# Patient Record
Sex: Female | Born: 1950 | Race: White | Hispanic: No | Smoking: Never smoker
Health system: Southern US, Community
[De-identification: ages and names within clinical notes are randomized; demographics above are authoritative.]

## PROBLEM LIST (undated history)

## (undated) DIAGNOSIS — J3089 Other allergic rhinitis: Secondary | ICD-10-CM

## (undated) DIAGNOSIS — G5600 Carpal tunnel syndrome, unspecified upper limb: Secondary | ICD-10-CM

## (undated) DIAGNOSIS — Z87442 Personal history of urinary calculi: Secondary | ICD-10-CM

## (undated) DIAGNOSIS — E0590 Thyrotoxicosis, unspecified without thyrotoxic crisis or storm: Secondary | ICD-10-CM

## (undated) DIAGNOSIS — R0609 Other forms of dyspnea: Secondary | ICD-10-CM

## (undated) DIAGNOSIS — N3281 Overactive bladder: Secondary | ICD-10-CM

## (undated) DIAGNOSIS — N393 Stress incontinence (female) (male): Secondary | ICD-10-CM

## (undated) DIAGNOSIS — N8110 Cystocele, unspecified: Secondary | ICD-10-CM

## (undated) DIAGNOSIS — Z9189 Other specified personal risk factors, not elsewhere classified: Secondary | ICD-10-CM

## (undated) DIAGNOSIS — M75122 Complete rotator cuff tear or rupture of left shoulder, not specified as traumatic: Secondary | ICD-10-CM

## (undated) DIAGNOSIS — E785 Hyperlipidemia, unspecified: Secondary | ICD-10-CM

## (undated) DIAGNOSIS — C4491 Basal cell carcinoma of skin, unspecified: Secondary | ICD-10-CM

## (undated) DIAGNOSIS — G8929 Other chronic pain: Secondary | ICD-10-CM

## (undated) DIAGNOSIS — Z9889 Other specified postprocedural states: Secondary | ICD-10-CM

## (undated) DIAGNOSIS — Z860100 Personal history of colon polyps, unspecified: Secondary | ICD-10-CM

## (undated) DIAGNOSIS — I10 Essential (primary) hypertension: Secondary | ICD-10-CM

## (undated) DIAGNOSIS — C801 Malignant (primary) neoplasm, unspecified: Secondary | ICD-10-CM

## (undated) DIAGNOSIS — I509 Heart failure, unspecified: Secondary | ICD-10-CM

## (undated) DIAGNOSIS — G5603 Carpal tunnel syndrome, bilateral upper limbs: Secondary | ICD-10-CM

## (undated) DIAGNOSIS — R112 Nausea with vomiting, unspecified: Secondary | ICD-10-CM

## (undated) DIAGNOSIS — G43909 Migraine, unspecified, not intractable, without status migrainosus: Secondary | ICD-10-CM

## (undated) DIAGNOSIS — I499 Cardiac arrhythmia, unspecified: Secondary | ICD-10-CM

## (undated) DIAGNOSIS — I428 Other cardiomyopathies: Secondary | ICD-10-CM

## (undated) DIAGNOSIS — K219 Gastro-esophageal reflux disease without esophagitis: Secondary | ICD-10-CM

## (undated) DIAGNOSIS — N390 Urinary tract infection, site not specified: Secondary | ICD-10-CM

## (undated) DIAGNOSIS — J189 Pneumonia, unspecified organism: Secondary | ICD-10-CM

## (undated) DIAGNOSIS — D649 Anemia, unspecified: Secondary | ICD-10-CM

## (undated) DIAGNOSIS — T7840XA Allergy, unspecified, initial encounter: Secondary | ICD-10-CM

## (undated) DIAGNOSIS — I5020 Unspecified systolic (congestive) heart failure: Secondary | ICD-10-CM

## (undated) DIAGNOSIS — N952 Postmenopausal atrophic vaginitis: Secondary | ICD-10-CM

## (undated) DIAGNOSIS — K5790 Diverticulosis of intestine, part unspecified, without perforation or abscess without bleeding: Secondary | ICD-10-CM

## (undated) DIAGNOSIS — I2510 Atherosclerotic heart disease of native coronary artery without angina pectoris: Secondary | ICD-10-CM

## (undated) DIAGNOSIS — M1990 Unspecified osteoarthritis, unspecified site: Secondary | ICD-10-CM

## (undated) DIAGNOSIS — I4891 Unspecified atrial fibrillation: Secondary | ICD-10-CM

## (undated) DIAGNOSIS — M7582 Other shoulder lesions, left shoulder: Secondary | ICD-10-CM

## (undated) DIAGNOSIS — K449 Diaphragmatic hernia without obstruction or gangrene: Secondary | ICD-10-CM

## (undated) DIAGNOSIS — C4492 Squamous cell carcinoma of skin, unspecified: Secondary | ICD-10-CM

## (undated) DIAGNOSIS — R0600 Dyspnea, unspecified: Secondary | ICD-10-CM

## (undated) DIAGNOSIS — N814 Uterovaginal prolapse, unspecified: Secondary | ICD-10-CM

## (undated) HISTORY — PX: HYSTEROSCOPY: SHX211

## (undated) HISTORY — PX: OOPHORECTOMY: SHX86

## (undated) HISTORY — DX: Personal history of colon polyps, unspecified: Z86.0100

## (undated) HISTORY — PX: EYE SURGERY: SHX253

## (undated) HISTORY — DX: Essential (primary) hypertension: I10

## (undated) HISTORY — PX: CATARACT EXTRACTION: SUR2

## (undated) HISTORY — DX: Hyperlipidemia, unspecified: E78.5

## (undated) HISTORY — PX: CARPAL TUNNEL RELEASE: SHX101

## (undated) HISTORY — DX: Personal history of colonic polyps: Z86.010

## (undated) HISTORY — DX: Migraine, unspecified, not intractable, without status migrainosus: G43.909

## (undated) HISTORY — PX: OTHER SURGICAL HISTORY: SHX169

## (undated) HISTORY — PX: TRIGGER FINGER RELEASE: SHX641

## (undated) HISTORY — PX: TOTAL SHOULDER REPLACEMENT: SUR1217

## (undated) HISTORY — PX: TONSILLECTOMY: SUR1361

## (undated) HISTORY — DX: Allergy, unspecified, initial encounter: T78.40XA

## (undated) HISTORY — PX: FINGER SURGERY: SHX640

## (undated) HISTORY — PX: TUBAL LIGATION: SHX77

## (undated) HISTORY — PX: VAGINAL HYSTERECTOMY: SUR661

---

## 2004-08-28 HISTORY — PX: CHOLECYSTECTOMY: SHX55

## 2004-09-29 ENCOUNTER — Ambulatory Visit: Payer: Self-pay | Admitting: Unknown Physician Specialty

## 2004-11-15 ENCOUNTER — Ambulatory Visit: Payer: Self-pay | Admitting: Unknown Physician Specialty

## 2005-05-23 ENCOUNTER — Ambulatory Visit: Payer: Self-pay | Admitting: Unknown Physician Specialty

## 2005-06-14 ENCOUNTER — Ambulatory Visit: Payer: Self-pay | Admitting: General Surgery

## 2005-06-28 ENCOUNTER — Ambulatory Visit: Payer: Self-pay | Admitting: General Surgery

## 2005-10-24 ENCOUNTER — Ambulatory Visit: Payer: Self-pay | Admitting: Gastroenterology

## 2005-12-05 ENCOUNTER — Ambulatory Visit: Payer: Self-pay | Admitting: Unknown Physician Specialty

## 2005-12-14 ENCOUNTER — Ambulatory Visit: Payer: Self-pay | Admitting: Unknown Physician Specialty

## 2005-12-21 ENCOUNTER — Ambulatory Visit: Payer: Self-pay | Admitting: Unknown Physician Specialty

## 2006-12-12 ENCOUNTER — Ambulatory Visit: Payer: Self-pay | Admitting: Unknown Physician Specialty

## 2007-12-19 ENCOUNTER — Ambulatory Visit: Payer: Self-pay | Admitting: Unknown Physician Specialty

## 2008-12-29 ENCOUNTER — Ambulatory Visit: Payer: Self-pay | Admitting: Unknown Physician Specialty

## 2009-07-17 ENCOUNTER — Emergency Department: Payer: Self-pay | Admitting: Emergency Medicine

## 2010-01-11 ENCOUNTER — Ambulatory Visit: Payer: Self-pay | Admitting: Unknown Physician Specialty

## 2010-01-18 LAB — HM MAMMOGRAPHY

## 2010-10-31 ENCOUNTER — Ambulatory Visit: Payer: Self-pay | Admitting: Gastroenterology

## 2010-11-02 LAB — PATHOLOGY REPORT

## 2010-11-07 ENCOUNTER — Ambulatory Visit: Payer: Self-pay | Admitting: Unknown Physician Specialty

## 2010-11-18 ENCOUNTER — Ambulatory Visit: Payer: Self-pay | Admitting: Unknown Physician Specialty

## 2010-11-22 LAB — PATHOLOGY REPORT

## 2010-11-26 ENCOUNTER — Emergency Department: Payer: Self-pay | Admitting: Emergency Medicine

## 2011-01-19 ENCOUNTER — Ambulatory Visit: Payer: Self-pay | Admitting: Unknown Physician Specialty

## 2011-05-11 ENCOUNTER — Ambulatory Visit: Payer: Self-pay | Admitting: Unknown Physician Specialty

## 2011-07-11 LAB — LIPID PANEL
HDL: 58 mg/dL (ref 35–70)
LDL Cholesterol: 103 mg/dL
LDl/HDL Ratio: 3.1
Triglycerides: 98 mg/dL (ref 40–160)

## 2011-07-11 LAB — HEPATIC FUNCTION PANEL
Alkaline Phosphatase: 98 U/L (ref 25–125)
Bilirubin, Total: 0.4 mg/dL

## 2012-05-09 ENCOUNTER — Ambulatory Visit: Payer: Self-pay | Admitting: Family Medicine

## 2012-09-23 DIAGNOSIS — R339 Retention of urine, unspecified: Secondary | ICD-10-CM | POA: Insufficient documentation

## 2012-09-23 DIAGNOSIS — IMO0002 Reserved for concepts with insufficient information to code with codable children: Secondary | ICD-10-CM | POA: Insufficient documentation

## 2012-10-02 DIAGNOSIS — N3281 Overactive bladder: Secondary | ICD-10-CM | POA: Insufficient documentation

## 2012-10-02 DIAGNOSIS — N393 Stress incontinence (female) (male): Secondary | ICD-10-CM | POA: Insufficient documentation

## 2012-10-02 DIAGNOSIS — Z09 Encounter for follow-up examination after completed treatment for conditions other than malignant neoplasm: Secondary | ICD-10-CM | POA: Insufficient documentation

## 2012-10-02 DIAGNOSIS — G43909 Migraine, unspecified, not intractable, without status migrainosus: Secondary | ICD-10-CM | POA: Insufficient documentation

## 2012-10-18 HISTORY — PX: COLPOPEXY: SHX920

## 2012-10-18 HISTORY — PX: CYSTOURETHROSCOPY: SHX476

## 2012-10-18 HISTORY — PX: ANTERIOR AND POSTERIOR VAGINAL REPAIR W/ SACROSPINOUS LIGAMENT SUSPENSION: SUR6

## 2012-11-05 ENCOUNTER — Encounter: Payer: Self-pay | Admitting: Internal Medicine

## 2012-11-08 ENCOUNTER — Telehealth: Payer: Self-pay | Admitting: Internal Medicine

## 2012-11-08 ENCOUNTER — Encounter: Payer: Self-pay | Admitting: Internal Medicine

## 2012-11-08 ENCOUNTER — Ambulatory Visit (INDEPENDENT_AMBULATORY_CARE_PROVIDER_SITE_OTHER): Payer: BC Managed Care – PPO | Admitting: Internal Medicine

## 2012-11-08 ENCOUNTER — Encounter: Payer: Self-pay | Admitting: *Deleted

## 2012-11-08 VITALS — BP 102/70 | HR 84 | Temp 97.8°F | Ht 61.5 in | Wt 152.0 lb

## 2012-11-08 DIAGNOSIS — Z86010 Personal history of colonic polyps: Secondary | ICD-10-CM

## 2012-11-08 DIAGNOSIS — E7800 Pure hypercholesterolemia, unspecified: Secondary | ICD-10-CM

## 2012-11-08 LAB — COMPREHENSIVE METABOLIC PANEL
AST: 20 U/L (ref 0–37)
Albumin: 3.7 g/dL (ref 3.5–5.2)
Alkaline Phosphatase: 100 U/L (ref 39–117)
Chloride: 107 mEq/L (ref 96–112)
Potassium: 4.3 mEq/L (ref 3.5–5.1)
Sodium: 140 mEq/L (ref 135–145)
Total Protein: 7 g/dL (ref 6.0–8.3)

## 2012-11-08 LAB — CBC WITH DIFFERENTIAL/PLATELET
Eosinophils Absolute: 0.3 10*3/uL (ref 0.0–0.7)
Eosinophils Relative: 3.5 % (ref 0.0–5.0)
Lymphocytes Relative: 24.1 % (ref 12.0–46.0)
MCHC: 33.3 g/dL (ref 30.0–36.0)
MCV: 83.3 fl (ref 78.0–100.0)
Monocytes Absolute: 0.6 10*3/uL (ref 0.1–1.0)
Neutrophils Relative %: 65.8 % (ref 43.0–77.0)
Platelets: 306 10*3/uL (ref 150.0–400.0)
WBC: 9.9 10*3/uL (ref 4.5–10.5)

## 2012-11-08 LAB — LIPID PANEL
HDL: 43.4 mg/dL (ref >39.00)
LDL Cholesterol: 101 mg/dL — ABNORMAL HIGH (ref 0–99)
VLDL: 29 mg/dL (ref 0.0–40.0)

## 2012-11-08 NOTE — Telephone Encounter (Signed)
Noted  

## 2012-11-08 NOTE — Telephone Encounter (Signed)
At check-out pt wanted to make sure that on her medical release form Dr. Lorin Picket added [facility].  Pt states only Dr. Quillian Quince was listed on it and she needs records from [facility] as well.

## 2012-11-09 ENCOUNTER — Encounter: Payer: Self-pay | Admitting: Internal Medicine

## 2012-11-11 ENCOUNTER — Encounter: Payer: Self-pay | Admitting: Internal Medicine

## 2012-11-11 DIAGNOSIS — J309 Allergic rhinitis, unspecified: Secondary | ICD-10-CM | POA: Insufficient documentation

## 2012-11-11 NOTE — Progress Notes (Signed)
  Subjective:    [name], female    DOB: 02/13/1951, 62 y.o.   MRN: [id number]  HPI 62 year old female with past history of documented hypertension, hypercholesterolemia, allergic rhinitis and migraine headaches.  She comes in today to follow up on these issues as well as for a complete physical exam.  She was a former pt of Dr Lin Givens.  Has been seeing Dr Quillian Quince recently.  Had her physical 03/19/12.  Is seeing Dr Oliva Bustard.  Had a recent vaginal hysterectomy and bladder tack.  Ovaries left in place.  Using estrace cream.  Has follow up planned 11/25/12.  Takes a stool softener daily.  Also takes metamucil prn.  Bowels do relatively well on this regimen.  She does have a history of migraine headaches.  Has been to the headache clinic.  Stable.  Sees Dr Bluford Kaufmann.  Had a colonoscopy 01/30/12.  Has a history of colon polyps.  Had a stress echo recently.  States everything checked out fine.  Had her mammogram 05/09/12.     Past Medical History  Diagnosis Date  . Hyperlipidemia   . Migraines   . Hypertension   . Allergy   . Personal history of colonic polyps     Review of Systems Patient denies any lightheadedness or dizziness.  Has a history of migraine headaches as outlined.  Feels these are stable.   No chest pain, tightness or palpitations.  No increased shortness of breath, cough or congestion.  No nausea or vomiting.  No acid reflux.  No abdominal pain or cramping.  No bowel change, such as diarrhea,  BRBPR or melana.  Bowels stable on current regimen.  No urine change.  No vaginal issues.  Did well with her surgery.  Using estrace cream.      Objective:   Physical Exam 62 year old female in no acute distress.   HEENT:  Nares- clear.  Oropharynx - without lesions. NECK:  Supple.  Nontender.  No audible bruit.  HEART:  Appears to be regular. LUNGS:  No crackles or wheezing audible.  Respirations even and unlabored.  RADIAL PULSE:  Equal bilaterally.   ABDOMEN:  Soft,  nontender.  Bowel sounds present and normal.  No audible abdominal bruit.   EXTREMITIES:  No increased edema present.  DP pulses palpable and equal bilaterally.      NEURO:  No focal neurological deficits noted.  SKIN:  No rash.       Assessment & Plan:  DOCUMENTED HISTORY OF HYPERTENSION.  Pt denies.  Follow.  Check metabolic panel.   GYN.  Is s/p vaginal hysterectomy and bladder sling.  Doing well.  Continue to follow up with Dr Varney Baas.   CARDIOVASCULAR.  Asymptomatic.    HEALTH MAINTENANCE.  Had her physical in 7/13 (03/19/12).  Mammogram 05/09/12.  Colonoscopy 01/30/12.  Shingles vaccine 4/13.

## 2012-11-11 NOTE — Assessment & Plan Note (Signed)
On simvastatin.  Low cholesterol diet and exercise.  Check lipid panel and liver function.  

## 2012-11-11 NOTE — Assessment & Plan Note (Signed)
[facility]ed up at [facility].  Doing well on her current regimen.  Follow.   

## 2012-11-11 NOTE — Assessment & Plan Note (Signed)
Stable on current regimen.  Follow.   

## 2012-11-11 NOTE — Assessment & Plan Note (Signed)
Had a colonoscopy 01/30/12.  Per Dr Oh - due follow up 01/29/17.    

## 2012-11-12 ENCOUNTER — Telehealth: Payer: Self-pay | Admitting: *Deleted

## 2012-11-12 NOTE — Telephone Encounter (Signed)
Updated patients record

## 2012-11-12 NOTE — Telephone Encounter (Signed)
Patient info enter into record.

## 2012-12-02 ENCOUNTER — Encounter: Payer: Self-pay | Admitting: Internal Medicine

## 2012-12-03 ENCOUNTER — Telehealth: Payer: Self-pay | Admitting: Internal Medicine

## 2012-12-03 NOTE — Telephone Encounter (Signed)
Pt sent me a my  Chart message regarding getting a tetanus at her 03/13/13 appt.  Do we always have tetanus here.  If so, let her know - she should be able to get at her appt.  Thanks.

## 2012-12-03 NOTE — Telephone Encounter (Signed)
Patient notified

## 2012-12-21 ENCOUNTER — Encounter: Payer: Self-pay | Admitting: Internal Medicine

## 2012-12-23 ENCOUNTER — Telehealth: Payer: Self-pay | Admitting: Internal Medicine

## 2012-12-23 NOTE — Telephone Encounter (Signed)
I am ok to give her scopolamine patch (change q 72 hours) One box with no refills - if she desires.  Regarding her arm, I do not give injections in small joints.  If having pain and possibly needing injection - can set her up with ortho.  Thanks.

## 2012-12-24 MED ORDER — SCOPOLAMINE 1 MG/3DAYS TD PT72: 1.0000 | MEDICATED_PATCH | TRANSDERMAL | Status: DC

## 2012-12-24 NOTE — Telephone Encounter (Signed)
Rx sent in electronically, & pt would like to hold off on a Ortho referral. Pt also instructed to wash hands after each change.

## 2013-01-02 ENCOUNTER — Encounter: Payer: Self-pay | Admitting: Internal Medicine

## 2013-01-03 ENCOUNTER — Ambulatory Visit (INDEPENDENT_AMBULATORY_CARE_PROVIDER_SITE_OTHER): Payer: BC Managed Care – PPO | Admitting: Internal Medicine

## 2013-01-03 ENCOUNTER — Encounter: Payer: Self-pay | Admitting: Internal Medicine

## 2013-01-03 ENCOUNTER — Telehealth: Payer: Self-pay | Admitting: Internal Medicine

## 2013-01-03 VITALS — BP 120/80 | HR 74 | Temp 97.6°F | Ht 61.5 in | Wt 156.8 lb

## 2013-01-03 DIAGNOSIS — N390 Urinary tract infection, site not specified: Secondary | ICD-10-CM

## 2013-01-03 LAB — POCT URINALYSIS DIPSTICK
Bilirubin, UA: NEGATIVE
Ketones, UA: NEGATIVE

## 2013-01-03 MED ORDER — NITROFURANTOIN MONOHYD MACRO 100 MG PO CAPS: 100.0000 mg | ORAL_CAPSULE | Freq: Two times a day (BID) | ORAL | Status: DC

## 2013-01-03 NOTE — Telephone Encounter (Signed)
She can come in at 12:15 - give urine sample first and then will need to be seen.  Working in for this problem.

## 2013-01-03 NOTE — Telephone Encounter (Signed)
Patient is coming in at 12:15 and she knows she has to be seen by the provider as well.

## 2013-01-03 NOTE — Progress Notes (Signed)
  Subjective:    [name], female    DOB: 04/05/1951, 62 y.o.   MRN: [id number]  Urinary Frequency  Associated symptoms include frequency.   61 year old female with past history of documented hypertension, hypercholesterolemia, allergic rhinitis and migraine headaches.  She comes in today for an acute visit with concerns regarding a possible urinary tract infection.  Has seen Dr Oliva Bustard.  Had a recent vaginal hysterectomy and bladder tack.  Ovaries left in place.  She reports that two days ago, she noticed a little pink with wiping - after urination.  She also reports increased urinary frequency and increased pressure.  No back pain and no abdominal pain.  No nausea or vomiting.  No fever.  No vaginal discharge or itching.  Getting ready to fly.  Went on a trip last week as well.  Noticed that she felt a little more pressure in her left ear - with the flight.  No ear ache or increased ear pain.  No increased sinus congestion or drainage.      Past Medical History  Diagnosis Date  . Hyperlipidemia   . Migraines   . Hypertension   . Allergy   . Personal history of colonic polyps     Review of Systems  Genitourinary: Positive for frequency.  no sinus pressure or ear ache or fullness now.  No acute hearing change.  Does report the urinary symptoms as outlined.  No vaginal discharge.  No further hematuria.  No abdominal pain or cramping.  Does report some pressure.  No bowel change, such as diarrhea.      Objective:   Physical Exam  62 year old female in no acute distress.   HEENT:  Nares- clear.  Oropharynx - without lesions.  Right TM clear.  Left TM - no erythema.  Question of scarring.  No change with hearing on gross exam.   NECK:  Supple.  Nontender.    HEART:  Appears to be regular. LUNGS:  No crackles or wheezing audible.  Respirations even and unlabored.  ABDOMEN:  Soft, nontender.  Bowel sounds present and normal.  No audible abdominal bruit.   BACK:   Non tender.  No CVA tenderness.       Assessment & Plan:  UTI.  Urine dip has findings c/w a possible uti.  Will treat with macrobid 100mg  bid x 5 days.  Stay hydrated.  Follow.   GYN.  Is s/p vaginal hysterectomy and bladder sling.  Doing well.  Continue to follow up with Dr Varney Baas.   UPCOMING FLIGHT.  No symptoms now.  Ear changes as outlined.  Will have her use Flonase and afrin nasal spray as directed.  Follow.   HEALTH MAINTENANCE.  Had her physical in 7/13 (03/19/12).  Mammogram 05/09/12.  Colonoscopy 01/30/12.  Shingles vaccine 4/13.

## 2013-01-05 LAB — URINE CULTURE: Colony Count: >=100000

## 2013-01-06 ENCOUNTER — Encounter: Payer: Self-pay | Admitting: *Deleted

## 2013-01-06 ENCOUNTER — Other Ambulatory Visit: Payer: Self-pay | Admitting: Internal Medicine

## 2013-01-06 DIAGNOSIS — R319 Hematuria, unspecified: Secondary | ICD-10-CM

## 2013-01-06 NOTE — Progress Notes (Signed)
Order placed for f/u urinalysis 

## 2013-01-17 ENCOUNTER — Other Ambulatory Visit (INDEPENDENT_AMBULATORY_CARE_PROVIDER_SITE_OTHER): Payer: BC Managed Care – PPO

## 2013-01-17 DIAGNOSIS — R319 Hematuria, unspecified: Secondary | ICD-10-CM

## 2013-01-17 LAB — URINALYSIS, ROUTINE W REFLEX MICROSCOPIC
Nitrite: NEGATIVE
Urobilinogen, UA: 0.2 (ref 0.0–1.0)

## 2013-01-20 ENCOUNTER — Encounter: Payer: Self-pay | Admitting: Internal Medicine

## 2013-02-03 ENCOUNTER — Encounter: Payer: Self-pay | Admitting: Internal Medicine

## 2013-02-06 ENCOUNTER — Encounter: Payer: Self-pay | Admitting: Internal Medicine

## 2013-02-06 ENCOUNTER — Ambulatory Visit (INDEPENDENT_AMBULATORY_CARE_PROVIDER_SITE_OTHER): Payer: BC Managed Care – PPO | Admitting: Internal Medicine

## 2013-02-06 VITALS — BP 110/80 | HR 79 | Temp 97.7°F | Ht 61.5 in | Wt 154.5 lb

## 2013-02-06 DIAGNOSIS — R350 Frequency of micturition: Secondary | ICD-10-CM

## 2013-02-06 MED ORDER — NITROFURANTOIN MONOHYD MACRO 100 MG PO CAPS: 100.0000 mg | ORAL_CAPSULE | Freq: Two times a day (BID) | ORAL | Status: DC

## 2013-02-10 LAB — WET PREP BY MOLECULAR PROBE
Candida species: NEGATIVE
Gardnerella vaginalis: NEGATIVE
Trichomonas vaginosis: NEGATIVE

## 2013-02-12 ENCOUNTER — Telehealth: Payer: Self-pay | Admitting: Internal Medicine

## 2013-02-12 ENCOUNTER — Other Ambulatory Visit: Payer: Self-pay | Admitting: Internal Medicine

## 2013-02-12 ENCOUNTER — Encounter: Payer: Self-pay | Admitting: Internal Medicine

## 2013-02-12 DIAGNOSIS — R109 Unspecified abdominal pain: Secondary | ICD-10-CM

## 2013-02-12 DIAGNOSIS — R319 Hematuria, unspecified: Secondary | ICD-10-CM

## 2013-02-12 DIAGNOSIS — R350 Frequency of micturition: Secondary | ICD-10-CM

## 2013-02-12 NOTE — Progress Notes (Signed)
Subjective:    [name], female    DOB: [birth year], 62 y.o.   MRN: [id number]  Urinary Tract Infection  Associated symptoms include frequency.  Urinary Frequency  Associated symptoms include frequency.   62 year old female with past history of documented hypertension, hypercholesterolemia, allergic rhinitis and migraine headaches.  She comes in today for an acute visit with concerns regarding a possible urinary tract infection.  Has seen Dr Oliva Bustard.  Had a recent vaginal hysterectomy and bladder tack.  Ovaries left in place.  She reports that three days ago, she noticed increased urinary frequency.  Some lower abdominal pressure.  No nausea or vomiting.  No fever.  Some nocturia.  No vaginal discharge.       Past Medical History  Diagnosis Date  . Hyperlipidemia   . Migraines   . Hypertension   . Allergy   . Personal history of colonic polyps     Current Outpatient Prescriptions on File Prior to Visit  Medication Sig Dispense Refill  . calcium carbonate (OS-CAL) 600 MG TABS Take 600 mg by mouth daily.      Jennette Banker Sodium 30-100 MG CAPS Take 100 mg by mouth 2 (two) times daily.      . cholecalciferol (VITAMIN D) 1000 UNITS tablet Take 1,000 Units by mouth daily.      Marland Kitchen erythromycin ophthalmic ointment at bedtime.      Marland Kitchen estradiol (ESTRACE) 0.1 MG/GM vaginal cream Use as directed      . fexofenadine (ALLEGRA) 180 MG tablet Take 180 mg by mouth daily.      . Multiple Vitamins-Minerals ([employer]S SPECTRAVITE ADULT 50+) TABS Take 1 tablet by mouth daily.      . simvastatin (ZOCOR) 10 MG tablet Take 10 mg by mouth at bedtime.      . solifenacin (VESICARE) 10 MG tablet Take by mouth daily.      . SUMAtriptan (IMITREX) 100 MG tablet Take 100 mg by mouth every 2 (two) hours as needed for migraine (Take 1 tablet by mouth as directed at onset of headache, then may repeat in 2 hours if no relief.).      Marland Kitchen zonisamide (ZONEGRAN) 100 MG capsule Take 100 mg by  mouth daily. Take 2 capsules by mouth at bedtime      . ibuprofen (ADVIL,MOTRIN) 600 MG tablet Take 600 mg by mouth every 6 (six) hours as needed for pain.      Marland Kitchen scopolamine (TRANSDERM-SCOP) 1.5 MG Place 1 patch (1.5 mg total) onto the skin every 3 (three) days.  10 patch  0   No current facility-administered medications on file prior to visit.    Review of Systems  Genitourinary: Positive for frequency.  Does report the urinary symptoms as outlined.  Increased frequency and nocturia.  No vaginal discharge.  No hematuria.  Some lower abdominal pressure.  No bowel change, such as diarrhea.      Objective:   Physical Exam  62 year old female in no acute distress.    HEART:  Appears to be regular. LUNGS:  No crackles or wheezing audible.  Respirations even and unlabored.  ABDOMEN:  Soft, nontender.  Bowel sounds present and normal.  No audible abdominal bruit.   BACK:  Non tender.  No CVA tenderness.  GU:  Normal external genitalia.  Vaginal vault without lesions.  Could not appreciate any adnexal masses or tenderness.  KOH/wet prep sent.        Assessment & Plan:  UTI.  Urine dip revealed large leukocytes.  No blood.   Will treat with macrobid 100mg  bid x 5 days.  Stay hydrated.  Follow.    GYN.  Is s/p vaginal hysterectomy and bladder sling.  KOH/wet prep sent. Await results.  Given persistent intermittent symptoms, will refer back to see Dr Varney Baas for evaluation.   HEALTH MAINTENANCE.  Had her physical in 7/13 (03/19/12).  Mammogram 05/09/12.  Colonoscopy 01/30/12.  Shingles vaccine 4/13.

## 2013-02-12 NOTE — Progress Notes (Signed)
Order placed for f/u referral to Dr Higinio Roger.

## 2013-02-12 NOTE — Progress Notes (Signed)
Order placed for f/u urine and culture.  

## 2013-02-20 ENCOUNTER — Telehealth: Payer: Self-pay | Admitting: *Deleted

## 2013-02-20 ENCOUNTER — Other Ambulatory Visit (INDEPENDENT_AMBULATORY_CARE_PROVIDER_SITE_OTHER): Payer: BC Managed Care – PPO

## 2013-02-20 DIAGNOSIS — N390 Urinary tract infection, site not specified: Secondary | ICD-10-CM

## 2013-02-20 LAB — POCT URINALYSIS DIPSTICK
Nitrite, UA: NEGATIVE
Protein, UA: NEGATIVE
Spec Grav, UA: 1.025
Urobilinogen, UA: 0.2
pH, UA: 6.5

## 2013-02-20 NOTE — Telephone Encounter (Signed)
Patient came in today to drop off a urine specimen. She also dropped off a copy of her AVS from [facility] (Dr. Cathie Hoops). She wanted to inform you that she was taken off of her Vesicare & started on [place] XR (I have updated her medication list) Dr. Varney Baas would also like for [place] to forward any Urine test to her office. (Paperwork in Writer)

## 2013-02-20 NOTE — Telephone Encounter (Signed)
Please send her urine results to Dr Jabier Gauss.  See lab results.  Will add culture.

## 2013-02-21 NOTE — Telephone Encounter (Signed)
Faxed to Dr. Varney Baas office

## 2013-03-13 ENCOUNTER — Ambulatory Visit (INDEPENDENT_AMBULATORY_CARE_PROVIDER_SITE_OTHER): Payer: BC Managed Care – PPO | Admitting: Internal Medicine

## 2013-03-13 ENCOUNTER — Encounter: Payer: Self-pay | Admitting: Internal Medicine

## 2013-03-13 VITALS — BP 130/90 | HR 113 | Temp 98.3°F | Ht 61.0 in | Wt 159.0 lb

## 2013-03-13 DIAGNOSIS — J309 Allergic rhinitis, unspecified: Secondary | ICD-10-CM

## 2013-03-13 DIAGNOSIS — Z86010 Personal history of colonic polyps: Secondary | ICD-10-CM

## 2013-03-13 DIAGNOSIS — E7800 Pure hypercholesterolemia, unspecified: Secondary | ICD-10-CM

## 2013-03-13 DIAGNOSIS — Z8669 Personal history of other diseases of the nervous system and sense organs: Secondary | ICD-10-CM

## 2013-03-16 ENCOUNTER — Encounter: Payer: Self-pay | Admitting: Internal Medicine

## 2013-03-16 NOTE — Assessment & Plan Note (Signed)
Stable on current regimen.  Follow.   

## 2013-03-16 NOTE — Progress Notes (Signed)
Subjective:    [name], female    DOB: 1950-11-15, 62 y.o.   MRN: [id number]  HPI 62 year old female with past history of documented hypertension, hypercholesterolemia, allergic rhinitis and migraine headaches.  She comes in today to follow up on these issues as well as for a complete physical exam.   Is seeing Dr Oliva Bustard.  Had a recent vaginal hysterectomy and bladder tack.  Ovaries left in place.  Using estrace cream.  Has had some issues recently with urinary tract infections and vaginal/bladder discomfort.  Just evaluated by Dr Varney Baas.  Gave her exercises to do.  Takes a stool softener daily.  Also takes metamucil prn.  Bowels do relatively well on this regimen.  She does have a history of migraine headaches.  Has been to the headache clinic.  Stable.  Sees Dr Bluford Kaufmann.  Had a colonoscopy 01/30/12.  Has a history of colon polyps.  Had a stress echo recently.  States everything checked out fine.  Had her mammogram 05/09/12.  Overall she feels she is doing relatively well.     Past Medical History  Diagnosis Date  . Hyperlipidemia   . Migraines   . Hypertension   . Allergy   . Personal history of colonic polyps     Current Outpatient Prescriptions on File Prior to Visit  Medication Sig Dispense Refill  . calcium carbonate (OS-CAL) 600 MG TABS Take 600 mg by mouth daily.      Jennette Banker Sodium 30-100 MG CAPS Take 100 mg by mouth 2 (two) times daily.      . cholecalciferol (VITAMIN D) 1000 UNITS tablet Take 1,000 Units by mouth daily.      Marland Kitchen erythromycin ophthalmic ointment at bedtime.      Marland Kitchen estradiol (ESTRACE) 0.1 MG/GM vaginal cream Use as directed      . fexofenadine (ALLEGRA) 180 MG tablet Take 180 mg by mouth daily.      . Multiple Vitamins-Minerals (CVS SPECTRAVITE ADULT 50+) TABS Take 1 tablet by mouth daily.      . simvastatin (ZOCOR) 10 MG tablet Take 10 mg by mouth at bedtime.      . SUMAtriptan (IMITREX) 100 MG tablet Take 100 mg by mouth every  2 (two) hours as needed for migraine (Take 1 tablet by mouth as directed at onset of headache, then may repeat in 2 hours if no relief.).      . Trospium Chloride (SANCTURA XR) 60 MG CP24 Take by mouth daily before breakfast.      . zonisamide (ZONEGRAN) 100 MG capsule Take 100 mg by mouth daily. Take 2 capsules by mouth at bedtime       No current facility-administered medications on file prior to visit.    Review of Systems Patient denies any lightheadedness or dizziness.  Has a history of migraine headaches as outlined.  Feels these are stable.   No chest pain, tightness or palpitations.  No increased shortness of breath, cough or congestion.  No nausea or vomiting.  No acid reflux.  No abdominal pain or cramping.  No bowel change, such as diarrhea,  BRBPR or melana.  Bowels stable on current regimen.  No urine change now.  Previous infection symptoms resolved.   Having some pelvic issues.  Saw Dr Varney Baas.  Given exercises.  Using estrace cream.  Has followed up planned in 8/14.      Objective:   Physical Exam  Filed Vitals:   03/13/13 1334  BP: 130/90  Pulse: 113  Temp: 98.3 F (36.8 C)   Blood pressure recheck:  120/82, pulse 56  62 year old female in no acute distress.   HEENT:  Nares- clear.  Oropharynx - without lesions. NECK:  Supple.  Nontender.  No audible bruit.  HEART:  Appears to be regular. LUNGS:  No crackles or wheezing audible.  Respirations even and unlabored.  RADIAL PULSE:  Equal bilaterally.    BREASTS:  No nipple discharge or nipple retraction present.  Could not appreciate any distinct nodules or axillary adenopathy.  ABDOMEN:  Soft, nontender.  Bowel sounds present and normal.  No audible abdominal bruit.  GU:  Normal external genitalia.  Vaginal vault without lesions.  Some prolapse present.  Could not appreciate any adnexal masses or tenderness.   RECTAL:  Heme negative.   EXTREMITIES:  No increased edema present.  DP pulses palpable and equal bilaterally.           Assessment & Plan:  DOCUMENTED HISTORY OF HYPERTENSION.  Pt denies.  Blood pressure recheck 120/82.  Follow.   GYN.  Is s/p vaginal hysterectomy and bladder sling.  Continue to follow up with Dr Varney Baas.  Symptoms as outlined.  Was just reevaluated.  Given exercises.  Planning for f/u in 8/14.   CARDIOVASCULAR.  Asymptomatic.    HEALTH MAINTENANCE.  Physical today.   Mammogram 05/09/12.  Colonoscopy 01/30/12.  Shingles vaccine 4/13.

## 2013-03-16 NOTE — Assessment & Plan Note (Signed)
Had a colonoscopy 01/30/12.  Per Dr Oh - due follow up 01/29/17.    

## 2013-03-16 NOTE — Assessment & Plan Note (Signed)
On simvastatin.  Low cholesterol diet and exercise.  Follow lipid panel and liver function.      

## 2013-03-16 NOTE — Assessment & Plan Note (Signed)
[facility]ed up at [facility].  Doing well on her current regimen.  Follow.   

## 2013-04-04 ENCOUNTER — Other Ambulatory Visit: Payer: Self-pay | Admitting: *Deleted

## 2013-04-04 ENCOUNTER — Encounter: Payer: Self-pay | Admitting: Internal Medicine

## 2013-04-04 MED ORDER — SUMATRIPTAN SUCCINATE 100 MG PO TABS: 100.0000 mg | ORAL_TABLET | ORAL | Status: DC | PRN

## 2013-04-08 ENCOUNTER — Telehealth: Payer: Self-pay | Admitting: Internal Medicine

## 2013-04-08 MED ORDER — SUMATRIPTAN SUCCINATE 100 MG PO TABS: 100.0000 mg | ORAL_TABLET | ORAL | Status: DC | PRN

## 2013-04-08 NOTE — Telephone Encounter (Signed)
Faxed new Rx & sent pt a mychart message

## 2013-04-08 NOTE — Telephone Encounter (Signed)
Pt states sumatriptan was called in to [facility] but was only called for 1 month.  Pt states they usually get a year supply with 3 mth refills each time as they drive back and forth to [facility] for this.  Please contact pt once this has been corrected.

## 2013-04-16 ENCOUNTER — Telehealth: Payer: Self-pay | Admitting: Emergency Medicine

## 2013-04-16 NOTE — Telephone Encounter (Signed)
LVM for patient to call our office in reference to dermatology apt.

## 2013-05-06 ENCOUNTER — Other Ambulatory Visit: Payer: BC Managed Care – PPO

## 2013-05-09 ENCOUNTER — Encounter: Payer: Self-pay | Admitting: Internal Medicine

## 2013-05-09 ENCOUNTER — Other Ambulatory Visit: Payer: Self-pay | Admitting: *Deleted

## 2013-05-09 MED ORDER — SIMVASTATIN 10 MG PO TABS: 10.0000 mg | ORAL_TABLET | Freq: Every day | ORAL | Status: DC

## 2013-05-16 ENCOUNTER — Other Ambulatory Visit: Payer: Self-pay | Admitting: *Deleted

## 2013-05-16 MED ORDER — SUMATRIPTAN SUCCINATE 100 MG PO TABS: 100.0000 mg | ORAL_TABLET | ORAL | Status: DC | PRN

## 2013-05-19 ENCOUNTER — Other Ambulatory Visit: Payer: Self-pay | Admitting: *Deleted

## 2013-05-19 NOTE — Telephone Encounter (Signed)
Rx printed on 05/16/13 was not picked up (was printed in error)

## 2013-05-20 ENCOUNTER — Ambulatory Visit: Payer: Self-pay | Admitting: Internal Medicine

## 2013-05-20 LAB — HM MAMMOGRAPHY

## 2013-05-21 ENCOUNTER — Encounter: Payer: Self-pay | Admitting: Internal Medicine

## 2013-06-06 ENCOUNTER — Other Ambulatory Visit (INDEPENDENT_AMBULATORY_CARE_PROVIDER_SITE_OTHER): Payer: BC Managed Care – PPO

## 2013-06-06 DIAGNOSIS — E7800 Pure hypercholesterolemia, unspecified: Secondary | ICD-10-CM

## 2013-06-06 LAB — LIPID PANEL
Cholesterol: 159 mg/dL (ref 0–200)
LDL Cholesterol: 86 mg/dL (ref 0–99)

## 2013-06-06 LAB — HEPATIC FUNCTION PANEL
ALT: 18 U/L (ref 0–35)
Alkaline Phosphatase: 104 U/L (ref 39–117)
Bilirubin, Direct: 0 mg/dL (ref 0.0–0.3)
Total Protein: 7.3 g/dL (ref 6.0–8.3)

## 2013-06-07 ENCOUNTER — Encounter: Payer: Self-pay | Admitting: Internal Medicine

## 2013-07-03 ENCOUNTER — Other Ambulatory Visit: Payer: Self-pay

## 2013-08-18 ENCOUNTER — Ambulatory Visit (INDEPENDENT_AMBULATORY_CARE_PROVIDER_SITE_OTHER): Payer: BC Managed Care – PPO | Admitting: Internal Medicine

## 2013-08-18 ENCOUNTER — Encounter: Payer: Self-pay | Admitting: Internal Medicine

## 2013-08-18 VITALS — BP 130/80 | HR 90 | Temp 97.9°F | Ht 61.0 in | Wt 159.5 lb

## 2013-08-18 DIAGNOSIS — N390 Urinary tract infection, site not specified: Secondary | ICD-10-CM

## 2013-08-18 LAB — POCT URINALYSIS DIPSTICK
Bilirubin, UA: NEGATIVE
Glucose, UA: NEGATIVE
Protein, UA: NEGATIVE
Spec Grav, UA: 1.02
Urobilinogen, UA: 0.2

## 2013-08-18 MED ORDER — CIPROFLOXACIN HCL 500 MG PO TABS: 500.0000 mg | ORAL_TABLET | Freq: Two times a day (BID) | ORAL | Status: DC

## 2013-08-18 NOTE — Progress Notes (Signed)
Pre-visit discussion using our clinic review tool. No additional management support is needed unless otherwise documented below in the visit note.  

## 2013-08-18 NOTE — Telephone Encounter (Signed)
Left message, requesting pt to call back to confirm she can be seen at 12:15.

## 2013-08-18 NOTE — Telephone Encounter (Signed)
Pt called back and confirmed she can keep 12:15

## 2013-08-20 LAB — URINE CULTURE: Colony Count: >=100000

## 2013-08-22 ENCOUNTER — Encounter: Payer: Self-pay | Admitting: Internal Medicine

## 2013-08-22 DIAGNOSIS — N390 Urinary tract infection, site not specified: Secondary | ICD-10-CM | POA: Insufficient documentation

## 2013-08-22 NOTE — Assessment & Plan Note (Signed)
Symptoms and urinary symptoms c/w uti.  Treat with cipro as directed.  Stay hydrated.  Notify me or be reevaluated if symptoms change, worsen or do not resolve.

## 2013-08-22 NOTE — Progress Notes (Signed)
  Subjective:    [name], female    DOB: [birth year], 62 y.o.   MRN: [id number]  Urinary Tract Infection   62 year old female with past history of documented hypertension, hypercholesterolemia, allergic rhinitis and migraine headaches.  She comes in today as a work in with concerns regarding a possible urinary tract infection.  Had a recent vaginal hysterectomy and bladder tack.  Ovaries left in place.  States symptoms started two days ago.  Describes increased lower abdominal pressure.  Some minimal low back discomfort.  Cloudy urine.  No hematuria.  No vaginal discharge or fever.  Eating and drinking well.  No nausea or vomiting.      Past Medical History  Diagnosis Date  . Hyperlipidemia   . Migraines   . Hypertension   . Allergy   . Personal history of colonic polyps     Current Outpatient Prescriptions on File Prior to Visit  Medication Sig Dispense Refill  . calcium carbonate (OS-CAL) 600 MG TABS Take 600 mg by mouth daily.      Jennette Banker Sodium 30-100 MG CAPS Take 100 mg by mouth 2 (two) times daily.      . cholecalciferol (VITAMIN D) 1000 UNITS tablet Take 1,000 Units by mouth daily.      . Cranberry-Vitamin C-Probiotic (AZO CRANBERRY PO) Take by mouth.      . erythromycin ophthalmic ointment at bedtime.      Marland Kitchen estradiol (ESTRACE) 0.1 MG/GM vaginal cream Use as directed      . fexofenadine (ALLEGRA) 180 MG tablet Take 180 mg by mouth daily.      . Multiple Vitamins-Minerals (CVS SPECTRAVITE ADULT 50+) TABS Take 1 tablet by mouth daily.      . Probiotic Product (PROBIOTIC DAILY PO) Take by mouth.      . simvastatin (ZOCOR) 10 MG tablet Take 1 tablet (10 mg total) by mouth at bedtime.  30 tablet  5  . SUMAtriptan (IMITREX) 100 MG tablet Take 1 tablet (100 mg total) by mouth every 2 (two) hours as needed for migraine (Take 1 tablet by mouth as directed at onset of headache, then may repeat in 2 hours if no relief.).  30 tablet  0  . Trospium  Chloride (SANCTURA XR) 60 MG CP24 Take by mouth daily before breakfast.      . zonisamide (ZONEGRAN) 100 MG capsule Take 100 mg by mouth daily. Take 2 capsules by mouth at bedtime       No current facility-administered medications on file prior to visit.    Review of Systems No abdominal pain or cramping.  Does report the abdominal pressure.  No bowel change.  Bowels stable.  Urine cloudy.  Symptoms c/w uti symptoms.  No vaginal discharge.  No fever.  Eating and drinking well.       Objective:   Physical Exam  Filed Vitals:   08/18/13 1227  BP: 130/80  Pulse: 90  Temp: 97.9 F (57.87 C)   61 year old female in no acute distress.  HEART:  Appears to be regular. LUNGS:  No crackles or wheezing audible.  Respirations even and unlabored. ABDOMEN:  Soft, nontender.  Bowel sounds present and normal.   BACK:  Nnon tender.  No CVA tenderness.           Assessment & Plan:  GYN.  Is s/p vaginal hysterectomy and bladder sling.  Continue to follow up with Dr Varney Baas.

## 2013-09-17 ENCOUNTER — Other Ambulatory Visit (INDEPENDENT_AMBULATORY_CARE_PROVIDER_SITE_OTHER): Payer: BC Managed Care – PPO

## 2013-09-17 ENCOUNTER — Encounter: Payer: Self-pay | Admitting: Internal Medicine

## 2013-09-17 ENCOUNTER — Ambulatory Visit (INDEPENDENT_AMBULATORY_CARE_PROVIDER_SITE_OTHER): Payer: BC Managed Care – PPO | Admitting: Internal Medicine

## 2013-09-17 ENCOUNTER — Telehealth: Payer: Self-pay | Admitting: Internal Medicine

## 2013-09-17 ENCOUNTER — Ambulatory Visit (INDEPENDENT_AMBULATORY_CARE_PROVIDER_SITE_OTHER)
Admission: RE | Admit: 2013-09-17 | Discharge: 2013-09-17 | Disposition: A | Discharge status: Home / Self Care | Payer: BC Managed Care – PPO | Source: Ambulatory Visit | Attending: Internal Medicine | Admitting: Internal Medicine

## 2013-09-17 ENCOUNTER — Other Ambulatory Visit: Payer: Self-pay | Admitting: Internal Medicine

## 2013-09-17 ENCOUNTER — Ambulatory Visit
Admission: RE | Admit: 2013-09-17 | Discharge: 2013-09-17 | Disposition: A | Discharge status: Home / Self Care | Payer: BC Managed Care – PPO | Source: Ambulatory Visit | Attending: Internal Medicine | Admitting: Internal Medicine

## 2013-09-17 VITALS — BP 120/80 | HR 71 | Temp 97.9°F | Wt 160.5 lb

## 2013-09-17 DIAGNOSIS — R7989 Other specified abnormal findings of blood chemistry: Secondary | ICD-10-CM

## 2013-09-17 DIAGNOSIS — Z8669 Personal history of other diseases of the nervous system and sense organs: Secondary | ICD-10-CM

## 2013-09-17 DIAGNOSIS — E7800 Pure hypercholesterolemia, unspecified: Secondary | ICD-10-CM

## 2013-09-17 DIAGNOSIS — M542 Cervicalgia: Secondary | ICD-10-CM

## 2013-09-17 DIAGNOSIS — Z86010 Personal history of colonic polyps: Secondary | ICD-10-CM

## 2013-09-17 DIAGNOSIS — M79609 Pain in unspecified limb: Secondary | ICD-10-CM

## 2013-09-17 DIAGNOSIS — R946 Abnormal results of thyroid function studies: Secondary | ICD-10-CM

## 2013-09-17 DIAGNOSIS — J309 Allergic rhinitis, unspecified: Secondary | ICD-10-CM

## 2013-09-17 DIAGNOSIS — R748 Abnormal levels of other serum enzymes: Secondary | ICD-10-CM

## 2013-09-17 DIAGNOSIS — M79601 Pain in right arm: Secondary | ICD-10-CM

## 2013-09-17 DIAGNOSIS — M79603 Pain in arm, unspecified: Secondary | ICD-10-CM

## 2013-09-17 DIAGNOSIS — R319 Hematuria, unspecified: Secondary | ICD-10-CM

## 2013-09-17 DIAGNOSIS — IMO0002 Reserved for concepts with insufficient information to code with codable children: Secondary | ICD-10-CM

## 2013-09-17 DIAGNOSIS — N9989 Other postprocedural complications and disorders of genitourinary system: Secondary | ICD-10-CM

## 2013-09-17 LAB — COMPREHENSIVE METABOLIC PANEL
ALBUMIN: 3.8 g/dL (ref 3.5–5.2)
ALT: 16 U/L (ref 0–35)
AST: 24 U/L (ref 0–37)
Alkaline Phosphatase: 124 U/L — ABNORMAL HIGH (ref 39–117)
BUN: 14 mg/dL (ref 6–23)
CHLORIDE: 108 meq/L (ref 96–112)
CO2: 27 mEq/L (ref 19–32)
Calcium: 9.6 mg/dL (ref 8.4–10.5)
Creatinine, Ser: 0.8 mg/dL (ref 0.4–1.2)
GFR: 74.34 mL/min (ref >60.00)
Glucose, Bld: 94 mg/dL (ref 70–99)
POTASSIUM: 4.4 meq/L (ref 3.5–5.1)
Sodium: 140 mEq/L (ref 135–145)
Total Bilirubin: 0.6 mg/dL (ref 0.3–1.2)
Total Protein: 7 g/dL (ref 6.0–8.3)

## 2013-09-17 LAB — URINALYSIS, ROUTINE W REFLEX MICROSCOPIC
BILIRUBIN URINE: NEGATIVE
KETONES UR: NEGATIVE
Nitrite: NEGATIVE
PH: 7.5 (ref 5.0–8.0)
Specific Gravity, Urine: 1.02 (ref 1.000–1.030)
Total Protein, Urine: NEGATIVE
Urine Glucose: NEGATIVE
Urobilinogen, UA: 0.2 (ref 0.0–1.0)

## 2013-09-17 LAB — TSH: TSH: 0.14 u[IU]/mL — ABNORMAL LOW (ref 0.35–5.50)

## 2013-09-17 LAB — LIPID PANEL
CHOL/HDL RATIO: 4
Cholesterol: 173 mg/dL (ref 0–200)
HDL: 49.4 mg/dL (ref >39.00)
LDL CALC: 92 mg/dL (ref 0–99)
Triglycerides: 160 mg/dL — ABNORMAL HIGH (ref 0.0–149.0)
VLDL: 32 mg/dL (ref 0.0–40.0)

## 2013-09-17 LAB — T3, FREE: T3, Free: 3.5 pg/mL (ref 2.3–4.2)

## 2013-09-17 LAB — T4, FREE: Free T4: 0.71 ng/dL (ref 0.60–1.60)

## 2013-09-17 MED ORDER — SUMATRIPTAN SUCCINATE 100 MG PO TABS: 100.0000 mg | ORAL_TABLET | ORAL | Status: DC | PRN

## 2013-09-17 NOTE — Progress Notes (Signed)
Orders placed for f/u labs.  

## 2013-09-17 NOTE — Progress Notes (Signed)
Orders placed for labs

## 2013-09-17 NOTE — Progress Notes (Signed)
Subjective:    [name], female    DOB: 26-May-1951, 63 y.o.   MRN: [id number]  HPI 63 year old female with past history of documented hypertension, hypercholesterolemia, allergic rhinitis and migraine headaches.  She comes in today for a scheduled follow up.   Has been seeing Dr Doreatha Lew.  Had a recent vaginal hysterectomy and bladder tack.  Ovaries left in place.  Using estrace cream.  Has had some issues recently with urinary tract infections and vaginal/bladder discomfort.  Just evaluated by Dr Dennard Schaumann.  Gave her exercises to do.  Takes a stool softener daily.  Also takes metamucil prn.  Bowels do relatively well on this regimen.  She does have a history of migraine headaches.  Has been to the headache clinic.  Stable.  Sees Dr Candace Cruise.  Had a colonoscopy 01/30/12.  Has a history of colon polyps.  Had a stress echo recently.  States everything checked out fine.  She is having more problems with her bladder.  Has been persistent.  Desires a second opinion.  Wants to see Dr Nicki Reaper Mcdiarmid.   Is also having problems with right neck and right shoulder pain.  When she turns her head, has a pulling sensation in her neck.  Increased pain down her right arm and right hand numbness.      Past Medical History  Diagnosis Date  . Hyperlipidemia   . Migraines   . Hypertension   . Allergy   . Personal history of colonic polyps     Current Outpatient Prescriptions on File Prior to Visit  Medication Sig Dispense Refill  . calcium carbonate (OS-CAL) 600 MG TABS Take 600 mg by mouth daily.      Sarajane Marek Sodium 30-100 MG CAPS Take 100 mg by mouth 2 (two) times daily.      . cholecalciferol (VITAMIN D) 1000 UNITS tablet Take 1,000 Units by mouth daily.      . ciprofloxacin (CIPRO) 500 MG tablet Take 1 tablet (500 mg total) by mouth 2 (two) times daily.  10 tablet  0  . Cranberry-Vitamin C-Probiotic (AZO CRANBERRY PO) Take by mouth.      . erythromycin ophthalmic ointment at  bedtime.      Marland Kitchen estradiol (ESTRACE) 0.1 MG/GM vaginal cream Use as directed      . fexofenadine (ALLEGRA) 180 MG tablet Take 180 mg by mouth daily.      . Multiple Vitamins-Minerals ([employer]S SPECTRAVITE ADULT 50+) TABS Take 1 tablet by mouth daily.      . Probiotic Product (PROBIOTIC DAILY PO) Take by mouth.      . simvastatin (ZOCOR) 10 MG tablet Take 1 tablet (10 mg total) by mouth at bedtime.  30 tablet  5  . SUMAtriptan (IMITREX) 100 MG tablet Take 1 tablet (100 mg total) by mouth every 2 (two) hours as needed for migraine (Take 1 tablet by mouth as directed at onset of headache, then may repeat in 2 hours if no relief.).  30 tablet  0  . Trospium Chloride (SANCTURA XR) 60 MG CP24 Take by mouth daily before breakfast.      . zonisamide (ZONEGRAN) 100 MG capsule Take 100 mg by mouth daily. Take 2 capsules by mouth at bedtime       No current facility-administered medications on file prior to visit.    Review of Systems Patient denies any lightheadedness or dizziness.  Has a history of migraine headaches as outlined.  Feels these are stable.  No chest pain, tightness or palpitations.  No increased shortness of breath, cough or congestion.  No nausea or vomiting.  No acid reflux.  No abdominal pain or cramping.  No bowel change, such as diarrhea,  BRBPR or melana.  Bowels stable on current regimen.   Having some pelvic issues.  Saw Dr Dennard Schaumann.  Given exercises.  Using estrace cream.  Persistent problems.  Prefers to have a second opinion.       Objective:   Physical Exam  Filed Vitals:   09/17/13 0802  BP: 120/80  Pulse: 71  Temp: 97.9 F (12.67 C)   63 year old female in no acute distress.   HEENT:  Nares- clear.  Oropharynx - without lesions. NECK:  Supple.  Nontender.  No audible bruit.  HEART:  Appears to be regular. LUNGS:  No crackles or wheezing audible.  Respirations even and unlabored.  RADIAL PULSE:  Equal bilaterally.  ABDOMEN:  Soft, nontender.  Bowel sounds present and  normal.  No audible abdominal bruit.   EXTREMITIES:  No increased edema present.  DP pulses palpable and equal bilaterally.          Assessment & Plan:  DOCUMENTED HISTORY OF HYPERTENSION.  Pt denies.  Blood pressure doing well.  Follow.   GYN.  Is s/p vaginal hysterectomy and bladder sling.  Has been seeing Dr Dennard Schaumann.  Symptoms as outlined.  Request appt with Dr Nicki Reaper Mcdiarmid.    CARDIOVASCULAR.  Asymptomatic.    HEALTH MAINTENANCE.  Physical 03/13/13.   Mammogram 05/20/13 birads I. .  Colonoscopy 01/30/12.  Shingles vaccine 4/13.

## 2013-09-17 NOTE — Progress Notes (Signed)
Pre-visit discussion using our clinic review tool. No additional management support is needed unless otherwise documented below in the visit note.  

## 2013-09-17 NOTE — Telephone Encounter (Signed)
Pt notified of lab results and need for f/u lab in one month.  Please schedule her for a non fasting lab appt in one month and contact her with a lab appt date and time.  Thanks.

## 2013-09-17 NOTE — Progress Notes (Signed)
Order placed for free t3 and free t4 

## 2013-09-18 ENCOUNTER — Encounter: Payer: Self-pay | Admitting: Emergency Medicine

## 2013-09-18 LAB — CULTURE, URINE COMPREHENSIVE
Colony Count: NO GROWTH
Organism ID, Bacteria: NO GROWTH

## 2013-09-18 NOTE — Telephone Encounter (Signed)
Appointment 2/23  Sent my chart message letting pt know date and time

## 2013-09-18 NOTE — Telephone Encounter (Signed)
MRI ordered.  Pt wants after 5:00   Thanks.

## 2013-09-19 ENCOUNTER — Encounter: Payer: Self-pay | Admitting: Internal Medicine

## 2013-09-21 ENCOUNTER — Encounter: Payer: Self-pay | Admitting: Internal Medicine

## 2013-09-21 DIAGNOSIS — M542 Cervicalgia: Secondary | ICD-10-CM | POA: Insufficient documentation

## 2013-09-21 NOTE — Assessment & Plan Note (Signed)
On simvastatin.  Low cholesterol diet and exercise.  Follow lipid panel and liver function.      

## 2013-09-21 NOTE — Assessment & Plan Note (Signed)
Stable on current regimen.  Follow.   

## 2013-09-21 NOTE — Assessment & Plan Note (Signed)
Neck and arm pain.  Persistent.  Check C-spine xray.  May need mri given radicular symptoms.  Follow.

## 2013-09-21 NOTE — Assessment & Plan Note (Signed)
[facility]ed up at [facility].  Doing well on her current regimen.  Follow.   

## 2013-09-21 NOTE — Assessment & Plan Note (Signed)
Had a colonoscopy 01/30/12.  Per Dr Oh - due follow up 01/29/17.    

## 2013-09-27 ENCOUNTER — Ambulatory Visit: Payer: Self-pay | Admitting: Internal Medicine

## 2013-09-30 ENCOUNTER — Telehealth: Payer: Self-pay | Admitting: Internal Medicine

## 2013-09-30 DIAGNOSIS — M542 Cervicalgia: Secondary | ICD-10-CM

## 2013-09-30 NOTE — Telephone Encounter (Signed)
Pt notified of mri results via my chart.  To let me know if agrees to neurosurgery referral.

## 2013-10-14 NOTE — Telephone Encounter (Signed)
Order placed for neurosurgery referral 

## 2013-10-14 NOTE — Addendum Note (Signed)
Addended by: Alisa Graff on: 10/14/2013 12:59 PM   Modules accepted: Orders

## 2013-10-20 ENCOUNTER — Other Ambulatory Visit (INDEPENDENT_AMBULATORY_CARE_PROVIDER_SITE_OTHER): Payer: BC Managed Care – PPO

## 2013-10-20 DIAGNOSIS — R946 Abnormal results of thyroid function studies: Secondary | ICD-10-CM

## 2013-10-20 DIAGNOSIS — R7989 Other specified abnormal findings of blood chemistry: Secondary | ICD-10-CM

## 2013-10-20 DIAGNOSIS — R748 Abnormal levels of other serum enzymes: Secondary | ICD-10-CM

## 2013-10-20 LAB — TSH: TSH: 0.19 u[IU]/mL — ABNORMAL LOW (ref 0.35–5.50)

## 2013-10-20 LAB — T4, FREE: Free T4: 0.68 ng/dL (ref 0.60–1.60)

## 2013-10-20 LAB — ALKALINE PHOSPHATASE: Alkaline Phosphatase: 108 U/L (ref 39–117)

## 2013-10-20 LAB — T3, FREE: T3, Free: 3 pg/mL (ref 2.3–4.2)

## 2013-10-21 ENCOUNTER — Telehealth: Payer: Self-pay | Admitting: Internal Medicine

## 2013-10-21 ENCOUNTER — Encounter: Payer: Self-pay | Admitting: Internal Medicine

## 2013-10-21 DIAGNOSIS — R7989 Other specified abnormal findings of blood chemistry: Secondary | ICD-10-CM

## 2013-10-21 NOTE — Telephone Encounter (Signed)
Pt notified of lab results via my chart.  Needs a non fasting lab appt in 6 weeks.  Please contact her with a lab appt date and time.  Thanks.

## 2013-10-22 NOTE — Telephone Encounter (Signed)
Sent my chart message letting pt know about appointment date and time 4/8

## 2013-10-27 ENCOUNTER — Other Ambulatory Visit: Payer: Self-pay | Admitting: Internal Medicine

## 2013-11-23 ENCOUNTER — Other Ambulatory Visit: Payer: Self-pay | Admitting: Internal Medicine

## 2013-12-03 ENCOUNTER — Other Ambulatory Visit: Payer: BC Managed Care – PPO

## 2013-12-03 ENCOUNTER — Other Ambulatory Visit (INDEPENDENT_AMBULATORY_CARE_PROVIDER_SITE_OTHER): Payer: BC Managed Care – PPO

## 2013-12-03 ENCOUNTER — Telehealth: Payer: Self-pay | Admitting: Internal Medicine

## 2013-12-03 DIAGNOSIS — R7989 Other specified abnormal findings of blood chemistry: Secondary | ICD-10-CM

## 2013-12-03 DIAGNOSIS — R946 Abnormal results of thyroid function studies: Secondary | ICD-10-CM

## 2013-12-03 NOTE — Telephone Encounter (Signed)
Pt came in to office for lab work.  Stopped by front desk wanting to speak to Dr. Nicki Reaper.  Advised pt Dr. Nicki Reaper is not in office today.  Pt states she is having a pulling/catching sensation under her rib cage with certain movements (ex: Tieing shoes).  States it happened last week while doing exercise and she was advised to let her doctor know.  Pt asking for call from Dr. Nicki Reaper.

## 2013-12-03 NOTE — Telephone Encounter (Signed)
Please advise 

## 2013-12-04 ENCOUNTER — Encounter: Payer: Self-pay | Admitting: Internal Medicine

## 2013-12-04 LAB — T3, FREE: T3, Free: 3.2 pg/mL (ref 2.3–4.2)

## 2013-12-04 LAB — TSH: TSH: 0.02 u[IU]/mL — AB (ref 0.35–5.50)

## 2013-12-04 LAB — T4, FREE: Free T4: 0.91 ng/dL (ref 0.60–1.60)

## 2013-12-04 NOTE — Telephone Encounter (Signed)
Spoke with patient, she state it does not happen all the time mostly when she bends over. It feels like something is catching under the ribcage but once she straightens up it will go away. This is not a consistent problem but she does notice it from time to time.

## 2013-12-04 NOTE — Telephone Encounter (Signed)
Pt aware I was out of office yesterday.  Please call pt and let her know if she is having persistent problems, she will need to be reevaluated.

## 2013-12-05 NOTE — Telephone Encounter (Signed)
Sent pt my chart message about appt.

## 2013-12-31 ENCOUNTER — Encounter: Payer: Self-pay | Admitting: Internal Medicine

## 2014-01-16 ENCOUNTER — Encounter: Payer: Self-pay | Admitting: Internal Medicine

## 2014-01-16 ENCOUNTER — Ambulatory Visit (INDEPENDENT_AMBULATORY_CARE_PROVIDER_SITE_OTHER): Payer: BC Managed Care – PPO | Admitting: Internal Medicine

## 2014-01-16 VITALS — BP 110/80 | HR 81 | Temp 97.9°F | Ht 61.0 in | Wt 147.0 lb

## 2014-01-16 DIAGNOSIS — Z8669 Personal history of other diseases of the nervous system and sense organs: Secondary | ICD-10-CM

## 2014-01-16 DIAGNOSIS — R079 Chest pain, unspecified: Secondary | ICD-10-CM

## 2014-01-16 DIAGNOSIS — R0781 Pleurodynia: Secondary | ICD-10-CM

## 2014-01-16 DIAGNOSIS — J309 Allergic rhinitis, unspecified: Secondary | ICD-10-CM

## 2014-01-16 DIAGNOSIS — R946 Abnormal results of thyroid function studies: Secondary | ICD-10-CM

## 2014-01-16 DIAGNOSIS — R7989 Other specified abnormal findings of blood chemistry: Secondary | ICD-10-CM

## 2014-01-16 DIAGNOSIS — E7800 Pure hypercholesterolemia, unspecified: Secondary | ICD-10-CM

## 2014-01-16 DIAGNOSIS — Z86010 Personal history of colonic polyps: Secondary | ICD-10-CM

## 2014-01-16 NOTE — Progress Notes (Signed)
Pre visit review using our clinic review tool, if applicable. No additional management support is needed unless otherwise documented below in the visit note. 

## 2014-01-19 ENCOUNTER — Encounter: Payer: Self-pay | Admitting: Internal Medicine

## 2014-01-19 NOTE — Progress Notes (Signed)
Subjective:    [name], female    DOB: Mar 01, 1951, 63 y.o.   MRN: [id number]  HPI 63 year old female with past history of documented hypertension, hypercholesterolemia, allergic rhinitis and migraine headaches.  She comes in today for a scheduled follow up.   Has been seeing Dr Doreatha Lew.  Had a recent vaginal hysterectomy and bladder tack.  Ovaries left in place.  Using estrace cream.  Has had some issues recently with urinary tract infections and vaginal/bladder discomfort.  Just evaluated by Dr Dennard Schaumann.  Gave her exercises to do.  Takes a stool softener daily.  Also takes metamucil prn.  Bowels do relatively well on this regimen.  Bladder stable.  She does have a history of migraine headaches.  Has been to the headache clinic.  Stable.  Sees Dr Candace Cruise.  Had a colonoscopy 01/30/12.  Has a history of colon polyps.  Had a stress echo recently.  States everything checked out fine.   Is also having problems with right neck and right shoulder pain.  When she turns her head, has a pulling sensation in her neck.  Increased pain down her right arm and right hand numbness.  Splint is helping.  She has adjusted her diet.  Is losing weight.  Feels better.  She does report that when she bends over, she will occasionally notice a catch - left upper abdomen/loer lower anterior ribs.  No persistent pain.      Past Medical History  Diagnosis Date  . Hyperlipidemia   . Migraines   . Hypertension   . Allergy   . Personal history of colonic polyps     Current Outpatient Prescriptions on File Prior to Visit  Medication Sig Dispense Refill  . calcium carbonate (OS-CAL) 600 MG TABS Take 600 mg by mouth daily.      Sarajane Marek Sodium 30-100 MG CAPS Take 100 mg by mouth 2 (two) times daily.      . cholecalciferol (VITAMIN D) 1000 UNITS tablet Take 1,000 Units by mouth daily.      Marland Kitchen erythromycin ophthalmic ointment at bedtime.      Marland Kitchen estradiol (ESTRACE) 0.1 MG/GM vaginal cream Use as  directed      . fexofenadine (ALLEGRA) 180 MG tablet Take 180 mg by mouth daily.      . Multiple Vitamins-Minerals ([employer]S SPECTRAVITE ADULT 50+) TABS Take 1 tablet by mouth daily.      . simvastatin (ZOCOR) 10 MG tablet TAKE 1 TABLET (10 MG TOTAL) BY MOUTH AT BEDTIME.  30 tablet  5  . SUMAtriptan (IMITREX) 100 MG tablet Take 1 tablet (100 mg total) by mouth every 2 (two) hours as needed for migraine (Take 1 tablet by mouth as directed at onset of headache, then may repeat in 2 hours if no relief.).  30 tablet  5   No current facility-administered medications on file prior to visit.    Review of Systems Patient denies any lightheadedness or dizziness.  Has a history of migraine headaches as outlined.  Feels these are stable.   No chest pain, tightness or palpitations.  No increased shortness of breath, cough or congestion.  No nausea or vomiting.  No acid reflux.  No abdominal pain or cramping.  No bowel change, such as diarrhea,  BRBPR or melana.  Bowels stable on current regimen.   Had some pelvic issues.  Saw Dr Dennard Schaumann.  Given exercises.  Using estrace cream. Had some hand and arm discomfort.  Wearing a wrist  splint.  Helping.         Objective:   Physical Exam  Filed Vitals:   01/16/14 1118  BP: 110/80  Pulse: 81  Temp: 97.9 F (30.69 C)   63 year old female in no acute distress.   HEENT:  Nares- clear.  Oropharynx - without lesions. NECK:  Supple.  Nontender.  No audible bruit.  HEART:  Appears to be regular. LUNGS:  No crackles or wheezing audible.  Respirations even and unlabored.  RIBS:  No pain to palpation over the left lower anterior ribs.   RADIAL PULSE:  Equal bilaterally.  ABDOMEN:  Soft, nontender.  Bowel sounds present and normal.  No audible abdominal bruit.   EXTREMITIES:  No increased edema present.  DP pulses palpable and equal bilaterally.          Assessment & Plan:  DOCUMENTED HISTORY OF HYPERTENSION.  Pt denies.  Blood pressure doing well.  Follow.   GYN.   Is s/p vaginal hysterectomy and bladder sling.  Has been seeing Dr Dennard Schaumann.    CARDIOVASCULAR.  Asymptomatic.    HEALTH MAINTENANCE.  Physical 03/13/13.   Mammogram 05/20/13 birads I. .  Colonoscopy 01/30/12.  Shingles vaccine 4/13.     I spent 25 minutes with the patient and more than 50% of the time was spent in consultation regarding the above.

## 2014-01-19 NOTE — Assessment & Plan Note (Signed)
Had a colonoscopy 01/30/12.  Per Dr Oh - due follow up 01/29/17.    

## 2014-01-19 NOTE — Assessment & Plan Note (Signed)
Stable on current regimen.  Follow.   

## 2014-01-19 NOTE — Assessment & Plan Note (Signed)
Describes pain with bending over.  Intermittent.  Not reproducible on exam.  No abdominal pain to palpation.  Follow.  Pt desires to hold on further w/up at this time.

## 2014-01-19 NOTE — Assessment & Plan Note (Signed)
On simvastatin.  Low cholesterol diet and exercise.  Follow lipid panel and liver function.      

## 2014-01-19 NOTE — Assessment & Plan Note (Signed)
[facility]ed up at [facility].  Doing well on her current regimen.  Follow.   

## 2014-04-24 ENCOUNTER — Other Ambulatory Visit (INDEPENDENT_AMBULATORY_CARE_PROVIDER_SITE_OTHER): Payer: BC Managed Care – PPO

## 2014-04-24 DIAGNOSIS — R946 Abnormal results of thyroid function studies: Secondary | ICD-10-CM

## 2014-04-24 DIAGNOSIS — E7800 Pure hypercholesterolemia, unspecified: Secondary | ICD-10-CM

## 2014-04-24 DIAGNOSIS — R7989 Other specified abnormal findings of blood chemistry: Secondary | ICD-10-CM

## 2014-04-24 LAB — T3, FREE: T3, Free: 3 pg/mL (ref 2.3–4.2)

## 2014-04-24 LAB — LIPID PANEL
CHOLESTEROL: 159 mg/dL (ref 0–200)
HDL: 54.5 mg/dL (ref >39.00)
LDL Cholesterol: 97 mg/dL (ref 0–99)
NonHDL: 104.5
Total CHOL/HDL Ratio: 3
Triglycerides: 37 mg/dL (ref 0.0–149.0)
VLDL: 7.4 mg/dL (ref 0.0–40.0)

## 2014-04-24 LAB — COMPREHENSIVE METABOLIC PANEL
ALBUMIN: 3.8 g/dL (ref 3.5–5.2)
ALT: 20 U/L (ref 0–35)
AST: 27 U/L (ref 0–37)
Alkaline Phosphatase: 107 U/L (ref 39–117)
BUN: 21 mg/dL (ref 6–23)
CALCIUM: 9.5 mg/dL (ref 8.4–10.5)
CHLORIDE: 108 meq/L (ref 96–112)
CO2: 27 meq/L (ref 19–32)
Creatinine, Ser: 0.8 mg/dL (ref 0.4–1.2)
GFR: 80.91 mL/min (ref >60.00)
GLUCOSE: 90 mg/dL (ref 70–99)
POTASSIUM: 4.5 meq/L (ref 3.5–5.1)
SODIUM: 141 meq/L (ref 135–145)
TOTAL PROTEIN: 6.8 g/dL (ref 6.0–8.3)
Total Bilirubin: 0.8 mg/dL (ref 0.2–1.2)

## 2014-04-24 LAB — T4, FREE: Free T4: 0.73 ng/dL (ref 0.60–1.60)

## 2014-04-24 LAB — TSH: TSH: 0.17 u[IU]/mL — ABNORMAL LOW (ref 0.35–4.50)

## 2014-04-25 ENCOUNTER — Encounter: Payer: Self-pay | Admitting: Internal Medicine

## 2014-04-27 ENCOUNTER — Ambulatory Visit (INDEPENDENT_AMBULATORY_CARE_PROVIDER_SITE_OTHER): Payer: BC Managed Care – PPO | Admitting: Internal Medicine

## 2014-04-27 VITALS — BP 120/70 | HR 77 | Temp 98.2°F | Ht 60.9 in | Wt 139.5 lb

## 2014-04-27 DIAGNOSIS — M25519 Pain in unspecified shoulder: Secondary | ICD-10-CM

## 2014-04-27 DIAGNOSIS — M25511 Pain in right shoulder: Secondary | ICD-10-CM

## 2014-04-27 DIAGNOSIS — Z8669 Personal history of other diseases of the nervous system and sense organs: Secondary | ICD-10-CM

## 2014-04-27 DIAGNOSIS — M25561 Pain in right knee: Secondary | ICD-10-CM

## 2014-04-27 DIAGNOSIS — Z86010 Personal history of colonic polyps: Secondary | ICD-10-CM

## 2014-04-27 DIAGNOSIS — M25569 Pain in unspecified knee: Secondary | ICD-10-CM

## 2014-04-27 DIAGNOSIS — J309 Allergic rhinitis, unspecified: Secondary | ICD-10-CM

## 2014-04-27 DIAGNOSIS — Z1239 Encounter for other screening for malignant neoplasm of breast: Secondary | ICD-10-CM

## 2014-04-27 DIAGNOSIS — E7800 Pure hypercholesterolemia, unspecified: Secondary | ICD-10-CM

## 2014-04-27 NOTE — Progress Notes (Signed)
Pre visit review using our clinic review tool, if applicable. No additional management support is needed unless otherwise documented below in the visit note. 

## 2014-04-27 NOTE — Telephone Encounter (Signed)
Unread mychart message mailed  

## 2014-04-30 ENCOUNTER — Encounter: Payer: Self-pay | Admitting: Internal Medicine

## 2014-04-30 DIAGNOSIS — M25561 Pain in right knee: Secondary | ICD-10-CM | POA: Insufficient documentation

## 2014-04-30 NOTE — Assessment & Plan Note (Signed)
Stable on current regimen.  Follow.   

## 2014-04-30 NOTE — Assessment & Plan Note (Signed)
Had a colonoscopy 01/30/12.  Per Dr Candace Cruise - due follow up 01/29/17.

## 2014-04-30 NOTE — Assessment & Plan Note (Signed)
On simvastatin.  Low cholesterol diet and exercise.  Follow lipid panel and liver function.      

## 2014-04-30 NOTE — Assessment & Plan Note (Signed)
Persistent.  Will check shoulder xray. Further w/up and treatment pending results.

## 2014-04-30 NOTE — Assessment & Plan Note (Signed)
Exam as outlined.  Discussed further evaluation.  Will monitor.  Not limiting her activity.

## 2014-04-30 NOTE — Progress Notes (Signed)
Subjective:    [name], female    DOB: 1950/09/21, 63 y.o.   MRN: [id number]  HPI 63 year old female with past history of documented hypertension, hypercholesterolemia, allergic rhinitis and migraine headaches.  She comes in today to follow up on these issues as well as for a complete physical exam.   Has been seeing Dr Doreatha Lew.  Is s/p a vaginal hysterectomy and bladder tack.  Ovaries left in place.  Bladder issues and vaginal issues appear to be stable.  Takes a stool softener daily.  Also takes metamucil prn.  Bowels do relatively well on this regimen.  She does have a history of migraine headaches.  Has been to the headache clinic.  Stable.  Sees Dr Candace Cruise.  Had a colonoscopy 01/30/12.  Has a history of colon polyps.  Had a stress echo relatively recently.  States everything checked out fine.   Is also having problems with right neck and right shoulder pain.  This is persistent.  She was having increased pain down her right arm and right hand numbness.  Splint helped with this.  This pain now seems to be more localized to her right shoulder.  She has adjusted her diet.  Is losing weight.  Feels better.  She also reports some intermittent discomfort behind her right knee.  No persistent.  Does not limit her overall activity.      Past Medical History  Diagnosis Date  . Hyperlipidemia   . Migraines   . Hypertension   . Allergy   . Personal history of colonic polyps     Current Outpatient Prescriptions on File Prior to Visit  Medication Sig Dispense Refill  . calcium carbonate (OS-CAL) 600 MG TABS Take 600 mg by mouth daily.      Sarajane Marek Sodium 30-100 MG CAPS Take 100 mg by mouth 2 (two) times daily.      . cholecalciferol (VITAMIN D) 1000 UNITS tablet Take 1,000 Units by mouth daily.      Marland Kitchen erythromycin ophthalmic ointment at bedtime.      Marland Kitchen estradiol (ESTRACE) 0.1 MG/GM vaginal cream Use as directed      . fexofenadine (ALLEGRA) 180 MG tablet Take  180 mg by mouth daily.      . Multiple Vitamins-Minerals ([employer]S SPECTRAVITE ADULT 50+) TABS Take 1 tablet by mouth daily.      . simvastatin (ZOCOR) 10 MG tablet TAKE 1 TABLET (10 MG TOTAL) BY MOUTH AT BEDTIME.  30 tablet  5  . SUMAtriptan (IMITREX) 100 MG tablet Take 1 tablet (100 mg total) by mouth every 2 (two) hours as needed for migraine (Take 1 tablet by mouth as directed at onset of headache, then may repeat in 2 hours if no relief.).  30 tablet  5  . zonisamide (ZONEGRAN) 100 MG capsule TAKE 1 CAPSULES BY MOUTH AT BEDTIME       No current facility-administered medications on file prior to visit.    Review of Systems Patient denies any lightheadedness or dizziness.  Has a history of migraine headaches as outlined.  Feels these are stable.   No chest pain, tightness or palpitations.  No increased shortness of breath, cough or congestion.  No nausea or vomiting.  No acid reflux.  No abdominal pain or cramping.  No bowel change, such as diarrhea,  BRBPR or melana.  Bowels stable on current regimen.   Had some pelvic issues.  Saw Dr Dennard Schaumann.  Given exercises.  Using estrace cream.  Appears to be stable.   Had some hand and arm discomfort.  See above.  Persistent right shoulder pain.  Knee discomfort as outlined.          Objective:   Physical Exam  Filed Vitals:   04/27/14 1558  BP: 120/70  Pulse: 77  Temp: 98.2 F (36.8 C)   Blood pressure recheck:  65/58  63 year old female in no acute distress.   HEENT:  Nares- clear.  Oropharynx - without lesions. NECK:  Supple.  Nontender.  No audible bruit.  HEART:  Appears to be regular. LUNGS:  No crackles or wheezing audible.  Respirations even and unlabored.   RADIAL PULSE:  Equal bilaterally.  BREASTS:  No nipple discharge or nipple retraction present.  Could not appreciate any distinct nodules or axillary adenopathy.   ABDOMEN:  Soft, nontender.  Bowel sounds present and normal.  No audible abdominal bruit.   GU:  Not performed.    EXTREMITIES:  No increased edema present.  DP pulses palpable and equal bilaterally.      MSK:  Increased pain right shoulder with abduction at or above 90 degrees.  Some increased pain with full extension of right arm.  No pain to palpation over the right knee.  Some minimal discomfort with palpation in the popliteal region.  No increased swelling or redness.       Assessment & Plan:  DOCUMENTED HISTORY OF HYPERTENSION.  Pt denies.  Blood pressure doing well.  Follow.   GYN.  Is s/p vaginal hysterectomy and bladder sling.  Has been seeing Dr Dennard Schaumann.  Stable.    CARDIOVASCULAR.  Asymptomatic.    HEALTH MAINTENANCE.  Physical today.   Mammogram 05/20/13 birads I. .  Schedule f/u mammogram.  Colonoscopy 01/30/12. Due f/u colonoscopy 2018.   Shingles vaccine 4/13.     I spent 25 minutes with the patient and more than 50% of the time was spent in consultation regarding the above.

## 2014-04-30 NOTE — Assessment & Plan Note (Signed)
[facility] up [facility].  Doing well on her current regimen.  Follow.

## 2014-05-14 ENCOUNTER — Ambulatory Visit: Payer: Self-pay | Admitting: Internal Medicine

## 2014-05-18 ENCOUNTER — Encounter: Payer: Self-pay | Admitting: Internal Medicine

## 2014-05-28 ENCOUNTER — Ambulatory Visit: Payer: Self-pay | Admitting: Internal Medicine

## 2014-06-03 ENCOUNTER — Ambulatory Visit: Payer: Self-pay | Admitting: Internal Medicine

## 2014-06-03 LAB — HM MAMMOGRAPHY

## 2014-06-05 ENCOUNTER — Encounter: Payer: Self-pay | Admitting: Internal Medicine

## 2014-06-08 ENCOUNTER — Other Ambulatory Visit: Payer: Self-pay | Admitting: Internal Medicine

## 2014-06-10 ENCOUNTER — Other Ambulatory Visit: Payer: Self-pay | Admitting: Internal Medicine

## 2014-06-11 ENCOUNTER — Telehealth: Payer: Self-pay | Admitting: *Deleted

## 2014-06-11 NOTE — Telephone Encounter (Signed)
Work in at [employer] for urinary issues.

## 2014-06-11 NOTE — Telephone Encounter (Signed)
Pt coming at 1:00

## 2014-06-11 NOTE — Telephone Encounter (Signed)
Pt called requesting to come in to leave a UA, states she is having lower right sided back pain with dark colored urine.  Please advise

## 2014-06-11 NOTE — Telephone Encounter (Signed)
complete

## 2014-06-12 ENCOUNTER — Encounter: Payer: Self-pay | Admitting: Internal Medicine

## 2014-06-12 ENCOUNTER — Ambulatory Visit (INDEPENDENT_AMBULATORY_CARE_PROVIDER_SITE_OTHER): Payer: BC Managed Care – PPO | Admitting: Internal Medicine

## 2014-06-12 ENCOUNTER — Telehealth: Payer: Self-pay | Admitting: Internal Medicine

## 2014-06-12 VITALS — BP 110/80 | HR 87 | Temp 98.3°F | Ht 60.9 in | Wt 141.5 lb

## 2014-06-12 DIAGNOSIS — R8290 Unspecified abnormal findings in urine: Secondary | ICD-10-CM

## 2014-06-12 DIAGNOSIS — M545 Low back pain, unspecified: Secondary | ICD-10-CM

## 2014-06-12 DIAGNOSIS — N3001 Acute cystitis with hematuria: Secondary | ICD-10-CM

## 2014-06-12 DIAGNOSIS — R8299 Other abnormal findings in urine: Secondary | ICD-10-CM

## 2014-06-12 LAB — POCT URINALYSIS DIPSTICK
BILIRUBIN UA: NEGATIVE
Glucose, UA: NEGATIVE
Ketones, UA: NEGATIVE
NITRITE UA: POSITIVE
PH UA: 5.5
Protein, UA: NEGATIVE
Spec Grav, UA: 1.025
Urobilinogen, UA: 0.2

## 2014-06-12 MED ORDER — CIPROFLOXACIN HCL 500 MG PO TABS: 500.0000 mg | ORAL_TABLET | Freq: Two times a day (BID) | ORAL | Status: DC

## 2014-06-12 NOTE — Progress Notes (Signed)
Pre visit review using our clinic review tool, if applicable. No additional management support is needed unless otherwise documented below in the visit note. 

## 2014-06-12 NOTE — Telephone Encounter (Signed)
Duplicate message pt spoke with and scheduled on 10.15.15.

## 2014-06-12 NOTE — Telephone Encounter (Signed)
Kristi Greer called saying she has low back pain and cloudy urine (yellow with a small amt of blood). She's wondering if she needs an appt or if she can just come in and give a urine specimen. Please advise. Pt ph# [phone] Thank you.

## 2014-06-12 NOTE — Patient Instructions (Signed)
Take align - one per day 

## 2014-06-14 ENCOUNTER — Encounter: Payer: Self-pay | Admitting: Internal Medicine

## 2014-06-14 DIAGNOSIS — M549 Dorsalgia, unspecified: Secondary | ICD-10-CM | POA: Insufficient documentation

## 2014-06-14 NOTE — Assessment & Plan Note (Signed)
Back pain as outlined.  Urine dip as outlined.  Treat with cipro.  Can take tylenol.  See if back pain resolves with treatment of uti.

## 2014-06-14 NOTE — Assessment & Plan Note (Signed)
Urine dip c/w uti.  Treat with cipro as directed.  Stay hydrated.  Send urine for culture.

## 2014-06-14 NOTE — Progress Notes (Signed)
Subjective:    [name], female    DOB: [birth year], 63 y.o.   MRN: [id number]  Back Pain  63 year old female with past history of documented hypertension, hypercholesterolemia, allergic rhinitis and migraine headaches.  She comes in today as a work in with concerns regarding right lower back pain.  Started two weeks ago.  Has been using the heating pad.  Thought at first, msk pain.  Pain persistent.  This week noticed urine darker and cloudy.  Question of blood in the urine.  Some cramping last week, but no other abdominal pain.  No vaginal discharge or problems.  No injury to her back.   Has been seeing Dr Doreatha Lew.  Is s/p a vaginal hysterectomy and bladder tack.  Ovaries left in place.        Past Medical History  Diagnosis Date  . Hyperlipidemia   . Migraines   . Hypertension   . Allergy   . Personal history of colonic polyps     Current Outpatient Prescriptions on File Prior to Visit  Medication Sig Dispense Refill  . calcium carbonate (OS-CAL) 600 MG TABS Take 600 mg by mouth daily.      Sarajane Marek Sodium 30-100 MG CAPS Take 100 mg by mouth 2 (two) times daily.      . cholecalciferol (VITAMIN D) 1000 UNITS tablet Take 1,000 Units by mouth daily.      Marland Kitchen estradiol (ESTRACE) 0.1 MG/GM vaginal cream Use as directed      . fexofenadine (ALLEGRA) 180 MG tablet Take 180 mg by mouth daily.      . Multiple Vitamins-Minerals ([employer]S SPECTRAVITE ADULT 50+) TABS Take 1 tablet by mouth daily.      . simvastatin (ZOCOR) 10 MG tablet TAKE 1 TABLET (10 MG TOTAL) BY MOUTH AT BEDTIME.  30 tablet  5  . SUMAtriptan (IMITREX) 100 MG tablet Take 1 tablet (100 mg total) by mouth every 2 (two) hours as needed for migraine (Take 1 tablet by mouth as directed at onset of headache, then may repeat in 2 hours if no relief.).  30 tablet  5  . zonisamide (ZONEGRAN) 100 MG capsule TAKE 1 CAPSULES BY MOUTH AT BEDTIME       No current facility-administered medications on  file prior to visit.    Review of Systems  Musculoskeletal: Positive for back pain.  No fever.  No nausea or vomiting.  Eating and drinking well.  No abdominal pain or cramping now.  Previously noticed some cramping.  Right lower back pain as outlined.  Urine is darker and cloudy.   Question of blood in the urine.  No vaginal symptoms.          Objective:   Physical Exam  Filed Vitals:   06/12/14 1305  BP: 110/80  Pulse: 87  Temp: 98.3 F (56.54 C)   63 year old female in no acute distress.  HEART:  Appears to be regular. LUNGS:  No crackles or wheezing audible.  Respirations even and unlabored   ABDOMEN:  Soft, nontender.  Bowel sounds present and normal.   BACK:  Non tender.  No CVA tenderness.       Assessment & Plan:  GYN.  Is s/p vaginal hysterectomy and bladder sling.  Has been seeing Dr Dennard Schaumann.  Stable.    Problem List Items Addressed This Visit   Back pain     Back pain as outlined.  Urine dip as outlined.  Treat with cipro.  Can take tylenol.  See if back pain resolves with treatment of uti.      UTI (urinary tract infection)     Urine dip c/w uti.  Treat with cipro as directed.  Stay hydrated.  Send urine for culture.       Other Visit Diagnoses   Cloudy urine    -  Primary    Relevant Orders       POCT Urinalysis Dipstick (Completed)       CULTURE, URINE COMPREHENSIVE

## 2014-06-15 ENCOUNTER — Encounter: Payer: Self-pay | Admitting: Internal Medicine

## 2014-06-15 LAB — CULTURE, URINE COMPREHENSIVE: Colony Count: 80000

## 2014-06-24 ENCOUNTER — Encounter: Payer: Self-pay | Admitting: Internal Medicine

## 2014-06-30 ENCOUNTER — Encounter: Payer: Self-pay | Admitting: Internal Medicine

## 2014-07-03 ENCOUNTER — Ambulatory Visit (INDEPENDENT_AMBULATORY_CARE_PROVIDER_SITE_OTHER): Payer: BC Managed Care – PPO | Admitting: Internal Medicine

## 2014-07-03 ENCOUNTER — Encounter: Payer: Self-pay | Admitting: Internal Medicine

## 2014-07-03 VITALS — BP 110/68 | HR 109 | Temp 98.4°F | Ht 60.9 in | Wt 144.0 lb

## 2014-07-03 DIAGNOSIS — R000 Tachycardia, unspecified: Secondary | ICD-10-CM

## 2014-07-03 DIAGNOSIS — R351 Nocturia: Secondary | ICD-10-CM

## 2014-07-03 DIAGNOSIS — M25473 Effusion, unspecified ankle: Secondary | ICD-10-CM

## 2014-07-03 DIAGNOSIS — R42 Dizziness and giddiness: Secondary | ICD-10-CM

## 2014-07-03 DIAGNOSIS — I4891 Unspecified atrial fibrillation: Secondary | ICD-10-CM

## 2014-07-03 DIAGNOSIS — R34 Anuria and oliguria: Secondary | ICD-10-CM

## 2014-07-03 NOTE — Progress Notes (Signed)
Pre visit review using our clinic review tool, if applicable. No additional management support is needed unless otherwise documented below in the visit note. 

## 2014-07-04 LAB — URINALYSIS, ROUTINE W REFLEX MICROSCOPIC
BILIRUBIN URINE: NEGATIVE
GLUCOSE, UA: NEGATIVE mg/dL
Ketones, ur: NEGATIVE mg/dL
Nitrite: POSITIVE — AB
PH: 7 (ref 5.0–8.0)
Protein, ur: NEGATIVE mg/dL
Specific Gravity, Urine: 1.005 (ref 1.005–1.030)
Urobilinogen, UA: 0.2 mg/dL (ref 0.0–1.0)

## 2014-07-04 LAB — CBC WITH DIFFERENTIAL/PLATELET
BASOS PCT: 1 % (ref 0–1)
Basophils Absolute: 0.1 10*3/uL (ref 0.0–0.1)
Eosinophils Absolute: 0.1 10*3/uL (ref 0.0–0.7)
Eosinophils Relative: 1 % (ref 0–5)
HEMATOCRIT: 41 % (ref 36.0–46.0)
Hemoglobin: 13.2 g/dL (ref 12.0–15.0)
LYMPHS PCT: 66 % — AB (ref 12–46)
Lymphs Abs: 6.5 10*3/uL — ABNORMAL HIGH (ref 0.7–4.0)
MCH: 27.1 pg (ref 26.0–34.0)
MCHC: 32.2 g/dL (ref 30.0–36.0)
MCV: 84.2 fL (ref 78.0–100.0)
MONO ABS: 0.8 10*3/uL (ref 0.1–1.0)
MONOS PCT: 8 % (ref 3–12)
Neutro Abs: 2.4 10*3/uL (ref 1.7–7.7)
Neutrophils Relative %: 24 % — ABNORMAL LOW (ref 43–77)
Platelets: 189 10*3/uL (ref 150–400)
RBC: 4.87 MIL/uL (ref 3.87–5.11)
RDW: 15 % (ref 11.5–15.5)
WBC: 9.8 10*3/uL (ref 4.0–10.5)

## 2014-07-04 LAB — BASIC METABOLIC PANEL
BUN: 14 mg/dL (ref 6–23)
CO2: 26 meq/L (ref 19–32)
Calcium: 8.7 mg/dL (ref 8.4–10.5)
Chloride: 107 mEq/L (ref 96–112)
Creat: 0.77 mg/dL (ref 0.50–1.10)
GLUCOSE: 68 mg/dL — AB (ref 70–99)
POTASSIUM: 4.5 meq/L (ref 3.5–5.3)
SODIUM: 141 meq/L (ref 135–145)

## 2014-07-04 LAB — URINALYSIS, MICROSCOPIC ONLY
Casts: NONE SEEN
Crystals: NONE SEEN
Squamous Epithelial / LPF: NONE SEEN

## 2014-07-04 LAB — TSH: TSH: 0.069 u[IU]/mL — ABNORMAL LOW (ref 0.350–4.500)

## 2014-07-06 ENCOUNTER — Other Ambulatory Visit: Payer: Self-pay | Admitting: *Deleted

## 2014-07-06 ENCOUNTER — Encounter: Payer: Self-pay | Admitting: Internal Medicine

## 2014-07-06 ENCOUNTER — Encounter: Payer: Self-pay | Admitting: *Deleted

## 2014-07-06 ENCOUNTER — Other Ambulatory Visit: Payer: Self-pay | Admitting: Internal Medicine

## 2014-07-06 DIAGNOSIS — R34 Anuria and oliguria: Secondary | ICD-10-CM | POA: Insufficient documentation

## 2014-07-06 DIAGNOSIS — R7989 Other specified abnormal findings of blood chemistry: Secondary | ICD-10-CM

## 2014-07-06 DIAGNOSIS — R42 Dizziness and giddiness: Secondary | ICD-10-CM | POA: Insufficient documentation

## 2014-07-06 DIAGNOSIS — M25473 Effusion, unspecified ankle: Secondary | ICD-10-CM | POA: Insufficient documentation

## 2014-07-06 LAB — CULTURE, URINE COMPREHENSIVE: Colony Count: >=100000

## 2014-07-06 LAB — PATHOLOGIST SMEAR REVIEW

## 2014-07-06 MED ORDER — SULFAMETHOXAZOLE-TRIMETHOPRIM 800-160 MG PO TABS: 1.0000 | ORAL_TABLET | Freq: Two times a day (BID) | ORAL | Status: DC

## 2014-07-06 NOTE — Progress Notes (Signed)
Subjective:    [name], female    DOB: 03/31/1951, 63 y.o.   MRN: [id number]  HPI 63 year old female with past history of documented hypertension, hypercholesterolemia, allergic rhinitis and migraine headaches.  She comes in today as a work in with concerns regarding some ankle swelling.  She reports that over the past week, her ankles have swollen more.  No increased sob.  She has noticed some decreased urination.  On questioning her, she does report that she has changed jobs.  is sitting more now.  Legs hang down.  She used to be more active during the day.  This change just occurred.   She also reports that last week, she noticed being dizzy.  Described the sensation as room spinning.  Would occur with sitting.  position changes did not appear to make a big difference.  No headache. Is better now.  Just some residual light headedness.  No vision change.  No other change.  No chest pain or tightness.  No increased heart rate or palpitations.  No sob. Had a stress echo relatively recently.  States everything checked out fine.      Past Medical History  Diagnosis Date  . Hyperlipidemia   . Migraines   . Hypertension   . Allergy   . Personal history of colonic polyps     Current Outpatient Prescriptions on File Prior to Visit  Medication Sig Dispense Refill  . calcium carbonate (OS-CAL) 600 MG TABS Take 600 mg by mouth daily.    Sarajane Marek Sodium 30-100 MG CAPS Take 100 mg by mouth 2 (two) times daily.    . cholecalciferol (VITAMIN D) 1000 UNITS tablet Take 1,000 Units by mouth daily.    Marland Kitchen estradiol (ESTRACE) 0.1 MG/GM vaginal cream Use as directed    . fexofenadine (ALLEGRA) 180 MG tablet Take 180 mg by mouth daily.    . Multiple Vitamins-Minerals ([employer]S SPECTRAVITE ADULT 50+) TABS Take 1 tablet by mouth daily.    . simvastatin (ZOCOR) 10 MG tablet TAKE 1 TABLET (10 MG TOTAL) BY MOUTH AT BEDTIME. 30 tablet 5  . SUMAtriptan (IMITREX) 100 MG tablet Take  1 tablet (100 mg total) by mouth every 2 (two) hours as needed for migraine (Take 1 tablet by mouth as directed at onset of headache, then may repeat in 2 hours if no relief.). 30 tablet 5  . zonisamide (ZONEGRAN) 100 MG capsule TAKE 1 CAPSULES BY MOUTH AT BEDTIME     No current facility-administered medications on file prior to visit.    Review of Systems Dizziness as outlined.    No chest pain, tightness or palpitations. No increased heart rate.   No increased shortness of breath, cough or congestion.  No nausea or vomiting.  No acid reflux.  No abdominal pain or cramping.  No bowel change, such as diarrhea,  BRBPR or melana.  Bowels stable on current regimen.   Decreased urination as outlined.  Some sweating at night (noticed mostly).  No documented fever.  Ankle swelling as outlined.          Objective:   Physical Exam  Filed Vitals:   07/03/14 1532  BP: 110/68  Pulse: 109  Temp: 98.4 F (43.31 C)   63 year old female in no acute distress.   HEENT:  Nares- clear.  Oropharynx - without lesions. NECK:  Supple.  Nontender.  No audible bruit.  HEART:  Appears to be regular with what appears to be premature beats.  LUNGS:  No crackles or wheezing audible.  Respirations even and unlabored.   RADIAL PULSE:  Equal bilaterally.  ABDOMEN:  Soft, nontender.  Bowel sounds present and normal.  No audible abdominal bruit.   EXTREMITIES:  Minimal ankle edema.  DP pulses palpable and equal bilaterally.      MSK:  No back tenderness to palpation.  No CVA tenderness.       Assessment & Plan:  Tachycardia Heart rate just over 100 with premature beats noted on exam. EKG performed to confirm rhythm.  EKG revealed SR with PVCs.  Had a recent stress test per pt.  States ok.  Follow.   - Basic Metabolic Panel (BMET) - CBC w/Diff - TSH - Urinalysis, Routine w reflex microscopic  Dizziness Unclear etiology.  Check routine labs.  Treat if infection.  Stay hydrated.  Eat regular meals.  EKG as  outlined.  No cardiac symptoms with increased activity or exertion.  Discussed further w/up including ENT evaluation, carotid US and MRI.  She wants to monitor for now.  Is better.  Follow.  - Basic Metabolic Panel (BMET) - CBC w/Diff - TSH - Urinalysis, Routine w reflex microscopic  Nocturia Check urinalysis and culture to confirm no infection.   - CULTURE, URINE COMPREHENSIVE; Future - Basic Metabolic Panel (BMET) - CBC w/Diff - Urinalysis, Routine w reflex microscopic - CULTURE, URINE COMPREHENSIVE  Ankle swelling, unspecified laterality Feel this is more related to venous insufficiency.  She just changed jobs.  Legs down more.  Sitting more.  Compression hose.  Follow.   Decreased urination Check urinalysis to confirm no infection.    HEALTH MAINTENANCE.  Physical last visit.    Mammogram 05/20/13 birads I. .  Mammogram 06/03/14 - Birads III.  Colonoscopy 01/30/12. Due f/u colonoscopy 2018.   Shingles vaccine 4/13.     I spent 25 minutes with the patient and more than 50% of the time was spent in consultation regarding the above.

## 2014-07-06 NOTE — Progress Notes (Signed)
Orders placed for labs

## 2014-07-07 ENCOUNTER — Other Ambulatory Visit (INDEPENDENT_AMBULATORY_CARE_PROVIDER_SITE_OTHER): Payer: BC Managed Care – PPO

## 2014-07-07 DIAGNOSIS — R946 Abnormal results of thyroid function studies: Secondary | ICD-10-CM

## 2014-07-07 DIAGNOSIS — R7989 Other specified abnormal findings of blood chemistry: Secondary | ICD-10-CM

## 2014-07-07 LAB — CBC WITH DIFFERENTIAL/PLATELET
BASOS PCT: 0.4 % (ref 0.0–3.0)
Basophils Absolute: 0 10*3/uL (ref 0.0–0.1)
EOS ABS: 0.1 10*3/uL (ref 0.0–0.7)
Eosinophils Relative: 0.8 % (ref 0.0–5.0)
HCT: 39.4 % (ref 36.0–46.0)
Hemoglobin: 12.7 g/dL (ref 12.0–15.0)
LYMPHS PCT: 51.8 % — AB (ref 12.0–46.0)
Lymphs Abs: 4.6 10*3/uL — ABNORMAL HIGH (ref 0.7–4.0)
MCHC: 32.3 g/dL (ref 30.0–36.0)
MCV: 85.1 fl (ref 78.0–100.0)
Monocytes Absolute: 0.7 10*3/uL (ref 0.1–1.0)
Monocytes Relative: 8.1 % (ref 3.0–12.0)
NEUTROS PCT: 38.9 % — AB (ref 43.0–77.0)
Neutro Abs: 3.5 10*3/uL (ref 1.4–7.7)
Platelets: 198 10*3/uL (ref 150.0–400.0)
RBC: 4.63 Mil/uL (ref 3.87–5.11)
RDW: 15.4 % (ref 11.5–15.5)
WBC: 8.9 10*3/uL (ref 4.0–10.5)

## 2014-07-07 LAB — TSH: TSH: 0.06 u[IU]/mL — AB (ref 0.35–4.50)

## 2014-07-07 LAB — T3, FREE: T3 FREE: 3.3 pg/mL (ref 2.3–4.2)

## 2014-07-07 LAB — T4, FREE: FREE T4: 0.79 ng/dL (ref 0.60–1.60)

## 2014-07-08 ENCOUNTER — Encounter: Payer: Self-pay | Admitting: Internal Medicine

## 2014-07-08 ENCOUNTER — Telehealth: Payer: Self-pay | Admitting: Internal Medicine

## 2014-07-08 DIAGNOSIS — E7800 Pure hypercholesterolemia, unspecified: Secondary | ICD-10-CM

## 2014-07-08 DIAGNOSIS — R7989 Other specified abnormal findings of blood chemistry: Secondary | ICD-10-CM

## 2014-07-08 DIAGNOSIS — D72820 Lymphocytosis (symptomatic): Secondary | ICD-10-CM

## 2014-07-08 NOTE — Telephone Encounter (Signed)
Pt notified of lab results via my chart.  Needs a fasting lab in 4 weeks.  Please schedule and notify her of appt date and time.  Also please make sure pt aware this lab appt is fasting.  Thanks.

## 2014-07-10 ENCOUNTER — Encounter: Payer: Self-pay | Admitting: Internal Medicine

## 2014-08-06 ENCOUNTER — Other Ambulatory Visit (INDEPENDENT_AMBULATORY_CARE_PROVIDER_SITE_OTHER): Payer: BC Managed Care – PPO

## 2014-08-06 DIAGNOSIS — R7989 Other specified abnormal findings of blood chemistry: Secondary | ICD-10-CM

## 2014-08-06 DIAGNOSIS — D72820 Lymphocytosis (symptomatic): Secondary | ICD-10-CM

## 2014-08-06 DIAGNOSIS — R946 Abnormal results of thyroid function studies: Secondary | ICD-10-CM

## 2014-08-06 DIAGNOSIS — E7800 Pure hypercholesterolemia, unspecified: Secondary | ICD-10-CM

## 2014-08-06 LAB — LIPID PANEL
CHOL/HDL RATIO: 3
Cholesterol: 159 mg/dL (ref 0–200)
HDL: 50.1 mg/dL (ref >39.00)
LDL CALC: 95 mg/dL (ref 0–99)
NONHDL: 108.9
Triglycerides: 69 mg/dL (ref 0.0–149.0)
VLDL: 13.8 mg/dL (ref 0.0–40.0)

## 2014-08-06 LAB — CBC WITH DIFFERENTIAL/PLATELET
BASOS ABS: 0 10*3/uL (ref 0.0–0.1)
Basophils Relative: 0.5 % (ref 0.0–3.0)
EOS ABS: 0.2 10*3/uL (ref 0.0–0.7)
Eosinophils Relative: 3.4 % (ref 0.0–5.0)
HCT: 39 % (ref 36.0–46.0)
HEMOGLOBIN: 12.8 g/dL (ref 12.0–15.0)
Lymphocytes Relative: 41.9 % (ref 12.0–46.0)
Lymphs Abs: 2.9 10*3/uL (ref 0.7–4.0)
MCHC: 32.7 g/dL (ref 30.0–36.0)
MCV: 85 fl (ref 78.0–100.0)
MONOS PCT: 6 % (ref 3.0–12.0)
Monocytes Absolute: 0.4 10*3/uL (ref 0.1–1.0)
NEUTROS PCT: 48.2 % (ref 43.0–77.0)
Neutro Abs: 3.3 10*3/uL (ref 1.4–7.7)
Platelets: 197 10*3/uL (ref 150.0–400.0)
RBC: 4.59 Mil/uL (ref 3.87–5.11)
RDW: 15.6 % — AB (ref 11.5–15.5)
WBC: 6.8 10*3/uL (ref 4.0–10.5)

## 2014-08-06 LAB — T4, FREE: FREE T4: 0.68 ng/dL (ref 0.60–1.60)

## 2014-08-06 LAB — COMPREHENSIVE METABOLIC PANEL
ALBUMIN: 3.5 g/dL (ref 3.5–5.2)
ALT: 28 U/L (ref 0–35)
AST: 31 U/L (ref 0–37)
Alkaline Phosphatase: 86 U/L (ref 39–117)
BUN: 16 mg/dL (ref 6–23)
CALCIUM: 9.1 mg/dL (ref 8.4–10.5)
CHLORIDE: 107 meq/L (ref 96–112)
CO2: 27 meq/L (ref 19–32)
Creatinine, Ser: 0.8 mg/dL (ref 0.4–1.2)
GFR: 83.32 mL/min (ref >60.00)
Glucose, Bld: 91 mg/dL (ref 70–99)
POTASSIUM: 4.3 meq/L (ref 3.5–5.1)
Sodium: 139 mEq/L (ref 135–145)
TOTAL PROTEIN: 6.2 g/dL (ref 6.0–8.3)
Total Bilirubin: 0.5 mg/dL (ref 0.2–1.2)

## 2014-08-06 LAB — TSH: TSH: 0.09 u[IU]/mL — ABNORMAL LOW (ref 0.35–4.50)

## 2014-08-06 LAB — T3, FREE: T3, Free: 3.7 pg/mL (ref 2.3–4.2)

## 2014-08-08 ENCOUNTER — Encounter: Payer: Self-pay | Admitting: Internal Medicine

## 2014-10-09 ENCOUNTER — Ambulatory Visit (INDEPENDENT_AMBULATORY_CARE_PROVIDER_SITE_OTHER): Payer: BLUE CROSS/BLUE SHIELD | Admitting: Internal Medicine

## 2014-10-09 ENCOUNTER — Encounter: Payer: Self-pay | Admitting: Internal Medicine

## 2014-10-09 VITALS — BP 104/67 | HR 78 | Temp 98.1°F | Ht 60.9 in | Wt 150.5 lb

## 2014-10-09 DIAGNOSIS — E7800 Pure hypercholesterolemia, unspecified: Secondary | ICD-10-CM

## 2014-10-09 DIAGNOSIS — Z86010 Personal history of colonic polyps: Secondary | ICD-10-CM

## 2014-10-09 DIAGNOSIS — R7989 Other specified abnormal findings of blood chemistry: Secondary | ICD-10-CM

## 2014-10-09 DIAGNOSIS — J309 Allergic rhinitis, unspecified: Secondary | ICD-10-CM

## 2014-10-09 DIAGNOSIS — R928 Other abnormal and inconclusive findings on diagnostic imaging of breast: Secondary | ICD-10-CM

## 2014-10-09 DIAGNOSIS — M25511 Pain in right shoulder: Secondary | ICD-10-CM

## 2014-10-09 DIAGNOSIS — R946 Abnormal results of thyroid function studies: Secondary | ICD-10-CM

## 2014-10-09 DIAGNOSIS — M25512 Pain in left shoulder: Secondary | ICD-10-CM

## 2014-10-09 MED ORDER — ESTRADIOL 0.1 MG/GM VA CREA: TOPICAL_CREAM | VAGINAL | Status: DC

## 2014-10-09 NOTE — Progress Notes (Signed)
Pre visit review using our clinic review tool, if applicable. No additional management support is needed unless otherwise documented below in the visit note. 

## 2014-10-09 NOTE — Progress Notes (Signed)
[name], female   DOB: [birth year], 64 y.o.   MRN: [id number]   Subjective:    [name], female    DOB: Oct 27, 1950, 64 y.o.   MRN: [id number]  HPI  Patient here for a scheduled follow up.  She states she is doing relatively well.  Increased right shoulder pain.  Increased pain with lying on her right arm.  Discussed referral.  No cardiac symptoms with increased activity or exertion.  Breathing stable.  Eating and drinking well.  Bladder stable.  Uses estrace cream 2x/week.  Request refill.  Bowels stable.     Past Medical History  Diagnosis Date  . Hyperlipidemia   . Migraines   . Hypertension   . Allergy   . Personal history of colonic polyps     Current Outpatient Prescriptions on File Prior to Visit  Medication Sig Dispense Refill  . calcium carbonate (OS-CAL) 600 MG TABS Take 600 mg by mouth daily.    Sarajane Marek Sodium 30-100 MG CAPS Take 100 mg by mouth 2 (two) times daily.    . cholecalciferol (VITAMIN D) 1000 UNITS tablet Take 1,000 Units by mouth daily.    . fexofenadine (ALLEGRA) 180 MG tablet Take 180 mg by mouth daily.    . Multiple Vitamins-Minerals (CVS SPECTRAVITE ADULT 50+) TABS Take 1 tablet by mouth daily.    . simvastatin (ZOCOR) 10 MG tablet TAKE 1 TABLET (10 MG TOTAL) BY MOUTH AT BEDTIME. 30 tablet 5  . sulfamethoxazole-trimethoprim (BACTRIM DS,SEPTRA DS) 800-160 MG per tablet Take 1 tablet by mouth 2 (two) times daily. 14 tablet 0  . SUMAtriptan (IMITREX) 100 MG tablet Take 1 tablet (100 mg total) by mouth every 2 (two) hours as needed for migraine (Take 1 tablet by mouth as directed at onset of headache, then may repeat in 2 hours if no relief.). 30 tablet 5  . zonisamide (ZONEGRAN) 100 MG capsule TAKE 1 CAPSULES BY MOUTH AT BEDTIME     No current facility-administered medications on file prior to visit.    Review of Systems  Constitutional: Negative for appetite change and unexpected weight  change.  HENT: Negative for congestion and sinus pressure.   Respiratory: Negative for cough, chest tightness and shortness of breath.   Cardiovascular: Negative for chest pain, palpitations and leg swelling.  Gastrointestinal: Negative for nausea, vomiting, abdominal pain and diarrhea.  Genitourinary: Negative for dysuria and difficulty urinating.  Musculoskeletal:       Increased right shoulder pain.  Hurts more when lying on the right arm.    Neurological: Negative for dizziness, light-headedness and headaches.       Objective:    Physical Exam  HENT:  Nose: Nose normal.  Mouth/Throat: Oropharynx is clear and moist.  Neck: Neck supple. No thyromegaly present.  Cardiovascular: Normal rate and regular rhythm.   Pulmonary/Chest: Breath sounds normal. No respiratory distress. She has no wheezes.  Abdominal: Soft. Bowel sounds are normal. There is no tenderness.  Musculoskeletal: She exhibits no edema or tenderness.  Increased pain right shoulder - palpation.    Lymphadenopathy:    She has no cervical adenopathy.    BP 104/67 mmHg  Pulse 78  Temp(Src) 98.1 F (36.7 C) (Oral)  Ht 5' 0.9" (1.547 m)  Wt 150 lb 8 oz (68.266 kg)  BMI 28.52 kg/m2  SpO2 98%  LMP 10/18/2012 Wt Readings from Last 3 Encounters:  10/09/14 150 lb 8 oz (68.266 kg)  07/03/14 144 lb (65.318 kg)  06/12/14 141 lb 8 oz (64.184 kg)     Lab Results  Component Value Date   WBC 6.8 08/06/2014   HGB 12.8 08/06/2014   HCT 39.0 08/06/2014   PLT 197.0 08/06/2014   GLUCOSE 91 08/06/2014   CHOL 159 08/06/2014   TRIG 69.0 08/06/2014   HDL 50.10 08/06/2014   LDLCALC 95 08/06/2014   ALT 28 08/06/2014   AST 31 08/06/2014   NA 139 08/06/2014   K 4.3 08/06/2014   CL 107 08/06/2014   CREATININE 0.8 08/06/2014   BUN 16 08/06/2014   CO2 27 08/06/2014   TSH 0.09* 08/06/2014        Assessment & Plan:   Problem List Items Addressed This Visit    Abnormal mammogram    Mammogram 06/03/14 - birads III.   Recommended a f/u mammogram in 6 months.  Schedule.        Allergic rhinitis    Stable.        History of colonic polyps    Colonoscopy 01/30/12.  Due f/u colonoscopy 01/29/17.       Hypercholesterolemia    Low cholesterol diet and exercise.  Follow lipid panel and liver function tests.  Continues on simvastatin.  Last LDL 95.       Relevant Orders   Comprehensive metabolic panel   Lipid panel   Right shoulder pain    Increased pain - right shoulder.  Refer to ortho for evaluation and treatment.         Other Visit Diagnoses    Left shoulder pain    -  Primary    Relevant Orders    Ambulatory referral to Orthopedic Surgery    Low TSH level        Relevant Orders    TSH    T3, free    T4, free        Shelitha Magley, Randell Patient, MD

## 2014-10-11 ENCOUNTER — Encounter: Payer: Self-pay | Admitting: Internal Medicine

## 2014-10-11 DIAGNOSIS — R928 Other abnormal and inconclusive findings on diagnostic imaging of breast: Secondary | ICD-10-CM | POA: Insufficient documentation

## 2014-10-11 NOTE — Assessment & Plan Note (Signed)
Colonoscopy 01/30/12.  Due f/u colonoscopy 01/29/17.

## 2014-10-11 NOTE — Assessment & Plan Note (Signed)
Stable

## 2014-10-11 NOTE — Assessment & Plan Note (Signed)
Increased pain - right shoulder.  Refer to ortho for evaluation and treatment.

## 2014-10-11 NOTE — Assessment & Plan Note (Signed)
Low cholesterol diet and exercise.  Follow lipid panel and liver function tests.  Continues on simvastatin.  Last LDL 95.

## 2014-10-11 NOTE — Assessment & Plan Note (Signed)
Mammogram 06/03/14 - birads III.  Recommended a f/u mammogram in 6 months.  Schedule.

## 2014-10-22 ENCOUNTER — Encounter: Payer: Self-pay | Admitting: Internal Medicine

## 2014-10-23 ENCOUNTER — Encounter: Payer: Self-pay | Admitting: Nurse Practitioner

## 2014-10-23 ENCOUNTER — Ambulatory Visit (INDEPENDENT_AMBULATORY_CARE_PROVIDER_SITE_OTHER)
Admission: RE | Admit: 2014-10-23 | Discharge: 2014-10-23 | Disposition: A | Discharge status: Home / Self Care | Payer: BLUE CROSS/BLUE SHIELD | Source: Ambulatory Visit | Attending: Nurse Practitioner | Admitting: Nurse Practitioner

## 2014-10-23 ENCOUNTER — Other Ambulatory Visit: Payer: Self-pay | Admitting: Nurse Practitioner

## 2014-10-23 ENCOUNTER — Ambulatory Visit (INDEPENDENT_AMBULATORY_CARE_PROVIDER_SITE_OTHER): Payer: BLUE CROSS/BLUE SHIELD | Admitting: Nurse Practitioner

## 2014-10-23 VITALS — BP 116/70 | HR 100 | Temp 97.9°F | Resp 16 | Ht 60.0 in | Wt 150.8 lb

## 2014-10-23 DIAGNOSIS — M5412 Radiculopathy, cervical region: Secondary | ICD-10-CM

## 2014-10-23 DIAGNOSIS — M546 Pain in thoracic spine: Secondary | ICD-10-CM

## 2014-10-23 DIAGNOSIS — M792 Neuralgia and neuritis, unspecified: Secondary | ICD-10-CM

## 2014-10-23 DIAGNOSIS — M542 Cervicalgia: Secondary | ICD-10-CM

## 2014-10-23 MED ORDER — CYCLOBENZAPRINE HCL 5 MG PO TABS: 5.0000 mg | ORAL_TABLET | Freq: Three times a day (TID) | ORAL | Status: DC | PRN

## 2014-10-23 NOTE — Progress Notes (Signed)
Pre visit review using our clinic review tool, if applicable. No additional management support is needed unless otherwise documented below in the visit note. 

## 2014-10-23 NOTE — Patient Instructions (Signed)
Flexeril 5 mg at night time.   Cervical Radiculopathy Cervical radiculopathy happens when a nerve in the neck is pinched or bruised by a slipped (herniated) disk or by arthritic changes in the bones of the cervical spine. This can occur due to an injury or as part of the normal aging process. Pressure on the cervical nerves can cause pain or numbness that runs from your neck all the way down into your arm and fingers. CAUSES  There are many possible causes, including:  Injury.  Muscle tightness in the neck from overuse.  Swollen, painful joints (arthritis).  Breakdown or degeneration in the bones and joints of the spine (spondylosis) due to aging.  Bone spurs that may develop near the cervical nerves. SYMPTOMS  Symptoms include pain, weakness, or numbness in the affected arm and hand. Pain can be severe or irritating. Symptoms may be worse when extending or turning the neck. DIAGNOSIS  Your caregiver will ask about your symptoms and do a physical exam. He or she may test your strength and reflexes. X-rays, CT scans, and MRI scans may be needed in cases of injury or if the symptoms do not go away after a period of time. Electromyography (EMG) or nerve conduction testing may be done to study how your nerves and muscles are working. TREATMENT  Your caregiver may recommend certain exercises to help relieve your symptoms. Cervical radiculopathy can, and often does, get better with time and treatment. If your problems continue, treatment options may include:  Wearing a soft collar for short periods of time.  Physical therapy to strengthen the neck muscles.  Medicines, such as nonsteroidal anti-inflammatory drugs (NSAIDs), oral corticosteroids, or spinal injections.  Surgery. Different types of surgery may be done depending on the cause of your problems. HOME CARE INSTRUCTIONS   Put ice on the affected area.  Put ice in a plastic bag.  Place a towel between your skin and the  bag.  Leave the ice on for 15-20 minutes, 03-04 times a day or as directed by your caregiver.  If ice does not help, you can try using heat. Take a warm shower or bath, or use a hot water bottle as directed by your caregiver.  You may try a gentle neck and shoulder massage.  Use a flat pillow when you sleep.  Only take over-the-counter or prescription medicines for pain, discomfort, or fever as directed by your caregiver.  If physical therapy was prescribed, follow your caregiver's directions.  If a soft collar was prescribed, use it as directed. SEEK IMMEDIATE MEDICAL CARE IF:   Your pain gets much worse and cannot be controlled with medicines.  You have weakness or numbness in your hand, arm, face, or leg.  You have a high fever or a stiff, rigid neck.  You lose bowel or bladder control (incontinence).  You have trouble with walking, balance, or speaking. MAKE SURE YOU:   Understand these instructions.  Will watch your condition.  Will get help right away if you are not doing well or get worse. Document Released: 05/09/2001 Document Revised: 11/06/2011 Document Reviewed: 03/28/2011 [employer] [facility] Patient Information 2015 [place], [place]. This information is not intended to replace advice given to you by your health care provider. Make sure you discuss any questions you have with your health care provider.

## 2014-10-23 NOTE — Progress Notes (Signed)
Subjective:    [name], female    DOB: 01-Feb-1951, 64 y.o.   MRN: [id number]  HPI  Kristi Greer is a 64 yo female with a CC of back pain.   1) Left arm pain Started Sunday, Dr. Donney Rankins on Tuesday- no shoulder injections after learning about arm Meloxicam not helpful Back is painful even bilaterally  Achy left arm, stops before wrist  aggrevating- Exercising  Reliving- feels better during the day   Arm started yesterday Carries 50 month old grandson most with left arm  Review of Systems  Constitutional: Negative for fever, chills, diaphoresis and fatigue.  Respiratory: Negative for chest tightness, shortness of breath and wheezing.   Cardiovascular: Negative for chest pain, palpitations and leg swelling.  Gastrointestinal: Negative for nausea, vomiting, diarrhea and rectal pain.  Musculoskeletal: Positive for myalgias, arthralgias and neck pain.  Skin: Negative for rash.  Neurological: Negative for dizziness, weakness, numbness and headaches.  Psychiatric/Behavioral: The patient is not nervous/anxious.    Past Medical History  Diagnosis Date  . Hyperlipidemia   . Migraines   . Hypertension   . Allergy   . Personal history of colonic polyps     History   Social History  . Marital Status: Married    Spouse Name: N/A  . Number of Children: 2  . Years of Education: N/A   Occupational History  . Not on file.   Social History Main Topics  . Smoking status: Never Smoker   . Smokeless tobacco: Never Used  . Alcohol Use: 0.0 oz/week    0 Standard drinks or equivalent per week     Comment: Socially  . Drug Use: No  . Sexual Activity: Not on file   Other Topics Concern  . Not on file   Social History Narrative    Past Surgical History  Procedure Laterality Date  . Tubal ligation    . Finger surgery    . Cholecystectomy  2006  . Vaginal hysterectomy      midurethral sling  . Hysteroscopy  2011, 10/2010    Family History    Problem Relation Age of Onset  . Hypertension Mother   . Stroke Mother   . Hypercholesterolemia Father   . Diabetes Mother   . Heart disease Father     MI  . Hypercholesterolemia Father   . Hypertension Father   . Stroke Father   . Breast cancer Neg Hx   . Colon cancer Neg Hx     No Known Allergies  Current Outpatient Prescriptions on File Prior to Visit  Medication Sig Dispense Refill  . calcium carbonate (OS-CAL) 600 MG TABS Take 600 mg by mouth daily.    Sarajane Marek Sodium 30-100 MG CAPS Take 100 mg by mouth 2 (two) times daily.    . cholecalciferol (VITAMIN D) 1000 UNITS tablet Take 1,000 Units by mouth daily.    Marland Kitchen estradiol (ESTRACE) 0.1 MG/GM vaginal cream Use as directed 42.5 g 0  . fexofenadine (ALLEGRA) 180 MG tablet Take 180 mg by mouth daily.    . Multiple Vitamins-Minerals (CVS SPECTRAVITE ADULT 50+) TABS Take 1 tablet by mouth daily.    . simvastatin (ZOCOR) 10 MG tablet TAKE 1 TABLET (10 MG TOTAL) BY MOUTH AT BEDTIME. 30 tablet 5  . sulfamethoxazole-trimethoprim (BACTRIM DS,SEPTRA DS) 800-160 MG per tablet Take 1 tablet by mouth 2 (two) times daily. 14 tablet 0  . SUMAtriptan (IMITREX) 100 MG tablet Take 1 tablet (100 mg total) by  mouth every 2 (two) hours as needed for migraine (Take 1 tablet by mouth as directed at onset of headache, then may repeat in 2 hours if no relief.). 30 tablet 5  . zonisamide (ZONEGRAN) 100 MG capsule TAKE 1 CAPSULES BY MOUTH AT BEDTIME     No current facility-administered medications on file prior to visit.      Objective:   Physical Exam  Constitutional: She is oriented to person, place, and time. She appears well-developed and well-nourished. No distress.  BP 116/70 mmHg  Pulse 100  Temp(Src) 97.9 F (36.6 C) (Oral)  Resp 16  Ht 5' (1.524 m)  Wt 150 lb 12.8 oz (68.402 kg)  BMI 29.45 kg/m2  SpO2 96%  LMP 10/18/2012   HENT:  Head: Normocephalic and atraumatic.  Right Ear: External ear normal.  Left Ear: External  ear normal.  Eyes: Right eye exhibits no discharge. Left eye exhibits no discharge. No scleral icterus.  Neck: Normal range of motion. Neck supple. No thyromegaly present.  Cardiovascular: Normal rate, regular rhythm, normal heart sounds and intact distal pulses.  Exam reveals no gallop and no friction rub.   No murmur heard. Pulmonary/Chest: Effort normal and breath sounds normal. No respiratory distress. She has no wheezes. She has no rales. She exhibits no tenderness.  Musculoskeletal:  ROM of shoulders intact  Lymphadenopathy:    She has no cervical adenopathy.  Neurological: She is alert and oriented to person, place, and time. No cranial nerve deficit. She exhibits normal muscle tone. Coordination normal.  Deltoid 5/5 Bilateral, Biceps 5/5 bilateral, Wrist extensors 5/5 bilateral, Triceps 5/5 bilateral, finger flexors and abductors 5/5 bilateral, grip 5/5 bilateraly no Hoffman's, intact heel/toe/sequential walking, sensation intact upper and lower extremities. Straight leg raise negative bilaterally. DTR's upper and lower 2+   Skin: Skin is warm and dry. No rash noted. She is not diaphoretic.  Psychiatric: She has a normal mood and affect. Her behavior is normal. Judgment and thought content normal.      Assessment & Plan:

## 2014-10-26 ENCOUNTER — Encounter: Payer: Self-pay | Admitting: Internal Medicine

## 2014-10-26 ENCOUNTER — Ambulatory Visit: Payer: BC Managed Care – PPO | Admitting: Internal Medicine

## 2014-10-27 ENCOUNTER — Encounter: Payer: Self-pay | Admitting: Nurse Practitioner

## 2014-10-28 ENCOUNTER — Other Ambulatory Visit: Payer: Self-pay | Admitting: Nurse Practitioner

## 2014-10-28 NOTE — Assessment & Plan Note (Signed)
Neck and arm pain are back. Left radicular symptoms this time. Xray of cervical and thoracic spine. Cervical DDD will rx for flexeril and MR for cervical spine. FU prn.

## 2014-11-07 ENCOUNTER — Ambulatory Visit: Payer: Self-pay | Admitting: Internal Medicine

## 2014-11-11 ENCOUNTER — Other Ambulatory Visit: Payer: Self-pay | Admitting: Nurse Practitioner

## 2014-11-11 DIAGNOSIS — M4802 Spinal stenosis, cervical region: Secondary | ICD-10-CM

## 2014-11-20 ENCOUNTER — Encounter: Payer: Self-pay | Admitting: Internal Medicine

## 2014-11-20 DIAGNOSIS — R928 Other abnormal and inconclusive findings on diagnostic imaging of breast: Secondary | ICD-10-CM

## 2014-11-23 ENCOUNTER — Other Ambulatory Visit: Payer: Self-pay | Admitting: Internal Medicine

## 2014-11-23 DIAGNOSIS — R51 Headache: Principal | ICD-10-CM

## 2014-11-23 DIAGNOSIS — R519 Headache, unspecified: Secondary | ICD-10-CM

## 2014-11-24 NOTE — Telephone Encounter (Signed)
Who has been refilling this medication.  Needs to get from MD who has been prescribing.  Thanks.  Let me know if any problems.

## 2014-11-24 NOTE — Telephone Encounter (Signed)
Electronic Rx request for zonisamide received. Unfamiliar with refill protocol for drug. Patient last office visit with PCP 10/09/14 and medication last filled 10/27/13. Please advise

## 2014-11-25 NOTE — Telephone Encounter (Signed)
Need a copy of her mammogram from 05/2014.  It was a Birads III.  I need to know if she needs a diagnostic right or left mammogram.

## 2014-11-30 NOTE — Telephone Encounter (Signed)
Patient stated that she use to get medication Rxfrom Dr. Dennard Schaumann at the [facility] but she no longer goes to the headache clinic. Dr. Dennard Schaumann is no longer with that office. Patient stated that she last got medication filled by you. Patient read your name and address off of label on medicine bottle. I do not see a record of [place] filling the medication in her chart. Patient is currently taking 1 capsule at bedtime for migraine headaches. Please advise.

## 2014-11-30 NOTE — Telephone Encounter (Signed)
Copy of mammogram received & placed in green folder

## 2014-11-30 NOTE — Telephone Encounter (Signed)
Order placed for right breast mammogram and ultrasound.  (diagnostic)

## 2014-12-02 ENCOUNTER — Encounter: Payer: Self-pay | Admitting: Internal Medicine

## 2014-12-02 NOTE — Telephone Encounter (Signed)
Order placed for neurology referral.  Dr Vertell Limber is a neurosurgeon.  This would be a neurologist - for headache.   Thanks

## 2014-12-02 NOTE — Telephone Encounter (Signed)
Thanks. I explained the difference to the patient while we were on the phone discussing the matter.

## 2014-12-02 NOTE — Telephone Encounter (Signed)
Notify pt that I would like to refer her to neurology here (Dr Manuella Ghazi) - to discuss treatment options for headache.  (would like to see if need for continuing this medication or possibly changing to another medication would be best).  Ok to refill x 1 - until can get in with neurology.  Let me know if agreeable and I will place the order for the referral.   Thanks.

## 2014-12-02 NOTE — Telephone Encounter (Signed)
Patient stated that she already see's Dr. Vertell Limber (neurosurgery) for her back issue. She wanted to know if she could see him for her headache as well? She is agreeable to see Dr. Manuella Ghazi if needed.

## 2014-12-07 ENCOUNTER — Encounter: Payer: Self-pay | Admitting: Internal Medicine

## 2014-12-10 ENCOUNTER — Ambulatory Visit
Admit: 2014-12-10 | Disposition: A | Discharge status: Home / Self Care | Payer: Self-pay | Attending: Internal Medicine | Admitting: Internal Medicine

## 2014-12-10 LAB — HM MAMMOGRAPHY: HM Mammogram: NEGATIVE

## 2014-12-11 ENCOUNTER — Encounter: Payer: Self-pay | Admitting: Internal Medicine

## 2014-12-22 ENCOUNTER — Encounter: Payer: Self-pay | Admitting: Internal Medicine

## 2014-12-22 ENCOUNTER — Other Ambulatory Visit: Payer: Self-pay

## 2014-12-22 MED ORDER — SIMVASTATIN 10 MG PO TABS: 10.0000 mg | ORAL_TABLET | Freq: Every day | ORAL | Status: DC

## 2015-01-07 ENCOUNTER — Encounter: Payer: Self-pay | Admitting: Internal Medicine

## 2015-04-02 ENCOUNTER — Encounter: Payer: BLUE CROSS/BLUE SHIELD | Admitting: Internal Medicine

## 2015-04-05 ENCOUNTER — Other Ambulatory Visit (INDEPENDENT_AMBULATORY_CARE_PROVIDER_SITE_OTHER): Payer: Managed Care, Other (non HMO)

## 2015-04-05 ENCOUNTER — Encounter: Payer: Self-pay | Admitting: Internal Medicine

## 2015-04-05 DIAGNOSIS — E7800 Pure hypercholesterolemia, unspecified: Secondary | ICD-10-CM

## 2015-04-05 DIAGNOSIS — R7989 Other specified abnormal findings of blood chemistry: Secondary | ICD-10-CM

## 2015-04-05 DIAGNOSIS — R946 Abnormal results of thyroid function studies: Secondary | ICD-10-CM | POA: Diagnosis not present

## 2015-04-05 LAB — COMPREHENSIVE METABOLIC PANEL
ALK PHOS: 95 U/L (ref 39–117)
ALT: 16 U/L (ref 0–35)
AST: 21 U/L (ref 0–37)
Albumin: 3.9 g/dL (ref 3.5–5.2)
BUN: 12 mg/dL (ref 6–23)
CALCIUM: 9.4 mg/dL (ref 8.4–10.5)
CO2: 28 meq/L (ref 19–32)
CREATININE: 0.8 mg/dL (ref 0.40–1.20)
Chloride: 108 mEq/L (ref 96–112)
GFR: 77.17 mL/min (ref >60.00)
GLUCOSE: 106 mg/dL — AB (ref 70–99)
POTASSIUM: 4.3 meq/L (ref 3.5–5.1)
Sodium: 143 mEq/L (ref 135–145)
TOTAL PROTEIN: 6.5 g/dL (ref 6.0–8.3)
Total Bilirubin: 0.5 mg/dL (ref 0.2–1.2)

## 2015-04-05 LAB — LIPID PANEL
Cholesterol: 161 mg/dL (ref 0–200)
HDL: 50.6 mg/dL (ref >39.00)
LDL CALC: 90 mg/dL (ref 0–99)
NonHDL: 110.67
Total CHOL/HDL Ratio: 3
Triglycerides: 104 mg/dL (ref 0.0–149.0)
VLDL: 20.8 mg/dL (ref 0.0–40.0)

## 2015-04-05 LAB — T3, FREE: T3, Free: 3.1 pg/mL (ref 2.3–4.2)

## 2015-04-05 LAB — T4, FREE: Free T4: 0.64 ng/dL (ref 0.60–1.60)

## 2015-04-05 LAB — TSH: TSH: 0.12 u[IU]/mL — ABNORMAL LOW (ref 0.35–4.50)

## 2015-04-09 ENCOUNTER — Encounter: Payer: Self-pay | Admitting: Internal Medicine

## 2015-04-09 ENCOUNTER — Ambulatory Visit (INDEPENDENT_AMBULATORY_CARE_PROVIDER_SITE_OTHER): Payer: Managed Care, Other (non HMO) | Admitting: Internal Medicine

## 2015-04-09 ENCOUNTER — Other Ambulatory Visit (HOSPITAL_COMMUNITY)
Admission: RE | Admit: 2015-04-09 | Discharge: 2015-04-09 | Disposition: A | Discharge status: Home / Self Care | Payer: Managed Care, Other (non HMO) | Source: Ambulatory Visit | Attending: Internal Medicine | Admitting: Internal Medicine

## 2015-04-09 VITALS — BP 118/78 | HR 80 | Temp 97.9°F | Ht 61.0 in | Wt 162.1 lb

## 2015-04-09 DIAGNOSIS — R7989 Other specified abnormal findings of blood chemistry: Secondary | ICD-10-CM

## 2015-04-09 DIAGNOSIS — Z124 Encounter for screening for malignant neoplasm of cervix: Secondary | ICD-10-CM

## 2015-04-09 DIAGNOSIS — M542 Cervicalgia: Secondary | ICD-10-CM

## 2015-04-09 DIAGNOSIS — Z1151 Encounter for screening for human papillomavirus (HPV): Secondary | ICD-10-CM | POA: Insufficient documentation

## 2015-04-09 DIAGNOSIS — Z0000 Encounter for general adult medical examination without abnormal findings: Secondary | ICD-10-CM

## 2015-04-09 DIAGNOSIS — R928 Other abnormal and inconclusive findings on diagnostic imaging of breast: Secondary | ICD-10-CM

## 2015-04-09 DIAGNOSIS — E7800 Pure hypercholesterolemia, unspecified: Secondary | ICD-10-CM

## 2015-04-09 DIAGNOSIS — Z860100 Personal history of colon polyps, unspecified: Secondary | ICD-10-CM

## 2015-04-09 DIAGNOSIS — Z8669 Personal history of other diseases of the nervous system and sense organs: Secondary | ICD-10-CM

## 2015-04-09 DIAGNOSIS — Z01419 Encounter for gynecological examination (general) (routine) without abnormal findings: Secondary | ICD-10-CM | POA: Insufficient documentation

## 2015-04-09 DIAGNOSIS — Z1239 Encounter for other screening for malignant neoplasm of breast: Secondary | ICD-10-CM

## 2015-04-09 NOTE — Progress Notes (Signed)
[name], female   DOB: 12-27-1950, 64 y.o.   MRN: [id number]   Subjective:    [name], female    DOB: [birth year], 64 y.o.   MRN: [id number]  HPI  Patient here to follow up on her current medical issues as well as for her complete physical exam.  Tries to stay active.  No cardiac symptoms with increased activity or exertion.  No sob.  No increased cough or congestion.  No acid reflux reported.  Bowels stable.  She is still having issues with her left arm.  Some numbness.  Aggravated with holding up her arm and putting on her make up.  She had MRI.  See report.  Saw surgery.  Felt - hold on any further intervention.  We discussed today regarding further w/up.  Discussed NCS.  She wants to hold at this time.     Past Medical History  Diagnosis Date  . Hyperlipidemia   . Migraines   . Hypertension   . Allergy   . Personal history of colonic polyps    Family history and social history reviewed.    Outpatient Encounter Prescriptions as of 04/09/2015  Medication Sig  . calcium carbonate (OS-CAL) 600 MG TABS Take 600 mg by mouth daily.  Sarajane Marek Sodium 30-100 MG CAPS Take 100 mg by mouth 2 (two) times daily.  . cholecalciferol (VITAMIN D) 1000 UNITS tablet Take 1,000 Units by mouth daily.  . cyclobenzaprine (FLEXERIL) 5 MG tablet Take 1 tablet (5 mg total) by mouth 3 (three) times daily as needed for muscle spasms.  Marland Kitchen estradiol (ESTRACE) 0.1 MG/GM vaginal cream Use as directed  . fexofenadine (ALLEGRA) 180 MG tablet Take 180 mg by mouth daily.  . Multiple Vitamins-Minerals ([employer] SPECTRAVITE ADULT 50+) TABS Take 1 tablet by mouth daily.  . simvastatin (ZOCOR) 10 MG tablet Take 1 tablet (10 mg total) by mouth daily at 6 PM.  . SUMAtriptan (IMITREX) 100 MG tablet Take 1 tablet (100 mg total) by mouth every 2 (two) hours as needed for migraine (Take 1 tablet by mouth as directed at onset of headache, then may repeat in 2 hours if no  relief.).  Marland Kitchen zonisamide (ZONEGRAN) 100 MG capsule Take 1 capsule (100 mg total) by mouth at bedtime.  . [DISCONTINUED] sulfamethoxazole-trimethoprim (BACTRIM DS,SEPTRA DS) 800-160 MG per tablet Take 1 tablet by mouth 2 (two) times daily.   No facility-administered encounter medications on file as of 04/09/2015.    Review of Systems  Constitutional: Negative for appetite change and unexpected weight change.  HENT: Negative for congestion and sinus pressure.   Eyes: Negative for pain and visual disturbance.  Respiratory: Negative for cough, chest tightness and shortness of breath.   Cardiovascular: Negative for chest pain, palpitations and leg swelling.  Gastrointestinal: Negative for nausea, vomiting, abdominal pain and diarrhea.  Genitourinary: Negative for dysuria and difficulty urinating.  Musculoskeletal: Negative for back pain and joint swelling.  Skin: Negative for color change, pallor and rash.  Neurological: Negative for dizziness, light-headedness and headaches.       Numbness in her arm as outlined.  Persistent.  Bothers her with applying her make up, etc.    Hematological: Negative for adenopathy. Does not bruise/bleed easily.  Psychiatric/Behavioral: Negative for dysphoric mood and agitation.       Objective:    Physical Exam  Constitutional: She is oriented to person, place, and time. She appears well-developed and well-nourished.  HENT:  Nose: Nose normal.  Mouth/Throat: Oropharynx is clear and moist.  Eyes: Right eye exhibits no discharge. Left eye exhibits no discharge. No scleral icterus.  Neck: Neck supple. No thyromegaly present.  Cardiovascular: Normal rate and regular rhythm.   Pulmonary/Chest: Breath sounds normal. No accessory muscle usage. No tachypnea. No respiratory distress. She has no decreased breath sounds. She has no wheezes. She has no rhonchi. Right breast exhibits no inverted nipple, no mass, no nipple discharge and no tenderness (no axillary  adenopathy). Left breast exhibits no inverted nipple, no mass, no nipple discharge and no tenderness (no axilarry adenopathy).  Abdominal: Soft. Bowel sounds are normal. There is no tenderness.  Genitourinary:  Normal external genitalia.  Vaginal vault without lesions.  S/P hysterectomy.  Pap smear performed.  Could not appreciate any adnexal masses or tenderness.    Musculoskeletal: She exhibits no edema or tenderness.  Lymphadenopathy:    She has no cervical adenopathy.  Neurological: She is alert and oriented to person, place, and time.  Skin: Skin is warm. No rash noted.  Psychiatric: She has a normal mood and affect. Her behavior is normal.    BP 118/78 mmHg  Pulse 80  Temp(Src) 97.9 F (36.6 C) (Oral)  Ht 5\' 1"  (1.549 m)  Wt 162 lb 2 oz (73.539 kg)  BMI 30.65 kg/m2  SpO2 97%  LMP 10/18/2012 Wt Readings from Last 3 Encounters:  04/09/15 162 lb 2 oz (73.539 kg)  10/23/14 150 lb 12.8 oz (68.402 kg)  10/09/14 150 lb 8 oz (68.266 kg)     Lab Results  Component Value Date   WBC 6.8 08/06/2014   HGB 12.8 08/06/2014   HCT 39.0 08/06/2014   PLT 197.0 08/06/2014   GLUCOSE 106* 04/05/2015   CHOL 161 04/05/2015   TRIG 104.0 04/05/2015   HDL 50.60 04/05/2015   LDLCALC 90 04/05/2015   ALT 16 04/05/2015   AST 21 04/05/2015   NA 143 04/05/2015   K 4.3 04/05/2015   CL 108 04/05/2015   CREATININE 0.80 04/05/2015   BUN 12 04/05/2015   CO2 28 04/05/2015   TSH 0.12* 04/05/2015       Assessment & Plan:   Problem List Items Addressed This Visit    Abnormal mammogram    Mammogram 06/13/14 - [facility] III.  F/u right breast mammogram in 12/10/14 - Birads I.  Recommended f/u bilateral mammogram in 05/2015.  Scheduled.        History of colonic polyps    Colonoscopy 01/30/12.  Due f/u colonoscopy in 01/29/17.        History of migraine headaches    Worked up at headache clinic.  Doing well.  Follow.        Hypercholesterolemia    On simvastatin.  Low cholesterol diet and  exercise.  Follow lipid panel and liver function tests.        Neck pain - Primary    Had MRI c-spine.  See report.  Saw surgery.  Felt no further intervention warranted at that time.  With the increased arm numbness as outlined.  Discussed further w/up.  Discussed NCS.  She wants to hold at this time.  Follow.  She wears a splint.         Other Visit Diagnoses    Breast cancer screening        Relevant Orders    MM DIGITAL SCREENING BILATERAL    Pap smear for cervical cancer screening        Relevant Orders    Cytology -  PAP    Low TSH level        Relevant Orders    TSH    T4, free    T3, free        Havyn Ramo, Randell Patient, MD

## 2015-04-09 NOTE — Progress Notes (Signed)
Pre visit review using our clinic review tool, if applicable. No additional management support is needed unless otherwise documented below in the visit note. 

## 2015-04-11 ENCOUNTER — Encounter: Payer: Self-pay | Admitting: Internal Medicine

## 2015-04-11 NOTE — Assessment & Plan Note (Signed)
Worked up at headache clinic.  Doing well.  Follow.

## 2015-04-11 NOTE — Assessment & Plan Note (Signed)
Mammogram 06/13/14 - Birads III.  F/u right breast mammogram in 12/10/14 - Birads I.  Recommended f/u bilateral mammogram in 05/2015.  Scheduled.

## 2015-04-11 NOTE — Assessment & Plan Note (Signed)
On simvastatin.  Low cholesterol diet and exercise.  Follow lipid panel and liver function tests.   

## 2015-04-11 NOTE — Assessment & Plan Note (Signed)
Colonoscopy 01/30/12.  Due f/u colonoscopy in 01/29/17.

## 2015-04-11 NOTE — Assessment & Plan Note (Addendum)
Had MRI c-spine.  See report.  Saw surgery.  Felt no further intervention warranted at that time.  With the increased arm numbness as outlined.  Discussed further w/up.  Discussed NCS.  She wants to hold at this time.  Follow.  She wears a splint.

## 2015-04-12 ENCOUNTER — Encounter: Payer: Self-pay | Admitting: Internal Medicine

## 2015-04-12 LAB — CYTOLOGY - PAP

## 2015-04-13 ENCOUNTER — Encounter: Payer: Self-pay | Admitting: Internal Medicine

## 2015-04-13 DIAGNOSIS — Z0000 Encounter for general adult medical examination without abnormal findings: Secondary | ICD-10-CM | POA: Insufficient documentation

## 2015-04-14 NOTE — Telephone Encounter (Signed)
Unread mychart message mailed to patient 

## 2015-04-15 NOTE — Telephone Encounter (Signed)
Unread mychart message mailed to patient 

## 2015-05-21 ENCOUNTER — Other Ambulatory Visit (INDEPENDENT_AMBULATORY_CARE_PROVIDER_SITE_OTHER): Payer: Managed Care, Other (non HMO)

## 2015-05-21 DIAGNOSIS — R7989 Other specified abnormal findings of blood chemistry: Secondary | ICD-10-CM

## 2015-05-21 DIAGNOSIS — R946 Abnormal results of thyroid function studies: Secondary | ICD-10-CM

## 2015-05-21 LAB — TSH: TSH: 0.11 u[IU]/mL — AB (ref 0.35–4.50)

## 2015-05-21 LAB — T4, FREE: Free T4: 0.71 ng/dL (ref 0.60–1.60)

## 2015-05-21 LAB — T3, FREE: T3 FREE: 3.7 pg/mL (ref 2.3–4.2)

## 2015-05-22 ENCOUNTER — Encounter: Payer: Self-pay | Admitting: Internal Medicine

## 2015-05-25 NOTE — Telephone Encounter (Signed)
Unread mychart message mailed to patient 

## 2015-06-08 ENCOUNTER — Ambulatory Visit
Admission: RE | Admit: 2015-06-08 | Discharge: 2015-06-08 | Disposition: A | Discharge status: Home / Self Care | Payer: Managed Care, Other (non HMO) | Source: Ambulatory Visit | Attending: Internal Medicine | Admitting: Internal Medicine

## 2015-06-08 DIAGNOSIS — Z1239 Encounter for other screening for malignant neoplasm of breast: Secondary | ICD-10-CM

## 2015-06-08 DIAGNOSIS — Z1231 Encounter for screening mammogram for malignant neoplasm of breast: Secondary | ICD-10-CM | POA: Insufficient documentation

## 2015-06-08 HISTORY — DX: Malignant (primary) neoplasm, unspecified: C80.1

## 2015-06-24 ENCOUNTER — Encounter: Payer: Self-pay | Admitting: Internal Medicine

## 2015-06-24 DIAGNOSIS — G43709 Chronic migraine without aura, not intractable, without status migrainosus: Secondary | ICD-10-CM | POA: Insufficient documentation

## 2015-06-25 ENCOUNTER — Encounter: Payer: Self-pay | Admitting: Internal Medicine

## 2015-06-25 ENCOUNTER — Ambulatory Visit
Admission: RE | Admit: 2015-06-25 | Discharge: 2015-06-25 | Disposition: A | Discharge status: Home / Self Care | Payer: Managed Care, Other (non HMO) | Source: Ambulatory Visit | Attending: Internal Medicine | Admitting: Internal Medicine

## 2015-06-25 ENCOUNTER — Ambulatory Visit (INDEPENDENT_AMBULATORY_CARE_PROVIDER_SITE_OTHER): Payer: Managed Care, Other (non HMO) | Admitting: Internal Medicine

## 2015-06-25 VITALS — BP 110/72 | HR 100 | Temp 98.2°F | Resp 18 | Wt 159.8 lb

## 2015-06-25 DIAGNOSIS — R062 Wheezing: Secondary | ICD-10-CM | POA: Diagnosis not present

## 2015-06-25 DIAGNOSIS — R8299 Other abnormal findings in urine: Secondary | ICD-10-CM | POA: Diagnosis not present

## 2015-06-25 DIAGNOSIS — R8290 Unspecified abnormal findings in urine: Secondary | ICD-10-CM

## 2015-06-25 DIAGNOSIS — R05 Cough: Secondary | ICD-10-CM | POA: Diagnosis not present

## 2015-06-25 DIAGNOSIS — R059 Cough, unspecified: Secondary | ICD-10-CM

## 2015-06-25 DIAGNOSIS — I517 Cardiomegaly: Secondary | ICD-10-CM | POA: Insufficient documentation

## 2015-06-25 DIAGNOSIS — J069 Acute upper respiratory infection, unspecified: Secondary | ICD-10-CM

## 2015-06-25 DIAGNOSIS — R1900 Intra-abdominal and pelvic swelling, mass and lump, unspecified site: Secondary | ICD-10-CM

## 2015-06-25 MED ORDER — LEVOFLOXACIN 500 MG PO TABS: 500.0000 mg | ORAL_TABLET | Freq: Every day | ORAL | Status: DC

## 2015-06-25 MED ORDER — PREDNISONE 10 MG PO TABS: ORAL_TABLET | ORAL | Status: DC

## 2015-06-25 NOTE — Patient Instructions (Signed)
Saline nasal spray - flush nose at least 2-3x/day  nasacort nasal spray - 2 sprays each nostril one time per day.  Do this in the evening.    Align (probiotic) - take one tablet everyday while you are on the antibiotics and for two weeks after completing the antibiotics.

## 2015-06-25 NOTE — Progress Notes (Signed)
[name], female   DOB: 07-Nov-1950, 64 y.o.   MRN: [id number]   Subjective:    [name], female    DOB: [birth year], 65 y.o.   MRN: [id number]  HPI  Patient with past history of hypertension, allergies and hypercholesterolemia.  She comes in today as a work in with concerns regarding increased congestion and cough.  States that her symptoms started approximately one week ago.  Has progressed.  Increased sinus pressure and congestion.  Blood tinged mucus - nasal congestion.  Some chills.  Used vicks vapor rub.  Right ear feels a little stopped up.  Feels like talking in a barrell.  Decreased appetite.  No vomiting or diarrhea.  Increased cough and congestion.  Has taken three doses of augmentin.   No abdominal pain or cramping.  Bowels stable.     Past Medical History  Diagnosis Date  . Hyperlipidemia   . Migraines   . Hypertension   . Allergy   . Personal history of colonic polyps   . Cancer ([place])     basal cell   Past Surgical History  Procedure Laterality Date  . Tubal ligation    . Finger surgery    . Cholecystectomy  2006  . Vaginal hysterectomy      midurethral sling  . Hysteroscopy  2011, 10/2010   Family History  Problem Relation Age of Onset  . Hypertension Mother   . Stroke Mother   . Hypercholesterolemia Father   . Diabetes Mother   . Heart disease Father     MI  . Hypercholesterolemia Father   . Hypertension Father   . Stroke Father   . Breast cancer Neg Hx   . Colon cancer Neg Hx    Social History   Social History  . Marital Status: Married    Spouse Name: N/A  . Number of Children: 2  . Years of Education: N/A   Social History Main Topics  . Smoking status: Never Smoker   . Smokeless tobacco: Never Used  . Alcohol Use: 0.0 oz/week    0 Standard drinks or equivalent per week     Comment: Socially  . Drug Use: No  . Sexual Activity: Not Asked   Other Topics Concern  . None   Social History  Narrative    Outpatient Encounter Prescriptions as of 06/25/2015  Medication Sig  . calcium carbonate (OS-CAL) 600 MG TABS Take 600 mg by mouth daily.  Sarajane Marek Sodium 30-100 MG CAPS Take 100 mg by mouth 2 (two) times daily.  . cholecalciferol (VITAMIN D) 1000 UNITS tablet Take 1,000 Units by mouth daily.  . cyclobenzaprine (FLEXERIL) 5 MG tablet Take 1 tablet (5 mg total) by mouth 3 (three) times daily as needed for muscle spasms.  Marland Kitchen estradiol (ESTRACE) 0.1 MG/GM vaginal cream Use as directed  . fexofenadine (ALLEGRA) 180 MG tablet Take 180 mg by mouth daily.  . Multiple Vitamins-Minerals ([employer] SPECTRAVITE ADULT 50+) TABS Take 1 tablet by mouth daily.  . simvastatin (ZOCOR) 10 MG tablet Take 1 tablet (10 mg total) by mouth daily at 6 PM.  . SUMAtriptan (IMITREX) 100 MG tablet Take 1 tablet (100 mg total) by mouth every 2 (two) hours as needed for migraine (Take 1 tablet by mouth as directed at onset of headache, then may repeat in 2 hours if no relief.).  Marland Kitchen zonisamide (ZONEGRAN) 100 MG capsule Take 1 capsule (100 mg total) by mouth at bedtime.  Marland Kitchen levofloxacin (  LEVAQUIN) 500 MG tablet Take 1 tablet (500 mg total) by mouth daily.  . predniSONE (DELTASONE) 10 MG tablet Take 4 tablets x 1 day and then decrease by 1/2 tablet per day until down to zero mg.   No facility-administered encounter medications on file as of 06/25/2015.    Review of Systems  Constitutional: Positive for chills and appetite change (decreased appetite).  HENT: Positive for congestion (nasal congestion with blood tinged mucus. ) and sinus pressure.        Ear fullness - right.    Eyes: Negative for discharge and visual disturbance.  Respiratory: Positive for cough. Negative for chest tightness and shortness of breath.   Cardiovascular: Negative for chest pain, palpitations and leg swelling.  Gastrointestinal: Negative for nausea, vomiting, abdominal pain and diarrhea.  Musculoskeletal: Negative for joint  swelling.  Skin: Negative for color change and rash.  Neurological: Negative for dizziness, light-headedness and headaches.       Objective:    Physical Exam  Constitutional: She appears well-developed and well-nourished. No distress.  HENT:  TMs visualized - no erythema.  Increased tenderness to palpation over the sinuses.  Nares - slightly erythematous turbinates.    Eyes: Conjunctivae are normal. Right eye exhibits no discharge. Left eye exhibits no discharge.  Neck: Neck supple.  Cardiovascular: Normal rate and regular rhythm.   Pulmonary/Chest: Breath sounds normal. No respiratory distress.  Increased cough with forced expiration.  Some increased congestion - at the bases.  Cleared some with coughing.    Musculoskeletal: She exhibits no edema or tenderness.  Lymphadenopathy:    She has no cervical adenopathy.  Skin: No rash noted. No erythema.  Psychiatric: She has a normal mood and affect. Her behavior is normal.    BP 110/72 mmHg  Pulse 100  Temp(Src) 98.2 F (36.8 C) (Oral)  Resp 18  Wt 159 lb 12.8 oz (72.485 kg)  SpO2 93%  LMP 10/18/2012 Wt Readings from Last 3 Encounters:  06/25/15 159 lb 12.8 oz (72.485 kg)  04/09/15 162 lb 2 oz (73.539 kg)  10/23/14 150 lb 12.8 oz (68.402 kg)     Lab Results  Component Value Date   WBC 6.8 08/06/2014   HGB 12.8 08/06/2014   HCT 39.0 08/06/2014   PLT 197.0 08/06/2014   GLUCOSE 106* 04/05/2015   CHOL 161 04/05/2015   TRIG 104.0 04/05/2015   HDL 50.60 04/05/2015   LDLCALC 90 04/05/2015   ALT 16 04/05/2015   AST 21 04/05/2015   NA 143 04/05/2015   K 4.3 04/05/2015   CL 108 04/05/2015   CREATININE 0.80 04/05/2015   BUN 12 04/05/2015   CO2 28 04/05/2015   TSH 0.11* 05/21/2015    Mm Digital Screening Bilateral  06/09/2015  CLINICAL DATA:  Screening. EXAM: DIGITAL SCREENING BILATERAL MAMMOGRAM WITH CAD COMPARISON:  Previous exam(s). ACR Breast Density Category a: The breast tissue is almost entirely fatty. FINDINGS:  There are no findings suspicious for malignancy. Images were processed with CAD. IMPRESSION: No mammographic evidence of malignancy. A result letter of this screening mammogram will be mailed directly to the patient. RECOMMENDATION: Screening mammogram in one year. (Code:SM-B-01Y) BI-RADS CATEGORY  1: Negative. Electronically Signed   By: Ammie Ferrier M.D.   On: 06/09/2015 07:57       Assessment & Plan:   Problem List Items Addressed This Visit    Pelvic fullness    Feels that she may also have a urinary tract infection.  Check urinalysis and urine culture.  levaquin should cover.  Follow.        Relevant Orders   Urinalysis, Routine w reflex microscopic (not at [facility])   CULTURE, URINE COMPREHENSIVE   URI (upper respiratory infection)    Increased cough and congestion as outlined.  Increased sinus pressure and congestion.  Felt to be c/w sinusitis and URI/bronchitis.  Treat with saline nasal spray and nasacort nasal spray as directed.  Mucinex/robitussin as directed.  Levaquin and prednisone taper as directed.  Check cxr.  Rest.  Fluids. Follow closely.  Explained to her if symptoms change, worsen or do not resolve - needs to be reevaluated.         Other Visit Diagnoses    Cough    -  Primary    Relevant Orders    DG Chest 2 View (Completed)    Wheezing        Relevant Orders    DG Chest 2 View (Completed)    Cloudy urine            Einar Pheasant, MD

## 2015-06-25 NOTE — Telephone Encounter (Signed)
Can you put her on my schedule today at 1:00.  Thanks.    Dr Nicki Reaper

## 2015-06-25 NOTE — Progress Notes (Signed)
Pre visit review using our clinic review tool, if applicable. No additional management support is needed unless otherwise documented below in the visit note. 

## 2015-06-27 ENCOUNTER — Encounter: Payer: Self-pay | Admitting: Internal Medicine

## 2015-06-27 ENCOUNTER — Other Ambulatory Visit: Payer: Self-pay | Admitting: Internal Medicine

## 2015-06-27 DIAGNOSIS — J069 Acute upper respiratory infection, unspecified: Secondary | ICD-10-CM | POA: Insufficient documentation

## 2015-06-27 DIAGNOSIS — R1900 Intra-abdominal and pelvic swelling, mass and lump, unspecified site: Secondary | ICD-10-CM | POA: Insufficient documentation

## 2015-06-27 NOTE — Telephone Encounter (Signed)
Since Kristi Greer is not in the office on Monday (and since I am not in the office), can you take care of this for me.  Please print the letter to remain out of work and fax to the person and number she has provided in her my chart message.   Thanks.

## 2015-06-27 NOTE — Assessment & Plan Note (Signed)
Increased cough and congestion as outlined.  Increased sinus pressure and congestion.  Felt to be c/w sinusitis and URI/bronchitis.  Treat with saline nasal spray and nasacort nasal spray as directed.  Mucinex/robitussin as directed.  Levaquin and prednisone taper as directed.  Check cxr.  Rest.  Fluids. Follow closely.  Explained to her if symptoms change, worsen or do not resolve - needs to be reevaluated.

## 2015-06-27 NOTE — Assessment & Plan Note (Signed)
Feels that she may also have a urinary tract infection.  Check urinalysis and urine culture.  levaquin should cover.  Follow.

## 2015-06-29 ENCOUNTER — Other Ambulatory Visit (INDEPENDENT_AMBULATORY_CARE_PROVIDER_SITE_OTHER): Payer: Managed Care, Other (non HMO)

## 2015-06-29 DIAGNOSIS — R1900 Intra-abdominal and pelvic swelling, mass and lump, unspecified site: Secondary | ICD-10-CM

## 2015-06-29 LAB — URINALYSIS, ROUTINE W REFLEX MICROSCOPIC
Bilirubin Urine: NEGATIVE
Ketones, ur: NEGATIVE
Nitrite: NEGATIVE
Specific Gravity, Urine: >=1.03 — AB (ref 1.000–1.030)
URINE GLUCOSE: NEGATIVE
UROBILINOGEN UA: 0.2 (ref 0.0–1.0)
pH: 5.5 (ref 5.0–8.0)

## 2015-07-19 ENCOUNTER — Encounter: Payer: Self-pay | Admitting: Internal Medicine

## 2015-07-19 DIAGNOSIS — R9389 Abnormal findings on diagnostic imaging of other specified body structures: Secondary | ICD-10-CM

## 2015-07-20 NOTE — Telephone Encounter (Signed)
Order placed for cxr.  Pt notified via my chart.

## 2015-07-21 ENCOUNTER — Encounter: Payer: Self-pay | Admitting: *Deleted

## 2015-07-23 ENCOUNTER — Ambulatory Visit
Admission: RE | Admit: 2015-07-23 | Discharge: 2015-07-23 | Disposition: A | Discharge status: Home / Self Care | Payer: Managed Care, Other (non HMO) | Source: Ambulatory Visit | Attending: Internal Medicine | Admitting: Internal Medicine

## 2015-07-23 DIAGNOSIS — R938 Abnormal findings on diagnostic imaging of other specified body structures: Secondary | ICD-10-CM | POA: Diagnosis present

## 2015-07-23 DIAGNOSIS — I517 Cardiomegaly: Secondary | ICD-10-CM | POA: Insufficient documentation

## 2015-07-23 DIAGNOSIS — R9389 Abnormal findings on diagnostic imaging of other specified body structures: Secondary | ICD-10-CM

## 2015-07-24 ENCOUNTER — Encounter: Payer: Self-pay | Admitting: Internal Medicine

## 2015-07-27 NOTE — Telephone Encounter (Signed)
Unread mychart message mailed to patient 

## 2015-08-09 ENCOUNTER — Encounter: Payer: Self-pay | Admitting: Internal Medicine

## 2015-08-09 ENCOUNTER — Ambulatory Visit (INDEPENDENT_AMBULATORY_CARE_PROVIDER_SITE_OTHER): Payer: Managed Care, Other (non HMO) | Admitting: Internal Medicine

## 2015-08-09 VITALS — BP 110/80 | HR 110 | Temp 98.4°F | Resp 18 | Ht 61.0 in | Wt 166.5 lb

## 2015-08-09 DIAGNOSIS — E7800 Pure hypercholesterolemia, unspecified: Secondary | ICD-10-CM

## 2015-08-09 DIAGNOSIS — R7989 Other specified abnormal findings of blood chemistry: Secondary | ICD-10-CM

## 2015-08-09 DIAGNOSIS — J069 Acute upper respiratory infection, unspecified: Secondary | ICD-10-CM

## 2015-08-09 DIAGNOSIS — R0789 Other chest pain: Secondary | ICD-10-CM

## 2015-08-09 DIAGNOSIS — M79644 Pain in right finger(s): Secondary | ICD-10-CM

## 2015-08-09 DIAGNOSIS — I517 Cardiomegaly: Secondary | ICD-10-CM

## 2015-08-09 DIAGNOSIS — R946 Abnormal results of thyroid function studies: Secondary | ICD-10-CM

## 2015-08-09 DIAGNOSIS — R0609 Other forms of dyspnea: Secondary | ICD-10-CM

## 2015-08-09 DIAGNOSIS — Z86010 Personal history of colonic polyps: Secondary | ICD-10-CM | POA: Diagnosis not present

## 2015-08-09 DIAGNOSIS — R928 Other abnormal and inconclusive findings on diagnostic imaging of breast: Secondary | ICD-10-CM

## 2015-08-09 NOTE — Progress Notes (Signed)
Pre-visit discussion using our clinic review tool. No additional management support is needed unless otherwise documented below in the visit note.  

## 2015-08-09 NOTE — Progress Notes (Signed)
Patient ID: Ghina Mehrhoff, female   DOB: [birth year], 64 y.o.   MRN: [id number]   Subjective:    Patient ID: [name], female    DOB: 17-Jul-1951, 64 y.o.   MRN: [id number]  HPI  Patient with past history of hypercholesterolemia, allergies and hypertension.  She comes in today to follow up on these issues.   She was seen recently for cough and congestion.  See previous note for details.   Cough and congestion is better.  Still has some cough - at night.  Just occasional cough.  CXR with documented cardiomegaly with mild interstitial prominence.  Interstitial prominence improved with treatment of cough and congestion.  She does report getting a little more winded with climbing stairs and increased exertion.  No chest pain.  Reports some "indigestion" occasionally when lying down.  Described as chest pressure.  This resolves with belching.  No abdominal pain or cramping.  Bowels stable.  Increased pain - base of right thumb.     Past Medical History  Diagnosis Date  . Hyperlipidemia   . Migraines   . Hypertension   . Allergy   . Personal history of colonic polyps   . Cancer ([place])     basal cell   Past Surgical History  Procedure Laterality Date  . Tubal ligation    . Finger surgery    . Cholecystectomy  2006  . Vaginal hysterectomy      midurethral sling  . Hysteroscopy  2011, 10/2010   Family History  Problem Relation Age of Onset  . Hypertension Mother   . Stroke Mother   . Hypercholesterolemia Father   . Diabetes Mother   . Heart disease Father     MI  . Hypercholesterolemia Father   . Hypertension Father   . Stroke Father   . Breast cancer Neg Hx   . Colon cancer Neg Hx    Social History   Social History  . Marital Status: Married    Spouse Name: N/A  . Number of Children: 2  . Years of Education: N/A   Social History Main Topics  . Smoking status: Never Smoker   . Smokeless tobacco: Never Used  . Alcohol Use: 0.0 oz/week    0 Standard  drinks or equivalent per week     Comment: Socially  . Drug Use: No  . Sexual Activity: Not Asked   Other Topics Concern  . None   Social History Narrative    Outpatient Encounter Prescriptions as of 08/09/2015  Medication Sig  . calcium carbonate (OS-CAL) 600 MG TABS Take 600 mg by mouth daily.  . cholecalciferol (VITAMIN D) 1000 UNITS tablet Take 1,000 Units by mouth daily.  Marland Kitchen estradiol (ESTRACE) 0.1 MG/GM vaginal cream Use as directed  . fexofenadine (ALLEGRA) 180 MG tablet Take 180 mg by mouth daily.  . Multiple Vitamins-Minerals ([employer] SPECTRAVITE ADULT 50+) TABS Take 1 tablet by mouth daily.  . simvastatin (ZOCOR) 10 MG tablet Take 1 tablet (10 mg total) by mouth daily at 6 PM.  . SUMAtriptan (IMITREX) 100 MG tablet Take 1 tablet (100 mg total) by mouth every 2 (two) hours as needed for migraine (Take 1 tablet by mouth as directed at onset of headache, then may repeat in 2 hours if no relief.).  Marland Kitchen zonisamide (ZONEGRAN) 100 MG capsule Take 1 capsule (100 mg total) by mouth at bedtime.  . [DISCONTINUED] Casanthranol-Docusate Sodium 30-100 MG CAPS Take 100 mg by mouth 2 (two) times daily.  . [  DISCONTINUED] cyclobenzaprine (FLEXERIL) 5 MG tablet Take 1 tablet (5 mg total) by mouth 3 (three) times daily as needed for muscle spasms.  . [DISCONTINUED] levofloxacin (LEVAQUIN) 500 MG tablet Take 1 tablet (500 mg total) by mouth daily.  . [DISCONTINUED] predniSONE (DELTASONE) 10 MG tablet Take 4 tablets x 1 day and then decrease by 1/2 tablet per day until down to zero mg.   No facility-administered encounter medications on file as of 08/09/2015.    Review of Systems  Constitutional: Negative for appetite change and unexpected weight change.  HENT: Negative for congestion and sinus pressure.   Eyes: Negative for pain and discharge.  Respiratory:       Still occasional cough.  No increased congestion.  Increased sob with climbing stairs and some exertion.    Cardiovascular: Negative for  palpitations and leg swelling.       Some chest pressure with lying down.  Relieved with belching.    Gastrointestinal: Negative for nausea, vomiting, abdominal pain and diarrhea.  Genitourinary: Negative for dysuria and difficulty urinating.  Musculoskeletal: Negative for back pain.       Increased pain - base of right thumb.    Skin: Negative for color change and rash.  Neurological: Negative for dizziness and light-headedness.  Psychiatric/Behavioral: Negative for dysphoric mood and agitation.       Objective:    Physical Exam  Constitutional: She appears well-developed and well-nourished. No distress.  HENT:  Nose: Nose normal.  Mouth/Throat: Oropharynx is clear and moist.  Eyes: Conjunctivae are normal. Right eye exhibits no discharge. Left eye exhibits no discharge.  Neck: Neck supple. No thyromegaly present.  Cardiovascular: Normal rate and regular rhythm.   Pulmonary/Chest: Breath sounds normal. No respiratory distress. She has no wheezes.  Abdominal: Soft. Bowel sounds are normal. There is no tenderness.  Musculoskeletal: She exhibits no edema or tenderness.  Increased pain - base of right thumb.    Lymphadenopathy:    She has no cervical adenopathy.  Skin: No rash noted. No erythema.  Psychiatric: She has a normal mood and affect. Her behavior is normal.    BP 110/80 mmHg  Pulse 110  Temp(Src) 98.4 F (36.9 C) (Oral)  Resp 18  Ht 5\' 1"  (1.549 m)  Wt 166 lb 8 oz (75.524 kg)  BMI 31.48 kg/m2  SpO2 96%  LMP 10/18/2012 Wt Readings from Last 3 Encounters:  08/09/15 166 lb 8 oz (75.524 kg)  06/25/15 159 lb 12.8 oz (72.485 kg)  04/09/15 162 lb 2 oz (73.539 kg)     Lab Results  Component Value Date   WBC 6.8 08/06/2014   HGB 12.8 08/06/2014   HCT 39.0 08/06/2014   PLT 197.0 08/06/2014   GLUCOSE 106* 04/05/2015   CHOL 161 04/05/2015   TRIG 104.0 04/05/2015   HDL 50.60 04/05/2015   LDLCALC 90 04/05/2015   ALT 16 04/05/2015   AST 21 04/05/2015   NA 143  04/05/2015   K 4.3 04/05/2015   CL 108 04/05/2015   CREATININE 0.80 04/05/2015   BUN 12 04/05/2015   CO2 28 04/05/2015   TSH 0.11* 05/21/2015    Dg Chest 2 View  07/23/2015  CLINICAL DATA:  History of pneumonia. EXAM: CHEST  2 VIEW COMPARISON:  06/25/2015. FINDINGS: Mediastinum and hilar structures are normal. Cardiomegaly with mild pulmonary interstitial prominence. Pulmonary interstitial prominence is improved from prior exam suggesting improvement of congestive heart failure and/or pneumonitis. No pleural effusion or pneumothorax. No acute bony abnormality . IMPRESSION: Cardiomegaly with  mild pulmonary interstitial prominence. Interim improvement of interstitial prominence from prior study suggesting improving congestive heart failure and/or pneumonitis. Electronically Signed   By: Marcello Moores  Register   On: 07/23/2015 14:22       Assessment & Plan:   Problem List Items Addressed This Visit    Abnormal mammogram    Mammogram 06/09/15 - Birads I.       Chest pressure    EKG and cardiac w/up as outlined.  Treat with zantac as outlined.  Schedule f/u soon to reassess.        DOE (dyspnea on exertion)    Describes some worsening sob with climbing stairs and some exertion.  Given symptoms, EKG obtained and revealed SR with no acute ischemic changes when compared to previous EKG.  Cardiomegaly noted on xray.  Describes chest pressure when lying down.  Relieved with belching.  Will treat with zantac as directed.  Given the sob and cxr findings, etc - will have cardiology evaluated with question of need for further cardac w/up (i.e., echo, stress testing , etc).  Pt comfortable with this plan.        History of colonic polyps    Colonoscopy 01/2012.  Due f/u colonoscopy 01/2017.      Hypercholesterolemia    On simvastatin.  Low cholesterol diet and exercise.  Follow lipid panel and liver function tests.        Relevant Orders   Lipid panel   Hepatic function panel   Pain of right thumb      Increased pain - bast of right thumb.  Thumb spica.  Tylenol.  If persistent, will require referral for possible injection.  Avoid antiinflammatories given GI issues.        URI (upper respiratory infection)    Cough and congestion improved.  CXR as outlined.  Will need f/u cxr.  Will pursue further cardiac w/up.  If unrevealing and persistent symptoms, will require further pulmonary evaluation.         Other Visit Diagnoses    Cardiomegaly    -  Primary    Relevant Orders    EKG 12-Lead (Completed)    Basic metabolic panel    CBC with Differential/Platelet    Low TSH level        Relevant Orders    TSH    T3, free    T4, free      I spent 40 minutes with the patient and more than 50% of the time was spent in consultation regarding the above.     Einar Pheasant, MD

## 2015-08-10 ENCOUNTER — Encounter: Payer: Self-pay | Admitting: Internal Medicine

## 2015-08-10 DIAGNOSIS — R0789 Other chest pain: Secondary | ICD-10-CM | POA: Insufficient documentation

## 2015-08-10 DIAGNOSIS — R0609 Other forms of dyspnea: Secondary | ICD-10-CM | POA: Insufficient documentation

## 2015-08-10 DIAGNOSIS — M79644 Pain in right finger(s): Secondary | ICD-10-CM | POA: Insufficient documentation

## 2015-08-10 NOTE — Assessment & Plan Note (Signed)
Describes some worsening sob with climbing stairs and some exertion.  Given symptoms, EKG obtained and revealed SR with no acute ischemic changes when compared to previous EKG.  Cardiomegaly noted on xray.  Describes chest pressure when lying down.  Relieved with belching.  Will treat with zantac as directed.  Given the sob and cxr findings, etc - will have cardiology evaluated with question of need for further cardac w/up (i.e., echo, stress testing , etc).  Pt comfortable with this plan.

## 2015-08-10 NOTE — Assessment & Plan Note (Signed)
On simvastatin.  Low cholesterol diet and exercise.  Follow lipid panel and liver function tests.   

## 2015-08-10 NOTE — Assessment & Plan Note (Signed)
Cough and congestion improved.  CXR as outlined.  Will need f/u cxr.  Will pursue further cardiac w/up.  If unrevealing and persistent symptoms, will require further pulmonary evaluation.

## 2015-08-10 NOTE — Assessment & Plan Note (Signed)
EKG and cardiac w/up as outlined.  Treat with zantac as outlined.  Schedule f/u soon to reassess.

## 2015-08-10 NOTE — Assessment & Plan Note (Signed)
Mammogram 06/09/15 - Birads I.

## 2015-08-10 NOTE — Assessment & Plan Note (Signed)
Colonoscopy 01/2012.  Due f/u colonoscopy 01/2017.

## 2015-08-10 NOTE — Assessment & Plan Note (Signed)
Increased pain - bast of right thumb.  Thumb spica.  Tylenol.  If persistent, will require referral for possible injection.  Avoid antiinflammatories given GI issues.

## 2015-08-11 ENCOUNTER — Encounter: Payer: Self-pay | Admitting: *Deleted

## 2015-08-16 ENCOUNTER — Encounter: Payer: Self-pay | Admitting: Internal Medicine

## 2015-08-16 NOTE — Telephone Encounter (Signed)
Pt notified - can try saline nasal spray - flush nose at least 2-3x/day.  Also nasacort nasal spray - 2 sprays each nostril one time per day.  I would do this in the evening.  mucinex in the morning and robitussin in the evening.  If the symptoms do not improve or if they get any worse, you will need to be seen.

## 2015-08-17 ENCOUNTER — Other Ambulatory Visit: Payer: Self-pay | Admitting: Internal Medicine

## 2015-08-17 DIAGNOSIS — I517 Cardiomegaly: Secondary | ICD-10-CM

## 2015-08-17 DIAGNOSIS — R0602 Shortness of breath: Secondary | ICD-10-CM

## 2015-08-17 NOTE — Progress Notes (Signed)
Order placed for cardiology referral.   

## 2015-08-31 ENCOUNTER — Encounter: Payer: Self-pay | Admitting: Emergency Medicine

## 2015-08-31 ENCOUNTER — Emergency Department: Payer: Managed Care, Other (non HMO)

## 2015-08-31 ENCOUNTER — Encounter: Payer: Self-pay | Admitting: Internal Medicine

## 2015-08-31 ENCOUNTER — Inpatient Hospital Stay
Admission: EM | Admit: 2015-08-31 | Discharge: 2015-09-03 | DRG: 292 | Disposition: A | Discharge status: Home / Self Care | Payer: Managed Care, Other (non HMO) | Attending: Specialist | Admitting: Specialist

## 2015-08-31 DIAGNOSIS — I110 Hypertensive heart disease with heart failure: Principal | ICD-10-CM | POA: Diagnosis present

## 2015-08-31 DIAGNOSIS — I471 Supraventricular tachycardia, unspecified: Secondary | ICD-10-CM

## 2015-08-31 DIAGNOSIS — Z79899 Other long term (current) drug therapy: Secondary | ICD-10-CM

## 2015-08-31 DIAGNOSIS — I499 Cardiac arrhythmia, unspecified: Secondary | ICD-10-CM

## 2015-08-31 DIAGNOSIS — N3281 Overactive bladder: Secondary | ICD-10-CM | POA: Diagnosis present

## 2015-08-31 DIAGNOSIS — R778 Other specified abnormalities of plasma proteins: Secondary | ICD-10-CM

## 2015-08-31 DIAGNOSIS — R0602 Shortness of breath: Secondary | ICD-10-CM | POA: Diagnosis not present

## 2015-08-31 DIAGNOSIS — G43909 Migraine, unspecified, not intractable, without status migrainosus: Secondary | ICD-10-CM | POA: Diagnosis present

## 2015-08-31 DIAGNOSIS — I5021 Acute systolic (congestive) heart failure: Secondary | ICD-10-CM | POA: Diagnosis present

## 2015-08-31 DIAGNOSIS — I4891 Unspecified atrial fibrillation: Secondary | ICD-10-CM | POA: Diagnosis present

## 2015-08-31 DIAGNOSIS — I248 Other forms of acute ischemic heart disease: Secondary | ICD-10-CM | POA: Diagnosis present

## 2015-08-31 DIAGNOSIS — E0590 Thyrotoxicosis, unspecified without thyrotoxic crisis or storm: Secondary | ICD-10-CM | POA: Diagnosis present

## 2015-08-31 DIAGNOSIS — R7989 Other specified abnormal findings of blood chemistry: Secondary | ICD-10-CM

## 2015-08-31 DIAGNOSIS — E039 Hypothyroidism, unspecified: Secondary | ICD-10-CM | POA: Diagnosis present

## 2015-08-31 DIAGNOSIS — I442 Atrioventricular block, complete: Secondary | ICD-10-CM | POA: Diagnosis present

## 2015-08-31 DIAGNOSIS — E785 Hyperlipidemia, unspecified: Secondary | ICD-10-CM | POA: Diagnosis present

## 2015-08-31 DIAGNOSIS — I509 Heart failure, unspecified: Secondary | ICD-10-CM

## 2015-08-31 DIAGNOSIS — I959 Hypotension, unspecified: Secondary | ICD-10-CM | POA: Diagnosis present

## 2015-08-31 HISTORY — DX: Heart failure, unspecified: I50.9

## 2015-08-31 HISTORY — DX: Supraventricular tachycardia: I47.1

## 2015-08-31 HISTORY — DX: Cardiac arrhythmia, unspecified: I49.9

## 2015-08-31 HISTORY — DX: Supraventricular tachycardia, unspecified: I47.10

## 2015-08-31 LAB — COMPREHENSIVE METABOLIC PANEL
ALBUMIN: 4 g/dL (ref 3.5–5.0)
ALK PHOS: 90 U/L (ref 38–126)
ALT: 19 U/L (ref 14–54)
AST: 24 U/L (ref 15–41)
Anion gap: 8 (ref 5–15)
BILIRUBIN TOTAL: 0.5 mg/dL (ref 0.3–1.2)
BUN: 22 mg/dL — AB (ref 6–20)
CALCIUM: 9.2 mg/dL (ref 8.9–10.3)
CO2: 24 mmol/L (ref 22–32)
Chloride: 107 mmol/L (ref 101–111)
Creatinine, Ser: 0.85 mg/dL (ref 0.44–1.00)
GFR calc Af Amer: >60 mL/min (ref >60)
GLUCOSE: 117 mg/dL — AB (ref 65–99)
Potassium: 4.2 mmol/L (ref 3.5–5.1)
Sodium: 139 mmol/L (ref 135–145)
TOTAL PROTEIN: 7.6 g/dL (ref 6.5–8.1)

## 2015-08-31 LAB — CBC
HCT: 39.6 % (ref 35.0–47.0)
Hemoglobin: 12.9 g/dL (ref 12.0–16.0)
MCH: 27.9 pg (ref 26.0–34.0)
MCHC: 32.5 g/dL (ref 32.0–36.0)
MCV: 85.9 fL (ref 80.0–100.0)
PLATELETS: 317 10*3/uL (ref 150–440)
RBC: 4.61 MIL/uL (ref 3.80–5.20)
RDW: 14 % (ref 11.5–14.5)
WBC: 13.7 10*3/uL — ABNORMAL HIGH (ref 3.6–11.0)

## 2015-08-31 LAB — BRAIN NATRIURETIC PEPTIDE: B Natriuretic Peptide: 853 pg/mL — ABNORMAL HIGH (ref 0.0–100.0)

## 2015-08-31 LAB — LACTIC ACID, PLASMA: LACTIC ACID, VENOUS: 1.2 mmol/L (ref 0.5–2.0)

## 2015-08-31 LAB — TROPONIN I: Troponin I: 0.05 ng/mL — ABNORMAL HIGH (ref <0.031)

## 2015-08-31 MED ORDER — ADENOSINE 6 MG/2ML IV SOLN
6.0000 mg | Freq: Once | INTRAVENOUS | Status: AC
  Administered 2015-08-31: 6 mg via INTRAVENOUS

## 2015-08-31 MED ORDER — DILTIAZEM HCL 25 MG/5ML IV SOLN
5.0000 mg | Freq: Once | INTRAVENOUS | Status: AC
  Administered 2015-08-31: 5 mg via INTRAVENOUS

## 2015-08-31 MED ORDER — DILTIAZEM HCL 100 MG IV SOLR
5.0000 mg/h | Freq: Once | INTRAVENOUS | Status: AC
  Administered 2015-08-31: 15 mg/h via INTRAVENOUS
  Filled 2015-08-31: qty 100

## 2015-08-31 MED ORDER — DILTIAZEM HCL 25 MG/5ML IV SOLN
INTRAVENOUS | Status: AC
  Administered 2015-08-31: 5 mg via INTRAVENOUS
  Filled 2015-08-31: qty 5

## 2015-08-31 MED ORDER — ADENOSINE 12 MG/4ML IV SOLN
INTRAVENOUS | Status: AC
  Administered 2015-08-31: 6 mg via INTRAVENOUS
  Filled 2015-08-31: qty 4

## 2015-08-31 MED ORDER — ADENOSINE 12 MG/4ML IV SOLN
INTRAVENOUS | Status: AC
  Filled 2015-08-31: qty 4

## 2015-08-31 NOTE — ED Notes (Signed)
MD at bedside. 

## 2015-08-31 NOTE — ED Notes (Signed)
Troponin 0.05 verbal readback to Dr Cinda Quest, acknowledged and no further orders received.

## 2015-08-31 NOTE — ED Provider Notes (Signed)
[facility] Emergency Department Provider Note  ____________________________________________  Time seen: Approximately 10:05 PM  I have reviewed the triage vital signs and the nursing notes.   HISTORY  Chief Complaint Shortness of Breath    HPI Kristi Greer is a 65 y.o. female patient reports she's had intermittent palpitations and tachycardia along with shortness of breath since October. Last night she was having a lot of heart racing and had trouble laying down flat at night. Her daughter made her come in today. In the emergency room she had an EKG which showed SVT at 146 at time she go up into the 150s. She denied any chest pain or tightness only the shortness of breath. Adenosine was given dropped her heart rate into the 30s for brief period of time and showed third-degree heart block. This resolved rapidly and patient ended up with resumption of the SVT.    Past Medical History  Diagnosis Date  . Hyperlipidemia   . Migraines   . Hypertension   . Allergy   . Personal history of colonic polyps   . Cancer ([place])     basal cell  . Congestive heart failure [facility])     Patient Active Problem List   Diagnosis Date Noted  . DOE (dyspnea on exertion) 08/10/2015  . Chest pressure 08/10/2015  . Pain of right thumb 08/10/2015  . URI (upper respiratory infection) 06/27/2015  . Pelvic fullness 06/27/2015  . Health care maintenance 04/13/2015  . Abnormal mammogram 10/11/2014  . Ankle swelling 07/06/2014  . Decreased urination 07/06/2014  . Dizziness 07/06/2014  . Back pain 06/14/2014  . Right knee pain 04/30/2014  . Neck pain 09/21/2013  . UTI (urinary tract infection) 08/22/2013  . History of migraine headaches 11/11/2012  . Hypercholesterolemia 11/11/2012  . Allergic rhinitis 11/11/2012  . History of colonic polyps 11/11/2012    Past Surgical History  Procedure Laterality Date  . Tubal ligation    . Finger surgery    . Cholecystectomy   2006  . Vaginal hysterectomy      midurethral sling  . Hysteroscopy  2011, 10/2010    Current Outpatient Rx  Name  Route  Sig  Dispense  Refill  . calcium carbonate (OS-CAL) 600 MG TABS   Oral   Take 600 mg by mouth at bedtime.          . cholecalciferol (VITAMIN D) 1000 UNITS tablet   Oral   Take 1,000 Units by mouth at bedtime.          Marland Kitchen estradiol (ESTRACE) 0.1 MG/GM vaginal cream   Vaginal   Place 1 Applicatorful vaginally 2 (two) times a week.         . fexofenadine (ALLEGRA) 180 MG tablet   Oral   Take 180 mg by mouth at bedtime.          . Multiple Vitamin (MULTIVITAMIN WITH MINERALS) TABS tablet   Oral   Take 1 tablet by mouth at bedtime.         . simvastatin (ZOCOR) 10 MG tablet   Oral   Take 10 mg by mouth at bedtime.         . SUMAtriptan (IMITREX) 100 MG tablet   Oral   Take 100 mg by mouth every 2 (two) hours as needed for migraine. May repeat in 2 hours if headache persists or recurs.         Marland Kitchen zonisamide (ZONEGRAN) 100 MG capsule   Oral   Take  1 capsule (100 mg total) by mouth at bedtime.   30 capsule   1     PLEASE REFILL ZONISAMIDE     Allergies Review of patient's allergies indicates no known allergies.  Family History  Problem Relation Age of Onset  . Hypertension Mother   . Stroke Mother   . Hypercholesterolemia Father   . Diabetes Mother   . Heart disease Father     MI  . Hypercholesterolemia Father   . Hypertension Father   . Stroke Father   . Breast cancer Neg Hx   . Colon cancer Neg Hx     Social History Social History  Substance Use Topics  . Smoking status: Never Smoker   . Smokeless tobacco: Never Used  . Alcohol Use: 0.0 oz/week    0 Standard drinks or equivalent per week     Comment: Socially    Review of Systems Constitutional: No fever/chills Eyes: No visual changes. ENT: No sore throat. Cardiovascular: Denies chest pain. Respiratory:  C history of present illness stinal: No abdominal pain.   No nausea, no vomiting.  No diarrhea.  No constipation. Genitourinary: Negative for dysuria. Musculoskeletal: Negative for back pain. Skin: Negative for rash. Neurological: Negative for headaches, focal weakness or numbness.  10-point ROS otherwise negative.  ____________________________________________   PHYSICAL EXAM:  VITAL SIGNS: ED Triage Vitals  Enc Vitals Group     BP 08/31/15 2012 116/63 mmHg     Pulse Rate 08/31/15 2012 149     Resp 08/31/15 2012 22     Temp 08/31/15 2012 98.3 F (36.8 C)     Temp Source 08/31/15 2012 Oral     SpO2 08/31/15 2012 99 %     Weight 08/31/15 2012 167 lb (75.751 kg)     Height 08/31/15 2012 5\' 1"  (1.549 m)     Head Cir --      Peak Flow --      Pain Score 08/31/15 2016 0     Pain Loc --      Pain Edu? --      Excl. in [place]? --     Constitutional: Alert and oriented. Well appearing and in no acute distress. Eyes: Conjunctivae are normal. PERRL. EOMI. Head: Atraumatic. Nose: No congestion/rhinnorhea. Mouth/Throat: Mucous membranes are moist.  Oropharynx non-erythematous. Neck: No stridor.  Cardiovascular: Normal rate, regular rhythm. Grossly normal heart sounds.  Good peripheral circulation. Respiratory: Normal respiratory effort.  No retractions. Lungs CTAB. Gastrointestinal: Soft and nontender. No distention. No abdominal bruits. No CVA tenderness. Musculoskeletal: No lower extremity tenderness nor edema.  No joint effusions. Neurologic:  Normal speech and language. No gross focal neurologic deficits are appreciated. No gait instability. Skin:  Skin is warm, dry and intact. No rash noted. Psychiatric: Mood and affect are normal. Speech and behavior are normal.  ____________________________________________   LABS (all labs ordered are listed, but only abnormal results are displayed)  Labs Reviewed  BRAIN NATRIURETIC PEPTIDE - Abnormal; Notable for the following:    B Natriuretic Peptide 853.0 (*)    All other components within  normal limits  COMPREHENSIVE METABOLIC PANEL - Abnormal; Notable for the following:    Glucose, Bld 117 (*)    BUN 22 (*)    All other components within normal limits  TROPONIN I - Abnormal; Notable for the following:    Troponin I 0.05 (*)    All other components within normal limits  CBC - Abnormal; Notable for the following:    WBC 13.7 (*)  All other components within normal limits  LACTIC ACID, PLASMA  LACTIC ACID, PLASMA  TSH   ____________________________________________  [facility] #1 read and interpreted by me shows SVT at a rate of 146 no acute ST-T wave changes EKG #2 read and interpreted by me shows third-degree heart block with a rate of 38.no acute ST-T wave changes EKG #3 read and interpreted by me shows resumption of the SVT at a rate of 152 again with no acute ST-T wave changes. ______  RADIOLOGYAsked x-ray looked to me like congestive heart failure radiologist reads it and agrees.______   PROCEDURES  Critical care: 15 minutes  ____________________________________________   INITIAL IMPRESSION / ASSESSMENT AND PLAN / ED COURSE  Pertinent labs & imaging results that were available during my care of the patient were reviewed by me and considered in my medical decision making (see chart for details).   Because of the third-degree heart block when the adenosine was given I will not give any further diltiazem although I will initiate a drip if need be to keep her heart rate below 140.  ____________________________________________   FINAL CLINICAL IMPRESSION(S) / ED DIAGNOSES  Final diagnoses:  Atrial fibrillation with rapid ventricular response (HCC)  Acute on chronic congestive heart failure, unspecified congestive heart failure type ([place])  Elevated troponin      Nena Polio, MD 08/31/15 2212

## 2015-08-31 NOTE — ED Notes (Signed)
Pt arrived to the ED accompanied by her daughter for complaints of SOB and cough. Pt states that 3 months was diagnosed with pneumonia and has been experiencing symptoms ever since. Pt reports that she was recently diagnosed with congestive heart failure but does not take any medication for it. Pt is short of breat and tachycardic during triage wit 100% O2 saturation.

## 2015-08-31 NOTE — ED Notes (Addendum)
Pt states that she has experienced SOB intermittently since October. Pt unable to sleep last night without sitting up.SOB with exertion. Pt denies pain. Pt alert and oriented X4, active, cooperative, pt in NAD. RR even and unlabored, color WNL.  Pt HR 140's, pt denies feeling any palpitations at this time.

## 2015-08-31 NOTE — Telephone Encounter (Signed)
Please call pt and let her know that if she is having these issues, she needs to be evaluated.  If acute sob, etc - would recommend evaluation today - [facility] or [facility] and then we can f/u here.  I can work her in tomorrow at 1:00.    Dr Nicki Reaper

## 2015-09-01 ENCOUNTER — Ambulatory Visit: Payer: Managed Care, Other (non HMO) | Admitting: Internal Medicine

## 2015-09-01 ENCOUNTER — Encounter: Payer: Self-pay | Admitting: Internal Medicine

## 2015-09-01 ENCOUNTER — Inpatient Hospital Stay
Admit: 2015-09-01 | Discharge: 2015-09-01 | Disposition: A | Discharge status: Home / Self Care | Payer: Managed Care, Other (non HMO) | Attending: Internal Medicine | Admitting: Internal Medicine

## 2015-09-01 DIAGNOSIS — I248 Other forms of acute ischemic heart disease: Secondary | ICD-10-CM

## 2015-09-01 DIAGNOSIS — I4891 Unspecified atrial fibrillation: Secondary | ICD-10-CM | POA: Diagnosis present

## 2015-09-01 DIAGNOSIS — E785 Hyperlipidemia, unspecified: Secondary | ICD-10-CM | POA: Diagnosis present

## 2015-09-01 DIAGNOSIS — I442 Atrioventricular block, complete: Secondary | ICD-10-CM | POA: Diagnosis present

## 2015-09-01 DIAGNOSIS — I471 Supraventricular tachycardia, unspecified: Secondary | ICD-10-CM | POA: Diagnosis present

## 2015-09-01 DIAGNOSIS — I5021 Acute systolic (congestive) heart failure: Secondary | ICD-10-CM | POA: Diagnosis present

## 2015-09-01 DIAGNOSIS — G43909 Migraine, unspecified, not intractable, without status migrainosus: Secondary | ICD-10-CM | POA: Diagnosis present

## 2015-09-01 DIAGNOSIS — N3281 Overactive bladder: Secondary | ICD-10-CM | POA: Diagnosis present

## 2015-09-01 DIAGNOSIS — E0590 Thyrotoxicosis, unspecified without thyrotoxic crisis or storm: Secondary | ICD-10-CM | POA: Diagnosis present

## 2015-09-01 DIAGNOSIS — I110 Hypertensive heart disease with heart failure: Secondary | ICD-10-CM | POA: Diagnosis present

## 2015-09-01 DIAGNOSIS — I959 Hypotension, unspecified: Secondary | ICD-10-CM | POA: Diagnosis present

## 2015-09-01 DIAGNOSIS — I509 Heart failure, unspecified: Secondary | ICD-10-CM

## 2015-09-01 DIAGNOSIS — E039 Hypothyroidism, unspecified: Secondary | ICD-10-CM | POA: Diagnosis present

## 2015-09-01 DIAGNOSIS — R0602 Shortness of breath: Secondary | ICD-10-CM | POA: Diagnosis present

## 2015-09-01 DIAGNOSIS — Z79899 Other long term (current) drug therapy: Secondary | ICD-10-CM | POA: Diagnosis not present

## 2015-09-01 LAB — BASIC METABOLIC PANEL
Anion gap: 6 (ref 5–15)
BUN: 19 mg/dL (ref 6–20)
CALCIUM: 9.1 mg/dL (ref 8.9–10.3)
CO2: 25 mmol/L (ref 22–32)
CREATININE: 0.92 mg/dL (ref 0.44–1.00)
Chloride: 110 mmol/L (ref 101–111)
GFR calc Af Amer: >60 mL/min (ref >60)
GLUCOSE: 117 mg/dL — AB (ref 65–99)
Potassium: 4.3 mmol/L (ref 3.5–5.1)
Sodium: 141 mmol/L (ref 135–145)

## 2015-09-01 LAB — CBC
HCT: 38.2 % (ref 35.0–47.0)
HCT: 39.9 % (ref 35.0–47.0)
HEMOGLOBIN: 12.8 g/dL (ref 12.0–16.0)
Hemoglobin: 12.3 g/dL (ref 12.0–16.0)
MCH: 27.7 pg (ref 26.0–34.0)
MCH: 27.7 pg (ref 26.0–34.0)
MCHC: 32.1 g/dL (ref 32.0–36.0)
MCHC: 32.1 g/dL (ref 32.0–36.0)
MCV: 86.2 fL (ref 80.0–100.0)
MCV: 86.3 fL (ref 80.0–100.0)
PLATELETS: 276 10*3/uL (ref 150–440)
PLATELETS: 277 10*3/uL (ref 150–440)
RBC: 4.43 MIL/uL (ref 3.80–5.20)
RBC: 4.62 MIL/uL (ref 3.80–5.20)
RDW: 13.8 % (ref 11.5–14.5)
RDW: 13.8 % (ref 11.5–14.5)
WBC: 12.4 10*3/uL — AB (ref 3.6–11.0)
WBC: 11.2 10*3/uL — ABNORMAL HIGH (ref 3.6–11.0)

## 2015-09-01 LAB — TROPONIN I
TROPONIN I: 0.05 ng/mL — AB (ref <0.031)
Troponin I: 0.04 ng/mL — ABNORMAL HIGH (ref <0.031)
Troponin I: 0.03 ng/mL (ref <0.031)

## 2015-09-01 LAB — TSH: TSH: 0.18 u[IU]/mL — AB (ref 0.350–4.500)

## 2015-09-01 LAB — CREATININE, SERUM: CREATININE: 0.76 mg/dL (ref 0.44–1.00)

## 2015-09-01 LAB — LACTIC ACID, PLASMA: LACTIC ACID, VENOUS: 0.9 mmol/L (ref 0.5–2.0)

## 2015-09-01 MED ORDER — ONDANSETRON HCL 4 MG PO TABS: 4.0000 mg | ORAL_TABLET | Freq: Four times a day (QID) | ORAL | Status: DC | PRN

## 2015-09-01 MED ORDER — DILTIAZEM HCL 100 MG IV SOLR
5.0000 mg/h | INTRAVENOUS | Status: DC
  Administered 2015-09-01: 15 mg/h via INTRAVENOUS
  Filled 2015-09-01: qty 100

## 2015-09-01 MED ORDER — GUAIFENESIN-CODEINE 100-10 MG/5ML PO SOLN
10.0000 mL | ORAL | Status: DC | PRN
  Administered 2015-09-01 – 2015-09-02 (×5): 10 mL via ORAL
  Filled 2015-09-01 (×5): qty 10

## 2015-09-01 MED ORDER — ENOXAPARIN SODIUM 40 MG/0.4ML ~~LOC~~ SOLN
40.0000 mg | SUBCUTANEOUS | Status: DC
  Administered 2015-09-01 – 2015-09-02 (×2): 40 mg via SUBCUTANEOUS
  Filled 2015-09-01 (×2): qty 0.4

## 2015-09-01 MED ORDER — LORATADINE 10 MG PO TABS
10.0000 mg | ORAL_TABLET | Freq: Every day | ORAL | Status: DC
  Administered 2015-09-01 – 2015-09-03 (×3): 10 mg via ORAL
  Filled 2015-09-01 (×3): qty 1

## 2015-09-01 MED ORDER — MORPHINE SULFATE (PF) 2 MG/ML IV SOLN
2.0000 mg | INTRAVENOUS | Status: DC | PRN
  Administered 2015-09-01: 2 mg via INTRAVENOUS
  Filled 2015-09-01: qty 1

## 2015-09-01 MED ORDER — ONDANSETRON HCL 4 MG/2ML IJ SOLN
4.0000 mg | Freq: Four times a day (QID) | INTRAMUSCULAR | Status: DC | PRN
  Administered 2015-09-01 (×2): 4 mg via INTRAVENOUS
  Filled 2015-09-01 (×2): qty 2

## 2015-09-01 MED ORDER — OXYCODONE HCL 5 MG PO TABS
5.0000 mg | ORAL_TABLET | ORAL | Status: DC | PRN
  Administered 2015-09-01: 5 mg via ORAL
  Filled 2015-09-01: qty 1

## 2015-09-01 MED ORDER — SIMVASTATIN 10 MG PO TABS
10.0000 mg | ORAL_TABLET | Freq: Every day | ORAL | Status: DC
  Administered 2015-09-01 – 2015-09-02 (×2): 10 mg via ORAL
  Filled 2015-09-01 (×2): qty 1

## 2015-09-01 MED ORDER — ACETAMINOPHEN 325 MG PO TABS: 650.0000 mg | ORAL_TABLET | Freq: Four times a day (QID) | ORAL | Status: DC | PRN

## 2015-09-01 MED ORDER — SODIUM CHLORIDE 0.9 % IJ SOLN: 3.0000 mL | INTRAMUSCULAR | Status: DC | PRN

## 2015-09-01 MED ORDER — ONDANSETRON HCL 4 MG/2ML IJ SOLN: 4.0000 mg | INTRAMUSCULAR | Status: DC | PRN

## 2015-09-01 MED ORDER — ZONISAMIDE 100 MG PO CAPS
100.0000 mg | ORAL_CAPSULE | Freq: Every day | ORAL | Status: DC
  Administered 2015-09-01 – 2015-09-02 (×2): 100 mg via ORAL
  Filled 2015-09-01 (×2): qty 1

## 2015-09-01 MED ORDER — VITAMIN D 1000 UNITS PO TABS
1000.0000 [IU] | ORAL_TABLET | Freq: Every day | ORAL | Status: DC
  Administered 2015-09-01 – 2015-09-02 (×2): 1000 [IU] via ORAL
  Filled 2015-09-01 (×2): qty 1

## 2015-09-01 MED ORDER — ACETAMINOPHEN 650 MG RE SUPP
650.0000 mg | Freq: Four times a day (QID) | RECTAL | Status: DC | PRN
Start: 2015-09-01 — End: 2015-09-03

## 2015-09-01 MED ORDER — FUROSEMIDE 10 MG/ML IJ SOLN
40.0000 mg | Freq: Once | INTRAMUSCULAR | Status: AC
  Administered 2015-09-01: 40 mg via INTRAVENOUS
  Filled 2015-09-01: qty 4

## 2015-09-01 MED ORDER — DILTIAZEM HCL 30 MG PO TABS
30.0000 mg | ORAL_TABLET | Freq: Four times a day (QID) | ORAL | Status: DC
  Administered 2015-09-01: 30 mg via ORAL
  Filled 2015-09-01: qty 1

## 2015-09-01 MED ORDER — ADULT MULTIVITAMIN W/MINERALS CH
1.0000 | ORAL_TABLET | Freq: Every day | ORAL | Status: DC
  Administered 2015-09-01 – 2015-09-02 (×2): 1 via ORAL
  Filled 2015-09-01 (×2): qty 1

## 2015-09-01 MED ORDER — ASPIRIN EC 81 MG PO TBEC
81.0000 mg | DELAYED_RELEASE_TABLET | Freq: Every day | ORAL | Status: DC
  Administered 2015-09-01 – 2015-09-03 (×3): 81 mg via ORAL
  Filled 2015-09-01 (×3): qty 1

## 2015-09-01 MED ORDER — SODIUM CHLORIDE 0.9 % IV BOLUS (SEPSIS)
500.0000 mL | Freq: Once | INTRAVENOUS | Status: AC
  Administered 2015-09-01: 500 mL via INTRAVENOUS

## 2015-09-01 MED ORDER — HEPARIN SODIUM (PORCINE) 5000 UNIT/ML IJ SOLN
5000.0000 [IU] | Freq: Three times a day (TID) | INTRAMUSCULAR | Status: DC
  Administered 2015-09-01 (×2): 5000 [IU] via SUBCUTANEOUS
  Filled 2015-09-01 (×2): qty 1

## 2015-09-01 MED ORDER — CARVEDILOL 6.25 MG PO TABS: 6.2500 mg | ORAL_TABLET | Freq: Two times a day (BID) | ORAL | Status: DC

## 2015-09-01 MED ORDER — SODIUM CHLORIDE 0.9 % IJ SOLN: 3.0000 mL | Freq: Two times a day (BID) | INTRAMUSCULAR | Status: DC

## 2015-09-01 MED ORDER — CALCIUM CARBONATE ANTACID 500 MG PO CHEW
1500.0000 mg | CHEWABLE_TABLET | Freq: Every day | ORAL | Status: DC
  Administered 2015-09-01 – 2015-09-02 (×2): 1500 mg via ORAL
  Filled 2015-09-01 (×2): qty 3

## 2015-09-01 MED ORDER — CARVEDILOL 3.125 MG PO TABS
3.1250 mg | ORAL_TABLET | Freq: Two times a day (BID) | ORAL | Status: DC
  Administered 2015-09-02 (×2): 3.125 mg via ORAL
  Filled 2015-09-01 (×2): qty 1

## 2015-09-01 MED ORDER — FUROSEMIDE 10 MG/ML IJ SOLN
20.0000 mg | Freq: Two times a day (BID) | INTRAMUSCULAR | Status: DC
Start: 2015-09-01 — End: 2015-09-01
  Administered 2015-09-01: 20 mg via INTRAVENOUS
  Filled 2015-09-01: qty 2

## 2015-09-01 MED ORDER — LEVOTHYROXINE SODIUM 50 MCG PO TABS
50.0000 ug | ORAL_TABLET | Freq: Every day | ORAL | Status: DC
  Administered 2015-09-01 – 2015-09-02 (×2): 50 ug via ORAL
  Filled 2015-09-01 (×2): qty 1

## 2015-09-01 NOTE — H&P (Signed)
Kristi Greer    MR#:  [id number]  DATE OF BIRTH:  [birth year]   DATE OF ADMISSION:  08/31/2015  PRIMARY CARE PHYSICIAN: Einar Pheasant, MD   REQUESTING/REFERRING PHYSICIAN: Malinda  CHIEF COMPLAINT:   Chief Complaint  Patient presents with  . Shortness of Breath    HISTORY OF PRESENT ILLNESS:  Kristi Greer  is a 65 y.o. female with a known history of essential hypertension who is presenting with shortness of breath. She was informally diagnosed with congestive heart failure based off of a recent chest x-ray however has not yet undergone any further workup. Regardless, she is presenting with one-day duration of shortness of breath, dyspnea on exertion, lower extremity edema, orthopnea. Her edema and orthopnea have been progressively worsening for the past week she has had a nonproductive cough for almost 2 month duration no fevers chills further symptomatology. On arrival to the emergency department she was noted to be in supraventricular tachycardia received adenosine which only transiently worked she was then placed on a Cardizem drip by the emergency department staff with improvement in heart rate.  PAST MEDICAL HISTORY:   Past Medical History  Diagnosis Date  . Hyperlipidemia   . Migraines   . Hypertension   . Allergy   . Personal history of colonic polyps   . Cancer ([place])     basal cell  . Congestive heart failure ([place])     PAST SURGICAL HISTORY:   Past Surgical History  Procedure Laterality Date  . Tubal ligation    . Finger surgery    . Cholecystectomy  2006  . Vaginal hysterectomy      midurethral sling  . Hysteroscopy  2011, 10/2010    SOCIAL HISTORY:   Social History  Substance Use Topics  . Smoking status: Never Smoker   . Smokeless tobacco: Never Used  . Alcohol Use: 0.0 oz/week    0 Standard drinks or equivalent per week     Comment: Socially    FAMILY  HISTORY:   Family History  Problem Relation Age of Onset  . Hypertension Mother   . Stroke Mother   . Hypercholesterolemia Father   . Diabetes Mother   . Heart disease Father     MI  . Hypercholesterolemia Father   . Hypertension Father   . Stroke Father   . Breast cancer Neg Hx   . Colon cancer Neg Hx     DRUG ALLERGIES:  No Known Allergies  REVIEW OF SYSTEMS:  REVIEW OF SYSTEMS:  CONSTITUTIONAL: Denies fevers, chills, fatigue, weakness.  EYES: Denies blurred vision, double vision, or eye pain.  EARS, NOSE, THROAT: Denies tinnitus, ear pain, hearing loss.  RESPIRATORY: Positive cough, shortness of breath, denies wheezing  CARDIOVASCULAR: Denies chest pain, palpitations, positive edema.  GASTROINTESTINAL: Denies nausea, vomiting, diarrhea, abdominal pain.  GENITOURINARY: Denies dysuria, hematuria.  ENDOCRINE: Denies nocturia or thyroid problems. HEMATOLOGIC AND LYMPHATIC: Denies easy bruising or bleeding.  SKIN: Denies rash or lesions.  MUSCULOSKELETAL: Denies pain in neck, back, shoulder, knees, hips, or further arthritic symptoms.  NEUROLOGIC: Denies paralysis, paresthesias.  PSYCHIATRIC: Denies anxiety or depressive symptoms. Otherwise full review of systems performed by me is negative.   MEDICATIONS AT HOME:   Prior to Admission medications   Medication Sig Start Date End Date Taking? Authorizing Provider  calcium carbonate (OS-CAL) 600 MG TABS Take 600 mg by mouth at bedtime.    Yes Historical Provider, MD  cholecalciferol (VITAMIN D) 1000 UNITS tablet Take 1,000 Units by mouth at bedtime.    Yes Historical Provider, MD  estradiol (ESTRACE) 0.1 MG/GM vaginal cream Place 1 Applicatorful vaginally 2 (two) times a week.   Yes Historical Provider, MD  fexofenadine (ALLEGRA) 180 MG tablet Take 180 mg by mouth at bedtime.    Yes Historical Provider, MD  Multiple Vitamin (MULTIVITAMIN WITH MINERALS) TABS tablet Take 1 tablet by mouth at bedtime.   Yes Historical Provider,  MD  simvastatin (ZOCOR) 10 MG tablet Take 10 mg by mouth at bedtime.   Yes Historical Provider, MD  SUMAtriptan (IMITREX) 100 MG tablet Take 100 mg by mouth every 2 (two) hours as needed for migraine. May repeat in 2 hours if headache persists or recurs.   Yes Historical Provider, MD  zonisamide (ZONEGRAN) 100 MG capsule Take 1 capsule (100 mg total) by mouth at bedtime. 12/02/14  Yes Einar Pheasant, MD      VITAL SIGNS:  Blood pressure 119/79, pulse 68, temperature 98.3 F (36.8 C), temperature source Oral, resp. rate 21, height 5\' 1"  (1.549 m), weight 167 lb (75.751 kg), last menstrual period 10/18/2012, SpO2 96 %.  PHYSICAL EXAMINATION:  VITAL SIGNS: Filed Vitals:   08/31/15 2230 08/31/15 2330  BP: 119/93 119/79  Pulse: 133 68  Temp:    Resp: 30 21   GENERAL:65 y.o.female currently in no acute distress.  HEAD: Normocephalic, atraumatic.  EYES: Pupils equal, round, reactive to light. Extraocular muscles intact. No scleral icterus.  MOUTH: Moist mucosal membrane. Dentition intact. No abscess noted.  EAR, NOSE, THROAT: Clear without exudates. No external lesions.  NECK: Supple. No thyromegaly. No nodules. No JVD.  PULMONARY: Bibasilar crackles. No use of accessory muscles, Good respiratory effort. good air entry bilaterally CHEST: Nontender to palpation.  CARDIOVASCULAR: S1 and S2. Tachycardic No murmurs, rubs, or gallops. 1+ edema. Pedal pulses 2+ bilaterally.  GASTROINTESTINAL: Soft, nontender, nondistended. No masses. Positive bowel sounds. No hepatosplenomegaly.  MUSCULOSKELETAL: No swelling, clubbing, or edema. Range of motion full in all extremities.  NEUROLOGIC: Cranial nerves II through XII are intact. No gross focal neurological deficits. Sensation intact. Reflexes intact.  SKIN: No ulceration, lesions, rashes, or cyanosis. Skin warm and dry. Turgor intact.  PSYCHIATRIC: Mood, affect within normal limits. The patient is awake, alert and oriented x 3. Insight, judgment intact.     LABORATORY PANEL:   CBC  Recent Labs Lab 08/31/15 2022  WBC 13.7*  HGB 12.9  HCT 39.6  PLT 317   ------------------------------------------------------------------------------------------------------------------  Chemistries   Recent Labs Lab 08/31/15 2022  NA 139  K 4.2  CL 107  CO2 24  GLUCOSE 117*  BUN 22*  CREATININE 0.85  CALCIUM 9.2  AST 24  ALT 19  ALKPHOS 90  BILITOT 0.5   ------------------------------------------------------------------------------------------------------------------  Cardiac Enzymes  Recent Labs Lab 08/31/15 2022  TROPONINI 0.05*   ------------------------------------------------------------------------------------------------------------------  RADIOLOGY:  Dg Chest 2 View  08/31/2015  CLINICAL DATA:  65 year old female with a history of shortness of breath and cough. EXAM: CHEST - 2 VIEW COMPARISON:  07/23/2015, 06/25/2015 FINDINGS: Cardiomediastinal silhouette unchanged, upper limits normal. Indistinct margins of the vasculature centrally. Interlobular septal thickening bilaterally with mixed interstitial and airspace disease, radiating pattern. No pneumothorax. Blunting of the costophrenic sulcus on the lateral view. Unremarkable appearance of the upper abdomen. IMPRESSION: Evidence of congestive heart failure and developing edema. Atypical infection could have this appearance though is favored to be less likely given the imaging history. Signed, Dulcy Fanny. Earleen Newport, DO  [facility] Electronically Signed   By: Corrie Mckusick D.O.   On: 08/31/2015 21:05    EKG:   Orders placed or performed during the hospital encounter of 08/31/15  . ED EKG  . ED EKG  . ED EKG  . Repeat EKG  . ED EKG  . Repeat EKG  . EKG 12-Lead  . EKG 12-Lead  . EKG 12-Lead  . EKG 12-Lead    IMPRESSION AND PLAN:   65 year old Caucasian female history of essential hypertension presenting with  shortness of breath  1. Congestive heart failure acute, unspecified: Telemetry, echocardiogram, aspirin, DuoNeb's, diuresis, cardiology consult 2. Elevated troponin: Trend cardiac enzymes 3 3. SVT: Currently rate controlled on Cardizem drip will continue this for now 4. Hyperlipidemia unspecified statin therapy 4. Venous embolism prophylactic: Heparin subcutaneous    All the records are reviewed and case discussed with ED provider. Management plans discussed with the patient, family and they are in agreement.  CODE STATUS: Full  TOTAL TIME TAKING CARE OF THIS PATIENT: 35 minutes.    Dajanay Northrup,  Karenann Cai.D on 09/01/2015 at 12:52 AM  Between 7am to 6pm - Pager - [phone]  After 6pm: House Pager: - [facility]  Office  [phone]  CC: Primary care physician; Einar Pheasant, MD

## 2015-09-01 NOTE — Progress Notes (Signed)
Cardizem gtt discontinued per MD orders.   Will continue to monitor pt.  No further complaints of nausea.

## 2015-09-01 NOTE — Discharge Instructions (Signed)
[facility] appointment on September 22, 2015 at 11:00am with Kristi Greer, [name]. Please call [phone] to reschedule.

## 2015-09-01 NOTE — Progress Notes (Signed)
Cardizem gtt titrated down to 10mg /hr, verified with Thomas Hoff, RN.  Will continue to monitor.

## 2015-09-01 NOTE — Progress Notes (Signed)
Dr. Ubaldo Glassing made aware of pt's BP, will hold Coreg.

## 2015-09-01 NOTE — ED Notes (Signed)
Per Dr. Lavetta Nielsen, pt okay to eat. Family provided pt with grilled chicken sandwich.

## 2015-09-01 NOTE — Progress Notes (Signed)
Initial appointment scheduled at the [facility] Failure Clinic on September 22, 2015 at 11:00am. Thank you.

## 2015-09-01 NOTE — Progress Notes (Signed)
*  PRELIMINARY RESULTS* Echocardiogram 2D Echocardiogram has been performed.  Kristi Greer 09/01/2015, 10:23 AM

## 2015-09-01 NOTE — Progress Notes (Signed)
Complaining of nausea after eating breakfast.  Zofran 4mg  iv given, will monitor.

## 2015-09-01 NOTE — Progress Notes (Signed)
Diltiazem 30 mg po given per Md orders, will stop Cardizem gtt in 1 hour

## 2015-09-01 NOTE — Consult Note (Addendum)
[facility]  CARDIOLOGY CONSULT NOTE  [name] MRN: [id number] DOB/AGE: 03/15/1951 65 y.o.  Admit date: 08/31/2015 Referring Physician Dr. Verdell Carmine Primary Physician Dr. Nicki Reaper [facility]) Primary Cardiologist Dr. Saralyn Pilar Reason for Consultation chf  HPI:  Pt is a 65 yo female with admission due to progressive sob. She began noting increasing sob over the past several months who was noted to have cardiomegaly on cxr and was seen by Dr. Saralyn Pilar and was scheduled for an echo. She had complained of increased sob, orthopnea and pnd over the past several days to the point where she was unable to lay flat in bed. When ambulating she had extreme sob and fatigue. In the er her cxr revealed chf. EKG revealed svt initially at rates of 150. She was given iv adenocard with conversion to nsr followed by recurrent svt. She was given a cardizem bolus of 5 mg and placed on a cardizem drip.  Currently her heart rate is 70-80 and is nsr. She has been given iv furosemide with diuresis and improvement. She denies chest pain. Serum troponin is mildly elevated at 0.05.   ROS Review of Systems - General ROS: positive for  - fatigue and sob Respiratory ROS: positive for - shortness of breath Cardiovascular ROS: positive for - dyspnea on exertion and shortness of breath Gastrointestinal ROS: no abdominal pain, change in bowel habits, or black or bloody stools Musculoskeletal ROS: negative Neurological ROS: no TIA or stroke symptoms   Past Medical History  Diagnosis Date  . Hyperlipidemia   . Migraines   . Hypertension   . Allergy   . Personal history of colonic polyps   . Cancer ([place])     basal cell  . Congestive heart failure ([facility])     Family History  Problem Relation Age of Onset  . Hypertension Mother   . Stroke Mother   . Hypercholesterolemia Father   . Diabetes Mother   . Heart disease Father     MI  . Hypercholesterolemia Father    . Hypertension Father   . Stroke Father   . Breast cancer Neg Hx   . Colon cancer Neg Hx     Social History   Social History  . Marital Status: Married    Spouse Name: N/A  . Number of Children: 2  . Years of Education: N/A   Occupational History  . Not on file.   Social History Main Topics  . Smoking status: Never Smoker   . Smokeless tobacco: Never Used  . Alcohol Use: 0.0 oz/week    0 Standard drinks or equivalent per week     Comment: Socially  . Drug Use: No  . Sexual Activity: Not on file   Other Topics Concern  . Not on file   Social History Narrative    Past Surgical History  Procedure Laterality Date  . Tubal ligation    . Finger surgery    . Cholecystectomy  2006  . Vaginal hysterectomy      midurethral sling  . Hysteroscopy  2011, 10/2010     Prescriptions prior to admission  Medication Sig Dispense Refill Last Dose  . calcium carbonate (OS-CAL) 600 MG TABS Take 600 mg by mouth at bedtime.    08/30/2015 at Unknown time  . cholecalciferol (VITAMIN D) 1000 UNITS tablet Take 1,000 Units by mouth at bedtime.    08/30/2015 at Unknown time  . estradiol (ESTRACE) 0.1 MG/GM vaginal cream Place 1 Applicatorful  vaginally 2 (two) times a week.   unknown at unknown  . fexofenadine (ALLEGRA) 180 MG tablet Take 180 mg by mouth at bedtime.    08/30/2015 at Unknown time  . Multiple Vitamin (MULTIVITAMIN WITH MINERALS) TABS tablet Take 1 tablet by mouth at bedtime.   08/30/2015 at Unknown time  . simvastatin (ZOCOR) 10 MG tablet Take 10 mg by mouth at bedtime.   08/30/2015 at Unknown time  . SUMAtriptan (IMITREX) 100 MG tablet Take 100 mg by mouth every 2 (two) hours as needed for migraine. May repeat in 2 hours if headache persists or recurs.   PRN at PRN  . zonisamide (ZONEGRAN) 100 MG capsule Take 1 capsule (100 mg total) by mouth at bedtime. 30 capsule 1 08/30/2015 at Unknown time    Physical Exam: Blood pressure 91/59, pulse 111, temperature 97.8 F (36.6 C), temperature  source Oral, resp. rate 16, height 5\' 1"  (1.549 m), weight 74.345 kg (163 lb 14.4 oz), last menstrual period 10/18/2012, SpO2 94 %.    General appearance: alert and cooperative Resp: rales bibasilar Cardio: regular rate and rhythm GI: normal findings: no masses palpable and no organomegaly and abnormal findings:  mild tenderness Extremities: edema trace edema Pulses: 2+ and symmetric Neurologic: Grossly normal Labs:   Lab Results  Component Value Date   WBC 11.2* 09/01/2015   HGB 12.8 09/01/2015   HCT 39.9 09/01/2015   MCV 86.3 09/01/2015   PLT 277 09/01/2015    Recent Labs Lab 08/31/15 2022  09/01/15 0400  NA 139  --  141  K 4.2  --  4.3  CL 107  --  110  CO2 24  --  25  BUN 22*  --  19  CREATININE 0.85  < > 0.92  CALCIUM 9.2  --  9.1  PROT 7.6  --   --   BILITOT 0.5  --   --   ALKPHOS 90  --   --   ALT 19  --   --   AST 24  --   --   GLUCOSE 117*  --  117*  < > = values in this interval not displayed. Lab Results  Component Value Date   TROPONINI 0.05* 09/01/2015      Radiology: cardiomegaly and mild chf EKG: initially svt converted to nsr. No ischemia  ASSESSMENT AND PLAN:  65 yo with history of hypertension and recent diagnosis with cardiomegaly and evidence of volume overload on cxr who was admitted after presenting to the er with increasing sob and orthopnea. Noted to have evidence of mild pulmonary edema on cxr. Initial ekg showed svt at rates of 150 converted to nsr after try of adenocard and bolus with cardizem and placed on cardizem drip. She has a mild troponin elevation which appears to be secondary to demand ischemia and not nstemi acs based on current data. Will obtain echo to evaluate lv function and continue to control heart rate with cardizem. Will d/c iv cardizem and place on po cardizem at 30 q 6. Will continue to diurese. Further recs after echo Signed: Teodoro Spray MD, [facility] 09/01/2015, 8:41 AM    Addendum:  Echocardiogram showed moderate reduced  LV function with EF approximately 35-40%.  This is global hypokinesis.  This is likely the etiology of her heart failure.  Will attempt to continue to control heart rate  With Cardizem however will attempt to convert to carvedilol given reduced LV function.  Not a candidate for afterload reduction secondary to  relative hypotension.  Continue to diurese.  Plan would be to optimize medical therapy and continue to diurese to dry weight.  Will ambulate patient in 1 stable consider discharge to home with outpatient follow-up to consider invasive evaluation to evaluate etiology of cardiomyopathy.  This may be ischemic verses idiopathic versus viral cardiomyopathy.

## 2015-09-01 NOTE — Plan of Care (Signed)
Problem: Safety: Goal: Ability to remain free from injury will improve Outcome: Progressing Fall precautions in place  Problem: Pain Managment: Goal: General experience of comfort will improve Outcome: Progressing Prn meds  Problem: Tissue Perfusion: Goal: Risk factors for ineffective tissue perfusion will decrease Outcome: Progressing SQ heparin  Problem: Cardiac: Goal: Ability to achieve and maintain adequate cardiopulmonary perfusion will improve Outcome: Progressing On IV Diltiazem, will wean off today to PO

## 2015-09-01 NOTE — Progress Notes (Signed)
[name] NAME: Kristi Greer    MR#:  [id number]  DATE OF BIRTH:  08/26/1951  SUBJECTIVE:   Pt. Here due to shortness of breath and noted to be in CHF.  Also noted to be in a SVT.  Was on Cardizem gtt but now weaned off. Family at bedside.  Feels better.   REVIEW OF SYSTEMS:    Review of Systems  Constitutional: Negative for fever and chills.  HENT: Negative for congestion and tinnitus.   Eyes: Negative for blurred vision and double vision.  Respiratory: Negative for cough, shortness of breath and wheezing.   Cardiovascular: Negative for chest pain, orthopnea and PND.  Gastrointestinal: Negative for nausea, vomiting, abdominal pain and diarrhea.  Genitourinary: Negative for dysuria and hematuria.  Neurological: Negative for dizziness, sensory change and focal weakness.  All other systems reviewed and are negative.   Nutrition: Heart Healthy Tolerating Diet: Yes Tolerating PT:  Ambulatory.   DRUG ALLERGIES:  No Known Allergies  VITALS:  Blood pressure 92/60, pulse 101, temperature 98.2 F (36.8 C), temperature source Oral, resp. rate 17, height 5\' 1"  (1.549 m), weight 74.345 kg (163 lb 14.4 oz), last menstrual period 10/18/2012, SpO2 91 %.  PHYSICAL EXAMINATION:   Physical Exam  GENERAL:  65 y.o.-year-old patient lying in the bed with no acute distress.  EYES: Pupils equal, round, reactive to light and accommodation. No scleral icterus. Extraocular muscles intact.  HEENT: Head atraumatic, normocephalic. Oropharynx and nasopharynx clear.  NECK:  Supple, no jugular venous distention. No thyroid enlargement, no tenderness.  LUNGS: Normal breath sounds bilaterally, no wheezing, rales, rhonchi. No use of accessory muscles of respiration.  CARDIOVASCULAR: S1, S2 normal. No murmurs, rubs, or gallops.  ABDOMEN: Soft, nontender, nondistended. Bowel sounds present. No organomegaly or mass.  EXTREMITIES: No  cyanosis, clubbing or edema b/l.    NEUROLOGIC: Cranial nerves II through XII are intact. No focal Motor or sensory deficits b/l.   PSYCHIATRIC: The patient is alert and oriented x 3.  SKIN: No obvious rash, lesion, or ulcer.    LABORATORY PANEL:   CBC  Recent Labs Lab 09/01/15 0400  WBC 11.2*  HGB 12.8  HCT 39.9  PLT 277   ------------------------------------------------------------------------------------------------------------------  Chemistries   Recent Labs Lab 08/31/15 2022  09/01/15 0400  NA 139  --  141  K 4.2  --  4.3  CL 107  --  110  CO2 24  --  25  GLUCOSE 117*  --  117*  BUN 22*  --  19  CREATININE 0.85  < > 0.92  CALCIUM 9.2  --  9.1  AST 24  --   --   ALT 19  --   --   ALKPHOS 90  --   --   BILITOT 0.5  --   --   < > = values in this interval not displayed. ------------------------------------------------------------------------------------------------------------------  Cardiac Enzymes  Recent Labs Lab 09/01/15 1423  TROPONINI 0.03   ------------------------------------------------------------------------------------------------------------------  RADIOLOGY:  Dg Chest 2 View  08/31/2015  CLINICAL DATA:  65 year old female with a history of shortness of breath and cough. EXAM: CHEST - 2 VIEW COMPARISON:  07/23/2015, 06/25/2015 FINDINGS: Cardiomediastinal silhouette unchanged, upper limits normal. Indistinct margins of the vasculature centrally. Interlobular septal thickening bilaterally with mixed interstitial and airspace disease, radiating pattern. No pneumothorax. Blunting of the costophrenic sulcus on the lateral view. Unremarkable appearance of the upper abdomen. IMPRESSION: Evidence of congestive heart failure and  developing edema. Atypical infection could have this appearance though is favored to be less likely given the imaging history. Signed, Kristi Greer. Kristi Newport, DO [facility] Radiology  Electronically Signed   By: Kristi Greer D.O.   On: 08/31/2015 21:05     ASSESSMENT AND PLAN:   66 year old female with past medical history of migraines, hypertension, hyperlipidemia, history of overactive bladder, vaginal prolapse/cystocele who presented to the hospital due to shortness of breath and noted to be in congestive heart failure.  #1 CHF-acute systolic dysfunction. -Patient's echocardiogram showing ejection fraction of 35-45%. Questionable if this is a rate related versus underlying ischemia. -Continue diuresis with IV Lasix, follow I's and O's and daily weights. -Continue Coreg. Hold Ace given the relative hypotension.  #2 SVT-patient's heart rates are still labile. She is although not clinically symptomatic. -Continue Coreg for now. We will use as needed Cardizem for heart rates greater than 130s.  #3 hypothyroidism-continue Synthroid.  #4 hyperlipidemia-continue simvastatin.  #5 hx of Migraines - stable.     All the records are reviewed and case discussed with Care Management/Social Workerr. Management plans discussed with the patient, family and they are in agreement.  CODE STATUS: Full  DVT Prophylaxis: Lovenox  TOTAL TIME TAKING CARE OF THIS PATIENT: 30 minutes.   POSSIBLE D/C IN 1-2 DAYS, DEPENDING ON CLINICAL CONDITION.   Kristi Greer M.D on 09/01/2015 at 4:10 PM  Between 7am to 6pm - Pager - [phone]  After 6pm go to [email or web address] - password [email or web address] [facility]  Office  [phone]  CC: Primary care physician; Kristi Pheasant, MD

## 2015-09-01 NOTE — Progress Notes (Signed)
Initial Nutrition Assessment  DOCUMENTATION CODES:      INTERVENTION:  Meals and snacks: cater to pt preferences Nutrition diet education:  RD consulted for nutrition education regarding new onset CHF.  RD provided "Low Sodium Nutrition Therapy" handout from the Academy of [employer]. Reviewed patient's dietary recall. Provided examples on ways to decrease sodium intake in diet. Discouraged intake of processed foods and use of salt shaker. Encouraged fresh fruits and vegetables as well as whole grain sources of carbohydrates to maximize fiber intake.   RD discussed why it is important for patient to adhere to diet recommendations, and emphasized the role of fluids, foods to avoid, and importance of weighing self daily. Teach back method used.  Expect good compliance.     NUTRITION DIAGNOSIS:   Food and nutrition related knowledge deficit related to chronic illness as evidenced by  (unfamiliar with high sodium foods).    GOAL:   Patient will meet greater than or equal to 90% of their needs    MONITOR:    (Energy intake, Electrolyte and renal profile)  REASON FOR ASSESSMENT:   Diagnosis    ASSESSMENT:      Pt admitted with CHF  Past Medical History  Diagnosis Date  . Hyperlipidemia   . Migraines   . Hypertension   . Allergy   . Personal history of colonic polyps   . Cancer ([place])     basal cell  . Congestive heart failure ([place])     Current Nutrition: tolerating meals but does not really like hospital foods.  Family bringing in food.  Food/Nutrition-Related History: Pt reports decreased intake for the past week secondary to shortness of breath   Scheduled Medications:  . aspirin EC  81 mg Oral Daily  . calcium carbonate  1,500 mg Oral QHS  . carvedilol  3.125 mg Oral BID WC  . cholecalciferol  1,000 Units Oral QHS  . enoxaparin (LOVENOX) injection  40 mg Subcutaneous Q24H  . furosemide  20 mg Intravenous Q12H  . levothyroxine  50 mcg  Oral QAC breakfast  . loratadine  10 mg Oral Daily  . multivitamin with minerals  1 tablet Oral QHS  . simvastatin  10 mg Oral QHS  . zonisamide  100 mg Oral QHS       Electrolyte/Renal Profile and Glucose Profile:   Recent Labs Lab 08/31/15 2022 09/01/15 0117 09/01/15 0400  NA 139  --  141  K 4.2  --  4.3  CL 107  --  110  CO2 24  --  25  BUN 22*  --  19  CREATININE 0.85 0.76 0.92  CALCIUM 9.2  --  9.1  GLUCOSE 117*  --  117*   Protein Profile:  Recent Labs Lab 08/31/15 2022  ALBUMIN 4.0    Gastrointestinal Profile: Last BM:1/3   Nutrition-Focused Physical Exam Findings:  Unable to complete Nutrition-Focused physical exam at this time.     Weight Change: wt gain prior to admission    Diet Order:  Diet Heart Room service appropriate?: Yes; Fluid consistency:: Thin  Skin:   reviewed   Height:   Ht Readings from Last 1 Encounters:  08/31/15 5\' 1"  (1.549 m)    Weight:   Wt Readings from Last 1 Encounters:  09/01/15 163 lb 14.4 oz (74.345 kg)    Ideal Body Weight:     BMI:  Body mass index is 30.98 kg/(m^2).   EDUCATION NEEDS:   Education needs addressed  LOW Care Level  Arlett Goold B. Zenia Resides, [place], [place] (pager) Weekend/On-Call pager [phone])

## 2015-09-01 NOTE — Progress Notes (Signed)
500 ml NS bolus hung per Dr. Ubaldo Glassing orders.

## 2015-09-02 LAB — BASIC METABOLIC PANEL
Anion gap: 4 — ABNORMAL LOW (ref 5–15)
BUN: 21 mg/dL — AB (ref 6–20)
CO2: 27 mmol/L (ref 22–32)
Calcium: 8.7 mg/dL — ABNORMAL LOW (ref 8.9–10.3)
Chloride: 110 mmol/L (ref 101–111)
Creatinine, Ser: 0.96 mg/dL (ref 0.44–1.00)
GFR calc Af Amer: >60 mL/min (ref >60)
GFR calc non Af Amer: >60 mL/min (ref >60)
Glucose, Bld: 114 mg/dL — ABNORMAL HIGH (ref 65–99)
POTASSIUM: 3.9 mmol/L (ref 3.5–5.1)
SODIUM: 141 mmol/L (ref 135–145)

## 2015-09-02 MED ORDER — MAGNESIUM SULFATE 2 GM/50ML IV SOLN
2.0000 g | Freq: Once | INTRAVENOUS | Status: AC
  Administered 2015-09-02: 2 g via INTRAVENOUS
  Filled 2015-09-02: qty 50

## 2015-09-02 MED ORDER — METHIMAZOLE 5 MG PO TABS
5.0000 mg | ORAL_TABLET | Freq: Every day | ORAL | Status: DC
  Administered 2015-09-02 – 2015-09-03 (×2): 5 mg via ORAL
  Filled 2015-09-02 (×2): qty 1

## 2015-09-02 MED ORDER — DILTIAZEM HCL 30 MG PO TABS
30.0000 mg | ORAL_TABLET | Freq: Four times a day (QID) | ORAL | Status: DC
  Administered 2015-09-02 – 2015-09-03 (×3): 30 mg via ORAL
  Filled 2015-09-02 (×3): qty 1

## 2015-09-02 NOTE — Progress Notes (Signed)
MD notified of second- 6 beat run of vtach.  Dr. Estanislado Pandy notified- said he would relay to his team.  Will continue to monitor.  No new orders. Jessee Avers

## 2015-09-02 NOTE — Consult Note (Signed)
Endocrine Initial Consult Note Date of Consult: 09/02/2015  Consulting Service: [facility] Endocrinology  Service Requesting Consult: Dr. Verdell Carmine  SUBJECTIVE: Reason for Consultation: hyperthyroidism  History of Present Illness: Kristi Greer is a 65 y.o. female with PMH HTN who was admitted with shortness of breath. She was told that she had CHF based on a recent chest X-ray. She reported progressive dyspnea over 1 week and a cough that started in October 2016 and has persisted until now. In the ED, she was found to have SVT.   Endocrinology has been consulted due to concern for hyperthyroidism. TSH is lower than normal and free T4 and free T3 levels are in process. She reports being told that her thyroid function was abnormal about 2 years ago by her PCP Dr. Nicki Reaper. This was being monitored but she did not have further testing or intervention. She endorses heat intolerance, sweats, palpitations, and cough associated with shortness of breath.  The family history is only notable for a niece with thyroid disease.   Patient Active Problem List   Diagnosis Date Noted  . Congestive heart failure, acute ([place]) 09/01/2015  . SVT (supraventricular tachycardia) ([place]) 09/01/2015  . Demand ischemia ([place]) 09/01/2015  . Health care maintenance 04/13/2015  . Abnormal mammogram 10/11/2014  . History of migraine headaches 11/11/2012  . Hypercholesterolemia 11/11/2012  . History of colonic polyps 11/11/2012     Past Medical History  Diagnosis Date  . Hyperlipidemia   . Migraines   . Hypertension   . Allergy   . Personal history of colonic polyps   . Cancer ([place])     basal cell  . Congestive heart failure [facility])    Past Surgical History  Procedure Laterality Date  . Tubal ligation    . Finger surgery    . Cholecystectomy  2006  . Vaginal hysterectomy      midurethral sling  . Hysteroscopy  2011, 10/2010   Family History  Problem Relation Age of Onset  . Hypertension Mother    . Stroke Mother   . Hypercholesterolemia Father   . Diabetes Mother   . Heart disease Father     MI  . Hypercholesterolemia Father   . Hypertension Father   . Stroke Father   . Breast cancer Neg Hx   . Colon cancer Neg Hx     Social History:  Social History  Substance Use Topics  . Smoking status: Never Smoker   . Smokeless tobacco: Never Used  . Alcohol Use: 0.0 oz/week    0 Standard drinks or equivalent per week     Comment: Socially     No Known Allergies   Medications:  No current facility-administered medications on file prior to encounter.   Current Outpatient Prescriptions on File Prior to Encounter  Medication Sig Dispense Refill  . calcium carbonate (OS-CAL) 600 MG TABS Take 600 mg by mouth at bedtime.     . cholecalciferol (VITAMIN D) 1000 UNITS tablet Take 1,000 Units by mouth at bedtime.     . fexofenadine (ALLEGRA) 180 MG tablet Take 180 mg by mouth at bedtime.     Marland Kitchen zonisamide (ZONEGRAN) 100 MG capsule Take 1 capsule (100 mg total) by mouth at bedtime. 30 capsule 1    Review of Systems: Pertinent items are noted in HPI.  OBJECTIVE: Temp:  [97.7 F (36.5 C)-97.9 F (36.6 C)] 97.9 F (36.6 C) (01/05 0321) Pulse Rate:  [105-119] 119 (01/05 0738) Resp:  [16-18] 18 (01/05 0321) BP: (89-94)/(55-65) 93/65  mmHg (01/05 0738) SpO2:  [92 %-94 %] 92 % (01/05 0321) Weight:  [74.118 kg (163 lb 6.4 oz)] 74.118 kg (163 lb 6.4 oz) (01/05 0321)  Temp (24hrs), Avg:97.8 F (36.6 C), Min:97.7 F (36.5 C), Max:97.9 F (36.6 C)  Weight: 74.118 kg (163 lb 6.4 oz)  Physical Exam: Gen: no acute distress, well-nourished, well-appearing HEENT: [place]/AT, eyes anicteric, EOMI, mucous membranes moist, no oropharyngeal lesions Neck: no thyroid enlargement or nodules noted, no cervical lymphadenopathy CAD: tachycardic, regular rhythm. No murmur rubs or gallops PULM: clear to ausculation bilaterally, no wheezes, rhonchi or rales. GI: soft, non tender, non distended. EXT: no  clubbing, cyanosis or edema  Skin: warm, dry, no rash Neuro: grossly non focal,  alert and oriented x 3  BMP Latest Ref Rng 09/02/2015 09/01/2015 09/01/2015  Glucose 65 - 99 mg/dL 114(H) 117(H) -  BUN 6 - 20 mg/dL 21(H) 19 -  Creatinine 0.44 - 1.00 mg/dL 0.96 0.92 0.76  Sodium 135 - 145 mmol/L 141 141 -  Potassium 3.5 - 5.1 mmol/L 3.9 4.3 -  Chloride 101 - 111 mmol/L 110 110 -  CO2 22 - 32 mmol/L 27 25 -  Calcium 8.9 - 10.3 mg/dL 8.7(L) 9.1 -   CBC Latest Ref Rng 09/01/2015 09/01/2015 08/31/2015  WBC 3.6 - 11.0 K/uL 11.2(H) 12.4(H) 13.7(H)  Hemoglobin 12.0 - 16.0 g/dL 12.8 12.3 12.9  Hematocrit 35.0 - 47.0 % 39.9 38.2 39.6  Platelets 150 - 440 K/uL 277 276 317   Component     Latest Ref Rng 09/17/2013 10/20/2013 12/03/2013 04/24/2014  TSH     0.350 - 4.500 uIU/mL 0.14 (L) 0.19 (L) 0.02 (L) 0.17 (L)   Component     Latest Ref Rng 07/03/2014 07/07/2014 08/06/2014 04/05/2015  TSH     0.350 - 4.500 uIU/mL 0.069 (L) 0.06 (L) 0.09 (L) 0.12 (L)   Component     Latest Ref Rng 05/21/2015 09/01/2015  TSH     0.350 - 4.500 uIU/mL 0.11 (L) 0.180 (L)   Component     Latest Ref Rng 04/05/2015 05/21/2015  TSH     0.35 - 4.50 uIU/mL    T3, Free     2.3 - 4.2 pg/mL 3.1 3.7  Free T4     0.60 - 1.60 ng/dL 0.64 0.71    ASSESSMENT:  65 yo woman admitted with shortness of breath and SVT. She has had subclinical hyperthyroidism for some time but now she is experiencing symptoms and arrhythmia, warranting treatment.   RECOMMENDATIONS:   Free T4 and Free T3 in process. Start methimazole 5 mg once daily.  Continue beta blocker blood pressure-permitting Repeat TFT's in 4 weeks.  Follow up with me outpatient at that time.  Thank you for this consult.   Atha Starks, MD [facility]ocrinology

## 2015-09-02 NOTE — Progress Notes (Signed)
Patient sitting in bed resting. HR is sustaining at 130. Patient is asymptomatic. Notified [facility]. No new orders. Will continue to monitor patient. Horton Finer

## 2015-09-02 NOTE — Care Management (Signed)
At present, have not identified any discharge needs.  Has a relatively new diagnosis of congested heart failure but that does not seem to be the reason for admission.

## 2015-09-02 NOTE — Progress Notes (Signed)
Patient resting well in bed, alert and oriented with no complaints. Will continue to monitor patient. Kristi Greer

## 2015-09-02 NOTE — Progress Notes (Signed)
Pt states she does not wants to take Tapazone as order for this time. She wants to speak with the MD before starting taking this med. Dr is paged

## 2015-09-02 NOTE — Progress Notes (Signed)
ACE/ARB therapy was not prescribed due to relative hypotension

## 2015-09-02 NOTE — Progress Notes (Signed)
MD, Dr. Leslye Peer notified for 6 beats of vtach.  MD to place order for IV mag replacement.  Other electrolytes to be checked in am blood draw.  Will continue to monitor- strip placed in chart. Jessee Avers

## 2015-09-02 NOTE — Progress Notes (Signed)
[place] [facility] [place] NAME: Kristi Greer    MR#:  [id number]  DATE OF BIRTH:  1950/12/02  SUBJECTIVE:   Pt. Here due to shortness of breath and noted to be in CHF.  Shortness of breath much improved since admission. Still tachycardic but Sinus tachycardia.    REVIEW OF SYSTEMS:    Review of Systems  Constitutional: Negative for fever and chills.  HENT: Negative for congestion and tinnitus.   Eyes: Negative for blurred vision and double vision.  Respiratory: Negative for cough, shortness of breath and wheezing.   Cardiovascular: Negative for chest pain, orthopnea and PND.  Gastrointestinal: Negative for nausea, vomiting, abdominal pain and diarrhea.  Genitourinary: Negative for dysuria and hematuria.  Neurological: Negative for dizziness, sensory change and focal weakness.  All other systems reviewed and are negative.   Nutrition: Heart Healthy Tolerating Diet: Yes Tolerating PT:  Ambulatory.   DRUG ALLERGIES:  No Known Allergies  VITALS:  Blood pressure 104/68, pulse 119, temperature 97.9 F (36.6 C), temperature source Oral, resp. rate 18, height 5\' 1"  (1.549 m), weight 74.118 kg (163 lb 6.4 oz), last menstrual period 10/18/2012, SpO2 92 %.  PHYSICAL EXAMINATION:   Physical Exam  GENERAL:  65 y.o.-year-old patient lying in the bed with no acute distress.  EYES: Pupils equal, round, reactive to light and accommodation. No scleral icterus. Extraocular muscles intact.  HEENT: Head atraumatic, normocephalic. Oropharynx and nasopharynx clear.  NECK:  Supple, no jugular venous distention. No thyroid enlargement, no tenderness.  LUNGS: Normal breath sounds bilaterally, no wheezing, rales, rhonchi. No use of accessory muscles of respiration.  CARDIOVASCULAR: S1, S2 Tachycardic. No murmurs, rubs, or gallops.  ABDOMEN: Soft, nontender, nondistended. Bowel sounds present. No organomegaly or mass.  EXTREMITIES: No cyanosis,  clubbing or edema b/l.    NEUROLOGIC: Cranial nerves II through XII are intact. No focal Motor or sensory deficits b/l.   PSYCHIATRIC: The patient is alert and oriented x 3.  SKIN: No obvious rash, lesion, or ulcer.    LABORATORY PANEL:   CBC  Recent Labs Lab 09/01/15 0400  WBC 11.2*  HGB 12.8  HCT 39.9  PLT 277   ------------------------------------------------------------------------------------------------------------------  Chemistries   Recent Labs Lab 08/31/15 2022  09/02/15 0538  NA 139  < > 141  K 4.2  < > 3.9  CL 107  < > 110  CO2 24  < > 27  GLUCOSE 117*  < > 114*  BUN 22*  < > 21*  CREATININE 0.85  < > 0.96  CALCIUM 9.2  < > 8.7*  AST 24  --   --   ALT 19  --   --   ALKPHOS 90  --   --   BILITOT 0.5  --   --   < > = values in this interval not displayed. ------------------------------------------------------------------------------------------------------------------  Cardiac Enzymes  Recent Labs Lab 09/01/15 1423  TROPONINI 0.03   ------------------------------------------------------------------------------------------------------------------  RADIOLOGY:  Dg Chest 2 View  08/31/2015  CLINICAL DATA:  65 year old female with a history of shortness of breath and cough. EXAM: CHEST - 2 VIEW COMPARISON:  07/23/2015, 06/25/2015 FINDINGS: Cardiomediastinal silhouette unchanged, upper limits normal. Indistinct margins of the vasculature centrally. Interlobular septal thickening bilaterally with mixed interstitial and airspace disease, radiating pattern. No pneumothorax. Blunting of the costophrenic sulcus on the lateral view. Unremarkable appearance of the upper abdomen. IMPRESSION: Evidence of congestive heart failure and developing edema. Atypical infection could have this appearance though  is favored to be less likely given the imaging history. Signed, Dulcy Fanny. Earleen Newport, DO [facility] Radiology Electronically  Signed   By: Corrie Mckusick D.O.   On: 08/31/2015 21:05     ASSESSMENT AND PLAN:   65 year old female with past medical history of migraines, hypertension, hyperlipidemia, history of overactive bladder, vaginal prolapse/cystocele who presented to the hospital due to shortness of breath and noted to be in congestive heart failure.  #1 CHF-acute systolic dysfunction. -Patient's echocardiogram showing ejection fraction of 35-45%. Likely rate related.  -At least well with IV Lasix and is clinically better. -Continue Coreg. Hold Ace given the relative hypotension. -Appreciate cardiology input.  #2 SVT/Sinus tachycardia-patient's heart rates are still labile. She is although not clinically symptomatic. -Continue Coreg for now. This is sinus tachycardia and no evidence of acute arrhythmia. Patient does subclinical hyperthyroidism and this is probably what is driving her heart rate.  #3 Subclinical Hyperthyroidism-she has had a low TSH now for quite a while. Her free T3 and total T4 levels have not been significantly elevated in the past. I have repeated them and they're still pending. -I will obtained a endocrinology consult and given the fact that she is not symptomatic with CHF and also persistent tachycardia she warrants treatment. She has been started on Tapazole and will advance dose as tolerated. -Patient will follow up with endocrinology as an outpatient and have repeat thyroid function test in the next few weeks.  #4 hyperlipidemia-continue simvastatin.  #5 hx of Migraines - stable.    Likely d/c home tomorrow.   All the records are reviewed and case discussed with Care Management/Social Workerr. Management plans discussed with the patient, family and they are in agreement.  CODE STATUS: Full  DVT Prophylaxis: Lovenox  TOTAL TIME TAKING CARE OF THIS PATIENT: 45 minutes.   POSSIBLE D/C IN 1-2 DAYS, DEPENDING ON CLINICAL CONDITION.  Greater than 50% of time spent in coordination  of care discussion of plan with patient's daughter, and also endocrinologist.   Henreitta Leber M.D on 09/02/2015 at 4:07 PM  Between 7am to 6pm - Pager - [phone]  After 6pm go to [email or web address] - password [email or web address] [facility]  Office  [phone]  CC: Primary care physician; Einar Pheasant, MD

## 2015-09-03 ENCOUNTER — Telehealth: Payer: Self-pay

## 2015-09-03 ENCOUNTER — Telehealth: Payer: Self-pay | Admitting: Internal Medicine

## 2015-09-03 LAB — T4: T4 TOTAL: 7.3 ug/dL (ref 4.5–12.0)

## 2015-09-03 LAB — T3, FREE: T3, Free: 3.5 pg/mL (ref 2.0–4.4)

## 2015-09-03 MED ORDER — METHIMAZOLE 10 MG PO TABS: 10.0000 mg | ORAL_TABLET | Freq: Every day | ORAL | Status: DC

## 2015-09-03 MED ORDER — FUROSEMIDE 10 MG/ML IJ SOLN: 20.0000 mg | Freq: Once | INTRAMUSCULAR | Status: DC

## 2015-09-03 MED ORDER — CARVEDILOL 6.25 MG PO TABS: 6.2500 mg | ORAL_TABLET | Freq: Two times a day (BID) | ORAL | Status: DC

## 2015-09-03 MED ORDER — FUROSEMIDE 20 MG PO TABS
20.0000 mg | ORAL_TABLET | Freq: Every day | ORAL | Status: DC
  Administered 2015-09-03: 20 mg via ORAL
  Filled 2015-09-03: qty 1

## 2015-09-03 MED ORDER — OMEPRAZOLE 20 MG PO CPDR: 20.0000 mg | DELAYED_RELEASE_CAPSULE | Freq: Every day | ORAL | Status: DC

## 2015-09-03 MED ORDER — CARVEDILOL 6.25 MG PO TABS
6.2500 mg | ORAL_TABLET | Freq: Two times a day (BID) | ORAL | Status: DC
  Administered 2015-09-03: 6.25 mg via ORAL
  Filled 2015-09-03: qty 1

## 2015-09-03 MED ORDER — FUROSEMIDE 20 MG PO TABS: 20.0000 mg | ORAL_TABLET | Freq: Every day | ORAL | Status: DC

## 2015-09-03 NOTE — Progress Notes (Signed)
MD sainani was notified of 7 beat run of v tach. No further orders.

## 2015-09-03 NOTE — Progress Notes (Signed)
A & O.Sinus tachy. Room air. Pt reports no pain. AMbulated in the room. IV and tele removed. Discharge instructions given to pt. Prescriptions given to pt. Husband to take pt home. Pt has no further concerns at this time.

## 2015-09-03 NOTE — Progress Notes (Signed)
Pt complained of difficulty breathing at times on and off throughout night while laying flat and after coughing. Pt given relief while laying on side, having bed slightly elevated or sitting up. Pt also complained of wheezing during AM round. Auscultated lungs. Clear bilaterally. 02 saturation level in upper 90s. No signs of distress. MD notified. Acknowledged. No new orders placed. Will continue to monitor.

## 2015-09-03 NOTE — Telephone Encounter (Signed)
HFU, Pt is being discharged today from hospital. Diagnosis is CHF. Pt is scheduled for 09/06/2015 @2 :30pm. I did advised her to let pt know if bad weather we will call and notify pt. Thank You!

## 2015-09-03 NOTE — Telephone Encounter (Signed)
Perfect.  Thank you!  Will continue to follow.

## 2015-09-03 NOTE — Telephone Encounter (Signed)
Transition Care Management Follow-up Telephone Call   Date discharged? 09/03/15   How have you been since you were released from the hospital? Doing great! Still coughing, but other than that I feel much better.  No shortness of breath.   Do you understand why you were in the hospital? Yes   Do you understand the discharge instructions? Yes and my husband is home with me to help with everything.   Where were you discharged to? Home   Items Reviewed:  Medications reviewed: Yes, taking medications as prescribed.  Allergies reviewed: Yes, no changes  Dietary changes reviewed: Yes, low Sodium, no problems  Referrals reviewed:  Yes, Cardiology appointment scheduled.   Functional Questionnaire:   Activities of Daily Living (ADLs):   She states they are independent in the following: Independent in all ADLs. States they require assistance with the following: Does not require assistance at this time.   Any transportation issues/concerns?: No.   Any patient concerns? Not at this time.     Confirmed importance and date/time of follow-up visits scheduled Yes, appointment made 09/06/15 at 2:30.  Provider Appointment booked with Dr. Nicki Reaper (PCP).  Confirmed with patient if condition begins to worsen call PCP or go to the ER.  Patient was given the office number and encouraged to call back with question or concerns.  : Yes, patient verbalized understanding.

## 2015-09-05 ENCOUNTER — Encounter: Payer: Self-pay | Admitting: Internal Medicine

## 2015-09-06 ENCOUNTER — Encounter: Payer: Self-pay | Admitting: Internal Medicine

## 2015-09-06 ENCOUNTER — Ambulatory Visit (INDEPENDENT_AMBULATORY_CARE_PROVIDER_SITE_OTHER): Payer: Managed Care, Other (non HMO) | Admitting: Internal Medicine

## 2015-09-06 VITALS — BP 84/60 | HR 121 | Temp 97.5°F | Resp 18 | Ht 61.0 in | Wt 164.5 lb

## 2015-09-06 DIAGNOSIS — I5021 Acute systolic (congestive) heart failure: Secondary | ICD-10-CM | POA: Diagnosis not present

## 2015-09-06 DIAGNOSIS — R109 Unspecified abdominal pain: Secondary | ICD-10-CM | POA: Diagnosis not present

## 2015-09-06 DIAGNOSIS — E7800 Pure hypercholesterolemia, unspecified: Secondary | ICD-10-CM | POA: Diagnosis not present

## 2015-09-06 DIAGNOSIS — I471 Supraventricular tachycardia: Secondary | ICD-10-CM | POA: Diagnosis not present

## 2015-09-06 MED ORDER — CEPHALEXIN 500 MG PO CAPS: 500.0000 mg | ORAL_CAPSULE | Freq: Three times a day (TID) | ORAL | Status: DC

## 2015-09-06 NOTE — Patient Instructions (Signed)
Increase omeprazole to twice a day.  Take on in the am 30 minutes before breakfast and one 69min before your evening meal.

## 2015-09-06 NOTE — Progress Notes (Signed)
[name], female   DOB: 03/29/1951, 65 y.o.   MRN: [id number]   Subjective:    [name], female    DOB: 11-18-1950, 65 y.o.   MRN: [id number]  HPI  Patient with past history of CHF, hypercholesterolemia and hypertension.  She comes in today for a hospital follow up.  She was recently admitted with increased heart rate, sob.  Noted with SVT on arrival.  See ER note and hospital notes. Was placed on cardizem drip.  Evaluated by cardiology.  Placed on coreg.  ECHO with EF 35-45%.  Endocrinology evaluated and she was started on tapazole.  Suppressed tsh, but free T4 and free T3 wnl.  She was discharged on lasix and coreg.  She does feel better.  Still with increased fatigue.  Still with some dry cough.  Gets sob with lying flat.  In hospital noticed increased RUQ pain.  Intermittently notices epigastric pain.  No increased acid reflux.  Does report the persistent cough.  Some nausea.  No vomiting.  She is eating.  Watching her diet.  Due to see cardiology tomorrow.     Past Medical History  Diagnosis Date  . Hyperlipidemia   . Migraines   . Hypertension   . Allergy   . Personal history of colonic polyps   . Cancer ([place])     basal cell  . Congestive heart failure [facility])    Past Surgical History  Procedure Laterality Date  . Tubal ligation    . Finger surgery    . Cholecystectomy  2006  . Vaginal hysterectomy      midurethral sling  . Hysteroscopy  2011, 10/2010   Family History  Problem Relation Age of Onset  . Hypertension Mother   . Stroke Mother   . Hypercholesterolemia Father   . Diabetes Mother   . Heart disease Father     MI  . Hypercholesterolemia Father   . Hypertension Father   . Stroke Father   . Breast cancer Neg Hx   . Colon cancer Neg Hx    Social History   Social History  . Marital Status: Married    Spouse Name: N/A  . Number of Children: 2  . Years of Education: N/A   Social History Main Topics  .  Smoking status: Never Smoker   . Smokeless tobacco: Never Used  . Alcohol Use: 0.0 oz/week    0 Standard drinks or equivalent per week     Comment: Socially  . Drug Use: No  . Sexual Activity: Not Asked   Other Topics Concern  . None   Social History Narrative    Outpatient Encounter Prescriptions as of 09/06/2015  Medication Sig  . aspirin 81 MG tablet Take 81 mg by mouth daily.  . calcium carbonate (OS-CAL) 600 MG TABS Take 600 mg by mouth at bedtime.   . carvedilol (COREG) 6.25 MG tablet Take 1 tablet (6.25 mg total) by mouth 2 (two) times daily with a meal.  . cholecalciferol (VITAMIN D) 1000 UNITS tablet Take 2,000 Units by mouth at bedtime.   Marland Kitchen estradiol (ESTRACE) 0.1 MG/GM vaginal cream Place 1 Applicatorful vaginally 2 (two) times a week.  . fexofenadine (ALLEGRA) 180 MG tablet Take 180 mg by mouth at bedtime.   . furosemide (LASIX) 20 MG tablet Take 1 tablet (20 mg total) by mouth daily.  . methimazole (TAPAZOLE) 10 MG tablet Take 1 tablet (10 mg total) by mouth daily.  . Multiple Vitamin (  MULTIVITAMIN WITH MINERALS) TABS tablet Take 1 tablet by mouth at bedtime.  Marland Kitchen omeprazole (PRILOSEC) 20 MG capsule Take 1 capsule (20 mg total) by mouth daily.  . simvastatin (ZOCOR) 10 MG tablet Take 10 mg by mouth at bedtime.  . SUMAtriptan (IMITREX) 100 MG tablet Take 100 mg by mouth every 2 (two) hours as needed for migraine. May repeat in 2 hours if headache persists or recurs.  Marland Kitchen zonisamide (ZONEGRAN) 100 MG capsule Take 1 capsule (100 mg total) by mouth at bedtime.  . cephALEXin (KEFLEX) 500 MG capsule Take 1 capsule (500 mg total) by mouth 3 (three) times daily.   No facility-administered encounter medications on file as of 09/06/2015.    Review of Systems  Constitutional: Positive for fatigue. Negative for unexpected weight change.  HENT: Negative for congestion and sinus pressure.   Eyes: Negative for discharge and redness.  Respiratory: Positive for cough and shortness of breath  (breathing overall is better.  not back to her normal.  gets sob with lying flat. ). Negative for chest tightness.   Cardiovascular: Negative for chest pain, palpitations and leg swelling.  Gastrointestinal: Positive for nausea. Negative for vomiting, abdominal pain and diarrhea.  Genitourinary: Negative for dysuria and difficulty urinating.  Musculoskeletal: Negative for back pain and joint swelling.  Skin: Negative for color change and rash.  Neurological: Negative for dizziness, light-headedness and headaches.  Psychiatric/Behavioral: Negative for dysphoric mood and agitation.       Objective:     Blood pressure rechecked by me:  104-110/68-70  Physical Exam  Constitutional: She appears well-developed and well-nourished.  HENT:  Nose: Nose normal.  Mouth/Throat: Oropharynx is clear and moist.  Eyes: Conjunctivae are normal. Right eye exhibits no discharge. Left eye exhibits no discharge.  Neck: Neck supple. No thyromegaly present.  Cardiovascular: Regular rhythm.   Rate 104-108.  SOB when lying flat - noticed on exam.   Pulmonary/Chest: No respiratory distress. She has no wheezes.  Some question of crackles in right lower lung.    Abdominal: Soft. Bowel sounds are normal. There is no tenderness.  Musculoskeletal: She exhibits no edema or tenderness.  Lymphadenopathy:    She has no cervical adenopathy.  Skin: No rash noted. No erythema.  Psychiatric: She has a normal mood and affect. Her behavior is normal.    BP 84/60 mmHg  Pulse 121  Temp(Src) 97.5 F (36.4 C) (Oral)  Resp 18  Ht 5\' 1"  (1.549 m)  Wt 164 lb 8 oz (74.617 kg)  BMI 31.10 kg/m2  SpO2 96%  LMP 10/18/2012 Wt Readings from Last 3 Encounters:  09/06/15 164 lb 8 oz (74.617 kg)  09/03/15 164 lb (74.39 kg)  08/09/15 166 lb 8 oz (75.524 kg)     Lab Results  Component Value Date   WBC 11.2* 09/01/2015   HGB 12.8 09/01/2015   HCT 39.9 09/01/2015   PLT 277 09/01/2015   GLUCOSE 114* 09/02/2015   CHOL 161  04/05/2015   TRIG 104.0 04/05/2015   HDL 50.60 04/05/2015   LDLCALC 90 04/05/2015   ALT 19 08/31/2015   AST 24 08/31/2015   NA 141 09/02/2015   K 3.9 09/02/2015   CL 110 09/02/2015   CREATININE 0.96 09/02/2015   BUN 21* 09/02/2015   CO2 27 09/02/2015   TSH 0.180* 09/01/2015       Assessment & Plan:   Problem List Items Addressed This Visit    Abdominal pain    RUQ pain and epigastric pain intermittently.  Check liver panel and metabolic panel.  Increase omeprazole to bid.  May need ultrasound vs CT scan.  Is eating.        Congestive heart failure, acute ([place])    Admitted with CHF.  ECHO as outlined.  On carvedilol.  Also on lasix.  Breathing overall is better.  With dry cough.  Sob with lying flat.  Continue current regimen for now.  Keep appt with cardiology tomorrow.  Avoid increased salt intake.  CHF clinic.        Relevant Medications   aspirin 81 MG tablet   Other Relevant Orders   Hepatic function panel   Basic metabolic panel   Hypercholesterolemia    On simvastatin.  Low cholesterol diet and exercise.  Follow lipid panel and liver function tests.        Relevant Medications   aspirin 81 MG tablet   SVT (supraventricular tachycardia) (HCC) - Primary    On carvedilol.  Heart rate as outlined.  Overall improved from  Hospitalization.  Keep f/u with cardiology tomorrow.        Relevant Medications   aspirin 81 MG tablet   Other Relevant Orders   CBC with Differential/Platelet   Hepatic function panel       Einar Pheasant, MD

## 2015-09-07 ENCOUNTER — Encounter: Payer: Self-pay | Admitting: Internal Medicine

## 2015-09-07 DIAGNOSIS — R109 Unspecified abdominal pain: Secondary | ICD-10-CM | POA: Insufficient documentation

## 2015-09-07 LAB — BASIC METABOLIC PANEL
BUN: 23 mg/dL (ref 6–23)
CO2: 26 mEq/L (ref 19–32)
Calcium: 9.7 mg/dL (ref 8.4–10.5)
Chloride: 104 mEq/L (ref 96–112)
Creatinine, Ser: 0.97 mg/dL (ref 0.40–1.20)
GFR: 61.7 mL/min (ref >60.00)
GLUCOSE: 95 mg/dL (ref 70–99)
POTASSIUM: 4.6 meq/L (ref 3.5–5.1)
SODIUM: 139 meq/L (ref 135–145)

## 2015-09-07 LAB — HEPATIC FUNCTION PANEL
ALBUMIN: 4 g/dL (ref 3.5–5.2)
ALT: 21 U/L (ref 0–35)
AST: 20 U/L (ref 0–37)
Alkaline Phosphatase: 100 U/L (ref 39–117)
BILIRUBIN TOTAL: 0.4 mg/dL (ref 0.2–1.2)
Bilirubin, Direct: 0.1 mg/dL (ref 0.0–0.3)
Total Protein: 7.2 g/dL (ref 6.0–8.3)

## 2015-09-07 LAB — CBC WITH DIFFERENTIAL/PLATELET
BASOS PCT: 0.3 % (ref 0.0–3.0)
Basophils Absolute: 0 10*3/uL (ref 0.0–0.1)
EOS PCT: 3.7 % (ref 0.0–5.0)
Eosinophils Absolute: 0.4 10*3/uL (ref 0.0–0.7)
HCT: 41 % (ref 36.0–46.0)
HEMOGLOBIN: 13.4 g/dL (ref 12.0–15.0)
LYMPHS ABS: 3.4 10*3/uL (ref 0.7–4.0)
Lymphocytes Relative: 31.7 % (ref 12.0–46.0)
MCHC: 32.8 g/dL (ref 30.0–36.0)
MCV: 86.8 fl (ref 78.0–100.0)
MONO ABS: 0.7 10*3/uL (ref 0.1–1.0)
MONOS PCT: 6.8 % (ref 3.0–12.0)
Neutro Abs: 6.3 10*3/uL (ref 1.4–7.7)
Neutrophils Relative %: 57.5 % (ref 43.0–77.0)
Platelets: 327 10*3/uL (ref 150.0–400.0)
RBC: 4.72 Mil/uL (ref 3.87–5.11)
RDW: 14.1 % (ref 11.5–15.5)
WBC: 10.9 10*3/uL — AB (ref 4.0–10.5)

## 2015-09-07 NOTE — Assessment & Plan Note (Signed)
On carvedilol.  Heart rate as outlined.  Overall improved from  Hospitalization.  Keep f/u with cardiology tomorrow.

## 2015-09-07 NOTE — Assessment & Plan Note (Signed)
RUQ pain and epigastric pain intermittently.  Check liver panel and metabolic panel.  Increase omeprazole to bid.  May need ultrasound vs CT scan.  Is eating.

## 2015-09-07 NOTE — Discharge Summary (Signed)
[facility] [place] NAME: Kristi Greer    MR#:  [id number]  DATE OF BIRTH:  [birth year]  DATE OF ADMISSION:  08/31/2015 ADMITTING PHYSICIAN: Lytle Butte, MD  DATE OF DISCHARGE: 09/03/2015 11:24 AM  PRIMARY CARE PHYSICIAN: Einar Pheasant, MD    ADMISSION DIAGNOSIS:  Elevated troponin [R79.89] Atrial fibrillation with rapid ventricular response (HCC) [I48.91] Acute on chronic congestive heart failure, unspecified congestive heart failure type ([place]) [I50.9]  DISCHARGE DIAGNOSIS:  Principal Problem:   Congestive heart failure, acute ([place]) Active Problems:   SVT (supraventricular tachycardia) ([place])   Demand ischemia ([place])   SECONDARY DIAGNOSIS:   Past Medical History  Diagnosis Date  . Hyperlipidemia   . Migraines   . Hypertension   . Allergy   . Personal history of colonic polyps   . Cancer ([place])     basal cell  . Congestive heart failure [facility])     HOSPITAL COURSE:   65 year old female with past medical history of migraines, hypertension, hyperlipidemia, history of overactive bladder, vaginal prolapse/cystocele who presented to the hospital due to shortness of breath and noted to be in congestive heart failure.  #1 CHF-patient was admitted to the hospital with a working diagnosis of CHF. She was diuresis with IV Lasix and clinically improved with diuresis. Her Lasix was then held due to some relative hypotension. -Patient had echocardiogram which showed ejection fraction of 35-40% and therefore this was likely acute systolic dysfunction. Patient was seen by cardiology and they agreed with this management. -Patient was discharged on oral Coreg but not on ACE inhibitor due to relative hypotension.  #2 SVT/Sinus tachycardia-she this was thought to be secondary to atrial fibrillation but upon further evaluation this was likely an SVT or even sinus tachycardia. Patient was started on some oral Cardizem but her heart rates  remained high. -Patient was noted to have subclinical hyperthyroidism which was probably driving her heart rate. She was discharged on oral Coreg given her CHF and also started on Tapazole for her hyperthyroidism. -Her heart rates with a bit labile on discharge but she was clinically asymptomatic. Patient and patient's family was advised that her heart rates would remain somewhat elevated until her thyroid levels would equilibrate.   #3 Subclinical Hyperthyroidism-she has had a low TSH now for quite a while since 2015. Her free T3 and total T4 levels have not been significantly elevated in the past and neither were they quite elevated during this hospitalization.   - an endocrinology consult was obtained and given the fact that she was symptomatic with CHF and also persistent tachycardia she warrants treatment. She was started on Tapazole and was discharged on it.  -Patient will follow up with endocrinology as an outpatient and have repeat thyroid function test in the next few weeks.  #4 hyperlipidemia- she will cont. simvastatin.  #5 hx of Migraines - she will cont. sumatriptan. She had no migraines while in the hospital.   DISCHARGE CONDITIONS:   Stable  CONSULTS OBTAINED:  Treatment Team:  Teodoro Spray, MD  DRUG ALLERGIES:  No Known Allergies  DISCHARGE MEDICATIONS:   Discharge Medication List as of 09/03/2015 10:58 AM    START taking these medications   Details  carvedilol (COREG) 6.25 MG tablet Take 1 tablet (6.25 mg total) by mouth 2 (two) times daily with a meal., Starting 09/03/2015, Until Discontinued, Print    furosemide (LASIX) 20 MG tablet Take 1 tablet (20 mg total) by mouth daily., Starting  09/03/2015, Until Discontinued, Print    methimazole (TAPAZOLE) 10 MG tablet Take 1 tablet (10 mg total) by mouth daily., Starting 09/03/2015, Until Discontinued, Print    omeprazole (PRILOSEC) 20 MG capsule Take 1 capsule (20 mg total) by mouth daily., Starting 09/03/2015, Until  Discontinued, Print      CONTINUE these medications which have NOT CHANGED   Details  calcium carbonate (OS-CAL) 600 MG TABS Take 600 mg by mouth at bedtime. , Until Discontinued, Historical Med    cholecalciferol (VITAMIN D) 1000 UNITS tablet Take 1,000 Units by mouth at bedtime. , Until Discontinued, Historical Med    estradiol (ESTRACE) 0.1 MG/GM vaginal cream Place 1 Applicatorful vaginally 2 (two) times a week., Until Discontinued, Historical Med    fexofenadine (ALLEGRA) 180 MG tablet Take 180 mg by mouth at bedtime. , Until Discontinued, Historical Med    Multiple Vitamin (MULTIVITAMIN WITH MINERALS) TABS tablet Take 1 tablet by mouth at bedtime., Until Discontinued, Historical Med    simvastatin (ZOCOR) 10 MG tablet Take 10 mg by mouth at bedtime., Until Discontinued, Historical Med    SUMAtriptan (IMITREX) 100 MG tablet Take 100 mg by mouth every 2 (two) hours as needed for migraine. May repeat in 2 hours if headache persists or recurs., Until Discontinued, Historical Med    zonisamide (ZONEGRAN) 100 MG capsule Take 1 capsule (100 mg total) by mouth at bedtime., Starting 12/02/2014, Until Discontinued, Normal         DISCHARGE INSTRUCTIONS:   DIET:  Cardiac diet  DISCHARGE CONDITION:  Stable  ACTIVITY:  Activity as tolerated  OXYGEN:  Home Oxygen: No.   Oxygen Delivery: room air  DISCHARGE LOCATION:  home   If you experience worsening of your admission symptoms, develop shortness of breath, life threatening emergency, suicidal or homicidal thoughts you must seek medical attention immediately by calling 911 or calling your MD immediately  if symptoms less severe.  You Must read complete instructions/literature along with all the possible adverse reactions/side effects for all the Medicines you take and that have been prescribed to you. Take any new Medicines after you have completely understood and accpet all the possible adverse reactions/side effects.   Please  note  You were cared for by a hospitalist during your hospital stay. If you have any questions about your discharge medications or the care you received while you were in the hospital after you are discharged, you can call the unit and asked to speak with the hospitalist on call if the hospitalist that took care of you is not available. Once you are discharged, your primary care physician will handle any further medical issues. Please note that NO REFILLS for any discharge medications will be authorized once you are discharged, as it is imperative that you return to your primary care physician (or establish a relationship with a primary care physician if you do not have one) for your aftercare needs so that they can reassess your need for medications and monitor your lab values.     Today   No Chest pain, shortness of breath, palpitations presently.    VITAL SIGNS:  Blood pressure 97/56, pulse 108, temperature 97.9 F (36.6 C), temperature source Oral, resp. rate 20, height 5\' 1"  (1.549 m), weight 74.39 kg (164 lb), last menstrual period 10/18/2012, SpO2 98 %.  I/O:  No intake or output data in the 24 hours ending 09/07/15 2042  PHYSICAL EXAMINATION:   GENERAL: 65 y.o.-year-old patient lying in the bed with no acute distress.  EYES: Pupils equal, round, reactive to light and accommodation. No scleral icterus. Extraocular muscles intact.  HEENT: Head atraumatic, normocephalic. Oropharynx and nasopharynx clear.  NECK: Supple, no jugular venous distention. No thyroid enlargement, no tenderness.  LUNGS: Normal breath sounds bilaterally, no wheezing, rales, rhonchi. No use of accessory muscles of respiration.  CARDIOVASCULAR: S1, S2 Tachycardic. No murmurs, rubs, or gallops.  ABDOMEN: Soft, nontender, nondistended. Bowel sounds present. No organomegaly or mass.  EXTREMITIES: No cyanosis, clubbing or edema b/l.  NEUROLOGIC: Cranial nerves II through XII are intact. No focal Motor or  sensory deficits b/l.  PSYCHIATRIC: The patient is alert and oriented x 3.  SKIN: No obvious rash, lesion, or ulcer.  DATA REVIEW:   CBC  Recent Labs Lab 09/06/15 1600  WBC 10.9*  HGB 13.4  HCT 41.0  PLT 327.0    Chemistries   Recent Labs Lab 09/06/15 1600  NA 139  K 4.6  CL 104  CO2 26  GLUCOSE 95  BUN 23  CREATININE 0.97  CALCIUM 9.7  AST 20  ALT 21  ALKPHOS 100  BILITOT 0.4    Cardiac Enzymes  Recent Labs Lab 09/01/15 1423  TROPONINI 0.03    RADIOLOGY:  No results found.    Management plans discussed with the patient, family and they are in agreement.  CODE STATUS:  Code Status History    Date Active Date Inactive Code Status Order ID Comments User Context   09/01/2015 12:05 AM 09/03/2015  2:24 PM Full Code [id number]  Lytle Butte, MD ED    Advance Directive Documentation        Most Recent Value   Type of Advance Directive  Healthcare Power of [place], Living will   Pre-existing out of facility DNR order (yellow form or pink MOST form)     "MOST" Form in Place?        TOTAL TIME TAKING CARE OF THIS PATIENT: 40 minutes.    Henreitta Leber M.D on 09/07/2015 at 8:42 PM  Between 7am to 6pm - Pager - [phone]  After 6pm go to [email or web address] - password [email or web address] [facility]  Office  [phone]  CC: Primary care physician; Einar Pheasant, MD

## 2015-09-07 NOTE — Assessment & Plan Note (Signed)
Admitted with CHF.  ECHO as outlined.  On carvedilol.  Also on lasix.  Breathing overall is better.  With dry cough.  Sob with lying flat.  Continue current regimen for now.  Keep appt with cardiology tomorrow.  Avoid increased salt intake.  CHF clinic.

## 2015-09-07 NOTE — Assessment & Plan Note (Signed)
On simvastatin.  Low cholesterol diet and exercise.  Follow lipid panel and liver function tests.   

## 2015-09-08 ENCOUNTER — Encounter: Payer: Self-pay | Admitting: Internal Medicine

## 2015-09-21 ENCOUNTER — Other Ambulatory Visit: Payer: Self-pay

## 2015-09-21 ENCOUNTER — Encounter: Payer: Self-pay | Admitting: Internal Medicine

## 2015-09-21 MED ORDER — OMEPRAZOLE 20 MG PO CPDR: 20.0000 mg | DELAYED_RELEASE_CAPSULE | Freq: Two times a day (BID) | ORAL | Status: DC

## 2015-09-21 NOTE — Telephone Encounter (Signed)
rx sent in for omeprazole #60 with 2 refills.  Directions changed to bid.

## 2015-09-21 NOTE — Telephone Encounter (Signed)
Patient sent a email needing this medication changed to twice a day since she is unable to get a refill because of it saying one a day. Please advise?

## 2015-09-22 ENCOUNTER — Encounter: Payer: Self-pay | Admitting: Family

## 2015-09-22 ENCOUNTER — Ambulatory Visit: Payer: Managed Care, Other (non HMO) | Attending: Family | Admitting: Family

## 2015-09-22 VITALS — BP 93/70 | HR 108 | Resp 20 | Ht 61.0 in | Wt 162.0 lb

## 2015-09-22 DIAGNOSIS — Z79899 Other long term (current) drug therapy: Secondary | ICD-10-CM | POA: Diagnosis not present

## 2015-09-22 DIAGNOSIS — R000 Tachycardia, unspecified: Secondary | ICD-10-CM | POA: Diagnosis not present

## 2015-09-22 DIAGNOSIS — E785 Hyperlipidemia, unspecified: Secondary | ICD-10-CM | POA: Insufficient documentation

## 2015-09-22 DIAGNOSIS — R42 Dizziness and giddiness: Secondary | ICD-10-CM | POA: Diagnosis not present

## 2015-09-22 DIAGNOSIS — I10 Essential (primary) hypertension: Secondary | ICD-10-CM | POA: Diagnosis not present

## 2015-09-22 DIAGNOSIS — I5022 Chronic systolic (congestive) heart failure: Secondary | ICD-10-CM | POA: Diagnosis not present

## 2015-09-22 DIAGNOSIS — Z7982 Long term (current) use of aspirin: Secondary | ICD-10-CM | POA: Insufficient documentation

## 2015-09-22 DIAGNOSIS — I950 Idiopathic hypotension: Secondary | ICD-10-CM

## 2015-09-22 DIAGNOSIS — I959 Hypotension, unspecified: Secondary | ICD-10-CM | POA: Insufficient documentation

## 2015-09-22 NOTE — Patient Instructions (Signed)
Continue weighing daily and call for an overnight weight gain of > 2 pounds or a weekly weight gain of >5 pounds. 

## 2015-09-22 NOTE — Progress Notes (Signed)
Subjective:    [name], female    DOB: 21-Nov-1950, 65 y.o.   MRN: [id number]  Congestive Heart Failure Presents for initial visit. The disease course has been stable. Associated symptoms include fatigue and shortness of breath. Pertinent negatives include no abdominal pain, chest pain, chest pressure, edema, orthopnea or palpitations. The symptoms have been stable. Past treatments include beta blockers and salt and fluid restriction. The treatment provided moderate relief. Compliance with prior treatments has been good. Her past medical history is significant for HTN. She has multiple 1st degree relatives with heart disease. Compliance with total regimen is 76-100%.  Other This is a new (tachycardia) problem. The current episode started more than 1 month ago. The problem occurs intermittently. The problem has been gradually improving. Associated symptoms include fatigue, headaches and neck pain (arthritis). Pertinent negatives include no abdominal pain, chest pain, congestion, coughing, nausea, sore throat or weakness. The symptoms are aggravated by walking and exertion. She has tried rest for the symptoms. The treatment provided mild relief.    Past Medical History  Diagnosis Date  . Hyperlipidemia   . Migraines   . Hypertension   . Allergy   . Personal history of colonic polyps   . Cancer ([place])     basal cell  . Congestive heart failure [facility])     Past Surgical History  Procedure Laterality Date  . Tubal ligation    . Finger surgery    . Cholecystectomy  2006  . Vaginal hysterectomy      midurethral sling  . Hysteroscopy  2011, 10/2010    Family History  Problem Relation Age of Onset  . Hypertension Mother   . Stroke Mother   . Hypercholesterolemia Father   . Diabetes Mother   . Heart disease Father     MI  . Hypercholesterolemia Father   . Hypertension Father   . Stroke Father   . Breast cancer Neg Hx   . Colon cancer Neg Hx     Social  History  Substance Use Topics  . Smoking status: Never Smoker   . Smokeless tobacco: Never Used  . Alcohol Use: 0.0 oz/week    0 Standard drinks or equivalent per week     Comment: Socially    No Known Allergies  Prior to Admission medications   Medication Sig Start Date End Date Taking? Authorizing Provider  aspirin 81 MG tablet Take 81 mg by mouth daily.   Yes Historical Provider, MD  calcium carbonate (OS-CAL) 600 MG TABS Take 600 mg by mouth at bedtime.    Yes Historical Provider, MD  carvedilol (COREG) 6.25 MG tablet Take 1 tablet (6.25 mg total) by mouth 2 (two) times daily with a meal. 09/03/15  Yes Henreitta Leber, MD  cholecalciferol (VITAMIN D) 1000 UNITS tablet Take 2,000 Units by mouth at bedtime.    Yes Historical Provider, MD  fexofenadine (ALLEGRA) 180 MG tablet Take 180 mg by mouth at bedtime.    Yes Historical Provider, MD  furosemide (LASIX) 20 MG tablet Take 1 tablet (20 mg total) by mouth daily. 09/03/15  Yes Henreitta Leber, MD  methimazole (TAPAZOLE) 10 MG tablet Take 1 tablet (10 mg total) by mouth daily. 09/03/15  Yes Henreitta Leber, MD  omeprazole (PRILOSEC) 20 MG capsule Take 1 capsule (20 mg total) by mouth 2 (two) times daily before a meal. 09/21/15  Yes Einar Pheasant, MD  simvastatin (ZOCOR) 10 MG tablet Take 10 mg by mouth at  bedtime.   Yes Historical Provider, MD  SUMAtriptan (IMITREX) 100 MG tablet Take 100 mg by mouth every 2 (two) hours as needed for migraine. May repeat in 2 hours if headache persists or recurs.   Yes Historical Provider, MD  zonisamide (ZONEGRAN) 100 MG capsule Take 1 capsule (100 mg total) by mouth at bedtime. 12/02/14  Yes Einar Pheasant, MD     Review of Systems  Constitutional: Positive for fatigue. Negative for appetite change.  HENT: Negative for congestion, rhinorrhea and sore throat.   Eyes: Negative.   Respiratory: Positive for shortness of breath. Negative for cough, chest tightness and wheezing.   Cardiovascular: Negative for  chest pain, palpitations and leg swelling.  Gastrointestinal: Negative for nausea, abdominal pain and abdominal distention.  Endocrine: Negative.   Genitourinary: Negative.   Musculoskeletal: Positive for back pain (low back pain intermittently) and neck pain (arthritis).  Skin: Negative.   Allergic/Immunologic: Negative.   Neurological: Positive for light-headedness and headaches. Negative for dizziness and weakness.  Hematological: Negative for adenopathy. Does not bruise/bleed easily.  Psychiatric/Behavioral: Negative for sleep disturbance (sleeping on 1 pillow) and dysphoric mood. The patient is not nervous/anxious.        Objective:   Physical Exam  Constitutional: She is oriented to person, place, and time. She appears well-developed and well-nourished.  HENT:  Head: Normocephalic and atraumatic.  Eyes: Conjunctivae are normal. Pupils are equal, round, and reactive to light.  Neck: Normal range of motion. Neck supple.  Cardiovascular: Regular rhythm.  Tachycardia present.   Pulmonary/Chest: Effort normal. She has no wheezes. She has no rales.  Abdominal: Soft. She exhibits no distension. There is no tenderness.  Musculoskeletal: She exhibits no edema or tenderness.  Neurological: She is alert and oriented to person, place, and time.  Skin: Skin is warm and dry.  Psychiatric: She has a normal mood and affect. Her behavior is normal. Thought content normal.  Nursing note and vitals reviewed.   BP 93/70 mmHg  Pulse 108  Resp 20  Ht 5\' 1"  (1.549 m)  Wt 162 lb (73.483 kg)  BMI 30.63 kg/m2  SpO2 99%  LMP 10/18/2012       Assessment & Plan:  1: Chronic heart failure with reduced ejection fraction- Patient presents with fatigue and shortness of breath upon exertion which she says is improving. She felt a little short of breath upon walking into the office today but once she sat down for a few minutes, her breathing improved. She is already weighing herself daily and says that  her weight has been stable. Discussed the importance of calling for an overnight weight gain of >2 pounds or a weekly weight gain of >5 pounds. She has decreased her salt usage and is now using Mrs. Dash seasoning or NoSalt. She has also been reading food labels. Discussed the importance of following a 2000mg  sodium diet and written information was given to her about this. A low sodium cookbook was given to her as well. Has received her flu vaccine this season. Has recently seen her cardiologist and returns to him April 2017. 2: Hypotension- Blood pressure on the low side and she says that it's been low ever since she left the hospital. Occasional lightheadedness but no falls. Blood pressure is limiting the addition of ACE-i or ARB. 3: Tachycardia- Patient came into the hospital with SVT. She is currently tachycardic and she says that even when she gets up from a seated position just to go to the bathroom, her heart rate  will jump up into the 130's. Could possibly decrease diuretic so that her blood pressure might elevate so that her beta-blocker can possibly be increased.  Return here in 1 month or sooner for any questions/problems before then.

## 2015-09-23 ENCOUNTER — Other Ambulatory Visit: Payer: Managed Care, Other (non HMO)

## 2015-09-29 ENCOUNTER — Ambulatory Visit (INDEPENDENT_AMBULATORY_CARE_PROVIDER_SITE_OTHER): Payer: Managed Care, Other (non HMO) | Admitting: Internal Medicine

## 2015-09-29 ENCOUNTER — Ambulatory Visit: Payer: Managed Care, Other (non HMO) | Admitting: Internal Medicine

## 2015-09-29 ENCOUNTER — Telehealth: Payer: Self-pay

## 2015-09-29 ENCOUNTER — Encounter: Payer: Self-pay | Admitting: Internal Medicine

## 2015-09-29 VITALS — BP 110/70 | HR 114 | Temp 97.5°F | Ht 61.0 in | Wt 161.8 lb

## 2015-09-29 DIAGNOSIS — R079 Chest pain, unspecified: Secondary | ICD-10-CM | POA: Diagnosis not present

## 2015-09-29 DIAGNOSIS — R0602 Shortness of breath: Secondary | ICD-10-CM | POA: Diagnosis not present

## 2015-09-29 DIAGNOSIS — I471 Supraventricular tachycardia: Secondary | ICD-10-CM | POA: Diagnosis not present

## 2015-09-29 DIAGNOSIS — Z9109 Other allergy status, other than to drugs and biological substances: Secondary | ICD-10-CM | POA: Insufficient documentation

## 2015-09-29 DIAGNOSIS — E7800 Pure hypercholesterolemia, unspecified: Secondary | ICD-10-CM

## 2015-09-29 DIAGNOSIS — Z91048 Other nonmedicinal substance allergy status: Secondary | ICD-10-CM

## 2015-09-29 NOTE — Progress Notes (Signed)
Pre-visit discussion using our clinic review tool. No additional management support is needed unless otherwise documented below in the visit note.  

## 2015-09-29 NOTE — Assessment & Plan Note (Signed)
On carvedilol.  Has seen cardiology.  Still with episodes of increased heart rated.  With the sob with minimal walking, states heart rate will get as high as 140s.  Discussed with her today.  EKG with SR with no acute ischemic change - ventricular rate 100.  Have cardiology reevaluate with question of need for stress testing, monitor, etc.

## 2015-09-29 NOTE — Assessment & Plan Note (Signed)
On simvastatin.  Low cholesterol diet and exercise.   Lab Results  Component Value Date   CHOL 161 04/05/2015   HDL 50.60 04/05/2015   LDLCALC 90 04/05/2015   TRIG 104.0 04/05/2015   CHOLHDL 3 04/05/2015

## 2015-09-29 NOTE — Patient Instructions (Signed)
Saline nasal spray - flush nose at least 2-3x/day  nasacort nasal spray - 2 sprays each nostril one time per day.  Do this in the evening.  

## 2015-09-29 NOTE — Progress Notes (Signed)
[name], female   DOB: 01/10/1951, 65 y.o.   MRN: [id number]   Subjective:    [name], female    DOB: [birth year], 65 y.o.   MRN: [id number]  HPI  Patient with past history of hypercholesterolemia and recent diagnosis of SVT.  She was recently admitted for SVT.  See hospital records and my last note for details.  ECHO then revealed EF 35-40%.  Saw Dr Saralyn Pilar.  No changes made in medication.  Is on tapazole now.  Being followed by endocrinology.  Just recently had tapazole does decreased to 5mg  q day.  She reports that she is still having episodes of increased heart rate.  She cannot feel her heart racing.  She does report sob with exertion.  States yesterday, she walked from her building to her car and became more sob.  She also walked minimal amount at [facility] and had to sit down - she was sob.  Also noticed a little chest discomfort.  She had questions about her heart.  No chest pain now.  Does report some nasal stuffiness and sneezing starting yesterday.  Eating and drinking.  Bowels stable.     Past Medical History  Diagnosis Date  . Hyperlipidemia   . Migraines   . Hypertension   . Allergy   . Personal history of colonic polyps   . Cancer ([place])     basal cell  . Congestive heart failure [facility])    Past Surgical History  Procedure Laterality Date  . Tubal ligation    . Finger surgery    . Cholecystectomy  2006  . Vaginal hysterectomy      midurethral sling  . Hysteroscopy  2011, 10/2010   Family History  Problem Relation Age of Onset  . Hypertension Mother   . Stroke Mother   . Hypercholesterolemia Father   . Diabetes Mother   . Heart disease Father     MI  . Hypercholesterolemia Father   . Hypertension Father   . Stroke Father   . Breast cancer Neg Hx   . Colon cancer Neg Hx    Social History   Social History  . Marital Status: Married    Spouse Name: N/A  . Number of Children: 2  . Years of Education:  N/A   Social History Main Topics  . Smoking status: Never Smoker   . Smokeless tobacco: Never Used  . Alcohol Use: 0.0 oz/week    0 Standard drinks or equivalent per week     Comment: Socially  . Drug Use: No  . Sexual Activity: Not Asked   Other Topics Concern  . None   Social History Narrative    Outpatient Encounter Prescriptions as of 09/29/2015  Medication Sig  . aspirin 81 MG tablet Take 81 mg by mouth daily.  . calcium carbonate (OS-CAL) 600 MG TABS Take 600 mg by mouth at bedtime.   . carvedilol (COREG) 6.25 MG tablet Take 1 tablet (6.25 mg total) by mouth 2 (two) times daily with a meal.  . cholecalciferol (VITAMIN D) 1000 UNITS tablet Take 2,000 Units by mouth at bedtime.   . fexofenadine (ALLEGRA) 180 MG tablet Take 180 mg by mouth at bedtime.   . furosemide (LASIX) 20 MG tablet Take 1 tablet (20 mg total) by mouth daily.  . methimazole (TAPAZOLE) 10 MG tablet Take 1 tablet (10 mg total) by mouth daily. (Patient taking differently: Take 5 mg by mouth daily. )  .  omeprazole (PRILOSEC) 20 MG capsule Take 1 capsule (20 mg total) by mouth 2 (two) times daily before a meal.  . simvastatin (ZOCOR) 10 MG tablet Take 10 mg by mouth at bedtime.  . SUMAtriptan (IMITREX) 100 MG tablet Take 100 mg by mouth every 2 (two) hours as needed for migraine. May repeat in 2 hours if headache persists or recurs.  Marland Kitchen zonisamide (ZONEGRAN) 100 MG capsule Take 1 capsule (100 mg total) by mouth at bedtime.   No facility-administered encounter medications on file as of 09/29/2015.    Review of Systems  Constitutional: Positive for fatigue. Negative for appetite change and unexpected weight change.  HENT: Positive for congestion and postnasal drip.        Sneezing.   Eyes: Negative for pain and redness.  Respiratory: Positive for chest tightness and shortness of breath. Negative for cough.   Cardiovascular: Negative for leg swelling.       Increased heart rate as outlined.   Gastrointestinal:  Negative for nausea, vomiting, abdominal pain and diarrhea.  Genitourinary: Negative for dysuria and difficulty urinating.  Musculoskeletal: Negative for back pain and joint swelling.  Skin: Negative for color change and rash.  Neurological: Negative for dizziness, light-headedness and headaches.  Psychiatric/Behavioral: Negative for dysphoric mood and agitation.       Objective:     Blood pressure rechecked by me:  104/68  Physical Exam  Constitutional: She appears well-developed and well-nourished. No distress.  HENT:  Nose: Nose normal.  Mouth/Throat: Oropharynx is clear and moist.  Eyes: Right eye exhibits no discharge. Left eye exhibits no discharge.  Neck: Neck supple. No thyromegaly present.  Cardiovascular: Normal rate and regular rhythm.   Pulmonary/Chest: Breath sounds normal. No respiratory distress. She has no wheezes.  Abdominal: Soft. Bowel sounds are normal. There is no tenderness.  Musculoskeletal: She exhibits no edema or tenderness.  Lymphadenopathy:    She has no cervical adenopathy.  Skin: No rash noted. No erythema.  Psychiatric: She has a normal mood and affect. Her behavior is normal.    BP 110/70 mmHg  Pulse 114  Temp(Src) 97.5 F (36.4 C) (Oral)  Ht 5\' 1"  (1.549 m)  Wt 161 lb 12 oz (73.369 kg)  BMI 30.58 kg/m2  SpO2 94%  LMP 10/18/2012 Wt Readings from Last 3 Encounters:  09/29/15 161 lb 12 oz (73.369 kg)  09/22/15 162 lb (73.483 kg)  09/06/15 164 lb 8 oz (74.617 kg)     Lab Results  Component Value Date   WBC 10.9* 09/06/2015   HGB 13.4 09/06/2015   HCT 41.0 09/06/2015   PLT 327.0 09/06/2015   GLUCOSE 95 09/06/2015   CHOL 161 04/05/2015   TRIG 104.0 04/05/2015   HDL 50.60 04/05/2015   LDLCALC 90 04/05/2015   ALT 21 09/06/2015   AST 20 09/06/2015   NA 139 09/06/2015   K 4.6 09/06/2015   CL 104 09/06/2015   CREATININE 0.97 09/06/2015   BUN 23 09/06/2015   CO2 26 09/06/2015   TSH 0.180* 09/01/2015       Assessment & Plan:    Problem List Items Addressed This Visit    Environmental allergies    Saline nasal spray and nasacort nasal spray as outlined.  Follow.  No chest congestion.       Hypercholesterolemia    On simvastatin.  Low cholesterol diet and exercise.   Lab Results  Component Value Date   CHOL 161 04/05/2015   HDL 50.60 04/05/2015   LDLCALC 90 04/05/2015  TRIG 104.0 04/05/2015   CHOLHDL 3 04/05/2015        SVT (supraventricular tachycardia) ([facility])    On carvedilol.  Has seen cardiology.  Still with episodes of increased heart rated.  With the sob with minimal walking, states heart rate will get as high as 140s.  Discussed with her today.  EKG with SR with no acute ischemic change - ventricular rate 100.  Have cardiology reevaluate with question of need for stress testing, monitor, etc.         Other Visit Diagnoses    Chest pain, unspecified chest pain type    -  Primary    Relevant Orders    EKG 12-Lead (Completed)    SOB (shortness of breath)        Relevant Orders    EKG 12-Lead (Completed)      I spent 25 minutes with the patient and more than 50% of the time was spent in consultation regarding the above.     Einar Pheasant, MD

## 2015-09-29 NOTE — Assessment & Plan Note (Signed)
Saline nasal spray and nasacort nasal spray as outlined.  Follow.  No chest congestion.

## 2015-09-29 NOTE — Telephone Encounter (Signed)
Pt states that she received a call from you and she will be able to make the appointment that was setup for her tomorrow.

## 2015-10-01 ENCOUNTER — Encounter: Payer: Self-pay | Admitting: Internal Medicine

## 2015-10-05 ENCOUNTER — Encounter: Payer: Self-pay | Admitting: Internal Medicine

## 2015-10-05 DIAGNOSIS — I429 Cardiomyopathy, unspecified: Secondary | ICD-10-CM

## 2015-10-05 DIAGNOSIS — I471 Supraventricular tachycardia: Secondary | ICD-10-CM

## 2015-10-05 DIAGNOSIS — R0602 Shortness of breath: Secondary | ICD-10-CM

## 2015-10-05 NOTE — Telephone Encounter (Signed)
Please advise, thanks.

## 2015-10-06 NOTE — Telephone Encounter (Signed)
Order placed for referral to cardiology 

## 2015-10-25 ENCOUNTER — Ambulatory Visit: Payer: Managed Care, Other (non HMO) | Admitting: Family

## 2015-11-09 ENCOUNTER — Telehealth (HOSPITAL_COMMUNITY): Payer: Self-pay

## 2015-11-09 NOTE — Telephone Encounter (Signed)
Patient left VM on CHF triage line asking if she needed to bring any records with her, specifically any cardiology reports. Left return message asking to bring all medications/medication list and any reports that she has with her.  [name]

## 2015-11-11 ENCOUNTER — Encounter (HOSPITAL_COMMUNITY): Payer: Self-pay

## 2015-11-11 ENCOUNTER — Telehealth (HOSPITAL_COMMUNITY): Payer: Self-pay | Admitting: Vascular Surgery

## 2015-11-11 ENCOUNTER — Ambulatory Visit (HOSPITAL_COMMUNITY)
Admission: RE | Admit: 2015-11-11 | Discharge: 2015-11-11 | Disposition: A | Discharge status: Home / Self Care | Payer: Managed Care, Other (non HMO) | Source: Ambulatory Visit | Attending: Internal Medicine | Admitting: Internal Medicine

## 2015-11-11 ENCOUNTER — Encounter (HOSPITAL_COMMUNITY): Payer: Self-pay | Admitting: Internal Medicine

## 2015-11-11 VITALS — BP 120/71 | HR 96 | Resp 18 | Wt 163.5 lb

## 2015-11-11 DIAGNOSIS — I5022 Chronic systolic (congestive) heart failure: Secondary | ICD-10-CM | POA: Diagnosis not present

## 2015-11-11 DIAGNOSIS — Z79899 Other long term (current) drug therapy: Secondary | ICD-10-CM | POA: Insufficient documentation

## 2015-11-11 DIAGNOSIS — Z833 Family history of diabetes mellitus: Secondary | ICD-10-CM | POA: Insufficient documentation

## 2015-11-11 DIAGNOSIS — I471 Supraventricular tachycardia: Secondary | ICD-10-CM | POA: Diagnosis not present

## 2015-11-11 DIAGNOSIS — Z8249 Family history of ischemic heart disease and other diseases of the circulatory system: Secondary | ICD-10-CM | POA: Diagnosis not present

## 2015-11-11 DIAGNOSIS — I5021 Acute systolic (congestive) heart failure: Secondary | ICD-10-CM | POA: Diagnosis not present

## 2015-11-11 DIAGNOSIS — E785 Hyperlipidemia, unspecified: Secondary | ICD-10-CM | POA: Insufficient documentation

## 2015-11-11 DIAGNOSIS — Z823 Family history of stroke: Secondary | ICD-10-CM | POA: Diagnosis not present

## 2015-11-11 DIAGNOSIS — I110 Hypertensive heart disease with heart failure: Secondary | ICD-10-CM | POA: Diagnosis not present

## 2015-11-11 DIAGNOSIS — Z7982 Long term (current) use of aspirin: Secondary | ICD-10-CM | POA: Diagnosis not present

## 2015-11-11 LAB — BASIC METABOLIC PANEL
ANION GAP: 11 (ref 5–15)
BUN: 19 mg/dL (ref 6–20)
CO2: 25 mmol/L (ref 22–32)
Calcium: 9.5 mg/dL (ref 8.9–10.3)
Chloride: 104 mmol/L (ref 101–111)
Creatinine, Ser: 1.13 mg/dL — ABNORMAL HIGH (ref 0.44–1.00)
GFR calc Af Amer: 59 mL/min — ABNORMAL LOW (ref >60)
GFR, EST NON AFRICAN AMERICAN: 51 mL/min — AB (ref >60)
Glucose, Bld: 95 mg/dL (ref 65–99)
Potassium: 4.2 mmol/L (ref 3.5–5.1)
SODIUM: 140 mmol/L (ref 135–145)

## 2015-11-11 MED ORDER — CARVEDILOL 6.25 MG PO TABS: 9.3750 mg | ORAL_TABLET | Freq: Two times a day (BID) | ORAL | Status: DC

## 2015-11-11 MED ORDER — SPIRONOLACTONE 25 MG PO TABS: 12.5000 mg | ORAL_TABLET | Freq: Every day | ORAL | Status: DC

## 2015-11-11 NOTE — Progress Notes (Signed)
[name], female   DOB: 10-Apr-1951, 65 y.o.   MRN: [id number]   ADVANCED HF CLINIC CONSULT NOTE  Referring Physician: Dr.Scott Primary Care: Einar Pheasant Primary Cardiologist: Paraschos  HPI:  [name] is a 65 y/o woman with h/o obesity, migraines and HTN referred by Dr. Nicki Reaper for further evaluation of CHF.  Started feeling short of breath in October 2016. Thought she had bronchitis.   In January 2017 admitted at [facility] with acute CHF and SVT at 150. (No palpitations at the time) Received adenosine but didn't break. (slowed down and ECG looked like possible atrial tach). Eventually converted spontaneously. Echo EF 35-40%. TSH was low at 0.18 but FT4 and FT3 were fine. Felt to possible have hyperthyroidism. Seen by endocrine who started tapazole and a b-blocker in house. However, had f/u with Endo and they stopped the tapazole saying her thyroid wasn't the problem  On 10/11/15 had Myoview EF 30% No ischemia or scar.   Has never smoked. Rare ETOH. No family H/o SCD. Multiple family members with CAD. Says she is feeling better on HF meds. Can go to store and walk around without problem. Does get SOB of with steps. Taking lasix 20 mg daily. No orthopnea or PND. Husband says she snores mildly. Occasional CP at night.    Review of Systems: [y] = yes, [ ]  = no   General: Weight gain [ ] ; Weight loss [ ] ; Anorexia [ ] ; Fatigue [ y]; Fever [ ] ; Chills [ ] ; Weakness [ ]   Cardiac: Chest pain/pressure [ ] ; Resting SOB [ ] ; Exertional SOB Blue.Reese ]; Orthopnea [ ] ; Pedal Edema [ ] ; Palpitations [ ] ; Syncope [ ] ; Presyncope [ ] ; Paroxysmal nocturnal dyspnea[ ]   Pulmonary: Cough [ ] ; Wheezing[ ] ; Hemoptysis[ ] ; Sputum [ ] ; Snoring [ ]   GI: Vomiting[ ] ; Dysphagia[ ] ; Melena[ ] ; Hematochezia [ ] ; Heartburn[ ] ; Abdominal pain [ ] ; Constipation [ ] ; Diarrhea [ ] ; BRBPR [ ]   GU: Hematuria[ ] ; Dysuria [ ] ; Nocturia[ ]   Vascular: Pain in legs with walking [ ] ; Pain in feet with lying  flat [ ] ; Non-healing sores [ ] ; Stroke [ ] ; TIA [ ] ; Slurred speech [ ] ;  Neuro: Headaches[ ] ; Vertigo[ ] ; Seizures[ ] ; Paresthesias[ ] ;Blurred vision [ ] ; Diplopia [ ] ; Vision changes [ ]   Ortho/Skin: Arthritis Blue.Reese ]; Joint pain [ ] ; Muscle pain [ ] ; Joint swelling [ ] ; Back Pain [ ] ; Rash [ ]   Psych: Depression[ ] ; Anxiety[ ]   Heme: Bleeding problems [ ] ; Clotting disorders [ ] ; Anemia [ ]   Endocrine: Diabetes [ ] ; Thyroid dysfunction[y ]   Past Medical History  Diagnosis Date  . Hyperlipidemia   . Migraines   . Hypertension   . Allergy   . Personal history of colonic polyps   . Cancer ([place])     basal cell  . Congestive heart failure [facility])     Current Outpatient Prescriptions  Medication Sig Dispense Refill  . aspirin 81 MG tablet Take 81 mg by mouth daily.    . calcium carbonate (OS-CAL) 600 MG TABS Take 600 mg by mouth at bedtime.     . carvedilol (COREG) 6.25 MG tablet Take 1 tablet (6.25 mg total) by mouth 2 (two) times daily with a meal. 60 tablet 1  . cholecalciferol (VITAMIN D) 1000 UNITS tablet Take 2,000 Units by mouth at bedtime.     . fexofenadine (ALLEGRA) 180 MG tablet Take 180 mg by mouth at bedtime.     Marland Kitchen  furosemide (LASIX) 20 MG tablet Take 1 tablet (20 mg total) by mouth daily. 60 tablet 1  . lisinopril (PRINIVIL,ZESTRIL) 5 MG tablet Take 5 mg by mouth at bedtime. Reported on 11/11/2015    . Olopatadine HCl (PAZEO) 0.7 % SOLN Apply to eye.    Marland Kitchen omeprazole (PRILOSEC) 20 MG capsule Take 1 capsule (20 mg total) by mouth 2 (two) times daily before a meal. 60 capsule 3  . simvastatin (ZOCOR) 10 MG tablet Take 10 mg by mouth at bedtime.    . SUMAtriptan (IMITREX) 100 MG tablet Take 100 mg by mouth every 2 (two) hours as needed for migraine. May repeat in 2 hours if headache persists or recurs.    Marland Kitchen zonisamide (ZONEGRAN) 100 MG capsule Take 1 capsule (100 mg total) by mouth at bedtime. 30 capsule 1   No current facility-administered medications for this encounter.     No Known Allergies    Social History   Social History  . Marital Status: Married    Spouse Name: N/A  . Number of Children: 2  . Years of Education: N/A   Occupational History  . Not on file.   Social History Main Topics  . Smoking status: Never Smoker   . Smokeless tobacco: Never Used  . Alcohol Use: 0.0 oz/week    0 Standard drinks or equivalent per week     Comment: Socially  . Drug Use: No  . Sexual Activity: Not on file   Other Topics Concern  . Not on file   Social History Narrative      Family History  Problem Relation Age of Onset  . Hypertension Mother   . Stroke Mother   . Hypercholesterolemia Father   . Diabetes Mother   . Heart disease Father     MI  . Hypercholesterolemia Father   . Hypertension Father   . Stroke Father   . Breast cancer Neg Hx   . Colon cancer Neg Hx     Filed Vitals:   11/11/15 1513  BP: 120/71  Pulse: 96  Resp: 18  Weight: 163 lb 8 oz (74.163 kg)  SpO2: 96%    PHYSICAL EXAM: General:  Well appearing. No respiratory difficulty HEENT: normal Neck: supple. no JVD. Carotids 2+ bilat; no bruits. No lymphadenopathy or thryomegaly appreciated. Cor: PMI nondisplaced. Regular rate & rhythm. No rubs, gallops or murmurs. Lungs: clear Abdomen: soft, nontender, nondistended. No hepatosplenomegaly. No bruits or masses. Good bowel sounds. Extremities: no cyanosis, clubbing, rash, edema Neuro: alert & oriented x 3, cranial nerves grossly intact. moves all 4 extremities w/o difficulty. Affect pleasant.  ASSESSMENT & PLAN: 1.Acute systolic HF - EF 123456 echo 1/17. myoview 2/17 EF 30% no ischemia/infarct. Possibly tachy-induced --I suspect she has a tachy-induced CM but does have RF for CAD. We discussed role of cath to further evaluate. Will proceed with R/L cath --Place event monitor to look for burden of SVT. May need EP to see for ablation --Add spiro 12.5 --Increase carvedilol to 9.375 bid --Continue lisinopril with goal  to eventually switch to [facility] --Check BMET --May need sleep study at some point though symptoms not overly convincing for severe OSA 2. SVT --as above. Will place event monitor. Increase carvedilol  Total time spent 45 minutes. Over half that time spent discussing above.   Charelle Petrakis,MD 10:22 PM

## 2015-11-11 NOTE — Patient Instructions (Signed)
INCREASE Carvedilol (Coreg) to 9.375 mg (1.5 tabs) twice daily.  START Spironolactone 12.5 mg (1/2 tab) once daily.  Routine lab work today. Will notify you of abnormal results, otherwise no news is good news!  Return in 1 week for labs.  Will schedule you for a 30 day event monitor at [facility]. Address: [address], [place], [place] [address]  3rd floor Phone: [phone]  You have been scheduled for a heart cath on 11/26/2015. See instruction sheet for additional details.  Follow up 3 weeks with Dr. Haroldine Laws.

## 2015-11-11 NOTE — Telephone Encounter (Signed)
Left pt message to change appt 

## 2015-11-12 ENCOUNTER — Encounter: Payer: Self-pay | Admitting: Internal Medicine

## 2015-11-14 ENCOUNTER — Inpatient Hospital Stay
Admission: EM | Admit: 2015-11-14 | Discharge: 2015-11-18 | DRG: 871 | Disposition: A | Discharge status: Home / Self Care | Payer: Managed Care, Other (non HMO) | Attending: Internal Medicine | Admitting: Internal Medicine

## 2015-11-14 ENCOUNTER — Emergency Department: Payer: Managed Care, Other (non HMO)

## 2015-11-14 ENCOUNTER — Encounter: Payer: Self-pay | Admitting: Emergency Medicine

## 2015-11-14 DIAGNOSIS — R55 Syncope and collapse: Secondary | ICD-10-CM | POA: Diagnosis present

## 2015-11-14 DIAGNOSIS — I4891 Unspecified atrial fibrillation: Secondary | ICD-10-CM | POA: Diagnosis present

## 2015-11-14 DIAGNOSIS — I4729 Other ventricular tachycardia: Secondary | ICD-10-CM | POA: Insufficient documentation

## 2015-11-14 DIAGNOSIS — J9801 Acute bronchospasm: Secondary | ICD-10-CM | POA: Diagnosis present

## 2015-11-14 DIAGNOSIS — N179 Acute kidney failure, unspecified: Secondary | ICD-10-CM | POA: Diagnosis present

## 2015-11-14 DIAGNOSIS — Z86010 Personal history of colonic polyps: Secondary | ICD-10-CM

## 2015-11-14 DIAGNOSIS — A419 Sepsis, unspecified organism: Secondary | ICD-10-CM | POA: Diagnosis not present

## 2015-11-14 DIAGNOSIS — K5790 Diverticulosis of intestine, part unspecified, without perforation or abscess without bleeding: Secondary | ICD-10-CM | POA: Diagnosis present

## 2015-11-14 DIAGNOSIS — Z8249 Family history of ischemic heart disease and other diseases of the circulatory system: Secondary | ICD-10-CM

## 2015-11-14 DIAGNOSIS — E869 Volume depletion, unspecified: Secondary | ICD-10-CM | POA: Diagnosis present

## 2015-11-14 DIAGNOSIS — R6521 Severe sepsis with septic shock: Secondary | ICD-10-CM | POA: Diagnosis present

## 2015-11-14 DIAGNOSIS — Z833 Family history of diabetes mellitus: Secondary | ICD-10-CM

## 2015-11-14 DIAGNOSIS — Z7982 Long term (current) use of aspirin: Secondary | ICD-10-CM

## 2015-11-14 DIAGNOSIS — A084 Viral intestinal infection, unspecified: Secondary | ICD-10-CM | POA: Diagnosis present

## 2015-11-14 DIAGNOSIS — I420 Dilated cardiomyopathy: Secondary | ICD-10-CM | POA: Insufficient documentation

## 2015-11-14 DIAGNOSIS — I959 Hypotension, unspecified: Secondary | ICD-10-CM | POA: Diagnosis present

## 2015-11-14 DIAGNOSIS — I110 Hypertensive heart disease with heart failure: Secondary | ICD-10-CM | POA: Diagnosis present

## 2015-11-14 DIAGNOSIS — Z823 Family history of stroke: Secondary | ICD-10-CM

## 2015-11-14 DIAGNOSIS — J111 Influenza due to unidentified influenza virus with other respiratory manifestations: Secondary | ICD-10-CM | POA: Insufficient documentation

## 2015-11-14 DIAGNOSIS — Z8349 Family history of other endocrine, nutritional and metabolic diseases: Secondary | ICD-10-CM

## 2015-11-14 DIAGNOSIS — Z79899 Other long term (current) drug therapy: Secondary | ICD-10-CM

## 2015-11-14 DIAGNOSIS — Z6829 Body mass index (BMI) 29.0-29.9, adult: Secondary | ICD-10-CM

## 2015-11-14 DIAGNOSIS — Z85828 Personal history of other malignant neoplasm of skin: Secondary | ICD-10-CM

## 2015-11-14 DIAGNOSIS — N289 Disorder of kidney and ureter, unspecified: Secondary | ICD-10-CM

## 2015-11-14 DIAGNOSIS — K529 Noninfective gastroenteritis and colitis, unspecified: Secondary | ICD-10-CM | POA: Diagnosis present

## 2015-11-14 DIAGNOSIS — I472 Ventricular tachycardia: Secondary | ICD-10-CM | POA: Diagnosis present

## 2015-11-14 DIAGNOSIS — N390 Urinary tract infection, site not specified: Secondary | ICD-10-CM | POA: Diagnosis present

## 2015-11-14 DIAGNOSIS — J101 Influenza due to other identified influenza virus with other respiratory manifestations: Secondary | ICD-10-CM | POA: Diagnosis present

## 2015-11-14 DIAGNOSIS — I5022 Chronic systolic (congestive) heart failure: Secondary | ICD-10-CM | POA: Diagnosis present

## 2015-11-14 DIAGNOSIS — E785 Hyperlipidemia, unspecified: Secondary | ICD-10-CM | POA: Diagnosis present

## 2015-11-14 DIAGNOSIS — I471 Supraventricular tachycardia: Secondary | ICD-10-CM | POA: Diagnosis present

## 2015-11-14 DIAGNOSIS — E669 Obesity, unspecified: Secondary | ICD-10-CM | POA: Diagnosis present

## 2015-11-14 DIAGNOSIS — B370 Candidal stomatitis: Secondary | ICD-10-CM | POA: Diagnosis present

## 2015-11-14 HISTORY — DX: Severe sepsis with septic shock: R65.21

## 2015-11-14 HISTORY — DX: Sepsis, unspecified organism: A41.9

## 2015-11-14 LAB — CBC
HEMATOCRIT: 43.4 % (ref 35.0–47.0)
HEMOGLOBIN: 14.2 g/dL (ref 12.0–16.0)
MCH: 27.3 pg (ref 26.0–34.0)
MCHC: 32.7 g/dL (ref 32.0–36.0)
MCV: 83.5 fL (ref 80.0–100.0)
Platelets: 169 10*3/uL (ref 150–440)
RBC: 5.19 MIL/uL (ref 3.80–5.20)
RDW: 14.7 % — ABNORMAL HIGH (ref 11.5–14.5)
WBC: 11.3 10*3/uL — AB (ref 3.6–11.0)

## 2015-11-14 LAB — TROPONIN I

## 2015-11-14 LAB — COMPREHENSIVE METABOLIC PANEL
ALT: 21 U/L (ref 14–54)
AST: 29 U/L (ref 15–41)
Albumin: 3.8 g/dL (ref 3.5–5.0)
Alkaline Phosphatase: 90 U/L (ref 38–126)
Anion gap: 6 (ref 5–15)
BUN: 26 mg/dL — ABNORMAL HIGH (ref 6–20)
CHLORIDE: 108 mmol/L (ref 101–111)
CO2: 22 mmol/L (ref 22–32)
Calcium: 8.6 mg/dL — ABNORMAL LOW (ref 8.9–10.3)
Creatinine, Ser: 1.71 mg/dL — ABNORMAL HIGH (ref 0.44–1.00)
GFR, EST AFRICAN AMERICAN: 36 mL/min — AB (ref >60)
GFR, EST NON AFRICAN AMERICAN: 31 mL/min — AB (ref >60)
Glucose, Bld: 125 mg/dL — ABNORMAL HIGH (ref 65–99)
POTASSIUM: 4 mmol/L (ref 3.5–5.1)
SODIUM: 136 mmol/L (ref 135–145)
Total Bilirubin: 0.6 mg/dL (ref 0.3–1.2)
Total Protein: 7 g/dL (ref 6.5–8.1)

## 2015-11-14 LAB — LACTIC ACID, PLASMA: Lactic Acid, Venous: 0.8 mmol/L (ref 0.5–2.0)

## 2015-11-14 LAB — RAPID INFLUENZA A&B ANTIGENS (ARMC ONLY): INFLUENZA B (ARMC): POSITIVE — AB

## 2015-11-14 LAB — RAPID INFLUENZA A&B ANTIGENS: Influenza A (ARMC): NEGATIVE

## 2015-11-14 LAB — LIPASE, BLOOD: LIPASE: 24 U/L (ref 11–51)

## 2015-11-14 MED ORDER — OSELTAMIVIR PHOSPHATE 75 MG PO CAPS
75.0000 mg | ORAL_CAPSULE | ORAL | Status: AC
  Administered 2015-11-14: 75 mg via ORAL
  Filled 2015-11-14: qty 1

## 2015-11-14 MED ORDER — IOHEXOL 240 MG/ML SOLN: 25.0000 mL | INTRAMUSCULAR | Status: DC

## 2015-11-14 MED ORDER — PIPERACILLIN-TAZOBACTAM 3.375 G IVPB 30 MIN
3.3750 g | Freq: Once | INTRAVENOUS | Status: AC
  Administered 2015-11-14: 3.375 g via INTRAVENOUS
  Filled 2015-11-14: qty 50

## 2015-11-14 MED ORDER — SODIUM CHLORIDE 0.9 % IV BOLUS (SEPSIS)
500.0000 mL | Freq: Once | INTRAVENOUS | Status: AC
  Administered 2015-11-14: 500 mL via INTRAVENOUS

## 2015-11-14 MED ORDER — ACETAMINOPHEN 500 MG PO TABS
1000.0000 mg | ORAL_TABLET | ORAL | Status: AC
  Administered 2015-11-14: 1000 mg via ORAL
  Filled 2015-11-14: qty 2

## 2015-11-14 MED ORDER — SODIUM CHLORIDE 0.9 % IV BOLUS (SEPSIS)
1000.0000 mL | Freq: Once | INTRAVENOUS | Status: AC
Start: 2015-11-14 — End: 2015-11-15
  Administered 2015-11-14: 1000 mL via INTRAVENOUS

## 2015-11-14 MED ORDER — SODIUM CHLORIDE 0.9 % IV BOLUS (SEPSIS)
1000.0000 mL | Freq: Once | INTRAVENOUS | Status: DC
Start: 2015-11-14 — End: 2015-11-18

## 2015-11-14 MED ORDER — VANCOMYCIN HCL IN DEXTROSE 1-5 GM/200ML-% IV SOLN
1000.0000 mg | Freq: Once | INTRAVENOUS | Status: AC
  Administered 2015-11-14: 1000 mg via INTRAVENOUS
  Filled 2015-11-14: qty 200

## 2015-11-14 MED ORDER — HYDROCORTISONE NA SUCCINATE PF 100 MG IJ SOLR
100.0000 mg | Freq: Once | INTRAMUSCULAR | Status: AC
  Administered 2015-11-14: 100 mg via INTRAVENOUS
  Filled 2015-11-14: qty 2

## 2015-11-14 MED ORDER — SODIUM CHLORIDE 0.9 % IV BOLUS (SEPSIS)
1000.0000 mL | Freq: Once | INTRAVENOUS | Status: AC
  Administered 2015-11-14: 1000 mL via INTRAVENOUS

## 2015-11-14 NOTE — ED Provider Notes (Addendum)
[facility] Emergency Department Provider Note ____________________________________________  Time seen: Approximately 10:33 PM  I have reviewed the triage vital signs and the nursing notes.   HISTORY  Chief Complaint Fever  EM caveat: Acuity limits due to severe hypotension  HPI Kristi Greer is a 65 y.o. female presents for evaluation of nausea, vomiting, runny nose and fever. The patient reports she's been having fevers at home, she's had diarrhea 4 times today which is nonbloody. Her blood pressure is been low at home, she had it checked twice and prompted evaluation from the ER. She also reports that she did vomit a couple times nonbloody today.  She denies chest pain or shortness of breath. She is having some mild pain in the lower abdomen associated with loose stool. No history of travel.   Past Medical History  Diagnosis Date  . Hyperlipidemia   . Migraines   . Hypertension   . Allergy   . Personal history of colonic polyps   . Cancer ([place])     basal cell  . Congestive heart failure [facility])     Patient Active Problem List   Diagnosis Date Noted  . Environmental allergies 09/29/2015  . Chronic systolic heart failure ([place]) 09/22/2015  . Hypotension 09/22/2015  . Abdominal pain 09/07/2015  . SVT (supraventricular tachycardia) ([place]) 09/01/2015  . Health care maintenance 04/13/2015  . Abnormal mammogram 10/11/2014  . History of migraine headaches 11/11/2012  . Hypercholesterolemia 11/11/2012  . History of colonic polyps 11/11/2012    Past Surgical History  Procedure Laterality Date  . Tubal ligation    . Finger surgery    . Cholecystectomy  2006  . Vaginal hysterectomy      midurethral sling  . Hysteroscopy  2011, 10/2010    Current Outpatient Rx  Name  Route  Sig  Dispense  Refill  . aspirin 81 MG tablet   Oral   Take 81 mg by mouth daily.         . calcium carbonate (OS-CAL) 600 MG TABS   Oral   Take 600 mg by mouth at  bedtime.          . carvedilol (COREG) 6.25 MG tablet   Oral   Take 1.5 tablets (9.375 mg total) by mouth 2 (two) times daily with a meal.   90 tablet   6   . cholecalciferol (VITAMIN D) 1000 UNITS tablet   Oral   Take 2,000 Units by mouth at bedtime.          . fexofenadine (ALLEGRA) 180 MG tablet   Oral   Take 180 mg by mouth at bedtime.          . furosemide (LASIX) 20 MG tablet   Oral   Take 1 tablet (20 mg total) by mouth daily.   60 tablet   1   . hydroxypropyl methylcellulose / hypromellose (ISOPTO TEARS / GONIOVISC) 2.5 % ophthalmic solution   Both Eyes   Place 1 drop into both eyes 2 (two) times daily.         Marland Kitchen lisinopril (PRINIVIL,ZESTRIL) 5 MG tablet   Oral   Take 5 mg by mouth at bedtime. Reported on 11/11/2015         . Olopatadine HCl (PAZEO) 0.7 % SOLN   Both Eyes   Place 1 drop into both eyes every morning.          Marland Kitchen omeprazole (PRILOSEC) 20 MG capsule   Oral   Take 1  capsule (20 mg total) by mouth 2 (two) times daily before a meal.   60 capsule   3   . simvastatin (ZOCOR) 10 MG tablet   Oral   Take 10 mg by mouth at bedtime.         Marland Kitchen spironolactone (ALDACTONE) 25 MG tablet   Oral   Take 0.5 tablets (12.5 mg total) by mouth daily.   45 tablet   3   . SUMAtriptan (IMITREX) 100 MG tablet   Oral   Take 100 mg by mouth every 2 (two) hours as needed for migraine. May repeat in 2 hours if headache persists or recurs.         Marland Kitchen zonisamide (ZONEGRAN) 100 MG capsule   Oral   Take 1 capsule (100 mg total) by mouth at bedtime.   30 capsule   1     PLEASE REFILL ZONISAMIDE     Allergies Review of patient's allergies indicates no known allergies.  Family History  Problem Relation Age of Onset  . Hypertension Mother   . Stroke Mother   . Hypercholesterolemia Father   . Diabetes Mother   . Heart disease Father     MI  . Hypercholesterolemia Father   . Hypertension Father   . Stroke Father   . Breast cancer Neg Hx   .  Colon cancer Neg Hx     Social History Social History  Substance Use Topics  . Smoking status: Never Smoker   . Smokeless tobacco: Never Used  . Alcohol Use: 0.0 oz/week    0 Standard drinks or equivalent per week     Comment: Socially    Review of Systems Constitutional: Fevers chills and fatigue Eyes: No visual changes. ENT: No sore throat. Cardiovascular: Denies chest pain. Respiratory: Denies shortness of breath. Gastrointestinal: No abdominal pain except for some slight discomfort in the left lower quadrant with diarrhea.  No nausea, no vomiting.  No diarrhea.  No constipation. Genitourinary: Negative for dysuria. Musculoskeletal: Negative for back pain. Skin: Negative for rash. Neurological: Negative for headaches, focal weakness or numbness.  10-point ROS otherwise negative.  ____________________________________________   PHYSICAL EXAM:  VITAL SIGNS: ED Triage Vitals  Enc Vitals Group     BP 11/14/15 2154 66/45 mmHg     Pulse Rate 11/14/15 2154 116     Resp 11/14/15 2154 20     Temp 11/14/15 2154 97.6 F (36.4 C)     Temp Source 11/14/15 2154 Oral     SpO2 11/14/15 2154 100 %     Weight 11/14/15 2154 156 lb (70.761 kg)     Height 11/14/15 2154 5\' 1"  (1.549 m)     Head Cir --      Peak Flow --      Pain Score 11/14/15 2155 7     Pain Loc --      Pain Edu? --      Excl. in [place]? --    Constitutional: Alert and oriented. Well appearing and in no acute distressThough does appear slightly fatigued. Eyes: Conjunctivae are normal. PERRL. EOMI. Head: Atraumatic. Nose: Clear rhinorrhea Mouth/Throat: Mucous membranes are very dry and questionably thrush on the tongue.  Oropharynx non-erythematous. Neck: No stridor.  No meningismus Cardiovascular: Slightly tachycardic rate, regular rhythm. Grossly normal heart sounds.  Good peripheral circulation. Respiratory: Normal respiratory effort.  No retractions. Lungs CTAB. Gastrointestinal: Soft and nontender except for  some minimal discomfort in the left lower quadrant without peritonitis. No distention.  Musculoskeletal: No  lower extremity tenderness nor edema.  No joint effusions. Neurologic:  Normal speech and language. No gross focal neurologic deficits are appreciated. Skin:  Skin is warm, dry and intact. No rash noted. Psychiatric: Mood and affect are normal. Speech and behavior are normal.  ____________________________________________   LABS (all labs ordered are listed, but only abnormal results are displayed)  Labs Reviewed  RAPID INFLUENZA A&B ANTIGENS ([facility] ONLY) - Abnormal; Notable for the following:    Influenza B ([facility]) POSITIVE (*)    All other components within normal limits  COMPREHENSIVE METABOLIC PANEL - Abnormal; Notable for the following:    Glucose, Bld 125 (*)    BUN 26 (*)    Creatinine, Ser 1.71 (*)    Calcium 8.6 (*)    GFR calc non Af Amer 31 (*)    GFR calc Af Amer 36 (*)    All other components within normal limits  CBC - Abnormal; Notable for the following:    WBC 11.3 (*)    RDW 14.7 (*)    All other components within normal limits  URINALYSIS COMPLETEWITH MICROSCOPIC ([facility] ONLY) - Abnormal; Notable for the following:    Color, Urine YELLOW (*)    APPearance CLOUDY (*)    Hgb urine dipstick 1+ (*)    Nitrite POSITIVE (*)    Leukocytes, UA 3+ (*)    Bacteria, UA MANY (*)    Squamous Epithelial / LPF 0-5 (*)    All other components within normal limits  CULTURE, BLOOD (ROUTINE X 2)  CULTURE, BLOOD (ROUTINE X 2)  C DIFFICILE QUICK SCREEN W PCR REFLEX  GASTROINTESTINAL PANEL BY PCR, STOOL (REPLACES STOOL CULTURE)  URINE CULTURE  LIPASE, BLOOD  LACTIC ACID, PLASMA  TROPONIN I  LACTIC ACID, PLASMA   ____________________________________________  EKG  Reviewed and interpreted by me at 2200 hrs. Heart rate 90 QRS 90 QTc 460 Normal sinus rhythm, no acute ischemic T-wave abnormality ____________________________________________  RADIOLOGY   CT Abdomen  Pelvis Wo Contrast (Final result) Result time: 11/14/15 23:30:58   Final result by Rad Results In Interface (11/14/15 23:30:58)   Narrative:   CLINICAL DATA: Fever, hypotension, vomiting and diarrhea. Onset yesterday.  EXAM: CT ABDOMEN AND PELVIS WITHOUT CONTRAST  TECHNIQUE: Multidetector CT imaging of the abdomen and pelvis was performed following the standard protocol without IV contrast.  COMPARISON: 10/27/2013  FINDINGS: There is cholecystectomy. There are unremarkable unenhanced appearances of the liver and bile ducts. There are unremarkable unenhanced appearances of the spleen, pancreas, adrenals and kidneys. Parapelvic cysts are incidentally noted about both kidneys, unchanged from 10/27/2013. Ureters and urinary bladder are unremarkable.  There is a small hiatal hernia. Stomach is otherwise unremarkable. Small bowel is unremarkable. Appendix is normal. Colon is remarkable only for minimal uncomplicated diverticulosis.  The abdominal aorta is normal in caliber. There is no atherosclerotic calcification. There is no adenopathy in the abdomen or pelvis.  No acute inflammatory changes are evident in the abdomen or pelvis. There is no adenopathy. There is no ascites.  There is prior hysterectomy. No adnexal abnormalities are evident. No significant abnormalities evident in the lower chest. There is mild unchanged linear basilar scarring. No significant skeletal lesion is evident. Moderate degenerative lumbar disc disease is present, greatest at L2-3.  IMPRESSION: 1. No acute findings are evident in the abdomen or pelvis. 2. Small hiatal hernia. 3. Diverticulosis.   Electronically Signed By: Andreas Newport M.D. On: 11/14/2015 23:30          DG Chest  Portable 1 View (Final result) Result time: 11/14/15 23:14:13   Final result by Rad Results In Interface (11/14/15 23:14:13)   Narrative:   CLINICAL DATA: Fever and hypotension, onset last  night.  EXAM: PORTABLE CHEST 1 VIEW  COMPARISON: 08/31/2015  FINDINGS: There is mild unchanged cardiomegaly. The lungs are clear. There is no pleural effusion. The pulmonary vasculature is normal.  IMPRESSION: Cardiomegaly. No acute findings.   Electronically Signed By: Andreas Newport M.D. On: 11/14/2015 23:14       ____________________________________________   PROCEDURES  Procedure(s) performed: None  Critical Care performed: Yes, see critical care note(s)  CRITICAL CARE Performed by: Delman Kitten   Total critical care time: 80 minutes  Critical care time was exclusive of separately billable procedures and treating other patients.  Critical care was necessary to treat or prevent imminent or life-threatening deterioration.  Critical care was time spent personally by me on the following activities: development of treatment plan with patient and/or surrogate as well as nursing, discussions with consultants, evaluation of patient's response to treatment, examination of patient, obtaining history from patient or surrogate, ordering and performing treatments and interventions, ordering and review of laboratory studies, ordering and review of radiographic studies, pulse oximetry and re-evaluation of patient's condition.  ED Sepsis - Repeat Assessment   Performed at:    2355  Last Vitals:    Blood pressure 85/57, pulse 98, temperature 98.1 F (36.7 C), temperature source Oral, resp. rate 18, height 5\' 1"  (1.549 m), weight 156 lb (70.761 kg), last menstrual period 10/18/2012, SpO2 99 %.  Heart:      Regular, 90. Normal tones  Lungs:     Remained clear after 3 L  Capillary Refill:   Normal  Peripheral Pulse (include location): Left radial, normal   Skin (include color):   Slightly pale, warm  ____________________________________________   INITIAL IMPRESSION / ASSESSMENT AND PLAN / ED COURSE  Pertinent labs & imaging results that were available during  my care of the patient were reviewed by me and considered in my medical decision making (see chart for details).  Patient presents for fatigue, fevers, nausea, loose stool associated with a runny nose and cough for the last 2-3 days. She does carry a history of congestive heart failure etiology still slightly unclear. She appears fatigued, and notably hypotensive. Severe.  Signs and symptoms seem to suggest infectious etiology, seemed to have started with viral type symptoms including runny nose, cough, fever progressing to diarrhea described as nonbloody and vomiting today. She does appear hypovolemic.  EKG is not demonstrated acute cardiac abnormality. The patient will be aggressively resuscitated, based on symptomatology code sepsis as patient early antibiotics initiated.  Patient's influenza test returned positive, appears likely source, though urinalysis is pending.  ----------------------------------------- 12:13 AM on 11/15/2015 -----------------------------------------  Patient appears to be responding to fluid boluses now, likely prerenal dehydration on top of influenza and severe sepsis. Blood pressure now improving 85/57. I did discuss risks benefits and reasons for central line insertion with the patient and her family, at this point she is awake mentating well and she would like to continue with additional fluids and does not wish for central line placement at this exact moment. I think is reasonable, she does not have L a lactate, she is mentating well, and she is trending positively with improving blood pressures at this time. Continue fluid resuscitation.  Case and care discussed with the hospitalist service, planning to admit to the ICU for septic shock. ____________________________________________  FINAL CLINICAL IMPRESSION(S) / ED DIAGNOSES  Final diagnoses:  Septic shock ([place])  Sepsis, due to unspecified organism ([place])  Influenza  Acute renal insufficiency  Thrush   Acute urinary tract infection      Delman Kitten, MD 11/15/15 [id number]  Delman Kitten, MD 11/15/15 [id number]

## 2015-11-14 NOTE — ED Notes (Signed)
MD Quale made aware of recent BP decline. New orders given and will complete accordingly.

## 2015-11-14 NOTE — ED Notes (Signed)
Pt states fever, low blood pressure, vomiting, diarrhea that began last pm. Pt with blood pressure 72/58 at home with temp of 101.8. Pt states took advil 800mg  approx 2 hours ago. Pt states felt "like i was going to pass out" at home.

## 2015-11-15 ENCOUNTER — Other Ambulatory Visit: Payer: Self-pay

## 2015-11-15 DIAGNOSIS — R55 Syncope and collapse: Secondary | ICD-10-CM | POA: Diagnosis present

## 2015-11-15 DIAGNOSIS — K529 Noninfective gastroenteritis and colitis, unspecified: Secondary | ICD-10-CM | POA: Diagnosis present

## 2015-11-15 DIAGNOSIS — I959 Hypotension, unspecified: Secondary | ICD-10-CM | POA: Diagnosis present

## 2015-11-15 DIAGNOSIS — Z6829 Body mass index (BMI) 29.0-29.9, adult: Secondary | ICD-10-CM | POA: Diagnosis not present

## 2015-11-15 DIAGNOSIS — I471 Supraventricular tachycardia: Secondary | ICD-10-CM | POA: Diagnosis present

## 2015-11-15 DIAGNOSIS — I499 Cardiac arrhythmia, unspecified: Secondary | ICD-10-CM | POA: Diagnosis not present

## 2015-11-15 DIAGNOSIS — J111 Influenza due to unidentified influenza virus with other respiratory manifestations: Secondary | ICD-10-CM | POA: Diagnosis not present

## 2015-11-15 DIAGNOSIS — J101 Influenza due to other identified influenza virus with other respiratory manifestations: Secondary | ICD-10-CM | POA: Diagnosis present

## 2015-11-15 DIAGNOSIS — N179 Acute kidney failure, unspecified: Secondary | ICD-10-CM | POA: Diagnosis present

## 2015-11-15 DIAGNOSIS — N390 Urinary tract infection, site not specified: Secondary | ICD-10-CM | POA: Diagnosis present

## 2015-11-15 DIAGNOSIS — A419 Sepsis, unspecified organism: Secondary | ICD-10-CM | POA: Diagnosis present

## 2015-11-15 DIAGNOSIS — I110 Hypertensive heart disease with heart failure: Secondary | ICD-10-CM | POA: Diagnosis present

## 2015-11-15 DIAGNOSIS — R6521 Severe sepsis with septic shock: Secondary | ICD-10-CM | POA: Diagnosis present

## 2015-11-15 DIAGNOSIS — E669 Obesity, unspecified: Secondary | ICD-10-CM | POA: Diagnosis present

## 2015-11-15 DIAGNOSIS — Z8349 Family history of other endocrine, nutritional and metabolic diseases: Secondary | ICD-10-CM | POA: Diagnosis not present

## 2015-11-15 DIAGNOSIS — Z8249 Family history of ischemic heart disease and other diseases of the circulatory system: Secondary | ICD-10-CM | POA: Diagnosis not present

## 2015-11-15 DIAGNOSIS — I4891 Unspecified atrial fibrillation: Secondary | ICD-10-CM | POA: Diagnosis present

## 2015-11-15 DIAGNOSIS — B370 Candidal stomatitis: Secondary | ICD-10-CM | POA: Diagnosis present

## 2015-11-15 DIAGNOSIS — I472 Ventricular tachycardia: Secondary | ICD-10-CM | POA: Diagnosis present

## 2015-11-15 DIAGNOSIS — K5790 Diverticulosis of intestine, part unspecified, without perforation or abscess without bleeding: Secondary | ICD-10-CM | POA: Diagnosis present

## 2015-11-15 DIAGNOSIS — J9801 Acute bronchospasm: Secondary | ICD-10-CM | POA: Diagnosis present

## 2015-11-15 DIAGNOSIS — Z833 Family history of diabetes mellitus: Secondary | ICD-10-CM | POA: Diagnosis not present

## 2015-11-15 DIAGNOSIS — I420 Dilated cardiomyopathy: Secondary | ICD-10-CM | POA: Diagnosis not present

## 2015-11-15 DIAGNOSIS — I5022 Chronic systolic (congestive) heart failure: Secondary | ICD-10-CM | POA: Diagnosis present

## 2015-11-15 DIAGNOSIS — Z79899 Other long term (current) drug therapy: Secondary | ICD-10-CM | POA: Diagnosis not present

## 2015-11-15 DIAGNOSIS — A084 Viral intestinal infection, unspecified: Secondary | ICD-10-CM | POA: Diagnosis present

## 2015-11-15 DIAGNOSIS — E869 Volume depletion, unspecified: Secondary | ICD-10-CM | POA: Diagnosis present

## 2015-11-15 DIAGNOSIS — Z823 Family history of stroke: Secondary | ICD-10-CM | POA: Diagnosis not present

## 2015-11-15 DIAGNOSIS — E785 Hyperlipidemia, unspecified: Secondary | ICD-10-CM | POA: Diagnosis present

## 2015-11-15 DIAGNOSIS — I951 Orthostatic hypotension: Secondary | ICD-10-CM | POA: Diagnosis not present

## 2015-11-15 DIAGNOSIS — Z85828 Personal history of other malignant neoplasm of skin: Secondary | ICD-10-CM | POA: Diagnosis not present

## 2015-11-15 DIAGNOSIS — Z86010 Personal history of colonic polyps: Secondary | ICD-10-CM | POA: Diagnosis not present

## 2015-11-15 DIAGNOSIS — Z7982 Long term (current) use of aspirin: Secondary | ICD-10-CM | POA: Diagnosis not present

## 2015-11-15 LAB — BASIC METABOLIC PANEL
ANION GAP: 2 — AB (ref 5–15)
BUN: 19 mg/dL (ref 6–20)
CHLORIDE: 119 mmol/L — AB (ref 101–111)
CO2: 18 mmol/L — ABNORMAL LOW (ref 22–32)
Calcium: 7.3 mg/dL — ABNORMAL LOW (ref 8.9–10.3)
Creatinine, Ser: 1.06 mg/dL — ABNORMAL HIGH (ref 0.44–1.00)
GFR, EST NON AFRICAN AMERICAN: 55 mL/min — AB (ref >60)
Glucose, Bld: 129 mg/dL — ABNORMAL HIGH (ref 65–99)
POTASSIUM: 3.9 mmol/L (ref 3.5–5.1)
SODIUM: 139 mmol/L (ref 135–145)

## 2015-11-15 LAB — GASTROINTESTINAL PANEL BY PCR, STOOL (REPLACES STOOL CULTURE)

## 2015-11-15 LAB — CBC
HEMATOCRIT: 36.7 % (ref 35.0–47.0)
HEMOGLOBIN: 12 g/dL (ref 12.0–16.0)
MCH: 27.8 pg (ref 26.0–34.0)
MCHC: 32.6 g/dL (ref 32.0–36.0)
MCV: 85.3 fL (ref 80.0–100.0)
Platelets: 124 10*3/uL — ABNORMAL LOW (ref 150–440)
RBC: 4.31 MIL/uL (ref 3.80–5.20)
RDW: 14.5 % (ref 11.5–14.5)
WBC: 8.9 10*3/uL (ref 3.6–11.0)

## 2015-11-15 LAB — URINALYSIS COMPLETE WITH MICROSCOPIC (ARMC ONLY)
Bilirubin Urine: NEGATIVE
Glucose, UA: NEGATIVE mg/dL
Ketones, ur: NEGATIVE mg/dL
Nitrite: POSITIVE — AB
PH: 5 (ref 5.0–8.0)
PROTEIN: NEGATIVE mg/dL
Specific Gravity, Urine: 1.012 (ref 1.005–1.030)

## 2015-11-15 LAB — C DIFFICILE QUICK SCREEN W PCR REFLEX
C DIFFICILE (CDIFF) INTERP: NEGATIVE
C DIFFICILE (CDIFF) TOXIN: NEGATIVE
C Diff antigen: NEGATIVE

## 2015-11-15 LAB — GLUCOSE, CAPILLARY: Glucose-Capillary: 111 mg/dL — ABNORMAL HIGH (ref 65–99)

## 2015-11-15 LAB — MRSA PCR SCREENING: MRSA by PCR: NEGATIVE

## 2015-11-15 LAB — LACTIC ACID, PLASMA: Lactic Acid, Venous: 0.6 mmol/L (ref 0.5–2.0)

## 2015-11-15 LAB — PHOSPHORUS: PHOSPHORUS: 3.4 mg/dL (ref 2.5–4.6)

## 2015-11-15 MED ORDER — ONDANSETRON HCL 4 MG/2ML IJ SOLN: 4.0000 mg | Freq: Four times a day (QID) | INTRAMUSCULAR | Status: DC | PRN

## 2015-11-15 MED ORDER — HYPROMELLOSE (GONIOSCOPIC) 2.5 % OP SOLN: 1.0000 [drp] | Freq: Two times a day (BID) | OPHTHALMIC | Status: DC

## 2015-11-15 MED ORDER — NOREPINEPHRINE 4 MG/250ML-% IV SOLN
INTRAVENOUS | Status: AC
  Administered 2015-11-15: 4 mg
  Filled 2015-11-15: qty 250

## 2015-11-15 MED ORDER — SUMATRIPTAN SUCCINATE 50 MG PO TABS
100.0000 mg | ORAL_TABLET | ORAL | Status: DC | PRN
  Administered 2015-11-17: 100 mg via ORAL
  Filled 2015-11-15: qty 2

## 2015-11-15 MED ORDER — IPRATROPIUM-ALBUTEROL 0.5-2.5 (3) MG/3ML IN SOLN
3.0000 mL | RESPIRATORY_TRACT | Status: DC
  Administered 2015-11-15 – 2015-11-16 (×5): 3 mL via RESPIRATORY_TRACT
  Filled 2015-11-15 (×7): qty 3

## 2015-11-15 MED ORDER — LACTATED RINGERS IV BOLUS (SEPSIS)
1000.0000 mL | Freq: Once | INTRAVENOUS | Status: AC
  Administered 2015-11-15: 1000 mL via INTRAVENOUS

## 2015-11-15 MED ORDER — PIPERACILLIN-TAZOBACTAM 4.5 G IVPB
4.5000 g | Freq: Three times a day (TID) | INTRAVENOUS | Status: DC
Start: 2015-11-15 — End: 2015-11-15
  Administered 2015-11-15: 4.5 g via INTRAVENOUS
  Filled 2015-11-15 (×3): qty 100

## 2015-11-15 MED ORDER — OSELTAMIVIR PHOSPHATE 30 MG PO CAPS
30.0000 mg | ORAL_CAPSULE | Freq: Two times a day (BID) | ORAL | Status: DC
  Administered 2015-11-15 – 2015-11-18 (×7): 30 mg via ORAL
  Filled 2015-11-15 (×7): qty 1

## 2015-11-15 MED ORDER — PHENYLEPHRINE HCL 10 MG/ML IJ SOLN
0.0000 ug/min | INTRAMUSCULAR | Status: DC
  Administered 2015-11-15: 20 ug/min via INTRAVENOUS
  Filled 2015-11-15: qty 1

## 2015-11-15 MED ORDER — OSELTAMIVIR PHOSPHATE 75 MG PO CAPS: 75.0000 mg | ORAL_CAPSULE | Freq: Two times a day (BID) | ORAL | Status: DC

## 2015-11-15 MED ORDER — SODIUM CHLORIDE 0.9 % IV SOLN
INTRAVENOUS | Status: DC
  Administered 2015-11-15: 02:00:00 via INTRAVENOUS

## 2015-11-15 MED ORDER — POLYVINYL ALCOHOL 1.4 % OP SOLN
1.0000 [drp] | Freq: Two times a day (BID) | OPHTHALMIC | Status: DC
  Administered 2015-11-15 – 2015-11-18 (×7): 1 [drp] via OPHTHALMIC
  Filled 2015-11-15: qty 15

## 2015-11-15 MED ORDER — AZITHROMYCIN 250 MG PO TABS: 250.0000 mg | ORAL_TABLET | Freq: Every day | ORAL | Status: DC

## 2015-11-15 MED ORDER — DEXTROSE 5 % IV SOLN
1.0000 g | INTRAVENOUS | Status: DC
  Administered 2015-11-15 – 2015-11-16 (×2): 1 g via INTRAVENOUS
  Filled 2015-11-15 (×3): qty 10

## 2015-11-15 MED ORDER — AZITHROMYCIN 250 MG PO TABS: 500.0000 mg | ORAL_TABLET | Freq: Every day | ORAL | Status: DC

## 2015-11-15 MED ORDER — NOREPINEPHRINE BITARTRATE 1 MG/ML IV SOLN
0.0000 ug/min | INTRAVENOUS | Status: DC
  Filled 2015-11-15: qty 4

## 2015-11-15 MED ORDER — OLOPATADINE HCL 0.7 % OP SOLN: 1.0000 [drp] | Freq: Every morning | OPHTHALMIC | Status: DC

## 2015-11-15 MED ORDER — HEPARIN SODIUM (PORCINE) 5000 UNIT/ML IJ SOLN
5000.0000 [IU] | Freq: Three times a day (TID) | INTRAMUSCULAR | Status: DC
  Administered 2015-11-15: 5000 [IU] via SUBCUTANEOUS
  Filled 2015-11-15: qty 1

## 2015-11-15 MED ORDER — ZONISAMIDE 100 MG PO CAPS
100.0000 mg | ORAL_CAPSULE | Freq: Every day | ORAL | Status: DC
  Administered 2015-11-15 – 2015-11-17 (×3): 100 mg via ORAL
  Filled 2015-11-15 (×5): qty 1

## 2015-11-15 MED ORDER — GUAIFENESIN 100 MG/5ML PO SOLN
5.0000 mL | ORAL | Status: DC | PRN
  Administered 2015-11-16 – 2015-11-17 (×2): 100 mg via ORAL
  Filled 2015-11-15 (×4): qty 10

## 2015-11-15 MED ORDER — ENOXAPARIN SODIUM 40 MG/0.4ML ~~LOC~~ SOLN
40.0000 mg | SUBCUTANEOUS | Status: DC
  Administered 2015-11-15: 40 mg via SUBCUTANEOUS
  Filled 2015-11-15: qty 0.4

## 2015-11-15 MED ORDER — SIMVASTATIN 10 MG PO TABS
10.0000 mg | ORAL_TABLET | Freq: Every day | ORAL | Status: DC
  Administered 2015-11-15 – 2015-11-17 (×3): 10 mg via ORAL
  Filled 2015-11-15 (×3): qty 1

## 2015-11-15 MED ORDER — VANCOMYCIN HCL IN DEXTROSE 750-5 MG/150ML-% IV SOLN
750.0000 mg | Freq: Once | INTRAVENOUS | Status: DC
  Filled 2015-11-15: qty 150

## 2015-11-15 MED ORDER — BENZONATATE 100 MG PO CAPS
200.0000 mg | ORAL_CAPSULE | Freq: Three times a day (TID) | ORAL | Status: DC
  Administered 2015-11-15 – 2015-11-18 (×10): 200 mg via ORAL
  Filled 2015-11-15 (×11): qty 2

## 2015-11-15 MED ORDER — PANTOPRAZOLE SODIUM 40 MG PO TBEC
40.0000 mg | DELAYED_RELEASE_TABLET | Freq: Every day | ORAL | Status: DC
Start: 2015-11-15 — End: 2015-11-18
  Administered 2015-11-15 – 2015-11-18 (×4): 40 mg via ORAL
  Filled 2015-11-15 (×4): qty 1

## 2015-11-15 MED ORDER — LORATADINE 10 MG PO TABS
10.0000 mg | ORAL_TABLET | Freq: Every day | ORAL | Status: DC
  Administered 2015-11-15 – 2015-11-18 (×4): 10 mg via ORAL
  Filled 2015-11-15 (×4): qty 1

## 2015-11-15 MED ORDER — PIPERACILLIN-TAZOBACTAM 3.375 G IVPB
3.3750 g | Freq: Three times a day (TID) | INTRAVENOUS | Status: DC
  Filled 2015-11-15 (×2): qty 50

## 2015-11-15 MED ORDER — ASPIRIN EC 81 MG PO TBEC
81.0000 mg | DELAYED_RELEASE_TABLET | Freq: Every day | ORAL | Status: DC
  Administered 2015-11-15 – 2015-11-18 (×4): 81 mg via ORAL
  Filled 2015-11-15 (×4): qty 1

## 2015-11-15 MED ORDER — VANCOMYCIN HCL IN DEXTROSE 750-5 MG/150ML-% IV SOLN
750.0000 mg | Freq: Two times a day (BID) | INTRAVENOUS | Status: DC
Start: 2015-11-15 — End: 2015-11-15
  Filled 2015-11-15 (×2): qty 150

## 2015-11-15 MED ORDER — OLOPATADINE HCL 0.1 % OP SOLN
1.0000 [drp] | Freq: Every day | OPHTHALMIC | Status: DC
  Administered 2015-11-15 – 2015-11-18 (×4): 1 [drp] via OPHTHALMIC
  Filled 2015-11-15: qty 5

## 2015-11-15 NOTE — Progress Notes (Signed)
Pharmacy ICU Daily Progress Note  Kristi Greer is a 65yo female admitted 11/14/15 for hypotension 2/2 prerenal dehydration from N/V stemming form influenza and severe sepsis.  PMH: CHF with EF 35%   Active pharmacy consults: vancomycin and zosyn management  Plan: 1. Per MD Ashby Dawes, will D/C vanc due to MRSA (-) and likely not needed.  2. Zosyn adjusted to 3.375gm EIV q8hrs  Infectious:  Antimicrobials:  Vancomycin 3/19>>3/20 Zosyn 3/19>> Oseltaminivir 30mg  PO BID started 3/19 WBC 8.9 (11.3) Afebrile Culture Results: BCID GI panel (-) MRSA PCR (-) CDiff (-) 3/19 BCx x2 NGTD  Electrolytes:  Potassium: 3.9 Magnesium: ? Phosphorus: ? Supplementation plans: none  Pulmonary: Bronchodilators? duonebs q4hrs Ventilator status: no O2 sat/FiO2: 98 on RA  Current steroids: hydrocortisone 4mg  q6hrs prn Taper plans:   GI:  Constipation PPx: none (pt having diarrhea) Feeding status: hea/rt healthy, thin liquids LBM: 3/20 SUP: pantoprazole 40mg  PO daily  Insulin:  SSI use in 24hrs: none Current Insulin orders: none Last 3 CBGs: 129, 125, 95  DVT PPx: Lovenox 40mg  SQ q24hrs  Sedation+Pain: no pain orders RASS goal? 0 GCS 15 Last pain score: 0 Opioid use in last 24 hrs:   Pressors: phenylephrine @ 32mcg/min, held now MAP goal: >65 (last MAP 56), BPs 93/66  Medication education/counseling required?

## 2015-11-15 NOTE — Telephone Encounter (Signed)
Order was placed into epic and shes scheduled. Wanted to give you FYI

## 2015-11-15 NOTE — Progress Notes (Signed)
[place] Progress Note Patient Name: Kristi Greer DOB: 08/22/1951 MRN: [id number]   Date of Service  11/15/2015  HPI/Events of Note  Admission to intensive care unit with sepsis and septic shock. Patient has Influenza a. Also has acute renal failure. Review of records shows echocardiogram from January with EF 35-40% and some mitral regurgitation but no mitral stenosis. The patient has received 4 L of IV fluid and per nursing report is currently on room air saturating okay. No increased work of breathing per nurse at bedside. Currently on norepinephrine at 2 g through peripheral IV.  eICU Interventions  1. Switch from norepinephrine to Neo-Synephrine low-dose 2. LR bolus 1 L over one hour 3. Close monitoring of respiratory status for signs of decompensation 4. Further care per primary service     Intervention Category Evaluation Type: New Patient Evaluation  Tera Partridge 11/15/2015, 3:13 AM

## 2015-11-15 NOTE — Progress Notes (Addendum)
ANTIBIOTIC CONSULT NOTE - INITIAL  Pharmacy Consult for Vancomycin , Zosyn  Indication: sepsis  No Known Allergies  Patient Measurements: Height: 5\' 1"  (154.9 cm) Weight: 156 lb (70.761 kg) IBW/kg (Calculated) : 47.8 Adjusted Body Weight: 57 kg   Vital Signs: Temp: 97.7 F (36.5 C) (03/20 0220) Temp Source: Axillary (03/20 0220) BP: 92/57 mmHg (03/20 0600) Pulse Rate: 65 (03/20 0600) Intake/Output from previous day: 03/19 0701 - 03/20 0700 In: 1307.5 [I.V.:307.5; IV Piggyback:1000] Out: 731 [Urine:730; Stool:1] Intake/Output from this shift:    Labs:  Recent Labs  11/14/15 2202 11/15/15 0319  WBC 11.3* 8.9  HGB 14.2 12.0  PLT 169 124*  CREATININE 1.71* 1.06*   Estimated Creatinine Clearance: 49.5 mL/min (by C-G formula based on Cr of 1.06). No results for input(s): VANCOTROUGH, VANCOPEAK, VANCORANDOM, GENTTROUGH, GENTPEAK, GENTRANDOM, TOBRATROUGH, TOBRAPEAK, TOBRARND, AMIKACINPEAK, AMIKACINTROU, AMIKACIN in the last 72 hours.   Microbiology: Recent Results (from the past 720 hour(s))  Rapid Influenza A&B Antigens ([place] only)     Status: Abnormal   Collection Time: 11/14/15 10:08 PM  Result Value Ref Range Status   Influenza A ([facility]) NEGATIVE NEGATIVE Final   Influenza B ([facility]) POSITIVE (A) NEGATIVE Final  MRSA PCR Screening     Status: None   Collection Time: 11/15/15  2:28 AM  Result Value Ref Range Status   MRSA by PCR NEGATIVE NEGATIVE Final    Comment:        The GeneXpert MRSA Assay (FDA approved for NASAL specimens only), is one component of a comprehensive MRSA colonization surveillance program. It is not intended to diagnose MRSA infection nor to guide or monitor treatment for MRSA infections.   C difficile quick scan w PCR reflex     Status: None   Collection Time: 11/15/15  2:32 AM  Result Value Ref Range Status   C Diff antigen NEGATIVE NEGATIVE Final   C Diff toxin NEGATIVE NEGATIVE Final   C Diff interpretation Negative for C. difficile   Final  Gastrointestinal Panel by PCR , Stool     Status: None   Collection Time: 11/15/15  2:32 AM  Result Value Ref Range Status   Campylobacter species NOT DETECTED NOT DETECTED Final   Plesimonas shigelloides NOT DETECTED NOT DETECTED Final   Salmonella species NOT DETECTED NOT DETECTED Final   Yersinia enterocolitica NOT DETECTED NOT DETECTED Final   Vibrio species NOT DETECTED NOT DETECTED Final   Vibrio cholerae NOT DETECTED NOT DETECTED Final   Enteroaggregative E coli (EAEC) NOT DETECTED NOT DETECTED Final   Enteropathogenic E coli (EPEC) NOT DETECTED NOT DETECTED Final   Enterotoxigenic E coli (ETEC) NOT DETECTED NOT DETECTED Final   Shiga like toxin producing E coli (STEC) NOT DETECTED NOT DETECTED Final   E. coli O157 NOT DETECTED NOT DETECTED Final   Shigella/Enteroinvasive E coli (EIEC) NOT DETECTED NOT DETECTED Final   Cryptosporidium NOT DETECTED NOT DETECTED Final   Cyclospora cayetanensis NOT DETECTED NOT DETECTED Final   Entamoeba histolytica NOT DETECTED NOT DETECTED Final   Giardia lamblia NOT DETECTED NOT DETECTED Final   Adenovirus F40/41 NOT DETECTED NOT DETECTED Final   Astrovirus NOT DETECTED NOT DETECTED Final   Norovirus GI/GII NOT DETECTED NOT DETECTED Final   Rotavirus A NOT DETECTED NOT DETECTED Final   Sapovirus (I, II, IV, and V) NOT DETECTED NOT DETECTED Final    Medical History: Past Medical History  Diagnosis Date  . Hyperlipidemia   . Migraines   . Hypertension   .  Allergy   . Personal history of colonic polyps   . Cancer ([place])     basal cell  . Congestive heart failure ([facility])     Medications:  Prescriptions prior to admission  Medication Sig Dispense Refill Last Dose  . aspirin 81 MG tablet Take 81 mg by mouth daily.   11/13/2015 at Unknown time  . calcium carbonate (OS-CAL) 600 MG TABS Take 600 mg by mouth at bedtime.    11/14/2015 at Unknown time  . carvedilol (COREG) 6.25 MG tablet Take 1.5 tablets (9.375 mg total) by mouth 2 (two)  times daily with a meal. 90 tablet 6 11/14/2015 at PM  . cholecalciferol (VITAMIN D) 1000 UNITS tablet Take 2,000 Units by mouth at bedtime.    11/14/2015 at Unknown time  . fexofenadine (ALLEGRA) 180 MG tablet Take 180 mg by mouth at bedtime.    11/14/2015 at Unknown time  . furosemide (LASIX) 20 MG tablet Take 1 tablet (20 mg total) by mouth daily. 60 tablet 1 11/14/2015 at Unknown time  . hydroxypropyl methylcellulose / hypromellose (ISOPTO TEARS / GONIOVISC) 2.5 % ophthalmic solution Place 1 drop into both eyes 2 (two) times daily.   11/14/2015 at Unknown time  . lisinopril (PRINIVIL,ZESTRIL) 5 MG tablet Take 5 mg by mouth at bedtime. Reported on 11/11/2015   11/13/2015 at Unknown time  . Olopatadine HCl (PAZEO) 0.7 % SOLN Place 1 drop into both eyes every morning.    11/14/2015 at Unknown time  . omeprazole (PRILOSEC) 20 MG capsule Take 1 capsule (20 mg total) by mouth 2 (two) times daily before a meal. 60 capsule 3 11/14/2015 at Unknown time  . simvastatin (ZOCOR) 10 MG tablet Take 10 mg by mouth at bedtime.   11/13/2015 at Unknown time  . spironolactone (ALDACTONE) 25 MG tablet Take 0.5 tablets (12.5 mg total) by mouth daily. 45 tablet 3 11/14/2015 at Unknown time  . SUMAtriptan (IMITREX) 100 MG tablet Take 100 mg by mouth every 2 (two) hours as needed for migraine. May repeat in 2 hours if headache persists or recurs.   PRN at PRN  . zonisamide (ZONEGRAN) 100 MG capsule Take 1 capsule (100 mg total) by mouth at bedtime. 30 capsule 1 11/13/2015 at Unknown time   Assessment: Pharmacy consulted to dose vancomycin and zosyn in this 65 year old female admitted with septic shock.   CrCl = 49.5 ml/min Ke = 0.045 hr-1 T1/2 = 15.4 hrs Vd = 39.9 L   Goal of Therapy:  Vancomycin trough level 15-20 mcg/ml  Plan:  Expected duration 7 days with resolution of temperature and/or normalization of WBC   Zosyn 3.375 gm IV X 1 given in ED on 3/19 @ 22:30. Zosyn 4.5 gm IV Q8H EI ordered to start 3/20 @  0700.  Vancomycin 1 gm IV X 1 given in ED 3/19 @ 23:30.  Vancomycin 750 mg IV Q12H ordered to start 3/20 @ 12:00, ~ 12 hrs after 1st dose (stacked dosing). This pt will reach [facility] by 3/22 @ 23:00.  Will draw 1st trough on 3/22 @ 23:30, which will be at [facility].   Nicklos Gaxiola D 11/15/2015,7:02 AM

## 2015-11-15 NOTE — Progress Notes (Signed)
Patient ID: Julayne Shreiner, female   DOB: [birth year], 65 y.o.   MRN: [id number] [facility] [place] NAME: Islay Freda    MR#:  [id number]  DATE OF BIRTH:  21-Oct-1950  SUBJECTIVE:   Came in with fevr, cough and bodyaches. On neosynephrine gtt due to septic shock. Feels better this am REVIEW OF SYSTEMS:   Review of Systems  Constitutional: Positive for fever. Negative for chills and weight loss.  HENT: Negative for ear discharge, ear pain and nosebleeds.   Eyes: Negative for blurred vision, pain and discharge.  Respiratory: Positive for cough, sputum production and shortness of breath. Negative for wheezing and stridor.   Cardiovascular: Negative for chest pain, palpitations, orthopnea and PND.  Gastrointestinal: Negative for nausea, vomiting, abdominal pain and diarrhea.  Genitourinary: Negative for urgency and frequency.  Musculoskeletal: Positive for myalgias and back pain. Negative for joint pain.  Neurological: Positive for weakness. Negative for sensory change, speech change and focal weakness.  Psychiatric/Behavioral: Negative for depression and hallucinations. The patient is not nervous/anxious.   All other systems reviewed and are negative.  Tolerating Diet:yes Tolerating PT: pending  DRUG ALLERGIES:  No Known Allergies  VITALS:  Blood pressure 107/62, pulse 76, temperature 97.9 F (36.6 C), temperature source Oral, resp. rate 17, height 5\' 1"  (1.549 m), weight 70.761 kg (156 lb), last menstrual period 10/18/2012, SpO2 100 %.  PHYSICAL EXAMINATION:   Physical Exam  GENERAL:  65 y.o.-year-old patient lying in the bed with no acute distress. Appears ill EYES: Pupils equal, round, reactive to light and accommodation. No scleral icterus. Extraocular muscles intact.  HEENT: Head atraumatic, normocephalic. Oropharynx and nasopharynx clear.  NECK:  Supple, no jugular venous distention. No thyroid enlargement, no  tenderness.  LUNGS: coarse breath sounds bilaterally, no wheezing, rales, rhonchi. No use of accessory muscles of respiration.  CARDIOVASCULAR: S1, S2 normal. No murmurs, rubs, or gallops. Mild tachy ABDOMEN: Soft, nontender, nondistended. Bowel sounds present. No organomegaly or mass.  EXTREMITIES: No cyanosis, clubbing or edema b/l.    NEUROLOGIC: Cranial nerves II through XII are intact. No focal Motor or sensory deficits b/l.  gen weakness PSYCHIATRIC:  patient is alert and oriented x 3.  SKIN: No obvious rash, lesion, or ulcer.   LABORATORY PANEL:  CBC  Recent Labs Lab 11/15/15 0319  WBC 8.9  HGB 12.0  HCT 36.7  PLT 124*    Chemistries   Recent Labs Lab 11/14/15 2202 11/15/15 0319  NA 136 139  K 4.0 3.9  CL 108 119*  CO2 22 18*  GLUCOSE 125* 129*  BUN 26* 19  CREATININE 1.71* 1.06*  CALCIUM 8.6* 7.3*  AST 29  --   ALT 21  --   ALKPHOS 90  --   BILITOT 0.6  --    Cardiac Enzymes  Recent Labs Lab 11/14/15 2202  TROPONINI <0.03   RADIOLOGY:  Ct Abdomen Pelvis Wo Contrast  11/14/2015  CLINICAL DATA:  Fever, hypotension, vomiting and diarrhea. Onset yesterday. EXAM: CT ABDOMEN AND PELVIS WITHOUT CONTRAST TECHNIQUE: Multidetector CT imaging of the abdomen and pelvis was performed following the standard protocol without IV contrast. COMPARISON:  10/27/2013 FINDINGS: There is cholecystectomy. There are unremarkable unenhanced appearances of the liver and bile ducts. There are unremarkable unenhanced appearances of the spleen, pancreas, adrenals and kidneys. Parapelvic cysts are incidentally noted about both kidneys, unchanged from 10/27/2013. Ureters and urinary bladder are unremarkable. There is a small hiatal hernia. Stomach is otherwise  unremarkable. Small bowel is unremarkable. Appendix is normal. Colon is remarkable only for minimal uncomplicated diverticulosis. The abdominal aorta is normal in caliber. There is no atherosclerotic calcification. There is no  adenopathy in the abdomen or pelvis. No acute inflammatory changes are evident in the abdomen or pelvis. There is no adenopathy. There is no ascites. There is prior hysterectomy. No adnexal abnormalities are evident. No significant abnormalities evident in the lower chest. There is mild unchanged linear basilar scarring. No significant skeletal lesion is evident. Moderate degenerative lumbar disc disease is present, greatest at L2-3. IMPRESSION: 1. No acute findings are evident in the abdomen or pelvis. 2. Small hiatal hernia. 3. Diverticulosis. Electronically Signed   By: Andreas Newport M.D.   On: 11/14/2015 23:30   Dg Chest Portable 1 View  11/14/2015  CLINICAL DATA:  Fever and hypotension, onset last night. EXAM: PORTABLE CHEST 1 VIEW COMPARISON:  08/31/2015 FINDINGS: There is mild unchanged cardiomegaly. The lungs are clear. There is no pleural effusion. The pulmonary vasculature is normal. IMPRESSION: Cardiomegaly.  No acute findings. Electronically Signed   By: Andreas Newport M.D.   On: 11/14/2015 23:14   ASSESSMENT AND PLAN:  Leanny Kelder is a 65 y.o. female with a known history of Congestive heart failure with EF 35%, hyperlipidemia, migraine headache, hypertension and colonic polyps. She presents to the ED today due to shortness of breath for the last 1 day. She was in her normal state of health until 2 days ago when she developed generalized feeling of cold and yesterday, she developed sore throats which was followed by cough productive of yellowish sputum as well as fever, aches and pain all over her body.  1). Septic shock/urinary tract infection/poitive Influenza - Patient presented with a blood pressure of 65/38 and despite 4 L of normal saline, patient still has a systolic in the [id number]. Etiology of septic shock is likely due to urinary tract infection as urinalysis is consistent with a urinary tract infection. - was on vancomycin and Zosyn---IV rocephin  - Continue IV fluids and  now weaned offLevophed - Lactic acid is normal and troponins negative. - Hold all blood pressure medications at this time.  2). Influenza A - Patient presents with upper respiratory tract infection and has a contact was diagnosed with influenza. - Influenza A was positive - Patient has been started on Tamiflu and will be continued on DuoNeb, guaifenesin. - Continue droplet isolation.  3). Near-syncope/Volume depletion/acute kidney injury - Patient had an episode of near syncope and this is most likely related to volume depletion from inadequate oral intake as well as diarrhea nausea and vomiting in addition to septic shock - Creatinine is elevated at 1.71 from a baseline of 0.97. Patient has received a total of 4 L of normal saline and has been started on Levophed.. - Monitor creatinine and volume status closely. - Hold Lasix and Aldactone 4). Acute gastroenteritis - Etiology is unclear but most likely related to viral gastroneuritis. - C. difficile negative - Continue supportive management. Overall improving slowly. Will transfer out to floor once off pressors  CODE STATUS: full  DVT Prophylaxis: lovenox  TOTAL criticalTIME TAKING CARE OF THIS PATIENT: 35 minutes.  >50% time spent on counselling and coordination of care  POSSIBLE D/C IN 1-2 DAYS, DEPENDING ON CLINICAL CONDITION.  Note: This dictation was prepared with Dragon dictation along with smaller phrase technology. Any transcriptional errors that result from this process are unintentional.  Jihad Brownlow M.D on 11/15/2015 at 6:24 PM  Between  7am to 6pm - Pager - [phone]  After 6pm go to [email or web address] - password [email or web address] [facility]  Office  [phone]  CC: Primary care physician; Einar Pheasant, MD

## 2015-11-15 NOTE — Progress Notes (Signed)
Dr. Pierre Bali patient has a prescription to have a BMET drawn. It was given so she could have it done here at [facility] station due to it being a shorter driver.  pelase fax to Dr. Pierre Bali office.

## 2015-11-15 NOTE — Telephone Encounter (Signed)
Thank you.  I appreciate all of your help.

## 2015-11-15 NOTE — H&P (Signed)
[place] [place] NAME: Kristi Greer    MR#:  [id number]  DATE OF BIRTH:  03-03-1951  DATE OF ADMISSION:  11/14/2015  PRIMARY CARE PHYSICIAN: Einar Pheasant, MD   REQUESTING/REFERRING PHYSICIAN: Dr Jacqualine Code  CHIEF COMPLAINT:   Chief Complaint  Patient presents with  . Fever    HISTORY OF PRESENT ILLNESS: Kristi Greer  is a 65 y.o. female with a known history of Congestive heart failure with EF 35%, hyperlipidemia, migraine headache, hypertension and colonic polyps. She presents to the ED today due to shortness of breath for the last 1 day. She was in her normal state of health until 2 days ago when she developed generalized feeling of cold and yesterday, she developed sore throats which was followed by cough productive of yellowish sputum as well as fever, aches and pain all over her body. At one point, her temperature was 108F at home and she had a very bad night due to discomfort. In the morning, she planned to go to the walk-in clinic and on her way to the bathroom to get ready. When she walked into the shower, she developed nausea and vomiting in the shower. Vomitus contents only previously ingested food and was of one episode. She has had a total of 3 episodes of diarrhea. She also had some abdominal pain in the lower part and complaining of some dysuria but denies hematuria or urinary frequency. She reports a near syncope later in the day and at that time, she took her blood pressure was 72/59. She denies any chest pain but reports dizziness and generalized fatigue as well as diaphoresis. She denies any tobacco use, alcohol or recreational drug use. She denies any travel recently or consumption of unusual food. She however reports that she had a contact with a person that was diagnosed with influenza at her work place.  On arrival in the ED, blood pressure was 65/38 and pulse was 116 with a respiratory rate of 22. Troponin I  lactic acid were negative and CBC was only significant for a white count of 11.3. CMP was seen again for a BUN of 26 from baseline of 19 and creatinine of 1.71 from baseline of 0.97. Influence that it was positive. Urinalysis was consistent with urinary tract infection. EKG showed normal sinus rhythm with low voltage and chest x-ray showed cardiac megaly with no acute cardiopulmonary process. CT of the abdomen showed diverticulosis with no acute process. Blood cultures were obtained and patient received a total of 4 L of normal saline before her blood pressure stabilized in the ED. She was also started on vancomycin and Zosyn as well as Tamiflu and acetaminophen. At this time, patient is alert and oriented and will be admitted to the ICU due to septic shock, influenza A, UTI, acute kidney injury/fluid depletion and acute gastroenteritis.  She wishes to be full code.  PAST MEDICAL HISTORY:   Past Medical History  Diagnosis Date  . Hyperlipidemia   . Migraines   . Hypertension   . Allergy   . Personal history of colonic polyps   . Cancer ([place])     basal cell  . Congestive heart failure ([place])     PAST SURGICAL HISTORY:  Past Surgical History  Procedure Laterality Date  . Tubal ligation    . Finger surgery    . Cholecystectomy  2006  . Vaginal hysterectomy      midurethral sling  . Hysteroscopy  2011, 10/2010  SOCIAL HISTORY:  Social History  Substance Use Topics  . Smoking status: Never Smoker   . Smokeless tobacco: Never Used  . Alcohol Use: 0.0 oz/week    0 Standard drinks or equivalent per week     Comment: Socially    FAMILY HISTORY:  Family History  Problem Relation Age of Onset  . Hypertension Mother   . Stroke Mother   . Hypercholesterolemia Father   . Diabetes Mother   . Heart disease Father     MI  . Hypercholesterolemia Father   . Hypertension Father   . Stroke Father   . Breast cancer Neg Hx   . Colon cancer Neg Hx     DRUG ALLERGIES: No Known  Allergies  REVIEW OF SYSTEMS:   CONSTITUTIONAL: As in history of present illness EYES: No blurred or double vision.  EARS, NOSE, AND THROAT: No tinnitus or ear pain.  RESPIRATORY: As in history of present illness.  CARDIOVASCULAR: No chest pain, orthopnea, edema.  GASTROINTESTINAL: As in history of present illness GENITOURINARY: + dysuria, No hematuria.  ENDOCRINE: No polyuria, nocturia,  HEMATOLOGY: No anemia, easy bruising or bleeding SKIN: No rash or lesion. MUSCULOSKELETAL: No arthritis.   NEUROLOGIC: No tingling, numbness, weakness.  PSYCHIATRY: No anxiety or depression.   MEDICATIONS AT HOME:  Prior to Admission medications   Medication Sig Start Date End Date Taking? Authorizing Provider  aspirin 81 MG tablet Take 81 mg by mouth daily.   Yes Historical Provider, MD  calcium carbonate (OS-CAL) 600 MG TABS Take 600 mg by mouth at bedtime.    Yes Historical Provider, MD  carvedilol (COREG) 6.25 MG tablet Take 1.5 tablets (9.375 mg total) by mouth 2 (two) times daily with a meal. 11/11/15  Yes Jolaine Artist, MD  cholecalciferol (VITAMIN D) 1000 UNITS tablet Take 2,000 Units by mouth at bedtime.    Yes Historical Provider, MD  fexofenadine (ALLEGRA) 180 MG tablet Take 180 mg by mouth at bedtime.    Yes Historical Provider, MD  furosemide (LASIX) 20 MG tablet Take 1 tablet (20 mg total) by mouth daily. 09/03/15  Yes Henreitta Leber, MD  hydroxypropyl methylcellulose / hypromellose (ISOPTO TEARS / GONIOVISC) 2.5 % ophthalmic solution Place 1 drop into both eyes 2 (two) times daily.   Yes Historical Provider, MD  lisinopril (PRINIVIL,ZESTRIL) 5 MG tablet Take 5 mg by mouth at bedtime. Reported on 11/11/2015   Yes Historical Provider, MD  Olopatadine HCl (PAZEO) 0.7 % SOLN Place 1 drop into both eyes every morning.    Yes Historical Provider, MD  omeprazole (PRILOSEC) 20 MG capsule Take 1 capsule (20 mg total) by mouth 2 (two) times daily before a meal. 09/21/15  Yes Einar Pheasant, MD   simvastatin (ZOCOR) 10 MG tablet Take 10 mg by mouth at bedtime.   Yes Historical Provider, MD  spironolactone (ALDACTONE) 25 MG tablet Take 0.5 tablets (12.5 mg total) by mouth daily. 11/11/15  Yes Jolaine Artist, MD  SUMAtriptan (IMITREX) 100 MG tablet Take 100 mg by mouth every 2 (two) hours as needed for migraine. May repeat in 2 hours if headache persists or recurs.   Yes Historical Provider, MD  zonisamide (ZONEGRAN) 100 MG capsule Take 1 capsule (100 mg total) by mouth at bedtime. 12/02/14  Yes Einar Pheasant, MD      PHYSICAL EXAMINATION:   VITAL SIGNS: Blood pressure 85/58, pulse 92, temperature 98.2 F (36.8 C), temperature source Oral, resp. rate 18, height 5\' 1"  (1.549 m), weight 70.761  kg (156 lb), last menstrual period 10/18/2012, SpO2 99 %.  GENERAL:  65 y.o.-year-old patient lying in the bed with no acute distress. Alert and oriented x 3. EYES: Pupils equal, round, reactive to light and accommodation. No scleral icterus. Extraocular muscles intact.  HEENT: Head atraumatic, normocephalic. Oropharynx and nasopharynx clear.  NECK:  Supple, no jugular venous distention. No thyroid enlargement, no tenderness.  LUNGS: Decreased air entry in all lung zones, no wheezing, rales,rhonchi or crepitation. No use of accessory muscles of respiration.  CARDIOVASCULAR: S1, S2 normal. No murmurs, rubs, or gallops.  ABDOMEN: Soft, mild suprapubic tenderness, nondistended. Bowel sounds present. No organomegaly or mass.  EXTREMITIES: No pedal edema, cyanosis, or clubbing.  NEUROLOGIC: Cranial nerves II through XII are intact. Muscle strength 5/5 in all extremities. Sensation intact. Gait not checked.   SKIN: No obvious rash, lesion, or ulcer.   LABORATORY PANEL:   CBC  Recent Labs Lab 11/14/15 2202  WBC 11.3*  HGB 14.2  HCT 43.4  PLT 169  MCV 83.5  MCH 27.3  MCHC 32.7  RDW 14.7*    ------------------------------------------------------------------------------------------------------------------  Chemistries   Recent Labs Lab 11/11/15 1627 11/14/15 2202  NA 140 136  K 4.2 4.0  CL 104 108  CO2 25 22  GLUCOSE 95 125*  BUN 19 26*  CREATININE 1.13* 1.71*  CALCIUM 9.5 8.6*  AST  --  29  ALT  --  21  ALKPHOS  --  90  BILITOT  --  0.6   ------------------------------------------------------------------------------------------------------------------ estimated creatinine clearance is 30.7 mL/min (by C-G formula based on Cr of 1.71). ------------------------------------------------------------------------------------------------------------------ No results for input(s): TSH, T4TOTAL, T3FREE, THYROIDAB in the last 72 hours.  Invalid input(s): FREET3   Coagulation profile No results for input(s): INR, PROTIME in the last 168 hours. ------------------------------------------------------------------------------------------------------------------- No results for input(s): DDIMER in the last 72 hours. -------------------------------------------------------------------------------------------------------------------  Cardiac Enzymes  Recent Labs Lab 11/14/15 2202  TROPONINI <0.03   ------------------------------------------------------------------------------------------------------------------ Invalid input(s): POCBNP  ---------------------------------------------------------------------------------------------------------------  Urinalysis    Component Value Date/Time   COLORURINE YELLOW* 11/15/2015 0003   APPEARANCEUR CLOUDY* 11/15/2015 0003   LABSPEC 1.012 11/15/2015 0003   PHURINE 5.0 11/15/2015 0003   GLUCOSEU NEGATIVE 11/15/2015 0003   GLUCOSEU NEGATIVE 06/29/2015 1040   HGBUR 1+* 11/15/2015 0003   BILIRUBINUR NEGATIVE 11/15/2015 0003   BILIRUBINUR neg 06/12/2014 1316   KETONESUR NEGATIVE 11/15/2015 0003   PROTEINUR NEGATIVE 11/15/2015  0003   PROTEINUR neg 06/12/2014 1316   UROBILINOGEN 0.2 06/29/2015 1040   UROBILINOGEN 0.2 06/12/2014 1316   NITRITE POSITIVE* 11/15/2015 0003   NITRITE pos 06/12/2014 1316   LEUKOCYTESUR 3+* 11/15/2015 0003     RADIOLOGY: Ct Abdomen Pelvis Wo Contrast  11/14/2015  CLINICAL DATA:  Fever, hypotension, vomiting and diarrhea. Onset yesterday. EXAM: CT ABDOMEN AND PELVIS WITHOUT CONTRAST TECHNIQUE: Multidetector CT imaging of the abdomen and pelvis was performed following the standard protocol without IV contrast. COMPARISON:  10/27/2013 FINDINGS: There is cholecystectomy. There are unremarkable unenhanced appearances of the liver and bile ducts. There are unremarkable unenhanced appearances of the spleen, pancreas, adrenals and kidneys. Parapelvic cysts are incidentally noted about both kidneys, unchanged from 10/27/2013. Ureters and urinary bladder are unremarkable. There is a small hiatal hernia. Stomach is otherwise unremarkable. Small bowel is unremarkable. Appendix is normal. Colon is remarkable only for minimal uncomplicated diverticulosis. The abdominal aorta is normal in caliber. There is no atherosclerotic calcification. There is no adenopathy in the abdomen or pelvis. No acute inflammatory changes are evident in the abdomen or  pelvis. There is no adenopathy. There is no ascites. There is prior hysterectomy. No adnexal abnormalities are evident. No significant abnormalities evident in the lower chest. There is mild unchanged linear basilar scarring. No significant skeletal lesion is evident. Moderate degenerative lumbar disc disease is present, greatest at L2-3. IMPRESSION: 1. No acute findings are evident in the abdomen or pelvis. 2. Small hiatal hernia. 3. Diverticulosis. Electronically Signed   By: Andreas Newport M.D.   On: 11/14/2015 23:30   Dg Chest Portable 1 View  11/14/2015  CLINICAL DATA:  Fever and hypotension, onset last night. EXAM: PORTABLE CHEST 1 VIEW COMPARISON:  08/31/2015  FINDINGS: There is mild unchanged cardiomegaly. The lungs are clear. There is no pleural effusion. The pulmonary vasculature is normal. IMPRESSION: Cardiomegaly.  No acute findings. Electronically Signed   By: Andreas Newport M.D.   On: 11/14/2015 23:14    EKG: Orders placed or performed during the hospital encounter of 11/14/15  . ED EKG  . ED EKG  . EKG 12-Lead  . EKG 12-Lead    ASSESSMENT  Active Problems:   UTI (lower urinary tract infection)   AKI (acute kidney injury) ([place])   Volume depletion   Influenza A   Near syncope   Acute gastroenteritis   Septic shock (HCC)  PLAN   1). Septic shock/urinary tract infection - Patient presented with a blood pressure of 65/38 and despite 4 L of normal saline, patient still has a systolic in the [id number]. Etiology of septic shock is likely due to urinary tract infection as urinalysis is consistent with a urinary tract infection. - Continue vancomycin and Zosyn and follow results of blood cultures and adjust antibiotics as necessary. - Continue IV fluids and Levophed - Lactic acid is normal and troponins negative. - Hold all blood pressure medications at this time.  2). Influenza A - Patient presents with upper respiratory tract infection and has a contact was diagnosed with influenza. - Influenza A was positive - Patient has been started on Tamiflu and will be continued on DuoNeb, guaifenesin. - Continue droplet isolation.  3). Near-syncope/Volume depletion/acute kidney injury - Patient had an episode of near syncope and this is most likely related to volume depletion from inadequate oral intake as well as diarrhea nausea and vomiting in addition to septic shock - Creatinine is elevated at 1.71 from a baseline of 0.97. Patient has received a total of 4 L of normal saline and has been started on Levophed.. - Monitor creatinine and volume status closely. - Initial fall precautions. - Hold Lasix and Aldactone 4). Acute gastroenteritis -  Etiology is unclear but most likely related to viral gastroneuritis. - C. difficile and other stool studies have been sent. We will wait for the results and treat as appropriate. - Continue supportive management.  All the records are reviewed and case discussed with ED provider. Management plans discussed with the patient, family and they are in agreement.  CODE STATUS: Code Status History    Date Active Date Inactive Code Status Order ID Comments User Context   09/01/2015 12:05 AM 09/03/2015  2:24 PM Full Code [id number]  L[place]le [place], MD ED      I have independently reviewed all EKG and chest x-ray data  VTE prophylaxis: Heparin if no contraindications and patient at low risk for bleeding. SCD's and progressive ambulation if patient has contraindications to anticoagulants. No DVT prophylaxis if patient presently receiving therapeutic anticoagulation or is at significant risk of bleeding for which the risk of  anticoagulation outweigh the potential benefits.  Vaccinations: Pneumonia & flu vaccine per hospital protocol Prevention: Will proceed with conservative measures for the prevention of delirium in patients older than 65. Fall precautions and 1:1 sitter as needed per hospital protocol.  TOTAL TIME TAKING CARE OF THIS PATIENT: 55 minutes.    Sylvan Cheese M.D on 11/15/2015 at 1:24 AM  Between 7am to 6pm - Pager - [phone]  After 6pm go to [email or web address] - password [email or web address] [facility]  Office  [phone]  CC: Primary care physician; Einar Pheasant, MD

## 2015-11-15 NOTE — Progress Notes (Signed)
Pt is alert and oriented.  Sats are 100% on room air.  SR with 1st degree heart block.  Map 60's and 70's off of Neo gtt since 0900.  UOP adequate.  Patient has orders to move to 2A when bed becomes available.  Last diarrhea was at 1550.  Patient tolerating getting up to [facility] with standby assist with no adverse reaction.  Pt states that nebulizer treatments make her feel worse as far as not being able to catch her breath just immediately after taking them, as well as her experiencing coughing fits immediately after.  MD notified.  No other issues noted.

## 2015-11-15 NOTE — Care Management (Signed)
Patient admitted to icu due to sepsis and flu.  Requiring vasopressors.  She had admission at [facility] 08/2015 for tachycardia and heart failure.  Recently established at [facility] clinic at [facility].  Saw Dr Valora Piccolo 11/11/18.

## 2015-11-16 DIAGNOSIS — J111 Influenza due to unidentified influenza virus with other respiratory manifestations: Secondary | ICD-10-CM | POA: Insufficient documentation

## 2015-11-16 DIAGNOSIS — I4729 Other ventricular tachycardia: Secondary | ICD-10-CM

## 2015-11-16 DIAGNOSIS — I499 Cardiac arrhythmia, unspecified: Secondary | ICD-10-CM

## 2015-11-16 DIAGNOSIS — I420 Dilated cardiomyopathy: Secondary | ICD-10-CM | POA: Insufficient documentation

## 2015-11-16 DIAGNOSIS — N179 Acute kidney failure, unspecified: Secondary | ICD-10-CM

## 2015-11-16 DIAGNOSIS — K529 Noninfective gastroenteritis and colitis, unspecified: Secondary | ICD-10-CM

## 2015-11-16 DIAGNOSIS — I471 Supraventricular tachycardia: Secondary | ICD-10-CM

## 2015-11-16 DIAGNOSIS — I472 Ventricular tachycardia: Secondary | ICD-10-CM

## 2015-11-16 DIAGNOSIS — R55 Syncope and collapse: Secondary | ICD-10-CM

## 2015-11-16 DIAGNOSIS — I951 Orthostatic hypotension: Secondary | ICD-10-CM

## 2015-11-16 HISTORY — DX: Other ventricular tachycardia: I47.29

## 2015-11-16 LAB — RENAL FUNCTION PANEL
Albumin: 3 g/dL — ABNORMAL LOW (ref 3.5–5.0)
Anion gap: 4 — ABNORMAL LOW (ref 5–15)
BUN: 14 mg/dL (ref 6–20)
CHLORIDE: 112 mmol/L — AB (ref 101–111)
CO2: 20 mmol/L — AB (ref 22–32)
CREATININE: 1.02 mg/dL — AB (ref 0.44–1.00)
Calcium: 7.5 mg/dL — ABNORMAL LOW (ref 8.9–10.3)
GFR, EST NON AFRICAN AMERICAN: 58 mL/min — AB (ref >60)
Glucose, Bld: 96 mg/dL (ref 65–99)
POTASSIUM: 3.3 mmol/L — AB (ref 3.5–5.1)
Phosphorus: 1.8 mg/dL — ABNORMAL LOW (ref 2.5–4.6)
Sodium: 136 mmol/L (ref 135–145)

## 2015-11-16 LAB — CBC WITH DIFFERENTIAL/PLATELET
BASOS ABS: 0 10*3/uL (ref 0–0.1)
Basophils Relative: 0 %
Eosinophils Absolute: 0.1 10*3/uL (ref 0–0.7)
Eosinophils Relative: 1 %
HEMATOCRIT: 36.5 % (ref 35.0–47.0)
HEMOGLOBIN: 11.8 g/dL — AB (ref 12.0–16.0)
LYMPHS PCT: 30 %
Lymphs Abs: 3.2 10*3/uL (ref 1.0–3.6)
MCH: 27.1 pg (ref 26.0–34.0)
MCHC: 32.4 g/dL (ref 32.0–36.0)
MCV: 83.7 fL (ref 80.0–100.0)
MONO ABS: 0.5 10*3/uL (ref 0.2–0.9)
MONOS PCT: 5 %
NEUTROS ABS: 7.2 10*3/uL — AB (ref 1.4–6.5)
Neutrophils Relative %: 64 %
Platelets: 131 10*3/uL — ABNORMAL LOW (ref 150–440)
RBC: 4.36 MIL/uL (ref 3.80–5.20)
RDW: 14.8 % — AB (ref 11.5–14.5)
WBC: 11 10*3/uL (ref 3.6–11.0)

## 2015-11-16 LAB — URINE CULTURE: SPECIAL REQUESTS: NORMAL

## 2015-11-16 LAB — TROPONIN I: Troponin I: 0.17 ng/mL — ABNORMAL HIGH (ref <0.031)

## 2015-11-16 LAB — MAGNESIUM: Magnesium: 1.6 mg/dL — ABNORMAL LOW (ref 1.7–2.4)

## 2015-11-16 MED ORDER — AMIODARONE HCL 200 MG PO TABS
400.0000 mg | ORAL_TABLET | Freq: Once | ORAL | Status: AC
  Administered 2015-11-16: 400 mg via ORAL
  Filled 2015-11-16: qty 2

## 2015-11-16 MED ORDER — DEXTROSE 5 % IV SOLN
3.0000 g | Freq: Once | INTRAVENOUS | Status: AC
  Administered 2015-11-16: 3 g via INTRAVENOUS
  Filled 2015-11-16: qty 6

## 2015-11-16 MED ORDER — POTASSIUM CHLORIDE CRYS ER 20 MEQ PO TBCR
20.0000 meq | EXTENDED_RELEASE_TABLET | Freq: Once | ORAL | Status: AC
  Administered 2015-11-16: 20 meq via ORAL
  Filled 2015-11-16: qty 1

## 2015-11-16 MED ORDER — DEXTROSE 5 % IV SOLN
20.0000 mmol | Freq: Once | INTRAVENOUS | Status: AC
  Administered 2015-11-16: 20 mmol via INTRAVENOUS
  Filled 2015-11-16: qty 6.67

## 2015-11-16 MED ORDER — DILTIAZEM HCL 30 MG PO TABS: 30.0000 mg | ORAL_TABLET | Freq: Four times a day (QID) | ORAL | Status: DC

## 2015-11-16 MED ORDER — LEVALBUTEROL HCL 0.63 MG/3ML IN NEBU
0.6300 mg | INHALATION_SOLUTION | RESPIRATORY_TRACT | Status: DC
  Administered 2015-11-16 – 2015-11-17 (×4): 0.63 mg via RESPIRATORY_TRACT
  Filled 2015-11-16 (×3): qty 3

## 2015-11-16 MED ORDER — MAGNESIUM SULFATE 2 GM/50ML IV SOLN
2.0000 g | Freq: Once | INTRAVENOUS | Status: DC
  Filled 2015-11-16: qty 50

## 2015-11-16 MED ORDER — ENOXAPARIN SODIUM 80 MG/0.8ML ~~LOC~~ SOLN
1.0000 mg/kg | Freq: Two times a day (BID) | SUBCUTANEOUS | Status: DC
  Administered 2015-11-16 – 2015-11-18 (×4): 70 mg via SUBCUTANEOUS
  Filled 2015-11-16 (×4): qty 0.8

## 2015-11-16 MED ORDER — DILTIAZEM HCL 100 MG IV SOLR
5.0000 mg/h | INTRAVENOUS | Status: DC
  Administered 2015-11-16: 5 mg/h via INTRAVENOUS

## 2015-11-16 MED ORDER — DILTIAZEM HCL 25 MG/5ML IV SOLN
10.0000 mg | Freq: Once | INTRAVENOUS | Status: DC
  Administered 2015-11-16: 10 mg via INTRAVENOUS

## 2015-11-16 MED ORDER — ZOLPIDEM TARTRATE 5 MG PO TABS
5.0000 mg | ORAL_TABLET | Freq: Once | ORAL | Status: AC
  Administered 2015-11-16: 5 mg via ORAL
  Filled 2015-11-16: qty 1

## 2015-11-16 MED ORDER — DILTIAZEM HCL 100 MG IV SOLR
5.0000 mg/h | INTRAVENOUS | Status: DC
  Filled 2015-11-16: qty 100

## 2015-11-16 MED ORDER — LEVALBUTEROL HCL 0.63 MG/3ML IN NEBU: 0.6300 mg | INHALATION_SOLUTION | RESPIRATORY_TRACT | Status: DC

## 2015-11-16 MED ORDER — ACETAMINOPHEN 325 MG PO TABS
650.0000 mg | ORAL_TABLET | ORAL | Status: DC | PRN
  Administered 2015-11-16 – 2015-11-17 (×2): 650 mg via ORAL
  Filled 2015-11-16 (×2): qty 2

## 2015-11-16 MED ORDER — IPRATROPIUM BROMIDE 0.02 % IN SOLN
0.5000 mg | RESPIRATORY_TRACT | Status: DC
  Administered 2015-11-16 – 2015-11-17 (×4): 0.5 mg via RESPIRATORY_TRACT
  Filled 2015-11-16 (×4): qty 2.5

## 2015-11-16 MED ORDER — AMIODARONE HCL 200 MG PO TABS
400.0000 mg | ORAL_TABLET | Freq: Two times a day (BID) | ORAL | Status: DC
  Administered 2015-11-16 – 2015-11-17 (×3): 400 mg via ORAL
  Filled 2015-11-16 (×3): qty 2

## 2015-11-16 MED ORDER — DILTIAZEM HCL 25 MG/5ML IV SOLN
INTRAVENOUS | Status: AC
  Administered 2015-11-16: 5 mg
  Filled 2015-11-16: qty 5

## 2015-11-16 MED ORDER — DILTIAZEM HCL 30 MG PO TABS
30.0000 mg | ORAL_TABLET | Freq: Four times a day (QID) | ORAL | Status: DC
  Administered 2015-11-16: 30 mg via ORAL
  Filled 2015-11-16: qty 1

## 2015-11-16 NOTE — Progress Notes (Signed)
Pharmacy ICU Daily Progress Note  Kristi Greer is a 65yo female admitted 11/14/15 for hypotension 2/2 prerenal dehydration from N/V stemming form influenza and severe sepsis.  PMH: CHF with EF 35%   Active pharmacy consults: electrolyte management  Plan: 1. 3/21 K 3.3, Mag 1.6, Phos 1.8. Will initiate Magnesium IV 2g x1, KPhos 74mmol IV x1 (which will also provide 51mEq of K), and KCl 58mEq tab PO x1 (total 53mEq of K).  Will recheck levels with AM labs.  Infectious:  Antimicrobials:  Vancomycin 3/19>> /20 Zosyn 3/19>> 3/20 Oseltaminivir 30mg  PO BID started 3/19 WBC 8.9 (11.3) Tmax 100 Culture Results: BCID GI panel (-) MRSA PCR (-) CDiff (-) 3/19 BCx x2 NGTD Influenza B (+)  Electrolytes:  Potassium: 3.3 Magnesium: 1.6 Phosphorus: 1.8 Supplementation plans: none  Pulmonary: Bronchodilators? duonebs q4hrs Ventilator status: no O2 sat/FiO2: 98 on RA  Current steroids: none Taper plans:   GI:  Constipation P/Px: none (pt having diarrhea) Feeding status: hea/rt healthy, thin liquids LBM: 3/20 SUP: pantoprazole 40mg  PO daily  Insulin:  SSI use in 24hrs: none Current Insulin orders: none Last 3 CBGs: 129, 125, 95  DVT PPx: Pt is now on full-dose lovenox (70mg  q12hrs) due to elevated troponins and short runs of Afib last night. For this reason, she is now on levalbuterol after having significant tachycardia after albuterol dose overnight.  Sedation+Pain: no pain orders RASS goal? 0 GCS 15 Last pain score: 0 Opioid use in last 24 hrs:   Pressors: none BPs 93/66  Medication education/counseling required? No

## 2015-11-16 NOTE — Progress Notes (Signed)
Spoke with MD, Dr. Posey Pronto, on the phone. MD notified about heart rate and rhythm including 6 beat run of Vtach. Patient curently in sinus tachycardia. MD ordered RN to again start patient on 30 mg of cardizem PO every 6 hours and to transition patient off cardizem drip.

## 2015-11-16 NOTE — Progress Notes (Addendum)
RN spoke with MD in person, Dr. Posey Pronto, during MD rounding. MD ordered patient to start 30 mg of oral cardizem every 6 hours (starting now) and to titrate off cardizem drip starting one hour after oral medication given. MD also ordered patient to move to 2A (telemetry floor).

## 2015-11-16 NOTE — Progress Notes (Signed)
[facility] Progress Note Patient Name: Kristi Greer DOB: 05-Sep-1950 MRN: [id number]   Date of Service  11/16/2015  HPI/Events of Note  Patient's serum magnesium1.6 & potassium 3.9. Troponin I 0.17. EKG reviewed and no obvious ischemia. Questionable ectopic atrial rhythm versus premature atrial beats. Heart rate less than 100 at this time.  eICU Interventions  1. Magnesium sulfate 3 g IV 2. Continuing to trend troponin I 3. Defer to primary service on cardiology consultation.     Intervention Category Major Interventions: Arrhythmia - evaluation and management;Electrolyte abnormality - evaluation and management  Tera Partridge 11/16/2015, 5:53 AM

## 2015-11-16 NOTE — Consult Note (Signed)
Cardiology Consultation Note  [name], MRN: [id number], DOB/AGE: 02-09-1951 65 y.o. Admit date: 11/14/2015   Date of Consult: 11/16/2015 Primary Physician: Einar Pheasant, MD Primary Cardiologist: Dr. Haroldine Laws, MD  Chief Complaint: ST and fever Reason for Consult: Tachycardia  HPI: 65 y.o. female with h/o chronic systolic CHF, asymptomatic paroxysmal SVT, obesity, migraines, and HTN who presented to [facility] on 3/19 with sore throat and fever and was found to have influenza A. Cardiology is consulted for tachycardia.   Patient was originally began feeling SOB in 05/2015. At that time she thought she had bronchitis. This was followed by an admission to [facility] in 08/2015 in which she was found to have acute systolic CHF in the setting of SVT with HR of 150. She received adenosine which was initally unsuccessful at breaking the rhythm. It did slow her rhythm which EKG looked like possible atrial tach. She eventually converted to NSR on her own. Echo at that time showed an EF of 35-40%. TSH was low at 0.18, but free T4 and free T3 were unremarkable. She was started on Tapazole as it was felt her symptoms 2/2 hyperthyroidism. She was subsequnetly seen by endocrinology who did not feel like it was hyperthyroidism and discontinued the Tapazole. On 10/11/2015 she underwent a Myoview that showed on EF of 30% without ischemia or scar. She was seen by Dr. Haroldine Laws on 11/11/2015 to establish care with [facility]. At that time she was scheduled for right and left cardiac cath to evaluate for ischemia given her multiple family members with CAD. She has also been scheduled for event monitor to evaluate for her burden of SVT.   She presented to [facility] on 3/19 with sore throat and fever. She was found to have influenza A. ECG as below, CXR showed cardiomegaly without acute findings. CT abdominal and pelvis showed no acute findings with a small hiatal hernia and diverticulosis. She was found to have  influenza A and has been febrile with a T max of 100. On telemetry she has been in what appears to be intermittent atrial tachycardia, sinus tach, and possible Afib with episodes sinus rhythm with blocked PACs. She is asymptomatic regarding her heart rate. She was started on a Cardizem gtt. Her heart rate has ranged in the low to 1-teens. She was briefly in NSR with a heart rate in the 80s.     Past Medical History  Diagnosis Date  . Hyperlipidemia   . Migraines   . Hypertension   . Allergy   . Personal history of colonic polyps   . Cancer ([place])     basal cell  . Congestive heart failure ([place])       Most Recent Cardiac Studies: Echo 09/01/2015: Study Conclusions  - Left ventricle: Systolic function was moderately reduced. The  estimated ejection fraction was in the range of 35% to 40%. - Aortic valve: Valve area (Vmax): 2.08 cm^2. - Mitral valve: There was moderate regurgitation.   Surgical History:  Past Surgical History  Procedure Laterality Date  . Tubal ligation    . Finger surgery    . Cholecystectomy  2006  . Vaginal hysterectomy      midurethral sling  . Hysteroscopy  2011, 10/2010     Home Meds: Prior to Admission medications   Medication Sig Start Date End Date Taking? Authorizing Provider  aspirin 81 MG tablet Take 81 mg by mouth daily.   Yes Historical Provider, MD  calcium carbonate (OS-CAL) 600 MG TABS Take 600  mg by mouth at bedtime.    Yes Historical Provider, MD  carvedilol (COREG) 6.25 MG tablet Take 1.5 tablets (9.375 mg total) by mouth 2 (two) times daily with a meal. 11/11/15  Yes Jolaine Artist, MD  cholecalciferol (VITAMIN D) 1000 UNITS tablet Take 2,000 Units by mouth at bedtime.    Yes Historical Provider, MD  fexofenadine (ALLEGRA) 180 MG tablet Take 180 mg by mouth at bedtime.    Yes Historical Provider, MD  furosemide (LASIX) 20 MG tablet Take 1 tablet (20 mg total) by mouth daily. 09/03/15  Yes Henreitta Leber, MD  hydroxypropyl methylcellulose  / hypromellose (ISOPTO TEARS / GONIOVISC) 2.5 % ophthalmic solution Place 1 drop into both eyes 2 (two) times daily.   Yes Historical Provider, MD  lisinopril (PRINIVIL,ZESTRIL) 5 MG tablet Take 5 mg by mouth at bedtime. Reported on 11/11/2015   Yes Historical Provider, MD  Olopatadine HCl (PAZEO) 0.7 % SOLN Place 1 drop into both eyes every morning.    Yes Historical Provider, MD  omeprazole (PRILOSEC) 20 MG capsule Take 1 capsule (20 mg total) by mouth 2 (two) times daily before a meal. 09/21/15  Yes Einar Pheasant, MD  simvastatin (ZOCOR) 10 MG tablet Take 10 mg by mouth at bedtime.   Yes Historical Provider, MD  spironolactone (ALDACTONE) 25 MG tablet Take 0.5 tablets (12.5 mg total) by mouth daily. 11/11/15  Yes Jolaine Artist, MD  SUMAtriptan (IMITREX) 100 MG tablet Take 100 mg by mouth every 2 (two) hours as needed for migraine. May repeat in 2 hours if headache persists or recurs.   Yes Historical Provider, MD  zonisamide (ZONEGRAN) 100 MG capsule Take 1 capsule (100 mg total) by mouth at bedtime. 12/02/14  Yes Einar Pheasant, MD    Inpatient Medications:  . aspirin EC  81 mg Oral Daily  . benzonatate  200 mg Oral TID  . cefTRIAXone (ROCEPHIN)  IV  1 g Intravenous Q24H  . enoxaparin (LOVENOX) injection  1 mg/kg Subcutaneous Q12H  . ipratropium  0.5 mg Nebulization Q4H  . levalbuterol  0.63 mg Nebulization Q4H  . loratadine  10 mg Oral Daily  . olopatadine  1 drop Both Eyes Daily  . oseltamivir  30 mg Oral BID  . pantoprazole  40 mg Oral Daily  . polyvinyl alcohol  1 drop Both Eyes BID  . potassium chloride  20 mEq Oral Once  . potassium phosphate IVPB (mmol)  20 mmol Intravenous Once  . simvastatin  10 mg Oral QHS  . sodium chloride  1,000 mL Intravenous Once  . zonisamide  100 mg Oral QHS   . diltiazem (CARDIZEM) infusion 5 mg/hr (11/16/15 0700)    Allergies: No Known Allergies  Social History   Social History  . Marital Status: Married    Spouse Name: N/A  . Number of  Children: 2  . Years of Education: N/A   Occupational History  . Not on file.   Social History Main Topics  . Smoking status: Never Smoker   . Smokeless tobacco: Never Used  . Alcohol Use: 0.0 oz/week    0 Standard drinks or equivalent per week     Comment: Socially  . Drug Use: No  . Sexual Activity: Not on file   Other Topics Concern  . Not on file   Social History Narrative     Family History  Problem Relation Age of Onset  . Hypertension Mother   . Stroke Mother   . Hypercholesterolemia Father   .  Diabetes Mother   . Heart disease Father     MI  . Hypercholesterolemia Father   . Hypertension Father   . Stroke Father   . Breast cancer Neg Hx   . Colon cancer Neg Hx      Review of Systems: Review of Systems  Constitutional: Positive for weight loss and malaise/fatigue. Negative for fever, chills and diaphoresis.  HENT: Positive for congestion and sore throat.   Eyes: Negative for discharge and redness.  Respiratory: Positive for cough, shortness of breath and wheezing. Negative for hemoptysis and sputum production.   Cardiovascular: Negative for chest pain, palpitations, orthopnea, claudication, leg swelling and PND.  Gastrointestinal: Negative for nausea, vomiting and abdominal pain.  Musculoskeletal: Positive for myalgias and joint pain. Negative for falls.  Skin: Negative for rash.  Neurological: Positive for weakness and headaches. Negative for dizziness, tingling, tremors and loss of consciousness.  Endo/Heme/Allergies: Does not bruise/bleed easily.  Psychiatric/Behavioral: Negative for substance abuse. The patient is not nervous/anxious.   All other systems reviewed and are negative.   Labs:  Recent Labs  11/14/15 2202 11/16/15 0455  TROPONINI <0.03 0.17*   Lab Results  Component Value Date   WBC 11.0 11/16/2015   HGB 11.8* 11/16/2015   HCT 36.5 11/16/2015   MCV 83.7 11/16/2015   PLT 131* 11/16/2015    Recent Labs Lab 11/14/15 2202   11/16/15 0455  NA 136  < > 136  K 4.0  < > 3.3*  CL 108  < > 112*  CO2 22  < > 20*  BUN 26*  < > 14  CREATININE 1.71*  < > 1.02*  CALCIUM 8.6*  < > 7.5*  PROT 7.0  --   --   BILITOT 0.6  --   --   ALKPHOS 90  --   --   ALT 21  --   --   AST 29  --   --   GLUCOSE 125*  < > 96  < > = values in this interval not displayed. Lab Results  Component Value Date   CHOL 161 04/05/2015   HDL 50.60 04/05/2015   LDLCALC 90 04/05/2015   TRIG 104.0 04/05/2015   No results found for: DDIMER  Radiology/Studies:  Ct Abdomen Pelvis Wo Contrast  11/14/2015  CLINICAL DATA:  Fever, hypotension, vomiting and diarrhea. Onset yesterday. EXAM: CT ABDOMEN AND PELVIS WITHOUT CONTRAST TECHNIQUE: Multidetector CT imaging of the abdomen and pelvis was performed following the standard protocol without IV contrast. COMPARISON:  10/27/2013 FINDINGS: There is cholecystectomy. There are unremarkable unenhanced appearances of the liver and bile ducts. There are unremarkable unenhanced appearances of the spleen, pancreas, adrenals and kidneys. Parapelvic cysts are incidentally noted about both kidneys, unchanged from 10/27/2013. Ureters and urinary bladder are unremarkable. There is a small hiatal hernia. Stomach is otherwise unremarkable. Small bowel is unremarkable. Appendix is normal. Colon is remarkable only for minimal uncomplicated diverticulosis. The abdominal aorta is normal in caliber. There is no atherosclerotic calcification. There is no adenopathy in the abdomen or pelvis. No acute inflammatory changes are evident in the abdomen or pelvis. There is no adenopathy. There is no ascites. There is prior hysterectomy. No adnexal abnormalities are evident. No significant abnormalities evident in the lower chest. There is mild unchanged linear basilar scarring. No significant skeletal lesion is evident. Moderate degenerative lumbar disc disease is present, greatest at L2-3. IMPRESSION: 1. No acute findings are evident in  the abdomen or pelvis. 2. Small hiatal hernia. 3. Diverticulosis.  Electronically Signed   By: Andreas Newport M.D.   On: 11/14/2015 23:30   Dg Chest Portable 1 View  11/14/2015  CLINICAL DATA:  Fever and hypotension, onset last night. EXAM: PORTABLE CHEST 1 VIEW COMPARISON:  08/31/2015 FINDINGS: There is mild unchanged cardiomegaly. The lungs are clear. There is no pleural effusion. The pulmonary vasculature is normal. IMPRESSION: Cardiomegaly.  No acute findings. Electronically Signed   By: Andreas Newport M.D.   On: 11/14/2015 23:14    EKG: sinus rhythm with blocked PACs, 98 bpm, low voltage, no acute st/t changes  Weights: Filed Weights   11/14/15 2154  Weight: 156 lb (70.761 kg)     Physical Exam: Blood pressure 100/30, pulse 103, temperature 100 F (37.8 C), temperature source Oral, resp. rate 18, height 5\' 1"  (1.549 m), weight 156 lb (70.761 kg), last menstrual period 10/18/2012, SpO2 100 %. Body mass index is 29.49 kg/(m^2). General: Well developed, well nourished, in no acute distress. Head: Normocephalic, atraumatic, sclera non-icteric, no xanthomas, nares are without discharge.  Neck: Negative for carotid bruits. JVD not elevated. Lungs: Coarse breath sounds right >left. Breathing is unlabored. Heart: Tachycardic and irregular, with S1 S2. No murmurs, rubs, or gallops appreciated. Abdomen: Soft, non-tender, non-distended with normoactive bowel sounds. No hepatomegaly. No rebound/guarding. No obvious abdominal masses. Msk:  Strength and tone appear normal for age. Extremities: No clubbing or cyanosis. No edema.  Distal pedal pulses are 2+ and equal bilaterally. Neuro: Alert and oriented X 3. No facial asymmetry. No focal deficit. Moves all extremities spontaneously. Psych:  Responds to questions appropriately with a normal affect.    Assessment and Plan:   1. Atrial ectopy/tach/possible Afib: -Possibly stress induced in the setting of her influenza -On Cardizem gtt. Her  soft BP precludes further titration  -Add PO amiodarone in an effort to decrease ectopy  2. Chronic systolic CHF: -She does not appear to be volume overloaded at this time -Planned right and left heart cath on 3/31 -Her heart failure certainly may be tachy-mediated. If in fact this is the case could consider EP evaluation for ablation  -Continue HF medications as BP allows  3. Influenza A: -Per IM -Watch for cardiac complications   Melvern Banker, PA-C Pager: [phone] 11/16/2015, 12:47 PM

## 2015-11-16 NOTE — Progress Notes (Signed)
MD found RN and ordered RN to discontinue order for oral cardizem and keep cardizem infusion going. MD still okay with transfer to telemetry floor.

## 2015-11-16 NOTE — Progress Notes (Signed)
[facility] Progress Note Patient Name: Kristi Greer DOB: 10-Feb-1951 MRN: [id number]   Date of Service  11/16/2015  HPI/Events of Note  Camera check on patient with supraventricular tachycardia. Telemetry has revealed  Sinus tachycardia at this time but previously did appear to have an ectopic atrial tachycardia. Patient appeared comfortable with stable blood pressure. Denied any dyspnea or chest pressure but did report some mild nausea with her tachycardia. Spoke with patient's bedside nurse reports she has been evaluated by Dr. Haroldine Laws previously.  Hospitalist had ordered a diltiazem infusion when her heart rate was in the 170s. There are no a.m. Labs at this time to evaluate her electrolytes.  eICU Interventions  1. Stat EKG 2. Stat renal panel, magnesium, & CBC 3. Holding on diltiazem at this time 4. Continue to monitor on telemetry. 5. Defer to primary service on cardiology consultation     Intervention Category Major Interventions: Arrhythmia - evaluation and management  Tera Partridge 11/16/2015, 4:47 AM

## 2015-11-16 NOTE — Progress Notes (Signed)
Spoke with MD about patient's temperature increasing over shift. MD ordered tylenol for fever of 100.5 or greater. MD also ordered chest x-ray for tomorrow.

## 2015-11-16 NOTE — Progress Notes (Signed)
[name], female   DOB: August 19, 1951, 65 y.o.   MRN: [id number] [facility] [place] NAME: Kristi Greer    MR#:  [id number]  DATE OF BIRTH:  [birth year]  SUBJECTIVE:   Came in with fevr, cough and bodyaches.  Feels weak with cough. Went into Afib last nite. On cardizem gtt REVIEW OF SYSTEMS:   Review of Systems  Constitutional: Positive for fever. Negative for chills and weight loss.  HENT: Negative for ear discharge, ear pain and nosebleeds.   Eyes: Negative for blurred vision, pain and discharge.  Respiratory: Positive for cough, sputum production and shortness of breath. Negative for wheezing and stridor.   Cardiovascular: Negative for chest pain, palpitations, orthopnea and PND.  Gastrointestinal: Negative for nausea, vomiting, abdominal pain and diarrhea.  Genitourinary: Negative for urgency and frequency.  Musculoskeletal: Positive for myalgias and back pain. Negative for joint pain.  Neurological: Positive for weakness. Negative for sensory change, speech change and focal weakness.  Psychiatric/Behavioral: Negative for depression and hallucinations. The patient is not nervous/anxious.   All other systems reviewed and are negative.  Tolerating Diet:yes Tolerating PT: pending  DRUG ALLERGIES:  No Known Allergies  VITALS:  Blood pressure 100/30, pulse 103, temperature 100.6 F (38.1 C), temperature source Oral, resp. rate 18, height 5\' 1"  (1.549 m), weight 70.761 kg (156 lb), last menstrual period 10/18/2012, SpO2 100 %.  PHYSICAL EXAMINATION:   Physical Exam  GENERAL:  65 y.o.-year-old patient lying in the bed with no acute distress. Appears ill EYES: Pupils equal, round, reactive to light and accommodation. No scleral icterus. Extraocular muscles intact.  HEENT: Head atraumatic, normocephalic. Oropharynx and nasopharynx clear.  NECK:  Supple, no jugular venous distention. No thyroid  enlargement, no tenderness.  LUNGS: coarse breath sounds bilaterally, no wheezing, rales, rhonchi. No use of accessory muscles of respiration.  CARDIOVASCULAR: S1, S2 normal. No murmurs, rubs, or gallops. Mild tachy ABDOMEN: Soft, nontender, nondistended. Bowel sounds present. No organomegaly or mass.  EXTREMITIES: No cyanosis, clubbing or edema b/l.    NEUROLOGIC: Cranial nerves II through XII are intact. No focal Motor or sensory deficits b/l.  gen weakness PSYCHIATRIC:  patient is alert and oriented x 3.  SKIN: No obvious rash, lesion, or ulcer.   LABORATORY PANEL:  CBC  Recent Labs Lab 11/16/15 0455  WBC 11.0  HGB 11.8*  HCT 36.5  PLT 131*    Chemistries   Recent Labs Lab 11/14/15 2202  11/16/15 0455  NA 136  < > 136  K 4.0  < > 3.3*  CL 108  < > 112*  CO2 22  < > 20*  GLUCOSE 125*  < > 96  BUN 26*  < > 14  CREATININE 1.71*  < > 1.02*  CALCIUM 8.6*  < > 7.5*  MG  --   --  1.6*  AST 29  --   --   ALT 21  --   --   ALKPHOS 90  --   --   BILITOT 0.6  --   --   < > = values in this interval not displayed. Cardiac Enzymes  Recent Labs Lab 11/16/15 0455  TROPONINI 0.17*   RADIOLOGY:  Ct Abdomen Pelvis Wo Contrast  11/14/2015  CLINICAL DATA:  Fever, hypotension, vomiting and diarrhea. Onset yesterday. EXAM: CT ABDOMEN AND PELVIS WITHOUT CONTRAST TECHNIQUE: Multidetector CT imaging of the abdomen and pelvis was performed following the standard protocol without IV contrast.  COMPARISON:  10/27/2013 FINDINGS: There is cholecystectomy. There are unremarkable unenhanced appearances of the liver and bile ducts. There are unremarkable unenhanced appearances of the spleen, pancreas, adrenals and kidneys. Parapelvic cysts are incidentally noted about both kidneys, unchanged from 10/27/2013. Ureters and urinary bladder are unremarkable. There is a small hiatal hernia. Stomach is otherwise unremarkable. Small bowel is unremarkable. Appendix is normal. Colon is remarkable only for  minimal uncomplicated diverticulosis. The abdominal aorta is normal in caliber. There is no atherosclerotic calcification. There is no adenopathy in the abdomen or pelvis. No acute inflammatory changes are evident in the abdomen or pelvis. There is no adenopathy. There is no ascites. There is prior hysterectomy. No adnexal abnormalities are evident. No significant abnormalities evident in the lower chest. There is mild unchanged linear basilar scarring. No significant skeletal lesion is evident. Moderate degenerative lumbar disc disease is present, greatest at L2-3. IMPRESSION: 1. No acute findings are evident in the abdomen or pelvis. 2. Small hiatal hernia. 3. Diverticulosis. Electronically Signed   By: Andreas Newport M.D.   On: 11/14/2015 23:30   Dg Chest Portable 1 View  11/14/2015  CLINICAL DATA:  Fever and hypotension, onset last night. EXAM: PORTABLE CHEST 1 VIEW COMPARISON:  08/31/2015 FINDINGS: There is mild unchanged cardiomegaly. The lungs are clear. There is no pleural effusion. The pulmonary vasculature is normal. IMPRESSION: Cardiomegaly.  No acute findings. Electronically Signed   By: Andreas Newport M.D.   On: 11/14/2015 23:14   ASSESSMENT AND PLAN:  Kristi Greer is a 65 y.o. female with a known history of Congestive heart failure with EF 35%, hyperlipidemia, migraine headache, hypertension and colonic polyps. She presents to the ED today due to shortness of breath for the last 1 day. She was in her normal state of health until 2 days ago when she developed generalized feeling of cold and yesterday, she developed sore throats which was followed by cough productive of yellowish sputum as well as fever, aches and pain all over her body.  1). Septic shock/urinary tract infection/poitive Influenza - Patient presented with a blood pressure of 63/38 and despite 4 L of normal saline, patient still has a systolic in the [id number]. Etiology of septic shock is likely due to urinary tract infection  as urinalysis is consistent with a urinary tract infection. - was on vancomycin and Zosyn---IV rocephin  - Continue IV fluids and now weaned offLevophed - Lactic acid is normal and troponins negative. - Hold all blood pressure medications at this time.  2). Influenza A - Patient presents with upper respiratory tract infection and has a contact was diagnosed with influenza. - Influenza A was positive - Patient has been started on Tamiflu and will be continued on DuoNeb, guaifenesin. - Continue droplet isolation.  3). Near-syncope/Volume depletion/acute kidney injury - Patient had an episode of near syncope and this is most likely related to volume depletion from inadequate oral intake as well as diarrhea nausea and vomiting in addition to septic shock - Creatinine is elevated at 1.71 from a baseline of 0.97. Patient has received a total of 4 L of normal saline and has been started on Levophed.. - Monitor creatinine and volume status closely. - Hold Lasix and Aldactone 4). Acute gastroenteritis - Etiology is unclear but most likely related to viral gastroneuritis. - C. difficile negative - Continue supportive management. Overall improving slowly. Will transfer out to floor once off pressors  5) Rapid afib with RVR Patient started on IV Cardizem drip. Heart rate is 100-106.  Patient is on Cardizem drip 5 mg. She is on treatment dose with Lovenox 1 parking twice a day. Dr. Nyoka Cowden. We'll switch to by mouth Cardizem since patient has sinus tach at present. If she flips back into atrial fibrillation will load her with amiodarone 400 mg twice a day.  CODE STATUS: full  DVT Prophylaxis: lovenox  TOTAL criticalTIME TAKING CARE OF THIS PATIENT: 35 minutes.  >50% time spent on counselling and coordination of care  POSSIBLE D/C IN 1-2 DAYS, DEPENDING ON CLINICAL CONDITION.  Note: This dictation was prepared with Dragon dictation along with smaller phrase technology. Any transcriptional errors  that result from this process are unintentional.  Deirdra Heumann M.D on 11/16/2015 at 3:15 PM  Between 7am to 6pm - Pager - [phone]  After 6pm go to [email or web address] - password [email or web address] [facility]  Office  [phone]  CC: Primary care physician; Einar Pheasant, MD

## 2015-11-16 NOTE — Progress Notes (Signed)
Spoke to MD, Dr. Posey Pronto, over the phone about patient's 11 beat run of Vtach. MD ordered cardizem discontinued and PO 400 mg amiodarone given twice a day starting now. MD ordered RN to discontinue cardizem infusion after PO amiodarone given.

## 2015-11-16 NOTE — Care Management (Signed)
Patient for transfer out to 2A today.  At present, CM has not identified any discharge needs.

## 2015-11-17 ENCOUNTER — Inpatient Hospital Stay: Payer: Managed Care, Other (non HMO)

## 2015-11-17 ENCOUNTER — Other Ambulatory Visit (HOSPITAL_COMMUNITY): Payer: Self-pay

## 2015-11-17 DIAGNOSIS — I5041 Acute combined systolic (congestive) and diastolic (congestive) heart failure: Secondary | ICD-10-CM

## 2015-11-17 DIAGNOSIS — A419 Sepsis, unspecified organism: Principal | ICD-10-CM

## 2015-11-17 DIAGNOSIS — R6521 Severe sepsis with septic shock: Secondary | ICD-10-CM

## 2015-11-17 DIAGNOSIS — J101 Influenza due to other identified influenza virus with other respiratory manifestations: Secondary | ICD-10-CM

## 2015-11-17 LAB — BASIC METABOLIC PANEL
ANION GAP: 5 (ref 5–15)
BUN: 9 mg/dL (ref 6–20)
CO2: 21 mmol/L — AB (ref 22–32)
Calcium: 8.4 mg/dL — ABNORMAL LOW (ref 8.9–10.3)
Chloride: 113 mmol/L — ABNORMAL HIGH (ref 101–111)
Creatinine, Ser: 0.81 mg/dL (ref 0.44–1.00)
GFR calc Af Amer: >60 mL/min (ref >60)
GFR calc non Af Amer: >60 mL/min (ref >60)
GLUCOSE: 104 mg/dL — AB (ref 65–99)
POTASSIUM: 4.2 mmol/L (ref 3.5–5.1)
Sodium: 139 mmol/L (ref 135–145)

## 2015-11-17 LAB — PHOSPHORUS: Phosphorus: 3.2 mg/dL (ref 2.5–4.6)

## 2015-11-17 LAB — MAGNESIUM: Magnesium: 1.9 mg/dL (ref 1.7–2.4)

## 2015-11-17 MED ORDER — LEVALBUTEROL HCL 0.63 MG/3ML IN NEBU: 0.6300 mg | INHALATION_SOLUTION | Freq: Four times a day (QID) | RESPIRATORY_TRACT | Status: DC | PRN

## 2015-11-17 MED ORDER — DIPHENHYDRAMINE HCL 25 MG PO CAPS
25.0000 mg | ORAL_CAPSULE | Freq: Every evening | ORAL | Status: DC | PRN
Start: 2015-11-17 — End: 2015-11-18
  Administered 2015-11-17: 25 mg via ORAL
  Filled 2015-11-17: qty 1

## 2015-11-17 MED ORDER — IPRATROPIUM BROMIDE 0.02 % IN SOLN: 0.5000 mg | RESPIRATORY_TRACT | Status: DC | PRN

## 2015-11-17 MED ORDER — BENZONATATE 100 MG PO CAPS: 100.0000 mg | ORAL_CAPSULE | Freq: Three times a day (TID) | ORAL | Status: DC | PRN

## 2015-11-17 MED ORDER — CEPHALEXIN 500 MG PO CAPS
500.0000 mg | ORAL_CAPSULE | Freq: Two times a day (BID) | ORAL | Status: DC
  Administered 2015-11-17 – 2015-11-18 (×3): 500 mg via ORAL
  Filled 2015-11-17 (×4): qty 1

## 2015-11-17 MED ORDER — DILTIAZEM HCL 30 MG PO TABS
30.0000 mg | ORAL_TABLET | Freq: Four times a day (QID) | ORAL | Status: DC
  Administered 2015-11-17 (×3): 30 mg via ORAL
  Filled 2015-11-17 (×4): qty 1

## 2015-11-17 MED ORDER — MAGNESIUM SULFATE 2 GM/50ML IV SOLN
2.0000 g | Freq: Once | INTRAVENOUS | Status: AC
  Administered 2015-11-17: 2 g via INTRAVENOUS
  Filled 2015-11-17: qty 50

## 2015-11-17 MED ORDER — HYDROCOD POLST-CPM POLST ER 10-8 MG/5ML PO SUER
5.0000 mL | Freq: Two times a day (BID) | ORAL | Status: DC | PRN
Start: 2015-11-17 — End: 2015-11-18
  Administered 2015-11-17: 5 mL via ORAL
  Filled 2015-11-17: qty 5

## 2015-11-17 MED ORDER — FUROSEMIDE 20 MG PO TABS
20.0000 mg | ORAL_TABLET | Freq: Every day | ORAL | Status: DC
  Administered 2015-11-17 – 2015-11-18 (×2): 20 mg via ORAL
  Filled 2015-11-17 (×2): qty 1

## 2015-11-17 NOTE — Progress Notes (Signed)
Patient moved to room 259 by wheelchair by this RN. Alert with no distress noted. Stach per cardiac monitor. 2A tele monitor applied to patient and verified with CCMD prior to moving patient from ICU to 2A. Patient's husband with patient in new room.

## 2015-11-17 NOTE — Progress Notes (Addendum)
Patient: Kristi Greer / Admit Date: 11/14/2015 / Date of Encounter: 11/17/2015, 12:19 PM   Subjective: Tachycardia   starting around midnight On-call cardiology called, diltiazem 30 mg every 6 hours was restarted Nurses report she had significant coughing overnight likely pushing her heart rate up Also had nebulizer treatments for bronchospasm No significant SVT or nonsustained VT in the past 24 hours on amiodarone   Review of Systems: Review of Systems  Constitutional: Positive for malaise/fatigue.  Respiratory: Positive for cough, shortness of breath and wheezing.   Cardiovascular: Positive for palpitations.  Gastrointestinal: Negative.   Musculoskeletal: Negative.   Neurological: Negative.   Psychiatric/Behavioral: Negative.   All other systems reviewed and are negative.  Objective: Telemetry:  sinus tachycardia, rate 110 this morning  Physical Exam: Blood pressure 94/56, pulse 86, temperature 98.3 F (36.8 C), temperature source Oral, resp. rate 25, height 5\' 1"  (1.549 m), weight 156 lb (70.761 kg), last menstrual period 10/18/2012, SpO2 100 %. Body mass index is 29.49 kg/(m^2). General: Well developed, well nourished, in no acute distress. Head: Normocephalic, atraumatic, sclera non-icteric, no xanthomas, nares are without discharge. Neck: Negative for carotid bruits. JVP not elevated. Lungs:  wheezes, rales, mildly labored breathing   Heart: RRR S1 S2 without murmurs, rubs, or gallops. , tachycardia Abdomen: Soft, non-tender, non-distended with normoactive bowel sounds. No rebound/guarding. Extremities: No clubbing or cyanosis. No edema. Distal pedal pulses are 2+ and equal bilaterally. Neuro: Alert and oriented X 3. Moves all extremities spontaneously. Psych:  Responds to questions appropriately with a normal affect.   Intake/Output Summary (Last 24 hours) at 11/17/15 1219 Last data filed at 11/17/15 1011  Gross per 24 hour  Intake 638.09 ml  Output    1000 ml  Net -361.91 ml    Inpatient Medications:  . amiodarone  400 mg Oral BID  . aspirin EC  81 mg Oral Daily  . benzonatate  200 mg Oral TID  . cefTRIAXone (ROCEPHIN)  IV  1 g Intravenous Q24H  . diltiazem  30 mg Oral 4 times per day  . enoxaparin (LOVENOX) injection  1 mg/kg Subcutaneous Q12H  . furosemide  20 mg Oral Daily  . loratadine  10 mg Oral Daily  . olopatadine  1 drop Both Eyes Daily  . oseltamivir  30 mg Oral BID  . pantoprazole  40 mg Oral Daily  . polyvinyl alcohol  1 drop Both Eyes BID  . simvastatin  10 mg Oral QHS  . sodium chloride  1,000 mL Intravenous Once  . zonisamide  100 mg Oral QHS   Infusions:    Labs:  Recent Labs  11/16/15 0455 11/17/15 0458  NA 136 139  K 3.3* 4.2  CL 112* 113*  CO2 20* 21*  GLUCOSE 96 104*  BUN 14 9  CREATININE 1.02* 0.81  CALCIUM 7.5* 8.4*  MG 1.6* 1.9  PHOS 1.8* 3.2    Recent Labs  11/14/15 2202 11/16/15 0455  AST 29  --   ALT 21  --   ALKPHOS 90  --   BILITOT 0.6  --   PROT 7.0  --   ALBUMIN 3.8 3.0*    Recent Labs  11/15/15 0319 11/16/15 0455  WBC 8.9 11.0  NEUTROABS  --  7.2*  HGB 12.0 11.8*  HCT 36.7 36.5  MCV 85.3 83.7  PLT 124* 131*    Recent Labs  11/14/15 2202 11/16/15 0455  TROPONINI <0.03 0.17*   Invalid input(s): POCBNP No results for input(s): HGBA1C  in the last 72 hours.   Weights: Filed Weights   11/14/15 2154  Weight: 156 lb (70.761 kg)     Radiology/Studies:  Ct Abdomen Pelvis Wo Contrast  11/14/2015  CLINICAL DATA:  Fever, hypotension, vomiting and diarrhea. Onset yesterday. EXAM: CT ABDOMEN AND PELVIS WITHOUT CONTRAST TECHNIQUE: Multidetector CT imaging of the abdomen and pelvis was performed following the standard protocol without IV contrast. COMPARISON:  10/27/2013 FINDINGS: There is cholecystectomy. There are unremarkable unenhanced appearances of the liver and bile ducts. There are unremarkable unenhanced appearances of the spleen, pancreas, adrenals and  kidneys. Parapelvic cysts are incidentally noted about both kidneys, unchanged from 10/27/2013. Ureters and urinary bladder are unremarkable. There is a small hiatal hernia. Stomach is otherwise unremarkable. Small bowel is unremarkable. Appendix is normal. Colon is remarkable only for minimal uncomplicated diverticulosis. The abdominal aorta is normal in caliber. There is no atherosclerotic calcification. There is no adenopathy in the abdomen or pelvis. No acute inflammatory changes are evident in the abdomen or pelvis. There is no adenopathy. There is no ascites. There is prior hysterectomy. No adnexal abnormalities are evident. No significant abnormalities evident in the lower chest. There is mild unchanged linear basilar scarring. No significant skeletal lesion is evident. Moderate degenerative lumbar disc disease is present, greatest at L2-3. IMPRESSION: 1. No acute findings are evident in the abdomen or pelvis. 2. Small hiatal hernia. 3. Diverticulosis. Electronically Signed   By: Andreas Newport M.D.   On: 11/14/2015 23:30   Dg Chest Port 1 View  11/17/2015  CLINICAL DATA:  Flu.  History of CHF. EXAM: PORTABLE CHEST 1 VIEW COMPARISON:  11/14/2015. FINDINGS: Mediastinum hilar structures are stable. Cardiomegaly with diffuse pulmonary interstitial prominence consistent with congestive heart failure. Interstitial pneumonitis cannot be excluded. Tiny pleural effusions. No pneumothorax. IMPRESSION: Cardiomegaly with new onset of pulmonary interstitial prominence bilaterally consistent with congestive heart failure. Tiny pleural effusions. Interstitial pneumonitis cannot be excluded. Electronically Signed   By: Marcello Moores  Register   On: 11/17/2015 07:17   Dg Chest Portable 1 View  11/14/2015  CLINICAL DATA:  Fever and hypotension, onset last night. EXAM: PORTABLE CHEST 1 VIEW COMPARISON:  08/31/2015 FINDINGS: There is mild unchanged cardiomegaly. The lungs are clear. There is no pleural effusion. The pulmonary  vasculature is normal. IMPRESSION: Cardiomegaly.  No acute findings. Electronically Signed   By: Andreas Newport M.D.   On: 11/14/2015 23:14     Assessment and Plan  65 y.o. female  Presenting with influenza a positive, SVT, heart rate up to 200 bpm  SVT Started on amiodarone yesterday for arrhythmia  No significant SVT on amiodarone  We'll continue amiodarone load for now, perhaps down to 200 mg twice a day in the next several days  In general is asymptomatic when she has SVT --Unclear if she has tachycardia mediated cardiomyopathy. Discuss with CHF clinic in [place], scheduled for 30 day monitor as an outpatient.  NSVT Likely secondary to cardiomyopathy in the setting of recent stressor, influenza, hypotension, rapid fluid boluses up to 6 L today for fluid resuscitation secondary to hypotension. On telemetry, no significant nonsustained VT overnight --- Amiodarone as above  Cardiomyopathy Suspected nonischemic though is scheduled for cardiac catheterization in 10 days time in [place] details sent to Dr. Haroldine Laws  Chronic systolic CHF Recommended she hold Lasix until her oral intake improves back to her baseline No other medication changes at this time, many of her medications on hold given hypotension  Influenza A Respiratory issues overnight, profound coughing  Likely contracted this from a colleague at work Supportive therapy, tamiflu?    Total encounter time more than 35 minutes  Greater than 50% was spent in counseling and coordination of care with the patient   Signed, Esmond Plants, MD, Ph.D. [facility] 11/17/2015, 12:19 PM

## 2015-11-17 NOTE — Care Management (Signed)
Barrier to discharge- issues with heart rate requiring changes in cardiac meds

## 2015-11-17 NOTE — Progress Notes (Signed)
[name], female   DOB: Apr 16, 1951, 65 y.o.   MRN: [id number] [place] [place] NAME: Kristi Greer    MR#:  [id number]  DATE OF BIRTH:  30-Mar-1951  SUBJECTIVE:   Came in with fevr, cough and bodyaches.  Feels weak with cough. Remains tachycardic Off IV cardizem gtt REVIEW OF SYSTEMS:   Review of Systems  Constitutional: Positive for fever. Negative for chills and weight loss.  HENT: Negative for ear discharge, ear pain and nosebleeds.   Eyes: Negative for blurred vision, pain and discharge.  Respiratory: Positive for cough, sputum production and shortness of breath. Negative for wheezing and stridor.   Cardiovascular: Negative for chest pain, palpitations, orthopnea and PND.  Gastrointestinal: Negative for nausea, vomiting, abdominal pain and diarrhea.  Genitourinary: Negative for urgency and frequency.  Musculoskeletal: Positive for myalgias and back pain. Negative for joint pain.  Neurological: Positive for weakness. Negative for sensory change, speech change and focal weakness.  Psychiatric/Behavioral: Negative for depression and hallucinations. The patient is not nervous/anxious.   All other systems reviewed and are negative.  Tolerating Diet:yes Tolerating PT: pending  DRUG ALLERGIES:  No Known Allergies  VITALS:  Blood pressure 119/78, pulse 86, temperature 98.6 F (37 C), temperature source Axillary, resp. rate 18, height 5\' 1"  (1.549 m), weight 70.761 kg (156 lb), last menstrual period 10/18/2012, SpO2 98 %.  PHYSICAL EXAMINATION:   Physical Exam  GENERAL:  65 y.o.-year-old patient lying in the bed with no acute distress. Appears ill EYES: Pupils equal, round, reactive to light and accommodation. No scleral icterus. Extraocular muscles intact.  HEENT: Head atraumatic, normocephalic. Oropharynx and nasopharynx clear.  NECK:  Supple, no jugular venous distention. No thyroid enlargement,  no tenderness.  LUNGS: coarse breath sounds bilaterally, no wheezing, rales, rhonchi. No use of accessory muscles of respiration.  CARDIOVASCULAR: S1, S2 normal. No murmurs, rubs, or gallops. Mild tachy ABDOMEN: Soft, nontender, nondistended. Bowel sounds present. No organomegaly or mass.  EXTREMITIES: No cyanosis, clubbing or edema b/l.    NEUROLOGIC: Cranial nerves II through XII are intact. No focal Motor or sensory deficits b/l.  gen weakness PSYCHIATRIC:  patient is alert and oriented x 3.  SKIN: No obvious rash, lesion, or ulcer.   LABORATORY PANEL:  CBC  Recent Labs Lab 11/16/15 0455  WBC 11.0  HGB 11.8*  HCT 36.5  PLT 131*    Chemistries   Recent Labs Lab 11/14/15 2202  11/17/15 0458  NA 136  < > 139  K 4.0  < > 4.2  CL 108  < > 113*  CO2 22  < > 21*  GLUCOSE 125*  < > 104*  BUN 26*  < > 9  CREATININE 1.71*  < > 0.81  CALCIUM 8.6*  < > 8.4*  MG  --   < > 1.9  AST 29  --   --   ALT 21  --   --   ALKPHOS 90  --   --   BILITOT 0.6  --   --   < > = values in this interval not displayed. Cardiac Enzymes  Recent Labs Lab 11/16/15 0455  TROPONINI 0.17*   RADIOLOGY:  Dg Chest Port 1 View  11/17/2015  CLINICAL DATA:  Flu.  History of CHF. EXAM: PORTABLE CHEST 1 VIEW COMPARISON:  11/14/2015. FINDINGS: Mediastinum hilar structures are stable. Cardiomegaly with diffuse pulmonary interstitial prominence consistent with congestive heart failure. Interstitial pneumonitis cannot be  excluded. Tiny pleural effusions. No pneumothorax. IMPRESSION: Cardiomegaly with new onset of pulmonary interstitial prominence bilaterally consistent with congestive heart failure. Tiny pleural effusions. Interstitial pneumonitis cannot be excluded. Electronically Signed   By: Marcello Moores  Register   On: 11/17/2015 07:17   ASSESSMENT AND PLAN:  Kristi Greer is a 65 y.o. female with a known history of Congestive heart failure with EF 35%, hyperlipidemia, migraine headache, hypertension and  colonic polyps. She presents to the ED today due to shortness of breath for the last 1 day. She was in her normal state of health until 2 days ago when she developed generalized feeling of cold and yesterday, she developed sore throats which was followed by cough productive of yellowish sputum as well as fever, aches and pain all over her body.  1). Septic shock/urinary tract infection/poitive Influenza - Patient presented with a blood pressure of 65/38 and despite 4 L of normal saline, patient still has a systolic in the [id number]. Etiology of septic shock is likely due to urinary tract infection as urinalysis is consistent with a urinary tract infection. - was on vancomycin and Zosyn---IV rocephin---po keflex  - recievewdIV fluids and now weaned offLevophed - Lactic acid is normal and troponins negative. - Hold all blood pressure medications at this time.  2). Influenza A - Patient presents with upper respiratory tract infection and has a contact was diagnosed with influenza. - Influenza A was positive - Patient has been on Tamiflu and will be continued on DuoNeb, guaifenesin. - Continue droplet isolation.  3). Near-syncope/Volume depletion/acute kidney injury - Patient had an episode of near syncope and this is most likely related to volume depletion from inadequate oral intake as well as diarrhea nausea and vomiting in addition to septic shock - Creatinine is elevated at 1.71 from a baseline of 0.97. Patient has received a total of 4 L of normal saline and has been started on Levophed.. - Monitor creatinine and volume status closely.  4). Acute gastroenteritis - Etiology is unclear but most likely related to viral gastroneuritis. - C. difficile negative - Continue supportive management. Overall improving slowly. Will transfer out to floor once off pressors  5) Rapid afib with RVR Patient started on IV Cardizem drip. Heart rate is 100-106. Patient was on Cardizem drip 5 mg. She is on  treatment dose with Lovenox 1mg /kg twice a day per. Dr. Rockey Situ.  -now on po cardizem and amiodarone 400 mg bid -CXR 3/21 shows congestion/chf will resume low dose lasix  CODE STATUS: full  DVT Prophylaxis: lovenox  TOTAL TIME TAKING CARE OF THIS PATIENT: 35 minutes.  >50% time spent on counselling and coordination of care with husband and pt  POSSIBLE D/C IN 1-2 DAYS, DEPENDING ON CLINICAL CONDITION.  Note: This dictation was prepared with Dragon dictation along with smaller phrase technology. Any transcriptional errors that result from this process are unintentional.  Kristi Greer M.D on 11/17/2015 at 2:37 PM  Between 7am to 6pm - Pager - [phone]  After 6pm go to [email or web address] - password [email or web address] [facility]  Office  [phone]  CC: Primary care physician; Einar Pheasant, MD

## 2015-11-17 NOTE — Progress Notes (Signed)
Report called to Tammy, RN on 2A.   

## 2015-11-17 NOTE — Progress Notes (Signed)
Cardiologist on call paged about Pt's HR remaining in 120-130's and short 5 beat run of Vtach earlier. Pt had a coughing episode earlier, PRN Robitussin was given, Pt has remained tachycardic since. Dr. Bosie Helper ordered PO cardizem 30mg  Q6hr to be restarted and the first dose given now. Will continue to monitor.

## 2015-11-17 NOTE — Progress Notes (Signed)
Pharmacy ICU Daily Progress Note  Kristi Greer is a 65yo female admitted 11/14/15 for hypotension 2/2 prerenal dehydration from N/V stemming form influenza and severe sepsis.  PMH: CHF with EF 35%   Active pharmacy consults: electrolyte management  Plan: 1. 3/22 K 4.2, Mag 1.9, Phos 3.2. In order to achieve Mag >2, will initiate Magnesium IV 2g x1.  Will recheck levels with AM labs.  Infectious:  Antimicrobials:  Ceftriaxone 3/20>> Vancomycin 3/19>> 3/20 Zosyn 3/19>> 3/20 Oseltaminivir 30mg  PO BID started 3/19 WBC 8.9 (11.3) Tmax 100 Culture Results: BCID GI panel (-) MRSA PCR (-) CDiff (-) 3/19 BCx x2 NGTD Influenza B (+)  Electrolytes:  Potassium: 3.3 Magnesium: 1.6 Phosphorus: 1.8 Supplementation plans: none  Pulmonary: Bronchodilators? duonebs q4hrs Ventilator status: no O2 sat/FiO2: 95 on RA  Current steroids: none Taper plans:   GI:  Constipation P/Px: none (previously having diarrhea) Feeding status: heart healthy, thin liquids LBM: 3/22 SUP: pantoprazole 40mg  PO daily  Insulin:  SSI use in 24hrs: none Current Insulin orders: none Last 3 CBGs: 104, 96, 129  DVT PPx: Pt is now on full-dose lovenox (70mg  q12hrs) due to runs of Afib last night.   Sedation+Pain: no pain orders RASS goal? 0 GCS 15 Last pain score: 0 Opioid use in last 24 hrs:   Pressors: none BPs 93/66  Medication education/counseling required? No

## 2015-11-18 ENCOUNTER — Telehealth: Payer: Self-pay

## 2015-11-18 ENCOUNTER — Other Ambulatory Visit: Payer: Managed Care, Other (non HMO)

## 2015-11-18 LAB — BASIC METABOLIC PANEL
ANION GAP: 6 (ref 5–15)
BUN: 14 mg/dL (ref 6–20)
CALCIUM: 8.4 mg/dL — AB (ref 8.9–10.3)
CO2: 25 mmol/L (ref 22–32)
Chloride: 109 mmol/L (ref 101–111)
Creatinine, Ser: 0.91 mg/dL (ref 0.44–1.00)
GFR calc Af Amer: >60 mL/min (ref >60)
GLUCOSE: 111 mg/dL — AB (ref 65–99)
Potassium: 4 mmol/L (ref 3.5–5.1)
SODIUM: 140 mmol/L (ref 135–145)

## 2015-11-18 LAB — MAGNESIUM: Magnesium: 2.4 mg/dL (ref 1.7–2.4)

## 2015-11-18 MED ORDER — AMIODARONE HCL 200 MG PO TABS
200.0000 mg | ORAL_TABLET | Freq: Two times a day (BID) | ORAL | Status: DC
  Administered 2015-11-18: 200 mg via ORAL
  Filled 2015-11-18: qty 1

## 2015-11-18 MED ORDER — HYDROCOD POLST-CPM POLST ER 10-8 MG/5ML PO SUER: 5.0000 mL | Freq: Two times a day (BID) | ORAL | Status: DC | PRN

## 2015-11-18 MED ORDER — CEPHALEXIN 500 MG PO CAPS: 500.0000 mg | ORAL_CAPSULE | Freq: Two times a day (BID) | ORAL | Status: DC

## 2015-11-18 MED ORDER — OSELTAMIVIR PHOSPHATE 30 MG PO CAPS: 30.0000 mg | ORAL_CAPSULE | Freq: Two times a day (BID) | ORAL | Status: DC

## 2015-11-18 MED ORDER — BENZONATATE 100 MG PO CAPS: 100.0000 mg | ORAL_CAPSULE | Freq: Three times a day (TID) | ORAL | Status: DC | PRN

## 2015-11-18 NOTE — Telephone Encounter (Signed)
Attempted to contact pt regarding discharge from  [facility] on 11/18/15. Left message asking pt to call back regarding discharge instructions and/or medications. Advised pt of appt w/ Dr. Yvone Neu on 11/27/15 at 9:30 w/ [facility]. Asked pt to call back if unable to keep this appt.

## 2015-11-18 NOTE — Progress Notes (Signed)
Patient d/c'd home. Education provided, no questions at this time. Patient picked up by husband. Telemetry removed. Ranard Harte R [place]  

## 2015-11-18 NOTE — Discharge Summary (Signed)
[place] [facility] [place] NAME: Kristi Greer    MR#:  [id number]  DATE OF BIRTH:  1951-08-28  DATE OF ADMISSION:  11/14/2015 ADMITTING PHYSICIAN: Saundra Shelling, MD  DATE OF DISCHARGE: 11/18/2015  PRIMARY CARE PHYSICIAN: Einar Pheasant, MD    ADMISSION DIAGNOSIS:  Thrush [B37.0] Acute renal insufficiency [N28.9] Acute urinary tract infection [N39.0] Influenza [J11.1] Septic shock ([place]) [A41.9, R65.21] Sepsis, due to unspecified organism ([place]) [A41.9]  DISCHARGE DIAGNOSIS:  Sepsis resolved UTI A. fib with RVR resolved Influenza A Acute gastroenteritis resolved  SECONDARY DIAGNOSIS:   Past Medical History  Diagnosis Date  . Hyperlipidemia   . Migraines   . Hypertension   . Allergy   . Personal history of colonic polyps   . Cancer ([place])     basal cell  . Congestive heart failure [facility])     HOSPITAL COURSE:  Kristi Greer is a 65 y.o. female with a known history of Congestive heart failure with EF 35%, hyperlipidemia, migraine headache, hypertension and colonic polyps. She presents to the ED today due to shortness of breath for the last 1 day. She was in her normal state of health until 2 days ago when she developed generalized feeling of cold and yesterday, she developed sore throats which was followed by cough productive of yellowish sputum as well as fever, aches and pain all over her body.  1). Septic shock/urinary tract infection/poitive Influenza - Patient presented with a blood pressure of 65/38 and despite 4 L of normal saline, patient still has a systolic in the [id number]. Etiology of septic shock is likely due to urinary tract infection as urinalysis is consistent with a urinary tract infection. - was on vancomycin and Zosyn---IV rocephin---po keflex - recievewdIV fluids and  weaned offLevophed - Lactic acid is normal and troponins negative.  2). Influenza A - Patient presents with upper respiratory tract  infection and has a contact was diagnosed with influenza. - Influenza A was positive - Patient has been on Tamiflu (total 5 days) -prn DuoNeb, guaifenesin. - Continued droplet isolation.  3). Near-syncope/Volume depletion/acute kidney injury - Patient had an episode of near syncope and this is most likely related to volume depletion from inadequate oral intake as well as diarrhea nausea and vomiting in addition to septic shock - Creatinine is elevated at 1.71 from a baseline of 0.97. Patient has received a total of 4 L of normal saline and has been started on Levophed.. -resolved. BP stable  4). Acute gastroenteritis - Etiology is unclear but most likely related to viral gastroneuritis. - C. difficile negative - Continue supportive management.  5) Rapid afib with RVR-transient -resolved Patient  Was on IV Cardizem drip. Heart rate is 100-106. -hr in the 80's. Will d/c Cardizem since bp on lower side (pt asymptomatic) -amiodarone 400 mg bid---po 200 mg bid which can be weaned to off as outpt -pt to f/u with Dr Sung Amabile next week -CXR 3/21 shows congestion/chf will resume low dose lasix (home)  CODE STATUS: full  DVT Prophylaxis: lovenox  Overall stable d/c home  CONSULTS OBTAINED:  Treatment Team:  Isaias Cowman, MD Minna Merritts, MD  DRUG ALLERGIES:  No Known Allergies  DISCHARGE MEDICATIONS:   Current Discharge Medication List    START taking these medications   Details  benzonatate (TESSALON) 100 MG capsule Take 1 capsule (100 mg total) by mouth 3 (three) times daily as needed for cough. Qty: 20 capsule, Refills: 0    cephALEXin (  KEFLEX) 500 MG capsule Take 1 capsule (500 mg total) by mouth every 12 (twelve) hours. Qty: 6 capsule, Refills: 0    chlorpheniramine-HYDROcodone (TUSSIONEX) 10-8 MG/5ML SUER Take 5 mLs by mouth every 12 (twelve) hours as needed for cough. Qty: 140 mL, Refills: 0    oseltamivir (TAMIFLU) 30 MG capsule Take 1 capsule (30 mg  total) by mouth 2 (two) times daily. Qty: 6 capsule, Refills: 0      CONTINUE these medications which have NOT CHANGED   Details  aspirin 81 MG tablet Take 81 mg by mouth daily.    calcium carbonate (OS-CAL) 600 MG TABS Take 600 mg by mouth at bedtime.     carvedilol (COREG) 6.25 MG tablet Take 1.5 tablets (9.375 mg total) by mouth 2 (two) times daily with a meal. Qty: 90 tablet, Refills: 6    cholecalciferol (VITAMIN D) 1000 UNITS tablet Take 2,000 Units by mouth at bedtime.     fexofenadine (ALLEGRA) 180 MG tablet Take 180 mg by mouth at bedtime.     furosemide (LASIX) 20 MG tablet Take 1 tablet (20 mg total) by mouth daily. Qty: 60 tablet, Refills: 1    hydroxypropyl methylcellulose / hypromellose (ISOPTO TEARS / GONIOVISC) 2.5 % ophthalmic solution Place 1 drop into both eyes 2 (two) times daily.    lisinopril (PRINIVIL,ZESTRIL) 5 MG tablet Take 5 mg by mouth at bedtime. Reported on 11/11/2015    Olopatadine HCl (PAZEO) 0.7 % SOLN Place 1 drop into both eyes every morning.     omeprazole (PRILOSEC) 20 MG capsule Take 1 capsule (20 mg total) by mouth 2 (two) times daily before a meal. Qty: 60 capsule, Refills: 3    simvastatin (ZOCOR) 10 MG tablet Take 10 mg by mouth at bedtime.    spironolactone (ALDACTONE) 25 MG tablet Take 0.5 tablets (12.5 mg total) by mouth daily. Qty: 45 tablet, Refills: 3    SUMAtriptan (IMITREX) 100 MG tablet Take 100 mg by mouth every 2 (two) hours as needed for migraine. May repeat in 2 hours if headache persists or recurs.    zonisamide (ZONEGRAN) 100 MG capsule Take 1 capsule (100 mg total) by mouth at bedtime. Qty: 30 capsule, Refills: 1        If you experience worsening of your admission symptoms, develop shortness of breath, life threatening emergency, suicidal or homicidal thoughts you must seek medical attention immediately by calling 911 or calling your MD immediately  if symptoms less severe.  You Must read complete  instructions/literature along with all the possible adverse reactions/side effects for all the Medicines you take and that have been prescribed to you. Take any new Medicines after you have completely understood and accept all the possible adverse reactions/side effects.   Please note  You were cared for by a hospitalist during your hospital stay. If you have any questions about your discharge medications or the care you received while you were in the hospital after you are discharged, you can call the unit and asked to speak with the hospitalist on call if the hospitalist that took care of you is not available. Once you are discharged, your primary care physician will handle any further medical issues. Please note that NO REFILLS for any discharge medications will be authorized once you are discharged, as it is imperative that you return to your primary care physician (or establish a relationship with a primary care physician if you do not have one) for your aftercare needs so that they can reassess  your need for medications and monitor your lab values. Today   SUBJECTIVE   Feels a whole lot better. Slept good. Cough better  VITAL SIGNS:  Blood pressure 93/58, pulse 87, temperature 97.5 F (36.4 C), temperature source Oral, resp. rate 17, height 5\' 1"  (1.549 m), weight 70.761 kg (156 lb), last menstrual period 10/18/2012, SpO2 98 %.  I/O:   Intake/Output Summary (Last 24 hours) at 11/18/15 0830 Last data filed at 11/17/15 1824  Gross per 24 hour  Intake    770 ml  Output      0 ml  Net    770 ml    PHYSICAL EXAMINATION:  GENERAL:  65 y.o.-year-old patient lying in the bed with no acute distress.  EYES: Pupils equal, round, reactive to light and accommodation. No scleral icterus. Extraocular muscles intact.  HEENT: Head atraumatic, normocephalic. Oropharynx and nasopharynx clear.  NECK:  Supple, no jugular venous distention. No thyroid enlargement, no tenderness.  LUNGS: Normal breath  sounds bilaterally, no wheezing, rales,rhonchi or crepitation. No use of accessory muscles of respiration.  CARDIOVASCULAR: S1, S2 normal. No murmurs, rubs, or gallops.  ABDOMEN: Soft, non-tender, non-distended. Bowel sounds present. No organomegaly or mass.  EXTREMITIES: No pedal edema, cyanosis, or clubbing.  NEUROLOGIC: Cranial nerves II through XII are intact. Muscle strength 5/5 in all extremities. Sensation intact. Gait not checked.  PSYCHIATRIC: The patient is alert and oriented x 3.  SKIN: No obvious rash, lesion, or ulcer.   DATA REVIEW:   CBC   Recent Labs Lab 11/16/15 0455  WBC 11.0  HGB 11.8*  HCT 36.5  PLT 131*    Chemistries   Recent Labs Lab 11/14/15 2202  11/18/15 0623  NA 136  < > 140  K 4.0  < > 4.0  CL 108  < > 109  CO2 22  < > 25  GLUCOSE 125*  < > 111*  BUN 26*  < > 14  CREATININE 1.71*  < > 0.91  CALCIUM 8.6*  < > 8.4*  MG  --   < > 2.4  AST 29  --   --   ALT 21  --   --   ALKPHOS 90  --   --   BILITOT 0.6  --   --   < > = values in this interval not displayed.  Microbiology Results   Recent Results (from the past 240 hour(s))  Culture, blood (routine x 2)     Status: None (Preliminary result)   Collection Time: 11/14/15 10:02 PM  Result Value Ref Range Status   Specimen Description BLOOD RIGHT ASSIST CONTROL  Final   Special Requests BOTTLES DRAWN AEROBIC AND ANAEROBIC [place]  Final   Culture NO GROWTH 3 DAYS  Final   Report Status PENDING  Incomplete  Culture, blood (routine x 2)     Status: None (Preliminary result)   Collection Time: 11/14/15 10:02 PM  Result Value Ref Range Status   Specimen Description BLOOD LEFT HAND  Final   Special Requests BOTTLES DRAWN AEROBIC AND ANAEROBIC [place]  Final   Culture NO GROWTH 3 DAYS  Final   Report Status PENDING  Incomplete  Rapid Influenza A&B Antigens ([place] only)     Status: Abnormal   Collection Time: 11/14/15 10:08 PM  Result Value Ref Range Status   Influenza A ([facility])  NEGATIVE NEGATIVE Final   Influenza B ([facility]) POSITIVE (A) NEGATIVE Final  MRSA PCR Screening     Status: None   Collection  Time: 11/15/15  2:28 AM  Result Value Ref Range Status   MRSA by PCR NEGATIVE NEGATIVE Final    Comment:        The GeneXpert MRSA Assay (FDA approved for NASAL specimens only), is one component of a comprehensive MRSA colonization surveillance program. It is not intended to diagnose MRSA infection nor to guide or monitor treatment for MRSA infections.   C difficile quick scan w PCR reflex     Status: None   Collection Time: 11/15/15  2:32 AM  Result Value Ref Range Status   C Diff antigen NEGATIVE NEGATIVE Final   C Diff toxin NEGATIVE NEGATIVE Final   C Diff interpretation Negative for C. difficile  Final  Gastrointestinal Panel by PCR , Stool     Status: None   Collection Time: 11/15/15  2:32 AM  Result Value Ref Range Status   Campylobacter species NOT DETECTED NOT DETECTED Final   Plesimonas shigelloides NOT DETECTED NOT DETECTED Final   Salmonella species NOT DETECTED NOT DETECTED Final   Yersinia enterocolitica NOT DETECTED NOT DETECTED Final   Vibrio species NOT DETECTED NOT DETECTED Final   Vibrio cholerae NOT DETECTED NOT DETECTED Final   Enteroaggregative E coli (EAEC) NOT DETECTED NOT DETECTED Final   Enteropathogenic E coli (EPEC) NOT DETECTED NOT DETECTED Final   Enterotoxigenic E coli (ETEC) NOT DETECTED NOT DETECTED Final   Shiga like toxin producing E coli (STEC) NOT DETECTED NOT DETECTED Final   E. coli O157 NOT DETECTED NOT DETECTED Final   Shigella/Enteroinvasive E coli (EIEC) NOT DETECTED NOT DETECTED Final   Cryptosporidium NOT DETECTED NOT DETECTED Final   Cyclospora cayetanensis NOT DETECTED NOT DETECTED Final   Entamoeba histolytica NOT DETECTED NOT DETECTED Final   Giardia lamblia NOT DETECTED NOT DETECTED Final   Adenovirus F40/41 NOT DETECTED NOT DETECTED Final   Astrovirus NOT DETECTED NOT DETECTED Final   Norovirus GI/GII  NOT DETECTED NOT DETECTED Final   Rotavirus A NOT DETECTED NOT DETECTED Final   Sapovirus (I, II, IV, and V) NOT DETECTED NOT DETECTED Final  Urine culture     Status: None   Collection Time: 11/15/15  5:30 AM  Result Value Ref Range Status   Specimen Description URINE, CLEAN CATCH  Final   Special Requests Normal  Final   Culture INSIGNIFICANT GROWTH  Final   Report Status 11/16/2015 FINAL  Final    RADIOLOGY:  Dg Chest Port 1 View  11/17/2015  CLINICAL DATA:  Flu.  History of CHF. EXAM: PORTABLE CHEST 1 VIEW COMPARISON:  11/14/2015. FINDINGS: Mediastinum hilar structures are stable. Cardiomegaly with diffuse pulmonary interstitial prominence consistent with congestive heart failure. Interstitial pneumonitis cannot be excluded. Tiny pleural effusions. No pneumothorax. IMPRESSION: Cardiomegaly with new onset of pulmonary interstitial prominence bilaterally consistent with congestive heart failure. Tiny pleural effusions. Interstitial pneumonitis cannot be excluded. Electronically Signed   By: Marcello Moores  Register   On: 11/17/2015 07:17     Management plans discussed with the patient, family and they are in agreement.  CODE STATUS:     Code Status Orders        Start     Ordered   11/15/15 0316  Full code   Continuous     11/15/15 0315    Code Status History    Date Active Date Inactive Code Status Order ID Comments User Context   09/01/2015 12:05 AM 09/03/2015  2:24 PM Full Code [id number]  Lytle Butte, MD ED  TOTAL TIME TAKING CARE OF THIS PATIENT: 40 minutes.    Kristi Greer M.D on 11/18/2015 at 8:30 AM  Between 7am to 6pm - Pager - [phone] After 6pm go to [email or web address] - password [email or web address] [place]  Office  [phone]  CC: Primary care physician; Einar Pheasant, MD

## 2015-11-18 NOTE — Progress Notes (Signed)
Pharmacy  Electrolyte Consult  Note  Kristi Greer is a 65yo female admitted 11/14/15 for hypotension 2/2 prerenal dehydration from N/V stemming form influenza and severe sepsis.   Active pharmacy consults: electrolyte management  0322:  Na: 139  K: 4.2   Mg: 1.9 0323:  Na: 140  K: 4.0    Mg: 2.4   Plan: Labs WNL today. No supplementation needed. Patient scheduled for D/C home today   Nancy Fetter, PharmD Pharmacy Resident 11/18/2015 10:04 AM

## 2015-11-18 NOTE — Telephone Encounter (Signed)
-----   Message from Blain Pais sent at 11/18/2015  9:43 AM EDT ----- Regarding: tcm/ph Dr. Yvone Neu 4/10, 9:30

## 2015-11-18 NOTE — Care Management (Signed)
For discharge today.  No discharge needs identified by members of the care team

## 2015-11-19 ENCOUNTER — Telehealth (HOSPITAL_COMMUNITY): Payer: Self-pay | Admitting: *Deleted

## 2015-11-19 NOTE — Telephone Encounter (Signed)
Pre cert approved for heart cath 11/18/15-02/16/16 [id number]

## 2015-11-19 NOTE — Telephone Encounter (Signed)
Open in error

## 2015-11-20 LAB — CULTURE, BLOOD (ROUTINE X 2)
CULTURE: NO GROWTH
Culture: NO GROWTH

## 2015-11-25 ENCOUNTER — Other Ambulatory Visit (INDEPENDENT_AMBULATORY_CARE_PROVIDER_SITE_OTHER): Payer: Managed Care, Other (non HMO)

## 2015-11-25 DIAGNOSIS — E7800 Pure hypercholesterolemia, unspecified: Secondary | ICD-10-CM | POA: Diagnosis not present

## 2015-11-25 DIAGNOSIS — R946 Abnormal results of thyroid function studies: Secondary | ICD-10-CM | POA: Diagnosis not present

## 2015-11-25 DIAGNOSIS — I517 Cardiomegaly: Secondary | ICD-10-CM

## 2015-11-25 DIAGNOSIS — R7989 Other specified abnormal findings of blood chemistry: Secondary | ICD-10-CM

## 2015-11-25 DIAGNOSIS — I5022 Chronic systolic (congestive) heart failure: Secondary | ICD-10-CM

## 2015-11-25 LAB — CBC WITH DIFFERENTIAL/PLATELET
BASOS ABS: 0 10*3/uL (ref 0.0–0.1)
Basophils Relative: 0.4 % (ref 0.0–3.0)
Eosinophils Absolute: 0.3 10*3/uL (ref 0.0–0.7)
Eosinophils Relative: 2.4 % (ref 0.0–5.0)
HEMATOCRIT: 41.2 % (ref 36.0–46.0)
Hemoglobin: 13.5 g/dL (ref 12.0–15.0)
LYMPHS PCT: 16.8 % (ref 12.0–46.0)
Lymphs Abs: 2.1 10*3/uL (ref 0.7–4.0)
MCHC: 32.8 g/dL (ref 30.0–36.0)
MCV: 83.7 fl (ref 78.0–100.0)
MONOS PCT: 6.2 % (ref 3.0–12.0)
Monocytes Absolute: 0.8 10*3/uL (ref 0.1–1.0)
NEUTROS PCT: 74.2 % (ref 43.0–77.0)
Neutro Abs: 9.4 10*3/uL — ABNORMAL HIGH (ref 1.4–7.7)
Platelets: 317 10*3/uL (ref 150.0–400.0)
RBC: 4.92 Mil/uL (ref 3.87–5.11)
RDW: 14.9 % (ref 11.5–15.5)
WBC: 12.6 10*3/uL — AB (ref 4.0–10.5)

## 2015-11-25 LAB — LIPID PANEL
CHOLESTEROL: 161 mg/dL (ref 0–200)
HDL: 38.5 mg/dL — ABNORMAL LOW (ref >39.00)
LDL CALC: 90 mg/dL (ref 0–99)
NONHDL: 122.66
Total CHOL/HDL Ratio: 4
Triglycerides: 161 mg/dL — ABNORMAL HIGH (ref 0.0–149.0)
VLDL: 32.2 mg/dL (ref 0.0–40.0)

## 2015-11-25 LAB — BASIC METABOLIC PANEL
BUN: 20 mg/dL (ref 6–23)
CALCIUM: 9.6 mg/dL (ref 8.4–10.5)
CO2: 28 mEq/L (ref 19–32)
Chloride: 104 mEq/L (ref 96–112)
Creatinine, Ser: 1.01 mg/dL (ref 0.40–1.20)
GFR: 58.85 mL/min — AB (ref >60.00)
Glucose, Bld: 99 mg/dL (ref 70–99)
Potassium: 4.7 mEq/L (ref 3.5–5.1)
SODIUM: 140 meq/L (ref 135–145)

## 2015-11-25 LAB — T4, FREE: Free T4: 0.91 ng/dL (ref 0.60–1.60)

## 2015-11-25 LAB — HEPATIC FUNCTION PANEL
ALT: 18 U/L (ref 0–35)
AST: 19 U/L (ref 0–37)
Albumin: 4 g/dL (ref 3.5–5.2)
Alkaline Phosphatase: 89 U/L (ref 39–117)
BILIRUBIN DIRECT: 0.1 mg/dL (ref 0.0–0.3)
BILIRUBIN TOTAL: 0.5 mg/dL (ref 0.2–1.2)
Total Protein: 6.6 g/dL (ref 6.0–8.3)

## 2015-11-25 LAB — T3, FREE: T3, Free: 3.4 pg/mL (ref 2.3–4.2)

## 2015-11-25 LAB — TSH: TSH: 0.46 u[IU]/mL (ref 0.35–4.50)

## 2015-11-26 ENCOUNTER — Encounter (HOSPITAL_COMMUNITY): Payer: Self-pay | Admitting: Internal Medicine

## 2015-11-26 ENCOUNTER — Other Ambulatory Visit (HOSPITAL_COMMUNITY): Payer: Self-pay | Admitting: Internal Medicine

## 2015-11-26 ENCOUNTER — Ambulatory Visit (HOSPITAL_COMMUNITY)
Admission: RE | Admit: 2015-11-26 | Discharge: 2015-11-26 | Disposition: A | Discharge status: Home / Self Care | Payer: Managed Care, Other (non HMO) | Source: Ambulatory Visit | Attending: Internal Medicine | Admitting: Internal Medicine

## 2015-11-26 ENCOUNTER — Encounter (HOSPITAL_COMMUNITY)
Admission: RE | Disposition: A | Discharge status: Home / Self Care | Payer: Self-pay | Source: Ambulatory Visit | Attending: Internal Medicine

## 2015-11-26 ENCOUNTER — Ambulatory Visit: Payer: Managed Care, Other (non HMO)

## 2015-11-26 ENCOUNTER — Ambulatory Visit: Payer: Managed Care, Other (non HMO) | Admitting: Family

## 2015-11-26 DIAGNOSIS — I5021 Acute systolic (congestive) heart failure: Secondary | ICD-10-CM | POA: Diagnosis not present

## 2015-11-26 DIAGNOSIS — E785 Hyperlipidemia, unspecified: Secondary | ICD-10-CM | POA: Diagnosis not present

## 2015-11-26 DIAGNOSIS — G43909 Migraine, unspecified, not intractable, without status migrainosus: Secondary | ICD-10-CM | POA: Insufficient documentation

## 2015-11-26 DIAGNOSIS — I429 Cardiomyopathy, unspecified: Secondary | ICD-10-CM | POA: Diagnosis not present

## 2015-11-26 DIAGNOSIS — Z8249 Family history of ischemic heart disease and other diseases of the circulatory system: Secondary | ICD-10-CM | POA: Diagnosis not present

## 2015-11-26 DIAGNOSIS — I110 Hypertensive heart disease with heart failure: Secondary | ICD-10-CM | POA: Insufficient documentation

## 2015-11-26 DIAGNOSIS — I471 Supraventricular tachycardia: Secondary | ICD-10-CM | POA: Diagnosis not present

## 2015-11-26 DIAGNOSIS — E669 Obesity, unspecified: Secondary | ICD-10-CM | POA: Diagnosis not present

## 2015-11-26 DIAGNOSIS — I5022 Chronic systolic (congestive) heart failure: Secondary | ICD-10-CM

## 2015-11-26 DIAGNOSIS — Z6828 Body mass index (BMI) 28.0-28.9, adult: Secondary | ICD-10-CM | POA: Insufficient documentation

## 2015-11-26 DIAGNOSIS — I5041 Acute combined systolic (congestive) and diastolic (congestive) heart failure: Secondary | ICD-10-CM

## 2015-11-26 DIAGNOSIS — Z7982 Long term (current) use of aspirin: Secondary | ICD-10-CM | POA: Diagnosis not present

## 2015-11-26 HISTORY — PX: CARDIAC CATHETERIZATION: SHX172

## 2015-11-26 LAB — POCT I-STAT 3, VENOUS BLOOD GAS (G3P V)
Acid-base deficit: 4 mmol/L — ABNORMAL HIGH (ref 0.0–2.0)
Acid-base deficit: 3 mmol/L — ABNORMAL HIGH (ref 0.0–2.0)
BICARBONATE: 22 meq/L (ref 20.0–24.0)
Bicarbonate: 23.1 mEq/L (ref 20.0–24.0)
O2 SAT: 74 %
O2 Saturation: 71 %
PCO2 VEN: 46.5 mmHg (ref 45.0–50.0)
PH VEN: 7.312 — AB (ref 7.250–7.300)
PH VEN: 7.305 — AB (ref 7.250–7.300)
PO2 VEN: 44 mmHg (ref 31.0–45.0)
TCO2: 23 mmol/L (ref 0–100)
TCO2: 25 mmol/L (ref 0–100)
pCO2, Ven: 43.5 mmHg — ABNORMAL LOW (ref 45.0–50.0)
pO2, Ven: 40 mmHg (ref 31.0–45.0)

## 2015-11-26 LAB — POCT I-STAT 3, ART BLOOD GAS (G3+)
Acid-base deficit: 3 mmol/L — ABNORMAL HIGH (ref 0.0–2.0)
Bicarbonate: 23.3 mEq/L (ref 20.0–24.0)
O2 SAT: 99 %
PCO2 ART: 43.7 mmHg (ref 35.0–45.0)
TCO2: 25 mmol/L (ref 0–100)
pH, Arterial: 7.335 — ABNORMAL LOW (ref 7.350–7.450)
pO2, Arterial: 129 mmHg — ABNORMAL HIGH (ref 80.0–100.0)

## 2015-11-26 LAB — POCT ACTIVATED CLOTTING TIME: Activated Clotting Time: 183 seconds

## 2015-11-26 LAB — PROTIME-INR
INR: 0.98 (ref 0.00–1.49)
Prothrombin Time: 13.2 seconds (ref 11.6–15.2)

## 2015-11-26 SURGERY — RIGHT/LEFT HEART CATH AND CORONARY ANGIOGRAPHY
Anesthesia: LOCAL

## 2015-11-26 MED ORDER — LIDOCAINE HCL (PF) 1 % IJ SOLN
INTRAMUSCULAR | Status: DC | PRN
  Administered 2015-11-26 (×2): 5 mL

## 2015-11-26 MED ORDER — HEPARIN (PORCINE) IN NACL 2-0.9 UNIT/ML-% IJ SOLN
INTRAMUSCULAR | Status: DC | PRN
  Administered 2015-11-26: 10:00:00

## 2015-11-26 MED ORDER — VERAPAMIL HCL 2.5 MG/ML IV SOLN
INTRAVENOUS | Status: DC | PRN
  Administered 2015-11-26: 8 mL via INTRA_ARTERIAL

## 2015-11-26 MED ORDER — ONDANSETRON HCL 4 MG/2ML IJ SOLN: 4.0000 mg | Freq: Four times a day (QID) | INTRAMUSCULAR | Status: DC | PRN

## 2015-11-26 MED ORDER — MIDAZOLAM HCL 2 MG/2ML IJ SOLN
INTRAMUSCULAR | Status: DC | PRN
  Administered 2015-11-26: 1 mg via INTRAVENOUS

## 2015-11-26 MED ORDER — MIDAZOLAM HCL 2 MG/2ML IJ SOLN
INTRAMUSCULAR | Status: AC
  Filled 2015-11-26: qty 2

## 2015-11-26 MED ORDER — HEPARIN (PORCINE) IN NACL 2-0.9 UNIT/ML-% IJ SOLN
INTRAMUSCULAR | Status: AC
  Filled 2015-11-26: qty 1000

## 2015-11-26 MED ORDER — ACETAMINOPHEN 325 MG PO TABS: 650.0000 mg | ORAL_TABLET | ORAL | Status: DC | PRN

## 2015-11-26 MED ORDER — LIDOCAINE HCL (PF) 1 % IJ SOLN
INTRAMUSCULAR | Status: AC
  Filled 2015-11-26: qty 30

## 2015-11-26 MED ORDER — FENTANYL CITRATE (PF) 100 MCG/2ML IJ SOLN
INTRAMUSCULAR | Status: AC
  Filled 2015-11-26: qty 2

## 2015-11-26 MED ORDER — VERAPAMIL HCL 2.5 MG/ML IV SOLN
INTRAVENOUS | Status: AC
  Filled 2015-11-26: qty 2

## 2015-11-26 MED ORDER — IOPAMIDOL (ISOVUE-370) INJECTION 76%
INTRAVENOUS | Status: AC
  Filled 2015-11-26: qty 100

## 2015-11-26 MED ORDER — SODIUM CHLORIDE 0.9 % IV SOLN: 250.0000 mL | INTRAVENOUS | Status: DC | PRN

## 2015-11-26 MED ORDER — SODIUM CHLORIDE 0.9% FLUSH: 3.0000 mL | INTRAVENOUS | Status: DC | PRN

## 2015-11-26 MED ORDER — SODIUM CHLORIDE 0.9 % IV SOLN: INTRAVENOUS | Status: AC

## 2015-11-26 MED ORDER — SODIUM CHLORIDE 0.9% FLUSH: 3.0000 mL | Freq: Two times a day (BID) | INTRAVENOUS | Status: DC

## 2015-11-26 MED ORDER — FENTANYL CITRATE (PF) 100 MCG/2ML IJ SOLN
INTRAMUSCULAR | Status: DC | PRN
  Administered 2015-11-26: 25 ug via INTRAVENOUS

## 2015-11-26 MED ORDER — SODIUM CHLORIDE 0.9 % IV SOLN
INTRAVENOUS | Status: DC
  Administered 2015-11-26: 07:00:00 via INTRAVENOUS

## 2015-11-26 MED ORDER — IOPAMIDOL (ISOVUE-370) INJECTION 76%
INTRAVENOUS | Status: DC | PRN
  Administered 2015-11-26: 80 mL via INTRA_ARTERIAL

## 2015-11-26 MED ORDER — HEPARIN SODIUM (PORCINE) 1000 UNIT/ML IJ SOLN
INTRAMUSCULAR | Status: AC
  Filled 2015-11-26: qty 1

## 2015-11-26 MED ORDER — HEPARIN SODIUM (PORCINE) 1000 UNIT/ML IJ SOLN
INTRAMUSCULAR | Status: DC | PRN
  Administered 2015-11-26: 3500 [IU] via INTRAVENOUS

## 2015-11-26 SURGICAL SUPPLY — 16 items
BANDAGE COBAN STERILE 2 (GAUZE/BANDAGES/DRESSINGS) ×2 IMPLANT
CATH BALLN WEDGE 5F 110CM (CATHETERS) ×2 IMPLANT
CATH INFINITI 5 FR JL3.5 (CATHETERS) ×2 IMPLANT
CATH INFINITI 5FR ANG PIGTAIL (CATHETERS) ×2 IMPLANT
CATH INFINITI JR4 5F (CATHETERS) ×2 IMPLANT
CATH LAUNCHER 5F JL3 (CATHETERS) ×1 IMPLANT
CATHETER LAUNCHER 5F JL3 (CATHETERS) ×2
GLIDESHEATH SLEND SS 6F .021 (SHEATH) ×2 IMPLANT
KIT HEART LEFT (KITS) ×2 IMPLANT
PACK CARDIAC CATHETERIZATION (CUSTOM PROCEDURE TRAY) ×2 IMPLANT
SHEATH FAST CATH BRACH 5F 5CM (SHEATH) ×2 IMPLANT
SYR MEDRAD MARK V 150ML (SYRINGE) ×2 IMPLANT
TRANSDUCER W/STOPCOCK (MISCELLANEOUS) ×2 IMPLANT
TUBING CIL FLEX 10 FLL-RA (TUBING) ×2 IMPLANT
WIRE HI TORQ VERSACORE-J 145CM (WIRE) ×2 IMPLANT
WIRE SAFE-T 1.5MM-J .035X260CM (WIRE) ×2 IMPLANT

## 2015-11-26 NOTE — Interval H&P Note (Signed)
History and Physical Interval Note:  11/26/2015 9:00 AM  Kristi Greer  has presented today for surgery, with the diagnosis of heart failure  The various methods of treatment have been discussed with the patient and family. After consideration of risks, benefits and other options for treatment, the patient has consented to  Procedure(s): Right/Left Heart Cath and Coronary Angiography (N/A) with possible coronary angioplasty as a surgical intervention .  The patient's history has been reviewed, patient examined, no change in status, stable for surgery.  I have reviewed the patient's chart and labs.  Questions were answered to the patient's satisfaction.     Jermichael Belmares, Quillian Quince

## 2015-11-26 NOTE — Discharge Instructions (Signed)
Radial Site Care °Refer to this sheet in the next few weeks. These instructions provide you with information about caring for yourself after your procedure. Your health care provider may also give you more specific instructions. Your treatment has been planned according to current medical practices, but problems sometimes occur. Call your health care provider if you have any problems or questions after your procedure. °WHAT TO EXPECT AFTER THE PROCEDURE °After your procedure, it is typical to have the following: °· Bruising at the radial site that usually fades within 1-2 weeks. °· Blood collecting in the tissue (hematoma) that may be painful to the touch. It should usually decrease in size and tenderness within 1-2 weeks. °HOME CARE INSTRUCTIONS °· Take medicines only as directed by your health care provider. °· You may shower 24-48 hours after the procedure or as directed by your health care provider. Remove the bandage (dressing) and gently wash the site with plain soap and water. Pat the area dry with a clean towel. Do not rub the site, because this may cause bleeding. °· Do not take baths, swim, or use a hot tub until your health care provider approves. °· Check your insertion site every day for redness, swelling, or drainage. °· Do not apply powder or lotion to the site. °· Do not flex or bend the affected arm for 24 hours or as directed by your health care provider. °· Do not push or pull heavy objects with the affected arm for 24 hours or as directed by your health care provider. °· Do not lift over 10 lb (4.5 kg) for 5 days after your procedure or as directed by your health care provider. °· Ask your health care provider when it is okay to: °¨ Return to work or school. °¨ Resume usual physical activities or sports. °¨ Resume sexual activity. °· Do not drive home if you are discharged the same day as the procedure. Have someone else drive you. °· You may drive 24 hours after the procedure unless otherwise  instructed by your health care provider. °· Do not operate machinery or power tools for 24 hours after the procedure. °· If your procedure was done as an outpatient procedure, which means that you went home the same day as your procedure, a responsible adult should be with you for the first 24 hours after you arrive home. °· Keep all follow-up visits as directed by your health care provider. This is important. °SEEK MEDICAL CARE IF: °· You have a fever. °· You have chills. °· You have increased bleeding from the radial site. Hold pressure on the site and call 911. °SEEK IMMEDIATE MEDICAL CARE IF: °· You have unusual pain at the radial site. °· You have redness, warmth, or swelling at the radial site. °· You have drainage (other than a small amount of blood on the dressing) from the radial site. °· The radial site is bleeding, and the bleeding does not stop after 30 minutes of holding steady pressure on the site. °· Your arm or hand becomes pale, cool, tingly, or numb. °  °This information is not intended to replace advice given to you by your health care provider. Make sure you discuss any questions you have with your health care provider. °  °Document Released: 09/16/2010 Document Revised: 09/04/2014 Document Reviewed: 03/02/2014 °Elsevier Interactive Patient Education ©2016 Elsevier Inc. ° ° °Angiogram, Care After °Refer to this sheet in the next few weeks. These instructions provide you with information about caring for yourself after your   procedure. Your health care provider may also give you more specific instructions. Your treatment has been planned according to current medical practices, but problems sometimes occur. Call your health care provider if you have any problems or questions after your procedure. WHAT TO EXPECT AFTER THE PROCEDURE After your procedure, it is typical to have the following:  Bruising at the catheter insertion site that usually fades within 1-2 weeks.  Blood collecting in the  tissue (hematoma) that may be painful to the touch. It should usually decrease in size and tenderness within 1-2 weeks. HOME CARE INSTRUCTIONS  Take medicines only as directed by your health care provider.  You may shower 24-48 hours after the procedure or as directed by your health care provider. Remove the bandage (dressing) and gently wash the site with plain soap and water. Pat the area dry with a clean towel. Do not rub the site, because this may cause bleeding.  Do not take baths, swim, or use a hot tub until your health care provider approves.  Check your insertion site every day for redness, swelling, or drainage.  Do not apply powder or lotion to the site.  Do not lift over 10 lb (4.5 kg) for 5 days after your procedure or as directed by your health care provider.  Ask your health care provider when it is okay to:  Return to work or school.  Resume usual physical activities or sports.  Resume sexual activity.  Do not drive home if you are discharged the same day as the procedure. Have someone else drive you.  You may drive 24 hours after the procedure unless otherwise instructed by your health care provider.  Do not operate machinery or power tools for 24 hours after the procedure or as directed by your health care provider.  If your procedure was done as an outpatient procedure, which means that you went home the same day as your procedure, a responsible adult should be with you for the first 24 hours after you arrive home.  Keep all follow-up visits as directed by your health care provider. This is important. SEEK MEDICAL CARE IF:  You have a fever.  You have chills.  You have increased bleeding from the catheter insertion site. Hold pressure on the site. SEEK IMMEDIATE MEDICAL CARE IF:  You have unusual pain at the catheter insertion site.  You have redness, warmth, or swelling at the catheter insertion site.  You have drainage (other than a small amount of  blood on the dressing) from the catheter insertion site.  The catheter insertion site is bleeding, and the bleeding does not stop after 30 minutes of holding steady pressure on the site.  The area near or just beyond the catheter insertion site becomes pale, cool, tingly, or numb.   This information is not intended to replace advice given to you by your health care provider. Make sure you discuss any questions you have with your health care provider.   Document Released: 03/02/2005 Document Revised: 09/04/2014 Document Reviewed: 01/15/2013 Elsevier Interactive Patient Education [employer].

## 2015-11-26 NOTE — H&P (View-Only) (Signed)
[name], female   DOB: 1951-04-10, 65 y.o.   MRN: [id number]   ADVANCED HF CLINIC CONSULT NOTE  Referring Physician: Dr.Scott Primary Care: Kristi Greer Primary Cardiologist: Kristi Greer  HPI:  Kristi Greer is a 65 y/o woman with h/o obesity, migraines and HTN referred by Kristi Greer for further evaluation of CHF.  Started feeling short of breath in October 2016. Thought she had bronchitis.   In January 2017 admitted at [facility] with acute CHF and SVT at 150. (No palpitations at the time) Received adenosine but didn't break. (slowed down and ECG looked like possible atrial tach). Eventually converted spontaneously. Echo EF 35-40%. TSH was low at 0.18 but FT4 and FT3 were fine. Felt to possible have hyperthyroidism. Seen by endocrine who started tapazole and a b-blocker in house. However, had f/u with Endo and they stopped the tapazole saying her thyroid wasn't the problem  On 10/11/15 had Myoview EF 30% No ischemia or scar.   Has never smoked. Rare ETOH. No family H/o SCD. Multiple family members with CAD. Says she is feeling better on HF meds. Can go to store and walk around without problem. Does get SOB of with steps. Taking lasix 20 mg daily. No orthopnea or PND. Husband says she snores mildly. Occasional CP at night.    Review of Systems: [y] = yes, [ ]  = no   General: Weight gain [ ] ; Weight loss [ ] ; Anorexia [ ] ; Fatigue [ y]; Fever [ ] ; Chills [ ] ; Weakness [ ]   Cardiac: Chest pain/pressure [ ] ; Resting SOB [ ] ; Exertional SOB Blue.Reese ]; Orthopnea [ ] ; Pedal Edema [ ] ; Palpitations [ ] ; Syncope [ ] ; Presyncope [ ] ; Paroxysmal nocturnal dyspnea[ ]   Pulmonary: Cough [ ] ; Wheezing[ ] ; Hemoptysis[ ] ; Sputum [ ] ; Snoring [ ]   GI: Vomiting[ ] ; Dysphagia[ ] ; Melena[ ] ; Hematochezia [ ] ; Heartburn[ ] ; Abdominal pain [ ] ; Constipation [ ] ; Diarrhea [ ] ; BRBPR [ ]   GU: Hematuria[ ] ; Dysuria [ ] ; Nocturia[ ]   Vascular: Pain in legs with walking [ ] ; Pain in feet with lying  flat [ ] ; Non-healing sores [ ] ; Stroke [ ] ; TIA [ ] ; Slurred speech [ ] ;  Neuro: Headaches[ ] ; Vertigo[ ] ; Seizures[ ] ; Paresthesias[ ] ;Blurred vision [ ] ; Diplopia [ ] ; Vision changes [ ]   Ortho/Skin: Arthritis Blue.Reese ]; Joint pain [ ] ; Muscle pain [ ] ; Joint swelling [ ] ; Back Pain [ ] ; Rash [ ]   Psych: Depression[ ] ; Anxiety[ ]   Heme: Bleeding problems [ ] ; Clotting disorders [ ] ; Anemia [ ]   Endocrine: Diabetes [ ] ; Thyroid dysfunction[y ]   Past Medical History  Diagnosis Date  . Hyperlipidemia   . Migraines   . Hypertension   . Allergy   . Personal history of colonic polyps   . Cancer ([place])     basal cell  . Congestive heart failure [facility])     Current Outpatient Prescriptions  Medication Sig Dispense Refill  . aspirin 81 MG tablet Take 81 mg by mouth daily.    . calcium carbonate (OS-CAL) 600 MG TABS Take 600 mg by mouth at bedtime.     . carvedilol (COREG) 6.25 MG tablet Take 1 tablet (6.25 mg total) by mouth 2 (two) times daily with a meal. 60 tablet 1  . cholecalciferol (VITAMIN D) 1000 UNITS tablet Take 2,000 Units by mouth at bedtime.     . fexofenadine (ALLEGRA) 180 MG tablet Take 180 mg by mouth at bedtime.     Marland Kitchen  furosemide (LASIX) 20 MG tablet Take 1 tablet (20 mg total) by mouth daily. 60 tablet 1  . lisinopril (PRINIVIL,ZESTRIL) 5 MG tablet Take 5 mg by mouth at bedtime. Reported on 11/11/2015    . Olopatadine HCl (PAZEO) 0.7 % SOLN Apply to eye.    Marland Kitchen omeprazole (PRILOSEC) 20 MG capsule Take 1 capsule (20 mg total) by mouth 2 (two) times daily before a meal. 60 capsule 3  . simvastatin (ZOCOR) 10 MG tablet Take 10 mg by mouth at bedtime.    . SUMAtriptan (IMITREX) 100 MG tablet Take 100 mg by mouth every 2 (two) hours as needed for migraine. May repeat in 2 hours if headache persists or recurs.    Marland Kitchen zonisamide (ZONEGRAN) 100 MG capsule Take 1 capsule (100 mg total) by mouth at bedtime. 30 capsule 1   No current facility-administered medications for this encounter.     No Known Allergies    Social History   Social History  . Marital Status: Married    Spouse Name: N/A  . Number of Children: 2  . Years of Education: N/A   Occupational History  . Not on file.   Social History Main Topics  . Smoking status: Never Smoker   . Smokeless tobacco: Never Used  . Alcohol Use: 0.0 oz/week    0 Standard drinks or equivalent per week     Comment: Socially  . Drug Use: No  . Sexual Activity: Not on file   Other Topics Concern  . Not on file   Social History Narrative      Family History  Problem Relation Age of Onset  . Hypertension Mother   . Stroke Mother   . Hypercholesterolemia Father   . Diabetes Mother   . Heart disease Father     MI  . Hypercholesterolemia Father   . Hypertension Father   . Stroke Father   . Breast cancer Neg Hx   . Colon cancer Neg Hx     Filed Vitals:   11/11/15 1513  BP: 120/71  Pulse: 96  Resp: 18  Weight: 163 lb 8 oz (74.163 kg)  SpO2: 96%    PHYSICAL EXAM: General:  Well appearing. No respiratory difficulty HEENT: normal Neck: supple. no JVD. Carotids 2+ bilat; no bruits. No lymphadenopathy or thryomegaly appreciated. Cor: PMI nondisplaced. Regular rate & rhythm. No rubs, gallops or murmurs. Lungs: clear Abdomen: soft, nontender, nondistended. No hepatosplenomegaly. No bruits or masses. Good bowel sounds. Extremities: no cyanosis, clubbing, rash, edema Neuro: alert & oriented x 3, cranial nerves grossly intact. moves all 4 extremities w/o difficulty. Affect pleasant.  ASSESSMENT & PLAN: 1.Acute systolic HF - EF 123456 echo 1/17. myoview 2/17 EF 30% no ischemia/infarct. Possibly tachy-induced --I suspect she has a tachy-induced CM but does have RF for CAD. We discussed role of cath to further evaluate. Will proceed with R/L cath --Place event monitor to look for burden of SVT. May need EP to see for ablation --Add spiro 12.5 --Increase carvedilol to 9.375 bid --Continue lisinopril with goal  to eventually switch to [facility] --Check BMET --May need sleep study at some point though symptoms not overly convincing for severe OSA 2. SVT --as above. Will place event monitor. Increase carvedilol  Total time spent 45 minutes. Over half that time spent discussing above.   Georgio Hattabaugh,MD 10:22 PM

## 2015-11-27 ENCOUNTER — Encounter: Payer: Self-pay | Admitting: Internal Medicine

## 2015-11-30 ENCOUNTER — Ambulatory Visit (INDEPENDENT_AMBULATORY_CARE_PROVIDER_SITE_OTHER): Payer: Managed Care, Other (non HMO) | Admitting: Internal Medicine

## 2015-11-30 ENCOUNTER — Encounter: Payer: Self-pay | Admitting: Internal Medicine

## 2015-11-30 VITALS — BP 112/80 | HR 91 | Temp 97.4°F | Resp 18 | Ht 61.0 in | Wt 157.4 lb

## 2015-11-30 DIAGNOSIS — E7800 Pure hypercholesterolemia, unspecified: Secondary | ICD-10-CM

## 2015-11-30 DIAGNOSIS — R6521 Severe sepsis with septic shock: Secondary | ICD-10-CM

## 2015-11-30 DIAGNOSIS — I471 Supraventricular tachycardia: Secondary | ICD-10-CM | POA: Diagnosis not present

## 2015-11-30 DIAGNOSIS — I5022 Chronic systolic (congestive) heart failure: Secondary | ICD-10-CM

## 2015-11-30 DIAGNOSIS — N179 Acute kidney failure, unspecified: Secondary | ICD-10-CM

## 2015-11-30 DIAGNOSIS — N390 Urinary tract infection, site not specified: Secondary | ICD-10-CM

## 2015-11-30 DIAGNOSIS — R079 Chest pain, unspecified: Secondary | ICD-10-CM

## 2015-11-30 DIAGNOSIS — A419 Sepsis, unspecified organism: Secondary | ICD-10-CM | POA: Diagnosis not present

## 2015-11-30 MED ORDER — NYSTATIN 100000 UNIT/GM EX CREA: 1.0000 "application " | TOPICAL_CREAM | Freq: Two times a day (BID) | CUTANEOUS | Status: DC

## 2015-11-30 NOTE — Progress Notes (Signed)
[name], female   DOB: March 18, 1951, 65 y.o.   MRN: [id number]   Subjective:    [name], female    DOB: 02/28/1951, 65 y.o.   MRN: [id number]  HPI  Patient here for a scheduled follow up.  She was recently admitted and found to be septic.  Had UTI and influenza.  See hospital notes.  Presented with low blood pressure.  Was placed on broad spectrum abx and received IVFs.  Sepsis resolved.  Cr was elevated, but improved after blood pressure improved and treatment with IVFs.  Cardiology evaluated pt and she was placed on amiodarone.  Is followed by cardiology.  Just had heart cath.  No blockages.  She is wearing an event monitor.  Has f/u planned 12/06/15 with cardiology.  She is breathing better.  Denies any chest pain.  She is eating.  Some soft stool noted after eating.  She did trip at work, prior to coming in to the appt.  Injured under her left arm/left breast.  [place] on her left arm/side.  Did not hit her head.  No hip or knee injury.  Some pain with deep breathing.     Past Medical History  Diagnosis Date  . Hyperlipidemia   . Migraines   . Hypertension   . Allergy   . Personal history of colonic polyps   . Cancer ([place])     basal cell  . Congestive heart failure [facility])    Past Surgical History  Procedure Laterality Date  . Tubal ligation    . Finger surgery    . Cholecystectomy  2006  . Vaginal hysterectomy      midurethral sling  . Hysteroscopy  2011, 10/2010  . Cardiac catheterization N/A 11/26/2015    Procedure: Right/Left Heart Cath and Coronary Angiography;  Surgeon: Jolaine Artist, MD;  Location: [place] [facility];  Service: Cardiovascular;  Laterality: N/A;   Family History  Problem Relation Age of Onset  . Hypertension Mother   . Stroke Mother   . Hypercholesterolemia Father   . Diabetes Mother   . Heart disease Father     MI  . Hypercholesterolemia Father   . Hypertension Father   . Stroke Father   . Breast  cancer Neg Hx   . Colon cancer Neg Hx    Social History   Social History  . Marital Status: Married    Spouse Name: N/A  . Number of Children: 2  . Years of Education: N/A   Social History Main Topics  . Smoking status: Never Smoker   . Smokeless tobacco: Never Used  . Alcohol Use: 0.0 oz/week    0 Standard drinks or equivalent per week     Comment: Socially  . Drug Use: No  . Sexual Activity: Not Asked   Other Topics Concern  . None   Social History Narrative    Outpatient Encounter Prescriptions as of 11/30/2015  Medication Sig  . aspirin 81 MG tablet Take 81 mg by mouth daily.  . benzonatate (TESSALON) 100 MG capsule Take 1 capsule (100 mg total) by mouth 3 (three) times daily as needed for cough.  . calcium carbonate (OS-CAL) 600 MG TABS Take 600 mg by mouth at bedtime.   . carvedilol (COREG) 6.25 MG tablet Take 1.5 tablets (9.375 mg total) by mouth 2 (two) times daily with a meal.  . chlorpheniramine-HYDROcodone (TUSSIONEX) 10-8 MG/5ML SUER Take 5 mLs by mouth every 12 (twelve) hours as needed  for cough.  . cholecalciferol (VITAMIN D) 1000 UNITS tablet Take 2,000 Units by mouth at bedtime.   . fexofenadine (ALLEGRA) 180 MG tablet Take 180 mg by mouth at bedtime.   . furosemide (LASIX) 20 MG tablet Take 1 tablet (20 mg total) by mouth daily.  . hydroxypropyl methylcellulose / hypromellose (ISOPTO TEARS / GONIOVISC) 2.5 % ophthalmic solution Place 1 drop into both eyes 2 (two) times daily.  Marland Kitchen lisinopril (PRINIVIL,ZESTRIL) 5 MG tablet Take 5 mg by mouth at bedtime. Reported on 11/11/2015  . Olopatadine HCl (PAZEO) 0.7 % SOLN Place 1 drop into both eyes every morning.   Marland Kitchen omeprazole (PRILOSEC) 20 MG capsule Take 1 capsule (20 mg total) by mouth 2 (two) times daily before a meal.  . simvastatin (ZOCOR) 10 MG tablet Take 10 mg by mouth at bedtime.  Marland Kitchen spironolactone (ALDACTONE) 25 MG tablet Take 0.5 tablets (12.5 mg total) by mouth daily.  . SUMAtriptan (IMITREX) 100 MG tablet  Take 100 mg by mouth every 2 (two) hours as needed for migraine. May repeat in 2 hours if headache persists or recurs.  Marland Kitchen zonisamide (ZONEGRAN) 100 MG capsule Take 1 capsule (100 mg total) by mouth at bedtime.  Marland Kitchen nystatin cream (MYCOSTATIN) Apply 1 application topically 2 (two) times daily.  . [DISCONTINUED] cephALEXin (KEFLEX) 500 MG capsule Take 1 capsule (500 mg total) by mouth every 12 (twelve) hours. (Patient not taking: Reported on 11/30/2015)  . [DISCONTINUED] oseltamivir (TAMIFLU) 30 MG capsule Take 1 capsule (30 mg total) by mouth 2 (two) times daily. (Patient not taking: Reported on 11/30/2015)   No facility-administered encounter medications on file as of 11/30/2015.    Review of Systems  Constitutional: Negative for unexpected weight change.       She is eating.   HENT: Negative for congestion and sinus pressure.   Respiratory: Negative for cough and chest tightness.        Breathing better.   Cardiovascular: Negative for chest pain, palpitations and leg swelling.  Gastrointestinal: Negative for nausea, vomiting and abdominal pain.       Some soft stool.   Genitourinary: Negative for dysuria and difficulty urinating.  Musculoskeletal: Negative for back pain and joint swelling.       Pain under left arm.  Started after fall today.    Skin: Negative for color change and rash.  Neurological: Negative for dizziness, light-headedness and headaches.  Psychiatric/Behavioral: Negative for dysphoric mood and agitation.       Objective:    Physical Exam  Constitutional: She appears well-developed and well-nourished. No distress.  HENT:  Nose: Nose normal.  Mouth/Throat: Oropharynx is clear and moist.  Neck: Neck supple. No thyromegaly present.  Cardiovascular: Normal rate and regular rhythm.   Pulmonary/Chest: Breath sounds normal. No respiratory distress. She has no wheezes.  Good breath sounds bilaterally.  Some pain with deep breathing.    Abdominal: Soft. Bowel sounds are  normal. There is no tenderness.  Musculoskeletal: She exhibits no edema or tenderness.  Increased tenderness to palpation left lateral side - beside left breast.    Lymphadenopathy:    She has no cervical adenopathy.  Skin: No rash noted. No erythema.  Psychiatric: She has a normal mood and affect. Her behavior is normal.    BP 112/80 mmHg  Pulse 91  Temp(Src) 97.4 F (36.3 C) (Oral)  Resp 18  Ht 5\' 1"  (1.549 m)  Wt 157 lb 6 oz (71.385 kg)  BMI 29.75 kg/m2  SpO2 98%  LMP  10/18/2012 Wt Readings from Last 3 Encounters:  11/30/15 157 lb 6 oz (71.385 kg)  11/26/15 153 lb (69.4 kg)  11/14/15 156 lb (70.761 kg)     Lab Results  Component Value Date   WBC 12.6* 11/25/2015   HGB 13.5 11/25/2015   HCT 41.2 11/25/2015   PLT 317.0 11/25/2015   GLUCOSE 99 11/25/2015   CHOL 161 11/25/2015   TRIG 161.0* 11/25/2015   HDL 38.50* 11/25/2015   LDLCALC 90 11/25/2015   ALT 18 11/25/2015   AST 19 11/25/2015   NA 140 11/25/2015   K 4.7 11/25/2015   CL 104 11/25/2015   CREATININE 1.01 11/25/2015   BUN 20 11/25/2015   CO2 28 11/25/2015   TSH 0.46 11/25/2015   INR 0.98 11/26/2015    Ct Abdomen Pelvis Wo Contrast  11/14/2015  CLINICAL DATA:  Fever, hypotension, vomiting and diarrhea. Onset yesterday. EXAM: CT ABDOMEN AND PELVIS WITHOUT CONTRAST TECHNIQUE: Multidetector CT imaging of the abdomen and pelvis was performed following the standard protocol without IV contrast. COMPARISON:  10/27/2013 FINDINGS: There is cholecystectomy. There are unremarkable unenhanced appearances of the liver and bile ducts. There are unremarkable unenhanced appearances of the spleen, pancreas, adrenals and kidneys. Parapelvic cysts are incidentally noted about both kidneys, unchanged from 10/27/2013. Ureters and urinary bladder are unremarkable. There is a small hiatal hernia. Stomach is otherwise unremarkable. Small bowel is unremarkable. Appendix is normal. Colon is remarkable only for minimal uncomplicated  diverticulosis. The abdominal aorta is normal in caliber. There is no atherosclerotic calcification. There is no adenopathy in the abdomen or pelvis. No acute inflammatory changes are evident in the abdomen or pelvis. There is no adenopathy. There is no ascites. There is prior hysterectomy. No adnexal abnormalities are evident. No significant abnormalities evident in the lower chest. There is mild unchanged linear basilar scarring. No significant skeletal lesion is evident. Moderate degenerative lumbar disc disease is present, greatest at L2-3. IMPRESSION: 1. No acute findings are evident in the abdomen or pelvis. 2. Small hiatal hernia. 3. Diverticulosis. Electronically Signed   By: Andreas Newport M.D.   On: 11/14/2015 23:30   Dg Chest Portable 1 View  11/14/2015  CLINICAL DATA:  Fever and hypotension, onset last night. EXAM: PORTABLE CHEST 1 VIEW COMPARISON:  08/31/2015 FINDINGS: There is mild unchanged cardiomegaly. The lungs are clear. There is no pleural effusion. The pulmonary vasculature is normal. IMPRESSION: Cardiomegaly.  No acute findings. Electronically Signed   By: Andreas Newport M.D.   On: 11/14/2015 23:14       Assessment & Greer:   Problem List Items Addressed This Visit    AKI (acute kidney injury) ([place])    Improved after hydration and treatment of infection.        Chronic systolic heart failure ([place])    Followed by cardiology.  Has f/u planned 12/06/15.        Hypercholesterolemia    Low cholesterol diet and exercise.  On simvastatin.  Follow lipid panel and liver function tests.        Left sided chest pain    Noticed after a fall today.  Good breath sounds bilaterally.  Tylenol.  Discussed xray.  She wants to hold.  Follow.        Septic shock ([place])    Infection treated.  Given IVFs.  Resolved.        Relevant Medications   nystatin cream (MYCOSTATIN)   SVT (supraventricular tachycardia) (H[facility]) - Primary    On carvedilol.  Stable.  Followed by cardiology.          UTI (lower urinary tract infection)    Resolved.        Relevant Medications   nystatin cream (MYCOSTATIN)       Einar Pheasant, MD

## 2015-11-30 NOTE — Progress Notes (Signed)
Pre-visit discussion using our clinic review tool. No additional management support is needed unless otherwise documented below in the visit note.  

## 2015-12-05 ENCOUNTER — Encounter: Payer: Self-pay | Admitting: Internal Medicine

## 2015-12-05 DIAGNOSIS — R079 Chest pain, unspecified: Secondary | ICD-10-CM | POA: Insufficient documentation

## 2015-12-05 NOTE — Assessment & Plan Note (Signed)
Improved after hydration and treatment of infection.

## 2015-12-05 NOTE — Assessment & Plan Note (Signed)
Followed by cardiology.  Has f/u planned 12/06/15.

## 2015-12-05 NOTE — Assessment & Plan Note (Signed)
Low cholesterol diet and exercise.  On simvastatin.  Follow lipid panel and liver function tests.   

## 2015-12-05 NOTE — Assessment & Plan Note (Signed)
On carvedilol.  Stable.  Followed by cardiology.  

## 2015-12-05 NOTE — Assessment & Plan Note (Signed)
Resolved

## 2015-12-05 NOTE — Assessment & Plan Note (Signed)
Noticed after a fall today.  Good breath sounds bilaterally.  Tylenol.  Discussed xray.  She wants to hold.  Follow.

## 2015-12-05 NOTE — Assessment & Plan Note (Signed)
Infection treated.  Given IVFs.  Resolved.

## 2015-12-06 ENCOUNTER — Encounter (HOSPITAL_COMMUNITY): Payer: Self-pay | Admitting: Internal Medicine

## 2015-12-06 ENCOUNTER — Ambulatory Visit (HOSPITAL_COMMUNITY)
Admission: RE | Admit: 2015-12-06 | Discharge: 2015-12-06 | Disposition: A | Discharge status: Home / Self Care | Payer: Managed Care, Other (non HMO) | Source: Ambulatory Visit | Attending: Internal Medicine | Admitting: Internal Medicine

## 2015-12-06 ENCOUNTER — Encounter: Payer: Managed Care, Other (non HMO) | Admitting: Cardiology

## 2015-12-06 VITALS — BP 104/66 | HR 76 | Wt 157.5 lb

## 2015-12-06 DIAGNOSIS — Z833 Family history of diabetes mellitus: Secondary | ICD-10-CM | POA: Insufficient documentation

## 2015-12-06 DIAGNOSIS — I5041 Acute combined systolic (congestive) and diastolic (congestive) heart failure: Secondary | ICD-10-CM | POA: Diagnosis not present

## 2015-12-06 DIAGNOSIS — Z79899 Other long term (current) drug therapy: Secondary | ICD-10-CM | POA: Diagnosis not present

## 2015-12-06 DIAGNOSIS — Z8249 Family history of ischemic heart disease and other diseases of the circulatory system: Secondary | ICD-10-CM | POA: Diagnosis not present

## 2015-12-06 DIAGNOSIS — I5021 Acute systolic (congestive) heart failure: Secondary | ICD-10-CM | POA: Insufficient documentation

## 2015-12-06 DIAGNOSIS — E785 Hyperlipidemia, unspecified: Secondary | ICD-10-CM | POA: Insufficient documentation

## 2015-12-06 DIAGNOSIS — I5022 Chronic systolic (congestive) heart failure: Secondary | ICD-10-CM | POA: Diagnosis not present

## 2015-12-06 DIAGNOSIS — Z7982 Long term (current) use of aspirin: Secondary | ICD-10-CM | POA: Diagnosis not present

## 2015-12-06 DIAGNOSIS — Z823 Family history of stroke: Secondary | ICD-10-CM | POA: Insufficient documentation

## 2015-12-06 DIAGNOSIS — I110 Hypertensive heart disease with heart failure: Secondary | ICD-10-CM | POA: Diagnosis not present

## 2015-12-06 DIAGNOSIS — I471 Supraventricular tachycardia, unspecified: Secondary | ICD-10-CM

## 2015-12-06 LAB — BASIC METABOLIC PANEL
Anion gap: 10 (ref 5–15)
BUN: 19 mg/dL (ref 6–20)
CALCIUM: 9.3 mg/dL (ref 8.9–10.3)
CO2: 24 mmol/L (ref 22–32)
CREATININE: 0.95 mg/dL (ref 0.44–1.00)
Chloride: 108 mmol/L (ref 101–111)
GFR calc Af Amer: >60 mL/min (ref >60)
GLUCOSE: 87 mg/dL (ref 65–99)
POTASSIUM: 4.4 mmol/L (ref 3.5–5.1)
SODIUM: 142 mmol/L (ref 135–145)

## 2015-12-06 MED ORDER — CARVEDILOL 12.5 MG PO TABS: 12.5000 mg | ORAL_TABLET | Freq: Two times a day (BID) | ORAL | Status: DC

## 2015-12-06 NOTE — Progress Notes (Signed)
Advanced Heart Failure Medication Review by a Pharmacist  Does the patient  feel that his/her medications are working for him/her?  yes  Has the patient been experiencing any side effects to the medications prescribed?  no  Does the patient measure his/her own blood pressure or blood glucose at home?  yes   Does the patient have any problems obtaining medications due to transportation or finances?   no  Understanding of regimen: excellent Understanding of indications: excellent Potential of compliance: excellent Patient understands to avoid NSAIDs. Patient understands to avoid decongestants.  Issues to address at subsequent visits: none   Pharmacist comments: Kristi Greer is a pleasant 65 yo woman here for HF clinic f/u. She is adherent to her medications and has not recently had side effects from medications. She mentioned being admitted to hospital last month for flu and was unable to tolerate her carvedilol dose at that time d/t drop in BP. Has since recovered and tolerating carvedilol 9.375 mg BID fine. Also on lisinopril 5 mg and spironolactone 12.5 mg, denies having any symptoms of dizziness, lightheadedness, or intolerance. Does have occasional cough which goes away with Tussionex.   Recently fell and thinks she fractured ribs, otherwise pt is doing well and has no issues.   [name], [place].D., BCPS PGY2 Cardiology Pharmacy Resident Pager: [phone]    Time with patient: 5 min Preparation and documentation time: 5 min Total time: 10 min

## 2015-12-06 NOTE — Patient Instructions (Signed)
Increase Carvedilol to 12.5mg  twice daily.  Routine lab work today. Will notify you of abnormal results  Follow up with Dr.Bensimhon in 6 weeks.

## 2015-12-06 NOTE — Progress Notes (Signed)
[name], female   DOB: [birth year], 65 y.o.   MRN: [id number]   ADVANCED HF CLINIC CONSULT NOTE  Referring Physician: Dr.Scott Primary Care: Kristi Greer Primary Cardiologist: Kristi Greer  HPI:  Kristi Greer is a 65 y/o woman with h/o obesity, migraines and HTN referred by Kristi Greer for further evaluation of CHF.  Started feeling short of breath in October 2016. Thought she had bronchitis.   In January 2017 admitted at [facility] with acute CHF and SVT at 150. (No palpitations at the time) Received adenosine but didn't break. (slowed down and ECG looked like possible atrial tach). Eventually converted spontaneously. Echo EF 35-40%. TSH was low at 0.18 but FT4 and FT3 were fine. Felt to possible have hyperthyroidism. Seen by endocrine who started tapazole and a b-blocker in house. However, had f/u with Endo and they stopped the tapazole saying her thyroid wasn't the problem  Underwent cath 11/26/15 with NICM and well compensated hemodynamics. Monitor placed to look for recurrent SVT.   Here for HF follow-up: Doing well. Tolerating meds well. Had a fall at work (tripped) and feels she bruised some ribs. Back to work. No palpitations or SOB. No syncope.   On 10/11/15 had Myoview EF 30% No ischemia or scar.   R/L cath 11/26/15:  Ao = 104/61 (78) LV = 107/6/14 RA = 4 RV = 28/4/8 PA = 25/8 (16) PCW = 7 Fick cardiac output/index = 4.7/2.8 PVR = 2.0 WU SVR = 1265 FA sat = 99% PA sat = 71%, 74%  Assessment: 1. Normal coronary arteries 2. 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 Well compensated hemodynamics  Has never smoked. Rare ETOH. No family H/o SCD. Multiple family members with CAD. Says she is feeling better on HF meds. Can go to store and walk around without problem. Does get SOB of with steps. Taking lasix 20 mg daily. No orthopnea or PND. Husband says she snores mildly. Occasional CP at night.    Review of Systems: [y] = yes, [ ]  = no   General:  Weight gain [ ] ; Weight loss [ ] ; Anorexia [ ] ; Fatigue [ y]; Fever [ ] ; Chills [ ] ; Weakness [ ]   Cardiac: Chest pain/pressure [ ] ; Resting SOB [ ] ; Exertional SOB Blue.Reese ]; Orthopnea [ ] ; Pedal Edema [ ] ; Palpitations [ ] ; Syncope [ ] ; Presyncope [ ] ; Paroxysmal nocturnal dyspnea[ ]   Pulmonary: Cough [ ] ; Wheezing[ ] ; Hemoptysis[ ] ; Sputum [ ] ; Snoring [ ]   GI: Vomiting[ ] ; Dysphagia[ ] ; Melena[ ] ; Hematochezia [ ] ; Heartburn[ ] ; Abdominal pain [ ] ; Constipation [ ] ; Diarrhea [ ] ; BRBPR [ ]   GU: Hematuria[ ] ; Dysuria [ ] ; Nocturia[ ]   Vascular: Pain in legs with walking [ ] ; Pain in feet with lying flat [ ] ; Non-healing sores [ ] ; Stroke [ ] ; TIA [ ] ; Slurred speech [ ] ;  Neuro: Headaches[ ] ; Vertigo[ ] ; Seizures[ ] ; Paresthesias[ ] ;Blurred vision [ ] ; Diplopia [ ] ; Vision changes [ ]   Ortho/Skin: Arthritis Blue.Reese ]; Joint pain [ ] ; Muscle pain [ ] ; Joint swelling [ ] ; Back Pain [ ] ; Rash [ ]   Psych: Depression[ ] ; Anxiety[ ]   Heme: Bleeding problems [ ] ; Clotting disorders [ ] ; Anemia [ ]   Endocrine: Diabetes [ ] ; Thyroid dysfunction[y ]   Past Medical History  Diagnosis Date  . Hyperlipidemia   . Migraines   . Hypertension   . Allergy   . Personal history of colonic polyps   . Cancer ([place])  basal cell  . Congestive heart failure [facility])     Current Outpatient Prescriptions  Medication Sig Dispense Refill  . aspirin 81 MG tablet Take 81 mg by mouth daily.    . calcium carbonate (OS-CAL) 600 MG TABS Take 600 mg by mouth at bedtime.     . carvedilol (COREG) 6.25 MG tablet Take 1.5 tablets (9.375 mg total) by mouth 2 (two) times daily with a meal. 90 tablet 6  . Cholecalciferol 1000 units capsule Take 1,000 Units by mouth daily.    . fexofenadine (ALLEGRA) 180 MG tablet Take 180 mg by mouth at bedtime.     . furosemide (LASIX) 20 MG tablet Take 1 tablet (20 mg total) by mouth daily. 60 tablet 1  . hydroxypropyl methylcellulose / hypromellose (ISOPTO TEARS / GONIOVISC) 2.5 % ophthalmic  solution Place 1 drop into both eyes 2 (two) times daily.    Marland Kitchen lisinopril (PRINIVIL,ZESTRIL) 5 MG tablet Take 5 mg by mouth at bedtime. Reported on 11/11/2015    . nystatin cream (MYCOSTATIN) Apply 1 application topically 2 (two) times daily. 30 g 0  . Olopatadine HCl (PAZEO) 0.7 % SOLN Place 1 drop into both eyes every morning.     Marland Kitchen omeprazole (PRILOSEC) 20 MG capsule Take 1 capsule (20 mg total) by mouth 2 (two) times daily before a meal. 60 capsule 3  . simvastatin (ZOCOR) 10 MG tablet Take 10 mg by mouth at bedtime.    Marland Kitchen spironolactone (ALDACTONE) 25 MG tablet Take 0.5 tablets (12.5 mg total) by mouth daily. 45 tablet 3  . zonisamide (ZONEGRAN) 100 MG capsule Take 1 capsule (100 mg total) by mouth at bedtime. 30 capsule 1  . chlorpheniramine-HYDROcodone (TUSSIONEX) 10-8 MG/5ML SUER Take 5 mLs by mouth every 12 (twelve) hours as needed for cough. 140 mL 0  . SUMAtriptan (IMITREX) 100 MG tablet Take 100 mg by mouth every 2 (two) hours as needed for migraine. May repeat in 2 hours if headache persists or recurs.     No current facility-administered medications for this encounter.    No Known Allergies    Social History   Social History  . Marital Status: Married    Spouse Name: N/A  . Number of Children: 2  . Years of Education: N/A   Occupational History  . Not on file.   Social History Main Topics  . Smoking status: Never Smoker   . Smokeless tobacco: Never Used  . Alcohol Use: 0.0 oz/week    0 Standard drinks or equivalent per week     Comment: Socially  . Drug Use: No  . Sexual Activity: Not on file   Other Topics Concern  . Not on file   Social History Narrative      Family History  Problem Relation Age of Onset  . Hypertension Mother   . Stroke Mother   . Hypercholesterolemia Father   . Diabetes Mother   . Heart disease Father     MI  . Hypercholesterolemia Father   . Hypertension Father   . Stroke Father   . Breast cancer Neg Hx   . Colon cancer Neg Hx      Filed Vitals:   12/06/15 1508  BP: 104/66  Pulse: 76  Weight: 157 lb 8 oz (71.442 kg)  SpO2: 98%    PHYSICAL EXAM: General:  Well appearing. No respiratory difficulty HEENT: normal Neck: supple. no JVD. Carotids 2+ bilat; no bruits. No lymphadenopathy or thryomegaly appreciated. Cor: PMI nondisplaced. Regular rate &  rhythm. No rubs, gallops or murmurs. Lungs: clear Abdomen: soft, nontender, nondistended. No hepatosplenomegaly. No bruits or masses. Good bowel sounds. Extremities: no cyanosis, clubbing, rash, edema Neuro: alert & oriented x 3, cranial nerves grossly intact. moves all 4 extremities w/o difficulty. Affect pleasant.  ASSESSMENT & PLAN: 1.Acute systolic HF - EF 123456 echo 1/17. Cath 3/17 EF 30-35% normal cors --NYHA II. Volume status looks good.  --I suspect she has a tachy-induced CM --Continue event monitor to look for burden of SVT. May need EP to see for ablation --Add spiro 12.5 --Increase carvedilol to 12.5 bid --Continue lisinopril with goal to eventually switch to [facility] --May need sleep study at some point though symptoms not overly convincing for severe OSA 2. SVT --as above. Will place event monitor. Increase carvedilol   Juwon Scripter,MD 3:19 PM

## 2015-12-26 ENCOUNTER — Other Ambulatory Visit: Payer: Self-pay | Admitting: Internal Medicine

## 2016-01-26 ENCOUNTER — Ambulatory Visit (HOSPITAL_COMMUNITY)
Admission: RE | Admit: 2016-01-26 | Discharge: 2016-01-26 | Disposition: A | Discharge status: Home / Self Care | Payer: Managed Care, Other (non HMO) | Source: Ambulatory Visit | Attending: Internal Medicine | Admitting: Internal Medicine

## 2016-01-26 VITALS — BP 102/58 | HR 81 | Wt 161.2 lb

## 2016-01-26 DIAGNOSIS — Z79899 Other long term (current) drug therapy: Secondary | ICD-10-CM | POA: Diagnosis not present

## 2016-01-26 DIAGNOSIS — Z8249 Family history of ischemic heart disease and other diseases of the circulatory system: Secondary | ICD-10-CM | POA: Diagnosis not present

## 2016-01-26 DIAGNOSIS — I5022 Chronic systolic (congestive) heart failure: Secondary | ICD-10-CM | POA: Diagnosis present

## 2016-01-26 DIAGNOSIS — E785 Hyperlipidemia, unspecified: Secondary | ICD-10-CM | POA: Insufficient documentation

## 2016-01-26 DIAGNOSIS — Z833 Family history of diabetes mellitus: Secondary | ICD-10-CM | POA: Insufficient documentation

## 2016-01-26 DIAGNOSIS — I110 Hypertensive heart disease with heart failure: Secondary | ICD-10-CM | POA: Insufficient documentation

## 2016-01-26 DIAGNOSIS — I471 Supraventricular tachycardia: Secondary | ICD-10-CM | POA: Insufficient documentation

## 2016-01-26 DIAGNOSIS — Z823 Family history of stroke: Secondary | ICD-10-CM | POA: Diagnosis not present

## 2016-01-26 DIAGNOSIS — Z7982 Long term (current) use of aspirin: Secondary | ICD-10-CM | POA: Diagnosis not present

## 2016-01-26 DIAGNOSIS — I4729 Other ventricular tachycardia: Secondary | ICD-10-CM

## 2016-01-26 DIAGNOSIS — I472 Ventricular tachycardia: Secondary | ICD-10-CM | POA: Diagnosis not present

## 2016-01-26 LAB — BASIC METABOLIC PANEL
ANION GAP: 7 (ref 5–15)
BUN: 18 mg/dL (ref 6–20)
CALCIUM: 9.4 mg/dL (ref 8.9–10.3)
CHLORIDE: 107 mmol/L (ref 101–111)
CO2: 26 mmol/L (ref 22–32)
Creatinine, Ser: 0.96 mg/dL (ref 0.44–1.00)
GFR calc non Af Amer: >60 mL/min (ref >60)
GLUCOSE: 99 mg/dL (ref 65–99)
Potassium: 4.7 mmol/L (ref 3.5–5.1)
Sodium: 140 mmol/L (ref 135–145)

## 2016-01-26 LAB — MAGNESIUM: Magnesium: 2.1 mg/dL (ref 1.7–2.4)

## 2016-01-26 MED ORDER — FUROSEMIDE 20 MG PO TABS: 20.0000 mg | ORAL_TABLET | ORAL | Status: DC

## 2016-01-26 NOTE — Patient Instructions (Signed)
Change Furosemide (Lasix) to every other day   Labs today  Your physician has requested that you have an echocardiogram. Echocardiography is a painless test that uses sound waves to create images of your heart. It provides your doctor with information about the size and shape of your heart and how well your heart's chambers and valves are working. This procedure takes approximately one hour. There are no restrictions for this procedure.  Your physician recommends that you schedule a follow-up appointment in: 2 months

## 2016-01-26 NOTE — Progress Notes (Signed)
[name], female   DOB: 1951/03/11, 65 y.o.   MRN: [id number]   ADVANCED HF CLINIC CONSULT NOTE  Referring Physician: Dr.Scott Primary Care: Einar Pheasant Primary Cardiologist: Paraschos  HPI:  Kristi Greer is a 65 y/o woman with h/o obesity, migraines and HTN referred by Dr. Nicki Reaper for further evaluation of CHF.  Started feeling short of breath in October 2016. Thought she had bronchitis.   In January 2017 admitted at [facility] with acute CHF and SVT at 150. (No palpitations at the time) Received adenosine but didn't break. (slowed down and ECG looked like possible atrial tach). Eventually converted spontaneously. Echo EF 35-40%. TSH was low at 0.18 but FT4 and FT3 were fine. Felt to possible have hyperthyroidism. Seen by endocrine who started tapazole and a b-blocker in house. However, had f/u with Endo and they stopped the tapazole saying her thyroid wasn't the problem  Underwent cath 11/26/15 with NICM and well compensated hemodynamics. Monitor placed to look for recurrent SVT.   Here for HF follow-up: Says she is feeling great. No CP. Very mild DOE. No palpitations. Feels like she is back to baseline.Weight stable Event monitor reviewed. No SVT. 7 beat NSVT.   On 10/11/15 had Myoview EF 30% No ischemia or scar.  Event monitor 4/17: NSR 1 7 beat NSVT. No SVT  R/L cath 11/26/15:  Ao = 104/61 (78) LV = 107/6/14 RA = 4 RV = 28/4/8 PA = 25/8 (16) PCW = 7 Fick cardiac output/index = 4.7/2.8 PVR = 2.0 WU SVR = 1265 FA sat = 99% PA sat = 71%, 74%  Assessment: 1. Normal coronary arteries 2. 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 Well compensated hemodynamics  Has never smoked. Rare ETOH. No family H/o SCD. Multiple family members with CAD.    Review of Systems: [y] = yes, [ ]  = no   General: Weight gain [ ] ; Weight loss [ ] ; Anorexia [ ] ; Fatigue [ y]; Fever [ ] ; Chills [ ] ; Weakness [ ]   Cardiac: Chest pain/pressure [ ] ; Resting SOB [ ] ;  Exertional SOB Blue.Reese ]; Orthopnea [ ] ; Pedal Edema [ ] ; Palpitations [ ] ; Syncope [ ] ; Presyncope [ ] ; Paroxysmal nocturnal dyspnea[ ]   Pulmonary: Cough [ ] ; Wheezing[ ] ; Hemoptysis[ ] ; Sputum [ ] ; Snoring [ ]   GI: Vomiting[ ] ; Dysphagia[ ] ; Melena[ ] ; Hematochezia [ ] ; Heartburn[ ] ; Abdominal pain [ ] ; Constipation [ ] ; Diarrhea [ ] ; BRBPR [ ]   GU: Hematuria[ ] ; Dysuria [ ] ; Nocturia[ ]   Vascular: Pain in legs with walking [ ] ; Pain in feet with lying flat [ ] ; Non-healing sores [ ] ; Stroke [ ] ; TIA [ ] ; Slurred speech [ ] ;  Neuro: Headaches[ ] ; Vertigo[ ] ; Seizures[ ] ; Paresthesias[ ] ;Blurred vision [ ] ; Diplopia [ ] ; Vision changes [ ]   Ortho/Skin: Arthritis Blue.Reese ]; Joint pain [ ] ; Muscle pain [ ] ; Joint swelling [ ] ; Back Pain [ ] ; Rash [ ]   Psych: Depression[ ] ; Anxiety[ ]   Heme: Bleeding problems [ ] ; Clotting disorders [ ] ; Anemia [ ]   Endocrine: Diabetes [ ] ; Thyroid dysfunction[y ]   Past Medical History  Diagnosis Date  . Hyperlipidemia   . Migraines   . Hypertension   . Allergy   . Personal history of colonic polyps   . Cancer ([place])     basal cell  . Congestive heart failure [facility])     Current Outpatient Prescriptions  Medication Sig Dispense Refill  . aspirin 81 MG tablet Take 81  mg by mouth daily.    . calcium carbonate (OS-CAL) 600 MG TABS Take 600 mg by mouth at bedtime.     . carvedilol (COREG) 12.5 MG tablet Take 1 tablet (12.5 mg total) by mouth 2 (two) times daily with a meal. 60 tablet 3  . chlorpheniramine-HYDROcodone (TUSSIONEX) 10-8 MG/5ML SUER Take 5 mLs by mouth every 12 (twelve) hours as needed for cough. 140 mL 0  . Cholecalciferol 1000 units capsule Take 1,000 Units by mouth daily.    . fexofenadine (ALLEGRA) 180 MG tablet Take 180 mg by mouth at bedtime.     . furosemide (LASIX) 20 MG tablet Take 1 tablet (20 mg total) by mouth daily. 60 tablet 1  . hydroxypropyl methylcellulose / hypromellose (ISOPTO TEARS / GONIOVISC) 2.5 % ophthalmic solution Place 1 drop  into both eyes 2 (two) times daily.    Marland Kitchen ketorolac (ACULAR) 0.5 % ophthalmic solution Place 1 drop into the left eye 4 (four) times daily.    Marland Kitchen lisinopril (PRINIVIL,ZESTRIL) 5 MG tablet Take 5 mg by mouth at bedtime. Reported on 11/11/2015    . nystatin cream (MYCOSTATIN) Apply 1 application topically 2 (two) times daily. 30 g 0  . ofloxacin (OCUFLOX) 0.3 % ophthalmic solution Place 1 drop into the left eye 4 (four) times daily.    . Olopatadine HCl (PAZEO) 0.7 % SOLN Place 1 drop into both eyes every morning.     Marland Kitchen omeprazole (PRILOSEC) 20 MG capsule Take 1 capsule (20 mg total) by mouth 2 (two) times daily before a meal. 60 capsule 3  . prednisoLONE acetate (PRED FORTE) 1 % ophthalmic suspension Place 1 drop into the left eye 4 (four) times daily.    . simvastatin (ZOCOR) 10 MG tablet TAKE 1 TABLET (10 MG TOTAL) BY MOUTH DAILY AT 6 PM. 30 tablet 11  . spironolactone (ALDACTONE) 25 MG tablet Take 0.5 tablets (12.5 mg total) by mouth daily. 45 tablet 3  . SUMAtriptan (IMITREX) 100 MG tablet Take 100 mg by mouth every 2 (two) hours as needed for migraine. May repeat in 2 hours if headache persists or recurs.    Marland Kitchen zonisamide (ZONEGRAN) 100 MG capsule Take 1 capsule (100 mg total) by mouth at bedtime. 30 capsule 1   No current facility-administered medications for this encounter.    No Known Allergies    Social History   Social History  . Marital Status: Married    Spouse Name: N/A  . Number of Children: 2  . Years of Education: N/A   Occupational History  . Not on file.   Social History Main Topics  . Smoking status: Never Smoker   . Smokeless tobacco: Never Used  . Alcohol Use: 0.0 oz/week    0 Standard drinks or equivalent per week     Comment: Socially  . Drug Use: No  . Sexual Activity: Not on file   Other Topics Concern  . Not on file   Social History Narrative      Family History  Problem Relation Age of Onset  . Hypertension Mother   . Stroke Mother   .  Hypercholesterolemia Father   . Diabetes Mother   . Heart disease Father     MI  . Hypercholesterolemia Father   . Hypertension Father   . Stroke Father   . Breast cancer Neg Hx   . Colon cancer Neg Hx     Filed Vitals:   01/26/16 0944  BP: 102/58  Pulse: 81  Weight:  161 lb 4 oz (73.143 kg)  SpO2: 97%    PHYSICAL EXAM: General:  Well appearing. No respiratory difficulty HEENT: normal Neck: supple. no JVD. Carotids 2+ bilat; no bruits. No lymphadenopathy or thryomegaly appreciated. Cor: PMI nondisplaced. Regular rate & rhythm. No rubs, gallops or murmurs. Lungs: clear Abdomen: soft, nontender, nondistended. No hepatosplenomegaly. No bruits or masses. Good bowel sounds. Extremities: no cyanosis, clubbing, rash, edema Neuro: alert & oriented x 3, cranial nerves grossly intact. moves all 4 extremities w/o difficulty. Affect pleasant.  ASSESSMENT & PLAN: 1. Chronic systolic HF - EF 123456 echo 1/17. Cath 3/17 EF 30-35% normal cors --NYHA II. Volume status looks good. Can decrease lasix to every other day --I suspect she has a tachy-induced CM. Event monitor with 7-beat NSVT no SVT --Continue spiro 12.5, carvedilol to 12.5 bid --Continue lisinopril with goal to eventually switch to [facility] --May need sleep study at some point though symptoms not overly convincing for severe OSA --Repeat echo 2. SVT --as above. Will place event monitor. I 3. NSVT --Repeat echo. If EF 35 or less will need ICD --Check K and mag --Continue b-blocker   Shalise Rosado,MD 10:11 AM

## 2016-01-29 ENCOUNTER — Encounter (HOSPITAL_COMMUNITY): Payer: Self-pay | Admitting: Internal Medicine

## 2016-01-31 ENCOUNTER — Ambulatory Visit: Payer: Managed Care, Other (non HMO) | Admitting: Cardiovascular Disease

## 2016-01-31 ENCOUNTER — Ambulatory Visit
Admission: RE | Admit: 2016-01-31 | Discharge: 2016-01-31 | Disposition: A | Discharge status: Home / Self Care | Payer: Managed Care, Other (non HMO) | Source: Ambulatory Visit | Attending: Internal Medicine | Admitting: Internal Medicine

## 2016-01-31 DIAGNOSIS — I5022 Chronic systolic (congestive) heart failure: Secondary | ICD-10-CM | POA: Diagnosis not present

## 2016-01-31 LAB — ECHOCARDIOGRAM COMPLETE
AOPV: 0.78 m/s
AV Area VTI: 2.46 cm2
AV peak Index: 1.43
AV pk vel: 89.6 cm/s
AVPG: 3 mmHg
CHL CUP MV DEC (S): 232
CHL CUP STROKE VOLUME: 27 mL
E decel time: 232 msec
E/e' ratio: 11.86
FS: 26 % — AB (ref 28–44)
IV/PV OW: 0.89
LA diam end sys: 34 cm
LA vol A4C: 24.9 ml
LA vol: 31.6 cm3
LADIAMINDEX: 1.98 cm/m2
LASIZE: 34 cm
LAVOLIN: 18.4 mL/m2
LDCA: 3.14 cm2
LV E/e'average: 11.86
LV TDI E'LATERAL: 5.11
LV TDI E'MEDIAL: 4.13
LV e' LATERAL: 5.11 cm/s
LV sys vol index: 24 mL/m2
LV sys vol: 41 mL (ref 14–42)
LVDIAVOL: 68 mL (ref 46–106)
LVDIAVOLIN: 39 mL/m2
LVEEMED: 11.86
LVOTD: 20 cm
LVOTPV: 70.3 m/s
MV pk E vel: 60.6 m/s
MVPKAVEL: 80 m/s
PW: 11.5 mm — AB (ref 0.6–1.1)
Simpson's disk: 40
TAPSE: 15.1 cm

## 2016-01-31 MED ORDER — FUROSEMIDE 20 MG PO TABS: 20.0000 mg | ORAL_TABLET | ORAL | Status: DC

## 2016-01-31 NOTE — Progress Notes (Signed)
*  PRELIMINARY RESULTS* Echocardiogram 2D Echocardiogram has been performed.  Kristi Greer 01/31/2016, 11:22 AM

## 2016-02-02 ENCOUNTER — Telehealth (HOSPITAL_COMMUNITY): Payer: Self-pay

## 2016-02-02 NOTE — Telephone Encounter (Signed)
Stable echo results per Dr. Haroldine Laws reviewed with patient. No questions/concers at this time, reports feeling great, next scheduled apt with Dr. Haroldine Laws confirmed with patient.  [name]

## 2016-02-02 NOTE — Telephone Encounter (Signed)
Patient left VM on CHF triage line that BP 107/59. Looks like this is her norm at times from looking in patient's chart. Left return VM to call us back to let us know how she is feeling and if she is symptomatic with this BP, also if this is before or after taking her am meds.  [name]

## 2016-02-04 ENCOUNTER — Other Ambulatory Visit: Payer: Self-pay | Admitting: Internal Medicine

## 2016-02-10 ENCOUNTER — Encounter: Payer: Self-pay | Admitting: Internal Medicine

## 2016-02-10 ENCOUNTER — Ambulatory Visit (INDEPENDENT_AMBULATORY_CARE_PROVIDER_SITE_OTHER): Payer: Managed Care, Other (non HMO) | Admitting: Internal Medicine

## 2016-02-10 VITALS — BP 100/50 | HR 71 | Temp 97.9°F | Resp 18 | Ht 61.0 in | Wt 161.1 lb

## 2016-02-10 DIAGNOSIS — Z1239 Encounter for other screening for malignant neoplasm of breast: Secondary | ICD-10-CM | POA: Diagnosis not present

## 2016-02-10 DIAGNOSIS — I950 Idiopathic hypotension: Secondary | ICD-10-CM

## 2016-02-10 DIAGNOSIS — E7800 Pure hypercholesterolemia, unspecified: Secondary | ICD-10-CM | POA: Diagnosis not present

## 2016-02-10 DIAGNOSIS — I5022 Chronic systolic (congestive) heart failure: Secondary | ICD-10-CM

## 2016-02-10 DIAGNOSIS — I420 Dilated cardiomyopathy: Secondary | ICD-10-CM

## 2016-02-10 DIAGNOSIS — R928 Other abnormal and inconclusive findings on diagnostic imaging of breast: Secondary | ICD-10-CM | POA: Diagnosis not present

## 2016-02-10 DIAGNOSIS — I471 Supraventricular tachycardia: Secondary | ICD-10-CM | POA: Diagnosis not present

## 2016-02-10 NOTE — Progress Notes (Signed)
Pre-visit discussion using our clinic review tool. No additional management support is needed unless otherwise documented below in the visit note.  

## 2016-02-10 NOTE — Progress Notes (Signed)
[name], female   DOB: 1951-04-22, 65 y.o.   MRN: [id number]   Subjective:    [name], female    DOB: 03/18/1951, 65 y.o.   MRN: [id number]  HPI  Patient here for a scheduled follow up.  She recently saw cardiology.  Heart function improved.  EF 45% now.  She feels better.  Breathing better.  Energy better.  States she feels better than she has in a long time.  No chest pain.  No acid reflux reported.  No abdominal pain or cramping.  Bowels stable.  Blood pressure has been averaging 100-112/50-60s.  No dizziness or light headedness.     Past Medical History  Diagnosis Date  . Hyperlipidemia   . Migraines   . Hypertension   . Allergy   . Personal history of colonic polyps   . Cancer ([place])     basal cell  . Congestive heart failure [facility])    Past Surgical History  Procedure Laterality Date  . Tubal ligation    . Finger surgery    . Cholecystectomy  2006  . Vaginal hysterectomy      midurethral sling  . Hysteroscopy  2011, 10/2010  . Cardiac catheterization N/A 11/26/2015    Procedure: Right/Left Heart Cath and Coronary Angiography;  Surgeon: Jolaine Artist, MD;  Location: [facility];  Service: Cardiovascular;  Laterality: N/A;   Family History  Problem Relation Age of Onset  . Hypertension Mother   . Stroke Mother   . Hypercholesterolemia Father   . Diabetes Mother   . Heart disease Father     MI  . Hypercholesterolemia Father   . Hypertension Father   . Stroke Father   . Breast cancer Neg Hx   . Colon cancer Neg Hx    Social History   Social History  . Marital Status: Married    Spouse Name: N/A  . Number of Children: 2  . Years of Education: N/A   Social History Main Topics  . Smoking status: Never Smoker   . Smokeless tobacco: Never Used  . Alcohol Use: 0.0 oz/week    0 Standard drinks or equivalent per week     Comment: Socially  . Drug Use: No  . Sexual Activity: Not Asked   Other Topics  Concern  . None   Social History Narrative    Outpatient Encounter Prescriptions as of 02/10/2016  Medication Sig  . aspirin 81 MG tablet Take 81 mg by mouth daily.  . calcium carbonate (OS-CAL) 600 MG TABS Take 600 mg by mouth at bedtime.   . carvedilol (COREG) 12.5 MG tablet Take 1 tablet (12.5 mg total) by mouth 2 (two) times daily with a meal.  . chlorpheniramine-HYDROcodone (TUSSIONEX) 10-8 MG/5ML SUER Take 5 mLs by mouth every 12 (twelve) hours as needed for cough.  . Cholecalciferol 1000 units capsule Take 1,000 Units by mouth daily.  . fexofenadine (ALLEGRA) 180 MG tablet Take 180 mg by mouth at bedtime.   . furosemide (LASIX) 20 MG tablet Take 1 tablet (20 mg total) by mouth every other day.  . ketorolac (ACULAR) 0.5 % ophthalmic solution Place 1 drop into the right eye 4 (four) times daily.   Marland Kitchen lisinopril (PRINIVIL,ZESTRIL) 5 MG tablet Take 5 mg by mouth at bedtime. Reported on 11/11/2015  . ofloxacin (OCUFLOX) 0.3 % ophthalmic solution Place 1 drop into the right eye 4 (four) times daily.   Marland Kitchen omeprazole (PRILOSEC) 20 MG capsule  TAKE ONE CAPSULE BY MOUTH TWICE A DAY BEFORE A MEAL  . prednisoLONE acetate (PRED FORTE) 1 % ophthalmic suspension Place 1 drop into both eyes. 6 times in left eye & 2 times in right eye  . simvastatin (ZOCOR) 10 MG tablet TAKE 1 TABLET (10 MG TOTAL) BY MOUTH DAILY AT 6 PM.  . spironolactone (ALDACTONE) 25 MG tablet Take 0.5 tablets (12.5 mg total) by mouth daily.  . SUMAtriptan (IMITREX) 100 MG tablet Take 100 mg by mouth every 2 (two) hours as needed for migraine. May repeat in 2 hours if headache persists or recurs.  Marland Kitchen zonisamide (ZONEGRAN) 100 MG capsule Take 1 capsule (100 mg total) by mouth at bedtime.  . [DISCONTINUED] hydroxypropyl methylcellulose / hypromellose (ISOPTO TEARS / GONIOVISC) 2.5 % ophthalmic solution Place 1 drop into both eyes 2 (two) times daily.  . [DISCONTINUED] nystatin cream (MYCOSTATIN) Apply 1 application topically 2 (two) times  daily.  . [DISCONTINUED] Olopatadine HCl (PAZEO) 0.7 % SOLN Place 1 drop into both eyes every morning.    No facility-administered encounter medications on file as of 02/10/2016.    Review of Systems  Constitutional: Negative for appetite change and unexpected weight change.  HENT: Negative for congestion and sinus pressure.   Respiratory: Negative for cough, chest tightness and shortness of breath.   Cardiovascular: Negative for chest pain, palpitations and leg swelling.  Gastrointestinal: Negative for nausea, vomiting, abdominal pain and diarrhea.  Genitourinary: Negative for dysuria and difficulty urinating.  Musculoskeletal: Negative for back pain and joint swelling.  Skin: Negative for color change and rash.  Neurological: Negative for dizziness, light-headedness and headaches.  Psychiatric/Behavioral: Negative for dysphoric mood and agitation.       Objective:     Blood pressure rechecked by me:  106-108/62  Physical Exam  Constitutional: She appears well-developed and well-nourished. No distress.  HENT:  Nose: Nose normal.  Mouth/Throat: Oropharynx is clear and moist.  Neck: Neck supple. No thyromegaly present.  Cardiovascular: Normal rate and regular rhythm.   Pulmonary/Chest: Breath sounds normal. No respiratory distress. She has no wheezes.  Abdominal: Soft. Bowel sounds are normal. There is no tenderness.  Musculoskeletal: She exhibits no edema or tenderness.  Lymphadenopathy:    She has no cervical adenopathy.  Skin: No rash noted. No erythema.  Psychiatric: She has a normal mood and affect. Her behavior is normal.    BP 100/50 mmHg  Pulse 71  Temp(Src) 97.9 F (36.6 C) (Oral)  Resp 18  Ht 5\' 1"  (1.549 m)  Wt 161 lb 2 oz (73.086 kg)  BMI 30.46 kg/m2  SpO2 97%  LMP 10/18/2012 Wt Readings from Last 3 Encounters:  02/10/16 161 lb 2 oz (73.086 kg)  01/26/16 161 lb 4 oz (73.143 kg)  12/06/15 157 lb 8 oz (71.442 kg)     Lab Results  Component Value Date     WBC 12.6* 11/25/2015   HGB 13.5 11/25/2015   HCT 41.2 11/25/2015   PLT 317.0 11/25/2015   GLUCOSE 99 01/26/2016   CHOL 161 11/25/2015   TRIG 161.0* 11/25/2015   HDL 38.50* 11/25/2015   LDLCALC 90 11/25/2015   ALT 18 11/25/2015   AST 19 11/25/2015   NA 140 01/26/2016   K 4.7 01/26/2016   CL 107 01/26/2016   CREATININE 0.96 01/26/2016   BUN 18 01/26/2016   CO2 26 01/26/2016   TSH 0.46 11/25/2015   INR 0.98 11/26/2015       Assessment & Greer:   Problem List Items  Addressed This Visit    Abnormal mammogram    Mammogram 06/09/15 - [place] I.        Chronic systolic heart failure ([place])    Followed by cardiology.  Recently evaluated.  EF improved.  Continue current medication regimen.        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[place])    Followed by cardiology.  EF improved on current regimen.        Hypercholesterolemia    On simvastatin.  Low cholesterol diet and exercise.  Follow lipid panel and liver function tests.        Hypotension    Blood pressure as outlined.  Continue current medication regimen.  No dizziness.  Feels better.  Follow.        SVT (supraventricular tachycardia) ([facility])    Stable.  Followed by cardiology.  On carvedilol.         Other Visit Diagnoses    Screening breast examination    -  Primary    Relevant Orders    MM DIGITAL SCREENING BILATERAL        Einar Pheasant, MD

## 2016-02-15 ENCOUNTER — Encounter: Payer: Self-pay | Admitting: Internal Medicine

## 2016-02-15 NOTE — Assessment & Plan Note (Signed)
On simvastatin.  Low cholesterol diet and exercise.  Follow lipid panel and liver function tests.   

## 2016-02-15 NOTE — Assessment & Plan Note (Signed)
Followed by cardiology.  EF improved on current regimen.

## 2016-02-15 NOTE — Assessment & Plan Note (Signed)
Blood pressure as outlined.  Continue current medication regimen.  No dizziness.  Feels better.  Follow.

## 2016-02-15 NOTE — Assessment & Plan Note (Signed)
Stable.  Followed by cardiology.  On carvedilol.

## 2016-02-15 NOTE — Assessment & Plan Note (Signed)
Followed by cardiology.  Recently evaluated.  EF improved.  Continue current medication regimen.

## 2016-02-15 NOTE — Assessment & Plan Note (Signed)
Mammogram 06/09/15 - Birads I.

## 2016-03-02 ENCOUNTER — Encounter: Payer: Self-pay | Admitting: Internal Medicine

## 2016-03-27 ENCOUNTER — Encounter (HOSPITAL_COMMUNITY): Payer: Managed Care, Other (non HMO) | Admitting: Internal Medicine

## 2016-04-04 ENCOUNTER — Other Ambulatory Visit (HOSPITAL_COMMUNITY): Payer: Self-pay | Admitting: Internal Medicine

## 2016-04-17 ENCOUNTER — Ambulatory Visit (HOSPITAL_COMMUNITY)
Admission: RE | Admit: 2016-04-17 | Discharge: 2016-04-17 | Disposition: A | Discharge status: Home / Self Care | Payer: Managed Care, Other (non HMO) | Source: Ambulatory Visit | Attending: Internal Medicine | Admitting: Internal Medicine

## 2016-04-17 VITALS — BP 114/68 | HR 71 | Wt 161.0 lb

## 2016-04-17 DIAGNOSIS — Z823 Family history of stroke: Secondary | ICD-10-CM | POA: Insufficient documentation

## 2016-04-17 DIAGNOSIS — I472 Ventricular tachycardia: Secondary | ICD-10-CM | POA: Insufficient documentation

## 2016-04-17 DIAGNOSIS — Z8249 Family history of ischemic heart disease and other diseases of the circulatory system: Secondary | ICD-10-CM | POA: Insufficient documentation

## 2016-04-17 DIAGNOSIS — Z85828 Personal history of other malignant neoplasm of skin: Secondary | ICD-10-CM | POA: Insufficient documentation

## 2016-04-17 DIAGNOSIS — Z6830 Body mass index (BMI) 30.0-30.9, adult: Secondary | ICD-10-CM | POA: Diagnosis not present

## 2016-04-17 DIAGNOSIS — E785 Hyperlipidemia, unspecified: Secondary | ICD-10-CM | POA: Insufficient documentation

## 2016-04-17 DIAGNOSIS — I5022 Chronic systolic (congestive) heart failure: Secondary | ICD-10-CM | POA: Insufficient documentation

## 2016-04-17 DIAGNOSIS — Z833 Family history of diabetes mellitus: Secondary | ICD-10-CM | POA: Insufficient documentation

## 2016-04-17 DIAGNOSIS — I471 Supraventricular tachycardia: Secondary | ICD-10-CM | POA: Diagnosis not present

## 2016-04-17 DIAGNOSIS — E669 Obesity, unspecified: Secondary | ICD-10-CM | POA: Diagnosis not present

## 2016-04-17 DIAGNOSIS — I110 Hypertensive heart disease with heart failure: Secondary | ICD-10-CM | POA: Insufficient documentation

## 2016-04-17 DIAGNOSIS — Z7982 Long term (current) use of aspirin: Secondary | ICD-10-CM | POA: Diagnosis not present

## 2016-04-17 DIAGNOSIS — Z79899 Other long term (current) drug therapy: Secondary | ICD-10-CM | POA: Diagnosis not present

## 2016-04-17 DIAGNOSIS — I4729 Other ventricular tachycardia: Secondary | ICD-10-CM

## 2016-04-17 LAB — BASIC METABOLIC PANEL
Anion gap: 7 (ref 5–15)
BUN: 17 mg/dL (ref 6–20)
CALCIUM: 9.5 mg/dL (ref 8.9–10.3)
CO2: 27 mmol/L (ref 22–32)
CREATININE: 0.94 mg/dL (ref 0.44–1.00)
Chloride: 106 mmol/L (ref 101–111)
Glucose, Bld: 95 mg/dL (ref 65–99)
Potassium: 4.4 mmol/L (ref 3.5–5.1)
SODIUM: 140 mmol/L (ref 135–145)

## 2016-04-17 NOTE — Progress Notes (Signed)
Advanced Heart Failure Medication Review by a Pharmacist  Does the patient  feel that his/her medications are working for him/her?  yes  Has the patient been experiencing any side effects to the medications prescribed?  no  Does the patient measure his/her own blood pressure or blood glucose at home?  no   Does the patient have any problems obtaining medications due to transportation or finances?   no  Understanding of regimen: good Understanding of indications: good Potential of compliance: good Patient understands to avoid NSAIDs. Patient understands to avoid decongestants.  Issues to address at subsequent visits: None   Pharmacist comments:  Kristi Greer is a pleasant 65 yo F presenting with a current medication list. She reports good compliance with her regimen and did not have any specific medication-related questions or concerns for me at this time.   Ruta Hinds. Velva Harman, PharmD, BCPS, CPP Clinical Pharmacist Pager: [phone] Phone: [phone] 04/17/2016 12:39 PM      Time with patient: 10 minutes Preparation and documentation time: 2 minutes Total time: 12 minutes

## 2016-04-17 NOTE — Patient Instructions (Signed)
Routine lab work today. Will notify you of abnormal results, otherwise no news is good news!  Will schedule you for Cardiac MRI at [facility]. Radiology department will call you to schedule.  Follow up 3 months with Dr. Haroldine Laws. We will call you closer to this time, or you may call our office to schedule 1 month before you are due to be seen.  Do the following things EVERYDAY: 1) Weigh yourself in the morning before breakfast. Write it down and keep it in a log. 2) Take your medicines as prescribed 3) Eat low salt foods-Limit salt (sodium) to 2000 mg per day.  4) Stay as active as you can everyday 5) Limit all fluids for the day to less than 2 liters

## 2016-04-17 NOTE — Progress Notes (Signed)
[name], female   DOB: [birth year], 65 y.o.   MRN: [id number]    Advanced Heart Failure Clinic Note   Referring Physician: Dr.Scott Primary Care: Einar Pheasant Primary Cardiologist: Paraschos HF: Dr. Haroldine Laws   HPI:  Kristi Greer is a 65 y/o woman with h/o obesity, migraines and HTN referred by Dr. Nicki Reaper for further evaluation of CHF.  Started feeling short of breath in October 2016. Thought she had bronchitis.   In January 2017 admitted at [facility] with acute CHF and SVT at 150. (No palpitations at the time) Received adenosine but didn't break. (slowed down and ECG looked like possible atrial tach). Eventually converted spontaneously. Echo EF 35-40%. TSH was low at 0.18 but FT4 and FT3 were fine. Felt to possible have hyperthyroidism. Seen by endocrine who started tapazole and a b-blocker in house. However, had f/u with Endo and they stopped the tapazole saying her thyroid wasn't the problem  Underwent cath 11/26/15 with NICM and well compensated hemodynamics. Monitor placed to look for recurrent SVT.   She presents today for regular follow up.  Echo since last visit reviewed. Overall feels great.  States hear breathing is much better than it was.   No SOB on flat ground, bathing, or changing clothes.  Gets some DOE on uphill.  No set aside exercise, but intentionally walks. Weight stable. Denies CP, lightheadedness, or dizziness.   On 10/11/15 had Myoview EF 30% No ischemia or scar.  Event monitor 4/17: NSR one 7 beat NSVT. No SVT  Echo 01/31/16 LVEF 40-45%, Grade 1 DD, Normal RV  R/L cath 11/26/15:  Ao = 104/61 (78) LV = 107/6/14 RA = 4 RV = 28/4/8 PA = 25/8 (16) PCW = 7 Fick cardiac output/index = 4.7/2.8 PVR = 2.0 WU SVR = 1265 FA sat = 99% PA sat = 71%, 74%  Assessment: 1. Normal coronary arteries 2. 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 Well compensated hemodynamics  SH: Never smoker. Rarely uses ETOH. No family H/o SCD. Multiple family  members with CAD.    Past Medical History:  Diagnosis Date  . Allergy   . Cancer ([place])    basal cell  . Congestive heart failure ([place])   . Hyperlipidemia   . Hypertension   . Migraines   . Personal history of colonic polyps     Current Outpatient Prescriptions  Medication Sig Dispense Refill  . aspirin 81 MG tablet Take 81 mg by mouth daily.    . carvedilol (COREG) 12.5 MG tablet TAKE 1 TABLET (12.5 MG TOTAL) BY MOUTH 2 (TWO) TIMES DAILY WITH A MEAL. 60 tablet 3  . Cholecalciferol 1000 units capsule Take 1,000 Units by mouth daily.    . fexofenadine (ALLEGRA) 180 MG tablet Take 180 mg by mouth at bedtime.     . furosemide (LASIX) 20 MG tablet Take 1 tablet (20 mg total) by mouth every other day. 90 tablet 1  . lisinopril (PRINIVIL,ZESTRIL) 5 MG tablet Take 5 mg by mouth at bedtime. Reported on 11/11/2015    . omeprazole (PRILOSEC) 20 MG capsule TAKE ONE CAPSULE BY MOUTH TWICE A DAY BEFORE A MEAL 60 capsule 5  . OVER THE COUNTER MEDICATION Take 1 tablet by mouth daily.    Marland Kitchen OVER THE COUNTER MEDICATION Take 1 packet by mouth 2 (two) times daily.    . simvastatin (ZOCOR) 10 MG tablet TAKE 1 TABLET (10 MG TOTAL) BY MOUTH DAILY AT 6 PM. 30 tablet 11  . spironolactone (ALDACTONE) 25 MG  tablet Take 0.5 tablets (12.5 mg total) by mouth daily. 45 tablet 3  . SUMAtriptan (IMITREX) 100 MG tablet Take 100 mg by mouth every 2 (two) hours as needed for migraine. May repeat in 2 hours if headache persists or recurs.    Marland Kitchen zonisamide (ZONEGRAN) 100 MG capsule Take 1 capsule (100 mg total) by mouth at bedtime. 30 capsule 1   No current facility-administered medications for this encounter.     No Known Allergies    Social History   Social History  . Marital status: Married    Spouse name: N/A  . Number of children: 2  . Years of education: N/A   Occupational History  . Not on file.   Social History Main Topics  . Smoking status: Never Smoker  . Smokeless tobacco: Never Used  . Alcohol  use 0.0 oz/week     Comment: Socially  . Drug use: No  . Sexual activity: Not on file   Other Topics Concern  . Not on file   Social History Narrative  . No narrative on file      Family History  Problem Relation Age of Onset  . Hypertension Mother   . Stroke Mother   . Hypercholesterolemia Father   . Diabetes Mother   . Heart disease Father     MI  . Hypercholesterolemia Father   . Hypertension Father   . Stroke Father   . Breast cancer Neg Hx   . Colon cancer Neg Hx     Vitals:   04/17/16 1211  BP: 114/68  Pulse: 71  SpO2: 97%  Weight: 161 lb (73 kg)   Wt Readings from Last 3 Encounters:  04/17/16 161 lb (73 kg)  02/10/16 161 lb 2 oz (73.1 kg)  01/26/16 161 lb 4 oz (73.1 kg)   PHYSICAL EXAM: General:  Well appearing. No respiratory difficulty HEENT: normal Neck: supple. no JVD. Carotids 2+ bilat; no bruits. No thyromegaly or nodule noted.  Cor: PMI nondisplaced. RRR. No M/G/R Lungs: CTAB, normal effort Abdomen: soft, NT, ND, no HSM. No bruits or masses. +BS  Extremities: no cyanosis, clubbing, rash, edema Neuro: alert & oriented x 3, cranial nerves grossly intact. moves all 4 extremities w/o difficulty. Affect pleasant.  ASSESSMENT & PLAN: 1. Chronic systolic HF - EF 123456 echo 1/17. Cath 3/17 EF 30-35% normal cors - Repeat Echo 01/2016 EF 40-45%, Grade 1 DD. Suspect she has a tachy-induced CM. Event monitor with 7-beat NSVT no SVT - NYHA II. Volume status looks good. - Continue lasix 20 mg every other day. Can take addition as needed.  - Continue spironolactone 12.5 mg daily.  - Continue Coreg 12.5 mg BID.  - Continue lisinopril 5 mg daily. Can't switch to [facility] with recent EF > 40%.  - May need sleep study at some point though symptoms not overly convincing for severe OSA - With multiple EF ranges and NSVT, recommend cMRI for definitive.  2. SVT - Event monitor showed tachy 2% of the time with one 7 beat run NSVT noted.  3. NSVT - Repeat echo with  EF stable above ICD range, but have had various difference on multiple checks this year. Recommend cMRI for definitive measurement of EF - Continue b-blocker at dose above.   BMET today. cMRI as above. Follow up 3 months.   Shirley Friar, PA-C 12:27 PM

## 2016-04-19 ENCOUNTER — Telehealth (HOSPITAL_COMMUNITY): Payer: Self-pay | Admitting: *Deleted

## 2016-04-19 NOTE — Telephone Encounter (Signed)
Pre cert approved through [facility] for cardiac MRI Service Order: [id number]  Case Status: Approved  Authorization Number:  Auth Effective Date: 04/19/2016  Auth End Date: 07/18/2016  Initiated Date: 04/19/2016  Decision Date: 04/19/2016  Decision Type:  Initial

## 2016-04-19 NOTE — Telephone Encounter (Signed)
Pre Cert approval [id number]

## 2016-04-20 ENCOUNTER — Encounter: Payer: Self-pay | Admitting: Student

## 2016-04-27 ENCOUNTER — Ambulatory Visit (HOSPITAL_COMMUNITY)
Admission: RE | Admit: 2016-04-27 | Discharge: 2016-04-27 | Disposition: A | Discharge status: Home / Self Care | Payer: Managed Care, Other (non HMO) | Source: Ambulatory Visit | Attending: Student | Admitting: Student

## 2016-04-27 DIAGNOSIS — I340 Nonrheumatic mitral (valve) insufficiency: Secondary | ICD-10-CM | POA: Insufficient documentation

## 2016-04-27 DIAGNOSIS — I071 Rheumatic tricuspid insufficiency: Secondary | ICD-10-CM | POA: Insufficient documentation

## 2016-04-27 DIAGNOSIS — I5022 Chronic systolic (congestive) heart failure: Secondary | ICD-10-CM | POA: Insufficient documentation

## 2016-04-27 MED ORDER — GADOBENATE DIMEGLUMINE 529 MG/ML IV SOLN
20.0000 mL | Freq: Once | INTRAVENOUS | Status: AC | PRN
Start: 2016-04-27 — End: 2016-04-27
  Administered 2016-04-27: 20 mL via INTRAVENOUS

## 2016-05-03 ENCOUNTER — Telehealth: Payer: Self-pay | Admitting: Internal Medicine

## 2016-05-03 NOTE — Telephone Encounter (Signed)
Patient has been scheduled for tomorrow with Dr. Lacinda Axon @0830 

## 2016-05-03 NOTE — Telephone Encounter (Signed)
Patient missed call to be reminded of appointment. Patient confirmed appointment for tomorrow morning.

## 2016-05-03 NOTE — Telephone Encounter (Signed)
Left message for patient to return call back.  

## 2016-05-03 NOTE — Telephone Encounter (Signed)
Patient is aware of her appt for tomorrow, however she stated that she received another call,. Please return call to [phone]

## 2016-05-03 NOTE — Telephone Encounter (Signed)
Pt sent a Mychart message regarding Stomach hurting/sharp pain cannot lay on right side. No vomiting. Pt did feel some nausea with a lot of gas. Not sure what's going on. No appt avail to sch. Pt has a appt on 09/22.  Pt only wants to see Dr Nicki Reaper. Please advise?  Call pt @ [phone]. Thank you!

## 2016-05-04 ENCOUNTER — Encounter: Payer: Self-pay | Admitting: Family Medicine

## 2016-05-04 ENCOUNTER — Ambulatory Visit (INDEPENDENT_AMBULATORY_CARE_PROVIDER_SITE_OTHER): Payer: Managed Care, Other (non HMO) | Admitting: Family Medicine

## 2016-05-04 DIAGNOSIS — R1013 Epigastric pain: Secondary | ICD-10-CM | POA: Insufficient documentation

## 2016-05-04 MED ORDER — PANTOPRAZOLE SODIUM 40 MG PO TBEC: 40.0000 mg | DELAYED_RELEASE_TABLET | Freq: Every day | ORAL | 3 refills | Status: DC

## 2016-05-04 NOTE — Progress Notes (Signed)
Subjective:  Patient ID: Marvel Plan, female    DOB: 10-08-1950  Age: 65 y.o. MRN: [id number]  CC: Abdominal pain  HPI:  65 year old female presents with the above complaint.  Patient reports a one-week history of epigastric/right upper quadrant pain. She reports associated gas and belching. Pain is moderate in severity. She's been taking her Prilosec without improvement. She is also taking Tums without improvement. Does not seem to be related to diet/PO intake. No known exacerbating or relieving factors. No other associated symptoms. No other complaints at this time.  Social Hx   Social History   Social History  . Marital status: Married    Spouse name: N/A  . Number of children: 2  . Years of education: N/A   Social History Main Topics  . Smoking status: Never Smoker  . Smokeless tobacco: Never Used  . Alcohol use 0.0 oz/week     Comment: Socially  . Drug use: No  . Sexual activity: Not Asked   Other Topics Concern  . None   Social History Narrative  . None    Review of Systems  Constitutional: Negative.   Gastrointestinal: Positive for abdominal pain.       Gas, belching.    Objective:  BP 107/70 (BP Location: Right Arm, Patient Position: Sitting, Cuff Size: Normal)   Pulse 72   Temp 98.1 F (36.7 C) (Oral)   Wt 163 lb 8 oz (74.2 kg)   LMP 10/18/2012   SpO2 97%   BMI 30.89 kg/m   BP/Weight 05/04/2016 04/17/2016 [id number]  Systolic BP [id number]  Diastolic BP 70 68 50  Wt. (Lbs) 163.5 161 161.13  BMI 30.89 30.42 30.46    Physical Exam  Constitutional: She is oriented to person, place, and time. She appears well-developed. No distress.  Cardiovascular: Normal rate and regular rhythm.   Pulmonary/Chest: Effort normal. She has no wheezes. She has no rales.  Abdominal: Soft.  Tender to palpation in the epigastric region. No rebound or guarding. No right upper quadrant tenderness.  Neurological: She is alert and oriented to person, place, and  time.  Psychiatric: She has a normal mood and affect.  Vitals reviewed.   Lab Results  Component Value Date   WBC 12.6 (H) 11/25/2015   HGB 13.5 11/25/2015   HCT 41.2 11/25/2015   PLT 317.0 11/25/2015   GLUCOSE 95 04/17/2016   CHOL 161 11/25/2015   TRIG 161.0 (H) 11/25/2015   HDL 38.50 (L) 11/25/2015   LDLCALC 90 11/25/2015   ALT 18 11/25/2015   AST 19 11/25/2015   NA 140 04/17/2016   K 4.4 04/17/2016   CL 106 04/17/2016   CREATININE 0.94 04/17/2016   BUN 17 04/17/2016   CO2 27 04/17/2016   TSH 0.46 11/25/2015   INR 0.98 11/26/2015    Assessment & Plan:   Problem List Items Addressed This Visit    Epigastric abdominal pain    New acute problem. Likely Gastritis vs GERD vs PUD. Stopping Omeprazole. Starting Protonix. Call if worsens/fails to improve. Will send to GI if fails to improve/worsens.       Other Visit Diagnoses   None.     Meds ordered this encounter  Medications  . pantoprazole (PROTONIX) 40 MG tablet    Sig: Take 1 tablet (40 mg total) by mouth daily.    Dispense:  30 tablet    Refill:  3    Follow-up: PRN  [place]

## 2016-05-04 NOTE — Assessment & Plan Note (Signed)
New acute problem. Likely Gastritis vs GERD vs PUD. Stopping Omeprazole. Starting Protonix. Call if worsens/fails to improve. Will send to GI if fails to improve/worsens.

## 2016-05-04 NOTE — Progress Notes (Signed)
Pre visit review using our clinic review tool, if applicable. No additional management support is needed unless otherwise documented below in the visit note. 

## 2016-05-04 NOTE — Patient Instructions (Signed)
Take the medication daily as prescribed.  Call if you fail to improve.  Take care  Dr. Lacinda Axon

## 2016-05-19 ENCOUNTER — Ambulatory Visit (INDEPENDENT_AMBULATORY_CARE_PROVIDER_SITE_OTHER): Payer: Managed Care, Other (non HMO) | Admitting: Internal Medicine

## 2016-05-19 ENCOUNTER — Telehealth: Payer: Self-pay | Admitting: Internal Medicine

## 2016-05-19 ENCOUNTER — Encounter: Payer: Self-pay | Admitting: Internal Medicine

## 2016-05-19 VITALS — BP 102/60 | HR 83 | Temp 97.6°F | Ht 61.0 in | Wt 163.8 lb

## 2016-05-19 DIAGNOSIS — I420 Dilated cardiomyopathy: Secondary | ICD-10-CM | POA: Diagnosis not present

## 2016-05-19 DIAGNOSIS — E7800 Pure hypercholesterolemia, unspecified: Secondary | ICD-10-CM

## 2016-05-19 DIAGNOSIS — Z86010 Personal history of colonic polyps: Secondary | ICD-10-CM

## 2016-05-19 DIAGNOSIS — I471 Supraventricular tachycardia: Secondary | ICD-10-CM | POA: Diagnosis not present

## 2016-05-19 DIAGNOSIS — Z0000 Encounter for general adult medical examination without abnormal findings: Secondary | ICD-10-CM

## 2016-05-19 DIAGNOSIS — Z1159 Encounter for screening for other viral diseases: Secondary | ICD-10-CM

## 2016-05-19 DIAGNOSIS — R1013 Epigastric pain: Secondary | ICD-10-CM

## 2016-05-19 MED ORDER — PANTOPRAZOLE SODIUM 40 MG PO TBEC: 40.0000 mg | DELAYED_RELEASE_TABLET | Freq: Two times a day (BID) | ORAL | 1 refills | Status: DC

## 2016-05-19 NOTE — Telephone Encounter (Signed)
Pt need to return on 07/19/2016. Thank you!

## 2016-05-19 NOTE — Progress Notes (Signed)
[name], female   DOB: 04-16-1951, 65 y.o.   MRN: [id number]   Subjective:    [name], female    DOB: 1950/10/27, 66 y.o.   MRN: [id number]  HPI  Patient here for her physical exam.  She reports her energy level has improved.  Breathing stable.  No chest pain.  She does report RUQ pain.  Hurts when lies down. Had one episode of acid reflux where acid came up into her mouth.  She also reports occasionally noticing some problems swallowing.  Feels like food and liquids do not go down.  No vomiting.  No abdominal pain.  Bowels stable.    Past Medical History:  Diagnosis Date  . Allergy   . Cancer ([place])    basal cell  . Congestive heart failure ([place])   . Hyperlipidemia   . Hypertension   . Migraines   . Personal history of colonic polyps    Past Surgical History:  Procedure Laterality Date  . CARDIAC CATHETERIZATION N/A 11/26/2015   Procedure: Right/Left Heart Cath and Coronary Angiography;  Surgeon: Jolaine Artist, MD;  Location: [facility];  Service: Cardiovascular;  Laterality: N/A;  . CHOLECYSTECTOMY  2006  . FINGER SURGERY    . HYSTEROSCOPY  2011, 10/2010  . TUBAL LIGATION    . VAGINAL HYSTERECTOMY     midurethral sling   Family History  Problem Relation Age of Onset  . Hypertension Mother   . Stroke Mother   . Diabetes Mother   . Hypercholesterolemia Father   . Heart disease Father     MI  . Hypertension Father   . Stroke Father   . Breast cancer Neg Hx   . Colon cancer Neg Hx    Social History   Social History  . Marital status: Married    Spouse name: N/A  . Number of children: 2  . Years of education: N/A   Social History Main Topics  . Smoking status: Never Smoker  . Smokeless tobacco: Never Used  . Alcohol use 0.0 oz/week     Comment: Socially  . Drug use: No  . Sexual activity: Not Asked   Other Topics Concern  . None   Social History Narrative  . None    Outpatient Encounter  Prescriptions as of 05/19/2016  Medication Sig  . aspirin 81 MG tablet Take 81 mg by mouth daily.  . carvedilol (COREG) 12.5 MG tablet TAKE 1 TABLET (12.5 MG TOTAL) BY MOUTH 2 (TWO) TIMES DAILY WITH A MEAL.  Marland Kitchen Cholecalciferol 1000 units capsule Take 1,000 Units by mouth daily.  . fexofenadine (ALLEGRA) 180 MG tablet Take 180 mg by mouth at bedtime.   . furosemide (LASIX) 20 MG tablet Take 1 tablet (20 mg total) by mouth every other day.  . lisinopril (PRINIVIL,ZESTRIL) 5 MG tablet Take 5 mg by mouth at bedtime. Reported on 11/11/2015  . OVER THE COUNTER MEDICATION Take 1 tablet by mouth daily.  Marland Kitchen OVER THE COUNTER MEDICATION Take 1 packet by mouth 2 (two) times daily.  . pantoprazole (PROTONIX) 40 MG tablet Take 1 tablet (40 mg total) by mouth 2 (two) times daily before a meal.  . simvastatin (ZOCOR) 10 MG tablet TAKE 1 TABLET (10 MG TOTAL) BY MOUTH DAILY AT 6 PM.  . spironolactone (ALDACTONE) 25 MG tablet Take 0.5 tablets (12.5 mg total) by mouth daily.  . SUMAtriptan (IMITREX) 100 MG tablet Take 100 mg by mouth every 2 (two)  hours as needed for migraine. May repeat in 2 hours if headache persists or recurs.  Marland Kitchen zonisamide (ZONEGRAN) 100 MG capsule Take 1 capsule (100 mg total) by mouth at bedtime.  . [DISCONTINUED] pantoprazole (PROTONIX) 40 MG tablet Take 1 tablet (40 mg total) by mouth daily.   No facility-administered encounter medications on file as of 05/19/2016.     Review of Systems  Constitutional: Negative for appetite change and unexpected weight change.  HENT: Negative for congestion and sinus pressure.   Eyes: Negative for pain and visual disturbance.  Respiratory: Negative for cough, chest tightness and shortness of breath.   Cardiovascular: Negative for chest pain, palpitations and leg swelling.  Gastrointestinal: Negative for abdominal pain, diarrhea, nausea and vomiting.  Genitourinary: Negative for difficulty urinating and dysuria.  Musculoskeletal: Negative for back pain  and joint swelling.  Skin: Negative for color change and rash.  Neurological: Negative for dizziness, light-headedness and headaches.  Hematological: Negative for adenopathy. Does not bruise/bleed easily.  Psychiatric/Behavioral: Negative for agitation and dysphoric mood.       Objective:    Physical Exam  Constitutional: She is oriented to person, place, and time. She appears well-developed and well-nourished. No distress.  HENT:  Nose: Nose normal.  Mouth/Throat: Oropharynx is clear and moist.  Eyes: Right eye exhibits no discharge. Left eye exhibits no discharge. No scleral icterus.  Neck: Neck supple. No thyromegaly present.  Cardiovascular: Normal rate and regular rhythm.   Pulmonary/Chest: Breath sounds normal. No accessory muscle usage. No tachypnea. No respiratory distress. She has no decreased breath sounds. She has no wheezes. She has no rhonchi. Right breast exhibits no inverted nipple, no mass, no nipple discharge and no tenderness (no axillary adenopathy). Left breast exhibits no inverted nipple, no mass, no nipple discharge and no tenderness (no axilarry adenopathy).  Abdominal: Soft. Bowel sounds are normal. There is no tenderness.  Musculoskeletal: She exhibits no edema or tenderness.  Lymphadenopathy:    She has no cervical adenopathy.  Neurological: She is alert and oriented to person, place, and time.  Skin: Skin is warm. No rash noted. No erythema.  Psychiatric: She has a normal mood and affect. Her behavior is normal.    BP 102/60   Pulse 83   Temp 97.6 F (36.4 C) (Oral)   Ht 5\' 1"  (1.549 m)   Wt 163 lb 12.8 oz (74.3 kg)   LMP 10/18/2012   SpO2 97%   BMI 30.95 kg/m  Wt Readings from Last 3 Encounters:  05/19/16 163 lb 12.8 oz (74.3 kg)  05/04/16 163 lb 8 oz (74.2 kg)  04/17/16 161 lb (73 kg)     Lab Results  Component Value Date   WBC 12.6 (H) 11/25/2015   HGB 13.5 11/25/2015   HCT 41.2 11/25/2015   PLT 317.0 11/25/2015   GLUCOSE 95 04/17/2016     CHOL 161 11/25/2015   TRIG 161.0 (H) 11/25/2015   HDL 38.50 (L) 11/25/2015   LDLCALC 90 11/25/2015   ALT 18 11/25/2015   AST 19 11/25/2015   NA 140 04/17/2016   K 4.4 04/17/2016   CL 106 04/17/2016   CREATININE 0.94 04/17/2016   BUN 17 04/17/2016   CO2 27 04/17/2016   TSH 0.46 11/25/2015   INR 0.98 11/26/2015    Mr Card Morphology Wo/w Cm  Result Date: 04/28/2016 CLINICAL DATA:  65 year old female with non-ischemic cardiomyopathy, possibly tachycardia induced, non-sustained ventricular tachycardias, supraventricular tachycardias and LVEF 40-45% on the last echocardiogram in June 2017. EXAM: CARDIAC  MRI TECHNIQUE: The patient was scanned on a 1.5 Tesla GE magnet. A dedicated cardiac coil was used. Functional imaging was done using Fiesta sequences. 2,3, and 4 chamber views were done to assess for RWMA's. Modified Simpson's rule using a short axis stack was used to calculate an ejection fraction on a dedicated work Conservation officer, nature. The patient received 20 cc of Multihance. After 10 minutes inversion recovery sequences were used to assess for infiltration and scar tissue. CONTRAST:  20 cc  of Multihance FINDINGS: 1. Normal left ventricular size and thickness and mildly decreased systolic function (LVEF = 48%) with mild diffuse hypokinesis. There is no late gadolinium enhancement. LVEDD:  50 mm LVESD:  36 mm LVEDV:  132 ml LVESV:  69 ml SV:  64 ml CO:  4.2 L/min Myocardial mass:  92 g 2. Normal right ventricular size, thickness and systolic function (RVEF = 62%) with no regional wall motion abnormalities. RVEDV:  104 ml RVESV:  40 ml SV:  64 ml 3.  Normal biatrial size. 4.  Trivial mitral and tricuspid regurgitation. 5. Normal size of the aortic root, ascending aorta and pulmonary artery. 6.  No pericardial effusion. IMPRESSION: 1. Normal left ventricular size and thickness and mildly decreased systolic function (LVEF = 48%) with mild diffuse hypokinesis. There is no late gadolinium  enhancement. 2. Normal right ventricular size, thickness and systolic function (RVEF = 62%) with no regional wall motion abnormalities. 3. Trivial mitral and tricuspid regurgitation. There is no evidence for ischemic, infiltrative or inflammatory cardiomyopathy. Collectively, this is consistent with non-ischemic cardiomyopathy possibly tachycardia induced. Ena Dawley Electronically Signed   By: Ena Dawley   On: 04/28/2016 14:37       Assessment & Greer:   Problem List Items Addressed This Visit    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[place]) (Chronic)    Followed by cardiology.  ECHO as outlined.        Relevant Orders   Basic metabolic panel   Epigastric abdominal pain    Has RUQ pain/epigastric pain.  On protonix.  Had the episode of acid reflux.  Also has noticed swallowing issues as outlined.  Increased protonix to bid.  Refer to GI for evaluation and question of need for EGD.        Relevant Orders   CBC with Differential/Platelet   Ambulatory referral to Gastroenterology   Health care maintenance    Physical today 9//22/17.  PAP 04/09/15 - negative with negative HPV.  Mammogram 06/09/15- Birads I.  Scheduled for f/u mammogram.       History of colonic polyps    Colonoscopy 01/2012.  Recommended f/u colonoscopy 01/2017.        Hypercholesterolemia    On simvastatin.  Low cholesterol diet and exercise.  Follow lipid panel and liver function tests.        Relevant Orders   Lipid panel   Hepatic function panel   SVT (supraventricular tachycardia) ([facility]) (Chronic)    On carvedilol.  Stable.  Followed by cardiology.        Relevant Orders   TSH    Other Visit Diagnoses    Routine general medical examination at a health care facility    -  Primary   Need for hepatitis C screening test       Relevant Orders   Hepatitis C antibody       Einar Pheasant, MD

## 2016-05-19 NOTE — Assessment & Plan Note (Signed)
Physical today 9//22/17.  PAP 04/09/15 - negative with negative HPV.  Mammogram 06/09/15- Birads I.  Scheduled for f/u mammogram.

## 2016-05-19 NOTE — Progress Notes (Signed)
Pre visit review using our clinic review tool, if applicable. No additional management support is needed unless otherwise documented below in the visit note. 

## 2016-05-20 ENCOUNTER — Encounter: Payer: Self-pay | Admitting: Internal Medicine

## 2016-05-20 NOTE — Assessment & Plan Note (Signed)
Followed by cardiology.  ECHO as outlined.

## 2016-05-20 NOTE — Assessment & Plan Note (Signed)
Colonoscopy 01/2012.  Recommended f/u colonoscopy 01/2017.

## 2016-05-20 NOTE — Assessment & Plan Note (Signed)
On carvedilol.  Stable.  Followed by cardiology.  

## 2016-05-20 NOTE — Assessment & Plan Note (Signed)
Has RUQ pain/epigastric pain.  On protonix.  Had the episode of acid reflux.  Also has noticed swallowing issues as outlined.  Increased protonix to bid.  Refer to GI for evaluation and question of need for EGD.

## 2016-05-20 NOTE — Assessment & Plan Note (Signed)
On simvastatin.  Low cholesterol diet and exercise.  Follow lipid panel and liver function tests.   

## 2016-05-23 ENCOUNTER — Other Ambulatory Visit (INDEPENDENT_AMBULATORY_CARE_PROVIDER_SITE_OTHER): Payer: Managed Care, Other (non HMO)

## 2016-05-23 DIAGNOSIS — R1013 Epigastric pain: Secondary | ICD-10-CM | POA: Diagnosis not present

## 2016-05-23 DIAGNOSIS — I420 Dilated cardiomyopathy: Secondary | ICD-10-CM

## 2016-05-23 DIAGNOSIS — E7800 Pure hypercholesterolemia, unspecified: Secondary | ICD-10-CM

## 2016-05-23 DIAGNOSIS — I471 Supraventricular tachycardia, unspecified: Secondary | ICD-10-CM

## 2016-05-23 DIAGNOSIS — Z1159 Encounter for screening for other viral diseases: Secondary | ICD-10-CM

## 2016-05-23 LAB — TSH: TSH: 0.55 u[IU]/mL (ref 0.35–4.50)

## 2016-05-23 LAB — CBC WITH DIFFERENTIAL/PLATELET
Basophils Absolute: 0 10*3/uL (ref 0.0–0.1)
Basophils Relative: 0.3 % (ref 0.0–3.0)
EOS ABS: 0.3 10*3/uL (ref 0.0–0.7)
Eosinophils Relative: 3 % (ref 0.0–5.0)
HEMATOCRIT: 40.4 % (ref 36.0–46.0)
Hemoglobin: 13.7 g/dL (ref 12.0–15.0)
LYMPHS ABS: 2.5 10*3/uL (ref 0.7–4.0)
Lymphocytes Relative: 27.3 % (ref 12.0–46.0)
MCHC: 33.9 g/dL (ref 30.0–36.0)
MCV: 85.8 fl (ref 78.0–100.0)
MONO ABS: 0.5 10*3/uL (ref 0.1–1.0)
Monocytes Relative: 5.1 % (ref 3.0–12.0)
Neutro Abs: 6 10*3/uL (ref 1.4–7.7)
Neutrophils Relative %: 64.3 % (ref 43.0–77.0)
PLATELETS: 223 10*3/uL (ref 150.0–400.0)
RBC: 4.71 Mil/uL (ref 3.87–5.11)
RDW: 13.7 % (ref 11.5–15.5)
WBC: 9.3 10*3/uL (ref 4.0–10.5)

## 2016-05-23 LAB — BASIC METABOLIC PANEL
BUN: 26 mg/dL — ABNORMAL HIGH (ref 6–23)
CO2: 30 meq/L (ref 19–32)
Calcium: 9.2 mg/dL (ref 8.4–10.5)
Chloride: 107 mEq/L (ref 96–112)
Creatinine, Ser: 0.91 mg/dL (ref 0.40–1.20)
GFR: 66.27 mL/min (ref >60.00)
GLUCOSE: 92 mg/dL (ref 70–99)
POTASSIUM: 4.7 meq/L (ref 3.5–5.1)
SODIUM: 142 meq/L (ref 135–145)

## 2016-05-23 LAB — LIPID PANEL
CHOL/HDL RATIO: 3
Cholesterol: 132 mg/dL (ref 0–200)
HDL: 46.4 mg/dL (ref >39.00)
LDL CALC: 71 mg/dL (ref 0–99)
NonHDL: 85.84
Triglycerides: 76 mg/dL (ref 0.0–149.0)
VLDL: 15.2 mg/dL (ref 0.0–40.0)

## 2016-05-23 LAB — HEPATIC FUNCTION PANEL
ALT: 15 U/L (ref 0–35)
AST: 21 U/L (ref 0–37)
Albumin: 3.9 g/dL (ref 3.5–5.2)
Alkaline Phosphatase: 93 U/L (ref 39–117)
BILIRUBIN TOTAL: 0.5 mg/dL (ref 0.2–1.2)
Bilirubin, Direct: 0 mg/dL (ref 0.0–0.3)
Total Protein: 6.8 g/dL (ref 6.0–8.3)

## 2016-05-24 LAB — HEPATITIS C ANTIBODY: HCV Ab: NEGATIVE

## 2016-05-25 ENCOUNTER — Encounter: Payer: Self-pay | Admitting: Internal Medicine

## 2016-05-31 ENCOUNTER — Other Ambulatory Visit: Payer: Managed Care, Other (non HMO)

## 2016-06-12 ENCOUNTER — Ambulatory Visit
Admission: RE | Admit: 2016-06-12 | Discharge: 2016-06-12 | Disposition: A | Discharge status: Home / Self Care | Payer: Managed Care, Other (non HMO) | Source: Ambulatory Visit | Attending: Internal Medicine | Admitting: Internal Medicine

## 2016-06-12 ENCOUNTER — Ambulatory Visit: Payer: Managed Care, Other (non HMO)

## 2016-06-12 DIAGNOSIS — Z1231 Encounter for screening mammogram for malignant neoplasm of breast: Secondary | ICD-10-CM | POA: Insufficient documentation

## 2016-06-12 DIAGNOSIS — Z1239 Encounter for other screening for malignant neoplasm of breast: Secondary | ICD-10-CM

## 2016-07-19 ENCOUNTER — Other Ambulatory Visit: Payer: Self-pay | Admitting: Internal Medicine

## 2016-07-28 ENCOUNTER — Ambulatory Visit (INDEPENDENT_AMBULATORY_CARE_PROVIDER_SITE_OTHER): Payer: Managed Care, Other (non HMO) | Admitting: Internal Medicine

## 2016-07-28 ENCOUNTER — Encounter: Payer: Self-pay | Admitting: Internal Medicine

## 2016-07-28 ENCOUNTER — Ambulatory Visit: Payer: Managed Care, Other (non HMO) | Admitting: Internal Medicine

## 2016-07-28 DIAGNOSIS — I420 Dilated cardiomyopathy: Secondary | ICD-10-CM

## 2016-07-28 DIAGNOSIS — I471 Supraventricular tachycardia: Secondary | ICD-10-CM | POA: Diagnosis not present

## 2016-07-28 DIAGNOSIS — Z86010 Personal history of colonic polyps: Secondary | ICD-10-CM | POA: Diagnosis not present

## 2016-07-28 DIAGNOSIS — E7800 Pure hypercholesterolemia, unspecified: Secondary | ICD-10-CM

## 2016-07-28 DIAGNOSIS — R1013 Epigastric pain: Secondary | ICD-10-CM

## 2016-07-28 NOTE — Progress Notes (Signed)
[name], female   DOB: 1951-08-24, 65 y.o.   MRN: [id number]   Subjective:    [name], female    DOB: 1950/09/24, 65 y.o.   MRN: [id number]  HPI  Patient here for a scheduled follow up.  She is doing well.  Feels good.  Breathing better.  No chest pain.  No acid reflux.  No right side pain.  She is taking protonix.  Planning for f/u EGD in 09/2016.  Also due a f/u colonoscopy next year.  Eating and drinking well.  Blood pressure has been doing better.  Not as low.  No abdominal pain.  No bowel change.     Past Medical History:  Diagnosis Date  . Allergy   . Cancer ([place])    basal cell  . Congestive heart failure ([place])   . Hyperlipidemia   . Hypertension   . Migraines   . Personal history of colonic polyps    Past Surgical History:  Procedure Laterality Date  . CARDIAC CATHETERIZATION N/A 11/26/2015   Procedure: Right/Left Heart Cath and Coronary Angiography;  Surgeon: Jolaine Artist, MD;  Location: [facility];  Service: Cardiovascular;  Laterality: N/A;  . CHOLECYSTECTOMY  2006  . FINGER SURGERY    . HYSTEROSCOPY  2011, 10/2010  . TUBAL LIGATION    . VAGINAL HYSTERECTOMY     midurethral sling   Family History  Problem Relation Age of Onset  . Hypertension Mother   . Stroke Mother   . Diabetes Mother   . Hypercholesterolemia Father   . Heart disease Father     MI  . Hypertension Father   . Stroke Father   . Breast cancer Neg Hx   . Colon cancer Neg Hx    Social History   Social History  . Marital status: Married    Spouse name: N/A  . Number of children: 2  . Years of education: N/A   Social History Main Topics  . Smoking status: Never Smoker  . Smokeless tobacco: Never Used  . Alcohol use 0.0 oz/week     Comment: Socially  . Drug use: No  . Sexual activity: Not Asked   Other Topics Concern  . None   Social History Narrative  . None    Outpatient Encounter Prescriptions as of 07/28/2016    Medication Sig  . aspirin 81 MG tablet Take 81 mg by mouth daily.  . carvedilol (COREG) 12.5 MG tablet TAKE 1 TABLET (12.5 MG TOTAL) BY MOUTH 2 (TWO) TIMES DAILY WITH A MEAL.  Marland Kitchen Cholecalciferol 1000 units capsule Take 1,000 Units by mouth daily.  . fexofenadine (ALLEGRA) 180 MG tablet Take 180 mg by mouth at bedtime.   . furosemide (LASIX) 20 MG tablet Take 1 tablet (20 mg total) by mouth every other day.  . lisinopril (PRINIVIL,ZESTRIL) 5 MG tablet Take 5 mg by mouth at bedtime. Reported on 11/11/2015  . OVER THE COUNTER MEDICATION Take 1 tablet by mouth daily.  Marland Kitchen OVER THE COUNTER MEDICATION Take 1 packet by mouth 2 (two) times daily.  . pantoprazole (PROTONIX) 40 MG tablet Take 1 tablet (40 mg total) by mouth 2 (two) times daily before a meal.  . simvastatin (ZOCOR) 10 MG tablet TAKE 1 TABLET (10 MG TOTAL) BY MOUTH DAILY AT 6 PM.  . spironolactone (ALDACTONE) 25 MG tablet Take 0.5 tablets (12.5 mg total) by mouth daily.  . SUMAtriptan (IMITREX) 100 MG tablet Take 1 tablet (100 mg  total) by mouth every 2 (two) hours as needed for migraine. May repeat in 2 hours if headache persists or recurs.  Marland Kitchen zonisamide (ZONEGRAN) 100 MG capsule TAKE 1 CAPSULE (100 MG TOTAL) BY MOUTH ONCE DAILY. PATIENT NEEDS APPOINTMENT  . [DISCONTINUED] SUMAtriptan (IMITREX) 100 MG tablet Take 100 mg by mouth every 2 (two) hours as needed for migraine. May repeat in 2 hours if headache persists or recurs.   No facility-administered encounter medications on file as of 07/28/2016.     Review of Systems  Constitutional: Negative for appetite change and unexpected weight change.  HENT: Negative for congestion and sinus pressure.   Respiratory: Negative for cough, chest tightness and shortness of breath.   Cardiovascular: Negative for chest pain, palpitations and leg swelling.  Gastrointestinal: Negative for abdominal pain, diarrhea, nausea and vomiting.  Genitourinary: Negative for difficulty urinating and dysuria.   Musculoskeletal: Negative for back pain and joint swelling.  Skin: Negative for color change and rash.  Neurological: Negative for dizziness, light-headedness and headaches.  Psychiatric/Behavioral: Negative for agitation and dysphoric mood.       Objective:     Blood pressure rechecked by me:  110/72  Physical Exam  Constitutional: She appears well-developed and well-nourished. No distress.  HENT:  Nose: Nose normal.  Mouth/Throat: Oropharynx is clear and moist.  Neck: Neck supple. No thyromegaly present.  Cardiovascular: Normal rate and regular rhythm.   Pulmonary/Chest: Breath sounds normal. No respiratory distress. She has no wheezes.  Abdominal: Soft. Bowel sounds are normal. There is no tenderness.  Musculoskeletal: She exhibits no edema or tenderness.  Lymphadenopathy:    She has no cervical adenopathy.  Skin: No rash noted. No erythema.  Psychiatric: She has a normal mood and affect. Her behavior is normal.    BP 110/72   Pulse 79   Temp 98 F (36.7 C) (Oral)   Wt 167 lb (75.8 kg)   LMP 10/18/2012   SpO2 98%   BMI 31.55 kg/m  Wt Readings from Last 3 Encounters:  07/28/16 167 lb (75.8 kg)  05/19/16 163 lb 12.8 oz (74.3 kg)  05/04/16 163 lb 8 oz (74.2 kg)     Lab Results  Component Value Date   WBC 9.3 05/23/2016   HGB 13.7 05/23/2016   HCT 40.4 05/23/2016   PLT 223.0 05/23/2016   GLUCOSE 92 05/23/2016   CHOL 132 05/23/2016   TRIG 76.0 05/23/2016   HDL 46.40 05/23/2016   LDLCALC 71 05/23/2016   ALT 15 05/23/2016   AST 21 05/23/2016   NA 142 05/23/2016   K 4.7 05/23/2016   CL 107 05/23/2016   CREATININE 0.91 05/23/2016   BUN 26 (H) 05/23/2016   CO2 30 05/23/2016   TSH 0.55 05/23/2016   INR 0.98 11/26/2015    Mm Screening Breast Tomo Bilateral  Result Date: 06/13/2016 CLINICAL DATA:  Screening. EXAM: 2D DIGITAL SCREENING BILATERAL MAMMOGRAM WITH CAD AND ADJUNCT TOMO COMPARISON:  Previous exam(s). ACR Breast Density Category a: The breast  tissue is almost entirely fatty. FINDINGS: There are no findings suspicious for malignancy. Images were processed with CAD. IMPRESSION: No mammographic evidence of malignancy. A result letter of this screening mammogram will be mailed directly to the patient. RECOMMENDATION: Screening mammogram in one year. (Code:SM-B-01Y) BI-RADS CATEGORY  1: Negative. Electronically Signed   By: Ammie Ferrier M.D.   On: 06/13/2016 08:29       Assessment & Greer:   Problem List Items Addressed This Visit    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  [place]) (Chronic)    Doing better.  Breathing better.  Followed by cardiology.        Relevant Orders   Basic metabolic panel   Epigastric abdominal pain    Previous RUQ/epigastric pain resolved.  On protonix.  Follow.        History of colonic polyps    Saw GI.  Last colonoscopy 2013.  Due next year - f/u colonoscopy.        Hypercholesterolemia    On simvastatin.  Cholesterol just checked - bad cholesterol 71.  Low cholesterol diet and exercise.  Follow lipid panel and liver function tests.        Relevant Orders   Hepatic function panel   Lipid panel   SVT (supraventricular tachycardia) ([facility]) (Chronic)    On carvedilol.  Stable.  Followed by cardiology.            Einar Pheasant, MD

## 2016-07-28 NOTE — Progress Notes (Signed)
Pre visit review using our clinic review tool, if applicable. No additional management support is needed unless otherwise documented below in the visit note. 

## 2016-07-29 ENCOUNTER — Encounter: Payer: Self-pay | Admitting: Internal Medicine

## 2016-07-29 MED ORDER — SUMATRIPTAN SUCCINATE 100 MG PO TABS: 100.0000 mg | ORAL_TABLET | ORAL | 0 refills | Status: DC | PRN

## 2016-07-29 NOTE — Assessment & Plan Note (Signed)
Doing better.  Breathing better.  Followed by cardiology.

## 2016-07-29 NOTE — Assessment & Plan Note (Signed)
Saw GI.  Last colonoscopy 2013.  Due next year - f/u colonoscopy.

## 2016-07-29 NOTE — Assessment & Plan Note (Signed)
On carvedilol.  Stable.  Followed by cardiology.  

## 2016-07-29 NOTE — Assessment & Plan Note (Signed)
Previous RUQ/epigastric pain resolved.  On protonix.  Follow.

## 2016-07-29 NOTE — Assessment & Plan Note (Signed)
On simvastatin.  Cholesterol just checked - bad cholesterol 71.  Low cholesterol diet and exercise.  Follow lipid panel and liver function tests.

## 2016-08-05 ENCOUNTER — Encounter: Payer: Self-pay | Admitting: Internal Medicine

## 2016-08-07 NOTE — Telephone Encounter (Signed)
Patient advised of below  

## 2016-08-07 NOTE — Telephone Encounter (Signed)
I do not know her.  Since we have providers taking new pts, can offer her their names.

## 2016-08-10 ENCOUNTER — Encounter: Payer: Self-pay | Admitting: Internal Medicine

## 2016-08-11 ENCOUNTER — Ambulatory Visit (INDEPENDENT_AMBULATORY_CARE_PROVIDER_SITE_OTHER): Payer: Managed Care, Other (non HMO) | Admitting: Internal Medicine

## 2016-08-11 ENCOUNTER — Encounter: Payer: Self-pay | Admitting: Internal Medicine

## 2016-08-11 DIAGNOSIS — J329 Chronic sinusitis, unspecified: Secondary | ICD-10-CM

## 2016-08-11 MED ORDER — DOXYCYCLINE HYCLATE 100 MG PO TABS: 100.0000 mg | ORAL_TABLET | Freq: Two times a day (BID) | ORAL | 0 refills | Status: DC

## 2016-08-11 NOTE — Progress Notes (Signed)
Pre visit review using our clinic review tool, if applicable. No additional management support is needed unless otherwise documented below in the visit note. 

## 2016-08-11 NOTE — Progress Notes (Signed)
[name], female   DOB: [birth year], 65 y.o.   MRN: [id number]   Subjective:    [name], female    DOB: 1951-02-16, 66 y.o.   MRN: [id number]  HPI  Patient here as a work in with concerns regarding increased congestion.  States that symptoms began a few days ago.  Increased nasal congestion and colored mucus production.  Thick yellow mucus.  Head stopped up.  No sore throat.  No chest congestion.  No sob.  No wheezing.     Past Medical History:  Diagnosis Date  . Allergy   . Cancer ([place])    basal cell  . Congestive heart failure ([place])   . Hyperlipidemia   . Hypertension   . Migraines   . Personal history of colonic polyps    Past Surgical History:  Procedure Laterality Date  . CARDIAC CATHETERIZATION N/A 11/26/2015   Procedure: Right/Left Heart Cath and Coronary Angiography;  Surgeon: Jolaine Artist, MD;  Location: [facility];  Service: Cardiovascular;  Laterality: N/A;  . CHOLECYSTECTOMY  2006  . FINGER SURGERY    . HYSTEROSCOPY  2011, 10/2010  . TUBAL LIGATION    . VAGINAL HYSTERECTOMY     midurethral sling   Family History  Problem Relation Age of Onset  . Hypertension Mother   . Stroke Mother   . Diabetes Mother   . Hypercholesterolemia Father   . Heart disease Father     MI  . Hypertension Father   . Stroke Father   . Breast cancer Neg Hx   . Colon cancer Neg Hx    Social History   Social History  . Marital status: Married    Spouse name: N/A  . Number of children: 2  . Years of education: N/A   Social History Main Topics  . Smoking status: Never Smoker  . Smokeless tobacco: Never Used  . Alcohol use 0.0 oz/week     Comment: Socially  . Drug use: No  . Sexual activity: Not Asked   Other Topics Concern  . None   Social History Narrative  . None    Outpatient Encounter Prescriptions as of 08/11/2016  Medication Sig  . aspirin 81 MG tablet Take 81 mg by mouth daily.  . carvedilol  (COREG) 12.5 MG tablet TAKE 1 TABLET (12.5 MG TOTAL) BY MOUTH 2 (TWO) TIMES DAILY WITH A MEAL.  Marland Kitchen Cholecalciferol 1000 units capsule Take 1,000 Units by mouth daily.  . fexofenadine (ALLEGRA) 180 MG tablet Take 180 mg by mouth at bedtime.   . furosemide (LASIX) 20 MG tablet Take 1 tablet (20 mg total) by mouth every other day.  . lisinopril (PRINIVIL,ZESTRIL) 5 MG tablet Take 5 mg by mouth at bedtime. Reported on 11/11/2015  . OVER THE COUNTER MEDICATION Take 1 tablet by mouth daily.  Marland Kitchen OVER THE COUNTER MEDICATION Take 1 packet by mouth 2 (two) times daily.  . pantoprazole (PROTONIX) 40 MG tablet Take 1 tablet (40 mg total) by mouth 2 (two) times daily before a meal.  . simvastatin (ZOCOR) 10 MG tablet TAKE 1 TABLET (10 MG TOTAL) BY MOUTH DAILY AT 6 PM.  . spironolactone (ALDACTONE) 25 MG tablet Take 0.5 tablets (12.5 mg total) by mouth daily.  . SUMAtriptan (IMITREX) 100 MG tablet Take 1 tablet (100 mg total) by mouth every 2 (two) hours as needed for migraine. May repeat in 2 hours if headache persists or recurs.  Marland Kitchen zonisamide (ZONEGRAN) 100  MG capsule TAKE 1 CAPSULE (100 MG TOTAL) BY MOUTH ONCE DAILY. PATIENT NEEDS APPOINTMENT  . doxycycline (VIBRA-TABS) 100 MG tablet Take 1 tablet (100 mg total) by mouth 2 (two) times daily.   No facility-administered encounter medications on file as of 08/11/2016.     Review of Systems  Constitutional: Negative for appetite change and unexpected weight change.  HENT: Positive for congestion, postnasal drip and sinus pressure.   Respiratory: Negative for chest tightness and shortness of breath.   Cardiovascular: Negative for chest pain, palpitations and leg swelling.  Gastrointestinal: Negative for diarrhea and nausea.  Skin: Negative for color change and rash.  Neurological: Negative for dizziness, light-headedness and headaches.       Objective:    Physical Exam  Constitutional: She appears well-developed and well-nourished. No distress.  HENT:    Mouth/Throat: Oropharynx is clear and moist.  Nares - slightly erythematous turbinates.  Minimal tenderness to palpation over the sinuses.  TMs without erythema.    Neck: Neck supple.  Cardiovascular: Normal rate and regular rhythm.   Pulmonary/Chest: Breath sounds normal. No respiratory distress. She has no wheezes.  Abdominal: Soft. Bowel sounds are normal. There is no tenderness.  Lymphadenopathy:    She has no cervical adenopathy.    BP 110/68 (BP Location: Left Arm, Patient Position: Sitting, Cuff Size: Normal)   Pulse 74   Temp 97.5 F (36.4 C) (Oral)   Resp 16   Wt 168 lb 4 oz (76.3 kg)   LMP 10/18/2012   SpO2 97%   BMI 31.79 kg/m  Wt Readings from Last 3 Encounters:  08/11/16 168 lb 4 oz (76.3 kg)  07/28/16 167 lb (75.8 kg)  05/19/16 163 lb 12.8 oz (74.3 kg)     Lab Results  Component Value Date   WBC 9.3 05/23/2016   HGB 13.7 05/23/2016   HCT 40.4 05/23/2016   PLT 223.0 05/23/2016   GLUCOSE 92 05/23/2016   CHOL 132 05/23/2016   TRIG 76.0 05/23/2016   HDL 46.40 05/23/2016   LDLCALC 71 05/23/2016   ALT 15 05/23/2016   AST 21 05/23/2016   NA 142 05/23/2016   K 4.7 05/23/2016   CL 107 05/23/2016   CREATININE 0.91 05/23/2016   BUN 26 (H) 05/23/2016   CO2 30 05/23/2016   TSH 0.55 05/23/2016   INR 0.98 11/26/2015    Mm Screening Breast Tomo Bilateral  Result Date: 06/13/2016 CLINICAL DATA:  Screening. EXAM: 2D DIGITAL SCREENING BILATERAL MAMMOGRAM WITH CAD AND ADJUNCT TOMO COMPARISON:  Previous exam(s). ACR Breast Density Category a: The breast tissue is almost entirely fatty. FINDINGS: There are no findings suspicious for malignancy. Images were processed with CAD. IMPRESSION: No mammographic evidence of malignancy. A result letter of this screening mammogram will be mailed directly to the patient. RECOMMENDATION: Screening mammogram in one year. (Code:SM-B-01Y) BI-RADS CATEGORY  1: Negative. Electronically Signed   By: Ammie Ferrier M.D.   On: 06/13/2016  08:29       Assessment & Greer:   Problem List Items Addressed This Visit    Sinusitis    Symptoms and exam as outlined.  Treat with saline nasal spray and nasacort nasal spray as directed.  mucinex and robitussin as directed.  If persistent, then start doxycycline as discussed.  Has taken previously and tolerated.  Rest.  Fluids.  If takes abx, will take probiotic as outlined.        Relevant Medications   doxycycline (VIBRA-TABS) 100 MG tablet  Einar Pheasant, MD

## 2016-08-11 NOTE — Telephone Encounter (Signed)
Please call pt and see if she can come in for work in appt - today at 12:15.  See her my chart message.

## 2016-08-11 NOTE — Patient Instructions (Signed)
Saline nasal spray - flush nose at least 2-3x/day  Nasacort nasal spray - 2 sprays each nostril one time per day.  Do this in the evening.    Robitussin twice a day as needed.    Take a probiotic daily while you are on the antibiotic and for two weeks after completing the antibiotic.

## 2016-08-13 ENCOUNTER — Encounter: Payer: Self-pay | Admitting: Internal Medicine

## 2016-08-13 DIAGNOSIS — J329 Chronic sinusitis, unspecified: Secondary | ICD-10-CM | POA: Insufficient documentation

## 2016-08-13 NOTE — Assessment & Plan Note (Signed)
Symptoms and exam as outlined.  Treat with saline nasal spray and nasacort nasal spray as directed.  mucinex and robitussin as directed.  If persistent, then start doxycycline as discussed.  Has taken previously and tolerated.  Rest.  Fluids.  If takes abx, will take probiotic as outlined.

## 2016-08-15 ENCOUNTER — Other Ambulatory Visit (HOSPITAL_COMMUNITY): Payer: Self-pay | Admitting: Internal Medicine

## 2016-10-02 ENCOUNTER — Encounter: Payer: Self-pay | Admitting: *Deleted

## 2016-10-03 ENCOUNTER — Other Ambulatory Visit: Payer: Self-pay | Admitting: Gastroenterology

## 2016-10-03 ENCOUNTER — Ambulatory Visit: Payer: Managed Care, Other (non HMO) | Admitting: Anesthesiology

## 2016-10-03 ENCOUNTER — Ambulatory Visit
Admission: RE | Admit: 2016-10-03 | Discharge: 2016-10-03 | Disposition: A | Discharge status: Home / Self Care | Payer: Managed Care, Other (non HMO) | Source: Ambulatory Visit | Attending: Gastroenterology | Admitting: Gastroenterology

## 2016-10-03 ENCOUNTER — Encounter
Admission: RE | Disposition: A | Discharge status: Home / Self Care | Payer: Self-pay | Source: Ambulatory Visit | Attending: Gastroenterology

## 2016-10-03 DIAGNOSIS — R1011 Right upper quadrant pain: Secondary | ICD-10-CM

## 2016-10-03 DIAGNOSIS — I509 Heart failure, unspecified: Secondary | ICD-10-CM | POA: Insufficient documentation

## 2016-10-03 DIAGNOSIS — K2960 Other gastritis without bleeding: Secondary | ICD-10-CM | POA: Diagnosis not present

## 2016-10-03 DIAGNOSIS — Z7982 Long term (current) use of aspirin: Secondary | ICD-10-CM | POA: Diagnosis not present

## 2016-10-03 DIAGNOSIS — I429 Cardiomyopathy, unspecified: Secondary | ICD-10-CM | POA: Insufficient documentation

## 2016-10-03 DIAGNOSIS — I110 Hypertensive heart disease with heart failure: Secondary | ICD-10-CM | POA: Insufficient documentation

## 2016-10-03 DIAGNOSIS — K259 Gastric ulcer, unspecified as acute or chronic, without hemorrhage or perforation: Secondary | ICD-10-CM | POA: Insufficient documentation

## 2016-10-03 DIAGNOSIS — E669 Obesity, unspecified: Secondary | ICD-10-CM | POA: Diagnosis not present

## 2016-10-03 DIAGNOSIS — K219 Gastro-esophageal reflux disease without esophagitis: Secondary | ICD-10-CM | POA: Diagnosis not present

## 2016-10-03 DIAGNOSIS — K317 Polyp of stomach and duodenum: Secondary | ICD-10-CM | POA: Insufficient documentation

## 2016-10-03 DIAGNOSIS — E785 Hyperlipidemia, unspecified: Secondary | ICD-10-CM | POA: Diagnosis not present

## 2016-10-03 DIAGNOSIS — Z6831 Body mass index (BMI) 31.0-31.9, adult: Secondary | ICD-10-CM | POA: Diagnosis not present

## 2016-10-03 DIAGNOSIS — K224 Dyskinesia of esophagus: Secondary | ICD-10-CM | POA: Insufficient documentation

## 2016-10-03 HISTORY — DX: Uterovaginal prolapse, unspecified: N81.4

## 2016-10-03 HISTORY — PX: ESOPHAGOGASTRODUODENOSCOPY (EGD) WITH PROPOFOL: SHX5813

## 2016-10-03 HISTORY — DX: Stress incontinence (female) (male): N39.3

## 2016-10-03 HISTORY — DX: Other cardiomyopathies: I42.8

## 2016-10-03 HISTORY — DX: Cardiac arrhythmia, unspecified: I49.9

## 2016-10-03 HISTORY — DX: Cystocele, unspecified: N81.10

## 2016-10-03 HISTORY — DX: Postmenopausal atrophic vaginitis: N95.2

## 2016-10-03 HISTORY — DX: Dyspnea, unspecified: R06.00

## 2016-10-03 SURGERY — ESOPHAGOGASTRODUODENOSCOPY (EGD) WITH PROPOFOL
Anesthesia: General

## 2016-10-03 MED ORDER — PROPOFOL 500 MG/50ML IV EMUL
INTRAVENOUS | Status: DC | PRN
  Administered 2016-10-03: 160 ug/kg/min via INTRAVENOUS

## 2016-10-03 MED ORDER — LIDOCAINE 2% (20 MG/ML) 5 ML SYRINGE
INTRAMUSCULAR | Status: DC | PRN
  Administered 2016-10-03: 40 mg via INTRAVENOUS

## 2016-10-03 MED ORDER — FENTANYL CITRATE (PF) 100 MCG/2ML IJ SOLN
INTRAMUSCULAR | Status: AC
  Filled 2016-10-03: qty 2

## 2016-10-03 MED ORDER — SODIUM CHLORIDE 0.9 % IV SOLN
INTRAVENOUS | Status: DC
  Administered 2016-10-03: 12:00:00 via INTRAVENOUS

## 2016-10-03 MED ORDER — SODIUM CHLORIDE 0.9 % IV SOLN: INTRAVENOUS | Status: DC

## 2016-10-03 MED ORDER — MIDAZOLAM HCL 5 MG/5ML IJ SOLN
INTRAMUSCULAR | Status: DC | PRN
  Administered 2016-10-03: 1 mg via INTRAVENOUS

## 2016-10-03 MED ORDER — PROPOFOL 10 MG/ML IV BOLUS
INTRAVENOUS | Status: DC | PRN
  Administered 2016-10-03: 100 mg via INTRAVENOUS

## 2016-10-03 MED ORDER — FENTANYL CITRATE (PF) 100 MCG/2ML IJ SOLN
INTRAMUSCULAR | Status: DC | PRN
  Administered 2016-10-03: 50 ug via INTRAVENOUS

## 2016-10-03 MED ORDER — MIDAZOLAM HCL 2 MG/2ML IJ SOLN
INTRAMUSCULAR | Status: AC
  Filled 2016-10-03: qty 2

## 2016-10-03 MED ORDER — PHENYLEPHRINE HCL 10 MG/ML IJ SOLN
INTRAMUSCULAR | Status: DC | PRN
  Administered 2016-10-03: 100 ug via INTRAVENOUS

## 2016-10-03 MED ORDER — PROPOFOL 500 MG/50ML IV EMUL
INTRAVENOUS | Status: AC
  Filled 2016-10-03: qty 50

## 2016-10-03 NOTE — H&P (Signed)
Outpatient short stay form Pre-procedure 10/03/2016 11:38 AM Lollie Sails MD  Primary Physician: Dr. Einar Pheasant  Reason for visit:  EGD  History of present illness:  Patient is a 66 year old female presenting today as above. Patient has a history of a sharp short pain occurring in the epigastrium to right upper quadrant region. This is very intermittent in nature and when it resolves it is completely gone. It does not last but perhaps a few seconds. She does have a history of a cholecystectomy being done in 2006. She has not had a recent ultrasound. She does take Protonix 40 mg daily. She has held her 81 mg aspirin for several days. She takes no other aspirin products or blood thinning agents.    Current Facility-Administered Medications:  .  0.9 %  sodium chloride infusion, , Intravenous, Continuous, Lollie Sails, MD .  0.9 %  sodium chloride infusion, , Intravenous, Continuous, Lollie Sails, MD  Prescriptions Prior to Admission  Medication Sig Dispense Refill Last Dose  . aspirin 81 MG tablet Take 81 mg by mouth daily.   Past Week at Unknown time  . calcium carbonate (OSCAL) 1500 (600 Ca) MG TABS tablet Take by mouth 2 (two) times daily with a meal.   10/02/2016 at Unknown time  . carvedilol (COREG) 12.5 MG tablet TAKE 1 TABLET (12.5 MG TOTAL) BY MOUTH 2 (TWO) TIMES DAILY WITH A MEAL. 60 tablet 3 10/03/2016 at Unknown time  . Cholecalciferol 1000 units capsule Take 1,000 Units by mouth daily.   10/02/2016 at Unknown time  . diphenhydramine-acetaminophen (TYLENOL PM) 25-500 MG TABS tablet Take 1 tablet by mouth at bedtime as needed.   10/02/2016 at Unknown time  . doxycycline (VIBRA-TABS) 100 MG tablet Take 1 tablet (100 mg total) by mouth 2 (two) times daily. 20 tablet 0 10/02/2016 at Unknown time  . fexofenadine (ALLEGRA) 180 MG tablet Take 180 mg by mouth at bedtime.    10/02/2016 at Unknown time  . furosemide (LASIX) 20 MG tablet Take 1 tablet (20 mg total) by mouth every other  day. 90 tablet 1 Past Week at Unknown time  . lisinopril (PRINIVIL,ZESTRIL) 5 MG tablet Take 5 mg by mouth at bedtime. Reported on 11/11/2015   10/02/2016 at Unknown time  . pantoprazole (PROTONIX) 40 MG tablet Take 1 tablet (40 mg total) by mouth 2 (two) times daily before a meal. 60 tablet 1 10/02/2016 at Unknown time  . simvastatin (ZOCOR) 10 MG tablet TAKE 1 TABLET (10 MG TOTAL) BY MOUTH DAILY AT 6 PM. 30 tablet 11 10/02/2016 at Unknown time  . spironolactone (ALDACTONE) 25 MG tablet Take 0.5 tablets (12.5 mg total) by mouth daily. 45 tablet 3 10/03/2016 at Unknown time  . SUMAtriptan (IMITREX) 100 MG tablet Take 1 tablet (100 mg total) by mouth every 2 (two) hours as needed for migraine. May repeat in 2 hours if headache persists or recurs. 10 tablet 0 Past Week at Unknown time  . zonisamide (ZONEGRAN) 100 MG capsule TAKE 1 CAPSULE (100 MG TOTAL) BY MOUTH ONCE DAILY. PATIENT NEEDS APPOINTMENT 90 capsule 0 10/02/2016 at Unknown time  . OVER THE COUNTER MEDICATION Take 1 tablet by mouth daily.   Taking  . OVER THE COUNTER MEDICATION Take 1 packet by mouth 2 (two) times daily.   Taking     No Known Allergies   Past Medical History:  Diagnosis Date  . Allergy   . Cancer ([place])    basal cell  . Cardiomyopathy, nonischemic (  [place])   . Congestive heart failure ([place])   . Dyspnea   . Dysrhythmia    SVT  . Female cystocele   . Hyperlipidemia   . Hypertension   . Migraines   . Personal history of colonic polyps   . SUI (stress urinary incontinence, female)   . Uterovaginal prolapse   . Vaginal atrophy     Review of systems:      Physical Exam    Heart and lungs: Regular rate and rhythm without rub or gallop, lungs are bilaterally clear.    HEENT: Normocephalic atraumatic eyes are anicteric    Other:     Pertinant exam for procedure: Soft nontender nondistended bowel sounds positive normoactive.    Planned proceedures: EGD and indicated procedures. I have discussed the risks benefits and  complications of procedures to include not limited to bleeding, infection, perforation and the risk of sedation and the patient wishes to proceed.    Lollie Sails, MD Gastroenterology 10/03/2016  11:38 AM

## 2016-10-03 NOTE — Op Note (Signed)
[facility] Gastroenterology Patient Name: Kristi Greer Procedure Date: 10/03/2016 11:40 AM MRN: [id number] Account #: [id number] Date of Birth: [birth year] Admit Type: Outpatient Age: 66 Room: [facility] ENDO ROOM 3 Gender: Female Note Status: Finalized Procedure:            Upper GI endoscopy Indications:          Epigastric abdominal pain, Abdominal pain in the right                        upper quadrant, Gastro-esophageal reflux disease Providers:            Lollie Sails, MD Referring MD:         Einar Pheasant, MD (Referring MD) Medicines:            Monitored Anesthesia Care Complications:        No immediate complications. Procedure:            Pre-Anesthesia Assessment:                       - ASA Grade Assessment: II - A patient with mild                        systemic disease.                       After obtaining informed consent, the endoscope was                        passed under direct vision. Throughout the procedure,                        the patient's blood pressure, pulse, and oxygen                        saturations were monitored continuously. The Endoscope                        was introduced through the mouth, and advanced to the                        third part of duodenum. The upper GI endoscopy was                        accomplished without difficulty. The patient tolerated                        the procedure well. Findings:      The Z-line was variable. Biopsies were taken with a cold forceps for       histology.      The exam of the esophagus was otherwise normal.      Patchy mild inflammation characterized by erosions and erythema was       found in the gastric body and in the prepyloric region of the stomach.       Biopsies were taken with a cold forceps for histology. Biopsies were       taken with a cold forceps for Helicobacter pylori testing.      The cardia and gastric fundus were normal on retroflexion.      The  examined duodenum was normal.      Multiple 2 to  6 mm sessile plaque-like polyps with no bleeding and no       stigmata of recent bleeding were found in the gastric fundus. Biopsies       were taken with a cold forceps for histology.      Abnormal motility was noted in the lower third of the esophagus. The       cricopharyngeus was normal. There is spasticity of the esophageal body.       The distal esophagus/lower esophageal sphincter is spastic, but gives up       passage to the endoscope. Secondary peristaltic waves are noted. Impression:           - Z-line variable. Biopsied.                       - Erosive gastritis. Biopsied.                       - Normal examined duodenum.                       - Multiple gastric polyps. Biopsied. Recommendation:       - Discharge patient to home.                       - Continue present medications.                       - Await pathology results. Procedure Code(s):    --- Professional ---                       [phone], Esophagogastroduodenoscopy, flexible, transoral;                        with biopsy, single or multiple Diagnosis Code(s):    --- Professional ---                       K22.8, Other specified diseases of esophagus                       K29.60, Other gastritis without bleeding                       K31.7, Polyp of stomach and duodenum                       R10.13, Epigastric pain                       R10.11, Right upper quadrant pain                       K21.9, Gastro-esophageal reflux disease without                        esophagitis CPT copyright 2016 [employer]. All rights reserved. The codes documented in this report are preliminary and upon coder review may  be revised to meet current compliance requirements. Lollie Sails, MD 10/03/2016 12:28:56 PM This report has been signed electronically. Number of Addenda: 0 Note Initiated On: 10/03/2016 11:40 AM      [facility]

## 2016-10-03 NOTE — Anesthesia Post-op Follow-up Note (Cosign Needed)
Anesthesia QCDR form completed.        

## 2016-10-03 NOTE — Transfer of Care (Signed)
Immediate Anesthesia Transfer of Care Note  Patient: Kristi Greer  Procedure(s) Performed: Procedure(s): ESOPHAGOGASTRODUODENOSCOPY (EGD) WITH PROPOFOL (N/A)  Patient Location: PACU and Endoscopy Unit  Anesthesia Type:General  Level of Consciousness: sedated  Airway & Oxygen Therapy: Patient Spontanous Breathing and Patient connected to nasal cannula oxygen  Post-op Assessment: Report given to RN and Post -op Vital signs reviewed and stable  Post vital signs: Reviewed and stable  Last Vitals:  Vitals:   10/03/16 1128  BP: 120/71  Pulse: 72  Resp: 20  Temp: (!) 35.8 C    Last Pain:  Vitals:   10/03/16 1128  TempSrc: Tympanic         Complications: No apparent anesthesia complications

## 2016-10-03 NOTE — Anesthesia Preprocedure Evaluation (Signed)
Anesthesia Evaluation    Airway Mallampati: II  TM Distance: >3 FB Neck ROM: Full    Dental no notable dental hx.    Pulmonary neg pulmonary ROS, neg sleep apnea, neg COPD,    breath sounds clear to auscultation- rhonchi (-) wheezing      Cardiovascular hypertension, +CHF (nonischemic cardiomyopathy)  (-) CAD, (-) Past MI and (-) Cardiac Stents + dysrhythmias Supra Ventricular Tachycardia  Rhythm:Regular Rate:Normal - Systolic murmurs and - Diastolic murmurs Echo [id number]: - Left ventricle: The cavity size was normal. There was mild   concentric hypertrophy. Systolic function was mildly to   moderately reduced. The estimated ejection fraction was in the   range of 40% to 45%. Diffuse hypokinesis. Doppler parameters are   consistent with abnormal left ventricular relaxation (grade 1   diastolic dysfunction).  L heart cath 11/26/15: 1. Normal coronary arteries 2. 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 Well compensated hemodynamics    Neuro/Psych  Headaches, negative psych ROS   GI/Hepatic negative GI ROS, Neg liver ROS,   Endo/Other  negative endocrine ROSneg diabetes  Renal/GU negative Renal ROS     Musculoskeletal   Abdominal (+) + obese,   Peds  Hematology   Anesthesia Other Findings   Reproductive/Obstetrics                             Anesthesia Physical Anesthesia Plan  ASA: III  Anesthesia Plan: General   Post-op Pain Management:    Induction: Intravenous  Airway Management Planned: Natural Airway  Additional Equipment:   Intra-op Plan:   Post-operative Plan:   Informed Consent: I have reviewed the patients History and Physical, chart, labs and discussed the procedure including the risks, benefits and alternatives for the proposed anesthesia with the patient or authorized representative who has indicated his/her understanding and acceptance.   Dental advisory  given  Plan Discussed with: CRNA and Anesthesiologist  Anesthesia Plan Comments:         Anesthesia Quick Evaluation

## 2016-10-04 ENCOUNTER — Encounter (HOSPITAL_COMMUNITY): Payer: Managed Care, Other (non HMO) | Admitting: Internal Medicine

## 2016-10-04 ENCOUNTER — Encounter: Payer: Self-pay | Admitting: Gastroenterology

## 2016-10-04 NOTE — Anesthesia Postprocedure Evaluation (Signed)
Anesthesia Post Note  Patient: Kristi Greer  Procedure(s) Performed: Procedure(s) (LRB): ESOPHAGOGASTRODUODENOSCOPY (EGD) WITH PROPOFOL (N/A)  Patient location during evaluation: Endoscopy Anesthesia Type: General Level of consciousness: awake and alert and oriented Pain management: pain level controlled Vital Signs Assessment: post-procedure vital signs reviewed and stable Respiratory status: spontaneous breathing, nonlabored ventilation and respiratory function stable Cardiovascular status: blood pressure returned to baseline and stable Postop Assessment: no signs of nausea or vomiting Anesthetic complications: no     Last Vitals:  Vitals:   10/03/16 1250 10/03/16 1300  BP: (!) 108/58 111/65  Pulse: (!) 59 (!) 59  Resp: 12 14  Temp:      Last Pain:  Vitals:   10/03/16 1219  TempSrc: Tympanic                 Smt Lokey

## 2016-10-06 ENCOUNTER — Ambulatory Visit
Admission: RE | Admit: 2016-10-06 | Discharge: 2016-10-06 | Disposition: A | Discharge status: Home / Self Care | Payer: Managed Care, Other (non HMO) | Source: Ambulatory Visit | Attending: Gastroenterology | Admitting: Gastroenterology

## 2016-10-06 DIAGNOSIS — Z9049 Acquired absence of other specified parts of digestive tract: Secondary | ICD-10-CM | POA: Insufficient documentation

## 2016-10-06 DIAGNOSIS — R1011 Right upper quadrant pain: Secondary | ICD-10-CM

## 2016-10-06 LAB — SURGICAL PATHOLOGY

## 2016-10-09 ENCOUNTER — Encounter: Payer: Self-pay | Admitting: Internal Medicine

## 2016-10-09 NOTE — Telephone Encounter (Signed)
Please let her know that I am out of the office today and she will need to be evaluated to be treated.  Needs to go ahead and be seen today.  Thanks

## 2016-10-09 NOTE — Telephone Encounter (Signed)
Patient advised to go to urgent care and get evaluated today due to symptoms have been going on for 3-4 days.  Patient verbalized understanding.

## 2016-10-16 ENCOUNTER — Other Ambulatory Visit: Payer: Self-pay | Admitting: Internal Medicine

## 2016-10-16 NOTE — Telephone Encounter (Signed)
Ok to refill zonisamide until appt on 10/31/16?

## 2016-10-26 ENCOUNTER — Other Ambulatory Visit (INDEPENDENT_AMBULATORY_CARE_PROVIDER_SITE_OTHER): Payer: Managed Care, Other (non HMO)

## 2016-10-26 DIAGNOSIS — E7800 Pure hypercholesterolemia, unspecified: Secondary | ICD-10-CM | POA: Diagnosis not present

## 2016-10-26 DIAGNOSIS — I420 Dilated cardiomyopathy: Secondary | ICD-10-CM | POA: Diagnosis not present

## 2016-10-26 LAB — HEPATIC FUNCTION PANEL
ALT: 15 U/L (ref 0–35)
AST: 17 U/L (ref 0–37)
Albumin: 4.1 g/dL (ref 3.5–5.2)
Alkaline Phosphatase: 95 U/L (ref 39–117)
BILIRUBIN DIRECT: 0.1 mg/dL (ref 0.0–0.3)
BILIRUBIN TOTAL: 0.4 mg/dL (ref 0.2–1.2)
TOTAL PROTEIN: 6.6 g/dL (ref 6.0–8.3)

## 2016-10-26 LAB — BASIC METABOLIC PANEL
BUN: 27 mg/dL — AB (ref 6–23)
CHLORIDE: 104 meq/L (ref 96–112)
CO2: 30 mEq/L (ref 19–32)
CREATININE: 1 mg/dL (ref 0.40–1.20)
Calcium: 9.5 mg/dL (ref 8.4–10.5)
GFR: 59.35 mL/min — ABNORMAL LOW (ref >60.00)
GLUCOSE: 104 mg/dL — AB (ref 70–99)
POTASSIUM: 4.7 meq/L (ref 3.5–5.1)
Sodium: 139 mEq/L (ref 135–145)

## 2016-10-26 LAB — LIPID PANEL
CHOL/HDL RATIO: 3
CHOLESTEROL: 162 mg/dL (ref 0–200)
HDL: 49.6 mg/dL (ref >39.00)
LDL Cholesterol: 90 mg/dL (ref 0–99)
NonHDL: 111.91
TRIGLYCERIDES: 110 mg/dL (ref 0.0–149.0)
VLDL: 22 mg/dL (ref 0.0–40.0)

## 2016-10-27 ENCOUNTER — Encounter: Payer: Self-pay | Admitting: Internal Medicine

## 2016-10-31 ENCOUNTER — Encounter: Payer: Self-pay | Admitting: Internal Medicine

## 2016-10-31 ENCOUNTER — Ambulatory Visit (INDEPENDENT_AMBULATORY_CARE_PROVIDER_SITE_OTHER): Payer: Managed Care, Other (non HMO) | Admitting: Internal Medicine

## 2016-10-31 DIAGNOSIS — E7800 Pure hypercholesterolemia, unspecified: Secondary | ICD-10-CM | POA: Diagnosis not present

## 2016-10-31 DIAGNOSIS — I420 Dilated cardiomyopathy: Secondary | ICD-10-CM | POA: Diagnosis not present

## 2016-10-31 DIAGNOSIS — I471 Supraventricular tachycardia: Secondary | ICD-10-CM | POA: Diagnosis not present

## 2016-10-31 DIAGNOSIS — R200 Anesthesia of skin: Secondary | ICD-10-CM | POA: Diagnosis not present

## 2016-10-31 MED ORDER — SIMVASTATIN 10 MG PO TABS: ORAL_TABLET | ORAL | 11 refills | Status: DC

## 2016-10-31 MED ORDER — SUMATRIPTAN SUCCINATE 100 MG PO TABS: 100.0000 mg | ORAL_TABLET | Freq: Every day | ORAL | 0 refills | Status: DC | PRN

## 2016-10-31 NOTE — Progress Notes (Addendum)
Patient ID: [name], female   DOB: [birth year], 66 y.o.   MRN: [id number]   Subjective:    [name], female    DOB: [birth year], 66 y.o.   MRN: [id number]  HPI  Patient here for a scheduled follow up.  She reports she is doing relatively well.  No chest pain.  Breathing stable.  Had EGD 10/06/16 - erosive gastritis with multiple gastric polyps.  Swallowing ok.  On protonix.  Acid controlled.  No abdominal pain.  Bowels moving.  No urine change.  She does report having problems with bilateral hand numbness.  Shakes her hands and symptoms improve.  Worsening.  Worse through the night/in am.     Past Medical History:  Diagnosis Date  . Allergy   . Cancer ([place])    basal cell  . Cardiomyopathy, nonischemic ([place])   . Congestive heart failure ([place])   . Dyspnea   . Dysrhythmia    SVT  . Female cystocele   . Hyperlipidemia   . Hypertension   . Migraines   . Personal history of colonic polyps   . SUI (stress urinary incontinence, female)   . Uterovaginal prolapse   . Vaginal atrophy    Past Surgical History:  Procedure Laterality Date  . CARDIAC CATHETERIZATION N/A 11/26/2015   Procedure: Right/Left Heart Cath and Coronary Angiography;  Surgeon: Jolaine Artist, MD;  Location: [facility];  Service: Cardiovascular;  Laterality: N/A;  . CHOLECYSTECTOMY  2006  . ESOPHAGOGASTRODUODENOSCOPY (EGD) WITH PROPOFOL N/A 10/03/2016   Procedure: ESOPHAGOGASTRODUODENOSCOPY (EGD) WITH PROPOFOL;  Surgeon: Lollie Sails, MD;  Location: [facility] ENDOSCOPY;  Service: Endoscopy;  Laterality: N/A;  . FINGER SURGERY    . HYSTEROSCOPY  2011, 10/2010  . TUBAL LIGATION    . VAGINAL HYSTERECTOMY     midurethral sling   Family History  Problem Relation Age of Onset  . Hypertension Mother   . Stroke Mother   . Diabetes Mother   . Hypercholesterolemia Father   . Heart disease Father     MI  . Hypertension Father   . Stroke Father   . Breast cancer Neg Hx   . Colon  cancer Neg Hx    Social History   Social History  . Marital status: Married    Spouse name: N/A  . Number of children: 2  . Years of education: N/A   Social History Main Topics  . Smoking status: Never Smoker  . Smokeless tobacco: Never Used  . Alcohol use 0.0 oz/week     Comment: Socially  . Drug use: No  . Sexual activity: Not Asked   Other Topics Concern  . None   Social History Narrative  . None    Outpatient Encounter Prescriptions as of 10/31/2016  Medication Sig  . aspirin 81 MG tablet Take 81 mg by mouth daily.  . calcium carbonate (OSCAL) 1500 (600 Ca) MG TABS tablet Take by mouth 2 (two) times daily with a meal.  . carvedilol (COREG) 12.5 MG tablet TAKE 1 TABLET (12.5 MG TOTAL) BY MOUTH 2 (TWO) TIMES DAILY WITH A MEAL.  Marland Kitchen Cholecalciferol 1000 units capsule Take 1,000 Units by mouth daily.  . diphenhydramine-acetaminophen (TYLENOL PM) 25-500 MG TABS tablet Take 1 tablet by mouth at bedtime as needed.  . doxycycline (VIBRA-TABS) 100 MG tablet Take 1 tablet (100 mg total) by mouth 2 (two) times daily.  . fexofenadine (ALLEGRA) 180 MG tablet Take 180 mg by mouth at bedtime.   Marland Kitchen  furosemide (LASIX) 20 MG tablet Take 1 tablet (20 mg total) by mouth every other day.  . lisinopril (PRINIVIL,ZESTRIL) 5 MG tablet Take 5 mg by mouth at bedtime. Reported on 11/11/2015  . OVER THE COUNTER MEDICATION Take 1 tablet by mouth daily.  Marland Kitchen OVER THE COUNTER MEDICATION Take 1 packet by mouth 2 (two) times daily.  . pantoprazole (PROTONIX) 40 MG tablet Take 1 tablet (40 mg total) by mouth 2 (two) times daily before a meal.  . simvastatin (ZOCOR) 10 MG tablet TAKE 1 TABLET (10 MG TOTAL) BY MOUTH DAILY AT 6 PM.  . SUMAtriptan (IMITREX) 100 MG tablet Take 1 tablet (100 mg total) by mouth daily as needed for migraine. May repeat in 2 hours if headache persists or recurs.  Marland Kitchen zonisamide (ZONEGRAN) 100 MG capsule TAKE 1 CAPSULE (100 MG TOTAL) BY MOUTH ONCE DAILY. PATIENT NEEDS APPOINTMENT  .  [DISCONTINUED] simvastatin (ZOCOR) 10 MG tablet TAKE 1 TABLET (10 MG TOTAL) BY MOUTH DAILY AT 6 PM.  . [DISCONTINUED] spironolactone (ALDACTONE) 25 MG tablet Take 0.5 tablets (12.5 mg total) by mouth daily.  . [DISCONTINUED] SUMAtriptan (IMITREX) 100 MG tablet Take 1 tablet (100 mg total) by mouth every 2 (two) hours as needed for migraine. May repeat in 2 hours if headache persists or recurs.   No facility-administered encounter medications on file as of 10/31/2016.     Review of Systems  Constitutional: Negative for appetite change and unexpected weight change.  HENT: Negative for congestion and sinus pressure.   Respiratory: Negative for cough, chest tightness and shortness of breath.   Cardiovascular: Negative for chest pain, palpitations and leg swelling.  Gastrointestinal: Negative for abdominal pain, diarrhea, nausea and vomiting.  Genitourinary: Negative for difficulty urinating and dysuria.  Musculoskeletal: Negative for back pain and joint swelling.  Skin: Negative for color change and rash.  Neurological: Negative for dizziness, light-headedness and headaches.  Psychiatric/Behavioral: Negative for agitation and dysphoric mood.       Objective:     Blood pressure rechecked by me:  118/78  Physical Exam  Constitutional: She appears well-developed and well-nourished. No distress.  HENT:  Nose: Nose normal.  Mouth/Throat: Oropharynx is clear and moist.  Neck: Neck supple. No thyromegaly present.  Cardiovascular: Normal rate and regular rhythm.   Pulmonary/Chest: Breath sounds normal. No respiratory distress. She has no wheezes.  Abdominal: Soft. Bowel sounds are normal. There is no tenderness.  Musculoskeletal: She exhibits no edema or tenderness.  Lymphadenopathy:    She has no cervical adenopathy.  Skin: No rash noted. No erythema.  Psychiatric: She has a normal mood and affect. Her behavior is normal.    BP (!) 100/58 (BP Location: Left Arm, Patient Position: Sitting,  Cuff Size: Large)   Pulse 76   Temp 98.6 F (37 C) (Oral)   Resp 16   Ht 5\' 1"  (1.549 m)   Wt 170 lb 3.2 oz (77.2 kg)   LMP 10/18/2012   SpO2 98%   BMI 32.16 kg/m  Wt Readings from Last 3 Encounters:  10/31/16 170 lb 3.2 oz (77.2 kg)  10/03/16 165 lb (74.8 kg)  08/11/16 168 lb 4 oz (76.3 kg)     Lab Results  Component Value Date   WBC 9.3 05/23/2016   HGB 13.7 05/23/2016   HCT 40.4 05/23/2016   PLT 223.0 05/23/2016   GLUCOSE 104 (H) 10/26/2016   CHOL 162 10/26/2016   TRIG 110.0 10/26/2016   HDL 49.60 10/26/2016   LDLCALC 90 10/26/2016  ALT 15 10/26/2016   AST 17 10/26/2016   NA 139 10/26/2016   K 4.7 10/26/2016   CL 104 10/26/2016   CREATININE 1.00 10/26/2016   BUN 27 (H) 10/26/2016   CO2 30 10/26/2016   TSH 0.55 05/23/2016   INR 0.98 11/26/2015    US Abdomen Limited Ruq  Result Date: 10/06/2016 CLINICAL DATA:  Right upper quadrant pain. EXAM: US ABDOMEN LIMITED - RIGHT UPPER QUADRANT COMPARISON:  None. FINDINGS: Gallbladder: Cholecystectomy. Common bile duct: Diameter: 4 mm, normal. Liver: No focal lesion identified. Within normal limits in parenchymal echogenicity. IMPRESSION: Normal exam.  The gallbladder has been removed. Electronically Signed   By: Lorriane Shire M.D.   On: 10/06/2016 10:33       Assessment & Plan:   Problem List Items Addressed This Visit    Bilateral hand numbness    Appears to be c/w CTS.  Since worsening and persistent, schedule for NCS of upper extremities.        Relevant Orders   Ambulatory referral to Neurology   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[place]) (Chronic)    Doing better.  Followed by cardiology.  Continue current medication regimen.        Relevant Medications   simvastatin (ZOCOR) 10 MG tablet   Hypercholesterolemia    Low cholesterol diet and exercise.  Follow lipid panel.  On simvastatin.        Relevant Medications   simvastatin (ZOCOR) 10 MG tablet   Other Relevant Orders   Hepatic function panel   Lipid  panel   Basic metabolic panel   SVT (supraventricular tachycardia) (HCC) (Chronic)    On carvedilol.  Stable.  Followed by cardiology.       Relevant Medications   simvastatin (ZOCOR) 10 MG tablet       Einar Pheasant, MD

## 2016-10-31 NOTE — Progress Notes (Signed)
Pre-visit discussion using our clinic review tool. No additional management support is needed unless otherwise documented below in the visit note.  

## 2016-10-31 NOTE — Telephone Encounter (Signed)
Copy of message mailed to patient

## 2016-11-05 ENCOUNTER — Encounter: Payer: Self-pay | Admitting: Internal Medicine

## 2016-11-05 NOTE — Assessment & Plan Note (Signed)
On carvedilol.  Stable.  Followed by cardiology.  

## 2016-11-05 NOTE — Assessment & Plan Note (Signed)
Low cholesterol diet and exercise.  Follow lipid panel.  On simvastatin.

## 2016-11-05 NOTE — Assessment & Plan Note (Signed)
Doing better.  Followed by cardiology.  Continue current medication regimen.

## 2016-11-06 ENCOUNTER — Encounter (HOSPITAL_COMMUNITY): Payer: Managed Care, Other (non HMO) | Admitting: Internal Medicine

## 2016-11-06 ENCOUNTER — Other Ambulatory Visit (HOSPITAL_COMMUNITY): Payer: Self-pay | Admitting: Internal Medicine

## 2016-11-08 ENCOUNTER — Encounter: Payer: Self-pay | Admitting: Internal Medicine

## 2016-11-08 ENCOUNTER — Other Ambulatory Visit: Payer: Self-pay | Admitting: Internal Medicine

## 2016-11-08 DIAGNOSIS — G5600 Carpal tunnel syndrome, unspecified upper limb: Secondary | ICD-10-CM | POA: Insufficient documentation

## 2016-11-08 NOTE — Assessment & Plan Note (Signed)
Appears to be c/w CTS.  Since worsening and persistent, schedule for NCS of upper extremities.

## 2016-11-08 NOTE — Addendum Note (Signed)
Addended by: Alisa Graff on: 11/08/2016 01:45 PM   Modules accepted: Orders

## 2016-11-29 ENCOUNTER — Telehealth: Payer: Self-pay | Admitting: *Deleted

## 2016-11-29 ENCOUNTER — Ambulatory Visit (HOSPITAL_COMMUNITY)
Admission: RE | Admit: 2016-11-29 | Discharge: 2016-11-29 | Disposition: A | Discharge status: Home / Self Care | Payer: Managed Care, Other (non HMO) | Source: Ambulatory Visit | Attending: Internal Medicine | Admitting: Internal Medicine

## 2016-11-29 ENCOUNTER — Encounter (HOSPITAL_COMMUNITY): Payer: Self-pay | Admitting: Internal Medicine

## 2016-11-29 VITALS — BP 108/58 | HR 82 | Wt 169.0 lb

## 2016-11-29 DIAGNOSIS — N393 Stress incontinence (female) (male): Secondary | ICD-10-CM | POA: Diagnosis not present

## 2016-11-29 DIAGNOSIS — R5382 Chronic fatigue, unspecified: Secondary | ICD-10-CM

## 2016-11-29 DIAGNOSIS — Z833 Family history of diabetes mellitus: Secondary | ICD-10-CM | POA: Insufficient documentation

## 2016-11-29 DIAGNOSIS — G5600 Carpal tunnel syndrome, unspecified upper limb: Secondary | ICD-10-CM

## 2016-11-29 DIAGNOSIS — E785 Hyperlipidemia, unspecified: Secondary | ICD-10-CM | POA: Insufficient documentation

## 2016-11-29 DIAGNOSIS — N814 Uterovaginal prolapse, unspecified: Secondary | ICD-10-CM | POA: Insufficient documentation

## 2016-11-29 DIAGNOSIS — Z800 Family history of malignant neoplasm of digestive organs: Secondary | ICD-10-CM | POA: Insufficient documentation

## 2016-11-29 DIAGNOSIS — I429 Cardiomyopathy, unspecified: Secondary | ICD-10-CM | POA: Insufficient documentation

## 2016-11-29 DIAGNOSIS — R0683 Snoring: Secondary | ICD-10-CM

## 2016-11-29 DIAGNOSIS — Z6831 Body mass index (BMI) 31.0-31.9, adult: Secondary | ICD-10-CM | POA: Diagnosis not present

## 2016-11-29 DIAGNOSIS — I110 Hypertensive heart disease with heart failure: Secondary | ICD-10-CM | POA: Insufficient documentation

## 2016-11-29 DIAGNOSIS — Z7982 Long term (current) use of aspirin: Secondary | ICD-10-CM | POA: Diagnosis not present

## 2016-11-29 DIAGNOSIS — Z85828 Personal history of other malignant neoplasm of skin: Secondary | ICD-10-CM | POA: Diagnosis not present

## 2016-11-29 DIAGNOSIS — I471 Supraventricular tachycardia: Secondary | ICD-10-CM | POA: Diagnosis not present

## 2016-11-29 DIAGNOSIS — Z8249 Family history of ischemic heart disease and other diseases of the circulatory system: Secondary | ICD-10-CM | POA: Insufficient documentation

## 2016-11-29 DIAGNOSIS — G43909 Migraine, unspecified, not intractable, without status migrainosus: Secondary | ICD-10-CM | POA: Insufficient documentation

## 2016-11-29 DIAGNOSIS — Z803 Family history of malignant neoplasm of breast: Secondary | ICD-10-CM | POA: Insufficient documentation

## 2016-11-29 DIAGNOSIS — Z823 Family history of stroke: Secondary | ICD-10-CM | POA: Insufficient documentation

## 2016-11-29 DIAGNOSIS — I5022 Chronic systolic (congestive) heart failure: Secondary | ICD-10-CM | POA: Diagnosis present

## 2016-11-29 DIAGNOSIS — E669 Obesity, unspecified: Secondary | ICD-10-CM | POA: Diagnosis not present

## 2016-11-29 DIAGNOSIS — I472 Ventricular tachycardia: Secondary | ICD-10-CM | POA: Diagnosis not present

## 2016-11-29 DIAGNOSIS — Z86010 Personal history of colonic polyps: Secondary | ICD-10-CM | POA: Insufficient documentation

## 2016-11-29 DIAGNOSIS — G5603 Carpal tunnel syndrome, bilateral upper limbs: Secondary | ICD-10-CM | POA: Insufficient documentation

## 2016-11-29 DIAGNOSIS — R5383 Other fatigue: Secondary | ICD-10-CM | POA: Insufficient documentation

## 2016-11-29 NOTE — Patient Instructions (Signed)
Your physician has requested that you have an echocardiogram. Echocardiography is a painless test that uses sound waves to create images of your heart. It provides your doctor with information about the size and shape of your heart and how well your heart's chambers and valves are working. This procedure takes approximately one hour. There are no restrictions for this procedure.  Your physician has recommended that you have a sleep study. This test records several body functions during sleep, including: brain activity, eye movement, oxygen and carbon dioxide blood levels, heart rate and rhythm, breathing rate and rhythm, the flow of air through your mouth and nose, snoring, body muscle movements, and chest and belly movement.  We will contact you in 1 year to schedule your next appointment.  

## 2016-11-29 NOTE — Addendum Note (Signed)
Encounter addended by: Scarlette Calico, RN on: 11/29/2016  3:31 PM<BR>    Actions taken: Visit diagnoses modified, Order list changed, Diagnosis association updated, Sign clinical note

## 2016-11-29 NOTE — Telephone Encounter (Signed)
I have placed the order for the referral to ortho (Dr Rudene Christians).  Her message stated for carpal tunnel surgery.  He will have to evaluate and see what is needed.  Also, if surgery planned, would need cardiology clearance prior to surgery.

## 2016-11-29 NOTE — Progress Notes (Signed)
Patient ID: KURA BETHARDS, female   DOB: July 18, 1951, 66 y.o.   MRN: [id number]    Advanced Heart Failure Clinic Note   Referring Physician: Dr.Scott Primary Care: Einar Pheasant Primary Cardiologist: Paraschos HF: Dr. Haroldine Laws   HPI: Ms. Christoffersen is a 66 y/o woman with h/o obesity, migraines and HTN referred by Dr. Nicki Reaper for further evaluation of CHF.  Started feeling short of breath in October 2016. Thought she had severe bronchitis.   In January 2017 admitted at [facility] with acute CHF and SVT at 150. (No palpitations at the time) Received adenosine but didn't break. (slowed down and ECG looked like possible atrial tach). Eventually converted spontaneously. Echo EF 35-40%. TSH was low at 0.18 but FT4 and FT3 were fine. Felt to possible have hyperthyroidism. Seen by endocrine who started tapazole and a b-blocker in house. However, had f/u with Endo and they stopped the tapazole saying her thyroid wasn't the problem  Underwent cath 11/26/15 with NICM and well compensated hemodynamics. Monitor placed to look for recurrent SVT.   Today she presents for HF follow up. Overall feeling good. Denies SOB/PND/Orthopnea. No fever or chills. Weight at home 164 pounds. Rarely tachycardic. Able to walk a mile. Taking all medications. Wears FitBit 24/7. Has had rare episodes of HR spiking to 140-150 just for a few seconds on FitBit. These were totally asx. Resting HRs in 60s. Denies palpitations. No edema. SBP 100-110.    Cardiac Test CMRI 03/2016 LVEF 48% RV normal. No evidence of infiltrative disease.   On 10/11/15 had Myoview EF 30% No ischemia or scar.  Event monitor 4/17: NSR one 7 beat NSVT. No SVT  Echo 01/31/16 LVEF 40-45%, Grade 1 DD, Normal RV  R/L cath 11/26/15:  Ao = 104/61 (78) LV = 107/6/14 RA = 4 RV = 28/4/8 PA = 25/8 (16) PCW = 7 Fick cardiac output/index = 4.7/2.8 PVR = 2.0 WU SVR = 1265 FA sat = 99% PA sat = 71%, 74% Assessment: 1. Normal coronary arteries 2. NICM EF  30-35% suspect tachycardia-induced 3. Well compensated hemodynamics  SH: Never smoker. Rarely uses ETOH. No family H/o SCD. Multiple family members with CAD.    Past Medical History:  Diagnosis Date  . Allergy   . Cancer ([place])    basal cell  . Cardiomyopathy, nonischemic ([place])   . Congestive heart failure ([place])   . Dyspnea   . Dysrhythmia    SVT  . Female cystocele   . Hyperlipidemia   . Hypertension   . Migraines   . Personal history of colonic polyps   . SUI (stress urinary incontinence, female)   . Uterovaginal prolapse   . Vaginal atrophy     Current Outpatient Prescriptions  Medication Sig Dispense Refill  . aspirin 81 MG tablet Take 81 mg by mouth daily.    . calcium carbonate (OSCAL) 1500 (600 Ca) MG TABS tablet Take by mouth 2 (two) times daily with a meal.    . carvedilol (COREG) 12.5 MG tablet TAKE 1 TABLET (12.5 MG TOTAL) BY MOUTH 2 (TWO) TIMES DAILY WITH A MEAL. 60 tablet 3  . Cholecalciferol 1000 units capsule Take 1,000 Units by mouth daily.    . diphenhydramine-acetaminophen (TYLENOL PM) 25-500 MG TABS tablet Take 1 tablet by mouth at bedtime as needed.    . fexofenadine (ALLEGRA) 180 MG tablet Take 180 mg by mouth at bedtime.     . furosemide (LASIX) 20 MG tablet Take 1 tablet (20 mg total) by mouth every  other day. 90 tablet 1  . lisinopril (PRINIVIL,ZESTRIL) 5 MG tablet Take 5 mg by mouth at bedtime. Reported on 11/11/2015    . OVER THE COUNTER MEDICATION Take 1 tablet by mouth daily.    Marland Kitchen OVER THE COUNTER MEDICATION Take 1 packet by mouth 2 (two) times daily.    . pantoprazole (PROTONIX) 40 MG tablet Take 1 tablet (40 mg total) by mouth 2 (two) times daily before a meal. 60 tablet 1  . simvastatin (ZOCOR) 10 MG tablet TAKE 1 TABLET (10 MG TOTAL) BY MOUTH DAILY AT 6 PM. 30 tablet 11  . spironolactone (ALDACTONE) 25 MG tablet TAKE 0.5 TABLETS (12.5 MG TOTAL) BY MOUTH DAILY. 45 tablet 3  . SUMAtriptan (IMITREX) 100 MG tablet Take 1 tablet (100 mg total) by  mouth daily as needed for migraine. May repeat in 2 hours if headache persists or recurs. 10 tablet 0  . zonisamide (ZONEGRAN) 100 MG capsule TAKE 1 CAPSULE (100 MG TOTAL) BY MOUTH ONCE DAILY. PATIENT NEEDS APPOINTMENT 90 capsule 0   No current facility-administered medications for this encounter.     No Known Allergies    Social History   Social History  . Marital status: Married    Spouse name: N/A  . Number of children: 2  . Years of education: N/A   Occupational History  . Not on file.   Social History Main Topics  . Smoking status: Never Smoker  . Smokeless tobacco: Never Used  . Alcohol use 0.0 oz/week     Comment: Socially  . Drug use: No  . Sexual activity: Not on file   Other Topics Concern  . Not on file   Social History Narrative  . No narrative on file      Family History  Problem Relation Age of Onset  . Hypertension Mother   . Stroke Mother   . Diabetes Mother   . Hypercholesterolemia Father   . Heart disease Father     MI  . Hypertension Father   . Stroke Father   . Breast cancer Neg Hx   . Colon cancer Neg Hx     Vitals:   11/29/16 1433  BP: (!) 108/58  Pulse: 82  SpO2: 99%  Weight: 169 lb (76.7 kg)   Wt Readings from Last 3 Encounters:  11/29/16 169 lb (76.7 kg)  10/31/16 170 lb 3.2 oz (77.2 kg)  10/03/16 165 lb (74.8 kg)   PHYSICAL EXAM: General:  Well appearing. No resp difficulty HEENT: normal Neck: supple. no JVD. Carotids 2+ bilat; no bruits. No lymphadenopathy or thryomegaly appreciated. Cor: PMI nondisplaced. Regular rate & rhythm. No rubs, gallops or murmurs. Lungs: clear Abdomen: obese, soft, nontender, nondistended. No hepatosplenomegaly. No bruits or masses. Good bowel sounds. Extremities: no cyanosis, clubbing, rash, edema Neuro: alert & orientedx3, cranial nerves grossly intact. moves all 4 extremities w/o difficulty. Affect pleasant  ASSESSMENT & PLAN: 1. Chronic systolic HF - EF 95-09% echo 1/17. Cath 3/17 EF  30-35% normal cors - Echo 01/2016 EF 40-45%, Grade 1 DD. Suspect she has a viral vs tachy-induced CM.  - CMRI  03/2016. EF 48% - NYHA II. Volume status stable. Continue current HF meds.  - Continue lasix 20 mg every other day.   - Continue spironolactone 12.5 mg daily.  - Continue Coreg 12.5 mg BID.  - Continue lisinopril 5 mg daily. I reviewed renal function from March 2018. Stable  -2. SVT- rarely tachycardic. Continue bb.  3. NSVT- resolved.  4. Fatigue  -  Consider sleep study.   Follow up in 1 year  -  Darrick Grinder, NP-C  2:48 PM   Patient seen and examined with Darrick Grinder, NP. We discussed all aspects of the encounter. I agree with the assessment and plan as stated above.   Overall doing very well. Suspect she had viral CM but also had SVT. MRI in 8/17 showed near normal cardiac function. No doing well with no evidence of signifcant HF. Will get repeat echo to reassess LV function. Send for sleep study due to fatigue and snoring.   Glori Bickers, MD  3:21 PM

## 2016-11-29 NOTE — Telephone Encounter (Signed)
Patient requested to have a referral to Dr. Rudene Christians at  [facility], to have  her carpoal tunnel surgery.  Pt contact [phone]

## 2016-11-30 NOTE — Telephone Encounter (Signed)
Left patient message to let her know that referral has been put in for consult to call us if any questions.

## 2016-12-06 ENCOUNTER — Ambulatory Visit (HOSPITAL_COMMUNITY)
Admission: RE | Admit: 2016-12-06 | Discharge: 2016-12-06 | Disposition: A | Discharge status: Home / Self Care | Payer: Managed Care, Other (non HMO) | Source: Ambulatory Visit | Attending: Internal Medicine | Admitting: Internal Medicine

## 2016-12-06 DIAGNOSIS — E785 Hyperlipidemia, unspecified: Secondary | ICD-10-CM | POA: Diagnosis not present

## 2016-12-06 DIAGNOSIS — I5022 Chronic systolic (congestive) heart failure: Secondary | ICD-10-CM | POA: Insufficient documentation

## 2016-12-06 DIAGNOSIS — I110 Hypertensive heart disease with heart failure: Secondary | ICD-10-CM | POA: Insufficient documentation

## 2016-12-12 ENCOUNTER — Other Ambulatory Visit (HOSPITAL_COMMUNITY): Payer: Self-pay | Admitting: Internal Medicine

## 2017-01-17 ENCOUNTER — Other Ambulatory Visit: Payer: Self-pay | Admitting: Internal Medicine

## 2017-01-18 ENCOUNTER — Encounter (HOSPITAL_BASED_OUTPATIENT_CLINIC_OR_DEPARTMENT_OTHER): Payer: Managed Care, Other (non HMO)

## 2017-01-25 ENCOUNTER — Other Ambulatory Visit (HOSPITAL_COMMUNITY): Payer: Self-pay | Admitting: Internal Medicine

## 2017-01-26 ENCOUNTER — Telehealth (HOSPITAL_COMMUNITY): Payer: Self-pay | Admitting: Vascular Surgery

## 2017-01-26 NOTE — Telephone Encounter (Signed)
Pt called wanting to get sleep study done in [place] because [facility] sleep center is out of network, she states she spoke to someone and this test was supposed to scheduled in [place] there no order in system ..please advise

## 2017-01-29 ENCOUNTER — Other Ambulatory Visit (HOSPITAL_COMMUNITY): Payer: Self-pay | Admitting: Cardiology

## 2017-01-29 MED ORDER — LISINOPRIL 5 MG PO TABS: 5.0000 mg | ORAL_TABLET | Freq: Every day | ORAL | 3 refills | Status: DC

## 2017-01-30 NOTE — Telephone Encounter (Signed)
Form completed and signed for sleep study to be done through [facility], and faxed.  Left pt a detailed VM in her ID VM this was done

## 2017-03-16 ENCOUNTER — Encounter: Payer: Self-pay | Admitting: *Deleted

## 2017-03-19 ENCOUNTER — Ambulatory Visit: Payer: Managed Care, Other (non HMO) | Admitting: Anesthesiology

## 2017-03-19 ENCOUNTER — Ambulatory Visit
Admission: RE | Admit: 2017-03-19 | Discharge: 2017-03-19 | Disposition: A | Discharge status: Home / Self Care | Payer: Managed Care, Other (non HMO) | Source: Ambulatory Visit | Attending: Gastroenterology | Admitting: Gastroenterology

## 2017-03-19 ENCOUNTER — Other Ambulatory Visit: Payer: Managed Care, Other (non HMO)

## 2017-03-19 ENCOUNTER — Encounter
Admission: RE | Disposition: A | Discharge status: Home / Self Care | Payer: Self-pay | Source: Ambulatory Visit | Attending: Gastroenterology

## 2017-03-19 DIAGNOSIS — Z86010 Personal history of colonic polyps: Secondary | ICD-10-CM | POA: Insufficient documentation

## 2017-03-19 DIAGNOSIS — E785 Hyperlipidemia, unspecified: Secondary | ICD-10-CM | POA: Insufficient documentation

## 2017-03-19 DIAGNOSIS — I509 Heart failure, unspecified: Secondary | ICD-10-CM | POA: Diagnosis not present

## 2017-03-19 DIAGNOSIS — I471 Supraventricular tachycardia: Secondary | ICD-10-CM | POA: Insufficient documentation

## 2017-03-19 DIAGNOSIS — R51 Headache: Secondary | ICD-10-CM | POA: Insufficient documentation

## 2017-03-19 DIAGNOSIS — I110 Hypertensive heart disease with heart failure: Secondary | ICD-10-CM | POA: Diagnosis not present

## 2017-03-19 DIAGNOSIS — I429 Cardiomyopathy, unspecified: Secondary | ICD-10-CM | POA: Diagnosis not present

## 2017-03-19 DIAGNOSIS — Z7982 Long term (current) use of aspirin: Secondary | ICD-10-CM | POA: Diagnosis not present

## 2017-03-19 DIAGNOSIS — J449 Chronic obstructive pulmonary disease, unspecified: Secondary | ICD-10-CM | POA: Diagnosis not present

## 2017-03-19 DIAGNOSIS — Z955 Presence of coronary angioplasty implant and graft: Secondary | ICD-10-CM | POA: Diagnosis not present

## 2017-03-19 DIAGNOSIS — Z79899 Other long term (current) drug therapy: Secondary | ICD-10-CM | POA: Insufficient documentation

## 2017-03-19 DIAGNOSIS — Z1211 Encounter for screening for malignant neoplasm of colon: Secondary | ICD-10-CM | POA: Insufficient documentation

## 2017-03-19 DIAGNOSIS — N3281 Overactive bladder: Secondary | ICD-10-CM | POA: Insufficient documentation

## 2017-03-19 DIAGNOSIS — K641 Second degree hemorrhoids: Secondary | ICD-10-CM | POA: Diagnosis not present

## 2017-03-19 DIAGNOSIS — K6389 Other specified diseases of intestine: Secondary | ICD-10-CM | POA: Diagnosis not present

## 2017-03-19 DIAGNOSIS — K5730 Diverticulosis of large intestine without perforation or abscess without bleeding: Secondary | ICD-10-CM | POA: Diagnosis not present

## 2017-03-19 HISTORY — DX: Overactive bladder: N32.81

## 2017-03-19 HISTORY — DX: Nausea with vomiting, unspecified: R11.2

## 2017-03-19 HISTORY — DX: Other specified postprocedural states: Z98.890

## 2017-03-19 HISTORY — PX: COLONOSCOPY WITH PROPOFOL: SHX5780

## 2017-03-19 HISTORY — DX: Carpal tunnel syndrome, unspecified upper limb: G56.00

## 2017-03-19 LAB — HM COLONOSCOPY

## 2017-03-19 SURGERY — COLONOSCOPY WITH PROPOFOL
Anesthesia: General

## 2017-03-19 MED ORDER — FENTANYL CITRATE (PF) 100 MCG/2ML IJ SOLN
INTRAMUSCULAR | Status: DC | PRN
  Administered 2017-03-19: 25 ug via INTRAVENOUS

## 2017-03-19 MED ORDER — PROPOFOL 10 MG/ML IV BOLUS
INTRAVENOUS | Status: DC | PRN
Start: 2017-03-19 — End: 2017-03-19
  Administered 2017-03-19: 70 mg via INTRAVENOUS

## 2017-03-19 MED ORDER — MIDAZOLAM HCL 2 MG/2ML IJ SOLN
INTRAMUSCULAR | Status: DC | PRN
  Administered 2017-03-19: 1 mg via INTRAVENOUS

## 2017-03-19 MED ORDER — PROPOFOL 500 MG/50ML IV EMUL
INTRAVENOUS | Status: DC | PRN
  Administered 2017-03-19: 140 ug/kg/min via INTRAVENOUS

## 2017-03-19 MED ORDER — MIDAZOLAM HCL 2 MG/2ML IJ SOLN
INTRAMUSCULAR | Status: AC
  Filled 2017-03-19: qty 2

## 2017-03-19 MED ORDER — PHENYLEPHRINE HCL 10 MG/ML IJ SOLN
INTRAMUSCULAR | Status: DC | PRN
  Administered 2017-03-19: 150 ug via INTRAVENOUS
  Administered 2017-03-19: 50 ug via INTRAVENOUS
  Administered 2017-03-19 (×2): 100 ug via INTRAVENOUS

## 2017-03-19 MED ORDER — PROPOFOL 500 MG/50ML IV EMUL
INTRAVENOUS | Status: AC
Start: 2017-03-19 — End: 2017-03-19
  Filled 2017-03-19: qty 50

## 2017-03-19 MED ORDER — PIPERACILLIN-TAZOBACTAM 3.375 G IVPB
INTRAVENOUS | Status: AC
  Filled 2017-03-19: qty 50

## 2017-03-19 MED ORDER — SODIUM CHLORIDE 0.9 % IV SOLN
INTRAVENOUS | Status: DC
  Administered 2017-03-19: 11:00:00 via INTRAVENOUS

## 2017-03-19 MED ORDER — FENTANYL CITRATE (PF) 100 MCG/2ML IJ SOLN
INTRAMUSCULAR | Status: AC
  Filled 2017-03-19: qty 2

## 2017-03-19 NOTE — Anesthesia Postprocedure Evaluation (Signed)
Anesthesia Post Note  Patient: Kristi Greer  Procedure(s) Performed: Procedure(s) (LRB): COLONOSCOPY WITH PROPOFOL (N/A)  Patient location during evaluation: Endoscopy Anesthesia Type: General Level of consciousness: awake and alert and oriented Pain management: pain level controlled Vital Signs Assessment: post-procedure vital signs reviewed and stable Respiratory status: spontaneous breathing, nonlabored ventilation and respiratory function stable Cardiovascular status: blood pressure returned to baseline and stable Postop Assessment: no signs of nausea or vomiting Anesthetic complications: no     Last Vitals:  Vitals:   03/19/17 1304 03/19/17 1314  BP: (!) 92/59 (!) 91/53  Pulse: 68 61  Resp: 16 11  Temp:      Last Pain:  Vitals:   03/19/17 1016  TempSrc: Tympanic                 Makhai Fulco

## 2017-03-19 NOTE — Anesthesia Post-op Follow-up Note (Cosign Needed)
Anesthesia QCDR form completed.        

## 2017-03-19 NOTE — Op Note (Signed)
[facility] Gastroenterology Patient Name: Kristi Greer Procedure Date: 03/19/2017 12:05 PM MRN: [id number] Account #: [id number] Date of Birth: [birth year] Admit Type: Outpatient Age: 65 Room: [facility] ENDO ROOM 1 Gender: Female Note Status: Finalized Procedure:            Colonoscopy Indications:          High risk colon cancer surveillance: Personal history                        of colonic polyps Providers:            Lollie Sails, MD Referring MD:         Einar Pheasant, MD (Referring MD) Medicines:            Monitored Anesthesia Care Complications:        No immediate complications. Procedure:            Pre-Anesthesia Assessment:                       - ASA Grade Assessment: III - A patient with severe                        systemic disease.                       After obtaining informed consent, the colonoscope was                        passed under direct vision. Throughout the procedure,                        the patient's blood pressure, pulse, and oxygen                        saturations were monitored continuously. The                        Colonoscope was introduced through the anus and                        advanced to the the terminal ileum. The colonoscopy was                        performed without difficulty. The patient tolerated the                        procedure well. The quality of the bowel preparation                        was fair. Findings:      Multiple small-mouthed diverticula were found in the sigmoid colon and       descending colon.      A 1 mm polyp was found in the transverse colon. The polyp was sessile.       The polyp was removed with a cold biopsy forceps. Resection and       retrieval were complete.      Non-bleeding internal hemorrhoids were found during perianal exam and       during anoscopy. The hemorrhoids were small and Grade II (internal       hemorrhoids that prolapse but reduce spontaneously).  The terminal ileum appeared normal. Impression:           - Preparation of the colon was fair.                       - Diverticulosis in the sigmoid colon and in the                        descending colon.                       - One 1 mm polyp in the transverse colon, removed with                        a cold biopsy forceps. Resected and retrieved.                       - Non-bleeding internal hemorrhoids. Recommendation:       - Discharge patient to home.                       - Await pathology results. Procedure Code(s):    --- Professional ---                       [phone], Colonoscopy, flexible; with biopsy, single or                        multiple Diagnosis Code(s):    --- Professional ---                       Z86.010, Personal history of colonic polyps                       K64.1, Second degree hemorrhoids                       D12.3, Benign neoplasm of transverse colon (hepatic                        flexure or splenic flexure)                       K57.30, Diverticulosis of large intestine without                        perforation or abscess without bleeding CPT copyright 2016 American Medical Association. All rights reserved. The codes documented in this report are preliminary and upon coder review may  be revised to meet current compliance requirements. Lollie Sails, MD 03/19/2017 12:31:43 PM This report has been signed electronically. Number of Addenda: 0 Note Initiated On: 03/19/2017 12:05 PM Scope Withdrawal Time: 0 hours 8 minutes 38 seconds  Total Procedure Duration: 0 hours 15 minutes 52 seconds       [facility]

## 2017-03-19 NOTE — Transfer of Care (Signed)
Immediate Anesthesia Transfer of Care Note  Patient: Kristi Greer  Procedure(s) Performed: Procedure(s): COLONOSCOPY WITH PROPOFOL (N/A)  Patient Location: PACU  Anesthesia Type:General  Level of Consciousness: awake  Airway & Oxygen Therapy: Patient Spontanous Breathing and Patient connected to nasal cannula oxygen  Post-op Assessment: Report given to RN and Post -op Vital signs reviewed and stable  Post vital signs: Reviewed and stable  Last Vitals:  Vitals:   03/19/17 1016  BP: 103/78  Pulse: 95  Resp: 20  Temp: 36.9 C    Last Pain:  Vitals:   03/19/17 1016  TempSrc: Tympanic         Complications: No apparent anesthesia complications

## 2017-03-19 NOTE — Anesthesia Preprocedure Evaluation (Signed)
Anesthesia Evaluation  Patient identified by MRN, date of birth, ID band Patient awake    Reviewed: Allergy & Precautions, NPO status , Patient's Chart, lab work & pertinent test results, reviewed documented beta blocker date and time   History of Anesthesia Complications (+) PONV and history of anesthetic complications  Airway Mallampati: II  TM Distance: >3 FB Neck ROM: Full    Dental no notable dental hx.    Pulmonary neg pulmonary ROS, shortness of breath, neg sleep apnea, neg COPD,    breath sounds clear to auscultation- rhonchi (-) wheezing      Cardiovascular hypertension, Pt. on medications and Pt. on home beta blockers +CHF  (-) CAD, (-) Past MI and (-) Cardiac Stents + dysrhythmias Supra Ventricular Tachycardia  Rhythm:Regular Rate:Normal - Systolic murmurs and - Diastolic murmurs Echo 04/05/20: - Left ventricle: The cavity size was normal. There was mild   concentric hypertrophy. Systolic function was mildly to   moderately reduced. The estimated ejection fraction was in the   range of 40% to 45%. Diffuse hypokinesis. Doppler parameters are   consistent with abnormal left ventricular relaxation (grade 1   diastolic dysfunction).  L heart cath 11/26/15: 1. Normal coronary arteries 2. 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 Well compensated hemodynamics    Neuro/Psych  Headaches,  Neuromuscular disease negative psych ROS   GI/Hepatic negative GI ROS, Neg liver ROS, Hx of colon polyps   Endo/Other  negative endocrine ROSneg diabetes  Renal/GU negative Renal ROS Bladder dysfunction      Musculoskeletal   Abdominal (+) + obese,   Peds negative pediatric ROS (+)  Hematology negative hematology ROS (+)   Anesthesia Other Findings Past Medical History: No date: Allergy No date: Cancer [facility])     Comment:  basal cell No date: Cardiomyopathy, nonischemic (HCC) No date: Carpal tunnel syndrome  Comment:  bilateral No date: Congestive heart failure (HCC) No date: Dyspnea     Comment:  on exertion No date: Dysrhythmia     Comment:  SVT No date: Female cystocele No date: Hyperlipidemia No date: Hypertension No date: Migraines 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 No date: Personal history of colonic polyps No date: PONV (postoperative nausea and vomiting) No date: SUI (stress urinary incontinence, female) No date: Uterovaginal prolapse No date: Vaginal atrophy  Reproductive/Obstetrics                             Anesthesia Physical  Anesthesia Plan  ASA: III  Anesthesia Plan: General   Post-op Pain Management:    Induction: Intravenous  PONV Risk Score and Plan:   Airway Management Planned: Nasal Cannula  Additional Equipment:   Intra-op Plan:   Post-operative Plan:   Informed Consent: I have reviewed the patients History and Physical, chart, labs and discussed the procedure including the risks, benefits and alternatives for the proposed anesthesia with the patient or authorized representative who has indicated his/her understanding and acceptance.   Dental advisory given  Plan Discussed with: CRNA and Surgeon  Anesthesia Plan Comments:         Anesthesia Quick Evaluation

## 2017-03-19 NOTE — H&P (Signed)
Outpatient short stay form Pre-procedure 03/19/2017 11:59 AM Lollie Sails MD  Primary Physician: Dr. Einar Pheasant  Reason for visit:  Colonoscopy  History of present illness:  Patient is a 66 year old female presenting today as above. She has a personal history of adenomatous colon polyps. Is also a history of a colonic ulcer on the ileocecal valve there was noted be resolved on subsequent procedure. She tolerated her prep well. She takes an 81 mg aspirin but is held that for several days. She takes no other aspirin products or blood thinning agents.    Current Facility-Administered Medications:  .  0.9 %  sodium chloride infusion, , Intravenous, Continuous, Lollie Sails, MD, Last Rate: 20 mL/hr at 03/19/17 1038  Prescriptions Prior to Admission  Medication Sig Dispense Refill Last Dose  . calcium carbonate (OSCAL) 1500 (600 Ca) MG TABS tablet Take by mouth 2 (two) times daily with a meal.   Past Week at Unknown time  . carvedilol (COREG) 12.5 MG tablet TAKE 1 TABLET (12.5 MG TOTAL) BY MOUTH 2 (TWO) TIMES DAILY WITH A MEAL. 180 tablet 3 03/19/2017 at Unknown time  . Cholecalciferol 1000 units capsule Take 1,000 Units by mouth daily.   03/18/2017 at Unknown time  . diphenhydramine-acetaminophen (TYLENOL PM) 25-500 MG TABS tablet Take 1 tablet by mouth at bedtime as needed.   Past Week at Unknown time  . fexofenadine (ALLEGRA) 180 MG tablet Take 180 mg by mouth at bedtime.    03/18/2017 at Unknown time  . furosemide (LASIX) 20 MG tablet TAKE 1 TABLET (20 MG TOTAL) BY MOUTH EVERY OTHER DAY. 90 tablet 1 Past Week at Unknown time  . lisinopril (PRINIVIL,ZESTRIL) 5 MG tablet Take 1 tablet (5 mg total) by mouth at bedtime. Reported on 11/11/2015 90 tablet 3 03/18/2017 at Unknown time  . pantoprazole (PROTONIX) 40 MG tablet Take 1 tablet (40 mg total) by mouth 2 (two) times daily before a meal. 60 tablet 1 03/19/2017 at Unknown time  . simvastatin (ZOCOR) 10 MG tablet TAKE 1 TABLET (10 MG  TOTAL) BY MOUTH DAILY AT 6 PM. 30 tablet 11 03/18/2017 at Unknown time  . spironolactone (ALDACTONE) 25 MG tablet TAKE 0.5 TABLETS (12.5 MG TOTAL) BY MOUTH DAILY. 45 tablet 3 03/18/2017 at Unknown time  . SUMAtriptan (IMITREX) 100 MG tablet Take 1 tablet (100 mg total) by mouth daily as needed for migraine. May repeat in 2 hours if headache persists or recurs. 10 tablet 0 Past Month at Unknown time  . zonisamide (ZONEGRAN) 100 MG capsule TAKE 1 CAPSULE (100 MG TOTAL) BY MOUTH ONCE DAILY. PATIENT NEEDS APPOINTMENT 90 capsule 0 Past Week at Unknown time  . aspirin 81 MG tablet Take 81 mg by mouth daily.   03/16/2017  . OVER THE COUNTER MEDICATION Take 1 tablet by mouth daily.   Taking  . OVER THE COUNTER MEDICATION Take 1 packet by mouth 2 (two) times daily.   Taking     No Known Allergies   Past Medical History:  Diagnosis Date  . Allergy   . Cancer ([place])    basal cell  . Cardiomyopathy, nonischemic ([place])   . Carpal tunnel syndrome    bilateral  . Congestive heart failure ([place])   . Dyspnea    on exertion  . Dysrhythmia    SVT  . Female cystocele   . Hyperlipidemia   . Hypertension   . Migraines   . Overactive detrusor   . Personal history of colonic polyps   .  PONV (postoperative nausea and vomiting)   . SUI (stress urinary incontinence, female)   . Uterovaginal prolapse   . Vaginal atrophy     Review of systems:      Physical Exam    Heart and lungs: Regular rate and rhythm without rub or gallop, lungs are bilaterally clear.    HEENT: Normocephalic atraumatic eyes are anicteric    Other:     Pertinant exam for procedure: Soft nontender nondistended bowel sounds positive normoactive.    Planned proceedures: Colonoscopy and indicated procedures. I have discussed the risks benefits and complications of procedures to include not limited to bleeding, infection, perforation and the risk of sedation and the patient wishes to proceed.    Lollie Sails,  MD Gastroenterology 03/19/2017  11:59 AM

## 2017-03-20 ENCOUNTER — Encounter: Payer: Self-pay | Admitting: Internal Medicine

## 2017-03-20 ENCOUNTER — Encounter: Payer: Self-pay | Admitting: Gastroenterology

## 2017-03-20 ENCOUNTER — Other Ambulatory Visit (INDEPENDENT_AMBULATORY_CARE_PROVIDER_SITE_OTHER): Payer: Managed Care, Other (non HMO)

## 2017-03-20 DIAGNOSIS — E7800 Pure hypercholesterolemia, unspecified: Secondary | ICD-10-CM | POA: Diagnosis not present

## 2017-03-20 LAB — BASIC METABOLIC PANEL
BUN: 17 mg/dL (ref 6–23)
CALCIUM: 9.4 mg/dL (ref 8.4–10.5)
CO2: 26 mEq/L (ref 19–32)
CREATININE: 0.9 mg/dL (ref 0.40–1.20)
Chloride: 109 mEq/L (ref 96–112)
GFR: 66.94 mL/min (ref >60.00)
GLUCOSE: 107 mg/dL — AB (ref 70–99)
Potassium: 4.3 mEq/L (ref 3.5–5.1)
Sodium: 140 mEq/L (ref 135–145)

## 2017-03-20 LAB — HEPATIC FUNCTION PANEL
ALT: 14 U/L (ref 0–35)
AST: 17 U/L (ref 0–37)
Albumin: 3.9 g/dL (ref 3.5–5.2)
Alkaline Phosphatase: 91 U/L (ref 39–117)
BILIRUBIN TOTAL: 0.4 mg/dL (ref 0.2–1.2)
Bilirubin, Direct: 0.1 mg/dL (ref 0.0–0.3)
Total Protein: 6.5 g/dL (ref 6.0–8.3)

## 2017-03-20 LAB — LIPID PANEL
CHOLESTEROL: 149 mg/dL (ref 0–200)
HDL: 41.7 mg/dL (ref >39.00)
LDL CALC: 78 mg/dL (ref 0–99)
NonHDL: 107.12
TRIGLYCERIDES: 145 mg/dL (ref 0.0–149.0)
Total CHOL/HDL Ratio: 4
VLDL: 29 mg/dL (ref 0.0–40.0)

## 2017-03-20 LAB — SURGICAL PATHOLOGY

## 2017-03-21 ENCOUNTER — Encounter: Payer: Self-pay | Admitting: Internal Medicine

## 2017-03-21 ENCOUNTER — Ambulatory Visit (INDEPENDENT_AMBULATORY_CARE_PROVIDER_SITE_OTHER): Payer: Managed Care, Other (non HMO) | Admitting: Internal Medicine

## 2017-03-21 VITALS — BP 108/60 | HR 66 | Temp 98.4°F | Resp 12 | Ht 61.0 in | Wt 167.4 lb

## 2017-03-21 DIAGNOSIS — I5022 Chronic systolic (congestive) heart failure: Secondary | ICD-10-CM

## 2017-03-21 DIAGNOSIS — Z860100 Personal history of colon polyps, unspecified: Secondary | ICD-10-CM

## 2017-03-21 DIAGNOSIS — G5600 Carpal tunnel syndrome, unspecified upper limb: Secondary | ICD-10-CM

## 2017-03-21 DIAGNOSIS — I472 Ventricular tachycardia: Secondary | ICD-10-CM

## 2017-03-21 DIAGNOSIS — I471 Supraventricular tachycardia, unspecified: Secondary | ICD-10-CM

## 2017-03-21 DIAGNOSIS — E7800 Pure hypercholesterolemia, unspecified: Secondary | ICD-10-CM | POA: Diagnosis not present

## 2017-03-21 DIAGNOSIS — R8290 Unspecified abnormal findings in urine: Secondary | ICD-10-CM

## 2017-03-21 DIAGNOSIS — I420 Dilated cardiomyopathy: Secondary | ICD-10-CM

## 2017-03-21 DIAGNOSIS — I4729 Other ventricular tachycardia: Secondary | ICD-10-CM

## 2017-03-21 LAB — URINALYSIS, ROUTINE W REFLEX MICROSCOPIC
Bilirubin Urine: NEGATIVE
Ketones, ur: NEGATIVE
Nitrite: NEGATIVE
Specific Gravity, Urine: >=1.03 — AB (ref 1.000–1.030)
TOTAL PROTEIN, URINE-UPE24: NEGATIVE
Urine Glucose: NEGATIVE
Urobilinogen, UA: 0.2 (ref 0.0–1.0)
pH: 5.5 (ref 5.0–8.0)

## 2017-03-21 NOTE — Progress Notes (Signed)
Pre-visit discussion using our clinic review tool. No additional management support is needed unless otherwise documented below in the visit note.  

## 2017-03-21 NOTE — Progress Notes (Signed)
[name], female   DOB: April 09, 1951, 66 y.o.   MRN: [id number]   Subjective:    [name], female    DOB: Sep 07, 1950, 66 y.o.   MRN: [id number]  HPI  Patient here for a scheduled follow up.  She reports she is doing relatively well.  Is s/p carpal tunnel release 01/26/17.  Doing well.  Breathing is stable.  She overall feels better.  Still with increased stress at work.  Overall she feels she is handling things relatively well.  No chest pain.  No acid reflux.  No abdominal pain.  Bowels moving.  Has f/u planned with cardiology.  Had colonoscopy 03/19/17.  One small polyp removed.  Waiting for pathology.  She reports hs noticed cloudy urine. Also has noticed an odor and a little pink tinge to her urine.   Past Medical History:  Diagnosis Date  . Allergy   . Cancer ([place])    basal cell  . Cardiomyopathy, nonischemic ([place])   . Carpal tunnel syndrome    bilateral  . Congestive heart failure ([place])   . Dyspnea    on exertion  . Dysrhythmia    SVT  . Female cystocele   . Hyperlipidemia   . Hypertension   . Migraines   . Overactive detrusor   . Personal history of colonic polyps   . PONV (postoperative nausea and vomiting)   . SUI (stress urinary incontinence, female)   . Uterovaginal prolapse   . Vaginal atrophy    Past Surgical History:  Procedure Laterality Date  . ANTERIOR AND POSTERIOR VAGINAL REPAIR W/ SACROSPINOUS LIGAMENT SUSPENSION  10/18/2012  . CARDIAC CATHETERIZATION N/A 11/26/2015   Procedure: Right/Left Heart Cath and Coronary Angiography;  Surgeon: Jolaine Artist, MD;  Location: [facility];  Service: Cardiovascular;  Laterality: N/A;  . CHOLECYSTECTOMY  2006  . COLONOSCOPY WITH PROPOFOL N/A 03/19/2017   Procedure: COLONOSCOPY WITH PROPOFOL;  Surgeon: Lollie Sails, MD;  Location: [facility] ENDOSCOPY;  Service: Endoscopy;  Laterality: N/A;  . COLPOPEXY  10/18/2012  . CYSTOURETHROSCOPY  10/18/2012  .  ESOPHAGOGASTRODUODENOSCOPY (EGD) WITH PROPOFOL N/A 10/03/2016   Procedure: ESOPHAGOGASTRODUODENOSCOPY (EGD) WITH PROPOFOL;  Surgeon: Lollie Sails, MD;  Location: [facility] ENDOSCOPY;  Service: Endoscopy;  Laterality: N/A;  . EYE SURGERY     cataract extration  . FINGER SURGERY    . HYSTEROSCOPY  2011, 10/2010  . TONSILLECTOMY    . TUBAL LIGATION    . VAGINAL HYSTERECTOMY     midurethral sling   Family History  Problem Relation Age of Onset  . Hypertension Mother   . Stroke Mother   . Diabetes Mother   . Hypercholesterolemia Father   . Heart disease Father        MI  . Hypertension Father   . Stroke Father   . Breast cancer Neg Hx   . Colon cancer Neg Hx    Social History   Social History  . Marital status: Married    Spouse name: N/A  . Number of children: 2  . Years of education: N/A   Social History Main Topics  . Smoking status: Never Smoker  . Smokeless tobacco: Never Used  . Alcohol use 0.0 oz/week     Comment: Socially  . Drug use: No  . Sexual activity: Not Asked   Other Topics Concern  . None   Social History Narrative  . None    Outpatient Encounter Prescriptions as of 03/21/2017  Medication Sig  . aspirin 81 MG tablet Take 81 mg by mouth daily.  . calcium carbonate (OSCAL) 1500 (600 Ca) MG TABS tablet Take by mouth daily.   . carvedilol (COREG) 12.5 MG tablet TAKE 1 TABLET (12.5 MG TOTAL) BY MOUTH 2 (TWO) TIMES DAILY WITH A MEAL.  Marland Kitchen Cholecalciferol 1000 units capsule Take 1,000 Units by mouth daily.  . diphenhydramine-acetaminophen (TYLENOL PM) 25-500 MG TABS tablet Take 1 tablet by mouth at bedtime as needed.  . fexofenadine (ALLEGRA) 180 MG tablet Take 180 mg by mouth at bedtime.   . furosemide (LASIX) 20 MG tablet TAKE 1 TABLET (20 MG TOTAL) BY MOUTH EVERY OTHER DAY.  Marland Kitchen lisinopril (PRINIVIL,ZESTRIL) 5 MG tablet Take 1 tablet (5 mg total) by mouth at bedtime. Reported on 11/11/2015  . OVER THE COUNTER MEDICATION Take 1 tablet by mouth daily.  Marland Kitchen OVER  THE COUNTER MEDICATION Take 1 packet by mouth 2 (two) times daily.  . pantoprazole (PROTONIX) 40 MG tablet Take 1 tablet (40 mg total) by mouth 2 (two) times daily before a meal. (Patient taking differently: Take 40 mg by mouth daily. )  . simvastatin (ZOCOR) 10 MG tablet TAKE 1 TABLET (10 MG TOTAL) BY MOUTH DAILY AT 6 PM.  . spironolactone (ALDACTONE) 25 MG tablet TAKE 0.5 TABLETS (12.5 MG TOTAL) BY MOUTH DAILY.  . SUMAtriptan (IMITREX) 100 MG tablet Take 1 tablet (100 mg total) by mouth daily as needed for migraine. May repeat in 2 hours if headache persists or recurs.  Marland Kitchen zonisamide (ZONEGRAN) 100 MG capsule TAKE 1 CAPSULE (100 MG TOTAL) BY MOUTH ONCE DAILY. PATIENT NEEDS APPOINTMENT   No facility-administered encounter medications on file as of 03/21/2017.     Review of Systems  Constitutional: Negative for appetite change and unexpected weight change.  HENT: Negative for congestion and sinus pressure.   Respiratory: Negative for cough, chest tightness and shortness of breath.   Cardiovascular: Negative for chest pain, palpitations and leg swelling.  Gastrointestinal: Negative for abdominal pain, diarrhea, nausea and vomiting.  Genitourinary:       Cloudy urine.  Odor.  Pink tinge in urine.  No vaginal itching, burning, discharge or bleeding.    Musculoskeletal: Negative for joint swelling and myalgias.  Skin: Negative for color change and rash.  Neurological: Negative for dizziness, light-headedness and headaches.  Psychiatric/Behavioral: Negative for agitation and dysphoric mood.       Objective:    Physical Exam  Constitutional: She appears well-developed and well-nourished. No distress.  HENT:  Nose: Nose normal.  Mouth/Throat: Oropharynx is clear and moist.  Neck: Neck supple. No thyromegaly present.  Cardiovascular: Normal rate and regular rhythm.   Pulmonary/Chest: Breath sounds normal. No respiratory distress. She has no wheezes.  Abdominal: Soft. Bowel sounds are normal.  There is no tenderness.  Musculoskeletal: She exhibits no edema or tenderness.  Lymphadenopathy:    She has no cervical adenopathy.  Skin: No rash noted. No erythema.  Psychiatric: She has a normal mood and affect. Her behavior is normal.    BP 108/60 (BP Location: Left Arm, Patient Position: Sitting, Cuff Size: Normal)   Pulse 66   Temp 98.4 F (36.9 C) (Oral)   Resp 12   Ht 5\' 1"  (1.549 m)   Wt 167 lb 6.4 oz (75.9 kg)   LMP 10/18/2012   SpO2 97%   BMI 31.63 kg/m  Wt Readings from Last 3 Encounters:  03/21/17 167 lb 6.4 oz (75.9 kg)  03/19/17 160 lb (72.6 kg)  11/29/16 169 lb (76.7 kg)     Lab Results  Component Value Date   WBC 9.3 05/23/2016   HGB 13.7 05/23/2016   HCT 40.4 05/23/2016   PLT 223.0 05/23/2016   GLUCOSE 107 (H) 03/20/2017   CHOL 149 03/20/2017   TRIG 145.0 03/20/2017   HDL 41.70 03/20/2017   LDLCALC 78 03/20/2017   ALT 14 03/20/2017   AST 17 03/20/2017   NA 140 03/20/2017   K 4.3 03/20/2017   CL 109 03/20/2017   CREATININE 0.90 03/20/2017   BUN 17 03/20/2017   CO2 26 03/20/2017   TSH 0.55 05/23/2016   INR 0.98 11/26/2015       Assessment & Plan:   Problem List Items Addressed This Visit    Carpal tunnel syndrome    S/p carpal tunnel release.  Seeing ortho.  Doing well.        Chronic systolic heart failure (HCC) (Chronic)    Followed by cardiology.  Doing well on current regimen.  Last EF improved.        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HCC) (Chronic)    Doing well on current medication regimen.  Continue f/u with cardiology.       History of colonic polyps    Just had colonoscopy 03/19/17.  One polyp removed.  Waiting for pathology results.        Hypercholesterolemia    On simvastatin.  Low cholesterol diet and exercise.  Follow lipid panel and liver function tests.        NSVT (nonsustained ventricular tachycardia) (HCC) (Chronic)    Followed by cardiology.  On carvedilol.  Stable.        SVT (supraventricular  tachycardia) (HCC) (Chronic)    On carvedilol.  Stable.  Followed by cardiology.        Other Visit Diagnoses    Bad odor of urine    -  Primary   Noticed cloudy urine and odor.  Pink tinge in urine. No vaginal bleeding or symptoms.  Check urine to confirm if infection present.    Relevant Orders   Urinalysis, Routine w reflex microscopic (Completed)   Urine Culture (Completed)       Einar Pheasant, MD

## 2017-03-23 ENCOUNTER — Encounter: Payer: Self-pay | Admitting: Internal Medicine

## 2017-03-23 ENCOUNTER — Other Ambulatory Visit: Payer: Self-pay | Admitting: Internal Medicine

## 2017-03-23 LAB — URINE CULTURE

## 2017-03-23 MED ORDER — CEFDINIR 300 MG PO CAPS: 300.0000 mg | ORAL_CAPSULE | Freq: Two times a day (BID) | ORAL | 0 refills | Status: DC

## 2017-03-23 NOTE — Progress Notes (Signed)
rx sent in for omnicef.  See result note.

## 2017-03-24 IMAGING — MG MM DIGITAL SCREENING BILATERAL
1 series · 6 of 6 positions shown · non-contrast
Comparison: Previous exam(s).

ACR Breast Density Category a: The breast tissue is almost entirely
fatty.

CLINICAL DATA: Screening.

EXAM:
DIGITAL SCREENING BILATERAL MAMMOGRAM WITH CAD

[R CC · right · 6 of 6 slices shown]
[im 1/6]
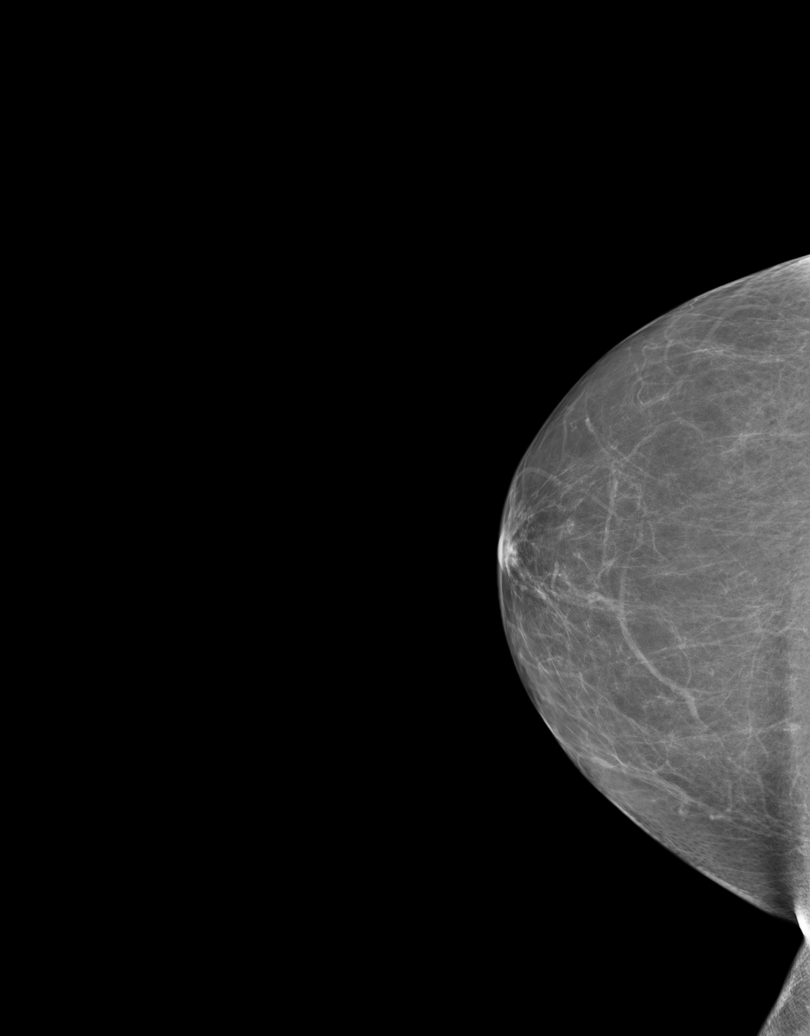
[im 2/6]
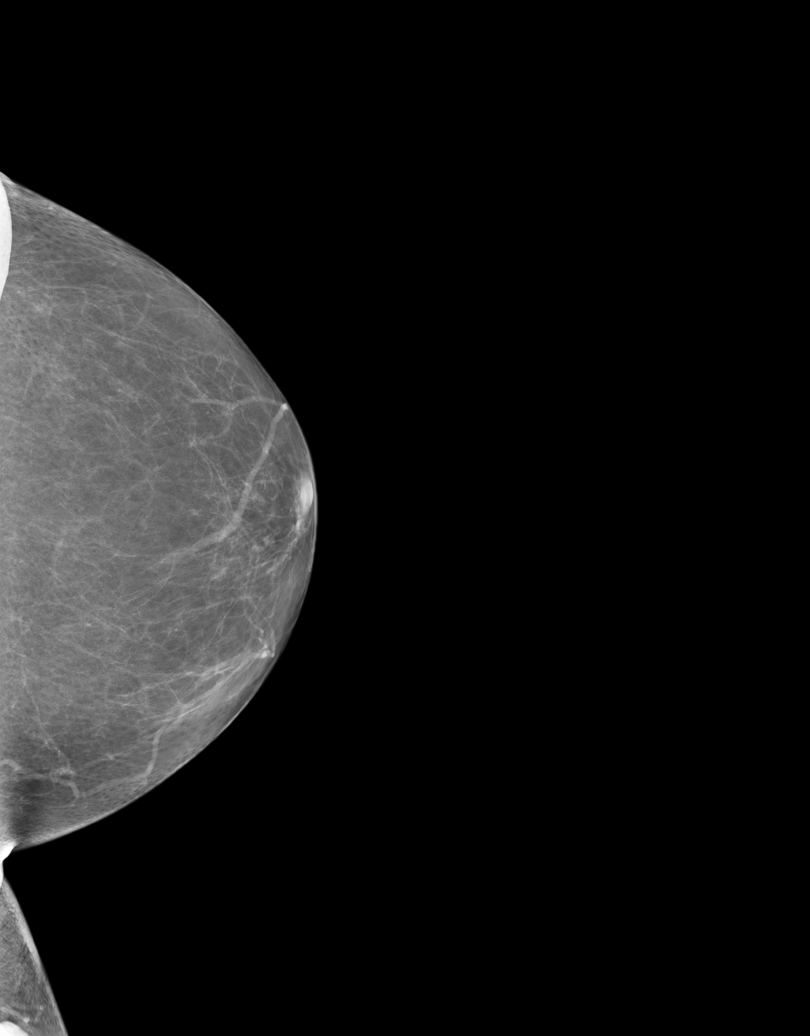
[im 3/6]
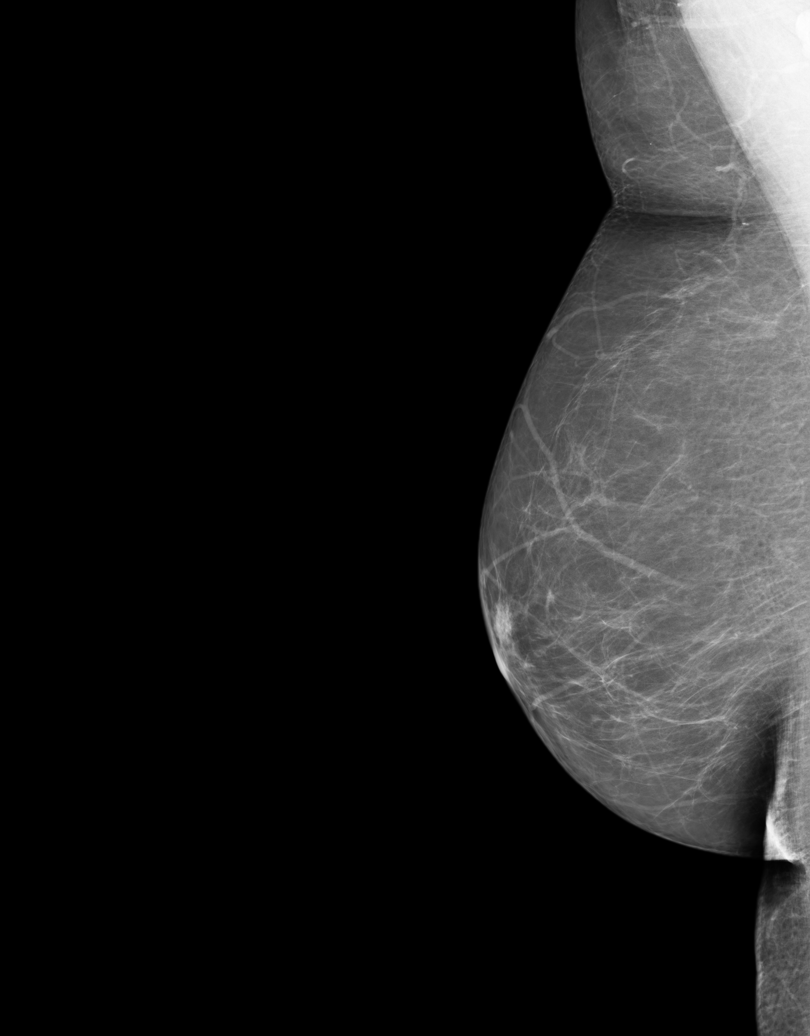
[im 4/6]
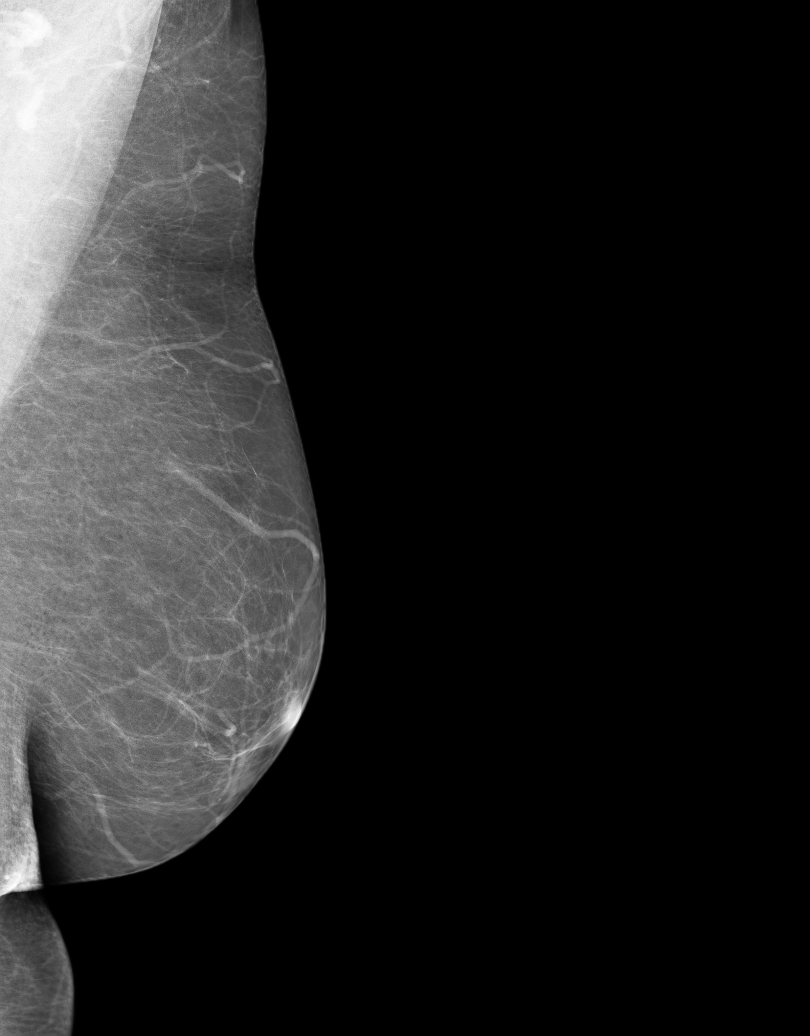
[im 5/6]
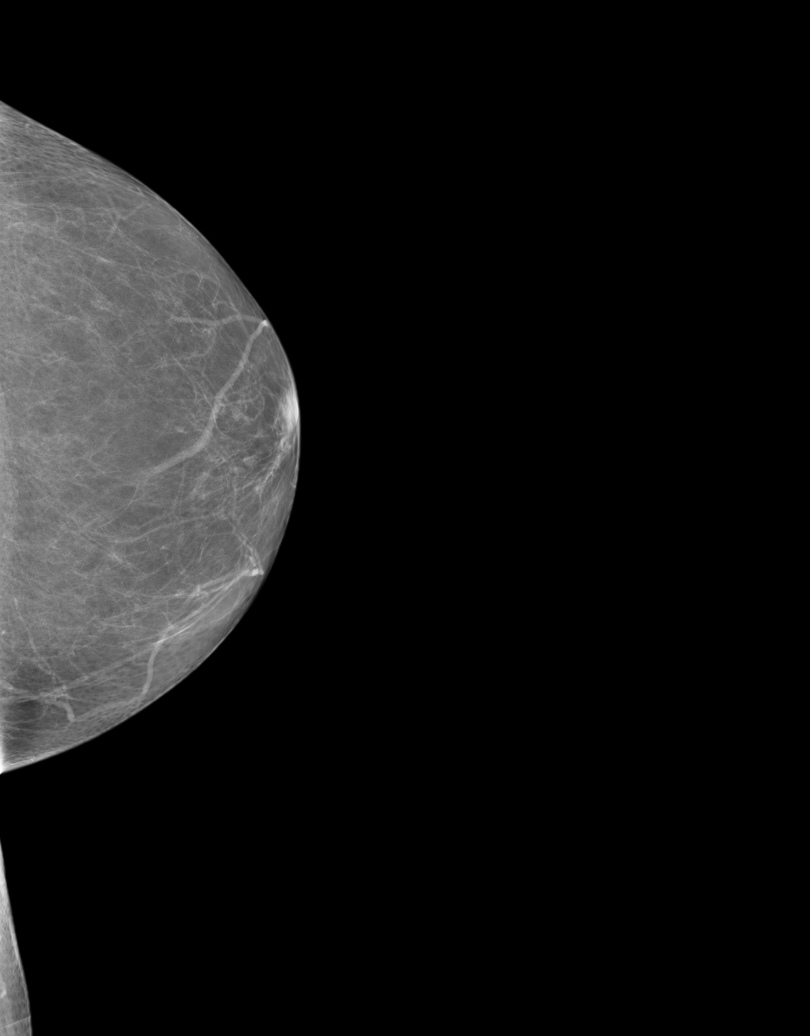
[im 6/6]
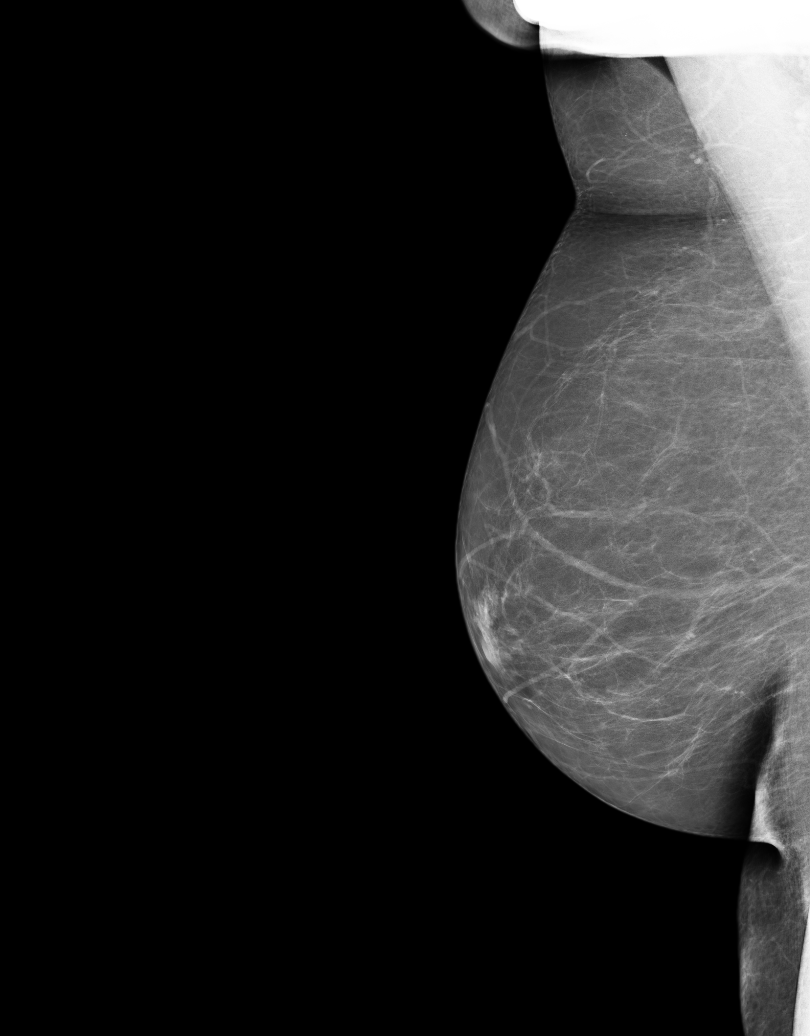

[6 of 6 positions shown; findings below may reference images not displayed]

FINDINGS: There are no findings suspicious for malignancy. Images were
processed with CAD.
IMPRESSION: No mammographic evidence of malignancy. A result letter of this
screening mammogram will be mailed directly to the patient.

RECOMMENDATION:
Screening mammogram in one year. (Code:[id number])

BI-RADS CATEGORY  1: Negative.

## 2017-03-24 NOTE — Assessment & Plan Note (Signed)
On carvedilol.  Stable.  Followed by cardiology.  

## 2017-03-24 NOTE — Assessment & Plan Note (Signed)
Followed by cardiology.  Doing well on current regimen.  Last EF improved.

## 2017-03-24 NOTE — Assessment & Plan Note (Signed)
Just had colonoscopy 03/19/17.  One polyp removed.  Waiting for pathology results.

## 2017-03-24 NOTE — Assessment & Plan Note (Signed)
On simvastatin.  Low cholesterol diet and exercise.  Follow lipid panel and liver function tests.   

## 2017-03-24 NOTE — Assessment & Plan Note (Signed)
Doing well on current medication regimen.  Continue f/u with cardiology.

## 2017-03-24 NOTE — Assessment & Plan Note (Signed)
S/p carpal tunnel release.  Seeing ortho.  Doing well.

## 2017-03-24 NOTE — Assessment & Plan Note (Signed)
Followed by cardiology.  On carvedilol.  Stable.

## 2017-04-13 ENCOUNTER — Other Ambulatory Visit: Payer: Self-pay | Admitting: Internal Medicine

## 2017-04-19 ENCOUNTER — Other Ambulatory Visit: Payer: Self-pay | Admitting: Internal Medicine

## 2017-06-22 ENCOUNTER — Encounter: Payer: Self-pay | Admitting: Internal Medicine

## 2017-06-22 ENCOUNTER — Other Ambulatory Visit: Payer: Self-pay | Admitting: Internal Medicine

## 2017-06-22 ENCOUNTER — Ambulatory Visit (INDEPENDENT_AMBULATORY_CARE_PROVIDER_SITE_OTHER): Payer: 59 | Admitting: Internal Medicine

## 2017-06-22 ENCOUNTER — Other Ambulatory Visit (INDEPENDENT_AMBULATORY_CARE_PROVIDER_SITE_OTHER): Payer: 59

## 2017-06-22 ENCOUNTER — Ambulatory Visit (INDEPENDENT_AMBULATORY_CARE_PROVIDER_SITE_OTHER): Payer: 59

## 2017-06-22 VITALS — BP 110/58 | HR 75 | Temp 98.6°F | Resp 14 | Ht 62.0 in | Wt 170.0 lb

## 2017-06-22 DIAGNOSIS — R739 Hyperglycemia, unspecified: Secondary | ICD-10-CM

## 2017-06-22 DIAGNOSIS — R7989 Other specified abnormal findings of blood chemistry: Secondary | ICD-10-CM

## 2017-06-22 DIAGNOSIS — I471 Supraventricular tachycardia: Secondary | ICD-10-CM | POA: Diagnosis not present

## 2017-06-22 DIAGNOSIS — Z1231 Encounter for screening mammogram for malignant neoplasm of breast: Secondary | ICD-10-CM

## 2017-06-22 DIAGNOSIS — E7800 Pure hypercholesterolemia, unspecified: Secondary | ICD-10-CM | POA: Diagnosis not present

## 2017-06-22 DIAGNOSIS — Z0000 Encounter for general adult medical examination without abnormal findings: Secondary | ICD-10-CM | POA: Diagnosis not present

## 2017-06-22 DIAGNOSIS — I420 Dilated cardiomyopathy: Secondary | ICD-10-CM

## 2017-06-22 DIAGNOSIS — R0789 Other chest pain: Secondary | ICD-10-CM | POA: Diagnosis not present

## 2017-06-22 DIAGNOSIS — Z1239 Encounter for other screening for malignant neoplasm of breast: Secondary | ICD-10-CM

## 2017-06-22 DIAGNOSIS — K219 Gastro-esophageal reflux disease without esophagitis: Secondary | ICD-10-CM

## 2017-06-22 LAB — BASIC METABOLIC PANEL
BUN: 20 mg/dL (ref 6–23)
CHLORIDE: 108 meq/L (ref 96–112)
CO2: 29 mEq/L (ref 19–32)
Calcium: 9.5 mg/dL (ref 8.4–10.5)
Creatinine, Ser: 0.93 mg/dL (ref 0.40–1.20)
GFR: 64.41 mL/min (ref >60.00)
Glucose, Bld: 104 mg/dL — ABNORMAL HIGH (ref 70–99)
POTASSIUM: 4.5 meq/L (ref 3.5–5.1)
Sodium: 142 mEq/L (ref 135–145)

## 2017-06-22 LAB — CBC WITH DIFFERENTIAL/PLATELET
BASOS ABS: 0 10*3/uL (ref 0.0–0.1)
Basophils Relative: 0.5 % (ref 0.0–3.0)
Eosinophils Absolute: 0.2 10*3/uL (ref 0.0–0.7)
Eosinophils Relative: 2.6 % (ref 0.0–5.0)
HEMATOCRIT: 42.7 % (ref 36.0–46.0)
Hemoglobin: 13.5 g/dL (ref 12.0–15.0)
LYMPHS PCT: 31.8 % (ref 12.0–46.0)
Lymphs Abs: 2.9 10*3/uL (ref 0.7–4.0)
MCHC: 31.7 g/dL (ref 30.0–36.0)
MCV: 88.6 fl (ref 78.0–100.0)
MONOS PCT: 6.6 % (ref 3.0–12.0)
Monocytes Absolute: 0.6 10*3/uL (ref 0.1–1.0)
NEUTROS ABS: 5.3 10*3/uL (ref 1.4–7.7)
Neutrophils Relative %: 58.5 % (ref 43.0–77.0)
Platelets: 242 10*3/uL (ref 150.0–400.0)
RBC: 4.82 Mil/uL (ref 3.87–5.11)
RDW: 14.1 % (ref 11.5–15.5)
WBC: 9.1 10*3/uL (ref 4.0–10.5)

## 2017-06-22 LAB — LIPID PANEL
CHOL/HDL RATIO: 3
Cholesterol: 152 mg/dL (ref 0–200)
HDL: 50.7 mg/dL (ref >39.00)
LDL Cholesterol: 89 mg/dL (ref 0–99)
NONHDL: 101.54
Triglycerides: 64 mg/dL (ref 0.0–149.0)
VLDL: 12.8 mg/dL (ref 0.0–40.0)

## 2017-06-22 LAB — HEPATIC FUNCTION PANEL
ALBUMIN: 3.9 g/dL (ref 3.5–5.2)
ALK PHOS: 93 U/L (ref 39–117)
ALT: 16 U/L (ref 0–35)
AST: 20 U/L (ref 0–37)
Bilirubin, Direct: 0.2 mg/dL (ref 0.0–0.3)
TOTAL PROTEIN: 6.6 g/dL (ref 6.0–8.3)
Total Bilirubin: 0.4 mg/dL (ref 0.2–1.2)

## 2017-06-22 LAB — TSH: TSH: 0.03 u[IU]/mL — ABNORMAL LOW (ref 0.35–4.50)

## 2017-06-22 LAB — HEMOGLOBIN A1C: HEMOGLOBIN A1C: 6 % (ref 4.6–6.5)

## 2017-06-22 NOTE — Assessment & Plan Note (Signed)
Physical today 06/22/17.  PAP 04/09/15 - negative with negative HPV.  Mammogram scheduled today.  Colonoscopy 03/19/17 - fair prep and results as outlined.

## 2017-06-22 NOTE — Progress Notes (Signed)
Order placed for add on lab.  °

## 2017-06-22 NOTE — Progress Notes (Signed)
[name], female   DOB: [birth year], 66 y.o.   MRN: [id number]   Subjective:    [name], female    DOB: 06-19-1951, 66 y.o.   MRN: [id number]  HPI  Patient here for a physical exam.  She reports she is doing relatively well.  Still with increased stress.  Contemplating retiring at 53.  Reports some increased acid and gas.  Increased belching.  She is only on acid medication - now daily.  When on two per day, symptoms better.  She is eating.  No swallowing problems.  Occasionally will feel chest tightness and feel like she cannot get a good breath.  No pain with deep breathing.  No abdominal pain.  Bowels moving.     Past Medical History:  Diagnosis Date  . Allergy   . Cancer ([place])    basal cell  . Cardiomyopathy, nonischemic ([place])   . Carpal tunnel syndrome    bilateral  . Congestive heart failure ([place])   . Dyspnea    on exertion  . Dysrhythmia    SVT  . Female cystocele   . Hyperlipidemia   . Hypertension   . Migraines   . Overactive detrusor   . Personal history of colonic polyps   . PONV (postoperative nausea and vomiting)   . SUI (stress urinary incontinence, female)   . Uterovaginal prolapse   . Vaginal atrophy    Past Surgical History:  Procedure Laterality Date  . ANTERIOR AND POSTERIOR VAGINAL REPAIR W/ SACROSPINOUS LIGAMENT SUSPENSION  10/18/2012  . CARDIAC CATHETERIZATION N/A 11/26/2015   Procedure: Right/Left Heart Cath and Coronary Angiography;  Surgeon: Jolaine Artist, MD;  Location: [facility];  Service: Cardiovascular;  Laterality: N/A;  . CHOLECYSTECTOMY  2006  . COLONOSCOPY WITH PROPOFOL N/A 03/19/2017   Procedure: COLONOSCOPY WITH PROPOFOL;  Surgeon: Lollie Sails, MD;  Location[facility]COPY;  Service: Endoscopy;  Laterality: N/A;  . COLPOPEXY  10/18/2012  . CYSTOURETHROSCOPY  10/18/2012  . ESOPHAGOGASTRODUODENOSCOPY (EGD) WITH PROPOFOL N/A 10/03/2016   Procedure: ESOPHAGOGASTRODUODENOSCOPY  (EGD) WITH PROPOFOL;  Surgeon: Lollie Sails, MD;  Location: [facility] ENDOSCOPY;  Service: Endoscopy;  Laterality: N/A;  . EYE SURGERY     cataract extration  . FINGER SURGERY    . HYSTEROSCOPY  2011, 10/2010  . TONSILLECTOMY    . TUBAL LIGATION    . VAGINAL HYSTERECTOMY     midurethral sling   Family History  Problem Relation Age of Onset  . Hypertension Mother   . Stroke Mother   . Diabetes Mother   . Hypercholesterolemia Father   . Heart disease Father        MI  . Hypertension Father   . Stroke Father   . Breast cancer Neg Hx   . Colon cancer Neg Hx    Social History   Social History  . Marital status: Married    Spouse name: N/A  . Number of children: 2  . Years of education: N/A   Social History Main Topics  . Smoking status: Never Smoker  . Smokeless tobacco: Never Used  . Alcohol use 0.0 oz/week     Comment: Socially  . Drug use: No  . Sexual activity: Not Asked   Other Topics Concern  . None   Social History Narrative  . None    Outpatient Encounter Prescriptions as of 06/22/2017  Medication Sig  . aspirin 81 MG tablet Take 81 mg by mouth daily.  Marland Kitchen  calcium carbonate (OSCAL) 1500 (600 Ca) MG TABS tablet Take by mouth daily.   . carvedilol (COREG) 12.5 MG tablet TAKE 1 TABLET (12.5 MG TOTAL) BY MOUTH 2 (TWO) TIMES DAILY WITH A MEAL.  Marland Kitchen Cholecalciferol 1000 units capsule Take 1,000 Units by mouth daily.  . diphenhydramine-acetaminophen (TYLENOL PM) 25-500 MG TABS tablet Take 1 tablet by mouth at bedtime as needed.  . fexofenadine (ALLEGRA) 180 MG tablet Take 180 mg by mouth at bedtime.   . furosemide (LASIX) 20 MG tablet TAKE 1 TABLET (20 MG TOTAL) BY MOUTH EVERY OTHER DAY.  Marland Kitchen lisinopril (PRINIVIL,ZESTRIL) 5 MG tablet Take 1 tablet (5 mg total) by mouth at bedtime. Reported on 11/11/2015  . pantoprazole (PROTONIX) 40 MG tablet Take 1 tablet (40 mg total) by mouth 2 (two) times daily before a meal. (Patient taking differently: Take 40 mg by mouth daily. )    . simvastatin (ZOCOR) 10 MG tablet TAKE 1 TABLET (10 MG TOTAL) BY MOUTH DAILY AT 6 PM.  . spironolactone (ALDACTONE) 25 MG tablet TAKE 0.5 TABLETS (12.5 MG TOTAL) BY MOUTH DAILY.  . SUMAtriptan (IMITREX) 100 MG tablet TAKE 1 TABLET BY MOUTH EVERY DAY AS NEEDED FOR MIGRAINE. MAY REPEAT IN 2 HOURS IF HEADACHE  . zonisamide (ZONEGRAN) 100 MG capsule Take 1 capsule (100 mg total) by mouth daily.  . [DISCONTINUED] cefdinir (OMNICEF) 300 MG capsule Take 1 capsule (300 mg total) by mouth 2 (two) times daily.  . [DISCONTINUED] OVER THE COUNTER MEDICATION Take 1 tablet by mouth daily.  . [DISCONTINUED] OVER THE COUNTER MEDICATION Take 1 packet by mouth 2 (two) times daily.   No facility-administered encounter medications on file as of 06/22/2017.     Review of Systems  Constitutional: Negative for appetite change and unexpected weight change.  HENT: Negative for congestion and sinus pressure.   Eyes: Negative for pain and visual disturbance.  Respiratory: Positive for chest tightness. Negative for cough.        Has the feeling of not being able to get a good breath at times.    Cardiovascular: Negative for chest pain, palpitations and leg swelling.  Gastrointestinal: Negative for abdominal pain, diarrhea, nausea and vomiting.       Increased gas and bloating.    Genitourinary: Negative for difficulty urinating and dysuria.  Musculoskeletal: Negative for back pain and joint swelling.  Skin: Negative for color change and rash.  Neurological: Negative for dizziness, light-headedness and headaches.  Hematological: Negative for adenopathy. Does not bruise/bleed easily.  Psychiatric/Behavioral: Negative for agitation and dysphoric mood.       Objective:     Blood pressure rechecked by me:  108/68  Physical Exam  Constitutional: She is oriented to person, place, and time. She appears well-developed and well-nourished. No distress.  HENT:  Nose: Nose normal.  Mouth/Throat: Oropharynx is clear  and moist.  Eyes: Right eye exhibits no discharge. Left eye exhibits no discharge. No scleral icterus.  Neck: Neck supple. No thyromegaly present.  Cardiovascular: Normal rate and regular rhythm.   Pulmonary/Chest: Breath sounds normal. No accessory muscle usage. No tachypnea. No respiratory distress. She has no decreased breath sounds. She has no wheezes. She has no rhonchi. Right breast exhibits no inverted nipple, no mass, no nipple discharge and no tenderness (no axillary adenopathy). Left breast exhibits no inverted nipple, no mass, no nipple discharge and no tenderness (no axilarry adenopathy).  Abdominal: Soft. Bowel sounds are normal. There is no tenderness.  Musculoskeletal: She exhibits no edema or tenderness.  Lymphadenopathy:    She has no cervical adenopathy.  Neurological: She is alert and oriented to person, place, and time.  Skin: Skin is warm. No rash noted. No erythema.  Psychiatric: She has a normal mood and affect. Her behavior is normal.    BP (!) 110/58 (BP Location: Left Arm, Patient Position: Sitting, Cuff Size: Normal)   Pulse 75   Temp 98.6 F (37 C) (Oral)   Resp 14   Ht 5\' 2"  (1.575 m)   Wt 170 lb (77.1 kg)   LMP 10/18/2012   SpO2 95%   BMI 31.09 kg/m  Wt Readings from Last 3 Encounters:  06/22/17 170 lb (77.1 kg)  03/21/17 167 lb 6.4 oz (75.9 kg)  03/19/17 160 lb (72.6 kg)     Lab Results  Component Value Date   WBC 9.1 06/22/2017   HGB 13.5 06/22/2017   HCT 42.7 06/22/2017   PLT 242.0 06/22/2017   GLUCOSE 104 (H) 06/22/2017   CHOL 152 06/22/2017   TRIG 64.0 06/22/2017   HDL 50.70 06/22/2017   LDLCALC 89 06/22/2017   ALT 16 06/22/2017   AST 20 06/22/2017   NA 142 06/22/2017   K 4.5 06/22/2017   CL 108 06/22/2017   CREATININE 0.93 06/22/2017   BUN 20 06/22/2017   CO2 29 06/22/2017   TSH 0.03 (L) 06/22/2017   INR 0.98 11/26/2015   HGBA1C 6.0 06/22/2017       Assessment & Plan:   Problem List Items Addressed This Visit     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[place]) (Chronic)    Has been doing well on current regimen.  Described the occasional chest tightness and feeling intermittently like she is not getting a good breath.  EKG - SR with no acute ischemic changes.  Discussed with her today.  Increase PPI.  Check cxr.  Refer back to cardiology for further evaluation.       Relevant Orders   CBC with Differential/Platelet (Completed)   TSH (Completed)   Basic metabolic panel (Completed)   GERD (gastroesophageal reflux disease)    On acid medication q day.  Will increased to bid.  See if symptoms resolve.        Health care maintenance    Physical today 06/22/17.  PAP 04/09/15 - negative with negative HPV.  Mammogram scheduled today.  Colonoscopy 03/19/17 - fair prep and results as outlined.        Hypercholesterolemia    On simvastatin.  Low cholesterol diet and exercise.  Follow lipid panel and liver function tests.        Relevant Orders   Hepatic function panel (Completed)   Lipid panel (Completed)   SVT (supraventricular tachycardia) (H[facility]) (Chronic)    On carvedilol.  Stable.  Followed by cardiology.         Other Visit Diagnoses    Routine general medical examination at a health care facility    -  Primary   Screening for breast cancer       Relevant Orders   MM DIGITAL SCREENING BILATERAL   Chest tightness       Relevant Orders   EKG 12-Lead (Completed)   DG Chest 2 View (Completed)   Ambulatory referral to Cardiology   Hyperglycemia       Relevant Orders   Hemoglobin A1c (Completed)       Einar Pheasant, MD

## 2017-06-24 ENCOUNTER — Encounter: Payer: Self-pay | Admitting: Internal Medicine

## 2017-06-24 DIAGNOSIS — K219 Gastro-esophageal reflux disease without esophagitis: Secondary | ICD-10-CM | POA: Insufficient documentation

## 2017-06-24 NOTE — Assessment & Plan Note (Signed)
Has been doing well on current regimen.  Described the occasional chest tightness and feeling intermittently like she is not getting a good breath.  EKG - SR with no acute ischemic changes.  Discussed with her today.  Increase PPI.  Check cxr.  Refer back to cardiology for further evaluation.

## 2017-06-24 NOTE — Assessment & Plan Note (Signed)
On acid medication q day.  Will increased to bid.  See if symptoms resolve.

## 2017-06-24 NOTE — Assessment & Plan Note (Signed)
On simvastatin.  Low cholesterol diet and exercise.  Follow lipid panel and liver function tests.   

## 2017-06-24 NOTE — Assessment & Plan Note (Signed)
On carvedilol.  Stable.  Followed by cardiology.  

## 2017-06-25 LAB — T3, FREE: T3, Free: 3.9 pg/mL (ref 2.3–4.2)

## 2017-06-25 LAB — T4, FREE: Free T4: 0.88 ng/dL (ref 0.60–1.60)

## 2017-06-26 ENCOUNTER — Other Ambulatory Visit: Payer: Self-pay | Admitting: Internal Medicine

## 2017-06-26 DIAGNOSIS — R7989 Other specified abnormal findings of blood chemistry: Secondary | ICD-10-CM

## 2017-06-26 NOTE — Progress Notes (Signed)
Order placed for f/u thyroid function tests.  

## 2017-07-15 ENCOUNTER — Other Ambulatory Visit: Payer: Self-pay | Admitting: Internal Medicine

## 2017-07-23 ENCOUNTER — Ambulatory Visit
Admission: RE | Admit: 2017-07-23 | Discharge: 2017-07-23 | Disposition: A | Discharge status: Home / Self Care | Payer: 59 | Source: Ambulatory Visit | Attending: Internal Medicine | Admitting: Internal Medicine

## 2017-07-23 DIAGNOSIS — Z1231 Encounter for screening mammogram for malignant neoplasm of breast: Secondary | ICD-10-CM | POA: Diagnosis present

## 2017-07-23 DIAGNOSIS — Z1239 Encounter for other screening for malignant neoplasm of breast: Secondary | ICD-10-CM

## 2017-07-26 ENCOUNTER — Telehealth (HOSPITAL_COMMUNITY): Payer: Self-pay | Admitting: *Deleted

## 2017-07-26 ENCOUNTER — Other Ambulatory Visit: Payer: Self-pay | Admitting: Internal Medicine

## 2017-07-26 DIAGNOSIS — R0602 Shortness of breath: Secondary | ICD-10-CM

## 2017-07-26 DIAGNOSIS — R059 Cough, unspecified: Secondary | ICD-10-CM

## 2017-07-26 DIAGNOSIS — R05 Cough: Secondary | ICD-10-CM

## 2017-07-26 NOTE — Progress Notes (Signed)
Order placed for pulmonary referral.  

## 2017-07-26 NOTE — Telephone Encounter (Signed)
Pt has just now scheduled her sleep study w/[facility] through [facility], pt is sch for 08/03/17

## 2017-08-03 ENCOUNTER — Ambulatory Visit: Payer: 59 | Admitting: Internal Medicine

## 2017-08-03 ENCOUNTER — Encounter: Payer: Self-pay | Admitting: Internal Medicine

## 2017-08-03 VITALS — BP 126/90 | HR 83 | Ht 62.0 in | Wt 170.0 lb

## 2017-08-03 DIAGNOSIS — J42 Unspecified chronic bronchitis: Secondary | ICD-10-CM

## 2017-08-03 MED ORDER — TIOTROPIUM BROMIDE MONOHYDRATE 2.5 MCG/ACT IN AERS: 2.0000 | INHALATION_SPRAY | Freq: Every day | RESPIRATORY_TRACT | 5 refills | Status: DC

## 2017-08-03 MED ORDER — BENZONATATE 200 MG PO CAPS: 200.0000 mg | ORAL_CAPSULE | Freq: Three times a day (TID) | ORAL | 1 refills | Status: DC | PRN

## 2017-08-03 NOTE — Patient Instructions (Signed)
--  Start spiriva inhaler once daily.  --Start prescribed cough medicine as often as 3 times per day.  --Use flonase every day.  --Use antihistamine daily.

## 2017-08-03 NOTE — Progress Notes (Signed)
[facility] Consultation      Assessment and Plan:  66 yo female with chronic cough likely associated with chronic bronchitis.   Chronic bronchitis with chronic cough.. -Symptoms of cough, expectoration and nasal drainage appear to be related to chronic bronchitis.  Chest x-ray also suggestive of chronic bronchitis, though there is also some suggestion of possible interstitial lung disease. -We will treat empirically with Spiriva Respimat inhaler, Tessalon.  If not improved will consider CT high-resolution.  GERD with hiatal hernia. - Discussed that reflux/GERD can contribute to cough. -Asked her to continue taking Protonix twice daily, and practice dietary discretion.  Chronic rhinitis. -Discussed that nasal drainage can contribute to cough. - Asked to take Flonase 2 sprays in each nostril once daily.  Excessive daytime sleepiness. - The patient has a history of atrial fibrillation, she has been sent for a sleep study by her cardiologist.  Will await results.  Meds ordered this encounter  Medications  . Tiotropium Bromide Monohydrate (SPIRIVA RESPIMAT) 2.5 MCG/ACT AERS    Sig: Inhale 2 puffs into the lungs daily.    Dispense:  1 Inhaler    Refill:  5  . benzonatate (TESSALON) 200 MG capsule    Sig: Take 1 capsule (200 mg total) by mouth 3 (three) times daily as needed for cough.    Dispense:  90 capsule    Refill:  1   Orders Placed This Encounter  Procedures  . Pulmonary Function Test [facility] Only     Date: 08/03/2017  MRN# [id number] EULAH WALKUP [birth year]    Kristi Greer is a 66 y.o. old female seen in consultation for chief complaint of:    Chief Complaint  Patient presents with  . Advice Only    ref by Dr. Nicki Reaper: for occasional sob with chest heaviness; cough worse at night, prod at times w/yellow, dark blood tinged mucus    HPI:  She has been having some cough, particularly when she lays down at night, it is present during the  day. It has been present for several years. It has been getting worse. It always feels like there is something in her throat.  She has never taken anything for the cough.  She has nasal drainage, she takes allegra and flonase on most nights.  She has no pets. She never smoked, her dad smoked.  She does have reflux and takes protonix 40 mg twice per day, she had a hiatal hernia. She has seen a gastro and had had an EGD.  She has occasional trouble breathing, she thinks she has slowed down but she is prevented from ADL's. She used to be more active with exercise, and she wants to get back into it.  She works in [employer] and she sits most of the day.   Imaging personally reviewed, chest x-ray 06/22/17; bilateral mild chronic bronchitic and questionable interstitial changes bilaterally.  CBC 05/23/16; absolute eosinophil count equals 300   PMHX:   Past Medical History:  Diagnosis Date  . Allergy   . Cancer ([place])    basal cell  . Cardiomyopathy, nonischemic ([place])   . Carpal tunnel syndrome    bilateral  . Congestive heart failure ([place])   . Dyspnea    on exertion  . Dysrhythmia    SVT  . Female cystocele   . Hyperlipidemia   . Hypertension   . Migraines   . Overactive detrusor   . Personal history of colonic polyps   . PONV (postoperative nausea and  vomiting)   . SUI (stress urinary incontinence, female)   . Uterovaginal prolapse   . Vaginal atrophy    Surgical Hx:  Past Surgical History:  Procedure Laterality Date  . ANTERIOR AND POSTERIOR VAGINAL REPAIR W/ SACROSPINOUS LIGAMENT SUSPENSION  10/18/2012  . CARDIAC CATHETERIZATION N/A 11/26/2015   Procedure: Right/Left Heart Cath and Coronary Angiography;  Surgeon: Jolaine Artist, MD;  Location: [facility];  Service: Cardiovascular;  Laterality: N/A;  . CHOLECYSTECTOMY  2006  . COLONOSCOPY WITH PROPOFOL N/A 03/19/2017   Procedure: COLONOSCOPY WITH PROPOFOL;  Surgeon: Lollie Sails, MD;  Location: [facility] ENDOSCOPY;   Service: Endoscopy;  Laterality: N/A;  . COLPOPEXY  10/18/2012  . CYSTOURETHROSCOPY  10/18/2012  . ESOPHAGOGASTRODUODENOSCOPY (EGD) WITH PROPOFOL N/A 10/03/2016   Procedure: ESOPHAGOGASTRODUODENOSCOPY (EGD) WITH PROPOFOL;  Surgeon: Lollie Sails, MD;  Location: [facility] ENDOSCOPY;  Service: Endoscopy;  Laterality: N/A;  . EYE SURGERY     cataract extration  . FINGER SURGERY    . HYSTEROSCOPY  2011, 10/2010  . TONSILLECTOMY    . TUBAL LIGATION    . VAGINAL HYSTERECTOMY     midurethral sling   Family Hx:  Family History  Problem Relation Age of Onset  . Hypertension Mother   . Stroke Mother   . Diabetes Mother   . Hypercholesterolemia Father   . Heart disease Father        MI  . Hypertension Father   . Stroke Father   . Breast cancer Neg Hx   . Colon cancer Neg Hx    Social Hx:   Social History   Tobacco Use  . Smoking status: Never Smoker  . Smokeless tobacco: Never Used  Substance Use Topics  . Alcohol use: Yes    Alcohol/week: 0.0 oz    Comment: Socially  . Drug use: No   Medication:    Current Outpatient Medications:  .  aspirin 81 MG tablet, Take 81 mg by mouth daily., Disp: , Rfl:  .  calcium carbonate (OSCAL) 1500 (600 Ca) MG TABS tablet, Take by mouth daily. , Disp: , Rfl:  .  carvedilol (COREG) 12.5 MG tablet, TAKE 1 TABLET (12.5 MG TOTAL) BY MOUTH 2 (TWO) TIMES DAILY WITH A MEAL., Disp: 180 tablet, Rfl: 3 .  Cholecalciferol 1000 units capsule, Take 1,000 Units by mouth daily., Disp: , Rfl:  .  diphenhydramine-acetaminophen (TYLENOL PM) 25-500 MG TABS tablet, Take 1 tablet by mouth at bedtime as needed., Disp: , Rfl:  .  fexofenadine (ALLEGRA) 180 MG tablet, Take 180 mg by mouth at bedtime. , Disp: , Rfl:  .  furosemide (LASIX) 20 MG tablet, TAKE 1 TABLET (20 MG TOTAL) BY MOUTH EVERY OTHER DAY., Disp: 90 tablet, Rfl: 1 .  lisinopril (PRINIVIL,ZESTRIL) 5 MG tablet, Take 1 tablet (5 mg total) by mouth at bedtime. Reported on 11/11/2015, Disp: 90 tablet, Rfl: 3 .   pantoprazole (PROTONIX) 40 MG tablet, Take 1 tablet (40 mg total) by mouth 2 (two) times daily before a meal. (Patient taking differently: Take 40 mg by mouth daily. ), Disp: 60 tablet, Rfl: 1 .  simvastatin (ZOCOR) 10 MG tablet, TAKE 1 TABLET (10 MG TOTAL) BY MOUTH DAILY AT 6 PM., Disp: 30 tablet, Rfl: 11 .  spironolactone (ALDACTONE) 25 MG tablet, TAKE 0.5 TABLETS (12.5 MG TOTAL) BY MOUTH DAILY., Disp: 45 tablet, Rfl: 3 .  SUMAtriptan (IMITREX) 100 MG tablet, TAKE 1 TABLET BY MOUTH EVERY DAY AS NEEDED FOR MIGRAINE. MAY REPEAT IN 2 HOURS  IF HEADACHE, Disp: 10 tablet, Rfl: 0 .  zonisamide (ZONEGRAN) 100 MG capsule, Take 1 capsule (100 mg total) by mouth daily., Disp: 90 capsule, Rfl: 1   Allergies:  Patient has no known allergies.  Review of Systems: Gen:  Denies  fever, sweats, chills HEENT: Denies blurred vision, double vision. bleeds, sore throat Cvc:  No dizziness, chest pain. Resp:   Denies cough or sputum production, shortness of breath Gi: Denies swallowing difficulty, stomach pain. Gu:  Denies bladder incontinence, burning urine Ext:   No Joint pain, stiffness. Skin: No skin rash,  hives  Endoc:  No polyuria, polydipsia. Psych: No depression, insomnia. Other:  All other systems were reviewed with the patient and were negative other that what is mentioned in the HPI.   Physical Examination:   VS: BP 126/90 (BP Location: Left Arm, Cuff Size: Normal)   Pulse 83   Ht 5\' 2"  (1.575 m)   Wt 170 lb (77.1 kg)   LMP 10/18/2012   SpO2 98%   BMI 31.09 kg/m   General Appearance: No distress  Neuro:without focal findings,  speech normal,  HEENT: PERRLA, EOM intact.   Pulmonary: normal breath sounds, No wheezing.  CardiovascularNormal S1,S2.  No m/r/g.   Abdomen: Benign, Soft, non-tender. Renal:  No costovertebral tenderness  GU:  No performed at this time. Endoc: No evident thyromegaly, no signs of acromegaly. Skin:   warm, no rashes, no ecchymosis  Extremities: normal, no  cyanosis, clubbing.  Other findings:    LABORATORY PANEL:   CBC No results for input(s): WBC, HGB, HCT, PLT in the last 168 hours. ------------------------------------------------------------------------------------------------------------------  Chemistries  No results for input(s): NA, K, CL, CO2, GLUCOSE, BUN, CREATININE, CALCIUM, MG, AST, ALT, ALKPHOS, BILITOT in the last 168 hours.  Invalid input(s): GFRCGP ------------------------------------------------------------------------------------------------------------------  Cardiac Enzymes No results for input(s): TROPONINI in the last 168 hours. ------------------------------------------------------------  RADIOLOGY:  No results found.     Thank  you for the consultation and for allowing [facility] to assist in the care of your patient. Our recommendations are noted above.  Please contact us if we can be of further service.   Marda Stalker, MD.  Board Certified in Internal Medicine, Pulmonary Medicine, [place], and Sleep Medicine.  [facility] Office Number: [phone]  Patricia Pesa, M.D.  Merton Border, M.D  08/03/2017

## 2017-08-08 ENCOUNTER — Telehealth (HOSPITAL_COMMUNITY): Payer: Self-pay | Admitting: Cardiology

## 2017-08-08 NOTE — Telephone Encounter (Signed)
Current insurance [employer] denied prior auth for sleep study, [facility] does not do appeals per Caraway B. CMA  At the request of the patient she will wait until after the first of the year for sleep study as she will have new insurance and medicare will begin shortly after that as well.    Noted

## 2017-08-10 ENCOUNTER — Other Ambulatory Visit (INDEPENDENT_AMBULATORY_CARE_PROVIDER_SITE_OTHER): Payer: 59

## 2017-08-10 DIAGNOSIS — R7989 Other specified abnormal findings of blood chemistry: Secondary | ICD-10-CM

## 2017-08-10 LAB — T4, FREE: Free T4: 0.76 ng/dL (ref 0.60–1.60)

## 2017-08-10 LAB — T3, FREE: T3, Free: 3.7 pg/mL (ref 2.3–4.2)

## 2017-08-10 LAB — TSH: TSH: 0.04 u[IU]/mL — ABNORMAL LOW (ref 0.35–4.50)

## 2017-08-13 ENCOUNTER — Encounter: Payer: Self-pay | Admitting: Internal Medicine

## 2017-08-13 ENCOUNTER — Other Ambulatory Visit: Payer: Self-pay | Admitting: Internal Medicine

## 2017-08-13 NOTE — Telephone Encounter (Signed)
She sent a my chart message and was asking for refill - just to have for future.  Please call pharmacy and see if insurance will allow her to refill.

## 2017-08-13 NOTE — Telephone Encounter (Signed)
Pt just had this filled on 07/16/17 for #10. Okay to refill?

## 2017-08-23 ENCOUNTER — Ambulatory Visit: Payer: 59 | Attending: Internal Medicine

## 2017-08-23 DIAGNOSIS — J42 Unspecified chronic bronchitis: Secondary | ICD-10-CM | POA: Diagnosis not present

## 2017-08-23 MED ORDER — ALBUTEROL SULFATE (2.5 MG/3ML) 0.083% IN NEBU
2.5000 mg | INHALATION_SOLUTION | Freq: Once | RESPIRATORY_TRACT | Status: AC
  Administered 2017-08-23: 2.5 mg via RESPIRATORY_TRACT
  Filled 2017-08-23: qty 3

## 2017-09-14 NOTE — Progress Notes (Signed)
[facility] Consultation      Assessment and Plan:  67 yo female with chronic cough likely associated with chronic bronchitis.   Chronic bronchitis with chronic cough.. -Symptoms of cough, expectoration and nasal drainage appear to be related to chronic bronchitis.   -Continue symptomatic treatment of cough. -If not improving at next visit would consider ENT referral due to the sensation of the cough appears to be originating from her throat.  Questionable interstitial lung disease. -Possible interstitial changes seen on chest x-ray. -We will send for CT high-resolution, this will be for performed in approximately 3 months when she has insurance.  Patient will follow-up after that.  GERD with hiatal hernia. - Discussed that reflux/GERD can contribute to cough. -Asked her to continue taking Protonix.  Chronic rhinitis. -Discussed that nasal drainage can contribute to cough. - Asked to take Flonase 2 sprays in each nostril once daily.  Excessive daytime sleepiness. - The patient has a history of atrial fibrillation, she has been sent for a sleep study but her insurance did not cover, she is waiting for to start Medicare.    Orders Placed This Encounter  Procedures  . CT Chest High Resolution   Return in about 3 months (around 12/16/2017).   Date: 09/14/2017  MRN# [id number] Kristi Greer 08/12/1951    Kristi Greer is a 67 y.o. old female seen in consultation for chief complaint of:    No chief complaint on file.   HPI:  Last visit patient was seen for a chronic cough, particularly when she lays down at night, it is present during the day. It has been present for several years. It has been getting worse. It always feels like there is something in her throat.  Imaging showed questionable interstitial changes.  She was started on Spiriva for chronic bronchitis, and Tessalon, asked to follow-up. She feels that the cough is much better, the  sensation that she had in her throat is actually gone. She only has the cough when she lays down at night.  She spiriva and tessalon did not help so she stopped them.   She does have reflux and takes protonix 40 mg twice per day, she had a hiatal hernia. She has seen a gastro and had had an EGD.    **chest x-ray 06/22/17; bilateral mild chronic bronchitic and questionable interstitial changes bilaterally.  **CBC 05/23/16; absolute eosinophil count equals 300 **PFT 08/23/17; spirometry shows FVC of 101%, FEV1 104%, ratio 83%.  Flow volume loop is normal. TLC is 96%, RV to TLC is normal, DLCO is 88%. -Overall this test is consistent with normal pulmonary functions.   Medication:    Current Outpatient Medications:  .  aspirin 81 MG tablet, Take 81 mg by mouth daily., Disp: , Rfl:  .  benzonatate (TESSALON) 200 MG capsule, Take 1 capsule (200 mg total) by mouth 3 (three) times daily as needed for cough., Disp: 90 capsule, Rfl: 1 .  calcium carbonate (OSCAL) 1500 (600 Ca) MG TABS tablet, Take by mouth daily. , Disp: , Rfl:  .  carvedilol (COREG) 12.5 MG tablet, TAKE 1 TABLET (12.5 MG TOTAL) BY MOUTH 2 (TWO) TIMES DAILY WITH A MEAL., Disp: 180 tablet, Rfl: 3 .  Cholecalciferol 1000 units capsule, Take 1,000 Units by mouth daily., Disp: , Rfl:  .  diphenhydramine-acetaminophen (TYLENOL PM) 25-500 MG TABS tablet, Take 1 tablet by mouth at bedtime as needed., Disp: , Rfl:  .  fexofenadine (ALLEGRA) 180 MG tablet, Take 180  mg by mouth at bedtime. , Disp: , Rfl:  .  furosemide (LASIX) 20 MG tablet, TAKE 1 TABLET (20 MG TOTAL) BY MOUTH EVERY OTHER DAY., Disp: 90 tablet, Rfl: 1 .  lisinopril (PRINIVIL,ZESTRIL) 5 MG tablet, Take 1 tablet (5 mg total) by mouth at bedtime. Reported on 11/11/2015, Disp: 90 tablet, Rfl: 3 .  pantoprazole (PROTONIX) 40 MG tablet, Take 1 tablet (40 mg total) by mouth 2 (two) times daily before a meal. (Patient taking differently: Take 40 mg by mouth daily. ), Disp: 60 tablet,  Rfl: 1 .  simvastatin (ZOCOR) 10 MG tablet, TAKE 1 TABLET (10 MG TOTAL) BY MOUTH DAILY AT 6 PM., Disp: 30 tablet, Rfl: 11 .  spironolactone (ALDACTONE) 25 MG tablet, TAKE 0.5 TABLETS (12.5 MG TOTAL) BY MOUTH DAILY., Disp: 45 tablet, Rfl: 3 .  SUMAtriptan (IMITREX) 100 MG tablet, TAKE 1 TABLET BY MOUTH EVERY DAY AS NEEDED FOR MIGRAINE. MAY REPEAT IN 2 HOURS IF HEADACHE, Disp: 10 tablet, Rfl: 0 .  Tiotropium Bromide Monohydrate (SPIRIVA RESPIMAT) 2.5 MCG/ACT AERS, Inhale 2 puffs into the lungs daily., Disp: 1 Inhaler, Rfl: 5 .  zonisamide (ZONEGRAN) 100 MG capsule, Take 1 capsule (100 mg total) by mouth daily., Disp: 90 capsule, Rfl: 1   Allergies:  Patient has no known allergies.  Review of Systems: Gen:  Denies  fever, sweats, chills HEENT: Denies blurred vision, double vision. bleeds, sore throat Cvc:  No dizziness, chest pain. Resp:   Denies cough or sputum production, shortness of breath Gi: Denies swallowing difficulty, stomach pain. Gu:  Denies bladder incontinence, burning urine Ext:   No Joint pain, stiffness. Skin: No skin rash,  hives  Endoc:  No polyuria, polydipsia. Psych: No depression, insomnia. Other:  All other systems were reviewed with the patient and were negative other that what is mentioned in the HPI.   Physical Examination:   VS: BP 108/68 (Cuff Size: Normal)   Pulse 81   Resp 16   Ht 5\' 2"  (1.575 m)   Wt 172 lb (78 kg)   LMP 10/18/2012   SpO2 92%   BMI 31.46 kg/m   General Appearance: No distress  Neuro:without focal findings,  speech normal,  HEENT: PERRLA, EOM intact.   Pulmonary: normal breath sounds, No wheezing.  CardiovascularNormal S1,S2.  No m/r/g.   Abdomen: Benign, Soft, non-tender. Renal:  No costovertebral tenderness  GU:  No performed at this time. Endoc: No evident thyromegaly, no signs of acromegaly. Skin:   warm, no rashes, no ecchymosis  Extremities: normal, no cyanosis, clubbing.  Other findings:    LABORATORY PANEL:    CBC No results for input(s): WBC, HGB, HCT, PLT in the last 168 hours. ------------------------------------------------------------------------------------------------------------------  Chemistries  No results for input(s): NA, K, CL, CO2, GLUCOSE, BUN, CREATININE, CALCIUM, MG, AST, ALT, ALKPHOS, BILITOT in the last 168 hours.  Invalid input(s): GFRCGP ------------------------------------------------------------------------------------------------------------------  Cardiac Enzymes No results for input(s): TROPONINI in the last 168 hours. ------------------------------------------------------------  RADIOLOGY:  No results found.     Thank  you for the consultation and for allowing [facility] to assist in the care of your patient. Our recommendations are noted above.  Please contact us if we can be of further service.   Marda Stalker, MD.  Board Certified in Internal Medicine, Pulmonary Medicine, [place], and Sleep Medicine.  [facility] Office Number: [phone]  Patricia Pesa, M.D.  Merton Border, M.D  09/14/2017

## 2017-09-17 ENCOUNTER — Ambulatory Visit (INDEPENDENT_AMBULATORY_CARE_PROVIDER_SITE_OTHER): Payer: 59 | Admitting: Internal Medicine

## 2017-09-17 ENCOUNTER — Encounter: Payer: Self-pay | Admitting: Internal Medicine

## 2017-09-17 DIAGNOSIS — J849 Interstitial pulmonary disease, unspecified: Secondary | ICD-10-CM | POA: Diagnosis not present

## 2017-09-17 DIAGNOSIS — M7540 Impingement syndrome of unspecified shoulder: Secondary | ICD-10-CM | POA: Insufficient documentation

## 2017-09-17 NOTE — Patient Instructions (Signed)
Will check CT chest in 3 months once you have insurance.

## 2017-09-20 ENCOUNTER — Telehealth: Payer: Self-pay | Admitting: Internal Medicine

## 2017-09-20 NOTE — Telephone Encounter (Signed)
Copied from [place] [phone]. Topic: Quick Communication - See Telephone Encounter >> Sep 20, 2017 11:35 AM Robina Ade, Helene Kelp D wrote: CRM for notification. See Telephone encounter for: 09/20/17. Patient called and would like to know if her f/u visit and lab appt for next week she can change it until April due to insurance? Please call patient back, thanks.

## 2017-09-20 NOTE — Telephone Encounter (Signed)
Called pt and rescheduled appts.

## 2017-09-25 ENCOUNTER — Other Ambulatory Visit: Payer: 59

## 2017-09-27 ENCOUNTER — Ambulatory Visit: Payer: 59 | Admitting: Internal Medicine

## 2017-10-08 ENCOUNTER — Other Ambulatory Visit: Payer: Self-pay | Admitting: Internal Medicine

## 2017-10-11 NOTE — Telephone Encounter (Signed)
Last OV 06/22/2017 Next OV 12/12/2017 Last refill 04/13/2017

## 2017-10-23 ENCOUNTER — Other Ambulatory Visit (HOSPITAL_COMMUNITY): Payer: Self-pay | Admitting: Internal Medicine

## 2017-10-31 ENCOUNTER — Other Ambulatory Visit: Payer: Self-pay | Admitting: Internal Medicine

## 2017-11-15 ENCOUNTER — Telehealth: Payer: Self-pay | Admitting: Internal Medicine

## 2017-11-15 NOTE — Telephone Encounter (Signed)
Called patient to schedule CT Chest and ROV due in April. Pt stated that her breathing is fine and that she will follow up with her primary doctor.  Stated that she did feel like the CT and F/U visit was needed but that she would check with her primary care and if primary physician felt like she needed to schedule she would contact us back.  At this time, patient requested that CT Chest be cancelled and no follow up scheduled at this time per patient request.  Just FYI for physician. Rhonda J Cobb

## 2017-11-19 NOTE — Telephone Encounter (Signed)
Ok, can cancel CT chest.

## 2017-11-19 NOTE — Telephone Encounter (Signed)
CT Chest Cancelled. Physician is aware. Kristi Greer

## 2017-12-05 DIAGNOSIS — D0472 Carcinoma in situ of skin of left lower limb, including hip: Secondary | ICD-10-CM | POA: Diagnosis not present

## 2017-12-06 ENCOUNTER — Telehealth: Payer: Self-pay | Admitting: Radiology

## 2017-12-06 NOTE — Telephone Encounter (Signed)
Pt coming in Monday for labs, please place future orders. Thank you.  

## 2017-12-07 ENCOUNTER — Other Ambulatory Visit: Payer: Self-pay | Admitting: Internal Medicine

## 2017-12-07 DIAGNOSIS — I420 Dilated cardiomyopathy: Secondary | ICD-10-CM

## 2017-12-07 DIAGNOSIS — R739 Hyperglycemia, unspecified: Secondary | ICD-10-CM

## 2017-12-07 DIAGNOSIS — E7800 Pure hypercholesterolemia, unspecified: Secondary | ICD-10-CM

## 2017-12-07 DIAGNOSIS — R7989 Other specified abnormal findings of blood chemistry: Secondary | ICD-10-CM

## 2017-12-07 NOTE — Progress Notes (Signed)
Orders placed for f/u labs.  

## 2017-12-07 NOTE — Telephone Encounter (Signed)
Orders placed for labs

## 2017-12-08 ENCOUNTER — Other Ambulatory Visit: Payer: Self-pay | Admitting: Internal Medicine

## 2017-12-10 ENCOUNTER — Other Ambulatory Visit (INDEPENDENT_AMBULATORY_CARE_PROVIDER_SITE_OTHER): Payer: Medicare Other

## 2017-12-10 ENCOUNTER — Other Ambulatory Visit: Payer: Self-pay

## 2017-12-10 ENCOUNTER — Encounter (HOSPITAL_COMMUNITY): Payer: Self-pay | Admitting: Internal Medicine

## 2017-12-10 ENCOUNTER — Ambulatory Visit (HOSPITAL_COMMUNITY)
Admission: RE | Admit: 2017-12-10 | Discharge: 2017-12-10 | Disposition: A | Discharge status: Home / Self Care | Payer: Medicare Other | Source: Ambulatory Visit | Attending: Internal Medicine | Admitting: Internal Medicine

## 2017-12-10 VITALS — BP 110/62 | HR 77 | Wt 169.0 lb

## 2017-12-10 DIAGNOSIS — R739 Hyperglycemia, unspecified: Secondary | ICD-10-CM | POA: Diagnosis not present

## 2017-12-10 DIAGNOSIS — I429 Cardiomyopathy, unspecified: Secondary | ICD-10-CM | POA: Diagnosis not present

## 2017-12-10 DIAGNOSIS — I471 Supraventricular tachycardia: Secondary | ICD-10-CM

## 2017-12-10 DIAGNOSIS — R7989 Other specified abnormal findings of blood chemistry: Secondary | ICD-10-CM | POA: Diagnosis not present

## 2017-12-10 DIAGNOSIS — Z833 Family history of diabetes mellitus: Secondary | ICD-10-CM | POA: Insufficient documentation

## 2017-12-10 DIAGNOSIS — Z823 Family history of stroke: Secondary | ICD-10-CM | POA: Diagnosis not present

## 2017-12-10 DIAGNOSIS — I5022 Chronic systolic (congestive) heart failure: Secondary | ICD-10-CM | POA: Diagnosis not present

## 2017-12-10 DIAGNOSIS — E7800 Pure hypercholesterolemia, unspecified: Secondary | ICD-10-CM

## 2017-12-10 DIAGNOSIS — I110 Hypertensive heart disease with heart failure: Secondary | ICD-10-CM | POA: Insufficient documentation

## 2017-12-10 DIAGNOSIS — E669 Obesity, unspecified: Secondary | ICD-10-CM | POA: Insufficient documentation

## 2017-12-10 DIAGNOSIS — R0683 Snoring: Secondary | ICD-10-CM | POA: Insufficient documentation

## 2017-12-10 DIAGNOSIS — E785 Hyperlipidemia, unspecified: Secondary | ICD-10-CM | POA: Insufficient documentation

## 2017-12-10 DIAGNOSIS — Z79899 Other long term (current) drug therapy: Secondary | ICD-10-CM | POA: Diagnosis not present

## 2017-12-10 DIAGNOSIS — Z7982 Long term (current) use of aspirin: Secondary | ICD-10-CM | POA: Diagnosis not present

## 2017-12-10 DIAGNOSIS — I420 Dilated cardiomyopathy: Secondary | ICD-10-CM

## 2017-12-10 DIAGNOSIS — Z8249 Family history of ischemic heart disease and other diseases of the circulatory system: Secondary | ICD-10-CM | POA: Diagnosis not present

## 2017-12-10 DIAGNOSIS — G5600 Carpal tunnel syndrome, unspecified upper limb: Secondary | ICD-10-CM | POA: Diagnosis not present

## 2017-12-10 DIAGNOSIS — N3281 Overactive bladder: Secondary | ICD-10-CM | POA: Insufficient documentation

## 2017-12-10 LAB — BASIC METABOLIC PANEL
BUN: 12 mg/dL (ref 6–23)
CO2: 28 mEq/L (ref 19–32)
Calcium: 9.5 mg/dL (ref 8.4–10.5)
Chloride: 105 mEq/L (ref 96–112)
Creatinine, Ser: 1.03 mg/dL (ref 0.40–1.20)
GFR: 57.16 mL/min — AB (ref >60.00)
GLUCOSE: 101 mg/dL — AB (ref 70–99)
POTASSIUM: 4.4 meq/L (ref 3.5–5.1)
Sodium: 141 mEq/L (ref 135–145)

## 2017-12-10 LAB — HEPATIC FUNCTION PANEL
ALT: 14 U/L (ref 0–35)
AST: 16 U/L (ref 0–37)
Albumin: 4 g/dL (ref 3.5–5.2)
Alkaline Phosphatase: 105 U/L (ref 39–117)
BILIRUBIN TOTAL: 0.4 mg/dL (ref 0.2–1.2)
Bilirubin, Direct: 0.1 mg/dL (ref 0.0–0.3)
TOTAL PROTEIN: 7 g/dL (ref 6.0–8.3)

## 2017-12-10 LAB — LIPID PANEL
CHOLESTEROL: 145 mg/dL (ref 0–200)
HDL: 45.9 mg/dL (ref >39.00)
LDL CALC: 71 mg/dL (ref 0–99)
NonHDL: 98.96
TRIGLYCERIDES: 142 mg/dL (ref 0.0–149.0)
Total CHOL/HDL Ratio: 3
VLDL: 28.4 mg/dL (ref 0.0–40.0)

## 2017-12-10 LAB — T3, FREE: T3, Free: 3.8 pg/mL (ref 2.3–4.2)

## 2017-12-10 LAB — TSH: TSH: 0.01 u[IU]/mL — ABNORMAL LOW (ref 0.35–4.50)

## 2017-12-10 LAB — HEMOGLOBIN A1C: HEMOGLOBIN A1C: 5.9 % (ref 4.6–6.5)

## 2017-12-10 LAB — T4, FREE: Free T4: 0.77 ng/dL (ref 0.60–1.60)

## 2017-12-10 NOTE — Addendum Note (Signed)
Encounter addended by: Shirley Muscat, RN on: 12/10/2017 3:19 PM  Actions taken: Order list changed

## 2017-12-10 NOTE — Progress Notes (Signed)
[name], female   DOB: 07-22-1951, 67 y.o.   MRN: [id number]    Advanced Heart Failure Clinic Note   Referring Physician: Dr.Scott Primary Care: Kristi Greer Primary Cardiologist: Kristi Greer HF: Dr. Haroldine Greer   HPI: Kristi Greer is a 67 y/o woman with h/o obesity, migraines and HTN referred by Kristi Greer for further evaluation of CHF.  Started feeling short of breath in October 2016. Thought she had severe bronchitis.   In January 2017 admitted at [facility] with acute CHF and SVT at 150. (No palpitations at the time) Received adenosine but didn't break. (slowed down and ECG looked like possible atrial tach). Eventually converted spontaneously. Echo EF 35-40%. TSH was low at 0.18 but FT4 and FT3 were fine. Felt to possible have hyperthyroidism. Seen by endocrine who started tapazole and a b-blocker in house. However, had f/u with Endo and they stopped the tapazole saying her thyroid wasn't the problem  Underwent cath 11/26/15 with NICM and well compensated hemodynamics. Monitor placed to look for recurrent SVT.   Today she presents for one year HF follow up. Overall feeling very good. Recently retired from [employer] job and loving retirement. Exercising 5 days per week. No SOB or edema. Typically HR 100-120 on treadmill. Wears FitBit averages 5-9K steps per day. HR usually well controlled. However, on her FitBit summary shows very brief episodes where HR may shoot up to 140. She does not feel these episodes. No palpitations, dizziness or presyncope. Husband says she snores heavily.    Echo 4/18 EF 55-60%   Cardiac Test cMRI 03/2016 LVEF 48% RV normal. No evidence of infiltrative disease.   On 10/11/15 had Myoview EF 30% No ischemia or scar.  Event monitor 4/17: NSR one 7 beat NSVT. No SVT  Echo 01/31/16 LVEF 40-45%, Grade 1 DD, Normal RV  R/L cath 11/26/15:  Ao = 104/61 (78) LV = 107/6/14 RA = 4 RV = 28/4/8 PA = 25/8 (16) PCW = 7 Fick cardiac output/index =  4.7/2.8 PVR = 2.0 WU SVR = 1265 FA sat = 99% PA sat = 71%, 74% Assessment: 1. Normal coronary arteries 2. 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 Well compensated hemodynamics  SH: Never smoker. Rarely uses ETOH. No family H/o SCD. Multiple family members with CAD.    Past Medical History:  Diagnosis Date  . Allergy   . Cancer ([place])    basal cell  . Cardiomyopathy, nonischemic ([place])   . Carpal tunnel syndrome    bilateral  . Congestive heart failure ([place])   . Dyspnea    on exertion  . Dysrhythmia    SVT  . Female cystocele   . Hyperlipidemia   . Hypertension   . Migraines   . Overactive detrusor   . Personal history of colonic polyps   . PONV (postoperative nausea and vomiting)   . SUI (stress urinary incontinence, female)   . Uterovaginal prolapse   . Vaginal atrophy     Current Outpatient Medications  Medication Sig Dispense Refill  . aspirin 81 MG tablet Take 81 mg by mouth daily.    . calcium carbonate (OSCAL) 1500 (600 Ca) MG TABS tablet Take by mouth daily.     . carvedilol (COREG) 12.5 MG tablet TAKE 1 TABLET (12.5 MG TOTAL) BY MOUTH 2 (TWO) TIMES DAILY WITH A MEAL. 180 tablet 3  . Cholecalciferol 1000 units capsule Take 1,000 Units by mouth daily.    . diphenhydramine-acetaminophen (TYLENOL PM) 25-500 MG TABS tablet Take 1 tablet  by mouth at bedtime as needed.    . fexofenadine (ALLEGRA) 180 MG tablet Take 180 mg by mouth at bedtime.     . furosemide (LASIX) 20 MG tablet TAKE 1 TABLET (20 MG TOTAL) BY MOUTH EVERY OTHER DAY. 90 tablet 1  . lisinopril (PRINIVIL,ZESTRIL) 5 MG tablet Take 1 tablet (5 mg total) by mouth at bedtime. Reported on 11/11/2015 90 tablet 3  . pantoprazole (PROTONIX) 40 MG tablet Take 1 tablet (40 mg total) by mouth 2 (two) times daily before a meal. (Patient taking differently: Take 40 mg by mouth daily. ) 60 tablet 1  . simvastatin (ZOCOR) 10 MG tablet TAKE 1 TABLET (10 MG TOTAL) BY MOUTH DAILY AT 6 PM. 30 tablet 11  .  spironolactone (ALDACTONE) 25 MG tablet TAKE 0.5 TABLETS (12.5 MG TOTAL) BY MOUTH DAILY. 45 tablet 3  . SUMAtriptan (IMITREX) 100 MG tablet TAKE 1 TABLET BY MOUTH EVERY DAY AS NEEDED FOR MIGRAINE. MAY REPEAT IN 2 HOURS IF HEADACHE 10 tablet 1  . zonisamide (ZONEGRAN) 100 MG capsule TAKE 1 CAPSULE BY MOUTH EVERY DAY 90 capsule 1   No current facility-administered medications for this encounter.     No Known Allergies    Social History   Socioeconomic History  . Marital status: Married    Spouse name: Not on file  . Number of children: 2  . Years of education: Not on file  . Highest education level: Not on file  Occupational History  . Not on file  Social Needs  . Financial resource strain: Not on file  . Food insecurity:    Worry: Not on file    Inability: Not on file  . Transportation needs:    Medical: Not on file    Non-medical: Not on file  Tobacco Use  . Smoking status: Never Smoker  . Smokeless tobacco: Never Used  Substance and Sexual Activity  . Alcohol use: Yes    Alcohol/week: 0.0 oz    Comment: Socially  . Drug use: No  . Sexual activity: Not on file  Lifestyle  . Physical activity:    Days per week: Not on file    Minutes per session: Not on file  . Stress: Not on file  Relationships  . Social connections:    Talks on phone: Not on file    Gets together: Not on file    Attends religious service: Not on file    Active member of club or organization: Not on file    Attends meetings of clubs or organizations: Not on file    Relationship status: Not on file  . Intimate partner violence:    Fear of current or ex partner: Not on file    Emotionally abused: Not on file    Physically abused: Not on file    Forced sexual activity: Not on file  Other Topics Concern  . Not on file  Social History Narrative  . Not on file      Family History  Problem Relation Age of Onset  . Hypertension Mother   . Stroke Mother   . Diabetes Mother   .  Hypercholesterolemia Father   . Heart disease Father        MI  . Hypertension Father   . Stroke Father   . Breast cancer Neg Hx   . Colon cancer Neg Hx     Vitals:   12/10/17 1417  BP: 110/62  Pulse: 77  SpO2: 98%  Weight: 169 lb (  76.7 kg)   Wt Readings from Last 3 Encounters:  12/10/17 169 lb (76.7 kg)  09/17/17 172 lb (78 kg)  08/03/17 170 lb (77.1 kg)   PHYSICAL EXAM: General:  Well appearing. No resp difficulty HEENT: normal Neck: supple. no JVD. Carotids 2+ bilat; no bruits. No lymphadenopathy or thryomegaly appreciated. Cor: PMI nondisplaced. Regular rate & rhythm. No rubs, gallops or murmurs. Lungs: clear Abdomen: soft, nontender, nondistended. No hepatosplenomegaly. No bruits or masses. Good bowel sounds. Extremities: no cyanosis, clubbing, rash, edema Neuro: alert & orientedx3, cranial nerves grossly intact. moves all 4 extremities w/o difficulty. Affect pleasant   ASSESSMENT & PLAN: 1. Chronic systolic HF - EF 79-48% echo 1/17. Cath 3/17 EF 30-35% normal cors - Echo 01/2016 EF 40-45%, Grade 1 DD. Suspect she has a viral vs tachy-induced CM.  - CMRI  03/2016. EF 48% - Echo 4/18 EF 55-60% - NYHA I  - Volume status looks good. Can take lasix as needed  - Continue spironolactone 12.5 mg daily.  - Continue Coreg 12.5 mg BID.  - Continue lisinopril 5 mg daily. - Labs look good today K 4.4   2.h/o SVT - having brief episodes of tachycardia on FitBit. Unclear if these are real or artifacutal - Will place 30-day event monitor  3. Snoring  - Order sleeps study  Follow-up in 2-3 months with echo. Can stop ASA.   Benay Spice 2:48 PM

## 2017-12-10 NOTE — Patient Instructions (Signed)
Stop Asprin  Your physician has recommended that you wear a holter monitor. Holter monitors are medical devices that record the heart's electrical activity. Doctors most often use these monitors to diagnose arrhythmias. Arrhythmias are problems with the speed or rhythm of the heartbeat. The monitor is a small, portable device. You can wear one while you do your normal daily activities. This is usually used to diagnose what is causing palpitations/syncope (passing out).   Your physician has recommended that you have a sleep study. This test records several body functions during sleep, including: brain activity, eye movement, oxygen and carbon dioxide blood levels, heart rate and rhythm, breathing rate and rhythm, the flow of air through your mouth and nose, snoring, body muscle movements, and chest and belly movement.   Your physician has requested that you have an echocardiogram. Echocardiography is a painless test that uses sound waves to create images of your heart. It provides your doctor with information about the size and shape of your heart and how well your heart's chambers and valves are working. This procedure takes approximately one hour. There are no restrictions for this procedure.  Your physician recommends that you schedule a follow-up appointment in: 3 months with Dr. Haroldine Laws and  an a echocardiogram Please Call an Schedule Appointment  (call in May, 2019)

## 2017-12-10 NOTE — Addendum Note (Signed)
Encounter addended by: Shirley Muscat, RN on: 12/10/2017 3:14 PM  Actions taken: Order list changed, Diagnosis association updated, Sign clinical note

## 2017-12-11 ENCOUNTER — Other Ambulatory Visit (HOSPITAL_COMMUNITY): Payer: Self-pay

## 2017-12-11 ENCOUNTER — Encounter: Payer: Self-pay | Admitting: Internal Medicine

## 2017-12-11 DIAGNOSIS — I471 Supraventricular tachycardia: Secondary | ICD-10-CM

## 2017-12-12 ENCOUNTER — Ambulatory Visit (INDEPENDENT_AMBULATORY_CARE_PROVIDER_SITE_OTHER): Payer: Medicare Other | Admitting: Internal Medicine

## 2017-12-12 ENCOUNTER — Other Ambulatory Visit (HOSPITAL_COMMUNITY): Payer: Self-pay

## 2017-12-12 ENCOUNTER — Encounter: Payer: Self-pay | Admitting: Internal Medicine

## 2017-12-12 VITALS — BP 104/70 | HR 69 | Temp 97.7°F | Resp 16 | Wt 171.0 lb

## 2017-12-12 DIAGNOSIS — R739 Hyperglycemia, unspecified: Secondary | ICD-10-CM | POA: Diagnosis not present

## 2017-12-12 DIAGNOSIS — J42 Unspecified chronic bronchitis: Secondary | ICD-10-CM | POA: Diagnosis not present

## 2017-12-12 DIAGNOSIS — I5022 Chronic systolic (congestive) heart failure: Secondary | ICD-10-CM

## 2017-12-12 DIAGNOSIS — K219 Gastro-esophageal reflux disease without esophagitis: Secondary | ICD-10-CM

## 2017-12-12 DIAGNOSIS — I471 Supraventricular tachycardia: Secondary | ICD-10-CM | POA: Diagnosis not present

## 2017-12-12 DIAGNOSIS — R7989 Other specified abnormal findings of blood chemistry: Secondary | ICD-10-CM | POA: Diagnosis not present

## 2017-12-12 DIAGNOSIS — I420 Dilated cardiomyopathy: Secondary | ICD-10-CM

## 2017-12-12 DIAGNOSIS — I472 Ventricular tachycardia: Secondary | ICD-10-CM | POA: Diagnosis not present

## 2017-12-12 DIAGNOSIS — E7800 Pure hypercholesterolemia, unspecified: Secondary | ICD-10-CM | POA: Diagnosis not present

## 2017-12-12 DIAGNOSIS — I4729 Other ventricular tachycardia: Secondary | ICD-10-CM

## 2017-12-12 NOTE — Progress Notes (Signed)
[name], female   DOB: 1951/01/21, 67 y.o.   MRN: [id number]   Subjective:    [name], female    DOB: Dec 08, 1950, 67 y.o.   MRN: [id number]  HPI  Patient here for a scheduled follow up.  She retired in 07/2017.  Feeling better.  Stress is better.  She is exercising regularly.  Going to [employer] three days per week and walking other days.  She saw cardiology 12/10/17.   Note reviewed.  Noticed increased heart rate on her FitBit.  Unclear if artifactual  Planning for 30 day event monitor (12/26/17), sleep study (12/31/17) and echo. Overall she feels better.  No chest pain.  Saw pulmonary 08/2017 for cough.  Cough is better.  Breathing stable.  No acid reflux. no abdominal pain.  Bowels moving.       Past Medical History:  Diagnosis Date  . Allergy   . Cancer ([place])    basal cell  . Cardiomyopathy, nonischemic ([place])   . Carpal tunnel syndrome    bilateral  . Congestive heart failure ([place])   . Dyspnea    on exertion  . Dysrhythmia    SVT  . Female cystocele   . Hyperlipidemia   . Hypertension   . Migraines   . Overactive detrusor   . Personal history of colonic polyps   . PONV (postoperative nausea and vomiting)   . SUI (stress urinary incontinence, female)   . Uterovaginal prolapse   . Vaginal atrophy    Past Surgical History:  Procedure Laterality Date  . ANTERIOR AND POSTERIOR VAGINAL REPAIR W/ SACROSPINOUS LIGAMENT SUSPENSION  10/18/2012  . CARDIAC CATHETERIZATION N/A 11/26/2015   Procedure: Right/Left Heart Cath and Coronary Angiography;  Surgeon: Jolaine Artist, MD;  Location: [facility];  Service: Cardiovascular;  Laterality: N/A;  . CHOLECYSTECTOMY  2006  . COLONOSCOPY WITH PROPOFOL N/A 03/19/2017   Procedure: COLONOSCOPY WITH PROPOFOL;  Surgeon: Lollie Sails, MD;  [facility] END[facility]COPY;  Service: Endoscopy;  Laterality: N/A;  . COLPOPEXY  10/18/2012  . CYSTOURETHROSCOPY  10/18/2012  .  ESOPHAGOGASTRODUODENOSCOPY (EGD) WITH PROPOFOL N/A 10/03/2016   Procedure: ESOPHAGOGASTRODUODENOSCOPY (EGD) WITH PROPOFOL;  Surgeon: Lollie Sails, MD;  Location: [facility] ENDOSCOPY;  Service: Endoscopy;  Laterality: N/A;  . EYE SURGERY     cataract extration  . FINGER SURGERY    . HYSTEROSCOPY  2011, 10/2010  . TONSILLECTOMY    . TUBAL LIGATION    . VAGINAL HYSTERECTOMY     midurethral sling   Family History  Problem Relation Age of Onset  . Hypertension Mother   . Stroke Mother   . Diabetes Mother   . Hypercholesterolemia Father   . Heart disease Father        MI  . Hypertension Father   . Stroke Father   . Breast cancer Neg Hx   . Colon cancer Neg Hx    Social History   Socioeconomic History  . Marital status: Married    Spouse name: Not on file  . Number of children: 2  . Years of education: Not on file  . Highest education level: Not on file  Occupational History  . Not on file  Social Needs  . Financial resource strain: Not on file  . Food insecurity:    Worry: Not on file    Inability: Not on file  . Transportation needs:    Medical: Not on file    Non-medical: Not on  file  Tobacco Use  . Smoking status: Never Smoker  . Smokeless tobacco: Never Used  Substance and Sexual Activity  . Alcohol use: Yes    Alcohol/week: 0.0 oz    Comment: Socially  . Drug use: No  . Sexual activity: Not on file  Lifestyle  . Physical activity:    Days per week: Not on file    Minutes per session: Not on file  . Stress: Not on file  Relationships  . Social connections:    Talks on phone: Not on file    Gets together: Not on file    Attends religious service: Not on file    Active member of club or organization: Not on file    Attends meetings of clubs or organizations: Not on file    Relationship status: Not on file  Other Topics Concern  . Not on file  Social History Narrative  . Not on file    Outpatient Encounter Medications as of 12/12/2017  Medication Sig  .  calcium carbonate (OSCAL) 1500 (600 Ca) MG TABS tablet Take by mouth daily.   . carvedilol (COREG) 12.5 MG tablet TAKE 1 TABLET (12.5 MG TOTAL) BY MOUTH 2 (TWO) TIMES DAILY WITH A MEAL.  Marland Kitchen Cholecalciferol 1000 units capsule Take 1,000 Units by mouth daily.  . diphenhydramine-acetaminophen (TYLENOL PM) 25-500 MG TABS tablet Take 1 tablet by mouth at bedtime as needed.  . fexofenadine (ALLEGRA) 180 MG tablet Take 180 mg by mouth at bedtime.   . furosemide (LASIX) 20 MG tablet TAKE 1 TABLET (20 MG TOTAL) BY MOUTH EVERY OTHER DAY.  Marland Kitchen lisinopril (PRINIVIL,ZESTRIL) 5 MG tablet Take 1 tablet (5 mg total) by mouth at bedtime. Reported on 11/11/2015  . pantoprazole (PROTONIX) 40 MG tablet Take 1 tablet (40 mg total) by mouth 2 (two) times daily before a meal. (Patient taking differently: Take 40 mg by mouth daily. )  . simvastatin (ZOCOR) 10 MG tablet TAKE 1 TABLET (10 MG TOTAL) BY MOUTH DAILY AT 6 PM.  . spironolactone (ALDACTONE) 25 MG tablet TAKE 0.5 TABLETS (12.5 MG TOTAL) BY MOUTH DAILY.  . SUMAtriptan (IMITREX) 100 MG tablet TAKE 1 TABLET BY MOUTH EVERY DAY AS NEEDED FOR MIGRAINE. MAY REPEAT IN 2 HOURS IF HEADACHE  . zonisamide (ZONEGRAN) 100 MG capsule TAKE 1 CAPSULE BY MOUTH EVERY DAY   No facility-administered encounter medications on file as of 12/12/2017.     Review of Systems  Constitutional: Negative for appetite change and unexpected weight change.  HENT: Negative for congestion and sinus pressure.   Respiratory: Negative for cough, chest tightness and shortness of breath.   Cardiovascular: Negative for chest pain, palpitations and leg swelling.  Gastrointestinal: Negative for abdominal pain, diarrhea, nausea and vomiting.  Genitourinary: Negative for difficulty urinating and dysuria.  Musculoskeletal: Negative for joint swelling and myalgias.  Skin: Negative for color change and rash.  Neurological: Negative for dizziness, light-headedness and headaches.  Psychiatric/Behavioral:  Negative for agitation and dysphoric mood.       Objective:     Blood pressure rechecked by me:  112/68  Physical Exam  Constitutional: She appears well-developed and well-nourished. No distress.  HENT:  Nose: Nose normal.  Mouth/Throat: Oropharynx is clear and moist.  Neck: Neck supple. No thyromegaly present.  Cardiovascular: Normal rate and regular rhythm.  Pulmonary/Chest: Breath sounds normal. No respiratory distress. She has no wheezes.  Abdominal: Soft. Bowel sounds are normal. There is no tenderness.  Musculoskeletal: She exhibits no edema or tenderness.  Lymphadenopathy:    She has no cervical adenopathy.  Skin: No rash noted. No erythema.  Psychiatric: She has a normal mood and affect. Her behavior is normal.    BP 104/70 (BP Location: Left Arm, Patient Position: Sitting, Cuff Size: Normal)   Pulse 69   Temp 97.7 F (36.5 C) (Oral)   Resp 16   Wt 171 lb (77.6 kg)   LMP 10/18/2012   SpO2 96%   BMI 31.28 kg/m  Wt Readings from Last 3 Encounters:  12/12/17 171 lb (77.6 kg)  12/10/17 169 lb (76.7 kg)  09/17/17 172 lb (78 kg)     Lab Results  Component Value Date   WBC 9.1 06/22/2017   HGB 13.5 06/22/2017   HCT 42.7 06/22/2017   PLT 242.0 06/22/2017   GLUCOSE 101 (H) 12/10/2017   CHOL 145 12/10/2017   TRIG 142.0 12/10/2017   HDL 45.90 12/10/2017   LDLCALC 71 12/10/2017   ALT 14 12/10/2017   AST 16 12/10/2017   NA 141 12/10/2017   K 4.4 12/10/2017   CL 105 12/10/2017   CREATININE 1.03 12/10/2017   BUN 12 12/10/2017   CO2 28 12/10/2017   TSH 0.01 (L) 12/10/2017   INR 0.98 11/26/2015   HGBA1C 5.9 12/10/2017       Assessment & Plan:   Problem List Items Addressed This Visit    Chronic bronchitis ([place])    Just saw pulmonary.  Note reviewed.  No increased cough now.  Follow.        Chronic systolic heart failure (HCC) (Chronic)    Followed by cardiology.  Currently doing well on current regimen.  Planning for f/u echo soon.       Congestive  dilated cardiomyopathy (HCC) (Chronic)    Followed by cardiology.  Doing well on current regimen.  Planning for f/u echo soon.       Relevant Orders   Basic metabolic panel   GERD (gastroesophageal reflux disease)    Controlled on current medication.  Follow.        Hypercholesterolemia    On simvastatin.  Low cholesterol diet and exercise.  Follow lipid panel and liver function tests.        Relevant Orders   Hepatic function panel   Lipid panel   NSVT (nonsustained ventricular tachycardia) (HCC) (Chronic)    On carvedilol.  Doing well.  Planning for 30 day event monitor.        SVT (supraventricular tachycardia) (HCC) (Chronic)    On carvedilol.  Just saw cardiology.  Some increased heart rate noted on her FitBit.  Planning for 30 day event monitor.        Relevant Orders   TSH   T4, free    Other Visit Diagnoses    Low TSH level    -  Primary   Relevant Orders   T3, free   Hyperglycemia       Relevant Orders   Hemoglobin A1c       Einar Pheasant, MD

## 2017-12-15 ENCOUNTER — Encounter: Payer: Self-pay | Admitting: Internal Medicine

## 2017-12-15 DIAGNOSIS — J42 Unspecified chronic bronchitis: Secondary | ICD-10-CM | POA: Insufficient documentation

## 2017-12-15 NOTE — Assessment & Plan Note (Signed)
Just saw pulmonary.  Note reviewed.  No increased cough now.  Follow.

## 2017-12-15 NOTE — Assessment & Plan Note (Signed)
On simvastatin.  Low cholesterol diet and exercise.  Follow lipid panel and liver function tests.   

## 2017-12-15 NOTE — Assessment & Plan Note (Signed)
On carvedilol.  Just saw cardiology.  Some increased heart rate noted on her FitBit.  Planning for 30 day event monitor.

## 2017-12-15 NOTE — Assessment & Plan Note (Signed)
On carvedilol.  Doing well.  Planning for 30 day event monitor.

## 2017-12-15 NOTE — Assessment & Plan Note (Signed)
Followed by cardiology.  Doing well on current regimen.  Planning for f/u echo soon.

## 2017-12-15 NOTE — Assessment & Plan Note (Signed)
Controlled on current medication.  Follow.   

## 2017-12-15 NOTE — Assessment & Plan Note (Signed)
Followed by cardiology.  Currently doing well on current regimen.  Planning for f/u echo soon.

## 2017-12-19 ENCOUNTER — Other Ambulatory Visit (HOSPITAL_COMMUNITY): Payer: Self-pay | Admitting: Internal Medicine

## 2017-12-21 ENCOUNTER — Encounter: Payer: Self-pay | Admitting: Internal Medicine

## 2017-12-26 ENCOUNTER — Ambulatory Visit (INDEPENDENT_AMBULATORY_CARE_PROVIDER_SITE_OTHER): Payer: Medicare Other

## 2017-12-26 ENCOUNTER — Other Ambulatory Visit (HOSPITAL_COMMUNITY): Payer: Self-pay | Admitting: Internal Medicine

## 2017-12-26 DIAGNOSIS — I471 Supraventricular tachycardia: Secondary | ICD-10-CM

## 2017-12-26 DIAGNOSIS — I5022 Chronic systolic (congestive) heart failure: Secondary | ICD-10-CM | POA: Diagnosis not present

## 2017-12-31 ENCOUNTER — Ambulatory Visit (HOSPITAL_BASED_OUTPATIENT_CLINIC_OR_DEPARTMENT_OTHER): Payer: Medicare Other | Attending: Internal Medicine | Admitting: Cardiology

## 2017-12-31 DIAGNOSIS — I509 Heart failure, unspecified: Secondary | ICD-10-CM | POA: Insufficient documentation

## 2017-12-31 DIAGNOSIS — R0683 Snoring: Secondary | ICD-10-CM | POA: Insufficient documentation

## 2017-12-31 DIAGNOSIS — R5383 Other fatigue: Secondary | ICD-10-CM | POA: Insufficient documentation

## 2018-01-06 ENCOUNTER — Encounter: Payer: Self-pay | Admitting: Internal Medicine

## 2018-01-06 DIAGNOSIS — R0683 Snoring: Secondary | ICD-10-CM | POA: Insufficient documentation

## 2018-01-06 NOTE — Procedures (Signed)
   Patient Name: Kristi Greer, Kristi Greer Study Date:08/14/2017 12/31/2017 <br> <br> Gender: Female D.O.B: 11/16/1950 Age (years): 14 Referring Provider: Shaune Pascal Bensimhon Height (inches): 61 Interpreting Physician: Fransico Him MD, ABSM Weight (lbs): 165 RPSGT: Carolin Coy BMI: 31 MRN: [id number] Neck Size: 14.00  CLINICAL INFORMATION Sleep Study Type: NPSG  Indication for sleep study: Congestive Heart Failure, Fatigue, OSA, Snoring  Epworth Sleepiness Score: 10  SLEEP STUDY TECHNIQUE As per the AASM Manual for the Scoring of Sleep and Associated Events v2.3 (April 2016) with a hypopnea requiring 4% desaturations.  The channels recorded and monitored were frontal, central and occipital EEG, electrooculogram (EOG), submentalis EMG (chin), nasal and oral airflow, thoracic and abdominal wall motion, anterior tibialis EMG, snore microphone, electrocardiogram, and pulse oximetry.  MEDICATIONS Medications self-administered by patient taken the night of the study : CALCIUM CARBONATE, COREG, FEXOFENADINE, LISINOPRIL, PANTOPRAZOLE, SIMVASTATIN, SPIRONOLACTONE, ROBITUSSIN NIGHTTIME DM, CHOLECALCIFEROL  SLEEP ARCHITECTURE The study was initiated at 10:55:01 PM and ended at 5:09:16 AM.  Sleep onset time was 5.5 minutes and the sleep efficiency was 87.8%%. The total sleep time was 328.5 minutes.  Stage REM latency was 79.5 minutes.  The patient spent 8.4%% of the night in stage N1 sleep, 79.6%% in stage N2 sleep, 0.5%% in stage N3 and 11.57% in REM.  Alpha intrusion was absent.  Supine sleep was 10.65%.  RESPIRATORY PARAMETERS The overall apnea/hypopnea index (AHI) was 3.1 per hour. There were 3 total apneas, including 3 obstructive, 0 central and 0 mixed apneas. There were 14 hypopneas and 49 RERAs.  The AHI during Stage REM sleep was 18.9 per hour.  AHI while supine was 3.4 per hour.  The mean oxygen saturation was 94.3%. The minimum SpO2 during sleep was 88.0%.  soft snoring  was noted during this study.  CARDIAC DATA The 2 lead EKG demonstrated sinus rhythm. The mean heart rate was 75.6 beats per minute. Other EKG findings include: PVCs.  LEG MOVEMENT DATA The total PLMS were 0 with a resulting PLMS index of 0.0. Associated arousal with leg movement index was 0.0 .  IMPRESSIONS - No significant obstructive sleep apnea occurred during this study (AHI = 3.1/h). - No significant central sleep apnea occurred during this study (CAI = 0.0/h). - The patient had minimal or no oxygen desaturation during the study (Min O2 = 88.0%) - The patient snored with soft snoring volume. - EKG findings include PVCs. - Clinically significant periodic limb movements did not occur during sleep. No significant associated arousals.  DIAGNOSIS - Normal study  RECOMMENDATIONS - Avoid alcohol, sedatives and other CNS depressants that may worsen sleep apnea and disrupt normal sleep architecture. - Sleep hygiene should be reviewed to assess factors that may improve sleep quality. - Weight management and regular exercise should be initiated or continued if appropriate.  [Electronically signed] 01/06/2018 12:17 PM  Fransico Him MD, ABSM Diplomate, American Board of Sleep Medicine

## 2018-01-09 ENCOUNTER — Institutional Professional Consult (permissible substitution): Payer: Medicare Other | Admitting: Internal Medicine

## 2018-01-09 DIAGNOSIS — C44729 Squamous cell carcinoma of skin of left lower limb, including hip: Secondary | ICD-10-CM | POA: Diagnosis not present

## 2018-01-10 ENCOUNTER — Other Ambulatory Visit (HOSPITAL_COMMUNITY): Payer: Self-pay | Admitting: Internal Medicine

## 2018-01-10 ENCOUNTER — Telehealth: Payer: Self-pay | Admitting: *Deleted

## 2018-01-10 NOTE — Telephone Encounter (Signed)
-----   Message from Sueanne Margarita, MD sent at 01/06/2018 12:21 PM EDT ----- Please let patient know that sleep study showed no significant sleep apnea.

## 2018-01-10 NOTE — Telephone Encounter (Signed)
Informed patient of sleep study results and patient understanding was verbalized. Pt is aware and agreeable to normal results 

## 2018-01-15 DIAGNOSIS — K219 Gastro-esophageal reflux disease without esophagitis: Secondary | ICD-10-CM | POA: Diagnosis not present

## 2018-01-15 DIAGNOSIS — R1011 Right upper quadrant pain: Secondary | ICD-10-CM | POA: Diagnosis not present

## 2018-01-19 ENCOUNTER — Other Ambulatory Visit (HOSPITAL_COMMUNITY): Payer: Self-pay | Admitting: Internal Medicine

## 2018-01-28 DIAGNOSIS — Z872 Personal history of diseases of the skin and subcutaneous tissue: Secondary | ICD-10-CM | POA: Diagnosis not present

## 2018-01-28 DIAGNOSIS — L578 Other skin changes due to chronic exposure to nonionizing radiation: Secondary | ICD-10-CM | POA: Diagnosis not present

## 2018-01-28 DIAGNOSIS — L821 Other seborrheic keratosis: Secondary | ICD-10-CM | POA: Diagnosis not present

## 2018-01-28 DIAGNOSIS — Z859 Personal history of malignant neoplasm, unspecified: Secondary | ICD-10-CM | POA: Diagnosis not present

## 2018-01-28 DIAGNOSIS — L570 Actinic keratosis: Secondary | ICD-10-CM | POA: Diagnosis not present

## 2018-01-28 DIAGNOSIS — L918 Other hypertrophic disorders of the skin: Secondary | ICD-10-CM | POA: Diagnosis not present

## 2018-01-28 DIAGNOSIS — Z86018 Personal history of other benign neoplasm: Secondary | ICD-10-CM | POA: Diagnosis not present

## 2018-01-30 DIAGNOSIS — G5602 Carpal tunnel syndrome, left upper limb: Secondary | ICD-10-CM | POA: Diagnosis not present

## 2018-02-04 ENCOUNTER — Telehealth (HOSPITAL_COMMUNITY): Payer: Self-pay | Admitting: *Deleted

## 2018-02-04 DIAGNOSIS — I472 Ventricular tachycardia: Secondary | ICD-10-CM

## 2018-02-04 DIAGNOSIS — I4729 Other ventricular tachycardia: Secondary | ICD-10-CM

## 2018-02-04 DIAGNOSIS — I5022 Chronic systolic (congestive) heart failure: Secondary | ICD-10-CM

## 2018-02-04 NOTE — Telephone Encounter (Signed)
-----   Message from Jolaine Artist, MD sent at 01/31/2018  7:44 PM EDT ----- Brief run of NSVT. Continue b-blocker. Check BMET and Mag. If palpitations continue will refer to EP.

## 2018-02-04 NOTE — Telephone Encounter (Signed)
Notes recorded by Scarlette Calico, RN on 02/04/2018 at 12:00 PM EDT Pt is aware, she is already sch to see Dr Rayann Heman on 6/12 and will make sure to keep that appt, lab orders placed to be done at his office

## 2018-02-06 ENCOUNTER — Ambulatory Visit (INDEPENDENT_AMBULATORY_CARE_PROVIDER_SITE_OTHER): Payer: Medicare Other | Admitting: Internal Medicine

## 2018-02-06 ENCOUNTER — Encounter: Payer: Self-pay | Admitting: Internal Medicine

## 2018-02-06 VITALS — BP 102/56 | HR 80 | Ht 61.0 in | Wt 165.0 lb

## 2018-02-06 DIAGNOSIS — I471 Supraventricular tachycardia: Secondary | ICD-10-CM | POA: Diagnosis not present

## 2018-02-06 DIAGNOSIS — I5022 Chronic systolic (congestive) heart failure: Secondary | ICD-10-CM

## 2018-02-06 LAB — BASIC METABOLIC PANEL
BUN/Creatinine Ratio: 19 (ref 12–28)
BUN: 16 mg/dL (ref 8–27)
CHLORIDE: 106 mmol/L (ref 96–106)
CO2: 22 mmol/L (ref 20–29)
Calcium: 9.3 mg/dL (ref 8.7–10.3)
Creatinine, Ser: 0.84 mg/dL (ref 0.57–1.00)
GFR calc non Af Amer: 73 mL/min/{1.73_m2} (ref >59)
GFR, EST AFRICAN AMERICAN: 84 mL/min/{1.73_m2} (ref >59)
GLUCOSE: 99 mg/dL (ref 65–99)
POTASSIUM: 4.8 mmol/L (ref 3.5–5.2)
Sodium: 141 mmol/L (ref 134–144)

## 2018-02-06 LAB — MAGNESIUM: MAGNESIUM: 1.9 mg/dL (ref 1.6–2.3)

## 2018-02-06 NOTE — Progress Notes (Signed)
Electrophysiology Office Note   Date:  02/06/2018   ID:  Kristi Greer 02/03/1951, MRN [id number]  PCP:  Einar Pheasant, MD  Cardiologist:  Dr Haroldine Laws Primary Electrophysiologist: Thompson Grayer, MD    CC: SVT   History of Present Illness: Kristi Greer is a 67 y.o. female who presents today for electrophysiology evaluation.   She is referred by Dr Haroldine Laws for further evaluation of tachycardia.  She initially presented to [facility] in January 2017 with CHF.  She was noted to have long RP tachycardia at 140-150 bpm while in the hospital.  She was asymptomatic at the time.  She has had normalization of her EF with medical therapy.  She is active and doing reasonably well. Upon recently seeing Dr Haroldine Laws, she reported that during exercise, her heart rates were 100-120 bpm.  She did have several documented heart rates of 140 bpm, without associated symptoms.  She had a 30 day monitor placed to evaluate for further tachycardia.  She did not have SVT.  She did have a single episode of nonsustained VT for which she was asymptomatic.  Today, she denies symptoms of palpitations, chest pain, shortness of breath, orthopnea, PND, lower extremity edema, claudication, dizziness, presyncope, syncope, bleeding, or neurologic sequela. The patient is tolerating medications without difficulties and is otherwise without complaint today.    Past Medical History:  Diagnosis Date  . Allergy   . Cancer ([place])    basal cell  . Cardiomyopathy, nonischemic ([place])   . Carpal tunnel syndrome    bilateral  . Congestive heart failure ([place])    normalization of EF with medical therapy  . Dysrhythmia 08/31/2015   long RP tachycardia  . Female cystocele   . Hyperlipidemia   . Hypertension   . Migraines   . Overactive detrusor   . Personal history of colonic polyps   . PONV (postoperative nausea and vomiting)   . SUI (stress urinary incontinence, female)   . Uterovaginal prolapse   . Vaginal  atrophy    Past Surgical History:  Procedure Laterality Date  . ANTERIOR AND POSTERIOR VAGINAL REPAIR W/ SACROSPINOUS LIGAMENT SUSPENSION  10/18/2012  . CARDIAC CATHETERIZATION N/A 11/26/2015   Procedure: Right/Left Heart Cath and Coronary Angiography;  Surgeon: Jolaine Artist, MD;  Location: [facility];  Service: Cardiovascular;  Laterality: N/A;  . CHOLECYSTECTOMY  2006  . COLONOSCOPY WITH PROPOFOL N/A 03/19/2017   Procedure: COLONOSCOPY WITH PROPOFOL;  Surgeon: Lollie Sails, MD;  Location: [facility] ENDOSCOPY;  Service: Endoscopy;  Laterality: N/A;  . COLPOPEXY  10/18/2012  . CYSTOURETHROSCOPY  10/18/2012  . ESOPHAGOGASTRODUODENOSCOPY (EGD) WITH PROPOFOL N/A 10/03/2016   Procedure: ESOPHAGOGASTRODUODENOSCOPY (EGD) WITH PROPOFOL;  Surgeon: Lollie Sails, MD;  Location: [facility] ENDOSCOPY;  Service: Endoscopy;  Laterality: N/A;  . EYE SURGERY     cataract extration  . FINGER SURGERY    . HYSTEROSCOPY  2011, 10/2010  . TONSILLECTOMY    . TUBAL LIGATION    . VAGINAL HYSTERECTOMY     midurethral sling     Current Outpatient Medications  Medication Sig Dispense Refill  . calcium carbonate (OSCAL) 1500 (600 Ca) MG TABS tablet Take by mouth daily.     . carvedilol (COREG) 12.5 MG tablet TAKE 1 TABLET (12.5 MG TOTAL) BY MOUTH 2 (TWO) TIMES DAILY WITH A MEAL. 180 tablet 3  . Cholecalciferol 1000 units capsule Take 1,000 Units by mouth daily.    . diphenhydramine-acetaminophen (TYLENOL PM) 25-500 MG TABS tablet Take 1  tablet by mouth at bedtime as needed.    . fexofenadine (ALLEGRA) 180 MG tablet Take 180 mg by mouth at bedtime.     . furosemide (LASIX) 20 MG tablet TAKE 1 TABLET (20 MG TOTAL) BY MOUTH EVERY OTHER DAY. 90 tablet 1  . lisinopril (PRINIVIL,ZESTRIL) 5 MG tablet TAKE 1 TABLET (5 MG TOTAL) BY MOUTH AT BEDTIME. REPORTED ON 11/11/2015 90 tablet 3  . pantoprazole (PROTONIX) 40 MG tablet Take 1 tablet (40 mg total) by mouth 2 (two) times daily before a meal. (Patient taking  differently: Take 40 mg by mouth daily. ) 60 tablet 1  . simvastatin (ZOCOR) 10 MG tablet TAKE 1 TABLET (10 MG TOTAL) BY MOUTH DAILY AT 6 PM. 30 tablet 11  . spironolactone (ALDACTONE) 25 MG tablet TAKE 0.5 TABLETS (12.5 MG TOTAL) BY MOUTH DAILY. 45 tablet 3  . SUMAtriptan (IMITREX) 100 MG tablet TAKE 1 TABLET BY MOUTH EVERY DAY AS NEEDED FOR MIGRAINE. MAY REPEAT IN 2 HOURS IF HEADACHE 10 tablet 1  . zonisamide (ZONEGRAN) 100 MG capsule TAKE 1 CAPSULE BY MOUTH EVERY DAY 90 capsule 1   No current facility-administered medications for this visit.     Allergies:   Patient has no known allergies.   Social History:  The patient  reports that she has never smoked. She has never used smokeless tobacco. She reports that she drinks alcohol. She reports that she does not use drugs.   Family History:  The patient's family history includes Diabetes in her mother; Heart disease in her father; Hypercholesterolemia in her father; Hypertension in her father and mother; Stroke in her father and mother.    ROS:  Please see the history of present illness.   All other systems are personally reviewed and negative.    PHYSICAL EXAM: VS:  BP (!) 102/56   Pulse 80   Ht 5\' 1"  (1.549 m)   Wt 165 lb (74.8 kg)   LMP 10/18/2012   BMI 31.18 kg/m  , BMI Body mass index is 31.18 kg/m. GEN: Well nourished, well developed, in no acute distress  HEENT: normal  Neck: no JVD, carotid bruits, or masses Cardiac: RRR; no murmurs, rubs, or gallops,no edema  Respiratory:  clear to auscultation bilaterally, normal work of breathing GI: soft, nontender, nondistended, + BS MS: no deformity or atrophy  Skin: warm and dry  Neuro:  Strength and sensation are intact Psych: euthymic mood, full affect  EKG:  EKG is ordered today. The ekg ordered today is personally reviewed and shows sinus rhythm 80 bpm, PR 188 msec, QRS 68 msec, QTc 435 msec   Recent Labs: 06/22/2017: Hemoglobin 13.5; Platelets 242.0 12/10/2017: ALT 14;  BUN 12; Creatinine, Ser 1.03; Potassium 4.4; Sodium 141; TSH 0.01  personally reviewed   Lipid Panel     Component Value Date/Time   CHOL 145 12/10/2017 0804   TRIG 142.0 12/10/2017 0804   HDL 45.90 12/10/2017 0804   CHOLHDL 3 12/10/2017 0804   VLDL 28.4 12/10/2017 0804   LDLCALC 71 12/10/2017 0804   personally reviewed   Wt Readings from Last 3 Encounters:  02/06/18 165 lb (74.8 kg)  12/31/17 165 lb (74.8 kg)  12/12/17 171 lb (77.6 kg)      Other studies personally reviewed: Additional studies/ records that were reviewed today include: CHF clinic notes, prior echo, event monitor, ekgs from 2017  Review of the above records today demonstrates: as above   ASSESSMENT AND PLAN:  1.  Tachycardia She had long  RP tachycardia documented 08/31/2015 (EKGs reviewed).  She has not had subsequent symptomatic arrhythmias.  Recent event monitor did not document SVT.  She did have NSVT for which she was asymptomatic as well. Given preserved EF and asymptomatic NSVT, I would advise that she continue coreg.  We discussed EPS and also consideration of an implantable loop recorder for further evaluation of her tachycardia.  As she is clinically asymptomatic at this time, she would prefer a more conservative approach. She has an echo planned from July.  As long as her EF remains normal, I would not advise additional EP workup.  If her EF declines, she has documented SVT, or increasing symptoms, I would be happy to see her again.  TFTs, bmet, and Mg have already been ordered but are pending.   Follow-up:  With Dr Haroldine Laws as scheduled  Current medicines are reviewed at length with the patient today.   The patient does not have concerns regarding her medicines.  The following changes were made today:  none  Labs/ tests ordered today include:  Orders Placed This Encounter  Procedures  . Basic metabolic panel  . Magnesium  . EKG 12-Lead     Signed, Thompson Grayer, MD  02/06/2018 9:15 AM      [facility] [address] [place] [address] [phone] (office) [phone] (fax)

## 2018-02-06 NOTE — Patient Instructions (Signed)
Medication Instructions:  Your physician recommends that you continue on your current medications as directed. Please refer to the Current Medication list given to you today.   Labwork: TODAY: BMET, MG  Testing/Procedures: None ordered  Follow-Up: Your physician wants you to follow-up with Dr. Rayann Heman AS NEEDED  Any Other Special Instructions Will Be Listed Below (If Applicable).     If you need a refill on your cardiac medications before your next appointment, please call your pharmacy.

## 2018-03-24 NOTE — Progress Notes (Signed)
[name], female   DOB: [birth year], 67 y.o.   MRN: [id number]    Advanced Heart Failure Clinic Note   Referring Physician: Dr.Scott Primary Care: Einar Pheasant Primary Cardiologist: Paraschos HF: Dr. Haroldine Laws   HPI: Kristi Greer is a 67 y/o woman with h/o obesity, migraines and HTN referred by Dr. Nicki Reaper for further evaluation of CHF.  In January 2017 admitted at [facility] with acute CHF and SVT at 150. (No palpitations at the time) Received adenosine but didn't break. (slowed down and ECG looked like possible atrial tach). Eventually converted spontaneously. Echo EF 35-40%. TSH was low at 0.18 but FT4 and FT3 were fine. Felt to possible have hyperthyroidism. Seen by endocrine who started tapazole and a b-blocker. However, had f/u with Endo and they stopped the tapazole saying her thyroid wasn't the problem  Underwent cath 11/26/15 with NICM and well compensated hemodynamics. Monitor placed to look for recurrent SVT.   Echo 4/18 EF 55-60% 30-d monitor 5/19:   - Normal sinus rhythm    - One 13-beat run NSVT on 5/12   - No other high-grade arrhythmias or blocks.   I saw her in 65/19  for one year HF follow up. Was doing well - very active. Exercising 5 days per week. Was having brief episodes of tachycardia on FitBit so 30-d monitor placed.  She did not have SVT.  She did have a single episode of brief nonsustained VT for which she was asymptomatic. She has seen Dr. Rayann Heman. They discussed possible EPS but with normal EF have opted for medical therapy with carvedilol unless EF drops or has recurrent arrhythmias or worsening symptoms. Remains very active. No palpitations or syncope. No CP or SOB. Had an episode on her FitBit on Saturday with HR up to 190 briefly - no symptoms with this.   Echo today EF 55% no RWMA Personally reviewed  Previous studies: cMRI 03/2016 LVEF 48% RV normal. No evidence of infiltrative disease.   On 10/11/15 had Myoview EF 30% No ischemia or scar.    Event monitor 4/17: NSR one 7 beat NSVT. No SVT  Echo 01/31/16 LVEF 40-45%, Grade 1 DD, Normal RV  R/L cath 11/26/15:  Ao = 104/61 (78) LV = 107/6/14 RA = 4 RV = 28/4/8 PA = 25/8 (16) PCW = 7 Fick cardiac output/index = 4.7/2.8 PVR = 2.0 WU SVR = 1265 FA sat = 99% PA sat = 71%, 74% Assessment: 1. Normal coronary arteries 2. 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 Well compensated hemodynamics  SH: Never smoker. Rarely uses ETOH. No family H/o SCD. Multiple family members with CAD.    Past Medical History:  Diagnosis Date  . Allergy   . Cancer ([place])    basal cell  . Cardiomyopathy, nonischemic ([place])   . Carpal tunnel syndrome    bilateral  . Congestive heart failure ([place])    normalization of EF with medical therapy  . Dysrhythmia 08/31/2015   long RP tachycardia  . Female cystocele   . Hyperlipidemia   . Hypertension   . Migraines   . Overactive detrusor   . Personal history of colonic polyps   . PONV (postoperative nausea and vomiting)   . SUI (stress urinary incontinence, female)   . Uterovaginal prolapse   . Vaginal atrophy     Current Outpatient Medications  Medication Sig Dispense Refill  . calcium carbonate (OSCAL) 1500 (600 Ca) MG TABS tablet Take by mouth daily.     . carvedilol (COREG)  12.5 MG tablet TAKE 1 TABLET (12.5 MG TOTAL) BY MOUTH 2 (TWO) TIMES DAILY WITH A MEAL. 180 tablet 3  . Cholecalciferol 1000 units capsule Take 1,000 Units by mouth daily.    . diphenhydramine-acetaminophen (TYLENOL PM) 25-500 MG TABS tablet Take 1 tablet by mouth at bedtime as needed.    . fexofenadine (ALLEGRA) 180 MG tablet Take 180 mg by mouth at bedtime.     . furosemide (LASIX) 20 MG tablet TAKE 1 TABLET (20 MG TOTAL) BY MOUTH EVERY OTHER DAY. 90 tablet 1  . lisinopril (PRINIVIL,ZESTRIL) 5 MG tablet TAKE 1 TABLET (5 MG TOTAL) BY MOUTH AT BEDTIME. REPORTED ON 11/11/2015 90 tablet 3  . pantoprazole (PROTONIX) 40 MG tablet Take 40 mg by mouth daily.    .  simvastatin (ZOCOR) 10 MG tablet TAKE 1 TABLET (10 MG TOTAL) BY MOUTH DAILY AT 6 PM. 30 tablet 11  . spironolactone (ALDACTONE) 25 MG tablet TAKE 0.5 TABLETS (12.5 MG TOTAL) BY MOUTH DAILY. 45 tablet 3  . SUMAtriptan (IMITREX) 100 MG tablet TAKE 1 TABLET BY MOUTH EVERY DAY AS NEEDED FOR MIGRAINE. MAY REPEAT IN 2 HOURS IF HEADACHE 10 tablet 1  . zonisamide (ZONEGRAN) 100 MG capsule TAKE 1 CAPSULE BY MOUTH EVERY DAY 90 capsule 1   No current facility-administered medications for this encounter.     No Known Allergies    Social History   Socioeconomic History  . Marital status: Married    Spouse name: Not on file  . Number of children: 2  . Years of education: Not on file  . Highest education level: Not on file  Occupational History  . Not on file  Social Needs  . Financial resource strain: Not on file  . Food insecurity:    Worry: Not on file    Inability: Not on file  . Transportation needs:    Medical: Not on file    Non-medical: Not on file  Tobacco Use  . Smoking status: Never Smoker  . Smokeless tobacco: Never Used  Substance and Sexual Activity  . Alcohol use: Yes    Alcohol/week: 0.0 oz    Comment: Socially  . Drug use: No  . Sexual activity: Not on file  Lifestyle  . Physical activity:    Days per week: Not on file    Minutes per session: Not on file  . Stress: Not on file  Relationships  . Social connections:    Talks on phone: Not on file    Gets together: Not on file    Attends religious service: Not on file    Active member of club or organization: Not on file    Attends meetings of clubs or organizations: Not on file    Relationship status: Not on file  . Intimate partner violence:    Fear of current or ex partner: Not on file    Emotionally abused: Not on file    Physically abused: Not on file    Forced sexual activity: Not on file  Other Topics Concern  . Not on file  Social History Narrative  . Not on file      Family History  Problem  Relation Age of Onset  . Hypertension Mother   . Stroke Mother   . Diabetes Mother   . Hypercholesterolemia Father   . Heart disease Father        MI  . Hypertension Father   . Stroke Father   . Breast cancer Neg Hx   .  Colon cancer Neg Hx     Vitals:   03/25/18 0934  BP: (!) 108/52  Pulse: 62  SpO2: 100%  Weight: 162 lb (73.5 kg)  Height: 5\' 1"  (1.549 m)   Wt Readings from Last 3 Encounters:  03/25/18 162 lb (73.5 kg)  02/06/18 165 lb (74.8 kg)  12/31/17 165 lb (74.8 kg)   PHYSICAL EXAM: General:  Well appearing. No resp difficulty HEENT: normal Neck: supple. no JVD. Carotids 2+ bilat; no bruits. No lymphadenopathy or thryomegaly appreciated. Cor: PMI nondisplaced. Regular rate & rhythm. No rubs, gallops or murmurs. Lungs: clear Abdomen: soft, nontender, nondistended. No hepatosplenomegaly. No bruits or masses. Good bowel sounds. Extremities: no cyanosis, clubbing, rash, edema Neuro: alert & orientedx3, cranial nerves grossly intact. moves all 4 extremities w/o difficulty. Affect pleasant    ASSESSMENT & PLAN: 1. Non-ischemic cardiomyopathy, resolved  - EF 35-40% echo 1/17. Cath 3/17 EF 30-35% normal cors - Echo 01/2016 EF 40-45%, Grade 1 DD. Suspect she has a viral vs tachy-induced CM.  - CMRI  03/2016. EF 48% - Echo 4/18 EF 55-60% - Echo today EF 55% RV ok. - Doing well  - Volume status looks good. Can take lasix as needed  - Continue spironolactone 12.5 mg daily.  - Increase 18.75 mg BID.  - Continue lisinopril 5 mg daily.   2.h/o SVT - 30-d monitor 5/19. No SVT. 13 beat run NSVT - Has seen Dr. Rayann Heman. Medical therapy for now - Had another episode of tachycardia detected on FitBit this was asx. I discussed with Dr. Rayann Heman possibility of a LinQ monitor but likely will not change management in setting of normal EF and no sx.  - Increase carvedilol to 18.75 bid  - Check K and Mag and thyroid  3. Snoring  - Sleep study 5/19 AHI 3.1  Will arrange f/u with  Dr. Gelene Mink Quincee Gittens,MD 10:07 AM

## 2018-03-25 ENCOUNTER — Ambulatory Visit (HOSPITAL_COMMUNITY)
Admission: RE | Admit: 2018-03-25 | Discharge: 2018-03-25 | Disposition: A | Discharge status: Home / Self Care | Payer: Medicare Other | Source: Ambulatory Visit | Attending: Internal Medicine | Admitting: Internal Medicine

## 2018-03-25 ENCOUNTER — Ambulatory Visit (HOSPITAL_BASED_OUTPATIENT_CLINIC_OR_DEPARTMENT_OTHER)
Admission: RE | Admit: 2018-03-25 | Discharge: 2018-03-25 | Disposition: A | Discharge status: Home / Self Care | Payer: Medicare Other | Source: Ambulatory Visit | Attending: Internal Medicine | Admitting: Internal Medicine

## 2018-03-25 ENCOUNTER — Other Ambulatory Visit: Payer: Self-pay

## 2018-03-25 VITALS — BP 108/52 | HR 62 | Ht 61.0 in | Wt 162.0 lb

## 2018-03-25 DIAGNOSIS — I5022 Chronic systolic (congestive) heart failure: Secondary | ICD-10-CM

## 2018-03-25 DIAGNOSIS — N3281 Overactive bladder: Secondary | ICD-10-CM | POA: Insufficient documentation

## 2018-03-25 DIAGNOSIS — Z8249 Family history of ischemic heart disease and other diseases of the circulatory system: Secondary | ICD-10-CM | POA: Diagnosis not present

## 2018-03-25 DIAGNOSIS — E785 Hyperlipidemia, unspecified: Secondary | ICD-10-CM | POA: Insufficient documentation

## 2018-03-25 DIAGNOSIS — G5603 Carpal tunnel syndrome, bilateral upper limbs: Secondary | ICD-10-CM | POA: Insufficient documentation

## 2018-03-25 DIAGNOSIS — Z79899 Other long term (current) drug therapy: Secondary | ICD-10-CM | POA: Insufficient documentation

## 2018-03-25 DIAGNOSIS — G43909 Migraine, unspecified, not intractable, without status migrainosus: Secondary | ICD-10-CM | POA: Insufficient documentation

## 2018-03-25 DIAGNOSIS — E669 Obesity, unspecified: Secondary | ICD-10-CM | POA: Insufficient documentation

## 2018-03-25 DIAGNOSIS — I428 Other cardiomyopathies: Secondary | ICD-10-CM

## 2018-03-25 DIAGNOSIS — I429 Cardiomyopathy, unspecified: Secondary | ICD-10-CM | POA: Diagnosis not present

## 2018-03-25 DIAGNOSIS — I110 Hypertensive heart disease with heart failure: Secondary | ICD-10-CM | POA: Diagnosis not present

## 2018-03-25 DIAGNOSIS — I472 Ventricular tachycardia: Secondary | ICD-10-CM | POA: Diagnosis not present

## 2018-03-25 DIAGNOSIS — Z6831 Body mass index (BMI) 31.0-31.9, adult: Secondary | ICD-10-CM | POA: Insufficient documentation

## 2018-03-25 DIAGNOSIS — I4729 Other ventricular tachycardia: Secondary | ICD-10-CM

## 2018-03-25 DIAGNOSIS — R0683 Snoring: Secondary | ICD-10-CM | POA: Diagnosis not present

## 2018-03-25 LAB — COMPREHENSIVE METABOLIC PANEL
ALK PHOS: 113 U/L (ref 38–126)
ALT: 17 U/L (ref 0–44)
AST: 22 U/L (ref 15–41)
Albumin: 3.7 g/dL (ref 3.5–5.0)
Anion gap: 7 (ref 5–15)
BUN: 15 mg/dL (ref 8–23)
CALCIUM: 9.7 mg/dL (ref 8.9–10.3)
CHLORIDE: 107 mmol/L (ref 98–111)
CO2: 26 mmol/L (ref 22–32)
Creatinine, Ser: 0.82 mg/dL (ref 0.44–1.00)
GFR calc Af Amer: >60 mL/min (ref >60)
GFR calc non Af Amer: >60 mL/min (ref >60)
GLUCOSE: 105 mg/dL — AB (ref 70–99)
Potassium: 4.7 mmol/L (ref 3.5–5.1)
SODIUM: 140 mmol/L (ref 135–145)
Total Bilirubin: 0.6 mg/dL (ref 0.3–1.2)
Total Protein: 6.9 g/dL (ref 6.5–8.1)

## 2018-03-25 LAB — T4, FREE: Free T4: 0.88 ng/dL (ref 0.82–1.77)

## 2018-03-25 LAB — MAGNESIUM: Magnesium: 2.2 mg/dL (ref 1.7–2.4)

## 2018-03-25 LAB — TSH: TSH: <0.01 u[IU]/mL — ABNORMAL LOW (ref 0.350–4.500)

## 2018-03-25 NOTE — Patient Instructions (Addendum)
Labs today (will call for abnormal results, otherwise no news is good news)  Follow up with Dr. Rockey Situ in 6 months, their office will contact you to schedule appointment.   Follow up in our clinic as needed.

## 2018-03-25 NOTE — Addendum Note (Signed)
Encounter addended by: Darron Doom, RN on: 03/25/2018 2:54 PM  Actions taken: Result note filed, Results reviewed in IB

## 2018-03-25 NOTE — Progress Notes (Signed)
  Echocardiogram 2D Echocardiogram has been performed.  Kristi Greer 03/25/2018, 9:35 AM

## 2018-03-25 NOTE — Addendum Note (Signed)
Encounter addended by: Darron Doom, RN on: 03/25/2018 10:16 AM  Actions taken: Visit diagnoses modified, Order list changed, Diagnosis association updated, Sign clinical note

## 2018-04-04 ENCOUNTER — Other Ambulatory Visit: Payer: Self-pay | Admitting: Internal Medicine

## 2018-04-23 ENCOUNTER — Other Ambulatory Visit: Payer: Self-pay | Admitting: Internal Medicine

## 2018-04-30 ENCOUNTER — Other Ambulatory Visit (INDEPENDENT_AMBULATORY_CARE_PROVIDER_SITE_OTHER): Payer: Medicare Other

## 2018-04-30 DIAGNOSIS — I420 Dilated cardiomyopathy: Secondary | ICD-10-CM | POA: Diagnosis not present

## 2018-04-30 DIAGNOSIS — R7989 Other specified abnormal findings of blood chemistry: Secondary | ICD-10-CM

## 2018-04-30 DIAGNOSIS — E7800 Pure hypercholesterolemia, unspecified: Secondary | ICD-10-CM

## 2018-04-30 DIAGNOSIS — R739 Hyperglycemia, unspecified: Secondary | ICD-10-CM

## 2018-04-30 DIAGNOSIS — I471 Supraventricular tachycardia: Secondary | ICD-10-CM | POA: Diagnosis not present

## 2018-04-30 LAB — BASIC METABOLIC PANEL
BUN: 17 mg/dL (ref 6–23)
CALCIUM: 9.2 mg/dL (ref 8.4–10.5)
CHLORIDE: 109 meq/L (ref 96–112)
CO2: 26 mEq/L (ref 19–32)
Creatinine, Ser: 1.01 mg/dL (ref 0.40–1.20)
GFR: 58.4 mL/min — AB (ref >60.00)
Glucose, Bld: 104 mg/dL — ABNORMAL HIGH (ref 70–99)
POTASSIUM: 4.4 meq/L (ref 3.5–5.1)
Sodium: 142 mEq/L (ref 135–145)

## 2018-04-30 LAB — LIPID PANEL
CHOLESTEROL: 145 mg/dL (ref 0–200)
HDL: 44.3 mg/dL (ref >39.00)
LDL Cholesterol: 82 mg/dL (ref 0–99)
NonHDL: 100.44
Total CHOL/HDL Ratio: 3
Triglycerides: 92 mg/dL (ref 0.0–149.0)
VLDL: 18.4 mg/dL (ref 0.0–40.0)

## 2018-04-30 LAB — HEPATIC FUNCTION PANEL
ALT: 12 U/L (ref 0–35)
AST: 19 U/L (ref 0–37)
Albumin: 3.9 g/dL (ref 3.5–5.2)
Alkaline Phosphatase: 91 U/L (ref 39–117)
BILIRUBIN DIRECT: 0.1 mg/dL (ref 0.0–0.3)
BILIRUBIN TOTAL: 0.4 mg/dL (ref 0.2–1.2)
Total Protein: 6.7 g/dL (ref 6.0–8.3)

## 2018-04-30 LAB — T4, FREE: Free T4: 0.82 ng/dL (ref 0.60–1.60)

## 2018-04-30 LAB — HEMOGLOBIN A1C: Hgb A1c MFr Bld: 6.2 % (ref 4.6–6.5)

## 2018-04-30 LAB — T3, FREE: T3, Free: 4.3 pg/mL — ABNORMAL HIGH (ref 2.3–4.2)

## 2018-04-30 LAB — TSH: TSH: <0.01 u[IU]/mL — ABNORMAL LOW (ref 0.35–4.50)

## 2018-05-01 ENCOUNTER — Ambulatory Visit (INDEPENDENT_AMBULATORY_CARE_PROVIDER_SITE_OTHER): Payer: Medicare Other | Admitting: Internal Medicine

## 2018-05-01 VITALS — BP 118/70 | HR 88 | Temp 97.6°F | Resp 18 | Wt 164.0 lb

## 2018-05-01 DIAGNOSIS — R7989 Other specified abnormal findings of blood chemistry: Secondary | ICD-10-CM | POA: Diagnosis not present

## 2018-05-01 DIAGNOSIS — E7800 Pure hypercholesterolemia, unspecified: Secondary | ICD-10-CM | POA: Diagnosis not present

## 2018-05-01 DIAGNOSIS — I5022 Chronic systolic (congestive) heart failure: Secondary | ICD-10-CM | POA: Diagnosis not present

## 2018-05-01 DIAGNOSIS — R739 Hyperglycemia, unspecified: Secondary | ICD-10-CM | POA: Diagnosis not present

## 2018-05-01 DIAGNOSIS — I420 Dilated cardiomyopathy: Secondary | ICD-10-CM

## 2018-05-01 DIAGNOSIS — Z23 Encounter for immunization: Secondary | ICD-10-CM | POA: Diagnosis not present

## 2018-05-01 DIAGNOSIS — G5603 Carpal tunnel syndrome, bilateral upper limbs: Secondary | ICD-10-CM

## 2018-05-01 DIAGNOSIS — Z1231 Encounter for screening mammogram for malignant neoplasm of breast: Secondary | ICD-10-CM | POA: Diagnosis not present

## 2018-05-01 DIAGNOSIS — J42 Unspecified chronic bronchitis: Secondary | ICD-10-CM

## 2018-05-01 DIAGNOSIS — K219 Gastro-esophageal reflux disease without esophagitis: Secondary | ICD-10-CM | POA: Diagnosis not present

## 2018-05-01 DIAGNOSIS — Z1239 Encounter for other screening for malignant neoplasm of breast: Secondary | ICD-10-CM

## 2018-05-01 DIAGNOSIS — M79671 Pain in right foot: Secondary | ICD-10-CM | POA: Diagnosis not present

## 2018-05-01 DIAGNOSIS — R350 Frequency of micturition: Secondary | ICD-10-CM

## 2018-05-01 LAB — URINALYSIS, ROUTINE W REFLEX MICROSCOPIC
BILIRUBIN URINE: NEGATIVE
Ketones, ur: NEGATIVE
Nitrite: NEGATIVE
Specific Gravity, Urine: 1.015 (ref 1.000–1.030)
Total Protein, Urine: NEGATIVE
URINE GLUCOSE: NEGATIVE
Urobilinogen, UA: 0.2 (ref 0.0–1.0)
pH: 8 (ref 5.0–8.0)

## 2018-05-01 NOTE — Progress Notes (Signed)
[name], female   DOB: 31-Mar-1951, 67 y.o.   MRN: [id number]   Subjective:    [name], female    DOB: November 02, 1950, 67 y.o.   MRN: [id number]  HPI  Patient here for a scheduled follow up.  She reports she is doing relatively well.  S/p right carpal tunnel release.  Recently evaluated by ortho for right carpal tunnel syndrome.  Wearing a splint.  Still some issues.  Plans to f/u with ortho.  Saw cardiology recently.  On carvedilol.  Just increased dose.  Felt stable.  Breathing stable.  No chest pain.  No acid reflux.  No abdominal pain.  Does report increased urinary frequency.  No dysuria.  Feels similar to previous uti.  No nausea or vomiting.  Exercising.  Is having issues with her right heel.  Persistent pain.  Has tried supports.  Discussed podiatry referral.  Discussed recent labs.  Discussed suppressed tsh and elevated free T3.  Discussed referral to endocrinology.  Enjoying retirement.     Past Medical History:  Diagnosis Date  . Allergy   . Cancer ([place])    basal cell  . Cardiomyopathy, nonischemic ([place])   . Carpal tunnel syndrome    bilateral  . Congestive heart failure ([place])    normalization of EF with medical therapy  . Dysrhythmia 08/31/2015   long RP tachycardia  . Female cystocele   . Hyperlipidemia   . Hypertension   . Migraines   . Overactive detrusor   . Personal history of colonic polyps   . PONV (postoperative nausea and vomiting)   . SUI (stress urinary incontinence, female)   . Uterovaginal prolapse   . Vaginal atrophy    Past Surgical History:  Procedure Laterality Date  . ANTERIOR AND POSTERIOR VAGINAL REPAIR W/ SACROSPINOUS LIGAMENT SUSPENSION  10/18/2012  . CARDIAC CATHETERIZATION N/A 11/26/2015   Procedure: Right/Left Heart Cath and Coronary Angiography;  Surgeon: Jolaine Artist, MD;  Location: [facility];  Service: Cardiovascular;  Laterality: N/A;  . CHOLECYSTECTOMY  2006  . COLONOSCOPY WITH  PROPOFOL N/A 03/19/2017   Procedure: COLONOSCOPY WITH PROPOFOL;  Surgeon: Lollie Sails, MD;  Location: [facility] ENDOSCOPY;  Service: Endoscopy;  Laterality: N/A;  . COLPOPEXY  10/18/2012  . CYSTOURETHROSCOPY  10/18/2012  . ESOPHAGOGASTRODUODENOSCOPY (EGD) WITH PROPOFOL N/A 10/03/2016   Procedure: ESOPHAGOGASTRODUODENOSCOPY (EGD) WITH PROPOFOL;  Surgeon: Lollie Sails, MD;  Location: [facility] ENDOSCOPY;  Service: Endoscopy;  Laterality: N/A;  . EYE SURGERY     cataract extration  . FINGER SURGERY    . HYSTEROSCOPY  2011, 10/2010  . TONSILLECTOMY    . TUBAL LIGATION    . VAGINAL HYSTERECTOMY     midurethral sling   Family History  Problem Relation Age of Onset  . Hypertension Mother   . Stroke Mother   . Diabetes Mother   . Hypercholesterolemia Father   . Heart disease Father        MI  . Hypertension Father   . Stroke Father   . Breast cancer Neg Hx   . Colon cancer Neg Hx    Social History   Socioeconomic History  . Marital status: Married    Spouse name: Not on file  . Number of children: 2  . Years of education: Not on file  . Highest education level: Not on file  Occupational History  . Not on file  Social Needs  . Financial resource strain: Not on file  .  Food insecurity:    Worry: Not on file    Inability: Not on file  . Transportation needs:    Medical: Not on file    Non-medical: Not on file  Tobacco Use  . Smoking status: Never Smoker  . Smokeless tobacco: Never Used  Substance and Sexual Activity  . Alcohol use: Yes    Alcohol/week: 0.0 standard drinks    Comment: Socially  . Drug use: No  . Sexual activity: Not on file  Lifestyle  . Physical activity:    Days per week: Not on file    Minutes per session: Not on file  . Stress: Not on file  Relationships  . Social connections:    Talks on phone: Not on file    Gets together: Not on file    Attends religious service: Not on file    Active member of club or organization: Not on file    Attends  meetings of clubs or organizations: Not on file    Relationship status: Not on file  Other Topics Concern  . Not on file  Social History Narrative  . Not on file    Outpatient Encounter Medications as of 05/01/2018  Medication Sig  . calcium carbonate (OSCAL) 1500 (600 Ca) MG TABS tablet Take by mouth daily.   . carvedilol (COREG) 12.5 MG tablet TAKE 1 TABLET (12.5 MG TOTAL) BY MOUTH 2 (TWO) TIMES DAILY WITH A MEAL. (Patient taking differently: Take 12.5 mg by mouth 2 (two) times daily with a meal. Take 1.5 tablets 2 times daily with meals)  . Cholecalciferol 1000 units capsule Take 1,000 Units by mouth daily.  . diphenhydramine-acetaminophen (TYLENOL PM) 25-500 MG TABS tablet Take 1 tablet by mouth at bedtime as needed.  . fexofenadine (ALLEGRA) 180 MG tablet Take 180 mg by mouth at bedtime.   . furosemide (LASIX) 20 MG tablet TAKE 1 TABLET (20 MG TOTAL) BY MOUTH EVERY OTHER DAY.  Marland Kitchen lisinopril (PRINIVIL,ZESTRIL) 5 MG tablet TAKE 1 TABLET (5 MG TOTAL) BY MOUTH AT BEDTIME. REPORTED ON 11/11/2015  . pantoprazole (PROTONIX) 40 MG tablet Take 40 mg by mouth daily.  . simvastatin (ZOCOR) 10 MG tablet TAKE 1 TABLET (10 MG TOTAL) BY MOUTH DAILY AT 6 PM.  . spironolactone (ALDACTONE) 25 MG tablet TAKE 0.5 TABLETS (12.5 MG TOTAL) BY MOUTH DAILY.  . SUMAtriptan (IMITREX) 100 MG tablet TAKE 1 TABLET BY MOUTH EVERY DAY AS NEEDED FOR MIGRAINE. MAY REPEAT IN 2 HOURS IF HEADACHE  . zonisamide (ZONEGRAN) 100 MG capsule TAKE 1 CAPSULE BY MOUTH EVERY DAY   No facility-administered encounter medications on file as of 05/01/2018.     Review of Systems  Constitutional: Negative for appetite change and unexpected weight change.  HENT: Negative for congestion and sinus pressure.   Respiratory: Negative for cough and chest tightness.        Breathing stable.    Cardiovascular: Negative for chest pain, palpitations and leg swelling.  Gastrointestinal: Negative for abdominal pain, diarrhea, nausea and vomiting.    Genitourinary: Positive for frequency. Negative for difficulty urinating, dysuria, vaginal discharge and vaginal pain.  Musculoskeletal: Negative for joint swelling and myalgias.       Right heel pain as outlined.  Left carpal tunnel syndrome.    Skin: Negative for color change and rash.  Neurological: Negative for dizziness, light-headedness and headaches.  Psychiatric/Behavioral: Negative for dysphoric mood.       Objective:    Physical Exam  Constitutional: She appears well-developed and well-nourished. No  distress.  HENT:  Nose: Nose normal.  Mouth/Throat: Oropharynx is clear and moist.  Neck: Neck supple. No thyromegaly present.  Cardiovascular: Normal rate and regular rhythm.  Pulmonary/Chest: Breath sounds normal. No respiratory distress. She has no wheezes.  Abdominal: Soft. Bowel sounds are normal. There is no tenderness.  Musculoskeletal: She exhibits no edema or tenderness.  Increased pain - right heel.    Lymphadenopathy:    She has no cervical adenopathy.  Skin: No rash noted. No erythema.  Psychiatric: She has a normal mood and affect. Her behavior is normal.    BP 118/70 (BP Location: Left Arm, Patient Position: Sitting, Cuff Size: Normal)   Pulse 88   Temp 97.6 F (36.4 C) (Oral)   Resp 18   Wt 164 lb (74.4 kg)   LMP 10/18/2012   SpO2 98%   BMI 30.99 kg/m  Wt Readings from Last 3 Encounters:  05/01/18 164 lb (74.4 kg)  03/25/18 162 lb (73.5 kg)  02/06/18 165 lb (74.8 kg)     Lab Results  Component Value Date   WBC 9.1 06/22/2017   HGB 13.5 06/22/2017   HCT 42.7 06/22/2017   PLT 242.0 06/22/2017   GLUCOSE 104 (H) 04/30/2018   CHOL 145 04/30/2018   TRIG 92.0 04/30/2018   HDL 44.30 04/30/2018   LDLCALC 82 04/30/2018   ALT 12 04/30/2018   AST 19 04/30/2018   NA 142 04/30/2018   K 4.4 04/30/2018   CL 109 04/30/2018   CREATININE 1.01 04/30/2018   BUN 17 04/30/2018   CO2 26 04/30/2018   TSH <0.01 (L) 04/30/2018   INR 0.98 11/26/2015    HGBA1C 6.2 04/30/2018       Assessment & Plan:   Problem List Items Addressed This Visit    Bilateral carpal tunnel syndrome    S/p right carpal tunnel release.  Doing well.  With left CTS now.  Seeing ortho.        Chronic bronchitis ([place])    Has been followed by pulmonary.  No increased cough.  Breathing stable.        Chronic systolic heart failure (HCC) (Chronic)    Followed by cardiology.  Doing well on current regimen.  Recently had carvedilol increased.  Follow.        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HCC) (Chronic)    Followed by cardiology.  Doing well on current regimen.       GERD (gastroesophageal reflux disease)    Controlled on current regimen.  Follow.        Hypercholesterolemia    On simvastatin.  Low cholesterol diet and exercise.  Follow lipid panel and liver function tests.        Relevant Orders   CBC with Differential/Platelet   Hepatic function panel   Lipid panel   Basic metabolic panel   Pain of right heel    Persistent pain right heel.  Has tried supports.  Can limit her exercise.  Refer to podiatry.       Relevant Orders   Ambulatory referral to Podiatry    Other Visit Diagnoses    Low TSH level    -  Primary   Low tsh with slightly elevated free T3.  refer to endocrinology for further evaluation and w/up.     Relevant Orders   Ambulatory referral to Endocrinology   Breast cancer screening       Relevant Orders   MM 3D SCREEN BREAST BILATERAL   Urinary frequency  urinary frequency.  check urine to confirm if infection present.    Relevant Orders   Urinalysis, Routine w reflex microscopic (Completed)   Urine Culture (Completed)   Need for influenza vaccination       Relevant Orders   Flu vaccine HIGH DOSE PF (Fluzone High dose) (Completed)   Hyperglycemia       low carb diet and exercise.  follow met b and a1c.     Relevant Orders   Hemoglobin A1c       Einar Pheasant, MD

## 2018-05-02 ENCOUNTER — Other Ambulatory Visit: Payer: Self-pay

## 2018-05-02 MED ORDER — CEFDINIR 300 MG PO CAPS: 300.0000 mg | ORAL_CAPSULE | Freq: Two times a day (BID) | ORAL | 0 refills | Status: DC

## 2018-05-02 NOTE — Progress Notes (Unsigned)
e

## 2018-05-03 ENCOUNTER — Telehealth: Payer: Self-pay | Admitting: *Deleted

## 2018-05-03 LAB — URINE CULTURE
MICRO NUMBER:: 91055884
SPECIMEN QUALITY:: ADEQUATE

## 2018-05-03 NOTE — Telephone Encounter (Signed)
Pt was requesting urine culture results. Advised that we are still waiting on results

## 2018-05-03 NOTE — Telephone Encounter (Signed)
Copied from [place] [id number]. Topic: Inquiry >> May 03, 2018 12:37 PM Conception Chancy, NT wrote: Reason for CRM: patient would like Dr. Nicki Reaper nurse to give her a call about her urine results.

## 2018-05-04 ENCOUNTER — Encounter: Payer: Self-pay | Admitting: Internal Medicine

## 2018-05-04 DIAGNOSIS — M79671 Pain in right foot: Secondary | ICD-10-CM | POA: Insufficient documentation

## 2018-05-04 NOTE — Assessment & Plan Note (Signed)
Followed by cardiology.  Doing well on current regimen.  Recently had carvedilol increased.  Follow.

## 2018-05-04 NOTE — Assessment & Plan Note (Signed)
Followed by cardiology.  Doing well on current regimen.   

## 2018-05-04 NOTE — Assessment & Plan Note (Signed)
S/p right carpal tunnel release.  Doing well.  With left CTS now.  Seeing ortho.

## 2018-05-04 NOTE — Assessment & Plan Note (Signed)
Persistent pain right heel.  Has tried supports.  Can limit her exercise.  Refer to podiatry.

## 2018-05-04 NOTE — Assessment & Plan Note (Signed)
On simvastatin.  Low cholesterol diet and exercise.  Follow lipid panel and liver function tests.   

## 2018-05-04 NOTE — Assessment & Plan Note (Signed)
Has been followed by pulmonary.  No increased cough.  Breathing stable.

## 2018-05-04 NOTE — Assessment & Plan Note (Signed)
Controlled on current regimen.  Follow.  

## 2018-05-06 ENCOUNTER — Other Ambulatory Visit: Payer: Self-pay | Admitting: Internal Medicine

## 2018-05-06 DIAGNOSIS — R319 Hematuria, unspecified: Secondary | ICD-10-CM

## 2018-05-06 NOTE — Progress Notes (Signed)
Order placed for f/u urinalysis 

## 2018-05-14 ENCOUNTER — Telehealth: Payer: Self-pay | Admitting: Internal Medicine

## 2018-05-14 NOTE — Telephone Encounter (Signed)
Per note from March patient does not want to fu .  Removing recall per request

## 2018-05-27 ENCOUNTER — Encounter: Payer: Self-pay | Admitting: Internal Medicine

## 2018-05-27 ENCOUNTER — Other Ambulatory Visit (INDEPENDENT_AMBULATORY_CARE_PROVIDER_SITE_OTHER): Payer: Medicare Other

## 2018-05-27 ENCOUNTER — Telehealth: Payer: Self-pay | Admitting: Internal Medicine

## 2018-05-27 DIAGNOSIS — R319 Hematuria, unspecified: Secondary | ICD-10-CM | POA: Diagnosis not present

## 2018-05-27 LAB — URINALYSIS, ROUTINE W REFLEX MICROSCOPIC
Bilirubin Urine: NEGATIVE
Hgb urine dipstick: NEGATIVE
Ketones, ur: NEGATIVE
NITRITE: NEGATIVE
PH: 5.5 (ref 5.0–8.0)
TOTAL PROTEIN, URINE-UPE24: NEGATIVE
URINE GLUCOSE: NEGATIVE
UROBILINOGEN UA: 0.2 (ref 0.0–1.0)

## 2018-05-27 MED ORDER — SCOPOLAMINE 1 MG/3DAYS TD PT72: 1.0000 | MEDICATED_PATCH | TRANSDERMAL | 0 refills | Status: DC

## 2018-05-27 NOTE — Telephone Encounter (Signed)
rx ok'd for scopolamine patches and sent in.

## 2018-05-27 NOTE — Telephone Encounter (Signed)
Patient aware.

## 2018-05-27 NOTE — Telephone Encounter (Signed)
Pt came in for labs and stated that she is going on a cruise and would like motion sickness patches called in to [id number] - [place], [place]

## 2018-05-27 NOTE — Telephone Encounter (Signed)
Ok to send in scopolamine patches?

## 2018-06-04 ENCOUNTER — Ambulatory Visit (INDEPENDENT_AMBULATORY_CARE_PROVIDER_SITE_OTHER): Payer: Medicare Other | Admitting: Podiatry

## 2018-06-04 ENCOUNTER — Encounter: Payer: Self-pay | Admitting: Podiatry

## 2018-06-04 ENCOUNTER — Ambulatory Visit (INDEPENDENT_AMBULATORY_CARE_PROVIDER_SITE_OTHER): Payer: Medicare Other

## 2018-06-04 ENCOUNTER — Other Ambulatory Visit: Payer: Self-pay | Admitting: Podiatry

## 2018-06-04 DIAGNOSIS — M7661 Achilles tendinitis, right leg: Secondary | ICD-10-CM | POA: Diagnosis not present

## 2018-06-04 DIAGNOSIS — M722 Plantar fascial fibromatosis: Secondary | ICD-10-CM

## 2018-06-04 MED ORDER — MELOXICAM 15 MG PO TABS: 15.0000 mg | ORAL_TABLET | Freq: Every day | ORAL | 1 refills | Status: AC

## 2018-06-05 ENCOUNTER — Encounter: Payer: Self-pay | Admitting: Podiatry

## 2018-06-05 NOTE — Progress Notes (Signed)
   HPI: 67 year old female presenting today as a new patient with a chief complaint of intermittent sharp pain in the right heel and achilles that has been ongoing for the past 6 months. Touching the area and excessive walking increases the pain. She also states standing after sitting for a long period of time increases the pain. She has not done anything for treatment. Patient is here for further evaluation and treatment.    Past Medical History:  Diagnosis Date  . Allergy   . Cancer ([place])    basal cell  . Cardiomyopathy, nonischemic ([place])   . Carpal tunnel syndrome    bilateral  . Congestive heart failure ([place])    normalization of EF with medical therapy  . Dysrhythmia 08/31/2015   long RP tachycardia  . Female cystocele   . Hyperlipidemia   . Hypertension   . Migraines   . Overactive detrusor   . Personal history of colonic polyps   . PONV (postoperative nausea and vomiting)   . SUI (stress urinary incontinence, female)   . Uterovaginal prolapse   . Vaginal atrophy       Physical Exam: General: The patient is alert and oriented x3 in no acute distress.  Dermatology: Skin is warm, dry and supple bilateral lower extremities. Negative for open lesions or macerations.  Vascular: Palpable pedal pulses bilaterally. No edema or erythema noted. Capillary refill within normal limits.  Neurological: Epicritic and protective threshold grossly intact bilaterally.   Musculoskeletal Exam: Pain on palpation noted to the posterior tubercle of the right calcaneus at the insertion of the Achilles tendon consistent with retrocalcaneal bursitis. Range of motion within normal limits. Muscle strength 5/5 in all muscle groups bilateral lower extremities.  Radiographic Exam:  Posterior calcaneal spur noted to the respective calcaneus on lateral view. No fracture or dislocation noted. Normal osseous mineralization noted.     Assessment: 1. Insertional Achilles tendinitis right 2.  Retrocalcaneal bursitis   Plan of Care:  1. Patient was evaluated. Radiographs were reviewed today. 2. Injection of 0.5 mL Celestone Soluspan injected into the retrocalcaneal bursa. Care was taken to avoid direct injection into the Achilles tendon. 3. Prescription for Meloxicam provided to patient.  4. Silicone heel sleeve dispensed.  5. Continue using Brooks shoes and OTC insoles.  6. Return to clinic as needed.   Going on a cruise on 06/06/18 to [place].    Edrick Kins, DPM [facility]  Dr. Edrick Kins, [address]                                        [place], [place] [address]                Office [phone]  Fax [phone]

## 2018-06-18 ENCOUNTER — Encounter: Payer: Self-pay | Admitting: Endocrinology

## 2018-06-18 ENCOUNTER — Ambulatory Visit (INDEPENDENT_AMBULATORY_CARE_PROVIDER_SITE_OTHER): Payer: Medicare Other | Admitting: Endocrinology

## 2018-06-18 DIAGNOSIS — E0590 Thyrotoxicosis, unspecified without thyrotoxic crisis or storm: Secondary | ICD-10-CM

## 2018-06-18 MED ORDER — METHIMAZOLE 10 MG PO TABS: 10.0000 mg | ORAL_TABLET | Freq: Two times a day (BID) | ORAL | 11 refills | Status: DC

## 2018-06-18 NOTE — Patient Instructions (Addendum)
I have sent a prescription to your pharmacy, to slow the thyroid. If ever you have fever while taking methimazole, stop it and call us, even if the reason is obvious, because of the risk of a rare side-effect. Please come back for a follow-up appointment in 1 month.     Hyperthyroidism Hyperthyroidism is when the thyroid is too active (overactive). Your thyroid is a large gland that is located in your neck. The thyroid helps to control how your body uses food (metabolism). When your thyroid is overactive, it produces too much of a hormone called thyroxine. What are the causes? Causes of hyperthyroidism may include:  Graves disease. This is when your immune system attacks the thyroid gland. This is the most common cause.  Inflammation of the thyroid gland.  Tumor in the thyroid gland or somewhere else.  Excessive use of thyroid medicines, including: ? Prescription thyroid supplement. ? Herbal supplements that mimic thyroid hormones.  Solid or fluid-filled lumps within your thyroid gland (thyroid nodules).  Excessive ingestion of iodine.  What increases the risk?  Being female.  Having a family history of thyroid conditions. What are the signs or symptoms? Signs and symptoms of hyperthyroidism may include:  Nervousness.  Inability to tolerate heat.  Unexplained weight loss.  Diarrhea.  Change in the texture of hair or skin.  Heart skipping beats or making extra beats.  Rapid heart rate.  Loss of menstruation.  Shaky hands.  Fatigue.  Restlessness.  Increased appetite.  Sleep problems.  Enlarged thyroid gland or nodules.  How is this diagnosed? Diagnosis of hyperthyroidism may include:  Medical history and physical exam.  Blood tests.  Ultrasound tests.  How is this treated? Treatment may include:  Medicines to control your thyroid.  Surgery to remove your thyroid.  Radiation pill.  Follow these instructions at home:  Take medicines only  as directed by your health care provider.  Do not use any tobacco products, including cigarettes, chewing tobacco, or electronic cigarettes. If you need help quitting, ask your health care provider.  Do not exercise or do physical activity until your health care provider approves.  Keep all follow-up appointments as directed by your health care provider. This is important. Contact a health care provider if:  Your symptoms do not get better with treatment.  You have fever.  You are taking thyroid replacement medicine and you: ? Have depression. ? Feel mentally and physically slow. ? Have weight gain. Get help right away if:  You have decreased alertness or a change in your awareness.  You have abdominal pain.  You feel dizzy.  You have a rapid heartbeat.  You have an irregular heartbeat. This information is not intended to replace advice given to you by your health care provider. Make sure you discuss any questions you have with your health care provider. Document Released: 08/14/2005 Document Revised: 01/13/2016 Document Reviewed: 12/30/2013 Elsevier Interactive Patient Education  2018 [employer].

## 2018-06-18 NOTE — Progress Notes (Signed)
Subjective:    [name], female    DOB: 08/19/1951, 67 y.o.   MRN: [id number]  HPI Pt is referred by Dr Nicki Reaper, for hyperthyroidism.  Pt reports she was dx'ed with hyperthyroidism in 2015, when she presented with SVT.  She was rx'ed with an uncertain medication for a few months in 2015.  she has never had XRT to the anterior neck, or thyroid surgery.  she has never had thyroid imaging.  she does not consume kelp or any other non-prescribed thyroid medication.  she has never been on amiodarone.  She has slight palpitations in the chest, but no assoc anxiety. Past Medical History:  Diagnosis Date  . Allergy   . Cancer ([place])    basal cell  . Cardiomyopathy, nonischemic ([place])   . Carpal tunnel syndrome    bilateral  . Congestive heart failure ([place])    normalization of EF with medical therapy  . Dysrhythmia 08/31/2015   long RP tachycardia  . Female cystocele   . Hyperlipidemia   . Hypertension   . Migraines   . Overactive detrusor   . Personal history of colonic polyps   . PONV (postoperative nausea and vomiting)   . SUI (stress urinary incontinence, female)   . Uterovaginal prolapse   . Vaginal atrophy     Past Surgical History:  Procedure Laterality Date  . ANTERIOR AND POSTERIOR VAGINAL REPAIR W/ SACROSPINOUS LIGAMENT SUSPENSION  10/18/2012  . CARDIAC CATHETERIZATION N/A 11/26/2015   Procedure: Right/Left Heart Cath and Coronary Angiography;  Surgeon: Jolaine Artist, MD;  Location: [place] [facility];  Service: Cardiovascular;  Laterality: N/A;  . CHOLECYSTECTOMY  2006  . COLONOSCOPY WITH PROPOFOL N/A 03/19/2017   Procedure: COLONOSCOPY WITH PROPOFOL;  Surgeon: Lollie Sails, MD;  Location[facility] ENDOSCOPY;  Service: Endoscopy;  Laterality: N/A;  . COLPOPEXY  10/18/2012  . CYSTOURETHROSCOPY  10/18/2012  . ESOPHAGOGASTRODUODENOSCOPY (EGD) WITH PROPOFOL N/A 10/03/2016   Procedure: ESOPHAGOGASTRODUODENOSCOPY (EGD) WITH PROPOFOL;  Surgeon: Lollie Sails, MD;  Location: [facility] ENDOSCOPY;  Service: Endoscopy;  Laterality: N/A;  . EYE SURGERY     cataract extration  . FINGER SURGERY    . HYSTEROSCOPY  2011, 10/2010  . TONSILLECTOMY    . TUBAL LIGATION    . VAGINAL HYSTERECTOMY     midurethral sling    Social History   Socioeconomic History  . Marital status: Married    Spouse name: Not on file  . Number of children: 2  . Years of education: Not on file  . Highest education level: Not on file  Occupational History  . Not on file  Social Needs  . Financial resource strain: Not on file  . Food insecurity:    Worry: Not on file    Inability: Not on file  . Transportation needs:    Medical: Not on file    Non-medical: Not on file  Tobacco Use  . Smoking status: Never Smoker  . Smokeless tobacco: Never Used  Substance and Sexual Activity  . Alcohol use: Yes    Alcohol/week: 0.0 standard drinks    Comment: Socially  . Drug use: No  . Sexual activity: Not on file  Lifestyle  . Physical activity:    Days per week: Not on file    Minutes per session: Not on file  . Stress: Not on file  Relationships  . Social connections:    Talks on phone: Not on file    Gets together: Not  on file    Attends religious service: Not on file    Active member of club or organization: Not on file    Attends meetings of clubs or organizations: Not on file    Relationship status: Not on file  . Intimate partner violence:    Fear of current or ex partner: Not on file    Emotionally abused: Not on file    Physically abused: Not on file    Forced sexual activity: Not on file  Other Topics Concern  . Not on file  Social History Narrative  . Not on file    Current Outpatient Medications on File Prior to Visit  Medication Sig Dispense Refill  . calcium carbonate (OSCAL) 1500 (600 Ca) MG TABS tablet Take by mouth daily.     . carvedilol (COREG) 12.5 MG tablet TAKE 1 TABLET (12.5 MG TOTAL) BY MOUTH 2 (TWO) TIMES DAILY WITH A MEAL.  (Patient taking differently: Take 12.5 mg by mouth 2 (two) times daily with a meal. Take 1.5 tablets 2 times daily with meals) 180 tablet 3  . Cholecalciferol 1000 units capsule Take 1,000 Units by mouth daily.    . diphenhydramine-acetaminophen (TYLENOL PM) 25-500 MG TABS tablet Take 1 tablet by mouth at bedtime as needed.    . fexofenadine (ALLEGRA) 180 MG tablet Take 180 mg by mouth at bedtime.     . furosemide (LASIX) 20 MG tablet TAKE 1 TABLET (20 MG TOTAL) BY MOUTH EVERY OTHER DAY. 90 tablet 1  . lisinopril (PRINIVIL,ZESTRIL) 5 MG tablet TAKE 1 TABLET (5 MG TOTAL) BY MOUTH AT BEDTIME. REPORTED ON 11/11/2015 90 tablet 3  . meloxicam (MOBIC) 15 MG tablet Take 1 tablet (15 mg total) by mouth daily. 30 tablet 1  . pantoprazole (PROTONIX) 40 MG tablet Take 40 mg by mouth daily.    . simvastatin (ZOCOR) 10 MG tablet TAKE 1 TABLET (10 MG TOTAL) BY MOUTH DAILY AT 6 PM. 30 tablet 11  . spironolactone (ALDACTONE) 25 MG tablet TAKE 0.5 TABLETS (12.5 MG TOTAL) BY MOUTH DAILY. 45 tablet 3  . SUMAtriptan (IMITREX) 100 MG tablet TAKE 1 TABLET BY MOUTH EVERY DAY AS NEEDED FOR MIGRAINE. MAY REPEAT IN 2 HOURS IF HEADACHE 10 tablet 1  . zonisamide (ZONEGRAN) 100 MG capsule TAKE 1 CAPSULE BY MOUTH EVERY DAY 90 capsule 1   No current facility-administered medications on file prior to visit.     No Known Allergies  Family History  Problem Relation Age of Onset  . Hypertension Mother   . Stroke Mother   . Diabetes Mother   . Hypercholesterolemia Father   . Heart disease Father        MI  . Hypertension Father   . Stroke Father   . Breast cancer Neg Hx   . Colon cancer Neg Hx   . Thyroid disease Neg Hx     BP 116/72   Pulse 88   Ht 5\' 1"  (1.549 m)   Wt 160 lb (72.6 kg)   LMP 10/18/2012   SpO2 96%   BMI 30.23 kg/m     Review of Systems denies weight loss, headache, hoarseness, diplopia, chest pain, sob, diarrhea, hematuria, muscle weakness, edema, excessive diaphoresis, tremor, insomnia,  heat intolerance, easy bruising, and rhinorrhea.      Objective:   Physical Exam VS: see vs page GEN: no distress HEAD: head: no deformity eyes: no periorbital swelling, no proptosis external nose and ears are normal mouth: no lesion seen NECK: supple, thyroid  is not enlarged CHEST WALL: no deformity LUNGS: clear to auscultation CV: reg rate and rhythm, no murmur ABD: abdomen is soft, nontender.  no hepatosplenomegaly.  not distended.  no hernia MUSCULOSKELETAL: muscle bulk and strength are grossly normal.  no obvious joint swelling.  gait is normal and steady EXTEMITIES: no deformity.  no edema PULSES: no carotid bruit NEURO:  cn 2-12 grossly intact.   readily moves all 4's.  sensation is intact to touch on all 4's.  No tremor SKIN:  Normal texture and temperature.  No rash or suspicious lesion is visible.  Not diaphoretic NODES:  None palpable at the neck PSYCH: alert, well-oriented.  Does not appear anxious nor depressed.     Lab Results  Component Value Date   TSH <0.01 (L) 04/30/2018   T4TOTAL 7.3 09/02/2015   MRI of C-spine: no mention is made of the thyroid.  I have reviewed outside records, and summarized: Pt was noted to have suppressed TSH, and referred here.  Other probs addressed were dyslipidemia, foot pain, and GERD      Assessment & Plan:  Hyperthyroidism, new, uncertain etiology.  we discussed rx options.  she declines RAI   Patient Instructions  I have sent a prescription to your pharmacy, to slow the thyroid. If ever you have fever while taking methimazole, stop it and call us, even if the reason is obvious, because of the risk of a rare side-effect. Please come back for a follow-up appointment in 1 month.     Hyperthyroidism Hyperthyroidism is when the thyroid is too active (overactive). Your thyroid is a large gland that is located in your neck. The thyroid helps to control how your body uses food (metabolism). When your thyroid is overactive, it  produces too much of a hormone called thyroxine. What are the causes? Causes of hyperthyroidism may include:  Graves disease. This is when your immune system attacks the thyroid gland. This is the most common cause.  Inflammation of the thyroid gland.  Tumor in the thyroid gland or somewhere else.  Excessive use of thyroid medicines, including: ? Prescription thyroid supplement. ? Herbal supplements that mimic thyroid hormones.  Solid or fluid-filled lumps within your thyroid gland (thyroid nodules).  Excessive ingestion of iodine.  What increases the risk?  Being female.  Having a family history of thyroid conditions. What are the signs or symptoms? Signs and symptoms of hyperthyroidism may include:  Nervousness.  Inability to tolerate heat.  Unexplained weight loss.  Diarrhea.  Change in the texture of hair or skin.  Heart skipping beats or making extra beats.  Rapid heart rate.  Loss of menstruation.  Shaky hands.  Fatigue.  Restlessness.  Increased appetite.  Sleep problems.  Enlarged thyroid gland or nodules.  How is this diagnosed? Diagnosis of hyperthyroidism may include:  Medical history and physical exam.  Blood tests.  Ultrasound tests.  How is this treated? Treatment may include:  Medicines to control your thyroid.  Surgery to remove your thyroid.  Radiation pill.  Follow these instructions at home:  Take medicines only as directed by your health care provider.  Do not use any tobacco products, including cigarettes, chewing tobacco, or electronic cigarettes. If you need help quitting, ask your health care provider.  Do not exercise or do physical activity until your health care provider approves.  Keep all follow-up appointments as directed by your health care provider. This is important. Contact a health care provider if:  Your symptoms do not get better with  treatment.  You have fever.  You are taking thyroid  replacement medicine and you: ? Have depression. ? Feel mentally and physically slow. ? Have weight gain. Get help right away if:  You have decreased alertness or a change in your awareness.  You have abdominal pain.  You feel dizzy.  You have a rapid heartbeat.  You have an irregular heartbeat. This information is not intended to replace advice given to you by your health care provider. Make sure you discuss any questions you have with your health care provider. Document Released: 08/14/2005 Document Revised: 01/13/2016 Document Reviewed: 12/30/2013 Elsevier Interactive Patient Education  2018 [employer].

## 2018-06-21 DIAGNOSIS — E0590 Thyrotoxicosis, unspecified without thyrotoxic crisis or storm: Secondary | ICD-10-CM | POA: Insufficient documentation

## 2018-07-02 ENCOUNTER — Encounter: Payer: Self-pay | Admitting: Internal Medicine

## 2018-07-09 ENCOUNTER — Telehealth: Payer: Self-pay

## 2018-07-09 NOTE — Telephone Encounter (Signed)
I do not see where she has had a bone density scan done. OK to place order and schedule with her mammogram?

## 2018-07-09 NOTE — Telephone Encounter (Signed)
Copied from [place] [id number]. Topic: General - Other >> Jul 09, 2018  2:08 PM Janace Aris A wrote: Reason for CRM: pt called in wanting to know if she was still going to get a referral for a bone density scan per the letter she received, she says she called and sched her mammogram, but the scheduler wants to know if they should schedule the bone density along with that appt.  Please advise

## 2018-07-10 ENCOUNTER — Other Ambulatory Visit: Payer: Self-pay | Admitting: Internal Medicine

## 2018-07-10 DIAGNOSIS — E2839 Other primary ovarian failure: Secondary | ICD-10-CM

## 2018-07-10 NOTE — Telephone Encounter (Signed)
Patient is aware and would like to schedule her own mammo and bone density

## 2018-07-10 NOTE — Progress Notes (Signed)
Order placed for bone density  

## 2018-07-10 NOTE — Telephone Encounter (Signed)
I have placed order for bone density.  Ok to schedule.

## 2018-07-19 ENCOUNTER — Encounter: Payer: Self-pay | Admitting: Endocrinology

## 2018-07-19 ENCOUNTER — Ambulatory Visit (INDEPENDENT_AMBULATORY_CARE_PROVIDER_SITE_OTHER): Payer: Medicare Other | Admitting: Endocrinology

## 2018-07-19 VITALS — BP 122/70 | HR 70 | Ht 61.0 in | Wt 167.0 lb

## 2018-07-19 DIAGNOSIS — E0590 Thyrotoxicosis, unspecified without thyrotoxic crisis or storm: Secondary | ICD-10-CM

## 2018-07-19 LAB — T3, FREE: T3, Free: 2.9 pg/mL (ref 2.3–4.2)

## 2018-07-19 LAB — T4, FREE: Free T4: 0.5 ng/dL — ABNORMAL LOW (ref 0.60–1.60)

## 2018-07-19 LAB — TSH: TSH: 0.62 u[IU]/mL (ref 0.35–4.50)

## 2018-07-19 NOTE — Progress Notes (Signed)
Subjective:    [name], female    DOB: Jan 18, 1951, 67 y.o.   MRN: [id number]  HPI Pt returns for f/u of hyperthyroidism (dx'ed 2015, when she presented with SVT; she was rx'ed with an uncertain medication for a few months in 2015; she has never had thyroid imaging; she declines RAI).Since on the tapazole, pt states she feels not different, and well in general.   Past Medical History:  Diagnosis Date  . Allergy   . Cancer ([place])    basal cell  . Cardiomyopathy, nonischemic ([place])   . Carpal tunnel syndrome    bilateral  . Congestive heart failure ([place])    normalization of EF with medical therapy  . Dysrhythmia 08/31/2015   long RP tachycardia  . Female cystocele   . Hyperlipidemia   . Hypertension   . Migraines   . Overactive detrusor   . Personal history of colonic polyps   . PONV (postoperative nausea and vomiting)   . SUI (stress urinary incontinence, female)   . Uterovaginal prolapse   . Vaginal atrophy     Past Surgical History:  Procedure Laterality Date  . ANTERIOR AND POSTERIOR VAGINAL REPAIR W/ SACROSPINOUS LIGAMENT SUSPENSION  10/18/2012  . CARDIAC CATHETERIZATION N/A 11/26/2015   Procedure: Right/Left Heart Cath and Coronary Angiography;  Surgeon: Jolaine Artist, MD;  Location: [place] [facility];  Service: Cardiovascular;  Laterality: N/A;  . CHOLECYSTECTOMY  2006  . COLONOSCOPY WITH PROPOFOL N/A 03/19/2017   Procedure: COLONOSCOPY WITH PROPOFOL;  Surgeon: Lollie Sails, MD;  Location: [facility] ENDOSCOPY;  Service: Endoscopy;  Laterality: N/A;  . COLPOPEXY  10/18/2012  . CYSTOURETHROSCOPY  10/18/2012  . ESOPHAGOGASTRODUODENOSCOPY (EGD) WITH PROPOFOL N/A 10/03/2016   Procedure: ESOPHAGOGASTRODUODENOSCOPY (EGD) WITH PROPOFOL;  Surgeon: Lollie Sails, MD;  Location: [facility] ENDOSCOPY;  Service: Endoscopy;  Laterality: N/A;  . EYE SURGERY     cataract extration  . FINGER SURGERY    . HYSTEROSCOPY  2011, 10/2010  . TONSILLECTOMY    . TUBAL  LIGATION    . VAGINAL HYSTERECTOMY     midurethral sling    Social History   Socioeconomic History  . Marital status: Married    Spouse name: Not on file  . Number of children: 2  . Years of education: Not on file  . Highest education level: Not on file  Occupational History  . Not on file  Social Needs  . Financial resource strain: Not on file  . Food insecurity:    Worry: Not on file    Inability: Not on file  . Transportation needs:    Medical: Not on file    Non-medical: Not on file  Tobacco Use  . Smoking status: Never Smoker  . Smokeless tobacco: Never Used  Substance and Sexual Activity  . Alcohol use: Yes    Alcohol/week: 0.0 standard drinks    Comment: Socially  . Drug use: No  . Sexual activity: Not on file  Lifestyle  . Physical activity:    Days per week: Not on file    Minutes per session: Not on file  . Stress: Not on file  Relationships  . Social connections:    Talks on phone: Not on file    Gets together: Not on file    Attends religious service: Not on file    Active member of club or organization: Not on file    Attends meetings of clubs or organizations: Not on file  Relationship status: Not on file  . Intimate partner violence:    Fear of current or ex partner: Not on file    Emotionally abused: Not on file    Physically abused: Not on file    Forced sexual activity: Not on file  Other Topics Concern  . Not on file  Social History Narrative  . Not on file    Current Outpatient Medications on File Prior to Visit  Medication Sig Dispense Refill  . calcium carbonate (OSCAL) 1500 (600 Ca) MG TABS tablet Take by mouth daily.     . carvedilol (COREG) 12.5 MG tablet TAKE 1 TABLET (12.5 MG TOTAL) BY MOUTH 2 (TWO) TIMES DAILY WITH A MEAL. (Patient taking differently: Take 12.5 mg by mouth 2 (two) times daily with a meal. Take 1.5 tablets 2 times daily with meals) 180 tablet 3  . Cholecalciferol 1000 units capsule Take 1,000 Units by mouth  daily.    . diphenhydramine-acetaminophen (TYLENOL PM) 25-500 MG TABS tablet Take 1 tablet by mouth at bedtime as needed.    . fexofenadine (ALLEGRA) 180 MG tablet Take 180 mg by mouth at bedtime.     . furosemide (LASIX) 20 MG tablet TAKE 1 TABLET (20 MG TOTAL) BY MOUTH EVERY OTHER DAY. 90 tablet 1  . lisinopril (PRINIVIL,ZESTRIL) 5 MG tablet TAKE 1 TABLET (5 MG TOTAL) BY MOUTH AT BEDTIME. REPORTED ON 11/11/2015 90 tablet 3  . methimazole (TAPAZOLE) 10 MG tablet Take 1 tablet (10 mg total) by mouth 2 (two) times daily. 60 tablet 11  . pantoprazole (PROTONIX) 40 MG tablet Take 40 mg by mouth daily.    . simvastatin (ZOCOR) 10 MG tablet TAKE 1 TABLET (10 MG TOTAL) BY MOUTH DAILY AT 6 PM. 30 tablet 11  . spironolactone (ALDACTONE) 25 MG tablet TAKE 0.5 TABLETS (12.5 MG TOTAL) BY MOUTH DAILY. 45 tablet 3  . SUMAtriptan (IMITREX) 100 MG tablet TAKE 1 TABLET BY MOUTH EVERY DAY AS NEEDED FOR MIGRAINE. MAY REPEAT IN 2 HOURS IF HEADACHE 10 tablet 1  . zonisamide (ZONEGRAN) 100 MG capsule TAKE 1 CAPSULE BY MOUTH EVERY DAY 90 capsule 1   No current facility-administered medications on file prior to visit.     No Known Allergies  Family History  Problem Relation Age of Onset  . Hypertension Mother   . Stroke Mother   . Diabetes Mother   . Hypercholesterolemia Father   . Heart disease Father        MI  . Hypertension Father   . Stroke Father   . Breast cancer Neg Hx   . Colon cancer Neg Hx   . Thyroid disease Neg Hx     BP 122/70 (BP Location: Right Arm, Patient Position: Sitting, Cuff Size: Normal)   Pulse 70   Ht 5\' 1"  (1.549 m)   Wt 167 lb (75.8 kg)   LMP 10/18/2012   SpO2 95%   BMI 31.55 kg/m    Review of Systems Denies fever.     Objective:   Physical Exam VITAL SIGNS:  See vs page GENERAL: no distress NECK: thyroid is slightly and diffusely enlarged.  No thyroid nodule is palpable.  No palpable lymphadenopathy at the anterior neck.     Lab Results  Component Value Date     TSH 0.62 07/19/2018   T4TOTAL 7.3 09/02/2015      Assessment & Plan:  Hyperthyroidism: well-controlled Cardiac dysrhythmia: she needs to maintain euthyroidism  Patient Instructions  blood tests are requested for you  today.  We'll let you know about the results. If ever you have fever while taking methimazole, stop it and call us, even if the reason is obvious, because of the risk of a rare side-effect.  Please come back for a follow-up appointment in 2 months.

## 2018-07-19 NOTE — Patient Instructions (Addendum)
blood tests are requested for you today.  We'll let you know about the results.   If ever you have fever while taking methimazole, stop it and call us, even if the reason is obvious, because of the risk of a rare side-effect.  Please come back for a follow-up appointment in 2 months.   

## 2018-07-24 ENCOUNTER — Other Ambulatory Visit: Payer: Self-pay

## 2018-07-24 MED ORDER — MELOXICAM 15 MG PO TABS: 15.0000 mg | ORAL_TABLET | Freq: Every day | ORAL | 3 refills | Status: DC

## 2018-07-24 NOTE — Telephone Encounter (Signed)
Pharmacy refill request for Meloxicam.  Per Dr. Amalia Hailey verbal order, ok to refill  Script has been sent to pharmacy.

## 2018-08-09 ENCOUNTER — Other Ambulatory Visit (INDEPENDENT_AMBULATORY_CARE_PROVIDER_SITE_OTHER): Payer: Medicare Other

## 2018-08-09 DIAGNOSIS — R739 Hyperglycemia, unspecified: Secondary | ICD-10-CM

## 2018-08-09 DIAGNOSIS — E7800 Pure hypercholesterolemia, unspecified: Secondary | ICD-10-CM

## 2018-08-09 LAB — HEMOGLOBIN A1C: HEMOGLOBIN A1C: 6.1 % (ref 4.6–6.5)

## 2018-08-09 LAB — CBC WITH DIFFERENTIAL/PLATELET
BASOS PCT: 0.6 % (ref 0.0–3.0)
Basophils Absolute: 0 10*3/uL (ref 0.0–0.1)
EOS ABS: 0.3 10*3/uL (ref 0.0–0.7)
EOS PCT: 3.8 % (ref 0.0–5.0)
HCT: 35.1 % — ABNORMAL LOW (ref 36.0–46.0)
Hemoglobin: 11.3 g/dL — ABNORMAL LOW (ref 12.0–15.0)
LYMPHS ABS: 3 10*3/uL (ref 0.7–4.0)
Lymphocytes Relative: 36.4 % (ref 12.0–46.0)
MCHC: 32.3 g/dL (ref 30.0–36.0)
MCV: 84.2 fl (ref 78.0–100.0)
MONO ABS: 0.6 10*3/uL (ref 0.1–1.0)
Monocytes Relative: 6.8 % (ref 3.0–12.0)
NEUTROS ABS: 4.4 10*3/uL (ref 1.4–7.7)
NEUTROS PCT: 52.4 % (ref 43.0–77.0)
PLATELETS: 244 10*3/uL (ref 150.0–400.0)
RBC: 4.17 Mil/uL (ref 3.87–5.11)
RDW: 15.6 % — AB (ref 11.5–15.5)
WBC: 8.3 10*3/uL (ref 4.0–10.5)

## 2018-08-09 LAB — BASIC METABOLIC PANEL
BUN: 21 mg/dL (ref 6–23)
CHLORIDE: 108 meq/L (ref 96–112)
CO2: 26 mEq/L (ref 19–32)
Calcium: 9 mg/dL (ref 8.4–10.5)
Creatinine, Ser: 1.05 mg/dL (ref 0.40–1.20)
GFR: 55.79 mL/min — AB (ref >60.00)
Glucose, Bld: 105 mg/dL — ABNORMAL HIGH (ref 70–99)
Potassium: 4.5 mEq/L (ref 3.5–5.1)
SODIUM: 141 meq/L (ref 135–145)

## 2018-08-09 LAB — LIPID PANEL
CHOLESTEROL: 145 mg/dL (ref 0–200)
HDL: 48.9 mg/dL (ref >39.00)
LDL CALC: 84 mg/dL (ref 0–99)
NonHDL: 96.29
Total CHOL/HDL Ratio: 3
Triglycerides: 63 mg/dL (ref 0.0–149.0)
VLDL: 12.6 mg/dL (ref 0.0–40.0)

## 2018-08-09 LAB — HEPATIC FUNCTION PANEL
ALBUMIN: 3.9 g/dL (ref 3.5–5.2)
ALT: 14 U/L (ref 0–35)
AST: 20 U/L (ref 0–37)
Alkaline Phosphatase: 96 U/L (ref 39–117)
BILIRUBIN TOTAL: 0.3 mg/dL (ref 0.2–1.2)
Bilirubin, Direct: 0.1 mg/dL (ref 0.0–0.3)
Total Protein: 6.4 g/dL (ref 6.0–8.3)

## 2018-08-12 ENCOUNTER — Other Ambulatory Visit: Payer: Self-pay | Admitting: Internal Medicine

## 2018-08-12 DIAGNOSIS — D649 Anemia, unspecified: Secondary | ICD-10-CM

## 2018-08-12 NOTE — Progress Notes (Signed)
Orders placed for f/u labs.  

## 2018-08-13 ENCOUNTER — Ambulatory Visit (INDEPENDENT_AMBULATORY_CARE_PROVIDER_SITE_OTHER): Payer: Medicare Other | Admitting: Internal Medicine

## 2018-08-13 ENCOUNTER — Encounter: Payer: Self-pay | Admitting: Internal Medicine

## 2018-08-13 VITALS — BP 104/62 | HR 67 | Temp 97.6°F | Resp 16 | Ht 61.0 in | Wt 165.0 lb

## 2018-08-13 DIAGNOSIS — K219 Gastro-esophageal reflux disease without esophagitis: Secondary | ICD-10-CM

## 2018-08-13 DIAGNOSIS — I5022 Chronic systolic (congestive) heart failure: Secondary | ICD-10-CM

## 2018-08-13 DIAGNOSIS — M79622 Pain in left upper arm: Secondary | ICD-10-CM | POA: Diagnosis not present

## 2018-08-13 DIAGNOSIS — I471 Supraventricular tachycardia: Secondary | ICD-10-CM | POA: Diagnosis not present

## 2018-08-13 DIAGNOSIS — E7800 Pure hypercholesterolemia, unspecified: Secondary | ICD-10-CM | POA: Diagnosis not present

## 2018-08-13 DIAGNOSIS — I420 Dilated cardiomyopathy: Secondary | ICD-10-CM

## 2018-08-13 DIAGNOSIS — G5603 Carpal tunnel syndrome, bilateral upper limbs: Secondary | ICD-10-CM | POA: Diagnosis not present

## 2018-08-13 DIAGNOSIS — E0590 Thyrotoxicosis, unspecified without thyrotoxic crisis or storm: Secondary | ICD-10-CM

## 2018-08-13 DIAGNOSIS — Z0000 Encounter for general adult medical examination without abnormal findings: Secondary | ICD-10-CM

## 2018-08-13 DIAGNOSIS — D649 Anemia, unspecified: Secondary | ICD-10-CM

## 2018-08-13 DIAGNOSIS — M79621 Pain in right upper arm: Secondary | ICD-10-CM

## 2018-08-13 LAB — CBC WITH DIFFERENTIAL/PLATELET
Basophils Absolute: 0.1 10*3/uL (ref 0.0–0.1)
Basophils Relative: 0.5 % (ref 0.0–3.0)
Eosinophils Absolute: 0.2 10*3/uL (ref 0.0–0.7)
Eosinophils Relative: 2.1 % (ref 0.0–5.0)
HCT: 36.2 % (ref 36.0–46.0)
Hemoglobin: 11.6 g/dL — ABNORMAL LOW (ref 12.0–15.0)
Lymphocytes Relative: 27.5 % (ref 12.0–46.0)
Lymphs Abs: 2.5 10*3/uL (ref 0.7–4.0)
MCHC: 32.2 g/dL (ref 30.0–36.0)
MCV: 83.8 fl (ref 78.0–100.0)
MONOS PCT: 6.2 % (ref 3.0–12.0)
Monocytes Absolute: 0.6 10*3/uL (ref 0.1–1.0)
Neutro Abs: 5.9 10*3/uL (ref 1.4–7.7)
Neutrophils Relative %: 63.7 % (ref 43.0–77.0)
Platelets: 270 10*3/uL (ref 150.0–400.0)
RBC: 4.32 Mil/uL (ref 3.87–5.11)
RDW: 15.2 % (ref 11.5–15.5)
WBC: 9.3 10*3/uL (ref 4.0–10.5)

## 2018-08-13 LAB — IBC PANEL
Iron: 32 ug/dL — ABNORMAL LOW (ref 42–145)
Saturation Ratios: 6.7 % — ABNORMAL LOW (ref 20.0–50.0)
Transferrin: 342 mg/dL (ref 212.0–360.0)

## 2018-08-13 LAB — FERRITIN: Ferritin: 7.2 ng/mL — ABNORMAL LOW (ref 10.0–291.0)

## 2018-08-13 LAB — SEDIMENTATION RATE: Sed Rate: 22 mm/hr (ref 0–30)

## 2018-08-13 LAB — CK: Total CK: 107 U/L (ref 7–177)

## 2018-08-13 NOTE — Assessment & Plan Note (Signed)
Physical today 08/13/18.  PAP 04/09/15 - negative with negative HPV.  Mammogram scheduled 08/15/18.  Colonoscopy 7//23/18 - fair prep and results as outlined.  Recommended f/u in 5 years.

## 2018-08-13 NOTE — Progress Notes (Signed)
[name], female   DOB: Jan 22, 1951, 67 y.o.   MRN: [id number]   Subjective:    [name], female    DOB: Feb 07, 1951, 67 y.o.   MRN: [id number]  HPI  Patient with past history of cardiomyopathy, hypercholesterolemia and hypertension.  She comes in today to follow up on these issues as well as for a complete physical exam.  Has seen Dr Loanne Drilling for hyperthyroidism.  Last evaluated 07/19/18.  On tapazole.  No chest pain.  Breathing stable.  Walking on the treadmill.  No cough or congestion.  Does report bilateral upper shoulder pain and pain to mid arm.  Persistent.  Left thumb - trigger.  No acid reflux.  No abdominal pain.  Bowels moving.  No urine change.     Past Medical History:  Diagnosis Date  . Allergy   . Cancer ([place])    basal cell  . Cardiomyopathy, nonischemic ([place])   . Carpal tunnel syndrome    bilateral  . Congestive heart failure ([place])    normalization of EF with medical therapy  . Dysrhythmia 08/31/2015   long RP tachycardia  . Female cystocele   . Hyperlipidemia   . Hypertension   . Migraines   . Overactive detrusor   . Personal history of colonic polyps   . PONV (postoperative nausea and vomiting)   . SUI (stress urinary incontinence, female)   . Uterovaginal prolapse   . Vaginal atrophy    Past Surgical History:  Procedure Laterality Date  . ANTERIOR AND POSTERIOR VAGINAL REPAIR W/ SACROSPINOUS LIGAMENT SUSPENSION  10/18/2012  . CARDIAC CATHETERIZATION N/A 11/26/2015   Procedure: Right/Left Heart Cath and Coronary Angiography;  Surgeon: Jolaine Artist, MD;  Location: [facility];  Service: Cardiovascular;  Laterality: N/A;  . CHOLECYSTECTOMY  2006  . COLONOSCOPY WITH PROPOFOL N/A 03/19/2017   Procedure: COLONOSCOPY WITH PROPOFOL;  Surgeon: Lollie Sails, MD;  Location: [facility] ENDOSCOPY;  Service: Endoscopy;  Laterality: N/A;  . COLPOPEXY  10/18/2012  . CYSTOURETHROSCOPY  10/18/2012  .  ESOPHAGOGASTRODUODENOSCOPY (EGD) WITH PROPOFOL N/A 10/03/2016   Procedure: ESOPHAGOGASTRODUODENOSCOPY (EGD) WITH PROPOFOL;  Surgeon: Lollie Sails, MD;  Location: [facility] ENDOSCOPY;  Service: Endoscopy;  Laterality: N/A;  . EYE SURGERY     cataract extration  . FINGER SURGERY    . HYSTEROSCOPY  2011, 10/2010  . OOPHORECTOMY    . TONSILLECTOMY    . TUBAL LIGATION    . VAGINAL HYSTERECTOMY     midurethral sling   Family History  Problem Relation Age of Onset  . Hypertension Mother   . Stroke Mother   . Diabetes Mother   . Hypercholesterolemia Father   . Heart disease Father        MI  . Hypertension Father   . Stroke Father   . Breast cancer Neg Hx   . Colon cancer Neg Hx   . Thyroid disease Neg Hx    Social History   Socioeconomic History  . Marital status: Married    Spouse name: Not on file  . Number of children: 2  . Years of education: Not on file  . Highest education level: Not on file  Occupational History  . Not on file  Social Needs  . Financial resource strain: Not on file  . Food insecurity:    Worry: Not on file    Inability: Not on file  . Transportation needs:    Medical: Not on file  Non-medical: Not on file  Tobacco Use  . Smoking status: Never Smoker  . Smokeless tobacco: Never Used  Substance and Sexual Activity  . Alcohol use: Yes    Alcohol/week: 0.0 standard drinks    Comment: Socially  . Drug use: No  . Sexual activity: Not on file  Lifestyle  . Physical activity:    Days per week: Not on file    Minutes per session: Not on file  . Stress: Not on file  Relationships  . Social connections:    Talks on phone: Not on file    Gets together: Not on file    Attends religious service: Not on file    Active member of club or organization: Not on file    Attends meetings of clubs or organizations: Not on file    Relationship status: Not on file  Other Topics Concern  . Not on file  Social History Narrative  . Not on file    Outpatient  Encounter Medications as of 08/13/2018  Medication Sig  . pantoprazole (PROTONIX) 40 MG tablet Take 40 mg by mouth daily.  . calcium carbonate (OSCAL) 1500 (600 Ca) MG TABS tablet Take by mouth daily.   . carvedilol (COREG) 12.5 MG tablet TAKE 1 TABLET (12.5 MG TOTAL) BY MOUTH 2 (TWO) TIMES DAILY WITH A MEAL. (Patient taking differently: Take 12.5 mg by mouth 2 (two) times daily with a meal. Take 1.5 tablets 2 times daily with meals)  . Cholecalciferol 1000 units capsule Take 1,000 Units by mouth daily.  . diphenhydramine-acetaminophen (TYLENOL PM) 25-500 MG TABS tablet Take 1 tablet by mouth at bedtime as needed.  . fexofenadine (ALLEGRA) 180 MG tablet Take 180 mg by mouth at bedtime.   . furosemide (LASIX) 20 MG tablet TAKE 1 TABLET (20 MG TOTAL) BY MOUTH EVERY OTHER DAY.  Marland Kitchen lisinopril (PRINIVIL,ZESTRIL) 5 MG tablet TAKE 1 TABLET (5 MG TOTAL) BY MOUTH AT BEDTIME. REPORTED ON 11/11/2015  . methimazole (TAPAZOLE) 10 MG tablet Take 1 tablet (10 mg total) by mouth 2 (two) times daily. (Patient taking differently: Take 10 mg by mouth daily. )  . simvastatin (ZOCOR) 10 MG tablet TAKE 1 TABLET (10 MG TOTAL) BY MOUTH DAILY AT 6 PM.  . spironolactone (ALDACTONE) 25 MG tablet TAKE 0.5 TABLETS (12.5 MG TOTAL) BY MOUTH DAILY.  . SUMAtriptan (IMITREX) 100 MG tablet TAKE 1 TABLET BY MOUTH EVERY DAY AS NEEDED FOR MIGRAINE. MAY REPEAT IN 2 HOURS IF HEADACHE  . zonisamide (ZONEGRAN) 100 MG capsule TAKE 1 CAPSULE BY MOUTH EVERY DAY  . [DISCONTINUED] meloxicam (MOBIC) 15 MG tablet Take 1 tablet (15 mg total) by mouth daily.   No facility-administered encounter medications on file as of 08/13/2018.     Review of Systems  Constitutional: Negative for appetite change and unexpected weight change.  HENT: Negative for congestion and sinus pressure.   Eyes: Negative for pain and visual disturbance.  Respiratory: Negative for cough, chest tightness and shortness of breath.   Cardiovascular: Negative for chest pain,  palpitations and leg swelling.  Gastrointestinal: Negative for abdominal pain, diarrhea, nausea and vomiting.  Genitourinary: Negative for difficulty urinating and dysuria.  Musculoskeletal: Negative for back pain and joint swelling.       Bilateral shoulder and upper arm pain.    Skin: Negative for color change and rash.  Neurological: Negative for dizziness, light-headedness and headaches.  Hematological: Negative for adenopathy. Does not bruise/bleed easily.  Psychiatric/Behavioral: Negative for agitation and dysphoric mood.  Objective:    Physical Exam Constitutional:      General: She is not in acute distress.    Appearance: Normal appearance. She is well-developed.  HENT:     Nose: Nose normal. No congestion.     Mouth/Throat:     Pharynx: No oropharyngeal exudate or posterior oropharyngeal erythema.  Eyes:     General: No scleral icterus.       Right eye: No discharge.        Left eye: No discharge.  Neck:     Musculoskeletal: Neck supple. No muscular tenderness.     Thyroid: No thyromegaly.  Cardiovascular:     Rate and Rhythm: Normal rate and regular rhythm.  Pulmonary:     Effort: No tachypnea, accessory muscle usage or respiratory distress.     Breath sounds: Normal breath sounds. No decreased breath sounds or wheezing.  Chest:     Breasts:        Right: No inverted nipple, mass, nipple discharge or tenderness (no axillary adenopathy).        Left: No inverted nipple, mass, nipple discharge or tenderness (no axilarry adenopathy).  Abdominal:     General: Bowel sounds are normal.     Palpations: Abdomen is soft.     Tenderness: There is no abdominal tenderness.  Musculoskeletal:        General: No swelling or tenderness.  Lymphadenopathy:     Cervical: No cervical adenopathy.  Skin:    Findings: No erythema or rash.  Neurological:     Mental Status: She is alert and oriented to person, place, and time.  Psychiatric:        Mood and Affect: Mood  normal.        Behavior: Behavior normal.     BP 104/62 (BP Location: Left Arm, Patient Position: Sitting, Cuff Size: Normal)   Pulse 67   Temp 97.6 F (36.4 C) (Oral)   Resp 16   Ht 5' 1" (1.549 m)   Wt 165 lb (74.8 kg)   LMP 10/18/2012   SpO2 99%   BMI 31.18 kg/m  Wt Readings from Last 3 Encounters:  08/13/18 165 lb (74.8 kg)  07/19/18 167 lb (75.8 kg)  06/18/18 160 lb (72.6 kg)     Lab Results  Component Value Date   WBC 9.3 08/13/2018   HGB 11.6 (L) 08/13/2018   HCT 36.2 08/13/2018   PLT 270.0 08/13/2018   GLUCOSE 105 (H) 08/09/2018   CHOL 145 08/09/2018   TRIG 63.0 08/09/2018   HDL 48.90 08/09/2018   LDLCALC 84 08/09/2018   ALT 14 08/09/2018   AST 20 08/09/2018   NA 141 08/09/2018   K 4.5 08/09/2018   CL 108 08/09/2018   CREATININE 1.05 08/09/2018   BUN 21 08/09/2018   CO2 26 08/09/2018   TSH 0.62 07/19/2018   INR 0.98 11/26/2015   HGBA1C 6.1 08/09/2018       Assessment & Plan:   Problem List Items Addressed This Visit    Bilateral carpal tunnel syndrome    S/p right carpal tunnel release.  With left CTS.  Left trigger finger.  Splint.  Follow.        Chronic systolic heart failure (HCC) (Chronic)    Followed by cardiology.  Doing well on current regimen.  Follow.        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HCC) (Chronic)    Followed by cardiology.  Doing well on current regimen.        GERD (  gastroesophageal reflux disease)    Controlled on current regimen.        Health care maintenance    Physical today 08/13/18.  PAP 04/09/15 - negative with negative HPV.  Mammogram scheduled 08/15/18.  Colonoscopy 7//23/18 - fair prep and results as outlined.  Recommended f/u in 5 years.        Hypercholesterolemia    On simvastatin.  Low cholesterol diet and exercise.  Follow lipid panel and liver function tests.        Hyperthyroidism    Seeing endocrinology.  On tapazole.        SVT (supraventricular tachycardia) (HCC) (Chronic)    On  carvedilol.  Stable on current regimen.         Other Visit Diagnoses    Pain in both upper arms    -  Primary   Pain in both upper arms.  No significant abnormality.  Check esr and CK.     Relevant Orders   Sedimentation rate (Completed)   CK (Creatine Kinase) (Completed)   Anemia, unspecified type       Recheck cbc and iron studies.         Einar Pheasant, MD

## 2018-08-14 ENCOUNTER — Telehealth: Payer: Self-pay | Admitting: Radiology

## 2018-08-14 ENCOUNTER — Other Ambulatory Visit: Payer: Self-pay | Admitting: Internal Medicine

## 2018-08-14 DIAGNOSIS — D649 Anemia, unspecified: Secondary | ICD-10-CM

## 2018-08-14 NOTE — Telephone Encounter (Signed)
She was here and had her labs drawn at her visit.  She will not need labs and should not come in for lab appt since had these drawn already.

## 2018-08-14 NOTE — Telephone Encounter (Signed)
Pt coming in for labs tomorrow, please place future orders. Thank you.  

## 2018-08-14 NOTE — Progress Notes (Signed)
Order placed for f/u labs.  

## 2018-08-15 ENCOUNTER — Ambulatory Visit
Admission: RE | Admit: 2018-08-15 | Discharge: 2018-08-15 | Disposition: A | Discharge status: Home / Self Care | Payer: Medicare Other | Source: Ambulatory Visit | Attending: Internal Medicine | Admitting: Internal Medicine

## 2018-08-15 ENCOUNTER — Other Ambulatory Visit: Payer: Medicare Other

## 2018-08-15 DIAGNOSIS — Z1231 Encounter for screening mammogram for malignant neoplasm of breast: Secondary | ICD-10-CM | POA: Diagnosis not present

## 2018-08-15 DIAGNOSIS — Z1239 Encounter for other screening for malignant neoplasm of breast: Secondary | ICD-10-CM

## 2018-08-15 NOTE — Telephone Encounter (Signed)
Pt appt.cancelled

## 2018-08-18 ENCOUNTER — Encounter: Payer: Self-pay | Admitting: Internal Medicine

## 2018-08-18 NOTE — Assessment & Plan Note (Signed)
On simvastatin.  Low cholesterol diet and exercise.  Follow lipid panel and liver function tests.   

## 2018-08-18 NOTE — Assessment & Plan Note (Signed)
Followed by cardiology.  Doing well on current regimen.   

## 2018-08-18 NOTE — Assessment & Plan Note (Signed)
Seeing endocrinology.  On tapazole.

## 2018-08-18 NOTE — Assessment & Plan Note (Signed)
S/p right carpal tunnel release.  With left CTS.  Left trigger finger.  Splint.  Follow.

## 2018-08-18 NOTE — Assessment & Plan Note (Signed)
Followed by cardiology.  Doing well on current regimen.  Follow.   

## 2018-08-18 NOTE — Assessment & Plan Note (Signed)
Controlled on current regimen.   

## 2018-08-18 NOTE — Assessment & Plan Note (Signed)
On carvedilol.  Stable on current regimen.

## 2018-09-02 ENCOUNTER — Ambulatory Visit
Admission: RE | Admit: 2018-09-02 | Discharge: 2018-09-02 | Disposition: A | Discharge status: Home / Self Care | Payer: Medicare Other | Source: Ambulatory Visit | Attending: Internal Medicine | Admitting: Internal Medicine

## 2018-09-02 DIAGNOSIS — E2839 Other primary ovarian failure: Secondary | ICD-10-CM | POA: Insufficient documentation

## 2018-09-02 DIAGNOSIS — M85852 Other specified disorders of bone density and structure, left thigh: Secondary | ICD-10-CM | POA: Diagnosis not present

## 2018-09-02 DIAGNOSIS — Z780 Asymptomatic menopausal state: Secondary | ICD-10-CM | POA: Diagnosis not present

## 2018-09-06 DIAGNOSIS — M65312 Trigger thumb, left thumb: Secondary | ICD-10-CM | POA: Diagnosis not present

## 2018-09-09 ENCOUNTER — Ambulatory Visit (INDEPENDENT_AMBULATORY_CARE_PROVIDER_SITE_OTHER): Payer: Medicare Other | Admitting: Family Medicine

## 2018-09-09 ENCOUNTER — Encounter: Payer: Self-pay | Admitting: Family Medicine

## 2018-09-09 ENCOUNTER — Telehealth: Payer: Self-pay

## 2018-09-09 VITALS — BP 122/78 | HR 79 | Temp 98.5°F | Resp 16 | Ht 61.0 in | Wt 165.4 lb

## 2018-09-09 DIAGNOSIS — N390 Urinary tract infection, site not specified: Secondary | ICD-10-CM | POA: Diagnosis not present

## 2018-09-09 DIAGNOSIS — M545 Low back pain, unspecified: Secondary | ICD-10-CM

## 2018-09-09 DIAGNOSIS — R300 Dysuria: Secondary | ICD-10-CM | POA: Diagnosis not present

## 2018-09-09 DIAGNOSIS — R350 Frequency of micturition: Secondary | ICD-10-CM | POA: Diagnosis not present

## 2018-09-09 LAB — POCT URINALYSIS DIPSTICK
Bilirubin, UA: NEGATIVE
Blood, UA: 10
GLUCOSE UA: NEGATIVE
Ketones, UA: NEGATIVE
Nitrite, UA: NEGATIVE
Protein, UA: POSITIVE — AB
Spec Grav, UA: >=1.03 — AB (ref 1.010–1.025)
Urobilinogen, UA: 0.2 E.U./dL
pH, UA: 6 (ref 5.0–8.0)

## 2018-09-09 MED ORDER — CEFDINIR 300 MG PO CAPS: 300.0000 mg | ORAL_CAPSULE | Freq: Two times a day (BID) | ORAL | 0 refills | Status: AC

## 2018-09-09 NOTE — Telephone Encounter (Signed)
Copied from [place] [id number]. Topic: General - Inquiry >> Sep 09, 2018  9:46 AM Conception Chancy, NT wrote: Reason for CRM: patient wants to check with Dr. Nicki Reaper to see if she can stop by and give a specimen for a UTI, she states she has back pain and frequent urination with not a lot of urine. Please advise.

## 2018-09-09 NOTE — Progress Notes (Signed)
Subjective:    [name], female    DOB: 04-Jan-1951, 68 y.o.   MRN: [id number]  HPI   Presents to clinic c/o possible UTI.   Has had low back ache, burning and pressure with urination for 2-3 days.  Patient states her UTIs always begin with the same symptoms.  Denies fever or chills.  Denies nausea/vomiting or diarrhea.  Patient has been trying to increase her water intake and is also been trying to increase cranberry juice.  Patient Active Problem List   Diagnosis Date Noted  . Hyperthyroidism 06/21/2018  . Pain of right heel 05/04/2018  . Snoring 01/06/2018  . Chronic bronchitis ([place]) 12/15/2017  . Impingement syndrome of shoulder region 09/17/2017  . GERD (gastroesophageal reflux disease) 06/24/2017  . Bilateral carpal tunnel syndrome 11/29/2016  . Carpal tunnel syndrome 11/08/2016  . Sinusitis 08/13/2016  . Epigastric abdominal pain 05/04/2016  . Left sided chest pain 12/05/2015  . 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[place])   . NSVT (nonsustained ventricular tachycardia) ([place])   . Environmental allergies 09/29/2015  . Chronic systolic heart failure ([place]) 09/22/2015  . SVT (supraventricular tachycardia) ([place]) 09/01/2015  . Chronic migraine without aura without status migrainosus, not intractable 06/24/2015  . Health care maintenance 04/13/2015  . History of migraine headaches 11/11/2012  . Hypercholesterolemia 11/11/2012  . History of colonic polyps 11/11/2012  . Migraines 10/02/2012  . 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 SUI (stress urinary incontinence, female) 10/02/2012  . Postop check 10/02/2012  . Cystocele 09/23/2012  . Incomplete emptying of bladder 09/23/2012   Social History   Tobacco Use  . Smoking status: Never Smoker  . Smokeless tobacco: Never Used  Substance Use Topics  . Alcohol use: Yes    Alcohol/week: 0.0 standard drinks    Comment: Socially   Review of Systems  Constitutional: Negative for chills, fatigue and fever.  HENT:  Negative for congestion, ear pain, sinus pain and sore throat.   Eyes: Negative.   Respiratory: Negative for cough, shortness of breath and wheezing.   Cardiovascular: Negative for chest pain, palpitations and leg swelling.  Gastrointestinal: Negative for abdominal pain, diarrhea, nausea and vomiting.  Genitourinary: +dysuria, frequency and urgency.  Musculoskeletal: Negative for arthralgias and myalgias.  Skin: Negative for color change, pallor and rash.  Neurological: Negative for syncope, light-headedness and headaches.  Psychiatric/Behavioral: The patient is not nervous/anxious.       Objective:   Physical Exam  Constitutional: She appears well-developed and well-nourished. No distress.  HENT:  Head: Normocephalic and atraumatic.  Eyes: EOM are normal. No scleral icterus.  Neck: Normal range of motion. Neck supple. No tracheal deviation present.  Cardiovascular: Normal rate, regular rhythm and normal heart sounds.  Pulmonary/Chest: Effort normal and breath sounds normal. No respiratory distress. She has no wheezes. She has no rales.  Abdominal: Soft. Bowel sounds are normal. There is no tenderness.  Neurological: She is alert and oriented to person, place, and time.  Gait normal  Skin: Skin is warm and dry. No pallor.  Psychiatric: She has a normal mood and affect. Her behavior is normal.  Nursing note and vitals reviewed.       Vitals:   09/09/18 1616  BP: 122/78  Pulse: 79  Resp: 16  Temp: 98.5 F (36.9 C)  SpO2: 98%     Assessment & Plan:   UTI, urinary frequency, low backache, dysuria - patient's urinalysis is positive for leukocytes and protein and also is foul-smelling.  We will treat with [facility]  and urine culture sent to lab. Patient advised to increase water intake, avoid excess sugar and caffeine, wear cotton underwear and avoid holding urine for extended periods of time. Also advised the some cranberry juice is OK, but cranberry juice sold at the grocery  stores is often full of sugar, so water is better.   Patient will keep regularly scheduled follow-up with PCP as planned.  Advised return to clinic sooner if any issues arise

## 2018-09-09 NOTE — Progress Notes (Signed)
Cardiology Office Note  Date:  09/10/2018   ID:  Kristi Greer, Kristi Greer 19-Jun-1951, MRN [id number]  PCP:  Einar Pheasant, MD   Chief Complaint  Patient presents with  . Other    referred by [facility] for HF. Patient denies chest pain, SOB and Swelling at this time. Meds reviewed verbally with patient. Patient has seen [facility].     HPI:  Kristi Greer is a 68 y/o woman with h/o obesity,  migraines  HTN  Nonischemic cardiomyopathy Nonsustained VT Ejection fraction 15 to 60% April 2018 Nonobstructive coronary disease on catheterization Who presents for follow-up of her cardiomyopathy  On today's visit she reports having UTI Started ABX for 5 days, started last night She has been having some orthostasis symptoms Initial nurse systolic pressure 98 595 systolic on my recheck  Otherwise feels well with no complaints Denies any tachycardia or palpitations Reports that she was asymptomatic in the past even when she had short runs of nonsustained VT or SVT She has been watching her Fitbit.  Denies any recent runs of tachycardia  EKG personally reviewed by myself on todays visit Shows normal sinus rhythm rate 70 bpm no significant ST or T wave changes  Extensive review of recent chart notes for today's visit Other past medical history reviewed with her on today's visit January 2017 admitted at [facility] with acute CHF and SVT (No palpitations at the time) Received adenosine but didn't break.  converted spontaneously.   Echo EF 35-40%.    hyperthyroidism.  tapazole and a b-blocker.   f/u with Endo and they stopped the tapazole saying her thyroid wasn't the problem  cath 11/26/15 with NICM   Echo 4/18 EF 55-60% 30-d monitor 5/19:   - Normal sinus rhythm    - One 13-beat run NSVT on 5/12   - No other high-grade arrhythmias or blocks.    30-d monitor brief nonsustained VT for which she was asymptomatic. She has seen Dr. Rayann Heman. They discussed possible EPS but with normal  EF have opted for medical therapy with carvedilol unless EF drops or has recurrent arrhythmias or worsening symptoms   episode on her FitBit on Saturday with HR up to 190 briefly - no symptoms with this.   Echo today EF 55% no RWMA Personally reviewed  Previous studies: cMRI 03/2016 LVEF 48% RV normal. No evidence of infiltrative disease.   On 10/11/15 had Myoview EF 30% No ischemia or scar.  Event monitor 4/17: NSR one 7 beat NSVT. No SVT  Echo 01/31/16 LVEF 40-45%, Grade 1 DD, Normal RV   1. Normal coronary arteries 2. NICM EF 30-35% suspect tachycardia-induced  Previous 30-day monitor in May 2019 One 13-beat run NSVT on 5/12   PMH:   has a past medical history of Allergy, Cancer ([place]), Cardiomyopathy, nonischemic ([place]), Carpal tunnel syndrome, Congestive heart failure ([place]), Dysrhythmia (08/31/2015), Female cystocele, Hyperlipidemia, Hypertension, Migraines, Overactive detrusor, Personal history of colonic polyps, PONV (postoperative nausea and vomiting), SUI (stress urinary incontinence, female), Uterovaginal prolapse, and Vaginal atrophy.  PSH:    Past Surgical History:  Procedure Laterality Date  . ANTERIOR AND POSTERIOR VAGINAL REPAIR W/ SACROSPINOUS LIGAMENT SUSPENSION  10/18/2012  . CARDIAC CATHETERIZATION N/A 11/26/2015   Procedure: Right/Left Heart Cath and Coronary Angiography;  Surgeon: Jolaine Artist, MD;  Location: [place] [facility];  Service: Cardiovascular;  Laterality: N/A;  . CHOLECYSTECTOMY  2006  . COLONOSCOPY WITH PROPOFOL N/A 03/19/2017   Procedure: COLONOSCOPY WITH PROPOFOL;  Surgeon: Lollie Sails, MD;  Location: [facility] ENDOSCOPY;  Service: Endoscopy;  Laterality: N/A;  . COLPOPEXY  10/18/2012  . CYSTOURETHROSCOPY  10/18/2012  . ESOPHAGOGASTRODUODENOSCOPY (EGD) WITH PROPOFOL N/A 10/03/2016   Procedure: ESOPHAGOGASTRODUODENOSCOPY (EGD) WITH PROPOFOL;  Surgeon: Lollie Sails, MD;  Location: [facility] ENDOSCOPY;  Service: Endoscopy;  Laterality: N/A;  .  EYE SURGERY     cataract extration  . FINGER SURGERY    . HYSTEROSCOPY  2011, 10/2010  . OOPHORECTOMY    . TONSILLECTOMY    . TUBAL LIGATION    . VAGINAL HYSTERECTOMY     midurethral sling    Current Outpatient Medications  Medication Sig Dispense Refill  . calcium carbonate (OSCAL) 1500 (600 Ca) MG TABS tablet Take by mouth daily.     . carvedilol (COREG) 12.5 MG tablet Take 12.5 mg by mouth 2 (two) times daily with a meal.    . cefdinir (OMNICEF) 300 MG capsule Take 1 capsule (300 mg total) by mouth 2 (two) times daily for 5 days. 10 capsule 0  . Cholecalciferol 1000 units capsule Take 1,000 Units by mouth daily.    . diphenhydramine-acetaminophen (TYLENOL PM) 25-500 MG TABS tablet Take 1 tablet by mouth at bedtime as needed.    . ferrous sulfate 325 (65 FE) MG tablet Take 325 mg by mouth daily with breakfast.    . fexofenadine (ALLEGRA) 180 MG tablet Take 180 mg by mouth at bedtime.     . furosemide (LASIX) 20 MG tablet TAKE 1 TABLET (20 MG TOTAL) BY MOUTH EVERY OTHER DAY. 90 tablet 1  . lisinopril (PRINIVIL,ZESTRIL) 5 MG tablet TAKE 1 TABLET (5 MG TOTAL) BY MOUTH AT BEDTIME. REPORTED ON 11/11/2015 90 tablet 3  . methimazole (TAPAZOLE) 10 MG tablet Take 1 tablet (10 mg total) by mouth 2 (two) times daily. (Patient taking differently: Take 10 mg by mouth daily. ) 60 tablet 11  . pantoprazole (PROTONIX) 40 MG tablet Take 40 mg by mouth daily.    . simvastatin (ZOCOR) 10 MG tablet TAKE 1 TABLET (10 MG TOTAL) BY MOUTH DAILY AT 6 PM. 30 tablet 11  . spironolactone (ALDACTONE) 25 MG tablet TAKE 0.5 TABLETS (12.5 MG TOTAL) BY MOUTH DAILY. 45 tablet 3  . SUMAtriptan (IMITREX) 100 MG tablet TAKE 1 TABLET BY MOUTH EVERY DAY AS NEEDED FOR MIGRAINE. MAY REPEAT IN 2 HOURS IF HEADACHE 10 tablet 1  . zonisamide (ZONEGRAN) 100 MG capsule TAKE 1 CAPSULE BY MOUTH EVERY DAY 90 capsule 1   No current facility-administered medications for this visit.      Allergies:   Patient has no known allergies.    Social History:  The patient  reports that she has never smoked. She has never used smokeless tobacco. She reports current alcohol use. She reports that she does not use drugs.   Family History:   family history includes Diabetes in her mother; Heart disease in her father; Hypercholesterolemia in her father; Hypertension in her father and mother; Stroke in her father and mother.    Review of Systems: ROS   PHYSICAL EXAM: VS:  BP 98/60 (BP Location: Left Arm, Patient Position: Sitting, Cuff Size: Normal)   Pulse 70   Ht 5' (1.524 m)   Wt 165 lb (74.8 kg)   LMP 10/18/2012   BMI 32.22 kg/m  , BMI Body mass index is 32.22 kg/m. GEN: Well nourished, well developed, in no acute distress  HEENT: normal  Neck: no JVD, carotid bruits, or masses Cardiac: RRR; no murmurs, rubs, or gallops,no edema  Respiratory:  clear to auscultation bilaterally, normal work of breathing GI: soft, nontender, nondistended, + BS MS: no deformity or atrophy  Skin: warm and dry, no rash Neuro:  Strength and sensation are intact Psych: euthymic mood, full affect  Recent Labs: 03/25/2018: Magnesium 2.2 07/19/2018: TSH 0.62 08/09/2018: ALT 14; BUN 21; Creatinine, Ser 1.05; Potassium 4.5; Sodium 141 08/13/2018: Hemoglobin 11.6; Platelets 270.0    Lipid Panel Lab Results  Component Value Date   CHOL 145 08/09/2018   HDL 48.90 08/09/2018   LDLCALC 84 08/09/2018   TRIG 63.0 08/09/2018      Wt Readings from Last 3 Encounters:  09/10/18 165 lb (74.8 kg)  09/09/18 165 lb 6.4 oz (75 kg)  08/13/18 165 lb (74.8 kg)       ASSESSMENT AND PLAN:  NSVT (nonsustained ventricular tachycardia) (HCC) Continue current medications.  Denies any tachycardia or palpitations She reports she is taking carvedilol 1-1/2 of the 12.5 mg pills twice daily Blood pressure running low in the setting of UTI Will push fluids  NICM (nonischemic cardiomyopathy) ([place]) Most recent echocardiogram showing normalized ejection  fraction Having orthostasis symptoms in the setting of UTI Drink more fluids but neck several days hold spironolactone this week while on antibiotics then restart  Chronic systolic heart failure ([place]) - Plan: EKG 12-Lead Appears relatively euvolemic if not dry Having orthostasis She will increase fluids this week in the setting of UTI  SVT (supraventricular tachycardia) (HCC) Denies any tachycardia or palpitations We did discuss other types of monitors if she has recurrent symptoms  UTI Being treated by primary care, several days of antibiotics May be causing blood pressure to run low also not drinking much fluids  Disposition:   F/U  12 months   Total encounter time more than 25 minutes  Greater than 50% was spent in counseling and coordination of care with the patient    Orders Placed This Encounter  Procedures  . EKG 12-Lead     Signed, Esmond Plants, M.D., Ph.D. 09/10/2018  [place], [place]

## 2018-09-09 NOTE — Telephone Encounter (Signed)
Pt is coming in at 4:00 for appt with Lauren.

## 2018-09-09 NOTE — Telephone Encounter (Signed)
Pt called c/o frequent urination and back pain since Friday. NO pain, fever, chills, etc. Advised pt that we normally do not place orders for urine to be dropped off. Scheduled patient for this afternoon with Lauren. Advised I would let her know after I spoke with Dr. Nicki Reaper.

## 2018-09-10 ENCOUNTER — Encounter: Payer: Self-pay | Admitting: Cardiovascular Disease

## 2018-09-10 ENCOUNTER — Ambulatory Visit (INDEPENDENT_AMBULATORY_CARE_PROVIDER_SITE_OTHER): Payer: Medicare Other | Admitting: Cardiovascular Disease

## 2018-09-10 VITALS — BP 105/60 | HR 70 | Ht 60.0 in | Wt 165.0 lb

## 2018-09-10 DIAGNOSIS — I471 Supraventricular tachycardia, unspecified: Secondary | ICD-10-CM

## 2018-09-10 DIAGNOSIS — I428 Other cardiomyopathies: Secondary | ICD-10-CM | POA: Diagnosis not present

## 2018-09-10 DIAGNOSIS — I5022 Chronic systolic (congestive) heart failure: Secondary | ICD-10-CM

## 2018-09-10 DIAGNOSIS — I4729 Other ventricular tachycardia: Secondary | ICD-10-CM

## 2018-09-10 DIAGNOSIS — I472 Ventricular tachycardia: Secondary | ICD-10-CM | POA: Diagnosis not present

## 2018-09-10 NOTE — Patient Instructions (Signed)
Medication Instructions:  Drink more fluids next few days while on antibiotics Hold spironolactone while on antibiotics, restart after  If you need a refill on your cardiac medications before your next appointment, please call your pharmacy.    Lab work: No new labs needed   If you have labs (blood work) drawn today and your tests are completely normal, you will receive your results only by: Marland Kitchen MyChart Message (if you have MyChart) OR . A paper copy in the mail If you have any lab test that is abnormal or we need to change your treatment, we will call you to review the results.   Testing/Procedures: No new testing needed   Follow-Up: At [facility], you and your health needs are our priority.  As part of our continuing mission to provide you with exceptional heart care, we have created designated Provider Care Teams.  These Care Teams include your primary Cardiologist (physician) and Advanced Practice Providers (APPs -  Physician Assistants and Nurse Practitioners) who all work together to provide you with the care you need, when you need it.  . You will need a follow up appointment in 12 months .   Please call our office 2 months in advance to schedule this appointment.    . Providers on your designated Care Team:   . Murray Hodgkins, NP . Christell Faith, PA-C . Marrianne Mood, PA-C  Any Other Special Instructions Will Be Listed Below (If Applicable).  For educational health videos Log in to : [email or web address] Or : [email or web address], password : [email or web address]

## 2018-09-11 LAB — URINE CULTURE
MICRO NUMBER:: 46657
SPECIMEN QUALITY:: ADEQUATE

## 2018-09-19 ENCOUNTER — Other Ambulatory Visit (INDEPENDENT_AMBULATORY_CARE_PROVIDER_SITE_OTHER): Payer: Medicare Other

## 2018-09-19 DIAGNOSIS — D649 Anemia, unspecified: Secondary | ICD-10-CM

## 2018-09-19 LAB — CBC WITH DIFFERENTIAL/PLATELET
Basophils Absolute: 0.1 10*3/uL (ref 0.0–0.1)
Basophils Relative: 0.7 % (ref 0.0–3.0)
Eosinophils Absolute: 0.2 10*3/uL (ref 0.0–0.7)
Eosinophils Relative: 2.9 % (ref 0.0–5.0)
HCT: 38.7 % (ref 36.0–46.0)
Hemoglobin: 12.3 g/dL (ref 12.0–15.0)
LYMPHS ABS: 2.4 10*3/uL (ref 0.7–4.0)
Lymphocytes Relative: 35 % (ref 12.0–46.0)
MCHC: 31.9 g/dL (ref 30.0–36.0)
MCV: 83.9 fl (ref 78.0–100.0)
Monocytes Absolute: 0.5 10*3/uL (ref 0.1–1.0)
Monocytes Relative: 6.9 % (ref 3.0–12.0)
NEUTROS PCT: 54.5 % (ref 43.0–77.0)
Neutro Abs: 3.8 10*3/uL (ref 1.4–7.7)
Platelets: 255 10*3/uL (ref 150.0–400.0)
RBC: 4.61 Mil/uL (ref 3.87–5.11)
RDW: 15.9 % — ABNORMAL HIGH (ref 11.5–15.5)
WBC: 7 10*3/uL (ref 4.0–10.5)

## 2018-09-19 LAB — FERRITIN: Ferritin: 16 ng/mL (ref 10.0–291.0)

## 2018-09-20 ENCOUNTER — Other Ambulatory Visit: Payer: Self-pay | Admitting: Internal Medicine

## 2018-09-20 DIAGNOSIS — M65312 Trigger thumb, left thumb: Secondary | ICD-10-CM | POA: Diagnosis not present

## 2018-09-20 DIAGNOSIS — D509 Iron deficiency anemia, unspecified: Secondary | ICD-10-CM

## 2018-09-20 NOTE — Progress Notes (Signed)
Order placed for GI referral.   

## 2018-09-24 ENCOUNTER — Ambulatory Visit (INDEPENDENT_AMBULATORY_CARE_PROVIDER_SITE_OTHER): Payer: Medicare Other | Admitting: Endocrinology

## 2018-09-24 ENCOUNTER — Encounter: Payer: Self-pay | Admitting: Endocrinology

## 2018-09-24 VITALS — BP 102/68 | HR 70 | Ht 60.0 in | Wt 165.8 lb

## 2018-09-24 DIAGNOSIS — E0590 Thyrotoxicosis, unspecified without thyrotoxic crisis or storm: Secondary | ICD-10-CM

## 2018-09-24 LAB — TSH: TSH: 4.31 u[IU]/mL (ref 0.35–4.50)

## 2018-09-24 LAB — T3, FREE: T3, Free: 3.4 pg/mL (ref 2.3–4.2)

## 2018-09-24 LAB — T4, FREE: Free T4: 0.72 ng/dL (ref 0.60–1.60)

## 2018-09-24 MED ORDER — METHIMAZOLE 10 MG PO TABS: 10.0000 mg | ORAL_TABLET | Freq: Every day | ORAL | 3 refills | Status: DC

## 2018-09-24 MED ORDER — METHIMAZOLE 5 MG PO TABS: 5.0000 mg | ORAL_TABLET | Freq: Every day | ORAL | 3 refills | Status: DC

## 2018-09-24 NOTE — Patient Instructions (Signed)
blood tests are requested for you today.  We'll let you know about the results. If ever you have fever while taking methimazole, stop it and call us, even if the reason is obvious, because of the risk of a rare side-effect.  Please come back for a follow-up appointment in 3 months.   

## 2018-09-24 NOTE — Progress Notes (Signed)
Subjective:    [name], female    DOB: [birth year], 68 y.o.   MRN: [id number]  HPI Pt returns for f/u of hyperthyroidism (dx'ed 2015, when she presented with SVT; she was rx'ed with an uncertain medication for a few months in 2015; she has never had thyroid imaging; she declines RAI).  Since on the tapazole, pt states she feels not different, and well in general.   Past Medical History:  Diagnosis Date  . Allergy   . Cancer ([place])    basal cell  . Cardiomyopathy, nonischemic ([place])   . Carpal tunnel syndrome    bilateral  . Congestive heart failure ([place])    normalization of EF with medical therapy  . Dysrhythmia 08/31/2015   long RP tachycardia  . Female cystocele   . Hyperlipidemia   . Hypertension   . Migraines   . Overactive detrusor   . Personal history of colonic polyps   . PONV (postoperative nausea and vomiting)   . SUI (stress urinary incontinence, female)   . Uterovaginal prolapse   . Vaginal atrophy     Past Surgical History:  Procedure Laterality Date  . ANTERIOR AND POSTERIOR VAGINAL REPAIR W/ SACROSPINOUS LIGAMENT SUSPENSION  10/18/2012  . CARDIAC CATHETERIZATION N/A 11/26/2015   Procedure: Right/Left Heart Cath and Coronary Angiography;  Surgeon: Jolaine Artist, MD;  Location: [place] CV LAB;  Service: Cardiovascular;  Laterality: N/A;  . CHOLECYSTECTOMY  2006  . COLONOSCOPY WITH PROPOFOL N/A 03/19/2017   Procedure: COLONOSCOPY WITH PROPOFOL;  Surgeon: Lollie Sails, MD;  Location: [facility] ENDOSCOPY;  Service: Endoscopy;  Laterality: N/A;  . COLPOPEXY  10/18/2012  . CYSTOURETHROSCOPY  10/18/2012  . ESOPHAGOGASTRODUODENOSCOPY (EGD) WITH PROPOFOL N/A 10/03/2016   Procedure: ESOPHAGOGASTRODUODENOSCOPY (EGD) WITH PROPOFOL;  Surgeon: Lollie Sails, MD;  Location: [facility] ENDOSCOPY;  Service: Endoscopy;  Laterality: N/A;  . EYE SURGERY     cataract extration  . FINGER SURGERY    . HYSTEROSCOPY  2011, 10/2010  . OOPHORECTOMY    .  TONSILLECTOMY    . TUBAL LIGATION    . VAGINAL HYSTERECTOMY     midurethral sling    Social History   Socioeconomic History  . Marital status: Married    Spouse name: Not on file  . Number of children: 2  . Years of education: Not on file  . Highest education level: Not on file  Occupational History  . Not on file  Social Needs  . Financial resource strain: Not on file  . Food insecurity:    Worry: Not on file    Inability: Not on file  . Transportation needs:    Medical: Not on file    Non-medical: Not on file  Tobacco Use  . Smoking status: Never Smoker  . Smokeless tobacco: Never Used  Substance and Sexual Activity  . Alcohol use: Yes    Alcohol/week: 0.0 standard drinks    Comment: Socially  . Drug use: No  . Sexual activity: Not on file  Lifestyle  . Physical activity:    Days per week: Not on file    Minutes per session: Not on file  . Stress: Not on file  Relationships  . Social connections:    Talks on phone: Not on file    Gets together: Not on file    Attends religious service: Not on file    Active member of club or organization: Not on file    Attends meetings of clubs or  organizations: Not on file    Relationship status: Not on file  . Intimate partner violence:    Fear of current or ex partner: Not on file    Emotionally abused: Not on file    Physically abused: Not on file    Forced sexual activity: Not on file  Other Topics Concern  . Not on file  Social History Narrative  . Not on file    Current Outpatient Medications on File Prior to Visit  Medication Sig Dispense Refill  . calcium carbonate (OSCAL) 1500 (600 Ca) MG TABS tablet Take by mouth daily.     . carvedilol (COREG) 12.5 MG tablet Take 18.75 mg by mouth 2 (two) times daily with a meal.     . Cholecalciferol 1000 units capsule Take 1,000 Units by mouth daily.    . diphenhydramine-acetaminophen (TYLENOL PM) 25-500 MG TABS tablet Take 1 tablet by mouth at bedtime as needed.    .  ferrous sulfate 325 (65 FE) MG tablet Take 325 mg by mouth daily with breakfast.    . fexofenadine (ALLEGRA) 180 MG tablet Take 180 mg by mouth at bedtime.     . furosemide (LASIX) 20 MG tablet TAKE 1 TABLET (20 MG TOTAL) BY MOUTH EVERY OTHER DAY. 90 tablet 1  . lisinopril (PRINIVIL,ZESTRIL) 5 MG tablet TAKE 1 TABLET (5 MG TOTAL) BY MOUTH AT BEDTIME. REPORTED ON 11/11/2015 90 tablet 3  . pantoprazole (PROTONIX) 40 MG tablet Take 40 mg by mouth daily.    . simvastatin (ZOCOR) 10 MG tablet TAKE 1 TABLET (10 MG TOTAL) BY MOUTH DAILY AT 6 PM. 30 tablet 11  . spironolactone (ALDACTONE) 25 MG tablet TAKE 0.5 TABLETS (12.5 MG TOTAL) BY MOUTH DAILY. 45 tablet 3  . SUMAtriptan (IMITREX) 100 MG tablet TAKE 1 TABLET BY MOUTH EVERY DAY AS NEEDED FOR MIGRAINE. MAY REPEAT IN 2 HOURS IF HEADACHE 10 tablet 1  . zonisamide (ZONEGRAN) 100 MG capsule TAKE 1 CAPSULE BY MOUTH EVERY DAY 90 capsule 1   No current facility-administered medications on file prior to visit.     No Known Allergies  Family History  Problem Relation Age of Onset  . Hypertension Mother   . Stroke Mother   . Diabetes Mother   . Hypercholesterolemia Father   . Heart disease Father        MI  . Hypertension Father   . Stroke Father   . Breast cancer Neg Hx   . Colon cancer Neg Hx   . Thyroid disease Neg Hx     BP 102/68 (BP Location: Right Arm, Patient Position: Sitting, Cuff Size: Large)   Pulse 70   Ht 5' (1.524 m)   Wt 165 lb 12.8 oz (75.2 kg)   LMP 10/18/2012   SpO2 94%   BMI 32.38 kg/m    Review of Systems Denies fever    Objective:   Physical Exam VITAL SIGNS:  See vs page GENERAL: no distress NECK: There is no palpable thyroid enlargement.  No thyroid nodule is palpable.  No palpable lymphadenopathy at the anterior neck.     Lab Results  Component Value Date   TSH 4.31 09/24/2018   T4TOTAL 7.3 09/02/2015      Assessment & Plan:  Hyperthyroidism: well-controlled.  reduce the methimazole to 5 mg  daily Cardiomyopathy: she needs to maintain euthyroidism.  Patient Instructions  blood tests are requested for you today.  We'll let you know about the results. If ever you have fever while taking  methimazole, stop it and call us, even if the reason is obvious, because of the risk of a rare side-effect.  Please come back for a follow-up appointment in 3 months.

## 2018-09-30 DIAGNOSIS — D509 Iron deficiency anemia, unspecified: Secondary | ICD-10-CM | POA: Diagnosis not present

## 2018-10-01 ENCOUNTER — Other Ambulatory Visit: Payer: Self-pay | Admitting: Internal Medicine

## 2018-10-07 DIAGNOSIS — D509 Iron deficiency anemia, unspecified: Secondary | ICD-10-CM | POA: Diagnosis not present

## 2018-10-18 DIAGNOSIS — M75101 Unspecified rotator cuff tear or rupture of right shoulder, not specified as traumatic: Secondary | ICD-10-CM | POA: Diagnosis not present

## 2018-10-18 DIAGNOSIS — M75102 Unspecified rotator cuff tear or rupture of left shoulder, not specified as traumatic: Secondary | ICD-10-CM | POA: Diagnosis not present

## 2018-10-29 DIAGNOSIS — D509 Iron deficiency anemia, unspecified: Secondary | ICD-10-CM | POA: Diagnosis not present

## 2018-11-01 ENCOUNTER — Other Ambulatory Visit: Payer: Self-pay | Admitting: Internal Medicine

## 2018-11-01 DIAGNOSIS — M65342 Trigger finger, left ring finger: Secondary | ICD-10-CM | POA: Diagnosis not present

## 2018-11-16 ENCOUNTER — Other Ambulatory Visit (HOSPITAL_COMMUNITY): Payer: Self-pay | Admitting: Internal Medicine

## 2018-11-17 ENCOUNTER — Encounter: Payer: Self-pay | Admitting: Internal Medicine

## 2018-11-18 MED ORDER — OSELTAMIVIR PHOSPHATE 75 MG PO CAPS: 75.0000 mg | ORAL_CAPSULE | Freq: Two times a day (BID) | ORAL | 0 refills | Status: DC

## 2018-11-18 NOTE — Telephone Encounter (Signed)
Patient stated her fever broke last night and is feeling much better than yesterday. Says her fever broke last night. She was using tylenol for fever and robitussin dm as needed for cough. She takes an antihistamine as her daily regimen. Advised patient to let us know if symptoms persist or if they start to worsen. Also, pt said to let me know that Kristi Greer at GI is requesting to have her WBC checked at her next visit with [place] in April

## 2018-11-18 NOTE — Telephone Encounter (Signed)
Called pt and discussed her current symptoms.  States she had noticed some cough at the end of last week. She had been outside.  Related to allergies.  Developed fever (100.4 Tmax) Saturday.  Increased cough and congestion.  Slept.  Taking tylenol.  Also took robitussin DM last night.  Slept better last night and feels some better today.  Still with cough and congestion.  No sob.  Eating.  No vomiting.  Son traveled to [facility] and traveled back on 11/01/18.  Has not traveled since.  Son has no respiratory symptoms and no one in his family has symptoms.  Son-n-law is on tamiflu.  She has been exposed to him as well.  Discussed possible etiologies of her symptoms.  Discussed possible flu, given exposure to her son-n-law who is being treated for flu.  Also discussed possible Covid.  Son traveled to [facility] -  has been back for over two weeks and no symptoms.  Discussed tamiflu.  She is agreeable.  tamiflu rx sent in.  She is to monitor symptoms and call with update.  Discussed need for quarantine.    Please call pt 11/19/18 and see how she is doing.

## 2018-11-19 NOTE — Telephone Encounter (Signed)
Spoke with patient. She has taken 2 doses of tamiflu. She is tolerating okay. Stated that she has no fever today. Still having cough and runny nose but otherwise feeling okay. Advised patient to let us know how she is feeling or if she needs anything.

## 2018-11-28 ENCOUNTER — Other Ambulatory Visit: Payer: Self-pay | Admitting: Orthopedic Surgery

## 2018-11-28 DIAGNOSIS — M75101 Unspecified rotator cuff tear or rupture of right shoulder, not specified as traumatic: Secondary | ICD-10-CM

## 2018-12-08 ENCOUNTER — Other Ambulatory Visit: Payer: Self-pay | Admitting: Internal Medicine

## 2018-12-09 ENCOUNTER — Other Ambulatory Visit: Payer: Self-pay | Admitting: *Deleted

## 2018-12-09 MED ORDER — SPIRONOLACTONE 25 MG PO TABS: 12.5000 mg | ORAL_TABLET | Freq: Every day | ORAL | 3 refills | Status: DC

## 2018-12-09 NOTE — Telephone Encounter (Signed)
rx ok'd for imitrex #9 with no refills.

## 2018-12-09 NOTE — Telephone Encounter (Signed)
Last OV 09/09/2018 Next OV 12/18/2018 Last refill 04/24/2018

## 2018-12-11 ENCOUNTER — Encounter: Payer: Self-pay | Admitting: Internal Medicine

## 2018-12-12 NOTE — Telephone Encounter (Signed)
Pt has agreed for virtual visit

## 2018-12-13 ENCOUNTER — Telehealth: Payer: Self-pay | Admitting: *Deleted

## 2018-12-13 DIAGNOSIS — I420 Dilated cardiomyopathy: Secondary | ICD-10-CM

## 2018-12-13 DIAGNOSIS — R739 Hyperglycemia, unspecified: Secondary | ICD-10-CM

## 2018-12-13 DIAGNOSIS — E7800 Pure hypercholesterolemia, unspecified: Secondary | ICD-10-CM

## 2018-12-13 DIAGNOSIS — D649 Anemia, unspecified: Secondary | ICD-10-CM

## 2018-12-13 DIAGNOSIS — E0590 Thyrotoxicosis, unspecified without thyrotoxic crisis or storm: Secondary | ICD-10-CM

## 2018-12-13 NOTE — Telephone Encounter (Signed)
Please place future order for lab appt on Monday. 

## 2018-12-15 NOTE — Telephone Encounter (Signed)
Order placed for f/u labs.  

## 2018-12-16 ENCOUNTER — Other Ambulatory Visit (INDEPENDENT_AMBULATORY_CARE_PROVIDER_SITE_OTHER): Payer: Medicare Other

## 2018-12-16 ENCOUNTER — Encounter: Payer: Self-pay | Admitting: Internal Medicine

## 2018-12-16 ENCOUNTER — Other Ambulatory Visit: Payer: Self-pay

## 2018-12-16 DIAGNOSIS — D649 Anemia, unspecified: Secondary | ICD-10-CM

## 2018-12-16 DIAGNOSIS — I420 Dilated cardiomyopathy: Secondary | ICD-10-CM | POA: Diagnosis not present

## 2018-12-16 DIAGNOSIS — E7800 Pure hypercholesterolemia, unspecified: Secondary | ICD-10-CM

## 2018-12-16 DIAGNOSIS — R739 Hyperglycemia, unspecified: Secondary | ICD-10-CM | POA: Diagnosis not present

## 2018-12-16 DIAGNOSIS — E0590 Thyrotoxicosis, unspecified without thyrotoxic crisis or storm: Secondary | ICD-10-CM | POA: Diagnosis not present

## 2018-12-16 LAB — LIPID PANEL
Cholesterol: 173 mg/dL (ref 0–200)
HDL: 49.9 mg/dL (ref >39.00)
LDL Cholesterol: 105 mg/dL — ABNORMAL HIGH (ref 0–99)
NonHDL: 122.72
Total CHOL/HDL Ratio: 3
Triglycerides: 90 mg/dL (ref 0.0–149.0)
VLDL: 18 mg/dL (ref 0.0–40.0)

## 2018-12-16 LAB — CBC WITH DIFFERENTIAL/PLATELET
Basophils Absolute: 0 10*3/uL (ref 0.0–0.1)
Basophils Relative: 0.5 % (ref 0.0–3.0)
Eosinophils Absolute: 0.2 10*3/uL (ref 0.0–0.7)
Eosinophils Relative: 2.7 % (ref 0.0–5.0)
HCT: 40.7 % (ref 36.0–46.0)
Hemoglobin: 13.3 g/dL (ref 12.0–15.0)
Lymphocytes Relative: 31 % (ref 12.0–46.0)
Lymphs Abs: 2.8 10*3/uL (ref 0.7–4.0)
MCHC: 32.8 g/dL (ref 30.0–36.0)
MCV: 83.5 fl (ref 78.0–100.0)
Monocytes Absolute: 0.6 10*3/uL (ref 0.1–1.0)
Monocytes Relative: 6.6 % (ref 3.0–12.0)
Neutro Abs: 5.3 10*3/uL (ref 1.4–7.7)
Neutrophils Relative %: 59.2 % (ref 43.0–77.0)
Platelets: 228 10*3/uL (ref 150.0–400.0)
RBC: 4.87 Mil/uL (ref 3.87–5.11)
RDW: 16.9 % — ABNORMAL HIGH (ref 11.5–15.5)
WBC: 8.9 10*3/uL (ref 4.0–10.5)

## 2018-12-16 LAB — TSH: TSH: 3.5 u[IU]/mL (ref 0.35–4.50)

## 2018-12-16 LAB — BASIC METABOLIC PANEL
BUN: 22 mg/dL (ref 6–23)
CO2: 25 mEq/L (ref 19–32)
Calcium: 9.3 mg/dL (ref 8.4–10.5)
Chloride: 107 mEq/L (ref 96–112)
Creatinine, Ser: 0.93 mg/dL (ref 0.40–1.20)
GFR: 60.32 mL/min (ref >60.00)
Glucose, Bld: 100 mg/dL — ABNORMAL HIGH (ref 70–99)
Potassium: 4.2 mEq/L (ref 3.5–5.1)
Sodium: 139 mEq/L (ref 135–145)

## 2018-12-16 LAB — HEPATIC FUNCTION PANEL
ALT: 12 U/L (ref 0–35)
AST: 15 U/L (ref 0–37)
Albumin: 3.9 g/dL (ref 3.5–5.2)
Alkaline Phosphatase: 87 U/L (ref 39–117)
Bilirubin, Direct: 0.1 mg/dL (ref 0.0–0.3)
Total Bilirubin: 0.4 mg/dL (ref 0.2–1.2)
Total Protein: 6.6 g/dL (ref 6.0–8.3)

## 2018-12-16 LAB — FERRITIN: Ferritin: 18.8 ng/mL (ref 10.0–291.0)

## 2018-12-16 LAB — HEMOGLOBIN A1C: Hgb A1c MFr Bld: 6.4 % (ref 4.6–6.5)

## 2018-12-18 ENCOUNTER — Ambulatory Visit: Payer: Medicare Other | Admitting: Internal Medicine

## 2018-12-18 ENCOUNTER — Encounter: Payer: Self-pay | Admitting: Internal Medicine

## 2018-12-18 ENCOUNTER — Other Ambulatory Visit: Payer: Self-pay

## 2018-12-18 DIAGNOSIS — Z8669 Personal history of other diseases of the nervous system and sense organs: Secondary | ICD-10-CM

## 2018-12-18 DIAGNOSIS — E0590 Thyrotoxicosis, unspecified without thyrotoxic crisis or storm: Secondary | ICD-10-CM

## 2018-12-18 DIAGNOSIS — K219 Gastro-esophageal reflux disease without esophagitis: Secondary | ICD-10-CM

## 2018-12-18 DIAGNOSIS — I420 Dilated cardiomyopathy: Secondary | ICD-10-CM

## 2018-12-18 DIAGNOSIS — D72829 Elevated white blood cell count, unspecified: Secondary | ICD-10-CM

## 2018-12-18 DIAGNOSIS — E7800 Pure hypercholesterolemia, unspecified: Secondary | ICD-10-CM

## 2018-12-18 DIAGNOSIS — R739 Hyperglycemia, unspecified: Secondary | ICD-10-CM

## 2018-12-18 DIAGNOSIS — I5022 Chronic systolic (congestive) heart failure: Secondary | ICD-10-CM

## 2018-12-18 DIAGNOSIS — M25511 Pain in right shoulder: Secondary | ICD-10-CM

## 2018-12-18 NOTE — Progress Notes (Addendum)
[name], female   DOB: 11/11/1950, 68 y.o.   MRN: [id number] Virtual Visit via Video Note  This visit type was conducted due to national recommendations for restrictions regarding the COVID-19 pandemic (e.g. social distancing).  This format is felt to be most appropriate for this patient at this time.  All issues noted in this document were discussed and addressed.  No physical exam was performed (except for noted visual exam findings with Video Visits).   I connected with Kristi Greer on 12/18/18 at  8:30 AM EDT by a video enabled telemedicine application and verified that I am speaking with the correct person using two identifiers. Location patient: home Location provider: work Persons participating in the virtual visit: patient, provider  I discussed the limitations, risks, security and privacy concerns of performing an evaluation and management service by video and the availability of in person appointments. The patient expressed understanding and agreed to proceed.   Reason for visit: scheduled follow up appt.   HPI: She reports she is doing well.  Feels good.  Staying in.  Recently treated for flu.  Symptoms resolved.  Denies any fever, significant cough or sob.  States her breathing is doing well.  No chest pain.  No acid reflux.  No abdominal pain.  Bowels moving.  Seeing ortho for her shoulder.  Had a flare recently.  Better now.  Is planning for MRI in 01/2019.  Is s/p trigger finger release 11/04/18 and is doing well.  No problems with her finger now.  Saw GI recently for IDA.  Hemoccult cards negative.  Had colonoscopy and EGD 2018.  No further w/up felt warranted.  Recent hgb wnl.  Discussed labs.  a1c increased - 6.4.  She has not been exercising as much, secondary to not being able to go to the gym.  Has been eating more sweets lately.  Discussed diet and exercise.  Overall she feels good.     ROS: See pertinent positives and negatives per HPI.  Past Medical  History:  Diagnosis Date  . Allergy   . Cancer ([place])    basal cell  . Cardiomyopathy, nonischemic ([place])   . Carpal tunnel syndrome    bilateral  . Congestive heart failure ([place])    normalization of EF with medical therapy  . Dysrhythmia 08/31/2015   long RP tachycardia  . Female cystocele   . Hyperlipidemia   . Hypertension   . Migraines   . Overactive detrusor   . Personal history of colonic polyps   . PONV (postoperative nausea and vomiting)   . SUI (stress urinary incontinence, female)   . Uterovaginal prolapse   . Vaginal atrophy     Past Surgical History:  Procedure Laterality Date  . ANTERIOR AND POSTERIOR VAGINAL REPAIR W/ SACROSPINOUS LIGAMENT SUSPENSION  10/18/2012  . CARDIAC CATHETERIZATION N/A 11/26/2015   Procedure: Right/Left Heart Cath and Coronary Angiography;  Surgeon: Jolaine Artist, MD;  Location: [place] [facility];  Service: Cardiovascular;  Laterality: N/A;  . CHOLECYSTECTOMY  2006  . COLONOSCOPY WITH PROPOFOL N/A 03/19/2017   Procedure: COLONOSCOPY WITH PROPOFOL;  Surgeon: Lollie Sails, MD;  Location: [facility] ENDOSCOPY;  Service: Endoscopy;  Laterality: N/A;  . COLPOPEXY  10/18/2012  . CYSTOURETHROSCOPY  10/18/2012  . ESOPHAGOGASTRODUODENOSCOPY (EGD) WITH PROPOFOL N/A 10/03/2016   Procedure: ESOPHAGOGASTRODUODENOSCOPY (EGD) WITH PROPOFOL;  Surgeon: Lollie Sails, MD;  Location: [facility] ENDOSCOPY;  Service: Endoscopy;  Laterality: N/A;  . EYE SURGERY     cataract  extration  . FINGER SURGERY    . HYSTEROSCOPY  2011, 10/2010  . OOPHORECTOMY    . TONSILLECTOMY    . TUBAL LIGATION    . VAGINAL HYSTERECTOMY     midurethral sling    Family History  Problem Relation Age of Onset  . Hypertension Mother   . Stroke Mother   . Diabetes Mother   . Hypercholesterolemia Father   . Heart disease Father        MI  . Hypertension Father   . Stroke Father   . Breast cancer Neg Hx   . Colon cancer Neg Hx   . Thyroid disease Neg Hx     SOCIAL HX:  reviewed.    Current Outpatient Medications:  .  calcium carbonate (OSCAL) 1500 (600 Ca) MG TABS tablet, Take by mouth daily. , Disp: , Rfl:  .  carvedilol (COREG) 12.5 MG tablet, Take 1.5 tablets (18.75 mg total) by mouth 2 (two) times daily with a meal., Disp: 90 tablet, Rfl: 3 .  Cholecalciferol 1000 units capsule, Take 1,000 Units by mouth daily., Disp: , Rfl:  .  diphenhydramine-acetaminophen (TYLENOL PM) 25-500 MG TABS tablet, Take 1 tablet by mouth at bedtime as needed., Disp: , Rfl:  .  fexofenadine (ALLEGRA) 180 MG tablet, Take 180 mg by mouth at bedtime. , Disp: , Rfl:  .  furosemide (LASIX) 20 MG tablet, TAKE 1 TABLET (20 MG TOTAL) BY MOUTH EVERY OTHER DAY., Disp: 90 tablet, Rfl: 1 .  lisinopril (PRINIVIL,ZESTRIL) 5 MG tablet, TAKE 1 TABLET (5 MG TOTAL) BY MOUTH AT BEDTIME. REPORTED ON 11/11/2015, Disp: 90 tablet, Rfl: 3 .  methimazole (TAPAZOLE) 5 MG tablet, Take 1 tablet (5 mg total) by mouth daily., Disp: 90 tablet, Rfl: 3 .  pantoprazole (PROTONIX) 40 MG tablet, Take 40 mg by mouth daily., Disp: , Rfl:  .  simvastatin (ZOCOR) 10 MG tablet, TAKE 1 TABLET BY MOUTH DAILY AT 6 PM., Disp: 90 tablet, Rfl: 3 .  spironolactone (ALDACTONE) 25 MG tablet, Take 0.5 tablets (12.5 mg total) by mouth daily., Disp: 45 tablet, Rfl: 3 .  SUMAtriptan (IMITREX) 100 MG tablet, TAKE 1 TABLET BY MOUTH EVERY DAY AS NEEDED FOR MIGRAINE. MAY REPEAT IN 2 HOURS IF HEADACHE, Disp: 9 tablet, Rfl: 0 .  zonisamide (ZONEGRAN) 100 MG capsule, TAKE 1 CAPSULE BY MOUTH EVERY DAY, Disp: 90 capsule, Rfl: 1  EXAM:  GENERAL: alert, oriented, appears well and in no acute distress  HEENT: atraumatic, conjunttiva clear, no obvious abnormalities on inspection of external nose and ears  NECK: normal movements of the head and neck  LUNGS: on inspection no signs of respiratory distress, breathing rate appears normal, no obvious gross SOB, gasping or wheezing  CV: no obvious cyanosis  PSYCH/NEURO: pleasant and  cooperative, no obvious depression or anxiety, speech and thought processing grossly intact  ASSESSMENT AND PLAN:  Discussed the following assessment and plan:  Chronic systolic heart failure (HCC)  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HCC)  Gastroesophageal reflux disease, esophagitis presence not specified  History of migraine headaches  Hypercholesterolemia - Plan: Hepatic function panel, Lipid panel, Basic metabolic panel  Hyperthyroidism  Right shoulder pain, unspecified chronicity  Leukocytosis, unspecified type  Hyperglycemia - Plan: Hemoglobin H6Y  Chronic systolic heart failure ([place]) Followed by cardiology.  Doing well on current regimen.  Follow.    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[place]) Followed by cardiology.  Doing well on current regimen.    GERD (gastroesophageal reflux disease) Controlled.   History of migraine headaches  Worked up in the headache clinic.  Stable.    Hypercholesterolemia On simvastatin.  Low cholesterol diet and exercise.  Follow lipid panel and liver function tests.    Hyperthyroidism Followed by endocrinology.    Right shoulder pain Right shoulder pain as outlined.  Sees ortho.  Planning for MRI.    Leukocytosis Recent cbc revealed normal white blood cell count.    Hyperglycemia Low carb diet and exercise.  Follow met b and a1c.      I discussed the assessment and treatment plan with the patient. The patient was provided an opportunity to ask questions and all were answered. The patient agreed with the plan and demonstrated an understanding of the instructions.   The patient was advised to call back or seek an in-person evaluation if the symptoms worsen or if the condition fails to improve as anticipated.    Einar Pheasant, MD

## 2018-12-21 ENCOUNTER — Encounter: Payer: Self-pay | Admitting: Internal Medicine

## 2018-12-21 DIAGNOSIS — R739 Hyperglycemia, unspecified: Secondary | ICD-10-CM | POA: Insufficient documentation

## 2018-12-21 DIAGNOSIS — D72829 Elevated white blood cell count, unspecified: Secondary | ICD-10-CM | POA: Insufficient documentation

## 2018-12-21 DIAGNOSIS — M25511 Pain in right shoulder: Secondary | ICD-10-CM | POA: Insufficient documentation

## 2018-12-21 HISTORY — DX: Elevated white blood cell count, unspecified: D72.829

## 2018-12-21 NOTE — Assessment & Plan Note (Signed)
On simvastatin.  Low cholesterol diet and exercise.  Follow lipid panel and liver function tests.   

## 2018-12-21 NOTE — Assessment & Plan Note (Signed)
Followed by cardiology.  Doing well on current regimen.

## 2018-12-21 NOTE — Assessment & Plan Note (Signed)
Low carb diet and exercise.  Follow met b and a1c.   

## 2018-12-21 NOTE — Assessment & Plan Note (Signed)
Followed by endocrinology 

## 2018-12-21 NOTE — Assessment & Plan Note (Signed)
Recent cbc revealed normal white blood cell count.

## 2018-12-21 NOTE — Assessment & Plan Note (Signed)
Right shoulder pain as outlined.  Sees ortho.  Planning for MRI.

## 2018-12-21 NOTE — Assessment & Plan Note (Signed)
Controlled.  

## 2018-12-21 NOTE — Assessment & Plan Note (Signed)
Worked up in the headache clinic.  Stable.

## 2018-12-21 NOTE — Assessment & Plan Note (Signed)
Followed by cardiology.  Doing well on current regimen.  Follow.

## 2018-12-23 ENCOUNTER — Encounter: Payer: Self-pay | Admitting: Endocrinology

## 2018-12-24 ENCOUNTER — Other Ambulatory Visit: Payer: Self-pay

## 2018-12-24 ENCOUNTER — Ambulatory Visit (INDEPENDENT_AMBULATORY_CARE_PROVIDER_SITE_OTHER): Payer: Medicare Other | Admitting: Endocrinology

## 2018-12-24 DIAGNOSIS — E0590 Thyrotoxicosis, unspecified without thyrotoxic crisis or storm: Secondary | ICD-10-CM

## 2018-12-24 NOTE — Progress Notes (Signed)
Subjective:    [name], female    DOB: [birth year], 68 y.o.   MRN: [id number]  HPI  telehealth visit today via doxy video visit.  Alternatives to telehealth are presented to this patient, and the patient agrees to the telehealth visit. Pt is advised of the cost of the visit, and agrees to this, also.   Patient is at home, and I am at the office.   Persons attending the telehealth visit: the patient and I Pt returns for f/u of hyperthyroidism (dx'ed 2015, when she presented with SVT; she was rx'ed with an uncertain medication for a few months in 2015; she has never had thyroid imaging; she declines RAI).  Since on the tapazole, pt states she feels not different, and well in general.  Specifically, she denies palpitations, excessive diaphoresis, and tremor.   Past Medical History:  Diagnosis Date  . Allergy   . Cancer ([place])    basal cell  . Cardiomyopathy, nonischemic ([place])   . Carpal tunnel syndrome    bilateral  . Congestive heart failure ([place])    normalization of EF with medical therapy  . Dysrhythmia 08/31/2015   long RP tachycardia  . Female cystocele   . Hyperlipidemia   . Hypertension   . Migraines   . Overactive detrusor   . Personal history of colonic polyps   . PONV (postoperative nausea and vomiting)   . SUI (stress urinary incontinence, female)   . Uterovaginal prolapse   . Vaginal atrophy     Past Surgical History:  Procedure Laterality Date  . ANTERIOR AND POSTERIOR VAGINAL REPAIR W/ SACROSPINOUS LIGAMENT SUSPENSION  10/18/2012  . CARDIAC CATHETERIZATION N/A 11/26/2015   Procedure: Right/Left Heart Cath and Coronary Angiography;  Surgeon: Jolaine Artist, MD;  Location: [place] [facility];  Service: Cardiovascular;  Laterality: N/A;  . CHOLECYSTECTOMY  2006  . COLONOSCOPY WITH PROPOFOL N/A 03/19/2017   Procedure: COLONOSCOPY WITH PROPOFOL;  Surgeon: Lollie Sails, MD;  Location: [facility] ENDOSCOPY;  Service: Endoscopy;  Laterality: N/A;   . COLPOPEXY  10/18/2012  . CYSTOURETHROSCOPY  10/18/2012  . ESOPHAGOGASTRODUODENOSCOPY (EGD) WITH PROPOFOL N/A 10/03/2016   Procedure: ESOPHAGOGASTRODUODENOSCOPY (EGD) WITH PROPOFOL;  Surgeon: Lollie Sails, MD;  Location: [facility] ENDOSCOPY;  Service: Endoscopy;  Laterality: N/A;  . EYE SURGERY     cataract extration  . FINGER SURGERY    . HYSTEROSCOPY  2011, 10/2010  . OOPHORECTOMY    . TONSILLECTOMY    . TUBAL LIGATION    . VAGINAL HYSTERECTOMY     midurethral sling    Social History   Socioeconomic History  . Marital status: Married    Spouse name: Not on file  . Number of children: 2  . Years of education: Not on file  . Highest education level: Not on file  Occupational History  . Not on file  Social Needs  . Financial resource strain: Not on file  . Food insecurity:    Worry: Not on file    Inability: Not on file  . Transportation needs:    Medical: Not on file    Non-medical: Not on file  Tobacco Use  . Smoking status: Never Smoker  . Smokeless tobacco: Never Used  Substance and Sexual Activity  . Alcohol use: Yes    Alcohol/week: 0.0 standard drinks    Comment: Socially  . Drug use: No  . Sexual activity: Not on file  Lifestyle  . Physical activity:    Days per  week: Not on file    Minutes per session: Not on file  . Stress: Not on file  Relationships  . Social connections:    Talks on phone: Not on file    Gets together: Not on file    Attends religious service: Not on file    Active member of club or organization: Not on file    Attends meetings of clubs or organizations: Not on file    Relationship status: Not on file  . Intimate partner violence:    Fear of current or ex partner: Not on file    Emotionally abused: Not on file    Physically abused: Not on file    Forced sexual activity: Not on file  Other Topics Concern  . Not on file  Social History Narrative  . Not on file    Current Outpatient Medications on File Prior to Visit   Medication Sig Dispense Refill  . calcium carbonate (OSCAL) 1500 (600 Ca) MG TABS tablet Take by mouth daily.     . carvedilol (COREG) 12.5 MG tablet Take 1.5 tablets (18.75 mg total) by mouth 2 (two) times daily with a meal. 90 tablet 3  . Cholecalciferol 1000 units capsule Take 1,000 Units by mouth daily.    . diphenhydramine-acetaminophen (TYLENOL PM) 25-500 MG TABS tablet Take 1 tablet by mouth at bedtime as needed.    . fexofenadine (ALLEGRA) 180 MG tablet Take 180 mg by mouth at bedtime.     . furosemide (LASIX) 20 MG tablet TAKE 1 TABLET (20 MG TOTAL) BY MOUTH EVERY OTHER DAY. 90 tablet 1  . lisinopril (PRINIVIL,ZESTRIL) 5 MG tablet TAKE 1 TABLET (5 MG TOTAL) BY MOUTH AT BEDTIME. REPORTED ON 11/11/2015 90 tablet 3  . methimazole (TAPAZOLE) 5 MG tablet Take 1 tablet (5 mg total) by mouth daily. 90 tablet 3  . pantoprazole (PROTONIX) 40 MG tablet Take 40 mg by mouth daily.    . simvastatin (ZOCOR) 10 MG tablet TAKE 1 TABLET BY MOUTH DAILY AT 6 PM. 90 tablet 3  . spironolactone (ALDACTONE) 25 MG tablet Take 0.5 tablets (12.5 mg total) by mouth daily. 45 tablet 3  . SUMAtriptan (IMITREX) 100 MG tablet TAKE 1 TABLET BY MOUTH EVERY DAY AS NEEDED FOR MIGRAINE. MAY REPEAT IN 2 HOURS IF HEADACHE 9 tablet 0  . zonisamide (ZONEGRAN) 100 MG capsule TAKE 1 CAPSULE BY MOUTH EVERY DAY 90 capsule 1   No current facility-administered medications on file prior to visit.     No Known Allergies  Family History  Problem Relation Age of Onset  . Hypertension Mother   . Stroke Mother   . Diabetes Mother   . Hypercholesterolemia Father   . Heart disease Father        MI  . Hypertension Father   . Stroke Father   . Breast cancer Neg Hx   . Colon cancer Neg Hx   . Thyroid disease Neg Hx     LMP 10/18/2012   Review of Systems Denies fever.      Objective:   Physical Exam    Lab Results  Component Value Date   TSH 3.50 12/16/2018   T4TOTAL 7.3 09/02/2015      Assessment & Plan:   Hyperthyroidism: well-controlled SVT, by hx.  In this setting, she needs to maintain euthyroidism.    Patient Instructions  Please continue the same methimazole.  Please come back for a follow-up appointment in 4 months.

## 2018-12-24 NOTE — Patient Instructions (Signed)
Please continue the same methimazole.  Please come back for a follow-up appointment in 4 months.

## 2018-12-27 ENCOUNTER — Ambulatory Visit: Admission: RE | Admit: 2018-12-27 | Payer: Medicare Other | Source: Ambulatory Visit

## 2019-01-07 ENCOUNTER — Other Ambulatory Visit (HOSPITAL_COMMUNITY): Payer: Self-pay | Admitting: Internal Medicine

## 2019-01-10 ENCOUNTER — Ambulatory Visit
Admission: RE | Admit: 2019-01-10 | Discharge: 2019-01-10 | Disposition: A | Discharge status: Home / Self Care | Payer: Medicare Other | Source: Ambulatory Visit | Attending: Orthopedic Surgery | Admitting: Orthopedic Surgery

## 2019-01-10 ENCOUNTER — Other Ambulatory Visit: Payer: Self-pay

## 2019-01-10 DIAGNOSIS — M75101 Unspecified rotator cuff tear or rupture of right shoulder, not specified as traumatic: Secondary | ICD-10-CM | POA: Insufficient documentation

## 2019-01-10 DIAGNOSIS — M25511 Pain in right shoulder: Secondary | ICD-10-CM | POA: Diagnosis not present

## 2019-01-15 ENCOUNTER — Other Ambulatory Visit (HOSPITAL_COMMUNITY): Payer: Self-pay

## 2019-01-15 MED ORDER — LISINOPRIL 5 MG PO TABS: 5.0000 mg | ORAL_TABLET | Freq: Every day | ORAL | 3 refills | Status: DC

## 2019-01-24 DIAGNOSIS — M75121 Complete rotator cuff tear or rupture of right shoulder, not specified as traumatic: Secondary | ICD-10-CM | POA: Diagnosis not present

## 2019-01-24 DIAGNOSIS — M7581 Other shoulder lesions, right shoulder: Secondary | ICD-10-CM | POA: Diagnosis not present

## 2019-01-29 DIAGNOSIS — M7581 Other shoulder lesions, right shoulder: Secondary | ICD-10-CM | POA: Diagnosis not present

## 2019-01-29 DIAGNOSIS — M25511 Pain in right shoulder: Secondary | ICD-10-CM | POA: Diagnosis not present

## 2019-01-30 DIAGNOSIS — L578 Other skin changes due to chronic exposure to nonionizing radiation: Secondary | ICD-10-CM | POA: Diagnosis not present

## 2019-01-30 DIAGNOSIS — Z859 Personal history of malignant neoplasm, unspecified: Secondary | ICD-10-CM | POA: Diagnosis not present

## 2019-01-30 DIAGNOSIS — Z86018 Personal history of other benign neoplasm: Secondary | ICD-10-CM | POA: Diagnosis not present

## 2019-01-30 DIAGNOSIS — Z872 Personal history of diseases of the skin and subcutaneous tissue: Secondary | ICD-10-CM | POA: Diagnosis not present

## 2019-01-30 DIAGNOSIS — L570 Actinic keratosis: Secondary | ICD-10-CM | POA: Diagnosis not present

## 2019-01-30 DIAGNOSIS — L720 Epidermal cyst: Secondary | ICD-10-CM | POA: Diagnosis not present

## 2019-01-30 DIAGNOSIS — L821 Other seborrheic keratosis: Secondary | ICD-10-CM | POA: Diagnosis not present

## 2019-01-31 DIAGNOSIS — M25511 Pain in right shoulder: Secondary | ICD-10-CM | POA: Diagnosis not present

## 2019-01-31 DIAGNOSIS — M7581 Other shoulder lesions, right shoulder: Secondary | ICD-10-CM | POA: Diagnosis not present

## 2019-02-01 ENCOUNTER — Other Ambulatory Visit (HOSPITAL_COMMUNITY): Payer: Self-pay | Admitting: Internal Medicine

## 2019-02-03 DIAGNOSIS — M7581 Other shoulder lesions, right shoulder: Secondary | ICD-10-CM | POA: Diagnosis not present

## 2019-02-03 DIAGNOSIS — M25511 Pain in right shoulder: Secondary | ICD-10-CM | POA: Diagnosis not present

## 2019-02-05 DIAGNOSIS — M25511 Pain in right shoulder: Secondary | ICD-10-CM | POA: Diagnosis not present

## 2019-02-05 DIAGNOSIS — M7581 Other shoulder lesions, right shoulder: Secondary | ICD-10-CM | POA: Diagnosis not present

## 2019-02-10 ENCOUNTER — Other Ambulatory Visit (HOSPITAL_COMMUNITY): Payer: Self-pay | Admitting: Internal Medicine

## 2019-02-10 DIAGNOSIS — M7581 Other shoulder lesions, right shoulder: Secondary | ICD-10-CM | POA: Diagnosis not present

## 2019-02-10 DIAGNOSIS — M25511 Pain in right shoulder: Secondary | ICD-10-CM | POA: Diagnosis not present

## 2019-02-12 DIAGNOSIS — M7581 Other shoulder lesions, right shoulder: Secondary | ICD-10-CM | POA: Diagnosis not present

## 2019-02-12 DIAGNOSIS — M25511 Pain in right shoulder: Secondary | ICD-10-CM | POA: Diagnosis not present

## 2019-02-17 DIAGNOSIS — M7581 Other shoulder lesions, right shoulder: Secondary | ICD-10-CM | POA: Diagnosis not present

## 2019-02-17 DIAGNOSIS — M25511 Pain in right shoulder: Secondary | ICD-10-CM | POA: Diagnosis not present

## 2019-02-19 DIAGNOSIS — M25511 Pain in right shoulder: Secondary | ICD-10-CM | POA: Diagnosis not present

## 2019-02-19 DIAGNOSIS — M7581 Other shoulder lesions, right shoulder: Secondary | ICD-10-CM | POA: Diagnosis not present

## 2019-02-24 DIAGNOSIS — M25511 Pain in right shoulder: Secondary | ICD-10-CM | POA: Diagnosis not present

## 2019-02-24 DIAGNOSIS — M7581 Other shoulder lesions, right shoulder: Secondary | ICD-10-CM | POA: Diagnosis not present

## 2019-02-26 ENCOUNTER — Encounter: Payer: Self-pay | Admitting: Internal Medicine

## 2019-02-26 DIAGNOSIS — M25511 Pain in right shoulder: Secondary | ICD-10-CM | POA: Diagnosis not present

## 2019-02-26 DIAGNOSIS — M7581 Other shoulder lesions, right shoulder: Secondary | ICD-10-CM | POA: Diagnosis not present

## 2019-02-26 NOTE — Telephone Encounter (Signed)
Called to let her know that you are not taking new pts but I would send message over and ask. Also advised that her son go somewhere to be evaluated for passing out and fall. Pt agreed and said she would let him know.

## 2019-02-26 NOTE — Telephone Encounter (Signed)
I do not mind establishing care with him, but agree that he needs to go ahead and be evaluated for the syncopal episode.

## 2019-03-03 DIAGNOSIS — M25511 Pain in right shoulder: Secondary | ICD-10-CM | POA: Diagnosis not present

## 2019-03-03 DIAGNOSIS — M7581 Other shoulder lesions, right shoulder: Secondary | ICD-10-CM | POA: Diagnosis not present

## 2019-03-05 ENCOUNTER — Encounter: Payer: Self-pay | Admitting: Internal Medicine

## 2019-03-05 DIAGNOSIS — M25511 Pain in right shoulder: Secondary | ICD-10-CM | POA: Diagnosis not present

## 2019-03-05 DIAGNOSIS — M7581 Other shoulder lesions, right shoulder: Secondary | ICD-10-CM | POA: Diagnosis not present

## 2019-03-05 NOTE — Telephone Encounter (Signed)
Pt is going to send message with sons information so we can get him scheduled for new pt appt

## 2019-03-12 DIAGNOSIS — M7581 Other shoulder lesions, right shoulder: Secondary | ICD-10-CM | POA: Diagnosis not present

## 2019-03-12 DIAGNOSIS — M25511 Pain in right shoulder: Secondary | ICD-10-CM | POA: Diagnosis not present

## 2019-03-14 DIAGNOSIS — M7581 Other shoulder lesions, right shoulder: Secondary | ICD-10-CM | POA: Diagnosis not present

## 2019-03-14 DIAGNOSIS — M25511 Pain in right shoulder: Secondary | ICD-10-CM | POA: Diagnosis not present

## 2019-03-17 DIAGNOSIS — M25511 Pain in right shoulder: Secondary | ICD-10-CM | POA: Diagnosis not present

## 2019-03-17 DIAGNOSIS — M7581 Other shoulder lesions, right shoulder: Secondary | ICD-10-CM | POA: Diagnosis not present

## 2019-03-19 DIAGNOSIS — M7581 Other shoulder lesions, right shoulder: Secondary | ICD-10-CM | POA: Diagnosis not present

## 2019-03-19 DIAGNOSIS — M25511 Pain in right shoulder: Secondary | ICD-10-CM | POA: Diagnosis not present

## 2019-03-24 DIAGNOSIS — M7581 Other shoulder lesions, right shoulder: Secondary | ICD-10-CM | POA: Diagnosis not present

## 2019-03-24 DIAGNOSIS — M25511 Pain in right shoulder: Secondary | ICD-10-CM | POA: Diagnosis not present

## 2019-03-26 DIAGNOSIS — M25511 Pain in right shoulder: Secondary | ICD-10-CM | POA: Diagnosis not present

## 2019-03-26 DIAGNOSIS — M7581 Other shoulder lesions, right shoulder: Secondary | ICD-10-CM | POA: Diagnosis not present

## 2019-03-28 ENCOUNTER — Other Ambulatory Visit: Payer: Self-pay | Admitting: Internal Medicine

## 2019-03-28 NOTE — Telephone Encounter (Signed)
Refilled: 10/02/2018 Last OV: 12/18/2018 Next OV: 04/28/2019

## 2019-03-29 NOTE — Telephone Encounter (Signed)
rx ok'd for zonegran #90 with one refill.

## 2019-03-31 DIAGNOSIS — M7581 Other shoulder lesions, right shoulder: Secondary | ICD-10-CM | POA: Diagnosis not present

## 2019-03-31 DIAGNOSIS — M75121 Complete rotator cuff tear or rupture of right shoulder, not specified as traumatic: Secondary | ICD-10-CM | POA: Diagnosis not present

## 2019-04-02 ENCOUNTER — Ambulatory Visit (INDEPENDENT_AMBULATORY_CARE_PROVIDER_SITE_OTHER): Payer: Medicare Other

## 2019-04-02 ENCOUNTER — Other Ambulatory Visit: Payer: Self-pay

## 2019-04-02 DIAGNOSIS — Z0000 Encounter for general adult medical examination without abnormal findings: Secondary | ICD-10-CM | POA: Diagnosis not present

## 2019-04-02 NOTE — Progress Notes (Signed)
Subjective:   AMBRE KOBAYASHI is a 68 y.o. female who presents for an Initial Medicare Annual Wellness Visit.  Review of Systems    No ROS.  Medicare Wellness Virtual Visit.  Visual/audio telehealth visit, UTA vital signs.   See social history for additional risk factors.    Cardiac Risk Factors include: advanced age (>48men, >25 women)     Objective:    Today's Vitals   There is no height or weight on file to calculate BMI.  Advanced Directives 04/02/2019 12/31/2017 03/19/2017 10/03/2016 11/26/2015 11/15/2015 11/15/2015  Does Patient Have a Medical Advance Directive? Yes Yes No Yes Yes Yes Yes  Type of Academic librarian Living will;Healthcare Power of [place];Living will Out of facility DNR (pink MOST or yellow form) [place];Living will - -  Does patient want to make changes to medical advance directive? No - Patient declined No - Patient declined - - No - Patient declined No - Patient declined No - Patient declined  Copy of [place] in Chart? No - copy requested No - copy requested No - copy requested - No - copy requested No - copy requested No - copy requested  Would patient like information on creating a medical advance directive? - - - - No - patient declined information No - patient declined information -    Current Medications (verified) Outpatient Encounter Medications as of 04/02/2019  Medication Sig  . calcium carbonate (OSCAL) 1500 (600 Ca) MG TABS tablet Take by mouth daily.   . carvedilol (COREG) 12.5 MG tablet TAKE 1.5 TABLETS (18.75 MG TOTAL) BY MOUTH 2 (TWO) TIMES DAILY WITH A MEAL.  Marland Kitchen Cholecalciferol 1000 units capsule Take 1,000 Units by mouth daily.  . diphenhydramine-acetaminophen (TYLENOL PM) 25-500 MG TABS tablet Take 1 tablet by mouth at bedtime as needed.  . fexofenadine (ALLEGRA) 180 MG tablet Take 180 mg by mouth at bedtime.   . furosemide (LASIX) 20 MG tablet  TAKE 1 TABLET (20 MG TOTAL) BY MOUTH EVERY OTHER DAY.  Marland Kitchen lisinopril (ZESTRIL) 5 MG tablet Take 1 tablet (5 mg total) by mouth at bedtime. Reported on 11/11/2015  . methimazole (TAPAZOLE) 5 MG tablet Take 1 tablet (5 mg total) by mouth daily.  . pantoprazole (PROTONIX) 40 MG tablet Take 40 mg by mouth daily.  . simvastatin (ZOCOR) 10 MG tablet TAKE 1 TABLET BY MOUTH DAILY AT 6 PM.  . spironolactone (ALDACTONE) 25 MG tablet Take 0.5 tablets (12.5 mg total) by mouth daily.  . SUMAtriptan (IMITREX) 100 MG tablet TAKE 1 TABLET BY MOUTH EVERY DAY AS NEEDED FOR MIGRAINE. MAY REPEAT IN 2 HOURS IF HEADACHE  . zonisamide (ZONEGRAN) 100 MG capsule TAKE 1 CAPSULE BY MOUTH EVERY DAY   No facility-administered encounter medications on file as of 04/02/2019.     Allergies (verified) Patient has no known allergies.   History: Past Medical History:  Diagnosis Date  . Allergy   . Cancer ([place])    basal cell  . Cardiomyopathy, nonischemic ([place])   . Carpal tunnel syndrome    bilateral  . Congestive heart failure ([place])    normalization of EF with medical therapy  . Dysrhythmia 08/31/2015   long RP tachycardia  . Female cystocele   . Hyperlipidemia   . Hypertension   . Migraines   . Overactive detrusor   . Personal history of colonic polyps   . PONV (postoperative nausea and vomiting)   . SUI (  stress urinary incontinence, female)   . Uterovaginal prolapse   . Vaginal atrophy    Past Surgical History:  Procedure Laterality Date  . ANTERIOR AND POSTERIOR VAGINAL REPAIR W/ SACROSPINOUS LIGAMENT SUSPENSION  10/18/2012  . CARDIAC CATHETERIZATION N/A 11/26/2015   Procedure: Right/Left Heart Cath and Coronary Angiography;  Surgeon: Jolaine Artist, MD;  Location: [place] [facility];  Service: Cardiovascular;  Laterality: N/A;  . CHOLECYSTECTOMY  2006  . COLONOSCOPY WITH PROPOFOL N/A 03/19/2017   Procedure: COLONOSCOPY WITH PROPOFOL;  Surgeon: Lollie Sails, MD;  Location: [facility] ENDOSCOPY;  Service:  Endoscopy;  Laterality: N/A;  . COLPOPEXY  10/18/2012  . CYSTOURETHROSCOPY  10/18/2012  . ESOPHAGOGASTRODUODENOSCOPY (EGD) WITH PROPOFOL N/A 10/03/2016   Procedure: ESOPHAGOGASTRODUODENOSCOPY (EGD) WITH PROPOFOL;  Surgeon: Lollie Sails, MD;  Location: [facility] ENDOSCOPY;  Service: Endoscopy;  Laterality: N/A;  . EYE SURGERY     cataract extration  . FINGER SURGERY    . HYSTEROSCOPY  2011, 10/2010  . OOPHORECTOMY    . TONSILLECTOMY    . TUBAL LIGATION    . VAGINAL HYSTERECTOMY     midurethral sling   Family History  Problem Relation Age of Onset  . Hypertension Mother   . Stroke Mother   . Diabetes Mother   . Hypercholesterolemia Father   . Heart disease Father        MI  . Hypertension Father   . Stroke Father   . Breast cancer Neg Hx   . Colon cancer Neg Hx   . Thyroid disease Neg Hx    Social History   Socioeconomic History  . Marital status: Married    Spouse name: Not on file  . Number of children: 2  . Years of education: Not on file  . Highest education level: Not on file  Occupational History  . Not on file  Social Needs  . Financial resource strain: Not hard at all  . Food insecurity    Worry: Never true    Inability: Never true  . Transportation needs    Medical: No    Non-medical: No  Tobacco Use  . Smoking status: Never Smoker  . Smokeless tobacco: Never Used  Substance and Sexual Activity  . Alcohol use: Yes    Alcohol/week: 0.0 standard drinks    Comment: Socially  . Drug use: No  . Sexual activity: Not on file  Lifestyle  . Physical activity    Days per week: Not on file    Minutes per session: Not on file  . Stress: Not on file  Relationships  . Social Herbalist on phone: Not on file    Gets together: Not on file    Attends religious service: Not on file    Active member of club or organization: Not on file    Attends meetings of clubs or organizations: Not on file    Relationship status: Not on file  Other Topics Concern   . Not on file  Social History Narrative  . Not on file    Tobacco Counseling Counseling given: Not Answered   Clinical Intake:  Pre-visit preparation completed: Yes        Diabetes: No  How often do you need to have someone help you when you read instructions, pamphlets, or other written materials from your doctor or pharmacy?: 1 - Never  Interpreter Needed?: No      Activities of Daily Living In your present state of health, do you have  any difficulty performing the following activities: 04/02/2019  Hearing? N  Vision? N  Difficulty concentrating or making decisions? N  Walking or climbing stairs? N  Dressing or bathing? N  Doing errands, shopping? N  Preparing Food and eating ? N  Using the Toilet? N  In the past six months, have you accidently leaked urine? Y  Comment Managed with daily pad  Do you have problems with loss of bowel control? N  Managing your Medications? N  Managing your Finances? N  Housekeeping or managing your Housekeeping? N  Some recent data might be hidden     Immunizations and Health Maintenance Immunization History  Administered Date(s) Administered  . Influenza Split 05/27/2012, 05/26/2013, 05/15/2014  . Influenza, High Dose Seasonal PF 05/01/2018  . Influenza-Unspecified 05/26/2015   Health Maintenance Due  Topic Date Due  . TETANUS/TDAP  12/17/1971  . PNA vac Low Risk Adult (1 of 2 - PCV13) 12/16/2017  . INFLUENZA VACCINE  03/29/2019    Patient Care Team: Einar Pheasant, MD as PCP - General (Internal Medicine) Alisa Graff, FNP as Nurse Practitioner (Family Medicine) Isaias Cowman, MD as Consulting Physician (Cardiology) Abisogun, Domenica Reamer, MD as Consulting Physician (Endocrinology)  Indicate any recent Medical Services you may have received from other than [facility] providers in the past year (date may be approximate).     Assessment:   This is a routine wellness examination for Angelin.  I connected with  patient 04/02/19 at 11:30 AM EDT by a video/audio enabled telemedicine application and verified that I am speaking with the correct person using two identifiers. Patient stated full name and DOB. Patient gave permission to continue with virtual visit. Patient's location was at home and Nurse's location was at [place] office.   Health Screenings  Mammogram - 07/2018 Colonoscopy - 02/2017 Bone Density - 08/2018 Glaucoma -none Hearing -demonstrates normal hearing during visit. Hemoglobin A1C - 11/2018(6.4) Cholesterol - 11/2018 Dental- UTD  Social  Alcohol intake - yes      Smoking history- never    Smokers in home? none Illicit drug use? none Exercise - walking Diet - regular Sexually Active -not currently BMI- discussed the importance of a healthy diet, water intake and the benefits of aerobic exercise.  Educational material provided.   Safety  Patient feels safe at home- yes Patient does have smoke detectors at home- yes Patient does wear sunscreen or protective clothing when in direct sunlight -yes Patient does wear seat belt when in a moving vehicle -yes Patient drives- yes  [id number] precautions and sickness symptoms discussed.   Activities of Daily Living Patient denies needing assistance with: driving, household chores, feeding themselves, getting from bed to chair, getting to the toilet, bathing/showering, dressing, managing money, or preparing meals.  No new identified risk were noted.    Depression Screen Patient denies losing interest in daily life, feeling hopeless, or crying easily over simple problems.   Medication-taking as directed and without issues.   Memory Screen Patient is alert.  Patient denies difficulty focusing, concentrating or misplacing items. Correctly identified the president of the [place], season and recall.  Immunizations The following Immunizations were discussed: Influenza, shingles, pneumonia, and tetanus.   Other Providers Patient Care Team:  Einar Pheasant, MD as PCP - General (Internal Medicine) Alisa Graff, FNP as Nurse Practitioner (Family Medicine) Isaias Cowman, MD as Consulting Physician (Cardiology) Abisogun, Domenica Reamer, MD as Consulting Physician (Endocrinology)  Hearing/Vision screen  Hearing Screening   125Hz  250Hz  500Hz  1000Hz  2000Hz  3000Hz  4000Hz   6000Hz  8000Hz   Right ear:           Left ear:           Comments: Patient is able to hear conversational tones without difficulty.  No issues reported.    Vision Screening Comments: Cataract extraction, bilateral Visual acuity not assessed, virtual visit.    Dietary issues and exercise activities discussed: Current Exercise Habits: Home exercise routine, Intensity: Mild  Goals      Patient Stated   . DIET - REDUCE PORTION SIZE (pt-stated)    . Increase physical activity (pt-stated)     Walking for exercise Swimming, water aerobics      Depression Screen PHQ 2/9 Scores 04/02/2019 12/12/2017 07/28/2016 09/22/2015 06/25/2015 04/09/2015 09/17/2013  PHQ - 2 Score 0 0 0 0 0 0 0    Fall Risk Fall Risk  04/02/2019 07/28/2016 09/22/2015 06/25/2015 04/09/2015  Falls in the past year? 1 No No No No  Number falls in past yr: 0 - - - -  Injury with Fall? 0 - - - -  Follow up Falls prevention discussed - - - -   Is the patient's home free of loose throw rugs in walkways, pet beds, electrical cords, etc?  yes      Grab bars in the bathroom? yes      Handrails on the stairs?  yes      Adequate lighting?  yes  Cognitive Function:     6CIT Screen 04/02/2019  What Year? 0 points  What month? 0 points  What time? 0 points  Count back from 20 0 points  Months in reverse 0 points  Repeat phrase 0 points  Total Score 0    Screening Tests Health Maintenance  Topic Date Due  . TETANUS/TDAP  12/17/1971  . PNA vac Low Risk Adult (1 of 2 - PCV13) 12/16/2017  . INFLUENZA VACCINE  03/29/2019  . MAMMOGRAM  08/16/2019  . COLONOSCOPY  03/20/2027  . DEXA SCAN   Completed  . Hepatitis C Screening  Completed     Plan:    End of life planning; Advance aging; Advanced directives discussed.  Copy of current HCPOA/Living Will requested.    I have personally reviewed and noted the following in the patient's chart:   . Medical and social history . Use of alcohol, tobacco or illicit drugs  . Current medications and supplements . Functional ability and status . Nutritional status . Physical activity . Advanced directives . List of other physicians . Hospitalizations, surgeries, and ER visits in previous 12 months . Vitals . Screenings to include cognitive, depression, and falls . Referrals and appointments  In addition, I have reviewed and discussed with patient certain preventive protocols, quality metrics, and best practice recommendations. A written personalized care plan for preventive services as well as general preventive health recommendations were provided to patient.     Varney Biles, LPN   7/0/3500    Reviewed above information.  Agree with assessment and plan.    Dr Nicki Reaper

## 2019-04-02 NOTE — Patient Instructions (Addendum)
  Kristi Greer , Thank you for taking time to come for your Medicare Wellness Visit. I appreciate your ongoing commitment to your health goals. Please review the following plan we discussed and let me know if I can assist you in the future.   These are the goals we discussed: Goals      Patient Stated   . DIET - REDUCE PORTION SIZE (pt-stated)    . Increase physical activity (pt-stated)     Walking for exercise Swimming, water aerobics       This is a list of the screening recommended for you and due dates:  Health Maintenance  Topic Date Due  . Tetanus Vaccine  12/17/1971  . Pneumonia vaccines (1 of 2 - PCV13) 12/16/2017  . Flu Shot  03/29/2019  . Mammogram  08/16/2019  . Colon Cancer Screening  03/20/2027  . DEXA scan (bone density measurement)  Completed  .  Hepatitis C: One time screening is recommended by Center for Disease Control  (CDC) for  adults born from [birth year] through 1963.   Completed

## 2019-04-22 ENCOUNTER — Other Ambulatory Visit: Payer: Self-pay

## 2019-04-24 ENCOUNTER — Other Ambulatory Visit (INDEPENDENT_AMBULATORY_CARE_PROVIDER_SITE_OTHER): Payer: Medicare Other

## 2019-04-24 ENCOUNTER — Other Ambulatory Visit: Payer: Self-pay

## 2019-04-24 DIAGNOSIS — E7800 Pure hypercholesterolemia, unspecified: Secondary | ICD-10-CM

## 2019-04-24 DIAGNOSIS — R739 Hyperglycemia, unspecified: Secondary | ICD-10-CM

## 2019-04-24 LAB — HEMOGLOBIN A1C: Hgb A1c MFr Bld: 6.2 % (ref 4.6–6.5)

## 2019-04-24 LAB — HEPATIC FUNCTION PANEL
ALT: 10 U/L (ref 0–35)
AST: 17 U/L (ref 0–37)
Albumin: 4 g/dL (ref 3.5–5.2)
Alkaline Phosphatase: 91 U/L (ref 39–117)
Bilirubin, Direct: 0.1 mg/dL (ref 0.0–0.3)
Total Bilirubin: 0.4 mg/dL (ref 0.2–1.2)
Total Protein: 6.7 g/dL (ref 6.0–8.3)

## 2019-04-24 LAB — LIPID PANEL
Cholesterol: 161 mg/dL (ref 0–200)
HDL: 48.7 mg/dL (ref >39.00)
LDL Cholesterol: 90 mg/dL (ref 0–99)
NonHDL: 111.98
Total CHOL/HDL Ratio: 3
Triglycerides: 108 mg/dL (ref 0.0–149.0)
VLDL: 21.6 mg/dL (ref 0.0–40.0)

## 2019-04-24 LAB — BASIC METABOLIC PANEL
BUN: 24 mg/dL — ABNORMAL HIGH (ref 6–23)
CO2: 28 mEq/L (ref 19–32)
Calcium: 9.3 mg/dL (ref 8.4–10.5)
Chloride: 105 mEq/L (ref 96–112)
Creatinine, Ser: 1.06 mg/dL (ref 0.40–1.20)
GFR: 51.81 mL/min — ABNORMAL LOW (ref >60.00)
Glucose, Bld: 105 mg/dL — ABNORMAL HIGH (ref 70–99)
Potassium: 4.3 mEq/L (ref 3.5–5.1)
Sodium: 140 mEq/L (ref 135–145)

## 2019-04-25 ENCOUNTER — Encounter: Payer: Self-pay | Admitting: Internal Medicine

## 2019-04-25 ENCOUNTER — Other Ambulatory Visit: Payer: Self-pay

## 2019-04-25 ENCOUNTER — Encounter: Payer: Self-pay | Admitting: Endocrinology

## 2019-04-25 ENCOUNTER — Ambulatory Visit (INDEPENDENT_AMBULATORY_CARE_PROVIDER_SITE_OTHER): Payer: Medicare Other | Admitting: Endocrinology

## 2019-04-25 VITALS — BP 104/58 | HR 74 | Ht 60.0 in | Wt 161.4 lb

## 2019-04-25 DIAGNOSIS — E0590 Thyrotoxicosis, unspecified without thyrotoxic crisis or storm: Secondary | ICD-10-CM

## 2019-04-25 LAB — TSH: TSH: 1.48 u[IU]/mL (ref 0.35–4.50)

## 2019-04-25 LAB — T4, FREE: Free T4: 0.76 ng/dL (ref 0.60–1.60)

## 2019-04-25 NOTE — Progress Notes (Signed)
Subjective:    [name], female    DOB: 06/28/1951, 68 y.o.   MRN: [id number]  HPI Pt returns for f/u of hyperthyroidism (dx'ed 2015, when she presented with SVT; she was rx'ed with an uncertain medication for a few months in 2015; she has never had thyroid imaging; she declines RAI).  pt states she feels not different, and well in general.  Specifically, she denies palpitations, excessive diaphoresis, and tremor.  Past Medical History:  Diagnosis Date  . Allergy   . Cancer ([place])    basal cell  . Cardiomyopathy, nonischemic ([place])   . Carpal tunnel syndrome    bilateral  . Congestive heart failure ([place])    normalization of EF with medical therapy  . Dysrhythmia 08/31/2015   long RP tachycardia  . Female cystocele   . Hyperlipidemia   . Hypertension   . Migraines   . Overactive detrusor   . Personal history of colonic polyps   . PONV (postoperative nausea and vomiting)   . SUI (stress urinary incontinence, female)   . Uterovaginal prolapse   . Vaginal atrophy     Past Surgical History:  Procedure Laterality Date  . ANTERIOR AND POSTERIOR VAGINAL REPAIR W/ SACROSPINOUS LIGAMENT SUSPENSION  10/18/2012  . CARDIAC CATHETERIZATION N/A 11/26/2015   Procedure: Right/Left Heart Cath and Coronary Angiography;  Surgeon: Jolaine Artist, MD;  Location: [place] [facility] LAB;  Service: Cardiovascular;  Laterality: N/A;  . CHOLECYSTECTOMY  2006  . COLONOSCOPY WITH PROPOFOL N/A 03/19/2017   Procedure: COLONOSCOPY WITH PROPOFOL;  Surgeon: Lollie Sails, MD;  [facility] ENDOSCOPY;  Service: Endoscopy;  Laterality: N/A;  . COLPOPEXY  10/18/2012  . CYSTOURETHROSCOPY  10/18/2012  . ESOPHAGOGASTRODUODENOSCOPY (EGD) WITH PROPOFOL N/A 10/03/2016   Procedure: ESOPHAGOGASTRODUODENOSCOPY (EGD) WITH PROPOFOL;  Surgeon: Lollie Sails, MD;  Location: [facility] ENDOSCOPY;  Service: Endoscopy;  Laterality: N/A;  . EYE SURGERY     cataract extration  . FINGER SURGERY    .  HYSTEROSCOPY  2011, 10/2010  . OOPHORECTOMY    . TONSILLECTOMY    . TUBAL LIGATION    . VAGINAL HYSTERECTOMY     midurethral sling    Social History   Socioeconomic History  . Marital status: Married    Spouse name: Not on file  . Number of children: 2  . Years of education: Not on file  . Highest education level: Not on file  Occupational History  . Not on file  Social Needs  . Financial resource strain: Not hard at all  . Food insecurity    Worry: Never true    Inability: Never true  . Transportation needs    Medical: No    Non-medical: No  Tobacco Use  . Smoking status: Never Smoker  . Smokeless tobacco: Never Used  Substance and Sexual Activity  . Alcohol use: Yes    Alcohol/week: 0.0 standard drinks    Comment: Socially  . Drug use: No  . Sexual activity: Not on file  Lifestyle  . Physical activity    Days per week: Not on file    Minutes per session: Not on file  . Stress: Not on file  Relationships  . Social Herbalist on phone: Not on file    Gets together: Not on file    Attends religious service: Not on file    Active member of club or organization: Not on file    Attends meetings of clubs or organizations:  Not on file    Relationship status: Not on file  . Intimate partner violence    Fear of current or ex partner: No    Emotionally abused: No    Physically abused: No    Forced sexual activity: No  Other Topics Concern  . Not on file  Social History Narrative  . Not on file    Current Outpatient Medications on File Prior to Visit  Medication Sig Dispense Refill  . calcium carbonate (OSCAL) 1500 (600 Ca) MG TABS tablet Take by mouth daily.     . carvedilol (COREG) 12.5 MG tablet TAKE 1.5 TABLETS (18.75 MG TOTAL) BY MOUTH 2 (TWO) TIMES DAILY WITH A MEAL. 270 tablet 1  . Cholecalciferol 1000 units capsule Take 1,000 Units by mouth daily.    . diphenhydramine-acetaminophen (TYLENOL PM) 25-500 MG TABS tablet Take 1 tablet by mouth at  bedtime as needed.    . fexofenadine (ALLEGRA) 180 MG tablet Take 180 mg by mouth at bedtime.     . furosemide (LASIX) 20 MG tablet TAKE 1 TABLET (20 MG TOTAL) BY MOUTH EVERY OTHER DAY. 45 tablet 3  . lisinopril (ZESTRIL) 5 MG tablet Take 1 tablet (5 mg total) by mouth at bedtime. Reported on 11/11/2015 90 tablet 3  . methimazole (TAPAZOLE) 5 MG tablet Take 1 tablet (5 mg total) by mouth daily. 90 tablet 3  . pantoprazole (PROTONIX) 40 MG tablet Take 40 mg by mouth daily.    . simvastatin (ZOCOR) 10 MG tablet TAKE 1 TABLET BY MOUTH DAILY AT 6 PM. 90 tablet 3  . spironolactone (ALDACTONE) 25 MG tablet Take 0.5 tablets (12.5 mg total) by mouth daily. 45 tablet 3  . SUMAtriptan (IMITREX) 100 MG tablet TAKE 1 TABLET BY MOUTH EVERY DAY AS NEEDED FOR MIGRAINE. MAY REPEAT IN 2 HOURS IF HEADACHE 9 tablet 0  . zonisamide (ZONEGRAN) 100 MG capsule TAKE 1 CAPSULE BY MOUTH EVERY DAY 90 capsule 1   No current facility-administered medications on file prior to visit.     No Known Allergies  Family History  Problem Relation Age of Onset  . Hypertension Mother   . Stroke Mother   . Diabetes Mother   . Hypercholesterolemia Father   . Heart disease Father        MI  . Hypertension Father   . Stroke Father   . Breast cancer Neg Hx   . Colon cancer Neg Hx   . Thyroid disease Neg Hx     BP (!) 104/58 (BP Location: Left Arm, Patient Position: Sitting, Cuff Size: Large)   Pulse 74   Ht 5' (1.524 m)   Wt 161 lb 6.4 oz (73.2 kg)   LMP 10/18/2012   SpO2 96%   BMI 31.52 kg/m   Review of Systems Denies fever.      Objective:   Physical Exam VITAL SIGNS:  See vs page GENERAL: no distress NECK: There is no palpable thyroid enlargement.  No thyroid nodule is palpable.  No palpable lymphadenopathy at the anterior neck.     Lab Results  Component Value Date   TSH 1.48 04/25/2019   T4TOTAL 7.3 09/02/2015      Assessment & Plan:  Hyperthyroidism: well-controlled Cardiomyopathy: in this setting,  she should maintain euthyroidism.    Patient Instructions  If ever you have fever while taking methimazole, stop it and call us, even if the reason is obvious, because of the risk of a rare side-effect.   Blood tests are requested  for you today.  We'll let you know about the results.  Please come back for a follow-up appointment in 4-5 months.

## 2019-04-25 NOTE — Patient Instructions (Addendum)
If ever you have fever while taking methimazole, stop it and call us, even if the reason is obvious, because of the risk of a rare side-effect.   Blood tests are requested for you today.  We'll let you know about the results.  Please come back for a follow-up appointment in 4-5 months.

## 2019-04-28 ENCOUNTER — Other Ambulatory Visit: Payer: Self-pay

## 2019-04-28 ENCOUNTER — Ambulatory Visit (INDEPENDENT_AMBULATORY_CARE_PROVIDER_SITE_OTHER): Payer: Medicare Other | Admitting: Internal Medicine

## 2019-04-28 DIAGNOSIS — M79672 Pain in left foot: Secondary | ICD-10-CM | POA: Diagnosis not present

## 2019-04-28 DIAGNOSIS — I471 Supraventricular tachycardia, unspecified: Secondary | ICD-10-CM

## 2019-04-28 DIAGNOSIS — I5022 Chronic systolic (congestive) heart failure: Secondary | ICD-10-CM | POA: Diagnosis not present

## 2019-04-28 DIAGNOSIS — Z23 Encounter for immunization: Secondary | ICD-10-CM | POA: Diagnosis not present

## 2019-04-28 DIAGNOSIS — I420 Dilated cardiomyopathy: Secondary | ICD-10-CM | POA: Diagnosis not present

## 2019-04-28 DIAGNOSIS — R739 Hyperglycemia, unspecified: Secondary | ICD-10-CM

## 2019-04-28 DIAGNOSIS — Z7185 Encounter for immunization safety counseling: Secondary | ICD-10-CM

## 2019-04-28 DIAGNOSIS — Z7189 Other specified counseling: Secondary | ICD-10-CM | POA: Diagnosis not present

## 2019-04-28 DIAGNOSIS — E0590 Thyrotoxicosis, unspecified without thyrotoxic crisis or storm: Secondary | ICD-10-CM | POA: Diagnosis not present

## 2019-04-28 DIAGNOSIS — K219 Gastro-esophageal reflux disease without esophagitis: Secondary | ICD-10-CM

## 2019-04-28 DIAGNOSIS — E7800 Pure hypercholesterolemia, unspecified: Secondary | ICD-10-CM | POA: Diagnosis not present

## 2019-04-28 NOTE — Progress Notes (Signed)
[name], female   DOB: Jan 26, 1951, 68 y.o.   MRN: [id number]   Subjective:    [name], female    DOB: [birth year], 68 y.o.   MRN: [id number]  HPI   Patient here for a scheduled follow up.  Doing well.  Staying active.  Exercising in her pool.  Having increased shoulder pain.  Seeing ortho.  Torn rotator cuff. Planning for surgery in October.  Discussed f/u with cardiology prior to surgery - for pre op evaluation.  She denies any chest pain.  No sob.  No acid reflux.  No abdominal pain. Bowels moving.  Does report some discomfort in her left heel - posterior.  Plans to f/u with podiatry.  Discussed labs.     Past Medical History:  Diagnosis Date  . Allergy   . Cancer ([place])    basal cell  . Cardiomyopathy, nonischemic ([place])   . Carpal tunnel syndrome    bilateral  . Congestive heart failure ([place])    normalization of EF with medical therapy  . Dysrhythmia 08/31/2015   long RP tachycardia  . Female cystocele   . Hyperlipidemia   . Hypertension   . Migraines   . Overactive detrusor   . Personal history of colonic polyps   . PONV (postoperative nausea and vomiting)   . SUI (stress urinary incontinence, female)   . Uterovaginal prolapse   . Vaginal atrophy    Past Surgical History:  Procedure Laterality Date  . ANTERIOR AND POSTERIOR VAGINAL REPAIR W/ SACROSPINOUS LIGAMENT SUSPENSION  10/18/2012  . CARDIAC CATHETERIZATION N/A 11/26/2015   Procedure: Right/Left Heart Cath and Coronary Angiography;  Surgeon: Jolaine Artist, MD;  Location: [facility];  Service: Cardiovascular;  Laterality: N/A;  . CHOLECYSTECTOMY  2006  . COLONOSCOPY WITH PROPOFOL N/A 03/19/2017   Procedure: COLONOSCOPY WITH PROPOFOL;  Surgeon: Lollie Sails, MD;  Location: [facility]COPY;  Service: Endoscopy;  Laterality: N/A;  . COLPOPEXY  10/18/2012  . CYSTOURETHROSCOPY  10/18/2012  . ESOPHAGOGASTRODUODENOSCOPY (EGD) WITH PROPOFOL N/A 10/03/2016   Procedure: ESOPHAGOGASTRODUODENOSCOPY (EGD) WITH PROPOFOL;  Surgeon: Lollie Sails, MD;  Location: [facility] ENDOSCOPY;  Service: Endoscopy;  Laterality: N/A;  . EYE SURGERY     cataract extration  . FINGER SURGERY    . HYSTEROSCOPY  2011, 10/2010  . OOPHORECTOMY    . TONSILLECTOMY    . TUBAL LIGATION    . VAGINAL HYSTERECTOMY     midurethral sling   Family History  Problem Relation Age of Onset  . Hypertension Mother   . Stroke Mother   . Diabetes Mother   . Hypercholesterolemia Father   . Heart disease Father        MI  . Hypertension Father   . Stroke Father   . Breast cancer Neg Hx   . Colon cancer Neg Hx   . Thyroid disease Neg Hx    Social History   Socioeconomic History  . Marital status: Married    Spouse name: Not on file  . Number of children: 2  . Years of education: Not on file  . Highest education level: Not on file  Occupational History  . Not on file  Social Needs  . Financial resource strain: Not hard at all  . Food insecurity    Worry: Never true    Inability: Never true  . Transportation needs    Medical: No    Non-medical: No  Tobacco Use  . Smoking status:  Never Smoker  . Smokeless tobacco: Never Used  Substance and Sexual Activity  . Alcohol use: Yes    Alcohol/week: 0.0 standard drinks    Comment: Socially  . Drug use: No  . Sexual activity: Not on file  Lifestyle  . Physical activity    Days per week: Not on file    Minutes per session: Not on file  . Stress: Not on file  Relationships  . Social Herbalist on phone: Not on file    Gets together: Not on file    Attends religious service: Not on file    Active member of club or organization: Not on file    Attends meetings of clubs or organizations: Not on file    Relationship status: Not on file  Other Topics Concern  . Not on file  Social History Narrative  . Not on file    Outpatient Encounter Medications as of 04/28/2019  Medication Sig  . calcium carbonate  (OSCAL) 1500 (600 Ca) MG TABS tablet Take by mouth daily.   . carvedilol (COREG) 12.5 MG tablet TAKE 1.5 TABLETS (18.75 MG TOTAL) BY MOUTH 2 (TWO) TIMES DAILY WITH A MEAL.  Marland Kitchen Cholecalciferol 1000 units capsule Take 1,000 Units by mouth daily.  . diphenhydramine-acetaminophen (TYLENOL PM) 25-500 MG TABS tablet Take 1 tablet by mouth at bedtime as needed.  . fexofenadine (ALLEGRA) 180 MG tablet Take 180 mg by mouth at bedtime.   . furosemide (LASIX) 20 MG tablet TAKE 1 TABLET (20 MG TOTAL) BY MOUTH EVERY OTHER DAY.  Marland Kitchen lisinopril (ZESTRIL) 5 MG tablet Take 1 tablet (5 mg total) by mouth at bedtime. Reported on 11/11/2015  . methimazole (TAPAZOLE) 5 MG tablet Take 1 tablet (5 mg total) by mouth daily.  . pantoprazole (PROTONIX) 40 MG tablet Take 40 mg by mouth daily.  . simvastatin (ZOCOR) 10 MG tablet TAKE 1 TABLET BY MOUTH DAILY AT 6 PM.  . spironolactone (ALDACTONE) 25 MG tablet Take 0.5 tablets (12.5 mg total) by mouth daily.  . SUMAtriptan (IMITREX) 100 MG tablet TAKE 1 TABLET BY MOUTH EVERY DAY AS NEEDED FOR MIGRAINE. MAY REPEAT IN 2 HOURS IF HEADACHE  . zonisamide (ZONEGRAN) 100 MG capsule TAKE 1 CAPSULE BY MOUTH EVERY DAY   No facility-administered encounter medications on file as of 04/28/2019.     Review of Systems  Constitutional: Negative for appetite change and unexpected weight change.  HENT: Negative for congestion and sinus pressure.   Respiratory: Negative for cough, chest tightness and shortness of breath.   Cardiovascular: Negative for chest pain, palpitations and leg swelling.  Gastrointestinal: Negative for abdominal pain, diarrhea, nausea and vomiting.  Genitourinary: Negative for difficulty urinating and dysuria.  Musculoskeletal: Negative for joint swelling and myalgias.       Persistent shoulder pain.  Left heel pain as outlined.    Skin: Negative for color change and rash.  Neurological: Negative for dizziness, light-headedness and headaches.  Psychiatric/Behavioral:  Negative for agitation and dysphoric mood.       Objective:    Physical Exam Constitutional:      General: She is not in acute distress.    Appearance: Normal appearance.  HENT:     Right Ear: External ear normal. There is no impacted cerumen.     Left Ear: External ear normal. There is no impacted cerumen.  Eyes:     General: No scleral icterus.       Right eye: No discharge.  Left eye: No discharge.     Conjunctiva/sclera: Conjunctivae normal.  Neck:     Musculoskeletal: Neck supple. No muscular tenderness.     Thyroid: No thyromegaly.  Cardiovascular:     Rate and Rhythm: Normal rate and regular rhythm.  Pulmonary:     Effort: No respiratory distress.     Breath sounds: Normal breath sounds. No wheezing.  Abdominal:     General: Bowel sounds are normal.     Palpations: Abdomen is soft.     Tenderness: There is no abdominal tenderness.  Musculoskeletal:        General: No swelling or tenderness.  Lymphadenopathy:     Cervical: No cervical adenopathy.  Skin:    Findings: No erythema or rash.  Neurological:     Mental Status: She is alert.  Psychiatric:        Mood and Affect: Mood normal.        Behavior: Behavior normal.     BP 114/64   Pulse 76   Temp (!) 95.1 F (35.1 C) (Temporal)   Resp 16   Wt 160 lb (72.6 kg)   LMP 10/18/2012   SpO2 97%   BMI 31.25 kg/m  Wt Readings from Last 3 Encounters:  04/28/19 160 lb (72.6 kg)  04/25/19 161 lb 6.4 oz (73.2 kg)  09/24/18 165 lb 12.8 oz (75.2 kg)     Lab Results  Component Value Date   WBC 8.9 12/16/2018   HGB 13.3 12/16/2018   HCT 40.7 12/16/2018   PLT 228.0 12/16/2018   GLUCOSE 105 (H) 04/24/2019   CHOL 161 04/24/2019   TRIG 108.0 04/24/2019   HDL 48.70 04/24/2019   LDLCALC 90 04/24/2019   ALT 10 04/24/2019   AST 17 04/24/2019   NA 140 04/24/2019   K 4.3 04/24/2019   CL 105 04/24/2019   CREATININE 1.06 04/24/2019   BUN 24 (H) 04/24/2019   CO2 28 04/24/2019   TSH 1.48 04/25/2019   INR  0.98 11/26/2015   HGBA1C 6.2 04/24/2019    Mr Shoulder Right Wo Contrast  Result Date: 01/11/2019 CLINICAL DATA:  Chronic right shoulder pain and weakness. EXAM: MRI OF THE RIGHT SHOULDER WITHOUT CONTRAST TECHNIQUE: Multiplanar, multisequence MR imaging of the shoulder was performed. No intravenous contrast was administered. COMPARISON:  Right shoulder x-rays dated May 14, 2014. FINDINGS: Rotator cuff: Full-thickness, full width tear of the supraspinatus tendon with retraction to the glenoid. The tear also likely involves the superior infraspinatus tendon fibers. Moderate subscapularis tendinosis. The teres minor tendon is intact. Muscles: Mild supraspinatus and infraspinatus muscle atrophy. No muscle edema. Biceps long head:  Intact and normally positioned. Acromioclavicular Joint: Mild to moderate arthropathy of the acromioclavicular joint. Type II acromion. Small amount of fluid in the subacromial/subdeltoid bursa. Glenohumeral Joint: Small joint effusion with synovitis. Scattered mild partial-thickness cartilage loss. Labrum:  Degenerated superior labrum. Bones:  No marrow abnormality, fracture or dislocation. Other: None. IMPRESSION: 1. Full-thickness, full width tear of the supraspinatus tendon extending into the superior infraspinatus tendon. Mild supraspinatus and infraspinatus muscle atrophy. 2. Moderate subscapularis tendinosis. 3. 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 Electronically Signed   By: Titus Dubin M.D.   On: 01/11/2019 10:30       Assessment & Plan:   Problem List Items Addressed This Visit    Chronic systolic heart failure ([place]) (Chronic)    Followed by cardiology.  Doing well on current regimen.  Will f/u with cardiology prior to her shoulder surgery for  pre op evaluation.        Relevant Orders   Ambulatory referral to Cardiology   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[place]) (Chronic)    Stable.  Followed by cardiology and doing  well.        Relevant Orders   Ambulatory referral to Cardiology   GERD (gastroesophageal reflux disease)    Controlled.        Hypercholesterolemia    On simvastatin.  Low cholesterol diet and exercise.  Follow lipid panel and liver function tests.        Relevant Orders   Hepatic function panel   Lipid panel   Hyperglycemia    Low carb diet and exercise.  Follow met b and a1c.        Relevant Orders   Hemoglobin U8H   Basic metabolic panel   Hyperthyroidism    Followed by endocrinology.  Just evaluated.  Stable.        Immunization counseling    She will return for prevnar.        Pain of left heel    Plans to f/u with podiatry.       SVT (supraventricular tachycardia) ([facility]) (Chronic)    On carvedilol.  Stable.  Followed by cardiology.        Relevant Orders   Ambulatory referral to Cardiology    Other Visit Diagnoses    Need for immunization against influenza       Relevant Orders   Flu Vaccine QUAD High Dose(Fluad) (Completed)       Einar Pheasant, MD

## 2019-04-29 ENCOUNTER — Telehealth: Payer: Self-pay | Admitting: Cardiovascular Disease

## 2019-04-29 NOTE — Assessment & Plan Note (Signed)
Stable.  Followed by cardiology and doing well.

## 2019-04-29 NOTE — Assessment & Plan Note (Signed)
Controlled.  

## 2019-04-29 NOTE — Assessment & Plan Note (Signed)
On simvastatin.  Low cholesterol diet and exercise.  Follow lipid panel and liver function tests.   

## 2019-04-29 NOTE — Assessment & Plan Note (Signed)
Followed by endocrinology.  Just evaluated.  Stable.

## 2019-04-29 NOTE — Telephone Encounter (Signed)
° °  [facility] Pre-operative Risk Assessment    Request for surgical clearance:  1. What type of surgery is being performed? Reverse right total shoulder arthroplasty  2. When is this surgery scheduled? 06/12/19  3. What type of clearance is required (medical clearance vs. Pharmacy clearance to hold med vs. Both)? both  4. Are there any medications that need to be held prior to surgery and how long? Advise on blood thinners  5. Practice name and name of physician performing surgery? [facility] Dr. Roland Rack  6. What is your office phone number [phone]   7.   What is your office fax number [phone]  8.   Anesthesia type (None, local, MAC, general) ? Not noted   Marykay Lex 04/29/2019, 2:58 PM  _________________________________________________________________   (provider comments below)

## 2019-04-29 NOTE — Assessment & Plan Note (Signed)
Low carb diet and exercise.  Follow met b and a1c.   

## 2019-04-29 NOTE — Assessment & Plan Note (Signed)
Followed by cardiology.  Doing well on current regimen.  Will f/u with cardiology prior to her shoulder surgery for pre op evaluation.

## 2019-04-30 NOTE — Telephone Encounter (Signed)
   Primary Cardiologist: Ida Rogue, MD  Chart reviewed as part of pre-operative protocol coverage. Patient was contacted 04/30/2019 in reference to pre-operative risk assessment for pending surgery as outlined below.  Kristi Greer was last seen on 09/10/2018 by Dr. Rockey Situ.  Since that day, Kristi Greer has done well.  She has a history of nonischemic cardiomyopathy with normalization of EF by echo in 2018.  She has a history of nonsustained VT which has been well controlled on carvedilol.  Patient denies any heart failure type symptoms and has not had elevated heart rate on her Fitbit.  Therefore, based on ACC/AHA guidelines, the patient would be at acceptable risk for the planned procedure without further cardiovascular testing.   She should continue beta-blocker perioperatively as tolerated.  I will route this recommendation to the requesting party via Epic fax function and remove from pre-op pool.  Please call with questions.  Daune Perch, NP 04/30/2019, 2:09 PM

## 2019-05-03 ENCOUNTER — Encounter: Payer: Self-pay | Admitting: Internal Medicine

## 2019-05-03 DIAGNOSIS — Z7185 Encounter for immunization safety counseling: Secondary | ICD-10-CM | POA: Insufficient documentation

## 2019-05-03 DIAGNOSIS — M79672 Pain in left foot: Secondary | ICD-10-CM | POA: Insufficient documentation

## 2019-05-03 DIAGNOSIS — Z7189 Other specified counseling: Secondary | ICD-10-CM | POA: Insufficient documentation

## 2019-05-03 NOTE — Assessment & Plan Note (Signed)
She will return for prevnar.

## 2019-05-03 NOTE — Assessment & Plan Note (Signed)
On carvedilol.  Stable.  Followed by cardiology.  

## 2019-05-03 NOTE — Assessment & Plan Note (Signed)
Plans to f/u with podiatry.

## 2019-05-09 ENCOUNTER — Ambulatory Visit (INDEPENDENT_AMBULATORY_CARE_PROVIDER_SITE_OTHER): Payer: Medicare Other | Admitting: Podiatry

## 2019-05-09 ENCOUNTER — Other Ambulatory Visit: Payer: Self-pay

## 2019-05-09 ENCOUNTER — Ambulatory Visit (INDEPENDENT_AMBULATORY_CARE_PROVIDER_SITE_OTHER): Payer: Medicare Other

## 2019-05-09 ENCOUNTER — Other Ambulatory Visit: Payer: Self-pay | Admitting: Internal Medicine

## 2019-05-09 ENCOUNTER — Encounter: Payer: Self-pay | Admitting: Podiatry

## 2019-05-09 ENCOUNTER — Other Ambulatory Visit (HOSPITAL_COMMUNITY): Payer: Self-pay | Admitting: Internal Medicine

## 2019-05-09 DIAGNOSIS — M7661 Achilles tendinitis, right leg: Secondary | ICD-10-CM

## 2019-05-09 MED ORDER — MELOXICAM 15 MG PO TABS: 15.0000 mg | ORAL_TABLET | Freq: Every day | ORAL | 1 refills | Status: DC

## 2019-05-11 NOTE — Progress Notes (Signed)
   HPI: 68 year old female presenting today for follow up evaluation of insertional Achilles tendinitis of the right lower extremity. She reports pain to the posterior right heel. She reports an associated painful nodule on the foot as well as redness. She states it feels like something is sticking her foot. She has not done anything for treatment recently. Walking increases her symptoms. Patient is here for further evaluation and treatment.    Past Medical History:  Diagnosis Date  . Allergy   . Cancer ([place])    basal cell  . Cardiomyopathy, nonischemic ([place])   . Carpal tunnel syndrome    bilateral  . Congestive heart failure ([place])    normalization of EF with medical therapy  . Dysrhythmia 08/31/2015   long RP tachycardia  . Female cystocele   . Hyperlipidemia   . Hypertension   . Migraines   . Overactive detrusor   . Personal history of colonic polyps   . PONV (postoperative nausea and vomiting)   . SUI (stress urinary incontinence, female)   . Uterovaginal prolapse   . Vaginal atrophy       Physical Exam: General: The patient is alert and oriented x3 in no acute distress.  Dermatology: Skin is warm, dry and supple bilateral lower extremities. Negative for open lesions or macerations.  Vascular: Palpable pedal pulses bilaterally. No edema or erythema noted. Capillary refill within normal limits.  Neurological: Epicritic and protective threshold grossly intact bilaterally.   Musculoskeletal Exam: Pain on palpation noted to the posterior tubercle of the right calcaneus at the insertion of the Achilles tendon consistent with retrocalcaneal bursitis. Range of motion within normal limits. Muscle strength 5/5 in all muscle groups bilateral lower extremities.  Radiographic Exam:  Posterior calcaneal spur noted to the respective calcaneus on lateral view. No fracture or dislocation noted. Normal osseous mineralization noted.     Assessment: 1. Insertional Achilles tendinitis  right 2. Retrocalcaneal bursitis   Plan of Care:  1. Patient was evaluated. Radiographs were reviewed today. 2. Injection of 0.5 mL Celestone Soluspan injected into the retrocalcaneal bursa. Care was taken to avoid direct injection into the Achilles tendon. 3. Prescription for Meloxicam provided to patient.  4. Recommended open heel shoes.  5. Patient having right shoulder surgery on 06/12/2019.  6. Return to clinic when shoulder is healed for possible surgical consult.    Edrick Kins, DPM [facility]  Dr. Edrick Kins, [address]                                        [place], [place] [address]                Office [phone]  Fax [phone]

## 2019-05-13 ENCOUNTER — Other Ambulatory Visit: Payer: Self-pay

## 2019-05-13 ENCOUNTER — Ambulatory Visit (INDEPENDENT_AMBULATORY_CARE_PROVIDER_SITE_OTHER): Payer: Medicare Other

## 2019-05-13 DIAGNOSIS — Z23 Encounter for immunization: Secondary | ICD-10-CM | POA: Diagnosis not present

## 2019-05-14 DIAGNOSIS — M7542 Impingement syndrome of left shoulder: Secondary | ICD-10-CM | POA: Diagnosis not present

## 2019-05-14 DIAGNOSIS — M25512 Pain in left shoulder: Secondary | ICD-10-CM | POA: Diagnosis not present

## 2019-05-14 DIAGNOSIS — M7582 Other shoulder lesions, left shoulder: Secondary | ICD-10-CM | POA: Diagnosis not present

## 2019-05-14 DIAGNOSIS — G8929 Other chronic pain: Secondary | ICD-10-CM | POA: Diagnosis not present

## 2019-05-26 ENCOUNTER — Ambulatory Visit: Payer: Medicare Other | Admitting: Physician Assistant

## 2019-05-26 NOTE — Telephone Encounter (Signed)
Patient was scheduled for a Pre-op Clearance Appointment with Kristi Greer today, 05/26/19. She received a message stating that her appointment was cancelled and needed to reschedule. The only open appointments between now and her surgery are virtual. Will the patient be able to be cleared for her surgery in a virtual visit? Please advise

## 2019-05-26 NOTE — Telephone Encounter (Signed)
Patient has been rescheduled for an in office visit with Dr. Rockey Situ on 05/27/19.

## 2019-05-27 ENCOUNTER — Other Ambulatory Visit: Payer: Self-pay

## 2019-05-27 ENCOUNTER — Ambulatory Visit (INDEPENDENT_AMBULATORY_CARE_PROVIDER_SITE_OTHER): Payer: Medicare Other | Admitting: Cardiovascular Disease

## 2019-05-27 ENCOUNTER — Encounter: Payer: Self-pay | Admitting: Cardiovascular Disease

## 2019-05-27 VITALS — BP 94/60 | HR 73 | Ht 61.0 in | Wt 163.0 lb

## 2019-05-27 DIAGNOSIS — I4729 Other ventricular tachycardia: Secondary | ICD-10-CM

## 2019-05-27 DIAGNOSIS — I472 Ventricular tachycardia: Secondary | ICD-10-CM | POA: Diagnosis not present

## 2019-05-27 DIAGNOSIS — I471 Supraventricular tachycardia: Secondary | ICD-10-CM

## 2019-05-27 DIAGNOSIS — I5022 Chronic systolic (congestive) heart failure: Secondary | ICD-10-CM | POA: Diagnosis not present

## 2019-05-27 DIAGNOSIS — I428 Other cardiomyopathies: Secondary | ICD-10-CM | POA: Diagnosis not present

## 2019-05-27 MED ORDER — CARVEDILOL 12.5 MG PO TABS: 12.5000 mg | ORAL_TABLET | Freq: Two times a day (BID) | ORAL | 2 refills | Status: DC

## 2019-05-27 NOTE — Progress Notes (Signed)
Cardiology Office Note  Date:  05/27/2019   ID:  Abigael, Brohl [birth year], MRN [id number]  PCP:  Einar Pheasant, MD   Chief Complaint  Patient presents with  . Other    patient needs Pre-op clearance. Surgery is scheduled for 06/17/2019. Meds reviewed verbally with patient.     HPI:  Ms. Haddad is a 68 y/o woman with h/o obesity,  migraines  HTN  Nonischemic cardiomyopathy 08/2015 EF was 35 to 40% felt to be tachycardia mediated, Nonsustained VT Recovery and cardiac function, ejection fraction 55 to 60% April 2018 and 2019 Nonobstructive coronary disease on catheterization Who presents for follow-up of her cardiomyopathy  Needs reverse shoulder surgery on the right Dr. Roland Rack, oct 20th  Tried PT  BP low today Denies any orthostasis symptoms Blood pressure was low on her prior clinic visit (presumed at that time to be exacerbated by UTI)  Recent studies reviewed with her Echo 2019 Normal EF 50 to [id number] moderate diastolic dysfunction.  Denies any tachycardia or palpitations asymptomatic in the past even when she had short runs of NSVT or SVT  EKG personally reviewed by myself on todays visit Shows normal sinus rhythm rate 66 bpm no significant ST or T wave changes  Other past medical history reviewed with her on today's visit January 2017 admitted at [facility] with acute CHF and SVT (No palpitations at the time) Received adenosine but didn't break.  converted spontaneously.  Other past medical history reviewed Prior history of hyperthyroidism.  Treated with tapazole and a b-blocker.   cath 11/26/15 with NICM   Echo 4/18 EF 55-60% 30-d monitor 5/19:   - Normal sinus rhythm    - One 13-beat run NSVT on 5/12   - No other high-grade arrhythmias or blocks.    30-d monitor brief nonsustained VT for which she was asymptomatic. She has seen Dr. Rayann Heman. They discussed possible EPS but with normal EF have opted for medical therapy with carvedilol unless EF drops or  has recurrent arrhythmias or worsening symptoms   episode on her FitBit on Saturday with HR up to 190 briefly - no symptoms with this.   Echo today EF 55% no RWMA Personally reviewed   cMRI 03/2016 LVEF 48% RV normal. No evidence of infiltrative disease.   On 10/11/15 had Myoview EF 30% No ischemia or scar.  Event monitor 4/17: NSR one 7 beat NSVT. No SVT  Echo 01/31/16 LVEF 40-45%, Grade 1 DD, Normal RV  1. Normal coronary arteries 2. NICM EF 30-35% suspect tachycardia-induced  Previous 30-day monitor in May 2019 One 13-beat run NSVT on 5/12   PMH:   has a past medical history of Allergy, Cancer ([place]), Cardiomyopathy, nonischemic ([place]), Carpal tunnel syndrome, Congestive heart failure ([place]), Dysrhythmia (08/31/2015), Female cystocele, Hyperlipidemia, Hypertension, Migraines, Overactive detrusor, Personal history of colonic polyps, PONV (postoperative nausea and vomiting), SUI (stress urinary incontinence, female), Uterovaginal prolapse, and Vaginal atrophy.  PSH:    Past Surgical History:  Procedure Laterality Date  . ANTERIOR AND POSTERIOR VAGINAL REPAIR W/ SACROSPINOUS LIGAMENT SUSPENSION  10/18/2012  . CARDIAC CATHETERIZATION N/A 11/26/2015   Procedure: Right/Left Heart Cath and Coronary Angiography;  Surgeon: Jolaine Artist, MD;  Location: [place] CV LAB;  Service: Cardiovascular;  Laterality: N/A;  . CHOLECYSTECTOMY  2006  . COLONOSCOPY WITH PROPOFOL N/A 03/19/2017   Procedure: COLONOSCOPY WITH PROPOFOL;  Surgeon: Lollie Sails, MD;  Location: [facility] ENDOSCOPY;  Service: Endoscopy;  Laterality: N/A;  . COLPOPEXY  10/18/2012  . CYSTOURETHROSCOPY  10/18/2012  . ESOPHAGOGASTRODUODENOSCOPY (EGD) WITH PROPOFOL N/A 10/03/2016   Procedure: ESOPHAGOGASTRODUODENOSCOPY (EGD) WITH PROPOFOL;  Surgeon: Lollie Sails, MD;  Location: [facility] ENDOSCOPY;  Service: Endoscopy;  Laterality: N/A;  . EYE SURGERY     cataract extration  . FINGER SURGERY    . HYSTEROSCOPY  2011, 10/2010   . OOPHORECTOMY    . TONSILLECTOMY    . TUBAL LIGATION    . VAGINAL HYSTERECTOMY     midurethral sling    Current Outpatient Medications  Medication Sig Dispense Refill  . calcium carbonate (OSCAL) 1500 (600 Ca) MG TABS tablet Take by mouth daily.     . carvedilol (COREG) 12.5 MG tablet TAKE 1.5 TABLETS (18.75 MG TOTAL) BY MOUTH 2 (TWO) TIMES DAILY WITH A MEAL. 270 tablet 0  . Cholecalciferol 1000 units capsule Take 1,000 Units by mouth daily.    . diphenhydramine-acetaminophen (TYLENOL PM) 25-500 MG TABS tablet Take 1 tablet by mouth at bedtime as needed.    . fexofenadine (ALLEGRA) 180 MG tablet Take 180 mg by mouth at bedtime.     . furosemide (LASIX) 20 MG tablet TAKE 1 TABLET (20 MG TOTAL) BY MOUTH EVERY OTHER DAY. 45 tablet 3  . lisinopril (ZESTRIL) 5 MG tablet Take 1 tablet (5 mg total) by mouth at bedtime. Reported on 11/11/2015 90 tablet 3  . meloxicam (MOBIC) 15 MG tablet Take 1 tablet (15 mg total) by mouth daily. 30 tablet 1  . methimazole (TAPAZOLE) 5 MG tablet Take 1 tablet (5 mg total) by mouth daily. 90 tablet 3  . pantoprazole (PROTONIX) 40 MG tablet Take 40 mg by mouth daily.    . simvastatin (ZOCOR) 10 MG tablet TAKE 1 TABLET BY MOUTH DAILY AT 6 PM. 90 tablet 3  . spironolactone (ALDACTONE) 25 MG tablet Take 0.5 tablets (12.5 mg total) by mouth daily. 45 tablet 3  . SUMAtriptan (IMITREX) 100 MG tablet TAKE 1 TABLET BY MOUTH EVERY DAY AS NEEDED FOR MIGRAINE. MAY REPEAT IN 2 HOURS IF HEADACHE 9 tablet 0  . zonisamide (ZONEGRAN) 100 MG capsule TAKE 1 CAPSULE BY MOUTH EVERY DAY 90 capsule 1   No current facility-administered medications for this visit.      Allergies:   Patient has no known allergies.   Social History:  The patient  reports that she has never smoked. She has never used smokeless tobacco. She reports current alcohol use. She reports that she does not use drugs.   Family History:   family history includes Diabetes in her mother; Heart disease in her  father; Hypercholesterolemia in her father; Hypertension in her father and mother; Stroke in her father and mother.    Review of Systems: Review of Systems  Constitutional: Negative.   HENT: Negative.   Respiratory: Negative.   Cardiovascular: Negative.   Gastrointestinal: Negative.   Musculoskeletal: Negative.   Neurological: Negative.   Psychiatric/Behavioral: Negative.   All other systems reviewed and are negative.    PHYSICAL EXAM: VS:  BP 94/60 (BP Location: Left Arm, Patient Position: Sitting, Cuff Size: Normal)   Pulse 73   Ht 5\' 1"  (1.549 m)   Wt 163 lb (73.9 kg)   LMP 10/18/2012   BMI 30.80 kg/m  , BMI Body mass index is 30.8 kg/m. GEN: Well nourished, well developed, in no acute distress  HEENT: normal  Neck: no JVD, carotid bruits, or masses Cardiac: RRR; no murmurs, rubs, or gallops,no edema  Respiratory:  clear to auscultation bilaterally, normal work of breathing GI:  soft, nontender, nondistended, + BS MS: no deformity or atrophy  Skin: warm and dry, no rash Neuro:  Strength and sensation are intact Psych: euthymic mood, full affect  Recent Labs: 12/16/2018: Hemoglobin 13.3; Platelets 228.0 04/24/2019: ALT 10; BUN 24; Creatinine, Ser 1.06; Potassium 4.3; Sodium 140 04/25/2019: TSH 1.48    Lipid Panel Lab Results  Component Value Date   CHOL 161 04/24/2019   HDL 48.70 04/24/2019   LDLCALC 90 04/24/2019   TRIG 108.0 04/24/2019      Wt Readings from Last 3 Encounters:  05/27/19 163 lb (73.9 kg)  04/28/19 160 lb (72.6 kg)  04/25/19 161 lb 6.4 oz (73.2 kg)     ASSESSMENT AND PLAN:  Preop cardiovascular evaluation Acceptable risk for surgery right shoulder Instructions given to hold several of her medications evening before and morning of the procedure to allow for higher blood pressure before anesthesia  NSVT (nonsustained ventricular tachycardia) (HCC) No recent episodes, It would seem she decrease the carvedilol down to 12.5 mg twice daily on  her own, our office visit was taking 18.75 twice daily We will continue to 12.5 twice daily No further work-up  NICM (nonischemic cardiomyopathy) ([place]) Most recent echocardiogram showing normalized ejection fraction Low blood pressure, will continue current medications for now she is asymptomatic  Chronic systolic heart failure ([place]) - Plan: EKG 12-Lead Continue current medications, euvolemic  SVT (supraventricular tachycardia) ([place]) Denies any tachycardia or palpitations Continue Coreg 12.5 twice daily   Disposition:   F/U  12 months   Total encounter time more than 25 minutes  Greater than 50% was spent in counseling and coordination of care with the patient     Signed, Esmond Plants, M.D., Ph.D. 05/27/2019  [place], [place]

## 2019-05-27 NOTE — Patient Instructions (Addendum)
Medication Instructions:   Coreg is 12.5 mg twice a day   Hold the lisinopril night before surgery Hold the spironolactone and lasix morning of the surgery Take 1/2 coreg pill morning of the surgery  If you need a refill on your cardiac medications before your next appointment, please call your pharmacy.    Lab work: No new labs needed   If you have labs (blood work) drawn today and your tests are completely normal, you will receive your results only by: Marland Kitchen MyChart Message (if you have MyChart) OR . A paper copy in the mail If you have any lab test that is abnormal or we need to change your treatment, we will call you to review the results.   Testing/Procedures: No new testing needed   Follow-Up: At [facility], you and your health needs are our priority.  As part of our continuing mission to provide you with exceptional heart care, we have created designated Provider Care Teams.  These Care Teams include your primary Cardiologist (physician) and Advanced Practice Providers (APPs -  Physician Assistants and Nurse Practitioners) who all work together to provide you with the care you need, when you need it.  . You will need a follow up appointment in 12 months .   Please call our office 2 months in advance to schedule this appointment.    . Providers on your designated Care Team:   . Murray Hodgkins, NP . Christell Faith, PA-C . Marrianne Mood, PA-C  Any Other Special Instructions Will Be Listed Below (If Applicable).  For educational health videos Log in to : [email or web address] Or : [email or web address], password : [email or web address]

## 2019-05-28 NOTE — Telephone Encounter (Signed)
Dr. Donivan Scull note faxed. I will remove from pool.

## 2019-06-02 DIAGNOSIS — M75121 Complete rotator cuff tear or rupture of right shoulder, not specified as traumatic: Secondary | ICD-10-CM | POA: Diagnosis not present

## 2019-06-02 DIAGNOSIS — M7542 Impingement syndrome of left shoulder: Secondary | ICD-10-CM | POA: Diagnosis not present

## 2019-06-02 DIAGNOSIS — M7582 Other shoulder lesions, left shoulder: Secondary | ICD-10-CM | POA: Diagnosis not present

## 2019-06-09 ENCOUNTER — Other Ambulatory Visit: Payer: Self-pay

## 2019-06-09 ENCOUNTER — Encounter
Admission: RE | Admit: 2019-06-09 | Discharge: 2019-06-09 | Disposition: A | Discharge status: Home / Self Care | Payer: Medicare Other | Source: Ambulatory Visit | Attending: Surgery | Admitting: Surgery

## 2019-06-09 DIAGNOSIS — Z01812 Encounter for preprocedural laboratory examination: Secondary | ICD-10-CM | POA: Diagnosis not present

## 2019-06-09 DIAGNOSIS — M75121 Complete rotator cuff tear or rupture of right shoulder, not specified as traumatic: Secondary | ICD-10-CM | POA: Insufficient documentation

## 2019-06-09 HISTORY — DX: Urinary tract infection, site not specified: N39.0

## 2019-06-09 HISTORY — DX: Unspecified atrial fibrillation: I48.91

## 2019-06-09 HISTORY — DX: Other allergic rhinitis: J30.89

## 2019-06-09 HISTORY — DX: Thyrotoxicosis, unspecified without thyrotoxic crisis or storm: E05.90

## 2019-06-09 LAB — SURGICAL PCR SCREEN
MRSA, PCR: NEGATIVE
Staphylococcus aureus: NEGATIVE

## 2019-06-09 NOTE — Patient Instructions (Signed)
Your procedure is scheduled on: 06/17/2019 Tues Report to Same Day Surgery 2nd floor medical mall [facility] [place] Entrance-take elevator on left to 2nd floor.  Check in with surgery information desk.) To find out your arrival time please call [phone] between 1PM - 3PM on 06/16/2019 Mon  Remember: Instructions that are not followed completely may result in serious medical risk, up to and including death, or upon the discretion of your surgeon and anesthesiologist your surgery may need to be rescheduled.    _x___ 1. Do not eat food after midnight the night before your procedure. You may drink clear liquids up to 2 hours before you are scheduled to arrive at the hospital for your procedure.  Do not drink clear liquids within 2 hours of your scheduled arrival to the hospital.  Clear liquids include  --Water or Apple juice without pulp  --Clear carbohydrate beverage such as ClearFast or Gatorade  --Black Coffee or Clear Tea (No milk, no creamers, do not add anything to                  the coffee or Tea Type 1 and type 2 diabetics should only drink water.   ____Ensure clear carbohydrate drink on the way to the hospital for bariatric patients  ____Ensure clear carbohydrate drink 3 hours before surgery.   No gum chewing or hard candies.     __x__ 2. No Alcohol for 24 hours before or after surgery.   __x__3. No Smoking or e-cigarettes for 24 prior to surgery.  Do not use any chewable tobacco products for at least 6 hour prior to surgery   ____  4. Bring all medications with you on the day of surgery if instructed.    __x__ 5. Notify your doctor if there is any change in your medical condition     (cold, fever, infections).    x___6. On the morning of surgery brush your teeth with toothpaste and water.  You may rinse your mouth with mouth wash if you wish.  Do not swallow any toothpaste or mouthwash.   Do not wear jewelry, make-up, hairpins, clips or nail polish.  Do not wear lotions,  powders, or perfumes. You may wear deodorant.  Do not shave 48 hours prior to surgery. Men may shave face and neck.  Do not bring valuables to the hospital.    [facility] is not responsible for any belongings or valuables.               Contacts, dentures or bridgework may not be worn into surgery.  Leave your suitcase in the car. After surgery it may be brought to your room.  For patients admitted to the hospital, discharge time is determined by your                       treatment team.  _  Patients discharged the day of surgery will not be allowed to drive home.  You will need someone to drive you home and stay with you the night of your procedure.    Please read over the following fact sheets that you were given:   [facility] Preparing for Surgery and or MRSA Information   _x___ Take anti-hypertensive listed below, cardiac, seizure, asthma,     anti-reflux and psychiatric medicines. These include:  1. carvedilol (COREG) 12.5 MG tablet  2.methimazole (TAPAZOLE) 10 MG tabl  3.pantoprazole (PROTONIX) 40 MG tablet  4.  5.  6.  ____Fleets enema  or Magnesium Citrate as directed.   _x___ Use CHG Soap or sage wipes as directed on instruction sheet   ____ Use inhalers on the day of surgery and bring to hospital day of surgery  ____ Stop Metformin and Janumet 2 days prior to surgery.    ____ Take 1/2 of usual insulin dose the night before surgery and none on the morning     surgery.   _x___ Follow recommendations from Cardiologist, Pulmonologist or PCP regarding          stopping Aspirin, Coumadin, Plavix ,Eliquis, Effient, or Pradaxa, and Pletal.  X____Stop Anti-inflammatories such as Advil, Aleve, Ibuprofen, Motrin, Naproxen, Naprosyn, Goodies powders or aspirin products. OK to take Tylenol and                          Celebrex.   _x___ Stop supplements until after surgery.  But may continue Vitamin D, Vitamin B,       and multivitamin.   ____ Bring C-Pap to the hospital.

## 2019-06-09 NOTE — Pre-Procedure Instructions (Signed)
Asked Dr Roland Rack if antibiotic needed.

## 2019-06-13 ENCOUNTER — Other Ambulatory Visit: Payer: Self-pay

## 2019-06-13 ENCOUNTER — Other Ambulatory Visit
Admission: RE | Admit: 2019-06-13 | Discharge: 2019-06-13 | Disposition: A | Discharge status: Home / Self Care | Payer: Medicare Other | Source: Ambulatory Visit | Attending: Surgery | Admitting: Surgery

## 2019-06-13 DIAGNOSIS — Z20828 Contact with and (suspected) exposure to other viral communicable diseases: Secondary | ICD-10-CM | POA: Diagnosis not present

## 2019-06-13 DIAGNOSIS — Z01812 Encounter for preprocedural laboratory examination: Secondary | ICD-10-CM | POA: Insufficient documentation

## 2019-06-13 LAB — CBC
HCT: 43.4 % (ref 36.0–46.0)
Hemoglobin: 13.3 g/dL (ref 12.0–15.0)
MCH: 27.4 pg (ref 26.0–34.0)
MCHC: 30.6 g/dL (ref 30.0–36.0)
MCV: 89.5 fL (ref 80.0–100.0)
Platelets: 243 10*3/uL (ref 150–400)
RBC: 4.85 MIL/uL (ref 3.87–5.11)
RDW: 13.8 % (ref 11.5–15.5)
WBC: 10.7 10*3/uL — ABNORMAL HIGH (ref 4.0–10.5)
nRBC: 0 % (ref 0.0–0.2)

## 2019-06-13 LAB — SARS CORONAVIRUS 2 (TAT 6-24 HRS): SARS Coronavirus 2: NEGATIVE

## 2019-06-17 ENCOUNTER — Inpatient Hospital Stay: Payer: Medicare Other | Admitting: Anesthesiology

## 2019-06-17 ENCOUNTER — Ambulatory Visit: Payer: Self-pay

## 2019-06-17 ENCOUNTER — Other Ambulatory Visit: Payer: Self-pay

## 2019-06-17 ENCOUNTER — Encounter: Payer: Self-pay | Admitting: *Deleted

## 2019-06-17 ENCOUNTER — Encounter
Admission: RE | Disposition: A | Discharge status: Home Health | Payer: Self-pay | Source: Home / Self Care | Attending: Surgery

## 2019-06-17 ENCOUNTER — Inpatient Hospital Stay: Payer: Medicare Other

## 2019-06-17 ENCOUNTER — Inpatient Hospital Stay
Admission: RE | Admit: 2019-06-17 | Discharge: 2019-06-18 | DRG: 483 | Disposition: A | Discharge status: Home Health | Payer: Medicare Other | Attending: Surgery | Admitting: Surgery

## 2019-06-17 DIAGNOSIS — M75121 Complete rotator cuff tear or rupture of right shoulder, not specified as traumatic: Secondary | ICD-10-CM | POA: Diagnosis present

## 2019-06-17 DIAGNOSIS — Z96611 Presence of right artificial shoulder joint: Secondary | ICD-10-CM | POA: Diagnosis not present

## 2019-06-17 DIAGNOSIS — I4891 Unspecified atrial fibrillation: Secondary | ICD-10-CM | POA: Diagnosis present

## 2019-06-17 DIAGNOSIS — K5901 Slow transit constipation: Secondary | ICD-10-CM | POA: Diagnosis present

## 2019-06-17 DIAGNOSIS — Z85828 Personal history of other malignant neoplasm of skin: Secondary | ICD-10-CM

## 2019-06-17 DIAGNOSIS — Z471 Aftercare following joint replacement surgery: Secondary | ICD-10-CM | POA: Diagnosis not present

## 2019-06-17 DIAGNOSIS — M7581 Other shoulder lesions, right shoulder: Secondary | ICD-10-CM | POA: Diagnosis not present

## 2019-06-17 DIAGNOSIS — I428 Other cardiomyopathies: Secondary | ICD-10-CM | POA: Diagnosis present

## 2019-06-17 DIAGNOSIS — Z791 Long term (current) use of non-steroidal anti-inflammatories (NSAID): Secondary | ICD-10-CM | POA: Diagnosis not present

## 2019-06-17 DIAGNOSIS — I110 Hypertensive heart disease with heart failure: Secondary | ICD-10-CM | POA: Diagnosis not present

## 2019-06-17 DIAGNOSIS — Z7982 Long term (current) use of aspirin: Secondary | ICD-10-CM | POA: Diagnosis not present

## 2019-06-17 DIAGNOSIS — M25511 Pain in right shoulder: Secondary | ICD-10-CM | POA: Diagnosis not present

## 2019-06-17 DIAGNOSIS — M778 Other enthesopathies, not elsewhere classified: Secondary | ICD-10-CM | POA: Diagnosis not present

## 2019-06-17 DIAGNOSIS — K219 Gastro-esophageal reflux disease without esophagitis: Secondary | ICD-10-CM | POA: Diagnosis present

## 2019-06-17 DIAGNOSIS — E785 Hyperlipidemia, unspecified: Secondary | ICD-10-CM | POA: Diagnosis present

## 2019-06-17 DIAGNOSIS — I509 Heart failure, unspecified: Secondary | ICD-10-CM | POA: Diagnosis present

## 2019-06-17 DIAGNOSIS — M12811 Other specific arthropathies, not elsewhere classified, right shoulder: Secondary | ICD-10-CM | POA: Diagnosis not present

## 2019-06-17 DIAGNOSIS — G8918 Other acute postprocedural pain: Secondary | ICD-10-CM | POA: Diagnosis not present

## 2019-06-17 DIAGNOSIS — I471 Supraventricular tachycardia: Secondary | ICD-10-CM | POA: Diagnosis not present

## 2019-06-17 HISTORY — PX: REVERSE SHOULDER ARTHROPLASTY: SHX5054

## 2019-06-17 SURGERY — ARTHROPLASTY, SHOULDER, TOTAL, REVERSE
Anesthesia: General | Site: Shoulder | Laterality: Right

## 2019-06-17 MED ORDER — ROCURONIUM BROMIDE 100 MG/10ML IV SOLN
INTRAVENOUS | Status: DC | PRN
  Administered 2019-06-17: 80 mg via INTRAVENOUS

## 2019-06-17 MED ORDER — SUGAMMADEX SODIUM 200 MG/2ML IV SOLN
INTRAVENOUS | Status: AC
  Filled 2019-06-17: qty 2

## 2019-06-17 MED ORDER — FUROSEMIDE 20 MG PO TABS
20.0000 mg | ORAL_TABLET | ORAL | Status: DC
  Administered 2019-06-18: 20 mg via ORAL
  Filled 2019-06-17: qty 1

## 2019-06-17 MED ORDER — BUPIVACAINE HCL (PF) 0.5 % IJ SOLN
INTRAMUSCULAR | Status: DC | PRN
  Administered 2019-06-17: 10 mL via PERINEURAL

## 2019-06-17 MED ORDER — DOCUSATE SODIUM 100 MG PO CAPS
100.0000 mg | ORAL_CAPSULE | Freq: Two times a day (BID) | ORAL | Status: DC
  Administered 2019-06-17 – 2019-06-18 (×2): 100 mg via ORAL
  Filled 2019-06-17 (×2): qty 1

## 2019-06-17 MED ORDER — PANTOPRAZOLE SODIUM 40 MG PO TBEC
40.0000 mg | DELAYED_RELEASE_TABLET | Freq: Every day | ORAL | Status: DC
  Administered 2019-06-18: 09:00:00 40 mg via ORAL
  Filled 2019-06-17: qty 1

## 2019-06-17 MED ORDER — ONDANSETRON HCL 4 MG/2ML IJ SOLN
INTRAMUSCULAR | Status: DC | PRN
  Administered 2019-06-17: 4 mg via INTRAVENOUS

## 2019-06-17 MED ORDER — LORATADINE 10 MG PO TABS
10.0000 mg | ORAL_TABLET | Freq: Every day | ORAL | Status: DC
  Administered 2019-06-17: 10 mg via ORAL
  Filled 2019-06-17 (×2): qty 1

## 2019-06-17 MED ORDER — ONDANSETRON HCL 4 MG PO TABS: 4.0000 mg | ORAL_TABLET | Freq: Four times a day (QID) | ORAL | Status: DC | PRN

## 2019-06-17 MED ORDER — SUMATRIPTAN SUCCINATE 50 MG PO TABS
100.0000 mg | ORAL_TABLET | ORAL | Status: DC | PRN
  Administered 2019-06-18: 100 mg via ORAL
  Filled 2019-06-17 (×2): qty 2

## 2019-06-17 MED ORDER — TRANEXAMIC ACID 1000 MG/10ML IV SOLN
INTRAVENOUS | Status: DC | PRN
  Administered 2019-06-17: 1000 mg via TOPICAL

## 2019-06-17 MED ORDER — LIDOCAINE HCL (PF) 1 % IJ SOLN
INTRAMUSCULAR | Status: AC
  Filled 2019-06-17: qty 5

## 2019-06-17 MED ORDER — MIDAZOLAM HCL 2 MG/2ML IJ SOLN
1.0000 mg | Freq: Once | INTRAMUSCULAR | Status: AC
  Administered 2019-06-17: 09:00:00 1 mg via INTRAVENOUS

## 2019-06-17 MED ORDER — KETOROLAC TROMETHAMINE 15 MG/ML IJ SOLN
7.5000 mg | Freq: Four times a day (QID) | INTRAMUSCULAR | Status: DC
  Administered 2019-06-17 – 2019-06-18 (×3): 7.5 mg via INTRAVENOUS
  Filled 2019-06-17 (×3): qty 1

## 2019-06-17 MED ORDER — BUPIVACAINE HCL (PF) 0.5 % IJ SOLN
INTRAMUSCULAR | Status: AC
  Filled 2019-06-17: qty 10

## 2019-06-17 MED ORDER — EPINEPHRINE PF 1 MG/ML IJ SOLN
INTRAMUSCULAR | Status: AC
  Filled 2019-06-17: qty 1

## 2019-06-17 MED ORDER — SODIUM CHLORIDE (PF) 0.9 % IJ SOLN
INTRAMUSCULAR | Status: AC
  Filled 2019-06-17: qty 50

## 2019-06-17 MED ORDER — ROCURONIUM BROMIDE 50 MG/5ML IV SOLN
INTRAVENOUS | Status: AC
  Filled 2019-06-17: qty 2

## 2019-06-17 MED ORDER — BUPIVACAINE LIPOSOME 1.3 % IJ SUSP
INTRAMUSCULAR | Status: AC
  Filled 2019-06-17: qty 20

## 2019-06-17 MED ORDER — FENTANYL CITRATE (PF) 100 MCG/2ML IJ SOLN: 25.0000 ug | INTRAMUSCULAR | Status: DC | PRN

## 2019-06-17 MED ORDER — TRAMADOL HCL 50 MG PO TABS
50.0000 mg | ORAL_TABLET | Freq: Four times a day (QID) | ORAL | Status: DC | PRN
  Administered 2019-06-17: 50 mg via ORAL
  Filled 2019-06-17: qty 1

## 2019-06-17 MED ORDER — FENTANYL CITRATE (PF) 100 MCG/2ML IJ SOLN
50.0000 ug | Freq: Once | INTRAMUSCULAR | Status: AC
  Administered 2019-06-17: 09:00:00 50 ug via INTRAVENOUS

## 2019-06-17 MED ORDER — SODIUM CHLORIDE 0.9 % IV SOLN
INTRAVENOUS | Status: DC
  Administered 2019-06-17: 15:00:00 via INTRAVENOUS

## 2019-06-17 MED ORDER — PROPOFOL 10 MG/ML IV BOLUS
INTRAVENOUS | Status: AC
  Filled 2019-06-17: qty 20

## 2019-06-17 MED ORDER — ACETAMINOPHEN 325 MG PO TABS: 325.0000 mg | ORAL_TABLET | Freq: Four times a day (QID) | ORAL | Status: DC | PRN

## 2019-06-17 MED ORDER — DIPHENHYDRAMINE HCL 12.5 MG/5ML PO ELIX: 12.5000 mg | ORAL_SOLUTION | ORAL | Status: DC | PRN

## 2019-06-17 MED ORDER — METOCLOPRAMIDE HCL 10 MG PO TABS: 5.0000 mg | ORAL_TABLET | Freq: Three times a day (TID) | ORAL | Status: DC | PRN

## 2019-06-17 MED ORDER — LIDOCAINE HCL (PF) 2 % IJ SOLN
INTRAMUSCULAR | Status: AC
  Filled 2019-06-17: qty 10

## 2019-06-17 MED ORDER — KETOROLAC TROMETHAMINE 15 MG/ML IJ SOLN
INTRAMUSCULAR | Status: AC
  Administered 2019-06-17: 19:00:00 15 mg
  Filled 2019-06-17: qty 1

## 2019-06-17 MED ORDER — ENOXAPARIN SODIUM 40 MG/0.4ML ~~LOC~~ SOLN
40.0000 mg | SUBCUTANEOUS | Status: DC
  Administered 2019-06-18: 09:00:00 40 mg via SUBCUTANEOUS
  Filled 2019-06-17: qty 0.4

## 2019-06-17 MED ORDER — CALCIUM CARBONATE ANTACID 500 MG PO CHEW: 1.0000 | CHEWABLE_TABLET | Freq: Every day | ORAL | Status: DC | PRN

## 2019-06-17 MED ORDER — FENTANYL CITRATE (PF) 100 MCG/2ML IJ SOLN
INTRAMUSCULAR | Status: AC
  Administered 2019-06-17: 50 ug via INTRAVENOUS
  Filled 2019-06-17: qty 2

## 2019-06-17 MED ORDER — SIMVASTATIN 20 MG PO TABS
10.0000 mg | ORAL_TABLET | Freq: Every evening | ORAL | Status: DC
  Administered 2019-06-17: 17:00:00 10 mg via ORAL
  Filled 2019-06-17: qty 1

## 2019-06-17 MED ORDER — HYDROMORPHONE HCL 1 MG/ML IJ SOLN: 0.2500 mg | INTRAMUSCULAR | Status: DC | PRN

## 2019-06-17 MED ORDER — SPIRONOLACTONE 25 MG PO TABS
12.5000 mg | ORAL_TABLET | Freq: Every day | ORAL | Status: DC
  Administered 2019-06-18: 12.5 mg via ORAL
  Filled 2019-06-17 (×2): qty 1
  Filled 2019-06-17 (×2): qty 0.5

## 2019-06-17 MED ORDER — BISACODYL 10 MG RE SUPP: 10.0000 mg | Freq: Every day | RECTAL | Status: DC | PRN

## 2019-06-17 MED ORDER — LISINOPRIL 5 MG PO TABS
5.0000 mg | ORAL_TABLET | Freq: Every day | ORAL | Status: DC
  Filled 2019-06-17: qty 1

## 2019-06-17 MED ORDER — ONDANSETRON HCL 4 MG/2ML IJ SOLN
INTRAMUSCULAR | Status: AC
  Filled 2019-06-17: qty 2

## 2019-06-17 MED ORDER — MAGNESIUM HYDROXIDE 400 MG/5ML PO SUSP: 30.0000 mL | Freq: Every day | ORAL | Status: DC | PRN

## 2019-06-17 MED ORDER — CARVEDILOL 12.5 MG PO TABS
12.5000 mg | ORAL_TABLET | Freq: Two times a day (BID) | ORAL | Status: DC
  Administered 2019-06-18: 12.5 mg via ORAL
  Filled 2019-06-17 (×2): qty 1

## 2019-06-17 MED ORDER — PROPOFOL 10 MG/ML IV BOLUS
INTRAVENOUS | Status: DC | PRN
  Administered 2019-06-17: 140 mg via INTRAVENOUS

## 2019-06-17 MED ORDER — SODIUM CHLORIDE FLUSH 0.9 % IV SOLN
INTRAVENOUS | Status: AC
  Filled 2019-06-17: qty 10

## 2019-06-17 MED ORDER — ONDANSETRON HCL 4 MG/2ML IJ SOLN: 4.0000 mg | Freq: Once | INTRAMUSCULAR | Status: DC | PRN

## 2019-06-17 MED ORDER — FENTANYL CITRATE (PF) 100 MCG/2ML IJ SOLN
INTRAMUSCULAR | Status: DC | PRN
  Administered 2019-06-17 (×3): 50 ug via INTRAVENOUS
  Administered 2019-06-17 (×2): 25 ug via INTRAVENOUS

## 2019-06-17 MED ORDER — MIDAZOLAM HCL 2 MG/2ML IJ SOLN
INTRAMUSCULAR | Status: AC
  Administered 2019-06-17: 09:00:00 1 mg via INTRAVENOUS
  Filled 2019-06-17: qty 2

## 2019-06-17 MED ORDER — KETOROLAC TROMETHAMINE 15 MG/ML IJ SOLN
15.0000 mg | Freq: Once | INTRAMUSCULAR | Status: AC
  Administered 2019-06-17: 15 mg via INTRAVENOUS

## 2019-06-17 MED ORDER — DEXAMETHASONE SODIUM PHOSPHATE 4 MG/ML IJ SOLN
INTRAMUSCULAR | Status: DC | PRN
  Administered 2019-06-17: 5 mg via INTRAVENOUS

## 2019-06-17 MED ORDER — CEFAZOLIN SODIUM-DEXTROSE 2-4 GM/100ML-% IV SOLN
2.0000 g | Freq: Four times a day (QID) | INTRAVENOUS | Status: AC
  Administered 2019-06-17 – 2019-06-18 (×2): 2 g via INTRAVENOUS
  Filled 2019-06-17 (×4): qty 100

## 2019-06-17 MED ORDER — BUPIVACAINE HCL (PF) 0.5 % IJ SOLN
INTRAMUSCULAR | Status: AC
  Filled 2019-06-17: qty 30

## 2019-06-17 MED ORDER — METOCLOPRAMIDE HCL 5 MG/ML IJ SOLN: 5.0000 mg | Freq: Three times a day (TID) | INTRAMUSCULAR | Status: DC | PRN

## 2019-06-17 MED ORDER — TRANEXAMIC ACID 1000 MG/10ML IV SOLN
INTRAVENOUS | Status: AC
  Filled 2019-06-17: qty 10

## 2019-06-17 MED ORDER — LIDOCAINE HCL (PF) 1 % IJ SOLN
INTRAMUSCULAR | Status: DC | PRN
  Administered 2019-06-17: 3 mL

## 2019-06-17 MED ORDER — ONDANSETRON HCL 4 MG/2ML IJ SOLN
4.0000 mg | Freq: Four times a day (QID) | INTRAMUSCULAR | Status: DC | PRN
  Administered 2019-06-17: 15:00:00 4 mg via INTRAVENOUS
  Filled 2019-06-17: qty 2

## 2019-06-17 MED ORDER — GLYCOPYRROLATE 0.2 MG/ML IJ SOLN
INTRAMUSCULAR | Status: AC
  Filled 2019-06-17: qty 1

## 2019-06-17 MED ORDER — FENTANYL CITRATE (PF) 250 MCG/5ML IJ SOLN
INTRAMUSCULAR | Status: AC
  Filled 2019-06-17: qty 5

## 2019-06-17 MED ORDER — LACTATED RINGERS IV SOLN
INTRAVENOUS | Status: DC
  Administered 2019-06-17 (×2): via INTRAVENOUS

## 2019-06-17 MED ORDER — METHIMAZOLE 5 MG PO TABS
5.0000 mg | ORAL_TABLET | Freq: Every day | ORAL | Status: DC
  Administered 2019-06-18: 5 mg via ORAL
  Filled 2019-06-17: qty 1

## 2019-06-17 MED ORDER — ACETAMINOPHEN 500 MG PO TABS
1000.0000 mg | ORAL_TABLET | Freq: Four times a day (QID) | ORAL | Status: DC
  Administered 2019-06-17 – 2019-06-18 (×3): 1000 mg via ORAL
  Filled 2019-06-17 (×3): qty 2

## 2019-06-17 MED ORDER — BUPIVACAINE LIPOSOME 1.3 % IJ SUSP
INTRAMUSCULAR | Status: DC | PRN
  Administered 2019-06-17: 20 mL via PERINEURAL

## 2019-06-17 MED ORDER — ZONISAMIDE 100 MG PO CAPS
100.0000 mg | ORAL_CAPSULE | Freq: Every evening | ORAL | Status: DC
  Administered 2019-06-17: 17:00:00 100 mg via ORAL
  Filled 2019-06-17 (×2): qty 1

## 2019-06-17 MED ORDER — OXYCODONE HCL 5 MG PO TABS: 5.0000 mg | ORAL_TABLET | ORAL | Status: DC | PRN

## 2019-06-17 MED ORDER — CALCIUM CARBONATE ANTACID 500 MG PO CHEW
1500.0000 mg | CHEWABLE_TABLET | Freq: Every day | ORAL | Status: DC
  Administered 2019-06-17 – 2019-06-18 (×2): 1500 mg via ORAL
  Filled 2019-06-17 (×2): qty 8

## 2019-06-17 MED ORDER — GLYCOPYRROLATE 0.2 MG/ML IJ SOLN
INTRAMUSCULAR | Status: DC | PRN
  Administered 2019-06-17: 0.2 mg via INTRAVENOUS

## 2019-06-17 MED ORDER — PHENYLEPHRINE HCL (PRESSORS) 10 MG/ML IV SOLN
INTRAVENOUS | Status: DC | PRN
  Administered 2019-06-17: 100 ug via INTRAVENOUS

## 2019-06-17 MED ORDER — TRIAMCINOLONE ACETONIDE 55 MCG/ACT NA AERO
1.0000 | INHALATION_SPRAY | Freq: Every day | NASAL | Status: DC | PRN
  Filled 2019-06-17: qty 10.8

## 2019-06-17 MED ORDER — CEFAZOLIN SODIUM-DEXTROSE 2-4 GM/100ML-% IV SOLN
2.0000 g | Freq: Once | INTRAVENOUS | Status: AC
  Administered 2019-06-17: 2 g via INTRAVENOUS

## 2019-06-17 MED ORDER — PHENYLEPHRINE HCL-NACL 10-0.9 MG/250ML-% IV SOLN
INTRAVENOUS | Status: DC | PRN
  Administered 2019-06-17: 30 ug/min via INTRAVENOUS

## 2019-06-17 MED ORDER — CEFAZOLIN SODIUM-DEXTROSE 2-4 GM/100ML-% IV SOLN
INTRAVENOUS | Status: AC
  Filled 2019-06-17: qty 100

## 2019-06-17 MED ORDER — FLEET ENEMA 7-19 GM/118ML RE ENEM: 1.0000 | ENEMA | Freq: Once | RECTAL | Status: DC | PRN

## 2019-06-17 MED ORDER — VITAMIN D 25 MCG (1000 UNIT) PO TABS
1000.0000 [IU] | ORAL_TABLET | Freq: Every day | ORAL | Status: DC
  Administered 2019-06-17 – 2019-06-18 (×2): 1000 [IU] via ORAL
  Filled 2019-06-17 (×2): qty 1

## 2019-06-17 MED ORDER — SUGAMMADEX SODIUM 200 MG/2ML IV SOLN
INTRAVENOUS | Status: DC | PRN
  Administered 2019-06-17: 200 mg via INTRAVENOUS

## 2019-06-17 SURGICAL SUPPLY — 69 items
BASEPLATE GLENOSPHERE 25 (Plate) ×1 IMPLANT
BASEPLATE GLENOSPHERE 25MM (Plate) ×1 IMPLANT
BEARING HUMERAL SHLDER 36M STD (Shoulder) IMPLANT
BIT DRILL TWIST 2.7 (BIT) ×1 IMPLANT
BIT DRILL TWIST 2.7MM (BIT) ×1
BLADE SAW SAG 25X90X1.19 (BLADE) ×3 IMPLANT
CANISTER SUCT 1200ML W/VALVE (MISCELLANEOUS) ×3 IMPLANT
CANISTER SUCT 3000ML PPV (MISCELLANEOUS) ×6 IMPLANT
CHLORAPREP W/TINT 26 (MISCELLANEOUS) ×3 IMPLANT
COOLER POLAR GLACIER W/PUMP (MISCELLANEOUS) ×3 IMPLANT
COVER BACK TABLE REUSABLE LG (DRAPES) ×3 IMPLANT
COVER WAND RF STERILE (DRAPES) ×3 IMPLANT
CRADLE LAMINECT ARM (MISCELLANEOUS) ×3 IMPLANT
DRAPE 3/4 80X56 (DRAPES) ×6 IMPLANT
DRAPE INCISE IOBAN 66X45 STRL (DRAPES) ×6 IMPLANT
DRAPE SPLIT 6X30 W/TAPE (DRAPES) ×6 IMPLANT
DRSG OPSITE POSTOP 4X8 (GAUZE/BANDAGES/DRESSINGS) ×3 IMPLANT
ELECT BLADE 6.5 EXT (BLADE) IMPLANT
ELECT CAUTERY BLADE 6.4 (BLADE) ×3 IMPLANT
GLENOID SPHERE STD STRL 36MM (Orthopedic Implant) ×2 IMPLANT
GLOVE BIO SURGEON STRL SZ7.5 (GLOVE) ×12 IMPLANT
GLOVE BIO SURGEON STRL SZ8 (GLOVE) ×12 IMPLANT
GLOVE BIOGEL PI IND STRL 7.5 (GLOVE) IMPLANT
GLOVE BIOGEL PI IND STRL 8 (GLOVE) ×1 IMPLANT
GLOVE BIOGEL PI INDICATOR 7.5 (GLOVE) ×12
GLOVE BIOGEL PI INDICATOR 8 (GLOVE) ×2
GLOVE INDICATOR 8.0 STRL GRN (GLOVE) ×3 IMPLANT
GOWN STRL REUS W/ TWL LRG LVL3 (GOWN DISPOSABLE) ×1 IMPLANT
GOWN STRL REUS W/ TWL XL LVL3 (GOWN DISPOSABLE) ×1 IMPLANT
GOWN STRL REUS W/TWL LRG LVL3 (GOWN DISPOSABLE) ×2
GOWN STRL REUS W/TWL XL LVL3 (GOWN DISPOSABLE) ×4
HOOD PEEL AWAY FLYTE STAYCOOL (MISCELLANEOUS) ×11 IMPLANT
KIT STABILIZATION SHOULDER (MISCELLANEOUS) ×3 IMPLANT
KIT TURNOVER KIT A (KITS) ×3 IMPLANT
MASK FACE SPIDER DISP (MASK) ×3 IMPLANT
MAT ABSORB  FLUID 56X50 GRAY (MISCELLANEOUS) ×2
MAT ABSORB FLUID 56X50 GRAY (MISCELLANEOUS) ×1 IMPLANT
NDL SAFETY ECLIPSE 18X1.5 (NEEDLE) ×1 IMPLANT
NDL SPNL 20GX3.5 QUINCKE YW (NEEDLE) ×1 IMPLANT
NEEDLE HYPO 18GX1.5 SHARP (NEEDLE) ×2
NEEDLE HYPO 22GX1.5 SAFETY (NEEDLE) ×3 IMPLANT
NEEDLE SPNL 20GX3.5 QUINCKE YW (NEEDLE) ×3 IMPLANT
NS IRRIG 500ML POUR BTL (IV SOLUTION) ×3 IMPLANT
PACK ARTHROSCOPY SHOULDER (MISCELLANEOUS) ×3 IMPLANT
PAD WRAPON POLAR SHDR UNIV (MISCELLANEOUS) ×1 IMPLANT
PIN THREADED REVERSE (PIN) ×2 IMPLANT
PULSAVAC PLUS IRRIG FAN TIP (DISPOSABLE) ×3
SCREW BONE LOCKING 4.75X30X3.5 (Screw) ×2 IMPLANT
SCREW BONE LOCKING 4.75X35X3.5 (Screw) ×2 IMPLANT
SCREW BONE STRL 6.5MMX25MM (Screw) ×2 IMPLANT
SCREW NON-LOCK 4.75MMX15MM (Screw) ×4 IMPLANT
SHOULDER HUMERAL BEAR 36M STD (Shoulder) ×3 IMPLANT
SLING ULTRA II M (MISCELLANEOUS) ×3 IMPLANT
SOL .9 NS 3000ML IRR  AL (IV SOLUTION) ×2
SOL .9 NS 3000ML IRR UROMATIC (IV SOLUTION) ×1 IMPLANT
SPONGE LAP 18X18 RF (DISPOSABLE) ×3 IMPLANT
STAPLER SKIN PROX 35W (STAPLE) ×3 IMPLANT
STEM HUMERAL STRL 13MMX55MM (Stem) ×2 IMPLANT
SUT ETHIBOND 0 MO6 C/R (SUTURE) ×3 IMPLANT
SUT FIBERWIRE #2 38 BLUE 1/2 (SUTURE) ×12
SUT VIC AB 0 CT1 36 (SUTURE) ×3 IMPLANT
SUT VIC AB 2-0 CT1 27 (SUTURE) ×4
SUT VIC AB 2-0 CT1 TAPERPNT 27 (SUTURE) ×2 IMPLANT
SUTURE FIBERWR #2 38 BLUE 1/2 (SUTURE) ×4 IMPLANT
SYR 10ML LL (SYRINGE) ×3 IMPLANT
SYR 30ML LL (SYRINGE) IMPLANT
TIP FAN IRRIG PULSAVAC PLUS (DISPOSABLE) ×1 IMPLANT
TRAY HUM MINI SHOULDER +0 40D (Shoulder) ×2 IMPLANT
WRAPON POLAR PAD SHDR UNIV (MISCELLANEOUS) ×3

## 2019-06-17 NOTE — H&P (Signed)
Paper H&P to be scanned into permanent record. H&P reviewed and patient re-examined. No changes. 

## 2019-06-17 NOTE — Anesthesia Procedure Notes (Signed)
Procedure Name: Intubation Date/Time: 06/17/2019 10:46 AM Performed by: Bernardo Heater, CRNA Pre-anesthesia Checklist: Patient identified, Patient being monitored, Timeout performed, Emergency Drugs available and Suction available Patient Re-evaluated:Patient Re-evaluated prior to induction Oxygen Delivery Method: Circle system utilized Preoxygenation: Pre-oxygenation with 100% oxygen Induction Type: IV induction Laryngoscope Size: Mac and 3 Grade View: Grade I Tube type: Oral Tube size: 7.0 mm Number of attempts: 1 Placement Confirmation: ETT inserted through vocal cords under direct vision,  positive ETCO2 and breath sounds checked- equal and bilateral Secured at: 21 cm Tube secured with: Tape Dental Injury: Teeth and Oropharynx as per pre-operative assessment

## 2019-06-17 NOTE — Anesthesia Postprocedure Evaluation (Signed)
Anesthesia Post Note  Patient: ARSHDEEP ARRIAZA  Procedure(s) Performed: REVERSE SHOULDER ARTHROPLASTY (Right Shoulder)  Patient location during evaluation: PACU Anesthesia Type: General Level of consciousness: awake and alert and oriented Pain management: pain level controlled Vital Signs Assessment: post-procedure vital signs reviewed and stable Respiratory status: spontaneous breathing, nonlabored ventilation and respiratory function stable Cardiovascular status: blood pressure returned to baseline and stable Postop Assessment: no signs of nausea or vomiting Anesthetic complications: no     Last Vitals:  Vitals:   06/17/19 1345 06/17/19 1353  BP: 122/69   Pulse: 75 73  Resp: 14 13  Temp:  (!) 36.1 C  SpO2: 97% 96%    Last Pain:  Vitals:   06/17/19 1345  TempSrc:   PainSc: 0-No pain                 Deshun Sedivy

## 2019-06-17 NOTE — Anesthesia Procedure Notes (Signed)
Anesthesia Regional Block: Interscalene brachial plexus block   Pre-Anesthetic Checklist: ,, timeout performed, Correct Patient, Correct Site, Correct Laterality, Correct Procedure, Correct Position, site marked, Risks and benefits discussed,  Surgical consent,  Pre-op evaluation,  At surgeon's request and post-op pain management  Laterality: Right  Prep: chloraprep       Needles:  Injection technique: Single-shot  Needle Type: Stimiplex     Needle Length: 10cm  Needle Gauge: 21     Additional Needles:   Procedures:,,,, ultrasound used (permanent image in chart),,,,  Narrative:  Start time: 06/17/2019 9:19 AM End time: 06/17/2019 9:25 AM  Performed by: Personally  Anesthesiologist: Emmie Niemann, MD  Additional Notes: Functioning IV. Sedation provided as documented in [facility]. No parasthesias. Divided doses of local anesthetic as documented, negative aspiration between injections.

## 2019-06-17 NOTE — Transfer of Care (Signed)
Immediate Anesthesia Transfer of Care Note  Patient: NAVYA NELTON  Procedure(s) Performed: REVERSE SHOULDER ARTHROPLASTY (Right Shoulder)  Patient Location: PACU  Anesthesia Type:General  Level of Consciousness: awake, alert  and oriented  Airway & Oxygen Therapy: Patient Spontanous Breathing and Patient connected to nasal cannula oxygen  Post-op Assessment: Report given to RN and Post -op Vital signs reviewed and stable  Post vital signs: Reviewed and stable  Last Vitals:  Vitals Value Taken Time  BP 130/67 06/17/19 1312  Temp    Pulse 74 06/17/19 1313  Resp 13 06/17/19 1313  SpO2 96 % 06/17/19 1313  Vitals shown include unvalidated device data.  Last Pain:  Vitals:   06/17/19 0848  TempSrc: Tympanic  PainSc: 0-No pain      Patients Stated Pain Goal: 0 ([id number])  Complications: No apparent anesthesia complications

## 2019-06-17 NOTE — Progress Notes (Signed)
OT Cancellation Note  Patient Details Name: Kristi Greer MRN: [id number] DOB: [birth year]   Cancelled Treatment:    Reason Eval/Treat Not Completed: Other (comment) . Consult received, chart reviewed. Pt working with PT upon initial attempt. Will re-attempt OT evaluation next date as appropriate.   Jeni Salles, MPH, MS, OTR/L ascom [phone] 06/17/19, 4:27 PM

## 2019-06-17 NOTE — Evaluation (Signed)
Physical Therapy Evaluation Patient Details Name: Kristi Greer MRN: [id number] DOB: 25-Nov-1950 Today's Date: 06/17/2019   History of Present Illness  68 y/o female s/p R reverse total shoulder replacement 06/17/19.  Clinical Impression  Pt did well with POD0 PT exam and gait training. Answered many of pt and husband's questions about course of recovery, managing in the home and generally about surgery and expectations.  Pt awake and pleasant but did show bouts of fatigue, had some mild hypotension w/o limiting symptoms, O2 dropped to hig 80s and HR up to ~100 with ambulation; however she was able to safely circumambulate the nurses' station and negotiate up/down steps w/o physical assist.  She demonstrated and showed good understanding of distal R UE exercises as well as understanding to do no AROM in R shoulder at this time.  Pt feeling confident about being able to go home with assist from husband who can be available 24/7.    Follow Up Recommendations Follow surgeon's recommendation for DC plan and follow-up therapies;Home health PT    Equipment Recommendations  3in1 (PT)    Recommendations for Other Services       Precautions / Restrictions Precautions Precautions: Shoulder Type of Shoulder Precautions: total Shoulder Interventions: Shoulder sling/immobilizer Precaution Booklet Issued: Yes (comment)(HEP) Restrictions Weight Bearing Restrictions: Yes Other Position/Activity Restrictions: R UE NWBing, in sling       Mobility  Bed Mobility Overal bed mobility: Independent             General bed mobility comments: Pt was able to get to EOB w/o direct assist  Transfers Overall transfer level: Modified independent Equipment used: None             General transfer comment: Pt able to rise to standing and maintain balance w/o issue  Ambulation/Gait Ambulation/Gait assistance: Supervision Gait Distance (Feet): 250 Feet Assistive device: None        General Gait Details: Pt was able to circumambulate the nurses station with slow but safe and consistent cadence.  Pt with some nausea and lightheadedness, but did not require sitting, rest breaks, etc despite offering multiple times  Stairs Stairs: Yes Stairs assistance: Supervision Stair Management: One rail Left;Alternating pattern Number of Stairs: 4 General stair comments: Pt able to easily negotiate up/down steps with single L UE support, no safety concerns  Wheelchair Mobility    Modified Rankin (Stroke Patients Only)       Balance Overall balance assessment: Modified Independent(Pt cautious but with no overt LOBs or safety concerns)                                           Pertinent Vitals/Pain Pain Assessment: No/denies pain(shoulder still numb)    Home Living Family/patient expects to be discharged to:: Private residence Living Arrangements: Spouse/significant other Available Help at Discharge: Family;Available 24 hours/day   Home Access: Stairs to enter Entrance Stairs-Rails: Right;Left Entrance Stairs-Number of Steps: 5   Home Equipment: None      Prior Function Level of Independence: Independent         Comments: Pt with increasing issues with R shoulder, but able to do all necessary ADLs, etc     Hand Dominance        Extremity/Trunk Assessment   Upper Extremity Assessment Upper Extremity Assessment: (R UE not testing, in immobilizer POD0)    Lower Extremity Assessment Lower Extremity Assessment: Overall  WFL for tasks assessed       Communication   Communication: No difficulties  Cognition Arousal/Alertness: Awake/alert Behavior During Therapy: WFL for tasks assessed/performed Overall Cognitive Status: Within Functional Limits for tasks assessed                                 General Comments: Pt engaged and able to participate, some lethargy t/o the session      General Comments      Exercises      Assessment/Plan    PT Assessment Patient needs continued PT services  PT Problem List Decreased strength;Decreased range of motion;Decreased activity tolerance;Decreased balance;Decreased mobility;Decreased safety awareness;Decreased knowledge of precautions;Cardiopulmonary status limiting activity;Pain       PT Treatment Interventions DME instruction;Gait training;Stair training;Functional mobility training;Therapeutic activities;Therapeutic exercise;Balance training;Neuromuscular re-education;Patient/family education    PT Goals (Current goals can be found in the Care Plan section)  Acute Rehab PT Goals Patient Stated Goal: go home PT Goal Formulation: With patient Time For Goal Achievement: 07/01/19 Potential to Achieve Goals: Good    Frequency BID   Barriers to discharge        Co-evaluation               AM-PAC PT "6 Clicks" Mobility  Outcome Measure Help needed turning from your back to your side while in a flat bed without using bedrails?: None Help needed moving from lying on your back to sitting on the side of a flat bed without using bedrails?: None Help needed moving to and from a bed to a chair (including a wheelchair)?: None Help needed standing up from a chair using your arms (e.g., wheelchair or bedside chair)?: None Help needed to walk in hospital room?: A Little Help needed climbing 3-5 steps with a railing? : A Little 6 Click Score: 22    End of Session Equipment Utilized During Treatment: Gait belt Activity Tolerance: Patient tolerated treatment well(minimal fatigue and nasuea that didn't limit gross numbers) Patient left: with chair alarm set;with call bell/phone within reach;with family/visitor present Nurse Communication: Mobility status PT Visit Diagnosis: Pain;Difficulty in walking, not elsewhere classified (R26.2);Muscle weakness (generalized) (M62.81) Pain - Right/Left: Right Pain - part of body: Shoulder    Time: [id number] PT Time  Calculation (min) (ACUTE ONLY): 31 min   Charges:   PT Evaluation $PT Eval Low Complexity: 1 Low PT Treatments $Gait Training: 8-22 mins        Kreg Shropshire, DPT 06/17/2019, 5:21 PM

## 2019-06-17 NOTE — Anesthesia Preprocedure Evaluation (Signed)
Anesthesia Evaluation  Patient identified by MRN, date of birth, ID band Patient awake    Reviewed: Allergy & Precautions, NPO status , Patient's Chart, lab work & pertinent test results  History of Anesthesia Complications (+) PONV and history of anesthetic complications  Airway Mallampati: II  TM Distance: >3 FB Neck ROM: Full    Dental no notable dental hx.    Pulmonary neg pulmonary ROS, neg sleep apnea, neg COPD,    breath sounds clear to auscultation- rhonchi (-) wheezing      Cardiovascular hypertension, +CHF  (-) CAD, (-) Past MI, (-) Cardiac Stents and (-) CABG + dysrhythmias Atrial Fibrillation  Rhythm:Regular Rate:Normal - Systolic murmurs and - Diastolic murmurs Echo [id number]: Left ventricle: The cavity size was normal. Wall thickness was   normal. Systolic function was normal. The estimated ejection   fraction was in the range of 50% to 55%. Wall motion was normal;   there were no regional wall motion abnormalities. Features are   consistent with a pseudonormal left ventricular filling pattern,   with concomitant abnormal relaxation and increased filling   pressure (grade 2 diastolic dysfunction).   Neuro/Psych  Headaches, neg Seizures negative psych ROS   GI/Hepatic Neg liver ROS, GERD  ,  Endo/Other  negative endocrine ROSneg diabetes  Renal/GU negative Renal ROS     Musculoskeletal negative musculoskeletal ROS (+)   Abdominal (+) + obese,   Peds  Hematology negative hematology ROS (+)   Anesthesia Other Findings Past Medical History: No date: Allergy No date: Atrial fibrillation (HCC) No date: Cancer [facility])     Comment:  basal cell No date: Cardiomyopathy, nonischemic (HCC) No date: Carpal tunnel syndrome     Comment:  bilateral No date: Congestive heart failure (HCC)     Comment:  normalization of EF with medical therapy 08/31/2015: Dysrhythmia     Comment:  long RP tachycardia No date:  Environmental and seasonal allergies No date: Female cystocele No date: Hyperlipidemia No date: Hypertension No date: Hyperthyroidism No date: Migraines 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 No date: Personal history of colonic polyps No date: PONV (postoperative nausea and vomiting) No date: SUI (stress urinary incontinence, female) No date: Uterovaginal prolapse No date: UTI (urinary tract infection) No date: Vaginal atrophy   Reproductive/Obstetrics                             Anesthesia Physical Anesthesia Plan  ASA: III  Anesthesia Plan: General   Post-op Pain Management:  Regional for Post-op pain   Induction: Intravenous  PONV Risk Score and Plan: 3 and Ondansetron, Dexamethasone and Midazolam  Airway Management Planned: Oral ETT  Additional Equipment:   Intra-op Plan:   Post-operative Plan: Extubation in OR  Informed Consent: I have reviewed the patients History and Physical, chart, labs and discussed the procedure including the risks, benefits and alternatives for the proposed anesthesia with the patient or authorized representative who has indicated his/her understanding and acceptance.     Dental advisory given  Plan Discussed with: CRNA and Anesthesiologist  Anesthesia Plan Comments:         Anesthesia Quick Evaluation

## 2019-06-17 NOTE — Anesthesia Post-op Follow-up Note (Signed)
Anesthesia QCDR form completed.        

## 2019-06-17 NOTE — Op Note (Signed)
06/17/2019  1:01 PM  Patient:   Kristi Greer  Pre-Op Diagnosis:   Massive irreparable rotator cuff tear with early cuff arthropathy, right shoulder.  Post-Op Diagnosis:   Same  Procedure:   Reverse right total shoulder arthroplasty.  Surgeon:   Pascal Lux, MD  Assistant:   Cameron Proud, PA-C; Junie Panning Mecum, PA-S  Anesthesia:   General endotracheal with an interscalene block using Exparel placed preoperatively by the anesthesiologist.  Findings:   As above.  Complications:   None  EBL:    200 cc  Fluids:   1100 cc crystalloid  UOP:   None  TT:   None  Drains:   None  Closure:   Staples  Implants:   All press-fit [employer] Comprehensive system with a #13 micro-humeral stem, a 40 mm humeral tray with a standard insert, and a mini-base plate with a 36 mm glenosphere.  Brief Clinical Note:   The patient is a 68 year old female with a long history of gradually worsening right shoulder pain her symptoms have progressed despite medications, activity modification, etc. Her history and examination are consistent with a chronic large rotator cuff tear. Preoperative MRI scan demonstrated a large rotator cuff tear with early degenerative joint disease.  The scan also demonstrated at least moderate atrophy of both the supraspinatus and infraspinatus muscles, suggesting that the tear would have a very low likelihood of successful repair. Therefore, the patient presents at this time for a reverse right total shoulder arthroplasty.  Procedure:   The patient underwent placement of an interscalene block using Exparel by the anesthesiologist in the preoperative holding area before being brought into the operating room and lain in the supine position. The patient then underwent general endotracheal intubation and anesthesia before the patient was repositioned in the beach chair position using the beach chair positioner. The right shoulder and upper extremity were prepped with ChloraPrep  solution before being draped sterilely. Preoperative antibiotics were administered. A standard anterior approach to the shoulder was made through an approximately 4-5 inch incision. The incision was carried down through the subcutaneous tissues to expose the deltopectoral fascia. The interval between the deltoid and pectoralis muscles was identified and this plane developed, retracting the cephalic vein laterally with the deltoid muscle. The conjoined tendon was identified. Its lateral margin was dissected and the Kolbel self-retraining retractor inserted. The "three sisters" were identified and cauterized. Bursal tissues were removed to improve visualization. The subscapularis tendon was released from its attachment to the lesser tuberosity 1 cm proximal to its insertion and several tagging sutures placed. The inferior capsule was released with care after identifying and protecting the axillary nerve. The proximal humeral cut was made at approximately 25 of retroversion using the extra-medullary guide.   Attention was redirected to the glenoid. The labrum was debrided circumferentially before the center of the glenoid was marked with electrocautery. The guidewire was drilled into the glenoid neck using the appropriate guide. After verifying its position, it was overreamed with the mini-baseplate reamer to create a flat surface. The permanent mini-baseplate was impacted into place. It was stabilized with a 25 x 6.5 mm central screw and four peripheral screws. Locking screws were placed superiorly and inferiorly while nonlocking screws were placed anteriorly and posteriorly. The permanent 36 mm glenosphere was then impacted into place and its Morse taper locking mechanism verified using manual distraction.  Attention was directed to the humeral side. The humeral canal was reamed sequentially beginning with the end-cutting reamer then progressing from a 4  mm reamer up to a 13 mm reamer. This provided excellent  circumferential chatter. The canal was broached beginning with a #10 broach and progressing to a #13 broach. This was left in place and a trial reduction performed using the standard trial humeral platform. The arm demonstrated excellent range of motion as the hand could be brought across the chest to the opposite shoulder and brought to the top of the patient's head and to the patient's ear. The shoulder appeared stable throughout this range of motion. The joint was dislocated and the trial components removed. The permanent #13 micro-stem was impacted into place with care taken to maintain the appropriate version. The permanent 40 mm humeral platform with the standard insert was put together on the back table and impacted into place. Again, the [facility] taper locking mechanism was verified using manual distraction. The shoulder was relocated using two finger pressure and again placed through a range of motion with the findings as described above.  The wound was copiously irrigated with sterile saline solution using the jet lavage system before a total of 30 cc of 0.5% Sensorcaine with epinephrine was injected into the pericapsular and peri-incisional tissues to help with postoperative analgesia. The subscapularis tendon was reapproximated using #2 FiberWire interrupted sutures. The deltopectoral interval was closed using #0 Vicryl interrupted sutures before the subcutaneous tissues were closed using 2-0 Vicryl interrupted sutures. The skin was closed using staples. Prior to closing the skin, 1 g of transexemic acid in 10 cc of normal saline was injected intra-articularly to help with postoperative bleeding. A sterile occlusive dressing was applied to the wound before the arm was placed into a shoulder immobilizer with an abduction pillow. A Polar Care system also was applied to the shoulder. The patient was then transferred back to a hospital bed before being awakened, extubated, and returned to the recovery room in  satisfactory condition after tolerating the procedure well.

## 2019-06-18 ENCOUNTER — Encounter: Payer: Self-pay | Admitting: Surgery

## 2019-06-18 LAB — BASIC METABOLIC PANEL
Anion gap: 8 (ref 5–15)
BUN: 27 mg/dL — ABNORMAL HIGH (ref 8–23)
CO2: 27 mmol/L (ref 22–32)
Calcium: 9.4 mg/dL (ref 8.9–10.3)
Chloride: 106 mmol/L (ref 98–111)
Creatinine, Ser: 1.01 mg/dL — ABNORMAL HIGH (ref 0.44–1.00)
GFR calc Af Amer: >60 mL/min (ref >60)
GFR calc non Af Amer: 58 mL/min — ABNORMAL LOW (ref >60)
Glucose, Bld: 122 mg/dL — ABNORMAL HIGH (ref 70–99)
Potassium: 5.5 mmol/L — ABNORMAL HIGH (ref 3.5–5.1)
Sodium: 141 mmol/L (ref 135–145)

## 2019-06-18 LAB — CBC WITH DIFFERENTIAL/PLATELET
Abs Immature Granulocytes: 0.12 10*3/uL — ABNORMAL HIGH (ref 0.00–0.07)
Basophils Absolute: 0 10*3/uL (ref 0.0–0.1)
Basophils Relative: 0 %
Eosinophils Absolute: 0 10*3/uL (ref 0.0–0.5)
Eosinophils Relative: 0 %
HCT: 36.3 % (ref 36.0–46.0)
Hemoglobin: 11.6 g/dL — ABNORMAL LOW (ref 12.0–15.0)
Immature Granulocytes: 1 %
Lymphocytes Relative: 9 %
Lymphs Abs: 1.7 10*3/uL (ref 0.7–4.0)
MCH: 28 pg (ref 26.0–34.0)
MCHC: 32 g/dL (ref 30.0–36.0)
MCV: 87.5 fL (ref 80.0–100.0)
Monocytes Absolute: 1.1 10*3/uL — ABNORMAL HIGH (ref 0.1–1.0)
Monocytes Relative: 6 %
Neutro Abs: 15.6 10*3/uL — ABNORMAL HIGH (ref 1.7–7.7)
Neutrophils Relative %: 84 %
Platelets: 189 10*3/uL (ref 150–400)
RBC: 4.15 MIL/uL (ref 3.87–5.11)
RDW: 13.8 % (ref 11.5–15.5)
WBC: 18.5 10*3/uL — ABNORMAL HIGH (ref 4.0–10.5)
nRBC: 0 % (ref 0.0–0.2)

## 2019-06-18 LAB — SURGICAL PATHOLOGY

## 2019-06-18 MED ORDER — TRAMADOL HCL 50 MG PO TABS: 50.0000 mg | ORAL_TABLET | Freq: Four times a day (QID) | ORAL | 0 refills | Status: DC | PRN

## 2019-06-18 MED ORDER — OXYCODONE HCL 5 MG PO TABS: 5.0000 mg | ORAL_TABLET | ORAL | 0 refills | Status: DC | PRN

## 2019-06-18 MED ORDER — ASPIRIN EC 325 MG PO TBEC: 325.0000 mg | DELAYED_RELEASE_TABLET | Freq: Every day | ORAL | 0 refills | Status: DC

## 2019-06-18 MED ORDER — PATIROMER SORBITEX CALCIUM 8.4 G PO PACK
8.4000 g | PACK | Freq: Once | ORAL | Status: DC
  Filled 2019-06-18: qty 1

## 2019-06-18 NOTE — Progress Notes (Signed)
Physical Therapy Treatment Patient Details Name: Kristi Greer MRN: [id number] DOB: [birth year] Today's Date: 06/18/2019    History of Present Illness 68 y/o female s/p R reverse total shoulder replacement 06/17/19.    PT Comments    Pt agreeable to PT; denies pain in R shoulder. Spouse present for session. Pt notes seeing OT earlier this morning, but would appreciate re education for self and with spouse now present for all functional activities. Education with demonstration provided for all aspects of polar care including don/doff, dressing/bathing and support provided throughout, don/doff sling and use/non use of neck strap in sitting, upper and lower extremity activity/exercise and assist provided from spouse, energy conservation techniques, safety in home regarding ambulation with/without use of cane (to purchase on own), removal of small carpets/mats and with regard to grandchildren, and safety set up at time when pt is alone at home as well as education on revers total shoulder precautions. Pt/spouse questions educated to their satisfaction; feel prepared to discharge home with home health services in place.    Follow Up Recommendations  Follow surgeon's recommendation for DC plan and follow-up therapies;Home health PT     Equipment Recommendations  3in1 (PT)(pt to purchase [facility] on own)    Recommendations for Other Services       Precautions / Restrictions Precautions Precautions: Shoulder Type of Shoulder Precautions: total(Reverse) Shoulder Interventions: Shoulder sling/immobilizer Precaution Booklet Issued: Yes (comment)(OT Shoulder DC instructions) Restrictions Weight Bearing Restrictions: Yes RUE Weight Bearing: Non weight bearing Other Position/Activity Restrictions: R UE NWBing, in sling     Mobility  Bed Mobility Overal bed mobility: Independent             General bed mobility comments: no issues without rails; good timing as well  Transfers Overall  transfer level: Modified independent Equipment used: None             General transfer comment: stands without A; back of LEs against edge of bed for added assist  Ambulation/Gait             General Gait Details: Discussion regarding use of cane/purchase of cane. Energy conservation techniques and safety concerns education regarding small carpets/mats on floors. Spouse will remove especially per pt "I have tripped on them before".    Stairs             Wheelchair Mobility    Modified Rankin (Stroke Patients Only)       Balance Overall balance assessment: Modified Independent;History of Falls(Pt endorses at least 3 falls in the past year. She says she typically ambulates in the community by holding on to another person, rail, etc. No AD in the home.)                                          Cognition Arousal/Alertness: Awake/alert Behavior During Therapy: WFL for tasks assessed/performed Overall Cognitive Status: Within Functional Limits for tasks assessed                                 General Comments: Pt pleasant and agreeable t/o session. Follows multi-step commands consistently      Exercises Other Exercises Other Exercises: Education/demonstration pt/spouse on don/doff CP sh wrap and sh sling with all questions answered regarding technique, dressing and providing support. Other Exercises: Education pt/spouse regarding 3 in 1 uses,  showering/bathing, education conservation techniques and sling comfort (neck) at rest ensuring supported UE Other Exercises: Education regarding hand, elbow range of motion and exercises as well as walking/stand activities for conservative approach strength/endurance Other Exercises: Education on reverse total shoulder precautions    General Comments        Pertinent Vitals/Pain Pain Assessment: No/denies pain    Home Living Family/patient expects to be discharged to:: Private  residence Living Arrangements: Spouse/significant other Available Help at Discharge: Family;Available 24 hours/day Type of Home: House Home Access: Stairs to enter Entrance Stairs-Rails: Right;Left   Home Equipment: None      Prior Function Level of Independence: Independent      Comments: Pt with increasing issues with R shoulder, but able to do all necessary ADLs, etc   PT Goals (current goals can now be found in the care plan section) Acute Rehab PT Goals Patient Stated Goal: go home Progress towards PT goals: Progressing toward goals    Frequency    BID      PT Plan Current plan remains appropriate    Co-evaluation              AM-PAC PT "6 Clicks" Mobility   Outcome Measure  Help needed turning from your back to your side while in a flat bed without using bedrails?: A Little Help needed moving from lying on your back to sitting on the side of a flat bed without using bedrails?: None Help needed moving to and from a bed to a chair (including a wheelchair)?: A Little Help needed standing up from a chair using your arms (e.g., wheelchair or bedside chair)?: A Little Help needed to walk in hospital room?: A Little Help needed climbing 3-5 steps with a railing? : A Little 6 Click Score: 19    End of Session   Activity Tolerance: Patient tolerated treatment well Patient left: in bed;with call bell/phone within reach;with family/visitor present;Other (comment)(polar care in place)   PT Visit Diagnosis: Pain;Difficulty in walking, not elsewhere classified (R26.2);Muscle weakness (generalized) (M62.81) Pain - Right/Left: Right Pain - part of body: Shoulder     Time: [id number] PT Time Calculation (min) (ACUTE ONLY): 45 min  Charges:  $Gait Training: 8-22 mins $Therapeutic Exercise: 8-22 mins $Therapeutic Activity: 8-22 mins                      Larae Grooms, PTA 06/18/2019, 11:42 AM

## 2019-06-18 NOTE — Discharge Summary (Signed)
Physician Discharge Summary  [name] MRN: [id number] DOB/AGE: 02-Aug-1951 68 y.o.  Admit date: 06/17/2019 Discharge date: 06/18/2019  Admission Diagnoses:  NONTRAUMATIC COMPLETE TEAR OF RIGHT ROTATOR CUFF. ROTATOR CUFF TENDINITIS, RIGHT.  Discharge Diagnoses: Patient Active Problem List   Diagnosis Date Noted  . 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 Pain of left heel 05/03/2019  . Immunization counseling 05/03/2019  . Right shoulder pain 12/21/2018  . Leukocytosis 12/21/2018  . Hyperglycemia 12/21/2018  . Hyperthyroidism 06/21/2018  . Pain of right heel 05/04/2018  . Snoring 01/06/2018  . Impingement syndrome of shoulder region 09/17/2017  . GERD (gastroesophageal reflux disease) 06/24/2017  . Bilateral carpal tunnel syndrome 11/29/2016  . Carpal tunnel syndrome 11/08/2016  . Sinusitis 08/13/2016  . Epigastric abdominal pain 05/04/2016  . Left sided chest pain 12/05/2015  . 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[place])   . NSVT (nonsustained ventricular tachycardia) ([place])   . Environmental allergies 09/29/2015  . Chronic systolic heart failure ([place]) 09/22/2015  . SVT (supraventricular tachycardia) ([place]) 09/01/2015  . Chronic migraine without aura without status migrainosus, not intractable 06/24/2015  . Health care maintenance 04/13/2015  . History of migraine headaches 11/11/2012  . Hypercholesterolemia 11/11/2012  . History of colonic polyps 11/11/2012  . Migraines 10/02/2012  . 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 SUI (stress urinary incontinence, female) 10/02/2012  . Postop check 10/02/2012  . Cystocele 09/23/2012  . Incomplete emptying of bladder 09/23/2012    Past Medical History:  Diagnosis Date  . Allergy   . Atrial fibrillation ([place])   . Cancer ([place])    basal cell  . Cardiomyopathy, nonischemic ([place])   . Carpal tunnel syndrome    bilateral  . Congestive heart failure ([place])    normalization of EF with  medical therapy  . Dysrhythmia 08/31/2015   long RP tachycardia  . Environmental and seasonal allergies   . Female cystocele   . Hyperlipidemia   . Hypertension   . Hyperthyroidism   . Migraines   . Overactive detrusor   . Personal history of colonic polyps   . PONV (postoperative nausea and vomiting)   . SUI (stress urinary incontinence, female)   . Uterovaginal prolapse   . UTI (urinary tract infection)   . Vaginal atrophy      Transfusion: None.   Consultants (if any):   Discharged Condition: Improved  Hospital Course: Kristi Greer is an 68 y.o. 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    Surgeries: Procedure(s): REVERSE SHOULDER ARTHROPLASTY on 06/17/2019 Patient tolerated the surgery well. Taken to PACU where she was stabilized and then transferred to the orthopedic floor.  Started on Lovenox 40mg  q 24 hrs. Foot pumps applied bilaterally at 80 mm. Heels elevated on bed with rolled towels. No evidence of DVT. Negative [place]. Physical therapy started on day #1 for gait training and transfer. OT started day #1 for ADL and assisted devices.  Patient's IV was removed on POD1.  Implants:  All press-fit [employer] Comprehensive system with a #13 micro-humeral stem, a 40 mm humeral tray with a standard insert, and a mini-base plate with a 36 mm glenosphere.  She was given perioperative antibiotics:  Anti-infectives (From admission, onward)   Start     Dose/Rate Route Frequency Ordered Stop   06/17/19 1700  ceFAZolin (ANCEF) IVPB  2g/100 mL premix     2 g 200 mL/hr over 30 Minutes Intravenous Every 6 hours 06/17/19 1436 06/18/19 0133   06/17/19 0831  ceFAZolin (ANCEF) 2-4 GM/100ML-% IVPB    Note to Pharmacy: Register, Karen   : cabinet override      06/17/19 0831 06/17/19 1056    06/17/19 0415  ceFAZolin (ANCEF) IVPB 2g/100 mL premix     2 g 200 mL/hr over 30 Minutes Intravenous  Once 06/17/19 0404 06/17/19 1108    .  She was given sequential compression devices, early ambulation, and Lovenox for DVT prophylaxis.  She benefited maximally from the hospital stay and there were no complications.    Recent vital signs:  Vitals:   06/18/19 0051 06/18/19 0450  BP: 112/76 (!) 109/45  Pulse: 66 (!) 59  Resp:  18  Temp: (!) 97.4 F (36.3 C) 98.2 F (36.8 C)  SpO2: 97% 98%   Recent laboratory studies:  Lab Results  Component Value Date   HGB 11.6 (L) 06/18/2019   HGB 13.3 06/13/2019   HGB 13.3 12/16/2018   Lab Results  Component Value Date   WBC 18.5 (H) 06/18/2019   PLT 189 06/18/2019   Lab Results  Component Value Date   INR 0.98 11/26/2015   Lab Results  Component Value Date   NA 141 06/18/2019   K 5.5 (H) 06/18/2019   CL 106 06/18/2019   CO2 27 06/18/2019   BUN 27 (H) 06/18/2019   CREATININE 1.01 (H) 06/18/2019   GLUCOSE 122 (H) 06/18/2019    Discharge Medications:   Allergies as of 06/18/2019   No Known Allergies     Medication List    TAKE these medications   acetaminophen 500 MG tablet Commonly known as: TYLENOL Take 500-1,000 mg by mouth every 6 (six) hours as needed for moderate pain or headache.   ARTIFICIAL TEAR SOLUTION OP Place 1 drop into both eyes daily as needed (dry eyes).   aspirin EC 325 MG tablet Take 1 tablet (325 mg total) by mouth daily.   calcium carbonate 1500 (600 Ca) MG Tabs tablet Commonly known as: OSCAL Take 1,500 mg by mouth daily.   calcium carbonate 500 MG chewable tablet Commonly known as: TUMS - dosed in mg elemental calcium Chew 1 tablet by mouth daily as needed for indigestion or heartburn.   carvedilol 12.5 MG tablet Commonly known as: COREG Take 1 tablet (12.5 mg total) by mouth 2 (two) times daily with a meal.   Cholecalciferol 25 MCG (1000 UT) capsule Take 1,000 Units by mouth  daily.   diphenhydramine-acetaminophen 25-500 MG Tabs tablet Commonly known as: TYLENOL PM Take 1 tablet by mouth at bedtime as needed (sleep).   fexofenadine 180 MG tablet Commonly known as: ALLEGRA Take 180 mg by mouth at bedtime.   furosemide 20 MG tablet Commonly known as: LASIX TAKE 1 TABLET (20 MG TOTAL) BY MOUTH EVERY OTHER DAY.   lisinopril 5 MG tablet Commonly known as: ZESTRIL Take 1 tablet (5 mg total) by mouth at bedtime. Reported on 11/11/2015 What changed: additional instructions   meloxicam 15 MG tablet Commonly known as: MOBIC Take 1 tablet (15 mg total) by mouth daily.   methimazole 10 MG tablet Commonly known as: TAPAZOLE Take 5 mg by mouth daily.   Nasacort Allergy 24HR 55 MCG/ACT Aero nasal inhaler Generic drug: triamcinolone Place 1 spray into the nose daily as needed (allergies).   oxyCODONE 5 MG immediate release tablet Commonly known as: Oxy IR/ROXICODONE  Take 1-2 tablets (5-10 mg total) by mouth every 4 (four) hours as needed for moderate pain.   pantoprazole 40 MG tablet Commonly known as: PROTONIX Take 40 mg by mouth daily.   simvastatin 10 MG tablet Commonly known as: ZOCOR TAKE 1 TABLET BY MOUTH DAILY AT 6 PM. What changed:   how much to take  how to take this  when to take this  additional instructions   spironolactone 25 MG tablet Commonly known as: ALDACTONE Take 0.5 tablets (12.5 mg total) by mouth daily.   SUMAtriptan 100 MG tablet Commonly known as: IMITREX TAKE 1 TABLET BY MOUTH EVERY DAY AS NEEDED FOR MIGRAINE. MAY REPEAT IN 2 HOURS IF HEADACHE What changed: See the new instructions.   traMADol 50 MG tablet Commonly known as: ULTRAM Take 1 tablet (50 mg total) by mouth every 6 (six) hours as needed for moderate pain.   zonisamide 100 MG capsule Commonly known as: ZONEGRAN TAKE 1 CAPSULE BY MOUTH EVERY DAY What changed:   how much to take  when to take this      Diagnostic Studies: Dg Shoulder Right  Port  Result Date: 06/17/2019 CLINICAL DATA:  Status post reversed total shoulder replacement. EXAM: PORTABLE RIGHT SHOULDER COMPARISON:  May 14, 2014. FINDINGS: The right glenoid and humeral components appear to be well situated. No dislocation is noted. Expected postoperative changes are seen in the surrounding soft tissues. IMPRESSION: Status post right shoulder arthroplasty. Electronically Signed   By: Marijo Conception M.D.   On: 06/17/2019 13:44   [place] Or Nerve Block-image Only ([facility])  Result Date: 06/17/2019 There is no interpretation for this exam.  This order is for images obtained during a surgical procedure.  Please See "Surgeries" Tab for more information regarding the procedure.   Disposition:  Plan for d/c home this afternoon pending progress with PT today.  Follow-up Information    Lattie Corns, PA-C Follow up in 14 day(s).   Specialty: Physician Assistant Why: Electa Sniff information: [place] [address] [phone]          Signed: Judson Roch PA-C 06/18/2019, 7:49 AM

## 2019-06-18 NOTE — Progress Notes (Signed)
Patient is discharging from unit to home today. Husband to provided transportation. Right arm sling in use along with polar care.  Patient demonstrates no s/sx of distress.  All meds and follow up appts reviewed with patient.  All personal belongings with patient

## 2019-06-18 NOTE — Discharge Instructions (Signed)
Diet: As you were doing prior to hospitalization  ° °Shower:  May shower but keep the wounds dry, use an occlusive plastic wrap, NO SOAKING IN TUB.  If the bandage gets wet, change with a clean dry gauze. ° °Dressing:  You may change your dressing as needed. Change the dressing with sterile gauze dressing.   ° °Activity:  Increase activity slowly as tolerated, but follow the weight bearing instructions below.  No lifting or driving for 6 weeks. ° °Weight Bearing:   Non-weightbearing to the right arm. ° °Blood Clot Prevention: Take 1 325 mg aspirin daily for 14 days. ° °To prevent constipation: you may use a stool softener such as - ° °Colace (over the counter) 100 mg by mouth twice a day  °Drink plenty of fluids (prune juice may be helpful) and high fiber foods °Miralax (over the counter) for constipation as needed.   ° °Itching:  If you experience itching with your medications, try taking only a single pain pill, or even half a pain pill at a time.  You may take up to 10 pain pills per day, and you can also use benadryl over the counter for itching or also to help with sleep.  ° °Precautions:  If you experience chest pain or shortness of breath - call 911 immediately for transfer to the hospital emergency department!! ° °If you develop a fever greater that 101 F, purulent drainage from wound, increased redness or drainage from wound, or calf pain-Call [facility]                                              °Follow- Up Appointment:  Please call for an appointment to be seen in 2 weeks at [facility] °

## 2019-06-18 NOTE — Evaluation (Signed)
Occupational Therapy Evaluation Patient Details Name: Kristi Greer MRN: [id number] DOB: [birth year] Today's Date: 06/18/2019    History of Present Illness 68 y/o female s/p R reverse total shoulder replacement 06/17/19.   Clinical Impression   Kristi Greer was seen for an OT evaluation this date. Pt lives with her husband in a 1 level home with 5 steps to enter. Prior to surgery, pt was active and independent. However, she endorses several falls in the past year and states she typically holds onto another person or rail if possible when ambulating in the community. Pt has orders for RUE to be immobilized and will be NWBing per MD. Patient presents with impaired strength/ROM, and sensation to RUE with block not completely resolved yet. These impairments result in a decreased ability to perform self care tasks requiring mod assist for UB dressing and bathing and max assist for application of polar care, compression stockings, and sling/immobilizer. Pt instructed in polar care mgt, compression stockings mgt, sling/immobilizer mgt, ROM exercises for RUE (with instructions for no shoulder exercises until full sensation has returned), RUE precautions, adaptive strategies for bathing/dressing/toileting/grooming, positioning and considerations for sleep, and home/routines modifications to maximize falls prevention, safety, and independence. Handout provided. OT adjusted sling/immobilizer and polar care to improve comfort, optimize positioning, and to maximize skin integrity/safety. Pt verbalized understanding of all education/training provided. Pt will benefit from skilled OT services to address these limitations and improve independence in daily tasks. Recommend HHOT services to continue therapy to maximize return to PLOF, address home/routines modifications and safety, minimize falls risk, and minimize caregiver burden.       Follow Up Recommendations  Home health OT    Equipment Recommendations  3 in 1 bedside commode    Recommendations for Other Services       Precautions / Restrictions Precautions Precautions: Shoulder Type of Shoulder Precautions: total Shoulder Interventions: Shoulder sling/immobilizer Precaution Booklet Issued: Yes (comment)(OT Shoulder DC instructions) Restrictions Weight Bearing Restrictions: Yes RUE Weight Bearing: Non weight bearing Other Position/Activity Restrictions: R UE NWBing, in sling       Mobility Bed Mobility Overal bed mobility: Independent             General bed mobility comments: Pt was able to get to EOB w/o direct assist  Transfers Overall transfer level: Modified independent Equipment used: None             General transfer comment: Pt able to rise to standing and maintain balance w/o issue    Balance Overall balance assessment: Modified Independent;History of Falls(Pt endorses at least 3 falls in the past year. She says she typically ambulates in the community by holding on to another person, rail, etc. No AD in the home.)                                         ADL either performed or assessed with clinical judgement   ADL                                         General ADL Comments: Pt requires moderate assist for UB dressing this date with consistent cues to maintain NWB status through RUE. Max assist for mgt of sling/polar care. She ambulates to the room commode w/o AD and is independent with toileting/peri-care this date.  She is able to don underwear and pants with min asist 2/2 difficulty pushing legs through pants with hospital socks donned. Pt completes STS during dressing tasks safely w/o physical assist this date.     Vision Baseline Vision/History: Wears glasses Wears Glasses: Reading only Patient Visual Report: No change from baseline       Perception     Praxis      Pertinent Vitals/Pain Pain Assessment: No/denies pain(Block still in place)     Hand  Dominance Right   Extremity/Trunk Assessment Upper Extremity Assessment Upper Extremity Assessment: RUE deficits/detail RUE Deficits / Details: RUE NWB and in sling/immobilizer this date. Pt reports continued numbness through dorsal wrist/hand. Can move fingers/wrist in sling. Pt is RUE dominant. RUE: Unable to fully assess due to immobilization RUE Coordination: decreased fine motor;decreased gross motor   Lower Extremity Assessment Lower Extremity Assessment: Defer to PT evaluation;Overall [facility] for tasks assessed   Cervical / Trunk Assessment Cervical / Trunk Assessment: Normal   Communication Communication Communication: No difficulties   Cognition Arousal/Alertness: Awake/alert Behavior During Therapy: WFL for tasks assessed/performed Overall Cognitive Status: Within Functional Limits for tasks assessed                                 General Comments: Pt pleasant and agreeable t/o session. Follows multi-step commands consistently   General Comments       Exercises Other Exercises Other Exercises: Pt educated in falls prevention strategies, sling/polar care mgt, compensatory dressing strategies, shoulder precautions, and considerations for IADL mgt this date. Pt return verbalizes understanding of education provided, but would benefit from review with caregiver present to ensure recall and carryover of information.   Shoulder Instructions      Home Living Family/patient expects to be discharged to:: Private residence Living Arrangements: Spouse/significant other Available Help at Discharge: Family;Available 24 hours/day Type of Home: House Home Access: Stairs to enter [employer] of Steps: 5 Entrance Stairs-Rails: Right;Left       Bathroom Shower/Tub: Tub/shower unit;Curtain         Home Equipment: None          Prior Functioning/Environment Level of Independence: Independent        Comments: Pt with increasing issues with R  shoulder, but able to do all necessary ADLs, etc        OT Problem List: Decreased strength;Decreased coordination;Decreased range of motion;Decreased activity tolerance;Decreased safety awareness;Impaired balance (sitting and/or standing);Decreased knowledge of use of DME or AE;Impaired UE functional use;Decreased knowledge of precautions      OT Treatment/Interventions: Self-care/ADL training;Balance training;Therapeutic exercise;Therapeutic activities;DME and/or AE instruction;Patient/family education    OT Goals(Current goals can be found in the care plan section) Acute Rehab OT Goals Patient Stated Goal: go home OT Goal Formulation: With patient Time For Goal Achievement: 07/02/19 Potential to Achieve Goals: Good ADL Goals Pt Will Perform Grooming: with set-up;with supervision;sitting(With LRAD PRN for improved safety and functional independence.) Pt Will Perform Upper Body Bathing: sitting;with min assist;with min guard assist(With LRAD PRN for improved safety and functional independence.) Pt Will Perform Upper Body Dressing: with min assist;sitting;with caregiver independent in assisting(With LRAD PRN for improved safety and functional independence.)  OT Frequency: Min 1X/week   Barriers to D/C: Inaccessible home environment  Pt with 5 steps to enter the home.       Co-evaluation              AM-PAC OT "6 Clicks"  Daily Activity     Outcome Measure Help from another person eating meals?: A Little Help from another person taking care of personal grooming?: A Little Help from another person toileting, which includes using toliet, bedpan, or urinal?: A Little Help from another person bathing (including washing, rinsing, drying)?: A Little Help from another person to put on and taking off regular upper body clothing?: A Lot Help from another person to put on and taking off regular lower body clothing?: A Little 6 Click Score: 17   End of Session Equipment Utilized During  Treatment: Gait belt  Activity Tolerance: Patient tolerated treatment well Patient left: in bed;with call bell/phone within reach;with nursing/sitter in room(With RN in room to administer AM medications.)  OT Visit Diagnosis: Other abnormalities of gait and mobility (R26.89);History of falling (Z91.81)                Time: [id number] OT Time Calculation (min): 44 min Charges:  OT General Charges $OT Visit: 1 Visit OT Evaluation $OT Eval Low Complexity: 1 Low OT Treatments $Self Care/Home Management : 23-37 mins  Shara Blazing, M.S., OTR/L Ascom: [phone] 06/18/19, 9:37 AM

## 2019-06-18 NOTE — Progress Notes (Signed)
  Subjective: 1 Day Post-Op Procedure(s) (LRB): REVERSE SHOULDER ARTHROPLASTY (Right) Patient reports pain as 0 on 0-10 scale.   Patient is well, and has had no acute complaints or problems Plan is to go Home after hospital stay. Negative for chest pain and shortness of breath Fever: no Gastrointestinal:Negative for nausea and vomiting Exparel block still in affect to the right upper extremity.  Objective: Vital signs in last 24 hours: Temp:  [96.6 F (35.9 C)-98.5 F (36.9 C)] 98.2 F (36.8 C) (10/21 0450) Pulse Rate:  [59-79] 59 (10/21 0450) Resp:  [10-18] 18 (10/21 0450) BP: (97-130)/(45-77) 109/45 (10/21 0450) SpO2:  [10 %-100 %] 98 % (10/21 0450) Weight:  [72.8 kg] 72.8 kg (10/20 0848)  Intake/Output from previous day:  Intake/Output Summary (Last 24 hours) at 06/18/2019 0741 Last data filed at 06/18/2019 0300 Gross per 24 hour  Intake 2712.51 ml  Output 200 ml  Net 2512.51 ml    Intake/Output this shift: No intake/output data recorded.  Labs: Recent Labs    06/18/19 0513  HGB 11.6*   Recent Labs    06/18/19 0513  WBC 18.5*  RBC 4.15  HCT 36.3  PLT 189   Recent Labs    06/18/19 0513  NA 141  K 5.5*  CL 106  CO2 27  BUN 27*  CREATININE 1.01*  GLUCOSE 122*  CALCIUM 9.4   No results for input(s): LABPT, INR in the last 72 hours.   EXAM General - Patient is Alert, Appropriate and Oriented Extremity - ABD soft Incision: dressing C/D/I No cellulitis present  Patient with decreased sensation to light touch to the right arm this AM. Dressing/Incision - clean, dry, no drainage Motor Function - intact, moving foot and toes well on exam. Able to flex and extend fingers this morning, able to resist with thumb extension.  Past Medical History:  Diagnosis Date  . Allergy   . Atrial fibrillation ([place])   . Cancer ([place])    basal cell  . Cardiomyopathy, nonischemic ([place])   . Carpal tunnel syndrome    bilateral  . Congestive heart failure ([place])    normalization of EF with medical therapy  . Dysrhythmia 08/31/2015   long RP tachycardia  . Environmental and seasonal allergies   . Female cystocele   . Hyperlipidemia   . Hypertension   . Hyperthyroidism   . Migraines   . Overactive detrusor   . Personal history of colonic polyps   . PONV (postoperative nausea and vomiting)   . SUI (stress urinary incontinence, female)   . Uterovaginal prolapse   . UTI (urinary tract infection)   . Vaginal atrophy     Assessment/Plan: 1 Day Post-Op Procedure(s) (LRB): REVERSE SHOULDER ARTHROPLASTY (Right) Active Problems:   Status post reverse total shoulder replacement, right  Estimated body mass index is 30.33 kg/m as calculated from the following:   Height as of this encounter: 5\' 1"  (1.549 m).   Weight as of this encounter: 72.8 kg. Advance diet Up with therapy D/C IV fluids when tolerating po intake.  Labs reviewed this AM. Block still working from surgery. Patient is passing gas without pain. Plan for d/c home this afternoon with HHPT.  DVT Prophylaxis - Lovenox and Foot Pumps Non-weightbearing to the right upper extremity.  Raquel Meshulem [place], PA-C [facility]opa[facility] 06/18/2019, 7:41 AM

## 2019-06-20 DIAGNOSIS — Z85828 Personal history of other malignant neoplasm of skin: Secondary | ICD-10-CM | POA: Diagnosis not present

## 2019-06-20 DIAGNOSIS — E0590 Thyrotoxicosis, unspecified without thyrotoxic crisis or storm: Secondary | ICD-10-CM | POA: Diagnosis not present

## 2019-06-20 DIAGNOSIS — I110 Hypertensive heart disease with heart failure: Secondary | ICD-10-CM | POA: Diagnosis not present

## 2019-06-20 DIAGNOSIS — K219 Gastro-esophageal reflux disease without esophagitis: Secondary | ICD-10-CM | POA: Diagnosis not present

## 2019-06-20 DIAGNOSIS — I5022 Chronic systolic (congestive) heart failure: Secondary | ICD-10-CM | POA: Diagnosis not present

## 2019-06-20 DIAGNOSIS — I4891 Unspecified atrial fibrillation: Secondary | ICD-10-CM | POA: Diagnosis not present

## 2019-06-20 DIAGNOSIS — Z86010 Personal history of colonic polyps: Secondary | ICD-10-CM | POA: Diagnosis not present

## 2019-06-20 DIAGNOSIS — E785 Hyperlipidemia, unspecified: Secondary | ICD-10-CM | POA: Diagnosis not present

## 2019-06-20 DIAGNOSIS — Z471 Aftercare following joint replacement surgery: Secondary | ICD-10-CM | POA: Diagnosis not present

## 2019-06-20 DIAGNOSIS — Z96611 Presence of right artificial shoulder joint: Secondary | ICD-10-CM | POA: Diagnosis not present

## 2019-06-24 ENCOUNTER — Encounter: Payer: Self-pay | Admitting: Internal Medicine

## 2019-06-24 NOTE — Telephone Encounter (Signed)
Called patient to let her know that it is normal for her to have nausea and constipation with the pain meds. She only has the light headedness when she tries to force herself to get up and move around instead of sleeping. Dr Poggi's office sent in zofran for her and advised her to a stool softener if needed. She sees them for f/u on Friday. Patient is going to continue to monitor and let me know if any problems. Advised to try to eat bland foods and push fluids.

## 2019-07-02 DIAGNOSIS — M6281 Muscle weakness (generalized): Secondary | ICD-10-CM | POA: Diagnosis not present

## 2019-07-02 DIAGNOSIS — M25511 Pain in right shoulder: Secondary | ICD-10-CM | POA: Diagnosis not present

## 2019-07-02 DIAGNOSIS — Z96611 Presence of right artificial shoulder joint: Secondary | ICD-10-CM | POA: Diagnosis not present

## 2019-07-02 DIAGNOSIS — G8929 Other chronic pain: Secondary | ICD-10-CM | POA: Diagnosis not present

## 2019-07-02 DIAGNOSIS — M25611 Stiffness of right shoulder, not elsewhere classified: Secondary | ICD-10-CM | POA: Diagnosis not present

## 2019-07-05 ENCOUNTER — Other Ambulatory Visit: Payer: Self-pay | Admitting: Internal Medicine

## 2019-07-08 NOTE — Telephone Encounter (Signed)
rx ok'd for zonegran

## 2019-07-09 DIAGNOSIS — Z96611 Presence of right artificial shoulder joint: Secondary | ICD-10-CM | POA: Diagnosis not present

## 2019-07-09 DIAGNOSIS — M25511 Pain in right shoulder: Secondary | ICD-10-CM | POA: Diagnosis not present

## 2019-07-16 DIAGNOSIS — M25511 Pain in right shoulder: Secondary | ICD-10-CM | POA: Diagnosis not present

## 2019-07-16 DIAGNOSIS — Z96611 Presence of right artificial shoulder joint: Secondary | ICD-10-CM | POA: Diagnosis not present

## 2019-07-21 DIAGNOSIS — G8929 Other chronic pain: Secondary | ICD-10-CM | POA: Diagnosis not present

## 2019-07-21 DIAGNOSIS — M6281 Muscle weakness (generalized): Secondary | ICD-10-CM | POA: Diagnosis not present

## 2019-07-21 DIAGNOSIS — M25611 Stiffness of right shoulder, not elsewhere classified: Secondary | ICD-10-CM | POA: Diagnosis not present

## 2019-07-21 DIAGNOSIS — M25511 Pain in right shoulder: Secondary | ICD-10-CM | POA: Diagnosis not present

## 2019-07-21 DIAGNOSIS — Z96611 Presence of right artificial shoulder joint: Secondary | ICD-10-CM | POA: Diagnosis not present

## 2019-08-01 DIAGNOSIS — M25611 Stiffness of right shoulder, not elsewhere classified: Secondary | ICD-10-CM | POA: Diagnosis not present

## 2019-08-01 DIAGNOSIS — M7582 Other shoulder lesions, left shoulder: Secondary | ICD-10-CM | POA: Diagnosis not present

## 2019-08-01 DIAGNOSIS — Z96611 Presence of right artificial shoulder joint: Secondary | ICD-10-CM | POA: Diagnosis not present

## 2019-08-01 DIAGNOSIS — M6281 Muscle weakness (generalized): Secondary | ICD-10-CM | POA: Diagnosis not present

## 2019-08-01 DIAGNOSIS — G8929 Other chronic pain: Secondary | ICD-10-CM | POA: Diagnosis not present

## 2019-08-01 DIAGNOSIS — M25511 Pain in right shoulder: Secondary | ICD-10-CM | POA: Diagnosis not present

## 2019-08-01 DIAGNOSIS — M7542 Impingement syndrome of left shoulder: Secondary | ICD-10-CM | POA: Diagnosis not present

## 2019-08-01 DIAGNOSIS — M75121 Complete rotator cuff tear or rupture of right shoulder, not specified as traumatic: Secondary | ICD-10-CM | POA: Diagnosis not present

## 2019-08-05 DIAGNOSIS — M25611 Stiffness of right shoulder, not elsewhere classified: Secondary | ICD-10-CM | POA: Diagnosis not present

## 2019-08-05 DIAGNOSIS — G8929 Other chronic pain: Secondary | ICD-10-CM | POA: Diagnosis not present

## 2019-08-05 DIAGNOSIS — M25511 Pain in right shoulder: Secondary | ICD-10-CM | POA: Diagnosis not present

## 2019-08-05 DIAGNOSIS — Z96611 Presence of right artificial shoulder joint: Secondary | ICD-10-CM | POA: Diagnosis not present

## 2019-08-05 DIAGNOSIS — M6281 Muscle weakness (generalized): Secondary | ICD-10-CM | POA: Diagnosis not present

## 2019-08-08 DIAGNOSIS — M6281 Muscle weakness (generalized): Secondary | ICD-10-CM | POA: Diagnosis not present

## 2019-08-08 DIAGNOSIS — M25511 Pain in right shoulder: Secondary | ICD-10-CM | POA: Diagnosis not present

## 2019-08-08 DIAGNOSIS — Z96611 Presence of right artificial shoulder joint: Secondary | ICD-10-CM | POA: Diagnosis not present

## 2019-08-08 DIAGNOSIS — M25611 Stiffness of right shoulder, not elsewhere classified: Secondary | ICD-10-CM | POA: Diagnosis not present

## 2019-08-08 DIAGNOSIS — G8929 Other chronic pain: Secondary | ICD-10-CM | POA: Diagnosis not present

## 2019-08-13 DIAGNOSIS — Z96611 Presence of right artificial shoulder joint: Secondary | ICD-10-CM | POA: Diagnosis not present

## 2019-08-13 DIAGNOSIS — M6281 Muscle weakness (generalized): Secondary | ICD-10-CM | POA: Diagnosis not present

## 2019-08-13 DIAGNOSIS — G8929 Other chronic pain: Secondary | ICD-10-CM | POA: Diagnosis not present

## 2019-08-13 DIAGNOSIS — M25511 Pain in right shoulder: Secondary | ICD-10-CM | POA: Diagnosis not present

## 2019-08-13 DIAGNOSIS — M25611 Stiffness of right shoulder, not elsewhere classified: Secondary | ICD-10-CM | POA: Diagnosis not present

## 2019-08-25 DIAGNOSIS — Z96611 Presence of right artificial shoulder joint: Secondary | ICD-10-CM | POA: Diagnosis not present

## 2019-08-25 DIAGNOSIS — M25511 Pain in right shoulder: Secondary | ICD-10-CM | POA: Diagnosis not present

## 2019-08-25 DIAGNOSIS — G8929 Other chronic pain: Secondary | ICD-10-CM | POA: Diagnosis not present

## 2019-08-25 DIAGNOSIS — M6281 Muscle weakness (generalized): Secondary | ICD-10-CM | POA: Diagnosis not present

## 2019-08-25 DIAGNOSIS — M25611 Stiffness of right shoulder, not elsewhere classified: Secondary | ICD-10-CM | POA: Diagnosis not present

## 2019-08-27 DIAGNOSIS — G8929 Other chronic pain: Secondary | ICD-10-CM | POA: Diagnosis not present

## 2019-08-27 DIAGNOSIS — M25611 Stiffness of right shoulder, not elsewhere classified: Secondary | ICD-10-CM | POA: Diagnosis not present

## 2019-08-27 DIAGNOSIS — M25511 Pain in right shoulder: Secondary | ICD-10-CM | POA: Diagnosis not present

## 2019-08-27 DIAGNOSIS — Z96611 Presence of right artificial shoulder joint: Secondary | ICD-10-CM | POA: Diagnosis not present

## 2019-08-27 DIAGNOSIS — M6281 Muscle weakness (generalized): Secondary | ICD-10-CM | POA: Diagnosis not present

## 2019-08-28 ENCOUNTER — Other Ambulatory Visit (INDEPENDENT_AMBULATORY_CARE_PROVIDER_SITE_OTHER): Payer: Medicare Other

## 2019-08-28 ENCOUNTER — Other Ambulatory Visit: Payer: Self-pay

## 2019-08-28 DIAGNOSIS — E7800 Pure hypercholesterolemia, unspecified: Secondary | ICD-10-CM

## 2019-08-28 DIAGNOSIS — R739 Hyperglycemia, unspecified: Secondary | ICD-10-CM | POA: Diagnosis not present

## 2019-08-28 LAB — HEPATIC FUNCTION PANEL
ALT: 13 U/L (ref 0–35)
AST: 19 U/L (ref 0–37)
Albumin: 3.9 g/dL (ref 3.5–5.2)
Alkaline Phosphatase: 104 U/L (ref 39–117)
Bilirubin, Direct: 0.1 mg/dL (ref 0.0–0.3)
Total Bilirubin: 0.4 mg/dL (ref 0.2–1.2)
Total Protein: 6.2 g/dL (ref 6.0–8.3)

## 2019-08-28 LAB — BASIC METABOLIC PANEL
BUN: 22 mg/dL (ref 6–23)
CO2: 27 mEq/L (ref 19–32)
Calcium: 9.4 mg/dL (ref 8.4–10.5)
Chloride: 105 mEq/L (ref 96–112)
Creatinine, Ser: 0.97 mg/dL (ref 0.40–1.20)
GFR: 57.33 mL/min — ABNORMAL LOW (ref >60.00)
Glucose, Bld: 96 mg/dL (ref 70–99)
Potassium: 4.2 mEq/L (ref 3.5–5.1)
Sodium: 140 mEq/L (ref 135–145)

## 2019-08-28 LAB — LIPID PANEL
Cholesterol: 181 mg/dL (ref 0–200)
HDL: 49 mg/dL (ref >39.00)
LDL Cholesterol: 107 mg/dL — ABNORMAL HIGH (ref 0–99)
NonHDL: 131.55
Total CHOL/HDL Ratio: 4
Triglycerides: 122 mg/dL (ref 0.0–149.0)
VLDL: 24.4 mg/dL (ref 0.0–40.0)

## 2019-08-28 LAB — HEMOGLOBIN A1C: Hgb A1c MFr Bld: 5.9 % (ref 4.6–6.5)

## 2019-09-01 ENCOUNTER — Encounter: Payer: Self-pay | Admitting: Internal Medicine

## 2019-09-01 ENCOUNTER — Ambulatory Visit (INDEPENDENT_AMBULATORY_CARE_PROVIDER_SITE_OTHER): Payer: Medicare Other | Admitting: Internal Medicine

## 2019-09-01 ENCOUNTER — Other Ambulatory Visit: Payer: Self-pay

## 2019-09-01 VITALS — Ht 61.0 in | Wt 160.0 lb

## 2019-09-01 DIAGNOSIS — G43709 Chronic migraine without aura, not intractable, without status migrainosus: Secondary | ICD-10-CM

## 2019-09-01 DIAGNOSIS — K219 Gastro-esophageal reflux disease without esophagitis: Secondary | ICD-10-CM

## 2019-09-01 DIAGNOSIS — M25511 Pain in right shoulder: Secondary | ICD-10-CM

## 2019-09-01 DIAGNOSIS — E7800 Pure hypercholesterolemia, unspecified: Secondary | ICD-10-CM | POA: Diagnosis not present

## 2019-09-01 DIAGNOSIS — I5022 Chronic systolic (congestive) heart failure: Secondary | ICD-10-CM | POA: Diagnosis not present

## 2019-09-01 DIAGNOSIS — R739 Hyperglycemia, unspecified: Secondary | ICD-10-CM

## 2019-09-01 DIAGNOSIS — Z9109 Other allergy status, other than to drugs and biological substances: Secondary | ICD-10-CM

## 2019-09-01 DIAGNOSIS — I472 Ventricular tachycardia: Secondary | ICD-10-CM

## 2019-09-01 DIAGNOSIS — E0590 Thyrotoxicosis, unspecified without thyrotoxic crisis or storm: Secondary | ICD-10-CM

## 2019-09-01 DIAGNOSIS — I420 Dilated cardiomyopathy: Secondary | ICD-10-CM

## 2019-09-01 DIAGNOSIS — I4729 Other ventricular tachycardia: Secondary | ICD-10-CM

## 2019-09-01 MED ORDER — ROSUVASTATIN CALCIUM 10 MG PO TABS: 10.0000 mg | ORAL_TABLET | Freq: Every day | ORAL | 1 refills | Status: DC

## 2019-09-01 NOTE — Progress Notes (Signed)
[name], female   DOB: 12/20/1950, 69 y.o.   MRN: [id number]   Virtual Visit via video Note  This visit type was conducted due to national recommendations for restrictions regarding the COVID-19 pandemic (e.g. social distancing).  This format is felt to be most appropriate for this patient at this time.  All issues noted in this document were discussed and addressed.  No physical exam was performed (except for noted visual exam findings with Video Visits).   I connected with Kristi Greer by a video enabled telemedicine application and verified that I am speaking with the correct person using two identifiers. Location patient: home Location provider: work Persons participating in the virtual visit: patient, provider  The limitations, risks, security and privacy concerns of performing an evaluation and management service by video and the availability of in person appointments have been discussed.  The patient expressed understanding and agreed to proceed.   Reason for visit: scheduled follow up.   HPI: She reports she is doing well.  Feels good.  Is s/p right shoulder surgery.  Going to PT.  Taking tylenol for pain.  No headache.  Discussed possibly coming off zonegran.  No light headedness or dizziness.  No chest pain or sob.  Sees Dr Rockey Situ.  Last evaluated 04/2019. Continue coreg.  No increased heart rate or palpitations.  No abdominal pain or bowel change reported.  Saw podiatry for heel pain.  S/p injection.  Wearing crocs.  Helped.  Wore bedroom shoes this past week.  Started feeling pain again.  Back in crocs.  Helping.  Overall feels good.  Staying active.   Discussed labs.  Discussed changing simvastatin to crestor.     ROS: See pertinent positives and negatives per HPI.  Past Medical History:  Diagnosis Date  . Allergy   . Atrial fibrillation ([place])   . Cancer ([place])    basal cell  . Cardiomyopathy, nonischemic ([place])   . Carpal tunnel syndrome    bilateral  . Congestive heart failure ([place])    normalization of EF with medical therapy  . Dysrhythmia 08/31/2015   long RP tachycardia  . Environmental and seasonal allergies   . Female cystocele   . Hyperlipidemia   . Hypertension   . Hyperthyroidism   . Migraines   . Overactive detrusor   . Personal history of colonic polyps   . PONV (postoperative nausea and vomiting)   . SUI (stress urinary incontinence, female)   . Uterovaginal prolapse   . UTI (urinary tract infection)   . Vaginal atrophy     Past Surgical History:  Procedure Laterality Date  . ANTERIOR AND POSTERIOR VAGINAL REPAIR W/ SACROSPINOUS LIGAMENT SUSPENSION  10/18/2012  . CARDIAC CATHETERIZATION N/A 11/26/2015   Procedure: Right/Left Heart Cath and Coronary Angiography;  Surgeon: Jolaine Artist, MD;  Location: [facility];  Service: Cardiovascular;  Laterality: N/A;  . CHOLECYSTECTOMY  2006  . COLONOSCOPY WITH PROPOFOL N/A 03/19/2017   Procedure: COLONOSCOPY WITH PROPOFOL;  Surgeon: Lollie Sails, MD;  Location: [facility] ENDOSCOPY;  Service: Endoscopy;  Laterality: N/A;  . COLPOPEXY  10/18/2012  . CYSTOURETHROSCOPY  10/18/2012  . ESOPHAGOGASTRODUODENOSCOPY (EGD) WITH PROPOFOL N/A 10/03/2016   Procedure: ESOPHAGOGASTRODUODENOSCOPY (EGD) WITH PROPOFOL;  Surgeon: Lollie Sails, MD;  Location: [facility] ENDOSCOPY;  Service: Endoscopy;  Laterality: N/A;  . EYE SURGERY     cataract extration  . FINGER SURGERY    . HYSTEROSCOPY  2011, 10/2010  . OOPHORECTOMY    .  REVERSE SHOULDER ARTHROPLASTY Right 06/17/2019   Procedure: REVERSE SHOULDER ARTHROPLASTY;  Surgeon: Corky Mull, MD;  Location: [facility] ORS;  Service: Orthopedics;  Laterality: Right;  . TONSILLECTOMY    . TUBAL LIGATION    . VAGINAL HYSTERECTOMY     midurethral sling    Family History  Problem Relation Age of Onset  . Hypertension Mother   . Stroke Mother   . Diabetes Mother   . Hypercholesterolemia Father   . Heart disease Father         MI  . Hypertension Father   . Stroke Father   . Breast cancer Neg Hx   . Colon cancer Neg Hx   . Thyroid disease Neg Hx     SOCIAL HX: reviewed.    Current Outpatient Medications:  .  acetaminophen (TYLENOL) 500 MG tablet, Take 500-1,000 mg by mouth every 6 (six) hours as needed for moderate pain or headache., Disp: , Rfl:  .  ARTIFICIAL TEAR SOLUTION OP, Place 1 drop into both eyes daily as needed (dry eyes)., Disp: , Rfl:  .  aspirin EC 325 MG tablet, Take 1 tablet (325 mg total) by mouth daily., Disp: 30 tablet, Rfl: 0 .  calcium carbonate (OSCAL) 1500 (600 Ca) MG TABS tablet, Take 1,500 mg by mouth daily. , Disp: , Rfl:  .  calcium carbonate (TUMS - DOSED IN MG ELEMENTAL CALCIUM) 500 MG chewable tablet, Chew 1 tablet by mouth daily as needed for indigestion or heartburn., Disp: , Rfl:  .  carvedilol (COREG) 12.5 MG tablet, Take 1 tablet (12.5 mg total) by mouth 2 (two) times daily with a meal., Disp: 180 tablet, Rfl: 2 .  Cholecalciferol 1000 units capsule, Take 1,000 Units by mouth daily., Disp: , Rfl:  .  diphenhydramine-acetaminophen (TYLENOL PM) 25-500 MG TABS tablet, Take 1 tablet by mouth at bedtime as needed (sleep). , Disp: , Rfl:  .  fexofenadine (ALLEGRA) 180 MG tablet, Take 180 mg by mouth at bedtime. , Disp: , Rfl:  .  furosemide (LASIX) 20 MG tablet, TAKE 1 TABLET (20 MG TOTAL) BY MOUTH EVERY OTHER DAY., Disp: 45 tablet, Rfl: 3 .  lisinopril (ZESTRIL) 5 MG tablet, Take 1 tablet (5 mg total) by mouth at bedtime. Reported on 11/11/2015 (Patient taking differently: Take 5 mg by mouth at bedtime. ), Disp: 90 tablet, Rfl: 3 .  methimazole (TAPAZOLE) 10 MG tablet, Take 5 mg by mouth daily., Disp: , Rfl:  .  pantoprazole (PROTONIX) 40 MG tablet, Take 40 mg by mouth daily., Disp: , Rfl:  .  rosuvastatin (CRESTOR) 10 MG tablet, Take 1 tablet (10 mg total) by mouth daily., Disp: 30 tablet, Rfl: 1 .  spironolactone (ALDACTONE) 25 MG tablet, Take 0.5 tablets (12.5 mg total) by mouth  daily., Disp: 45 tablet, Rfl: 3 .  SUMAtriptan (IMITREX) 100 MG tablet, TAKE 1 TABLET BY MOUTH EVERY DAY AS NEEDED FOR MIGRAINE. MAY REPEAT IN 2 HOURS IF HEADACHE (Patient taking differently: Take 100 mg by mouth every 2 (two) hours as needed for migraine. ), Disp: 9 tablet, Rfl: 0 .  triamcinolone (NASACORT ALLERGY 24HR) 55 MCG/ACT AERO nasal inhaler, Place 1 spray into the nose daily as needed (allergies)., Disp: , Rfl:  .  zonisamide (ZONEGRAN) 100 MG capsule, TAKE 1 CAPSULE BY MOUTH EVERY DAY, Disp: 90 capsule, Rfl: 0  EXAM:  GENERAL: alert, oriented, appears well and in no acute distress  HEENT: atraumatic, conjunttiva clear, no obvious abnormalities on inspection of external nose and ears  NECK: normal movements of the head and neck  LUNGS: on inspection no signs of respiratory distress, breathing rate appears normal, no obvious gross SOB, gasping or wheezing  CV: no obvious cyanosis  PSYCH/NEURO: pleasant and cooperative, no obvious depression or anxiety, speech and thought processing grossly intact  ASSESSMENT AND PLAN:  Discussed the following assessment and plan:  Chronic systolic heart failure (HCC) Followed by cardiology.  Doing well on current regimen.  No evidence of volume overload today.  Continue current medication regimen.  Follow.    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HCC) Stable on current medication regimen.  No sob.  Follow.   Environmental allergies Controlled.    GERD (gastroesophageal reflux disease) Controlled on current regimen.    Hypercholesterolemia On simvastatin.  Discussed labs.  Change to crestor.   Low cholesterol diet and exercise.  Follow lipid panel and liver function tests.    Hyperglycemia Low carb diet and exercise.  Follow met b and a1c.    Hyperthyroidism Followed by endocrinology.  Stable.    NSVT (nonsustained ventricular tachycardia) (HCC) On coreg.  No increased heart rate or palpitations.  Stable.    Right shoulder  pain S/p right shoulder surgery.  Going to therapy.    Chronic migraine without aura without status migrainosus, not intractable On zonegran.  No headaches now.  May be able to come off medication.  Follow.     Orders Placed This Encounter  Procedures  . Hepatic function panel    Standing Status:   Future    Standing Expiration Date:   08/31/2020      I discussed the assessment and treatment plan with the patient. The patient was provided an opportunity to ask questions and all were answered. The patient agreed with the plan and demonstrated an understanding of the instructions.   The patient was advised to call back or seek an in-person evaluation if the symptoms worsen or if the condition fails to improve as anticipated.   Einar Pheasant, MD

## 2019-09-02 DIAGNOSIS — M25611 Stiffness of right shoulder, not elsewhere classified: Secondary | ICD-10-CM | POA: Diagnosis not present

## 2019-09-02 DIAGNOSIS — G8929 Other chronic pain: Secondary | ICD-10-CM | POA: Diagnosis not present

## 2019-09-02 DIAGNOSIS — M6281 Muscle weakness (generalized): Secondary | ICD-10-CM | POA: Diagnosis not present

## 2019-09-02 DIAGNOSIS — M25511 Pain in right shoulder: Secondary | ICD-10-CM | POA: Diagnosis not present

## 2019-09-02 DIAGNOSIS — Z96611 Presence of right artificial shoulder joint: Secondary | ICD-10-CM | POA: Diagnosis not present

## 2019-09-04 DIAGNOSIS — Z96611 Presence of right artificial shoulder joint: Secondary | ICD-10-CM | POA: Diagnosis not present

## 2019-09-04 DIAGNOSIS — M25511 Pain in right shoulder: Secondary | ICD-10-CM | POA: Diagnosis not present

## 2019-09-04 DIAGNOSIS — M25611 Stiffness of right shoulder, not elsewhere classified: Secondary | ICD-10-CM | POA: Diagnosis not present

## 2019-09-04 DIAGNOSIS — G8929 Other chronic pain: Secondary | ICD-10-CM | POA: Diagnosis not present

## 2019-09-04 DIAGNOSIS — M6281 Muscle weakness (generalized): Secondary | ICD-10-CM | POA: Diagnosis not present

## 2019-09-06 ENCOUNTER — Encounter: Payer: Self-pay | Admitting: Internal Medicine

## 2019-09-06 NOTE — Assessment & Plan Note (Signed)
Controlled on current regimen.   

## 2019-09-06 NOTE — Assessment & Plan Note (Signed)
Low carb diet and exercise.  Follow met b and a1c.   

## 2019-09-06 NOTE — Assessment & Plan Note (Signed)
Followed by endocrinology.  Stable.

## 2019-09-06 NOTE — Assessment & Plan Note (Signed)
Stable on current medication regimen.  No sob.  Follow.

## 2019-09-06 NOTE — Assessment & Plan Note (Signed)
S/p right shoulder surgery.  Going to therapy.

## 2019-09-06 NOTE — Assessment & Plan Note (Addendum)
On simvastatin.  Discussed labs.  Change to crestor.   Low cholesterol diet and exercise.  Follow lipid panel and liver function tests.

## 2019-09-06 NOTE — Assessment & Plan Note (Signed)
Controlled.  

## 2019-09-06 NOTE — Assessment & Plan Note (Signed)
Followed by cardiology.  Doing well on current regimen.  No evidence of volume overload today.  Continue current medication regimen.  Follow.

## 2019-09-06 NOTE — Assessment & Plan Note (Signed)
On zonegran.  No headaches now.  May be able to come off medication.  Follow.

## 2019-09-06 NOTE — Assessment & Plan Note (Signed)
On coreg.  No increased heart rate or palpitations.  Stable.

## 2019-09-08 DIAGNOSIS — Z96611 Presence of right artificial shoulder joint: Secondary | ICD-10-CM | POA: Diagnosis not present

## 2019-09-08 DIAGNOSIS — M6281 Muscle weakness (generalized): Secondary | ICD-10-CM | POA: Diagnosis not present

## 2019-09-08 DIAGNOSIS — M25611 Stiffness of right shoulder, not elsewhere classified: Secondary | ICD-10-CM | POA: Diagnosis not present

## 2019-09-08 DIAGNOSIS — M25511 Pain in right shoulder: Secondary | ICD-10-CM | POA: Diagnosis not present

## 2019-09-08 DIAGNOSIS — G8929 Other chronic pain: Secondary | ICD-10-CM | POA: Diagnosis not present

## 2019-09-12 DIAGNOSIS — M6281 Muscle weakness (generalized): Secondary | ICD-10-CM | POA: Diagnosis not present

## 2019-09-12 DIAGNOSIS — M25511 Pain in right shoulder: Secondary | ICD-10-CM | POA: Diagnosis not present

## 2019-09-12 DIAGNOSIS — M25611 Stiffness of right shoulder, not elsewhere classified: Secondary | ICD-10-CM | POA: Diagnosis not present

## 2019-09-12 DIAGNOSIS — G8929 Other chronic pain: Secondary | ICD-10-CM | POA: Diagnosis not present

## 2019-09-12 DIAGNOSIS — Z96611 Presence of right artificial shoulder joint: Secondary | ICD-10-CM | POA: Diagnosis not present

## 2019-09-16 DIAGNOSIS — M25611 Stiffness of right shoulder, not elsewhere classified: Secondary | ICD-10-CM | POA: Diagnosis not present

## 2019-09-16 DIAGNOSIS — G8929 Other chronic pain: Secondary | ICD-10-CM | POA: Diagnosis not present

## 2019-09-16 DIAGNOSIS — M25511 Pain in right shoulder: Secondary | ICD-10-CM | POA: Diagnosis not present

## 2019-09-16 DIAGNOSIS — M6281 Muscle weakness (generalized): Secondary | ICD-10-CM | POA: Diagnosis not present

## 2019-09-16 DIAGNOSIS — Z96611 Presence of right artificial shoulder joint: Secondary | ICD-10-CM | POA: Diagnosis not present

## 2019-09-18 DIAGNOSIS — Z96611 Presence of right artificial shoulder joint: Secondary | ICD-10-CM | POA: Diagnosis not present

## 2019-09-18 DIAGNOSIS — M25511 Pain in right shoulder: Secondary | ICD-10-CM | POA: Diagnosis not present

## 2019-09-18 DIAGNOSIS — M25611 Stiffness of right shoulder, not elsewhere classified: Secondary | ICD-10-CM | POA: Diagnosis not present

## 2019-09-18 DIAGNOSIS — G8929 Other chronic pain: Secondary | ICD-10-CM | POA: Diagnosis not present

## 2019-09-18 DIAGNOSIS — M6281 Muscle weakness (generalized): Secondary | ICD-10-CM | POA: Diagnosis not present

## 2019-09-22 DIAGNOSIS — Z96611 Presence of right artificial shoulder joint: Secondary | ICD-10-CM | POA: Diagnosis not present

## 2019-09-22 DIAGNOSIS — G8929 Other chronic pain: Secondary | ICD-10-CM | POA: Diagnosis not present

## 2019-09-22 DIAGNOSIS — M25511 Pain in right shoulder: Secondary | ICD-10-CM | POA: Diagnosis not present

## 2019-09-22 DIAGNOSIS — M25611 Stiffness of right shoulder, not elsewhere classified: Secondary | ICD-10-CM | POA: Diagnosis not present

## 2019-09-22 DIAGNOSIS — M6281 Muscle weakness (generalized): Secondary | ICD-10-CM | POA: Diagnosis not present

## 2019-09-23 ENCOUNTER — Other Ambulatory Visit: Payer: Self-pay

## 2019-09-23 ENCOUNTER — Encounter: Payer: Self-pay | Admitting: Podiatry

## 2019-09-23 ENCOUNTER — Ambulatory Visit (INDEPENDENT_AMBULATORY_CARE_PROVIDER_SITE_OTHER): Payer: Medicare Other | Admitting: Podiatry

## 2019-09-23 DIAGNOSIS — M7731 Calcaneal spur, right foot: Secondary | ICD-10-CM | POA: Diagnosis not present

## 2019-09-23 DIAGNOSIS — M7661 Achilles tendinitis, right leg: Secondary | ICD-10-CM

## 2019-09-23 NOTE — Patient Instructions (Signed)
Pre-Operative Instructions  Congratulations, you have decided to take an important step towards improving your quality of life.  You can be assured that the doctors and staff at [facility] will be with you every step of the way.  Here are some important things you should know:  1. Plan to be at the surgery center/hospital at least 1 (one) hour prior to your scheduled time, unless otherwise directed by the surgical center/hospital staff.  You must have a responsible adult accompany you, remain during the surgery and drive you home.  Make sure you have directions to the surgical center/hospital to ensure you arrive on time. 2. If you are having surgery at [facility] or [facility] hospitals, you will need a copy of your medical history and physical form from your family physician within one month prior to the date of surgery. We will give you a form for your primary physician to complete.  3. We make every effort to accommodate the date you request for surgery.  However, there are times where surgery dates or times have to be moved.  We will contact you as soon as possible if a change in schedule is required.   4. No aspirin/ibuprofen for one week before surgery.  If you are on aspirin, any non-steroidal anti-inflammatory medications (Mobic, Aleve, Ibuprofen) should not be taken seven (7) days prior to your surgery.  You make take Tylenol for pain prior to surgery.  5. Medications - If you are taking daily heart and blood pressure medications, seizure, reflux, allergy, asthma, anxiety, pain or diabetes medications, make sure you notify the surgery center/hospital before the day of surgery so they can tell you which medications you should take or avoid the day of surgery. 6. No food or drink after midnight the night before surgery unless directed otherwise by surgical center/hospital staff. 7. No alcoholic beverages 24-hours prior to surgery.  No smoking 24-hours prior or 24-hours after  surgery. 8. Wear loose pants or shorts. They should be loose enough to fit over bandages, boots, and casts. 9. Don't wear slip-on shoes. Sneakers are preferred. 10. Bring your boot with you to the surgery center/hospital.  Also bring crutches or a walker if your physician has prescribed it for you.  If you do not have this equipment, it will be provided for you after surgery. 11. If you have not been contacted by the surgery center/hospital by the day before your surgery, call to confirm the date and time of your surgery. 12. Leave-time from work may vary depending on the type of surgery you have.  Appropriate arrangements should be made prior to surgery with your employer. 13. Prescriptions will be provided immediately following surgery by your doctor.  Fill these as soon as possible after surgery and take the medication as directed. Pain medications will not be refilled on weekends and must be approved by the doctor. 14. Remove nail polish on the operative foot and avoid getting pedicures prior to surgery. 15. Wash the night before surgery.  The night before surgery wash the foot and leg well with water and the antibacterial soap provided. Be sure to pay special attention to beneath the toenails and in between the toes.  Wash for at least three (3) minutes. Rinse thoroughly with water and dry well with a towel.  Perform this wash unless told not to do so by your physician.  Enclosed: 1 Ice pack (please put in freezer the night before surgery)   1 Hibiclens skin cleaner     Pre-op instructions  If you have any questions regarding the instructions, please do not hesitate to call our office.  [place]: [address], , [place] [address] -- [phone]  [place]: [address] [place], [place] [address] -- [phone]  [place]: [address], [place], [place] [address] -- [phone]   Website: [email or web address] 

## 2019-09-24 ENCOUNTER — Telehealth: Payer: Self-pay | Admitting: Podiatry

## 2019-09-24 DIAGNOSIS — G8929 Other chronic pain: Secondary | ICD-10-CM | POA: Diagnosis not present

## 2019-09-24 DIAGNOSIS — M6281 Muscle weakness (generalized): Secondary | ICD-10-CM | POA: Diagnosis not present

## 2019-09-24 DIAGNOSIS — Z96611 Presence of right artificial shoulder joint: Secondary | ICD-10-CM | POA: Diagnosis not present

## 2019-09-24 DIAGNOSIS — M25611 Stiffness of right shoulder, not elsewhere classified: Secondary | ICD-10-CM | POA: Diagnosis not present

## 2019-09-24 DIAGNOSIS — M25511 Pain in right shoulder: Secondary | ICD-10-CM | POA: Diagnosis not present

## 2019-09-24 NOTE — Telephone Encounter (Signed)
I'm calling to schedule sx with Dr. Amalia Hailey. If you would call me back please at [phone].

## 2019-09-25 ENCOUNTER — Telehealth: Payer: Self-pay | Admitting: *Deleted

## 2019-09-25 NOTE — Progress Notes (Signed)
   HPI: 69 year old female presenting today for follow up evaluation of insertional Achilles tendinitis of the right lower extremity. She reports continued pain from the area. She has been wearing open-heeled shoes and taking Meloxicam as directed with little relief. Walking increases her symptoms. Patient is here for further evaluation and treatment.    Past Medical History:  Diagnosis Date  . Allergy   . Atrial fibrillation ([place])   . Cancer ([place])    basal cell  . Cardiomyopathy, nonischemic ([place])   . Carpal tunnel syndrome    bilateral  . Congestive heart failure ([place])    normalization of EF with medical therapy  . Dysrhythmia 08/31/2015   long RP tachycardia  . Environmental and seasonal allergies   . Female cystocele   . Hyperlipidemia   . Hypertension   . Hyperthyroidism   . Migraines   . Overactive detrusor   . Personal history of colonic polyps   . PONV (postoperative nausea and vomiting)   . SUI (stress urinary incontinence, female)   . Uterovaginal prolapse   . UTI (urinary tract infection)   . Vaginal atrophy       Physical Exam: General: The patient is alert and oriented x3 in no acute distress.  Dermatology: Skin is warm, dry and supple bilateral lower extremities. Negative for open lesions or macerations.  Vascular: Palpable pedal pulses bilaterally. No edema or erythema noted. Capillary refill within normal limits.  Neurological: Epicritic and protective threshold grossly intact bilaterally.   Musculoskeletal Exam: Pain on palpation noted to the posterior tubercle of the right calcaneus at the insertion of the Achilles tendon consistent with retrocalcaneal bursitis. Range of motion within normal limits. Muscle strength 5/5 in all muscle groups bilateral lower extremities.   Assessment: 1. Insertional Achilles tendinitis right 2. Retrocalcaneal bursitis 3. Posterior heel spur right    Plan of Care:  1. Patient was evaluated.  2. Today we discussed  the conservative versus surgical management of the presenting pathology. The patient opts for surgical management. All possible complications and details of the procedure were explained. All patient questions were answered. No guarantees were expressed or implied. 3. Authorization for surgery was initiated today. Surgery will consist of posterior heel spur resection with repair Achilles right.  4. Return to clinic one week post op.    Edrick Kins, DPM [facility]  Dr. Edrick Kins, [place]                                        [place], [place] [address]                Office [phone]  Fax [phone]

## 2019-09-25 NOTE — Telephone Encounter (Signed)
"  I've been trying to call to get my surgery scheduled."  My apologies, I see where you have been calling.  I have been in training, so, I haven't been able to answer your calls.  Do you have a date that you like?  Dr. Rebekah Chesterfield next available date is November 20, 2019.  "Okay, schedule me for November 20, 2019.  However, if he has a cancellation before that date, can you let me know?"  Yes, I will let you know.  You need to register with the surgical center via their online portal, the instructions are in the brochure that we gave you.  It says [employer].  Someone from the surgical center will call and give you your arrival time a day or two prior to your surgery date.

## 2019-09-26 ENCOUNTER — Other Ambulatory Visit: Payer: Self-pay

## 2019-09-26 ENCOUNTER — Encounter: Payer: Self-pay | Admitting: Endocrinology

## 2019-09-26 ENCOUNTER — Ambulatory Visit (INDEPENDENT_AMBULATORY_CARE_PROVIDER_SITE_OTHER): Payer: Medicare Other | Admitting: Endocrinology

## 2019-09-26 DIAGNOSIS — E0590 Thyrotoxicosis, unspecified without thyrotoxic crisis or storm: Secondary | ICD-10-CM | POA: Diagnosis not present

## 2019-09-26 NOTE — Progress Notes (Signed)
Subjective:    [name], female    DOB: May 13, 1951, 69 y.o.   MRN: [id number]  HPI  telehealth visit today via doxy video visit.  Alternatives to telehealth are presented to this patient, and the patient agrees to the telehealth visit.  Pt is advised of the cost of the visit, and agrees to this, also.   Patient is at home, and I am at the office.   Persons attending the telehealth visit: the patient and I.   Pt returns for f/u of hyperthyroidism (dx'ed 2015, when she presented with SVT; she was rx'ed with an uncertain medication for a few months in 2015; she has never had thyroid imaging; she declines RAI).  pt states she feels not different, and well in general.  Specifically, she denies palpitations, excessive diaphoresis, and tremor.   Past Medical History:  Diagnosis Date  . Allergy   . Atrial fibrillation ([place])   . Cancer ([place])    basal cell  . Cardiomyopathy, nonischemic ([place])   . Carpal tunnel syndrome    bilateral  . Congestive heart failure ([place])    normalization of EF with medical therapy  . Dysrhythmia 08/31/2015   long RP tachycardia  . Environmental and seasonal allergies   . Female cystocele   . Hyperlipidemia   . Hypertension   . Hyperthyroidism   . Migraines   . Overactive detrusor   . Personal history of colonic polyps   . PONV (postoperative nausea and vomiting)   . SUI (stress urinary incontinence, female)   . Uterovaginal prolapse   . UTI (urinary tract infection)   . Vaginal atrophy     Past Surgical History:  Procedure Laterality Date  . ANTERIOR AND POSTERIOR VAGINAL REPAIR W/ SACROSPINOUS LIGAMENT SUSPENSION  10/18/2012  . CARDIAC CATHETERIZATION N/A 11/26/2015   Procedure: Right/Left Heart Cath and Coronary Angiography;  Surgeon: Jolaine Artist, MD;  Location: [place] [facility];  Service: Cardiovascular;  Laterality: N/A;  . CHOLECYSTECTOMY  2006  . COLONOSCOPY WITH PROPOFOL N/A 03/19/2017   Procedure: COLONOSCOPY WITH  PROPOFOL;  Surgeon: Lollie Sails, MD;  Location: [facility] ENDOSCOPY;  Service: Endoscopy;  Laterality: N/A;  . COLPOPEXY  10/18/2012  . CYSTOURETHROSCOPY  10/18/2012  . ESOPHAGOGASTRODUODENOSCOPY (EGD) WITH PROPOFOL N/A 10/03/2016   Procedure: ESOPHAGOGASTRODUODENOSCOPY (EGD) WITH PROPOFOL;  Surgeon: Lollie Sails, MD;  Location: [facility] ENDOSCOPY;  Service: Endoscopy;  Laterality: N/A;  . EYE SURGERY     cataract extration  . FINGER SURGERY    . HYSTEROSCOPY  2011, 10/2010  . OOPHORECTOMY    . REVERSE SHOULDER ARTHROPLASTY Right 06/17/2019   Procedure: REVERSE SHOULDER ARTHROPLASTY;  Surgeon: Corky Mull, MD;  Location: [facility] ORS;  Service: Orthopedics;  Laterality: Right;  . TONSILLECTOMY    . TUBAL LIGATION    . VAGINAL HYSTERECTOMY     midurethral sling    Social History   Socioeconomic History  . Marital status: Married    Spouse name: Not on file  . Number of children: 2  . Years of education: Not on file  . Highest education level: Not on file  Occupational History  . Not on file  Tobacco Use  . Smoking status: Never Smoker  . Smokeless tobacco: Never Used  Substance and Sexual Activity  . Alcohol use: Yes    Alcohol/week: 0.0 standard drinks    Comment: Socially  . Drug use: No  . Sexual activity: Not on file  Other Topics Concern  .  Not on file  Social History Narrative  . Not on file   Social Determinants of Health   Financial Resource Strain: Low Risk   . Difficulty of Paying Living Expenses: Not hard at all  Food Insecurity: No Food Insecurity  . Worried About Charity fundraiser in the Last Year: Never true  . Ran Out of Food in the Last Year: Never true  Transportation Needs: No Transportation Needs  . Lack of Transportation (Medical): No  . Lack of Transportation (Non-Medical): No  Physical Activity:   . Days of Exercise per Week: Not on file  . Minutes of Exercise per Session: Not on file  Stress:   . Feeling of Stress : Not on file  Social  Connections:   . Frequency of Communication with Friends and Family: Not on file  . Frequency of Social Gatherings with Friends and Family: Not on file  . Attends Religious Services: Not on file  . Active Member of Clubs or Organizations: Not on file  . Attends Archivist Meetings: Not on file  . Marital Status: Not on file  Intimate Partner Violence: Not At Risk  . Fear of Current or Ex-Partner: No  . Emotionally Abused: No  . Physically Abused: No  . Sexually Abused: No    Current Outpatient Medications on File Prior to Visit  Medication Sig Dispense Refill  . acetaminophen (TYLENOL) 500 MG tablet Take 500-1,000 mg by mouth every 6 (six) hours as needed for moderate pain or headache.    . ARTIFICIAL TEAR SOLUTION OP Place 1 drop into both eyes daily as needed (dry eyes).    Marland Kitchen aspirin EC 325 MG tablet Take 1 tablet (325 mg total) by mouth daily. 30 tablet 0  . calcium carbonate (OSCAL) 1500 (600 Ca) MG TABS tablet Take 1,500 mg by mouth daily.     . calcium carbonate (TUMS - DOSED IN MG ELEMENTAL CALCIUM) 500 MG chewable tablet Chew 1 tablet by mouth daily as needed for indigestion or heartburn.    . carvedilol (COREG) 12.5 MG tablet Take 1 tablet (12.5 mg total) by mouth 2 (two) times daily with a meal. 180 tablet 2  . Cholecalciferol 1000 units capsule Take 1,000 Units by mouth daily.    . diphenhydramine-acetaminophen (TYLENOL PM) 25-500 MG TABS tablet Take 1 tablet by mouth at bedtime as needed (sleep).     . fexofenadine (ALLEGRA) 180 MG tablet Take 180 mg by mouth at bedtime.     . furosemide (LASIX) 20 MG tablet TAKE 1 TABLET (20 MG TOTAL) BY MOUTH EVERY OTHER DAY. 45 tablet 3  . lisinopril (ZESTRIL) 5 MG tablet Take 1 tablet (5 mg total) by mouth at bedtime. Reported on 11/11/2015 (Patient taking differently: Take 5 mg by mouth at bedtime. ) 90 tablet 3  . methimazole (TAPAZOLE) 10 MG tablet Take 5 mg by mouth daily.    . pantoprazole (PROTONIX) 40 MG tablet Take 40 mg  by mouth daily.    . rosuvastatin (CRESTOR) 10 MG tablet Take 1 tablet (10 mg total) by mouth daily. 30 tablet 1  . spironolactone (ALDACTONE) 25 MG tablet Take 0.5 tablets (12.5 mg total) by mouth daily. 45 tablet 3  . SUMAtriptan (IMITREX) 100 MG tablet TAKE 1 TABLET BY MOUTH EVERY DAY AS NEEDED FOR MIGRAINE. MAY REPEAT IN 2 HOURS IF HEADACHE (Patient taking differently: Take 100 mg by mouth every 2 (two) hours as needed for migraine. ) 9 tablet 0  . triamcinolone (NASACORT  ALLERGY 24HR) 55 MCG/ACT AERO nasal inhaler Place 1 spray into the nose daily as needed (allergies).    . zonisamide (ZONEGRAN) 100 MG capsule TAKE 1 CAPSULE BY MOUTH EVERY DAY 90 capsule 0   No current facility-administered medications on file prior to visit.    No Known Allergies  Family History  Problem Relation Age of Onset  . Hypertension Mother   . Stroke Mother   . Diabetes Mother   . Hypercholesterolemia Father   . Heart disease Father        MI  . Hypertension Father   . Stroke Father   . Breast cancer Neg Hx   . Colon cancer Neg Hx   . Thyroid disease Neg Hx     LMP 10/18/2012    Review of Systems Denies fever.     Objective:   Physical Exam       Assessment & Plan:  Hyperthyroidism, due for recheck SVT: in this setting, we should adjust medication for even a slight TSH abnormality  Patient Instructions  If ever you have fever while taking methimazole, stop it and call us, even if the reason is obvious, because of the risk of a rare side-effect.   Blood tests are requested for you today.  We'll let you know about the results.   It is best to never miss the medication.  However, if you do miss it, next best is to double up the next time.   If this blood test is normal, please come back for a follow-up appointment in 6 months.

## 2019-09-26 NOTE — Patient Instructions (Addendum)
If ever you have fever while taking methimazole, stop it and call us, even if the reason is obvious, because of the risk of a rare side-effect.   Blood tests are requested for you today.  We'll let you know about the results.   It is best to never miss the medication.  However, if you do miss it, next best is to double up the next time.   If this blood test is normal, please come back for a follow-up appointment in 6 months.

## 2019-09-29 DIAGNOSIS — G8929 Other chronic pain: Secondary | ICD-10-CM | POA: Diagnosis not present

## 2019-09-29 DIAGNOSIS — N764 Abscess of vulva: Secondary | ICD-10-CM | POA: Diagnosis not present

## 2019-09-29 DIAGNOSIS — Z96611 Presence of right artificial shoulder joint: Secondary | ICD-10-CM | POA: Diagnosis not present

## 2019-09-29 DIAGNOSIS — M25611 Stiffness of right shoulder, not elsewhere classified: Secondary | ICD-10-CM | POA: Diagnosis not present

## 2019-09-29 DIAGNOSIS — L720 Epidermal cyst: Secondary | ICD-10-CM | POA: Diagnosis not present

## 2019-09-29 DIAGNOSIS — M6281 Muscle weakness (generalized): Secondary | ICD-10-CM | POA: Diagnosis not present

## 2019-09-29 DIAGNOSIS — M25511 Pain in right shoulder: Secondary | ICD-10-CM | POA: Diagnosis not present

## 2019-09-30 ENCOUNTER — Other Ambulatory Visit: Payer: Self-pay

## 2019-09-30 ENCOUNTER — Other Ambulatory Visit (INDEPENDENT_AMBULATORY_CARE_PROVIDER_SITE_OTHER): Payer: Medicare Other

## 2019-09-30 DIAGNOSIS — E0590 Thyrotoxicosis, unspecified without thyrotoxic crisis or storm: Secondary | ICD-10-CM

## 2019-10-01 DIAGNOSIS — M25511 Pain in right shoulder: Secondary | ICD-10-CM | POA: Diagnosis not present

## 2019-10-01 DIAGNOSIS — M25611 Stiffness of right shoulder, not elsewhere classified: Secondary | ICD-10-CM | POA: Diagnosis not present

## 2019-10-01 DIAGNOSIS — M6281 Muscle weakness (generalized): Secondary | ICD-10-CM | POA: Diagnosis not present

## 2019-10-01 DIAGNOSIS — G8929 Other chronic pain: Secondary | ICD-10-CM | POA: Diagnosis not present

## 2019-10-01 DIAGNOSIS — Z96611 Presence of right artificial shoulder joint: Secondary | ICD-10-CM | POA: Diagnosis not present

## 2019-10-01 LAB — T4, FREE: Free T4: 0.56 ng/dL — ABNORMAL LOW (ref 0.60–1.60)

## 2019-10-01 LAB — TSH: TSH: 0.79 u[IU]/mL (ref 0.35–4.50)

## 2019-10-06 DIAGNOSIS — M25511 Pain in right shoulder: Secondary | ICD-10-CM | POA: Diagnosis not present

## 2019-10-06 DIAGNOSIS — Z96611 Presence of right artificial shoulder joint: Secondary | ICD-10-CM | POA: Diagnosis not present

## 2019-10-09 DIAGNOSIS — Z96611 Presence of right artificial shoulder joint: Secondary | ICD-10-CM | POA: Diagnosis not present

## 2019-10-09 DIAGNOSIS — M25511 Pain in right shoulder: Secondary | ICD-10-CM | POA: Diagnosis not present

## 2019-10-09 DIAGNOSIS — M6281 Muscle weakness (generalized): Secondary | ICD-10-CM | POA: Diagnosis not present

## 2019-10-09 DIAGNOSIS — G8929 Other chronic pain: Secondary | ICD-10-CM | POA: Diagnosis not present

## 2019-10-09 DIAGNOSIS — M25611 Stiffness of right shoulder, not elsewhere classified: Secondary | ICD-10-CM | POA: Diagnosis not present

## 2019-10-11 ENCOUNTER — Other Ambulatory Visit: Payer: Self-pay | Admitting: Internal Medicine

## 2019-10-15 ENCOUNTER — Other Ambulatory Visit: Payer: Self-pay | Admitting: Internal Medicine

## 2019-10-15 DIAGNOSIS — Z1231 Encounter for screening mammogram for malignant neoplasm of breast: Secondary | ICD-10-CM

## 2019-10-21 ENCOUNTER — Other Ambulatory Visit: Payer: Medicare Other

## 2019-10-21 ENCOUNTER — Other Ambulatory Visit: Payer: Self-pay | Admitting: Internal Medicine

## 2019-10-27 ENCOUNTER — Encounter: Payer: Self-pay | Admitting: Internal Medicine

## 2019-10-27 DIAGNOSIS — Z03818 Encounter for observation for suspected exposure to other biological agents ruled out: Secondary | ICD-10-CM | POA: Diagnosis not present

## 2019-10-27 DIAGNOSIS — U071 COVID-19: Secondary | ICD-10-CM | POA: Diagnosis not present

## 2019-10-28 ENCOUNTER — Other Ambulatory Visit: Payer: Self-pay

## 2019-10-28 ENCOUNTER — Encounter: Payer: Self-pay | Admitting: Internal Medicine

## 2019-10-28 ENCOUNTER — Telehealth (INDEPENDENT_AMBULATORY_CARE_PROVIDER_SITE_OTHER): Payer: Medicare Other | Admitting: Internal Medicine

## 2019-10-28 DIAGNOSIS — U071 COVID-19: Secondary | ICD-10-CM

## 2019-10-28 DIAGNOSIS — I420 Dilated cardiomyopathy: Secondary | ICD-10-CM | POA: Diagnosis not present

## 2019-10-28 NOTE — Progress Notes (Signed)
[name], female   DOB: May 04, 1951, 69 y.o.   MRN: [id number]   Virtual Visit via video Note  This visit type was conducted due to national recommendations for restrictions regarding the COVID-19 pandemic (e.g. social distancing).  This format is felt to be most appropriate for this patient at this time.  All issues noted in this document were discussed and addressed.  No physical exam was performed (except for noted visual exam findings with Video Visits).   I connected with Weyman Rodney by a video enabled telemedicine application and verified that I am speaking with the correct person using two identifiers. Location patient: home Location provider: work  Persons participating in the virtual visit: patient, provider  Tje limitations, risks, security and privacy concerns of performing an evaluation and management service by video and the availability of in person appointments have been discussed. The patient expressed understanding and agreed to proceed.   Reason for visit: work in appt  HPI: Reports symptoms start 10/26/19.  Noticed fever with Tmax 102.  Also reported increased congestion, chills and body aches.  Increased sinus pressure.  Denies chest congestion or chest tightness.  No chest pain or sob.  No loss of taste or smell.  No vomiting or diarrhea.  Does reports some nausea.  Decreased appetite, but is eating.  Using cough lozenges.  Went to acute care 10/27/19.  Tested positive for covid.     ROS: See pertinent positives and negatives per HPI.  Past Medical History:  Diagnosis Date  . Allergy   . Atrial fibrillation ([place])   . Cancer ([place])    basal cell  . Cardiomyopathy, nonischemic ([place])   . Carpal tunnel syndrome    bilateral  . Congestive heart failure ([place])    normalization of EF with medical therapy  . Dysrhythmia 08/31/2015   long RP tachycardia  . Environmental and seasonal allergies   . Female cystocele   . Hyperlipidemia   .  Hypertension   . Hyperthyroidism   . Migraines   . Overactive detrusor   . Personal history of colonic polyps   . PONV (postoperative nausea and vomiting)   . SUI (stress urinary incontinence, female)   . Uterovaginal prolapse   . UTI (urinary tract infection)   . Vaginal atrophy     Past Surgical History:  Procedure Laterality Date  . ANTERIOR AND POSTERIOR VAGINAL REPAIR W/ SACROSPINOUS LIGAMENT SUSPENSION  10/18/2012  . CARDIAC CATHETERIZATION N/A 11/26/2015   Procedure: Right/Left Heart Cath and Coronary Angiography;  Surgeon: Jolaine Artist, MD;  Location: [place] [facility];  Service: Cardiovascular;  Laterality: N/A;  . CHOLECYSTECTOMY  2006  . COLONOSCOPY WITH PROPOFOL N/A 03/19/2017   Procedure: COLONOSCOPY WITH PROPOFOL;  Surgeon: Lollie Sails, MD;  Location: [facility] ENDOSCOPY;  Service: Endoscopy;  Laterality: N/A;  . COLPOPEXY  10/18/2012  . CYSTOURETHROSCOPY  10/18/2012  . ESOPHAGOGASTRODUODENOSCOPY (EGD) WITH PROPOFOL N/A 10/03/2016   Procedure: ESOPHAGOGASTRODUODENOSCOPY (EGD) WITH PROPOFOL;  Surgeon: Lollie Sails, MD;  Location: [facility] ENDOSCOPY;  Service: Endoscopy;  Laterality: N/A;  . EYE SURGERY     cataract extration  . FINGER SURGERY    . HYSTEROSCOPY  2011, 10/2010  . OOPHORECTOMY    . REVERSE SHOULDER ARTHROPLASTY Right 06/17/2019   Procedure: REVERSE SHOULDER ARTHROPLASTY;  Surgeon: Corky Mull, MD;  Location: [facility] ORS;  Service: Orthopedics;  Laterality: Right;  . TONSILLECTOMY    . TUBAL LIGATION    . VAGINAL HYSTERECTOMY  midurethral sling    Family History  Problem Relation Age of Onset  . Hypertension Mother   . Stroke Mother   . Diabetes Mother   . Hypercholesterolemia Father   . Heart disease Father        MI  . Hypertension Father   . Stroke Father   . Breast cancer Neg Hx   . Colon cancer Neg Hx   . Thyroid disease Neg Hx     SOCIAL HX: reviewed.    Current Outpatient Medications:  .  acetaminophen (TYLENOL) 500 MG  tablet, Take 500-1,000 mg by mouth every 6 (six) hours as needed for moderate pain or headache., Disp: , Rfl:  .  ARTIFICIAL TEAR SOLUTION OP, Place 1 drop into both eyes daily as needed (dry eyes)., Disp: , Rfl:  .  calcium carbonate (OSCAL) 1500 (600 Ca) MG TABS tablet, Take 1,500 mg by mouth daily. , Disp: , Rfl:  .  calcium carbonate (TUMS - DOSED IN MG ELEMENTAL CALCIUM) 500 MG chewable tablet, Chew 1 tablet by mouth daily as needed for indigestion or heartburn., Disp: , Rfl:  .  carvedilol (COREG) 12.5 MG tablet, Take 1 tablet (12.5 mg total) by mouth 2 (two) times daily with a meal., Disp: 180 tablet, Rfl: 2 .  Cholecalciferol 1000 units capsule, Take 1,000 Units by mouth daily., Disp: , Rfl:  .  diphenhydramine-acetaminophen (TYLENOL PM) 25-500 MG TABS tablet, Take 1 tablet by mouth at bedtime as needed (sleep). , Disp: , Rfl:  .  fexofenadine (ALLEGRA) 180 MG tablet, Take 180 mg by mouth at bedtime. , Disp: , Rfl:  .  furosemide (LASIX) 20 MG tablet, TAKE 1 TABLET (20 MG TOTAL) BY MOUTH EVERY OTHER DAY., Disp: 45 tablet, Rfl: 3 .  lisinopril (ZESTRIL) 5 MG tablet, Take 1 tablet (5 mg total) by mouth at bedtime. Reported on 11/11/2015 (Patient taking differently: Take 5 mg by mouth at bedtime. ), Disp: 90 tablet, Rfl: 3 .  methimazole (TAPAZOLE) 10 MG tablet, Take 5 mg by mouth daily., Disp: , Rfl:  .  pantoprazole (PROTONIX) 40 MG tablet, Take 40 mg by mouth daily., Disp: , Rfl:  .  rosuvastatin (CRESTOR) 10 MG tablet, TAKE 1 TABLET BY MOUTH EVERY DAY, Disp: 30 tablet, Rfl: 1 .  spironolactone (ALDACTONE) 25 MG tablet, Take 0.5 tablets (12.5 mg total) by mouth daily., Disp: 45 tablet, Rfl: 3 .  SUMAtriptan (IMITREX) 100 MG tablet, TAKE 1 TABLET BY MOUTH EVERY DAY AS NEEDED FOR MIGRAINE. MAY REPEAT IN 2 HOURS IF HEADACHE (Patient taking differently: Take 100 mg by mouth every 2 (two) hours as needed for migraine. ), Disp: 9 tablet, Rfl: 0 .  triamcinolone (NASACORT ALLERGY 24HR) 55 MCG/ACT AERO  nasal inhaler, Place 1 spray into the nose daily as needed (allergies)., Disp: , Rfl:  .  zonisamide (ZONEGRAN) 100 MG capsule, TAKE 1 CAPSULE BY MOUTH EVERY DAY, Disp: 90 capsule, Rfl: 0  EXAM:  VITALS per patient if applicable:  [id number] when woke up today.   GENERAL: alert, oriented, appears well and in no acute distress  HEENT: atraumatic, conjunttiva clear, no obvious abnormalities on inspection of external nose and ears  NECK: normal movements of the head and neck  LUNGS: on inspection no signs of respiratory distress, breathing rate appears normal, no obvious gross SOB, gasping or wheezing  CV: no obvious cyanosis  PSYCH/NEURO: pleasant and cooperative, no obvious depression or anxiety, speech and thought processing grossly intact  ASSESSMENT AND PLAN:  Discussed  the following assessment and plan:  COVID-19 virus infection Symptoms as outlined (started 10/26/19).  Tested positive for covid 10/27/19.  Denies chest pain, chest tightness or sob.  Does report the sinus pressure and congestion as outlined.  Treat with nasacort nasal spray, saline nasal spray, robitussin as directed.  Rest.  Fluids.  Discussed monoclonal antibody infusion.  She did agree for me to submit information.  Discussed quarantine.  Follow closely.    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HCC) Breathing stable.  Continue current medication regimen.     I discussed the assessment and treatment plan with the patient. The patient was provided an opportunity to ask questions and all were answered. The patient agreed with the plan and demonstrated an understanding of the instructions.   The patient was advised to call back or seek an in-person evaluation if the symptoms worsen or if the condition fails to improve as anticipated.   Einar Pheasant, MD

## 2019-10-28 NOTE — Telephone Encounter (Signed)
Called patient. Confirmed doing ok. Patient tested positive for COVID yesterday. Daughter in law tested positive as well. Symptoms started Sunday 2/28- fever, cough, congestion. Was evaluated at [facility]. She is questioning the antibody infusion- acute care provider advised that she may be a candidate. Briefly discussed infusion with her and set her up for virtual appt to discuss with PCP. Patient also noted that her BP was a little low for her when she was at the walk-in. 81/61 on first check and then 87/62 second check. Was checked with automatic cuff. She takes lisinopril 5 mg qhs. Not having any acute symptoms at this time. Noted that the other day she was having a little bit of dizziness when she changed positions too quickly but ok now. Patient has been scheduled to see you tomorrow. I have requested the test results from [facility] clinic as well.

## 2019-10-29 ENCOUNTER — Telehealth: Payer: Self-pay | Admitting: Physician Assistant

## 2019-10-29 ENCOUNTER — Telehealth: Payer: Medicare Other | Admitting: Internal Medicine

## 2019-10-29 NOTE — Telephone Encounter (Signed)
Patient was referred by Dr. Nicki Reaper for infusion.   Called to discuss with [name] about Covid symptoms and the use of bamlanivimab, a monoclonal antibody infusion for those with mild to moderate Covid symptoms and at a high risk of hospitalization.     Pt is qualified for this infusion at the [facility] infusion center due to co-morbid conditions and/or a member of an at-risk group, however declines infusion at this time. Symptoms tier reviewed as well as criteria for ending isolation.  Symptoms reviewed that would warrant ED/Hospital evaluation. Preventative practices reviewed. Patient verbalized understanding. Patient advised to call back if he decides that he does want to get infusion. Callback number to the infusion center given. Patient advised to go to Urgent care or ED with severe symptoms. Last date she would be eligible for infusion is 11/04/19   Patient Active Problem List   Diagnosis Date Noted  . 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 Pain of left heel 05/03/2019  . Immunization counseling 05/03/2019  . Right shoulder pain 12/21/2018  . Leukocytosis 12/21/2018  . Hyperglycemia 12/21/2018  . Hyperthyroidism 06/21/2018  . Pain of right heel 05/04/2018  . Snoring 01/06/2018  . Impingement syndrome of shoulder region 09/17/2017  . GERD (gastroesophageal reflux disease) 06/24/2017  . Bilateral carpal tunnel syndrome 11/29/2016  . Carpal tunnel syndrome 11/08/2016  . Sinusitis 08/13/2016  . Epigastric abdominal pain 05/04/2016  . Left sided chest pain 12/05/2015  . 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[place])   . NSVT (nonsustained ventricular tachycardia) ([place])   . Environmental allergies 09/29/2015  . Chronic systolic heart failure ([place]) 09/22/2015  . SVT (supraventricular tachycardia) ([place]) 09/01/2015  . Chronic migraine without aura without status migrainosus, not intractable 06/24/2015  . Health care maintenance 04/13/2015  . History of  migraine headaches 11/11/2012  . Hypercholesterolemia 11/11/2012  . History of colonic polyps 11/11/2012  . Migraines 10/02/2012  . 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 SUI (stress urinary incontinence, female) 10/02/2012  . Postop check 10/02/2012  . Cystocele 09/23/2012  . Incomplete emptying of bladder 09/23/2012     Leanor Kail, PA

## 2019-10-29 NOTE — Telephone Encounter (Signed)
Waiting for COVID test to be sent then will refer.

## 2019-10-29 NOTE — Telephone Encounter (Signed)
Pt was evaluated.  Please make sure referral placed for monoclonal antibody infusion.

## 2019-10-29 NOTE — Telephone Encounter (Signed)
Referred for infusion

## 2019-10-30 ENCOUNTER — Telehealth: Payer: Self-pay | Admitting: Internal Medicine

## 2019-10-30 ENCOUNTER — Encounter: Payer: Self-pay | Admitting: Internal Medicine

## 2019-10-30 DIAGNOSIS — U071 COVID-19: Secondary | ICD-10-CM | POA: Insufficient documentation

## 2019-10-30 NOTE — Assessment & Plan Note (Signed)
Symptoms as outlined (started 10/26/19).  Tested positive for covid 10/27/19.  Denies chest pain, chest tightness or sob.  Does report the sinus pressure and congestion as outlined.  Treat with nasacort nasal spray, saline nasal spray, robitussin as directed.  Rest.  Fluids.  Discussed monoclonal antibody infusion.  She did agree for me to submit information.  Discussed quarantine.  Follow closely.

## 2019-10-30 NOTE — Telephone Encounter (Signed)
My chart message sent to pt regarding update.  

## 2019-10-30 NOTE — Assessment & Plan Note (Signed)
Breathing stable.  Continue current medication regimen.

## 2019-11-03 ENCOUNTER — Ambulatory Visit (INDEPENDENT_AMBULATORY_CARE_PROVIDER_SITE_OTHER): Payer: Medicare Other | Admitting: Family Medicine

## 2019-11-03 VITALS — BP 110/88 | HR 90 | Temp 98.4°F | Ht 61.0 in | Wt 159.4 lb

## 2019-11-03 DIAGNOSIS — I959 Hypotension, unspecified: Secondary | ICD-10-CM | POA: Diagnosis not present

## 2019-11-03 DIAGNOSIS — U071 COVID-19: Secondary | ICD-10-CM

## 2019-11-03 DIAGNOSIS — R5081 Fever presenting with conditions classified elsewhere: Secondary | ICD-10-CM | POA: Diagnosis not present

## 2019-11-03 DIAGNOSIS — R110 Nausea: Secondary | ICD-10-CM

## 2019-11-03 DIAGNOSIS — N3001 Acute cystitis with hematuria: Secondary | ICD-10-CM

## 2019-11-03 LAB — POC URINALSYSI DIPSTICK (AUTOMATED)
Bilirubin, UA: NEGATIVE
Glucose, UA: NEGATIVE
Ketones, UA: NEGATIVE
Nitrite, UA: NEGATIVE
Protein, UA: POSITIVE — AB
Spec Grav, UA: 1.02 (ref 1.010–1.025)
Urobilinogen, UA: 0.2 E.U./dL
pH, UA: 6.5 (ref 5.0–8.0)

## 2019-11-03 MED ORDER — CEFDINIR 300 MG PO CAPS: 300.0000 mg | ORAL_CAPSULE | Freq: Two times a day (BID) | ORAL | 0 refills | Status: AC

## 2019-11-03 MED ORDER — PROMETHAZINE-DM 6.25-15 MG/5ML PO SYRP: 5.0000 mL | ORAL_SOLUTION | Freq: Four times a day (QID) | ORAL | 0 refills | Status: DC | PRN

## 2019-11-03 MED ORDER — ONDANSETRON 4 MG PO TBDP: 4.0000 mg | ORAL_TABLET | Freq: Three times a day (TID) | ORAL | 0 refills | Status: DC | PRN

## 2019-11-03 NOTE — Telephone Encounter (Signed)
Please call and confirm pt doing ok.  Has she recheck her blood pressure.  Is she eating and staying hydrated.  Given blood pressure, persistent fever, etc - needs evaluation.

## 2019-11-03 NOTE — Progress Notes (Signed)
[name], female    DOB: 21-Jun-1951, 69 y.o.   MRN: [id number]  PCP: Einar Pheasant, MD  No chief complaint on file.   Subjective:  HPI Kristi Greer is a 69 y.o. female presents to [facility] clinic for evaluation of symptoms related to COVID-19. Diagnosed with COVID-19 10/27/19 with symptom onset 10 days ago.She has had a persistent fever, shortness of breath, nausea, loose stool, and hypotension since onset of symptoms. She declined infusion therapy. She is severely fatigued. She is very concerned about her blood pressure as it has dropped as low as 80's/40's range. She is hydrating well with water. She endorses dysuria and cloudiness of her urine. Reports with illness she is prone to urinary tract infections and previously admitted for urosepsis.  Fever TMAX 100.8 F today. Last fever this morning which resolved with taking tylenol.  Kristi Greer is high risk for complications related to COVID-19 due to underlying CHF , age >88, and obesity.  Review of Systems Pertinent negatives listed in HPI  Patient Active Problem List   Diagnosis Date Noted  . COVID-19 virus infection 10/30/2019  . 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 Pain of left heel 05/03/2019  . Immunization counseling 05/03/2019  . Right shoulder pain 12/21/2018  . Leukocytosis 12/21/2018  . Hyperglycemia 12/21/2018  . Hyperthyroidism 06/21/2018  . Pain of right heel 05/04/2018  . Snoring 01/06/2018  . Impingement syndrome of shoulder region 09/17/2017  . GERD (gastroesophageal reflux disease) 06/24/2017  . Bilateral carpal tunnel syndrome 11/29/2016  . Carpal tunnel syndrome 11/08/2016  . Sinusitis 08/13/2016  . Epigastric abdominal pain 05/04/2016  . Left sided chest pain 12/05/2015  . 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[place])   . NSVT (nonsustained ventricular tachycardia) ([place])   . Environmental allergies 09/29/2015  . Chronic systolic heart failure ([place])  09/22/2015  . SVT (supraventricular tachycardia) ([place]) 09/01/2015  . Chronic migraine without aura without status migrainosus, not intractable 06/24/2015  . Health care maintenance 04/13/2015  . History of migraine headaches 11/11/2012  . Hypercholesterolemia 11/11/2012  . History of colonic polyps 11/11/2012  . Migraines 10/02/2012  . 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 SUI (stress urinary incontinence, female) 10/02/2012  . Postop check 10/02/2012  . Cystocele 09/23/2012  . Incomplete emptying of bladder 09/23/2012      Prior to Admission medications   Medication Sig Start Date End Date Taking? Authorizing Provider  acetaminophen (TYLENOL) 500 MG tablet Take 500-1,000 mg by mouth every 6 (six) hours as needed for moderate pain or headache.   Yes [provider]  ARTIFICIAL TEAR SOLUTION OP Place 1 drop into both eyes daily as needed (dry eyes).   Yes [provider]  calcium carbonate (OSCAL) 1500 (600 Ca) MG TABS tablet Take 1,500 mg by mouth daily.    Yes [provider]  calcium carbonate (TUMS - DOSED IN MG ELEMENTAL CALCIUM) 500 MG chewable tablet Chew 1 tablet by mouth daily as needed for indigestion or heartburn.   Yes [provider]  carvedilol (COREG) 12.5 MG tablet Take 1 tablet (12.5 mg total) by mouth 2 (two) times daily with a meal. 05/27/19  Yes Gollan, Kathlene November, MD  Cholecalciferol 1000 units capsule Take 1,000 Units by mouth daily.   Yes [provider]  diphenhydramine-acetaminophen (TYLENOL PM) 25-500 MG TABS tablet Take 1 tablet by mouth at bedtime as needed (sleep).    Yes [provider]  fexofenadine (ALLEGRA) 180 MG tablet Take 180 mg by mouth  at bedtime.    Yes [provider]  furosemide (LASIX) 20 MG tablet TAKE 1 TABLET (20 MG TOTAL) BY MOUTH EVERY OTHER DAY. 02/03/19  Yes Bensimhon, Shaune Pascal, MD  lisinopril (ZESTRIL) 5 MG tablet Take 1 tablet (5 mg total) by mouth at bedtime. Reported on  11/11/2015 Patient taking differently: Take 5 mg by mouth at bedtime.  01/15/19  Yes Bensimhon, Shaune Pascal, MD  methimazole (TAPAZOLE) 10 MG tablet Take 5 mg by mouth daily.   Yes [provider]  pantoprazole (PROTONIX) 40 MG tablet Take 40 mg by mouth daily.   Yes [provider]  rosuvastatin (CRESTOR) 10 MG tablet TAKE 1 TABLET BY MOUTH EVERY DAY 10/12/19  Yes Einar Pheasant, MD  spironolactone (ALDACTONE) 25 MG tablet Take 0.5 tablets (12.5 mg total) by mouth daily. 12/09/18  Yes Gollan, Kathlene November, MD  SUMAtriptan (IMITREX) 100 MG tablet TAKE 1 TABLET BY MOUTH EVERY DAY AS NEEDED FOR MIGRAINE. MAY REPEAT IN 2 HOURS IF HEADACHE Patient taking differently: Take 100 mg by mouth every 2 (two) hours as needed for migraine.  05/14/19  Yes Einar Pheasant, MD  triamcinolone (NASACORT ALLERGY 24HR) 55 MCG/ACT AERO nasal inhaler Place 1 spray into the nose daily as needed (allergies).   Yes [provider]  zonisamide (ZONEGRAN) 100 MG capsule TAKE 1 CAPSULE BY MOUTH EVERY DAY 07/08/19  Yes Einar Pheasant, MD    Past Medical, Surgical Family and Social History reviewed and updated.    Objective:   Today's Vitals   11/03/19 1840  BP: 110/88  Pulse: 90  Temp: 98.4 F (36.9 C)  TempSrc: Oral  SpO2: 98%  Weight: 159 lb 6 oz (72.3 kg)  Height: 5\' 1"  (1.549 m)    Wt Readings from Last 3 Encounters:  11/03/19 159 lb 6 oz (72.3 kg)  10/28/19 160 lb (72.6 kg)  09/01/19 160 lb (72.6 kg)   Physical Exam Constitutional:      Appearance: She is ill-appearing. She is not toxic-appearing or diaphoretic.  Cardiovascular:     Rate and Rhythm: Normal rate and regular rhythm.     Pulses: Normal pulses.  Pulmonary:     Effort: No respiratory distress.     Breath sounds: No wheezing, rhonchi or rales.     Comments: Dry cough notes on exam  Chest:     Chest wall: No tenderness.  Abdominal:     General: Abdomen is flat.  Musculoskeletal:        General: Normal range of  motion.  Lymphadenopathy:     Cervical: No cervical adenopathy.  Skin:    General: Skin is warm and dry.  Neurological:     Mental Status: She is alert and oriented to person, place, and time.  Psychiatric:        Mood and Affect: Mood normal.        Behavior: Behavior normal.      Assessment & Plan:  1. COVID-19 -My chart home companion ordered. -Patient resides in [place] therefore is out of radius for [facility].  -High risk for complications, requires close follow-up and monitoring. -Return to clinic in 2 days-evaluate fever and blood pressure. - Temperature monitoring; Future  2. Acute Urinary Tract Infection, high risk for urosepsis  -UA abnormal  -Urine Culture pending  -Start Omnicef 300 mg twice daily x 10 days -CBC pending.  -CMP pending evaluate renal function   3. Nausea -Zofran prescribed PRN -CMP pending -check electrolyte statue   4. Fever in  other diseases -likely multifactorial COVID and UTI -Continue Tylenol for fever management - Temperature monitoring  5. Hypotension, unspecified hypotension  -Likely secondary to fever, acute illness and hypovolemia  -Check BP twice daily and prior to evening dose of lisinopril -Hold lisinopril for any BP <110/80 -Hydrate with water and avoid caffeine beverage due to these can cause dehydration. -Give CHF, continue Coreg and Furosemide -Patient to return to [facility] clinic in 2 days for follow-up given instability of home BP readings -Parameters written on discharge summary and discussed with patient that warrant immediate evaluation in ER   Meds ordered this encounter  Medications  . cefdinir (OMNICEF) 300 MG capsule    Sig: Take 1 capsule (300 mg total) by mouth 2 (two) times daily for 10 days.    Dispense:  20 capsule    Refill:  0  . promethazine-dextromethorphan (PROMETHAZINE-DM) 6.25-15 MG/5ML syrup    Sig: Take 5 mLs by mouth 4 (four) times daily as needed for cough.    Dispense:  140 mL    Refill:   0  . ondansetron (ZOFRAN ODT) 4 MG disintegrating tablet    Sig: Take 1 tablet (4 mg total) by mouth every 8 (eight) hours as needed for nausea or vomiting.    Dispense:  20 tablet    Refill:  0    -The patient was given clear instructions to go to ER or return to medical center if symptoms do not improve, worsen or new problems develop. The patient verbalized understanding.     Molli Barrows, FNP-C [facility], PRN Provider  [facility] [place], [place] [facility] Phone: [phone] Clinic Fax: [phone] Clinic Hours: 5:30 pm -7:30 pm (Monday-Friday)

## 2019-11-03 NOTE — Patient Instructions (Addendum)
Check your blood pressure before taking lisinopril.  Hold Lisinopril 5 mg for any blood pressure readings lower than 110/80.  If blood pressure drops below 90/50 -if the top or bottom number drops lower than these parameters- go immediately to the Emergency Department.  You will be notified via MyChart of your labs results.     Hypotension As your heart beats, it forces blood through your body. This force is called blood pressure. If you have hypotension, you have low blood pressure. When your blood pressure is too low, you may not get enough blood to your brain or other parts of your body. This may cause you to feel weak, light-headed, have a fast heartbeat, or even pass out (faint). Low blood pressure may be harmless, or it may cause serious problems. What are the causes?  Blood loss.  Not enough water in the body (dehydration).  Heart problems.  Hormone problems.  Pregnancy.  A very bad infection.  Not having enough of certain nutrients.  Very bad allergic reactions.  Certain medicines. What increases the risk?  Age. The risk increases as you get older.  Conditions that affect the heart or the brain and spinal cord (central nervous system).  Taking certain medicines.  Being pregnant. What are the signs or symptoms?  Feeling: ? Weak. ? Light-headed. ? Dizzy. ? Tired (fatigued).  Blurred vision.  Fast heartbeat.  Passing out, in very bad cases. How is this treated?  Changing your diet. This may involve eating more salt (sodium) or drinking more water.  Taking medicines to raise your blood pressure.  Changing how much you take (the dosage) of some of your medicines.  Wearing compression stockings. These stockings help to prevent blood clots and reduce swelling in your legs. In some cases, you may need to go to the hospital for:  Fluid replacement. This means you will receive fluids through an IV tube.  Blood replacement. This means you will receive  donated blood through an IV tube (transfusion).  Treating an infection or heart problems, if this applies.  Monitoring. You may need to be monitored while medicines that you are taking wear off. Follow these instructions at home: Eating and drinking   Drink enough fluids to keep your pee (urine) pale yellow.  Eat a healthy diet. Follow instructions from your doctor about what you can eat or drink. A healthy diet includes: ? Fresh fruits and vegetables. ? Whole grains. ? Low-fat (lean) meats. ? Low-fat dairy products.  Eat extra salt only as told. Do not add extra salt to your diet unless your doctor tells you to.  Eat small meals often.  Avoid standing up quickly after you eat. Medicines  Take over-the-counter and prescription medicines only as told by your doctor. ? Follow instructions from your doctor about changing how much you take of your medicines, if this applies. ? Do not stop or change any of your medicines on your own. General instructions   Wear compression stockings as told by your doctor.  Get up slowly from lying down or sitting.  Avoid hot showers and a lot of heat as told by your doctor.  Return to your normal activities as told by your doctor. Ask what activities are safe for you.  Do not use any products that contain nicotine or tobacco, such as cigarettes, e-cigarettes, and chewing tobacco. If you need help quitting, ask your doctor.  Keep all follow-up visits as told by your doctor. This is important. Contact a doctor if:  You throw up (vomit).  You have watery poop (diarrhea).  You have a fever for more than 2-3 days.  You feel more thirsty than normal.  You feel weak and tired. Get help right away if:  You have chest pain.  You have a fast or uneven heartbeat.  You lose feeling (have numbness) in any part of your body.  You cannot move your arms or your legs.  You have trouble talking.  You get sweaty or feel light-headed.  You  pass out.  You have trouble breathing.  You have trouble staying awake.  You feel mixed up (confused). Summary  Hypotension is also called low blood pressure. It is when the force of blood pumping through your arteries is too weak.  Hypotension may be harmless, or it may cause serious problems.  Treatment may include changing your diet and medicines, and wearing compression stockings.  In very bad cases, you may need to go to the hospital. This information is not intended to replace advice given to you by your health care provider. Make sure you discuss any questions you have with your health care provider. Document Revised: 02/07/2018 Document Reviewed: 02/07/2018 Elsevier Patient Education  2020 [place].    COVID-19 COVID-19 is a respiratory infection that is caused by a virus called severe acute respiratory syndrome coronavirus 2 (SARS-CoV-2). The disease is also known as coronavirus disease or novel coronavirus. In some people, the virus may not cause any symptoms. In others, it may cause a serious infection. The infection can get worse quickly and can lead to complications, such as:  Pneumonia, or infection of the lungs.  Acute respiratory distress syndrome or ARDS. This is a condition in which fluid build-up in the lungs prevents the lungs from filling with air and passing oxygen into the blood.  Acute respiratory failure. This is a condition in which there is not enough oxygen passing from the lungs to the body or when carbon dioxide is not passing from the lungs out of the body.  Sepsis or septic shock. This is a serious bodily reaction to an infection.  Blood clotting problems.  Secondary infections due to bacteria or fungus.  Organ failure. This is when your body's organs stop working. The virus that causes COVID-19 is contagious. This means that it can spread from person to person through droplets from coughs and sneezes (respiratory secretions). What are the  causes? This illness is caused by a virus. You may catch the virus by:  Breathing in droplets from an infected person. Droplets can be spread by a person breathing, speaking, singing, coughing, or sneezing.  Touching something, like a table or a doorknob, that was exposed to the virus (contaminated) and then touching your mouth, nose, or eyes. What increases the risk? Risk for infection You are more likely to be infected with this virus if you:  Are within 6 feet (2 meters) of a person with COVID-19.  Provide care for or live with a person who is infected with COVID-19.  Spend time in crowded indoor spaces or live in shared housing. Risk for serious illness You are more likely to become seriously ill from the virus if you:  Are 48 years of age or older. The higher your age, the more you are at risk for serious illness.  Live in a nursing home or long-term care facility.  Have cancer.  Have a long-term (chronic) disease such as: ? Chronic lung disease, including chronic obstructive pulmonary disease or asthma. ?  A long-term disease that lowers your body's ability to fight infection (immunocompromised). ? Heart disease, including heart failure, a condition in which the arteries that lead to the heart become narrow or blocked (coronary artery disease), a disease which makes the heart muscle thick, weak, or stiff (cardiomyopathy). ? Diabetes. ? Chronic kidney disease. ? Sickle cell disease, a condition in which red blood cells have an abnormal "sickle" shape. ? Liver disease.  Are obese. What are the signs or symptoms? Symptoms of this condition can range from mild to severe. Symptoms may appear any time from 2 to 14 days after being exposed to the virus. They include:  A fever or chills.  A cough.  Difficulty breathing.  Headaches, body aches, or muscle aches.  Runny or stuffy (congested) nose.  A sore throat.  New loss of taste or smell. Some people may also have  stomach problems, such as nausea, vomiting, or diarrhea. Other people may not have any symptoms of COVID-19. How is this diagnosed? This condition may be diagnosed based on:  Your signs and symptoms, especially if: ? You live in an area with a COVID-19 outbreak. ? You recently traveled to or from an area where the virus is common. ? You provide care for or live with a person who was diagnosed with COVID-19. ? You were exposed to a person who was diagnosed with COVID-19.  A physical exam.  Lab tests, which may include: ? Taking a sample of fluid from the back of your nose and throat (nasopharyngeal fluid), your nose, or your throat using a swab. ? A sample of mucus from your lungs (sputum). ? Blood tests.  Imaging tests, which may include, X-rays, CT scan, or ultrasound. How is this treated? At present, there is no medicine to treat COVID-19. Medicines that treat other diseases are being used on a trial basis to see if they are effective against COVID-19. Your health care provider will talk with you about ways to treat your symptoms. For most people, the infection is mild and can be managed at home with rest, fluids, and over-the-counter medicines. Treatment for a serious infection usually takes places in a hospital intensive care unit (ICU). It may include one or more of the following treatments. These treatments are given until your symptoms improve.  Receiving fluids and medicines through an IV.  Supplemental oxygen. Extra oxygen is given through a tube in the nose, a face mask, or a hood.  Positioning you to lie on your stomach (prone position). This makes it easier for oxygen to get into the lungs.  Continuous positive airway pressure (CPAP) or bi-level positive airway pressure (BPAP) machine. This treatment uses mild air pressure to keep the airways open. A tube that is connected to a motor delivers oxygen to the body.  Ventilator. This treatment moves air into and out of the  lungs by using a tube that is placed in your windpipe.  Tracheostomy. This is a procedure to create a hole in the neck so that a breathing tube can be inserted.  Extracorporeal membrane oxygenation (ECMO). This procedure gives the lungs a chance to recover by taking over the functions of the heart and lungs. It supplies oxygen to the body and removes carbon dioxide. Follow these instructions at home: Lifestyle  If you are sick, stay home except to get medical care. Your health care provider will tell you how long to stay home. Call your health care provider before you go for medical care.  Rest at  home as told by your health care provider.  Do not use any products that contain nicotine or tobacco, such as cigarettes, e-cigarettes, and chewing tobacco. If you need help quitting, ask your health care provider.  Return to your normal activities as told by your health care provider. Ask your health care provider what activities are safe for you. General instructions  Take over-the-counter and prescription medicines only as told by your health care provider.  Drink enough fluid to keep your urine pale yellow.  Keep all follow-up visits as told by your health care provider. This is important. How is this prevented?  There is no vaccine to help prevent COVID-19 infection. However, there are steps you can take to protect yourself and others from this virus. To protect yourself:   Do not travel to areas where COVID-19 is a risk. The areas where COVID-19 is reported change often. To identify high-risk areas and travel restrictions, check the CDC travel website: FatFares.com.br  If you live in, or must travel to, an area where COVID-19 is a risk, take precautions to avoid infection. ? Stay away from people who are sick. ? Wash your hands often with soap and water for 20 seconds. If soap and water are not available, use an alcohol-based hand sanitizer. ? Avoid touching your mouth,  face, eyes, or nose. ? Avoid going out in public, follow guidance from your state and local health authorities. ? If you must go out in public, wear a cloth face covering or face mask. Make sure your mask covers your nose and mouth. ? Avoid crowded indoor spaces. Stay at least 6 feet (2 meters) away from others. ? Disinfect objects and surfaces that are frequently touched every day. This may include:  Counters and tables.  Doorknobs and light switches.  Sinks and faucets.  Electronics, such as phones, remote controls, keyboards, computers, and tablets. To protect others: If you have symptoms of COVID-19, take steps to prevent the virus from spreading to others.  If you think you have a COVID-19 infection, contact your health care provider right away. Tell your health care team that you think you may have a COVID-19 infection.  Stay home. Leave your house only to seek medical care. Do not use public transport.  Do not travel while you are sick.  Wash your hands often with soap and water for 20 seconds. If soap and water are not available, use alcohol-based hand sanitizer.  Stay away from other members of your household. Let healthy household members care for children and pets, if possible. If you have to care for children or pets, wash your hands often and wear a mask. If possible, stay in your own room, separate from others. Use a different bathroom.  Make sure that all people in your household wash their hands well and often.  Cough or sneeze into a tissue or your sleeve or elbow. Do not cough or sneeze into your hand or into the air.  Wear a cloth face covering or face mask. Make sure your mask covers your nose and mouth. Where to find more information  Centers for Disease Control and Prevention: PurpleGadgets.be  World Health Organization: https://www.castaneda.info/ Contact a health care provider if:  You live in or have traveled to an  area where COVID-19 is a risk and you have symptoms of the infection.  You have had contact with someone who has COVID-19 and you have symptoms of the infection. Get help right away if:  You have trouble breathing.  You have pain or pressure in your chest.  You have confusion.  You have bluish lips and fingernails.  You have difficulty waking from sleep.  You have symptoms that get worse. These symptoms may represent a serious problem that is an emergency. Do not wait to see if the symptoms will go away. Get medical help right away. Call your local emergency services (911 in the [place] Do not drive yourself to the hospital. Let the emergency medical personnel know if you think you have COVID-19. Summary  COVID-19 is a respiratory infection that is caused by a virus. It is also known as coronavirus disease or novel coronavirus. It can cause serious infections, such as pneumonia, acute respiratory distress syndrome, acute respiratory failure, or sepsis.  The virus that causes COVID-19 is contagious. This means that it can spread from person to person through droplets from breathing, speaking, singing, coughing, or sneezing.  You are more likely to develop a serious illness if you are 60 years of age or older, have a weak immune system, live in a nursing home, or have chronic disease.  There is no medicine to treat COVID-19. Your health care provider will talk with you about ways to treat your symptoms.  Take steps to protect yourself and others from infection. Wash your hands often and disinfect objects and surfaces that are frequently touched every day. Stay away from people who are sick and wear a mask if you are sick. This information is not intended to replace advice given to you by your health care provider. Make sure you discuss any questions you have with your health care provider. Document Revised: 06/13/2019 Document Reviewed: 09/19/2018 Elsevier Patient Education  [place].

## 2019-11-03 NOTE — Telephone Encounter (Signed)
Has not rechecked pressure. Doing ok. She is able to eat and drink some but has to force herself. I have scheduled her with the resp clinic tonight, husband will take her. Did not want to go to ED.

## 2019-11-04 ENCOUNTER — Other Ambulatory Visit: Payer: Self-pay | Admitting: Internal Medicine

## 2019-11-05 ENCOUNTER — Telehealth: Payer: Self-pay | Admitting: Podiatry

## 2019-11-05 ENCOUNTER — Other Ambulatory Visit: Payer: Medicare Other

## 2019-11-05 ENCOUNTER — Other Ambulatory Visit: Payer: Self-pay | Admitting: Internal Medicine

## 2019-11-05 ENCOUNTER — Ambulatory Visit: Payer: Medicare Other

## 2019-11-05 MED ORDER — SUMATRIPTAN SUCCINATE 100 MG PO TABS: 100.0000 mg | ORAL_TABLET | Freq: Once | ORAL | 0 refills | Status: DC

## 2019-11-05 NOTE — Telephone Encounter (Signed)
Kristi Greer called saying pt called [name] to cancel sx for 03/25 due to having COVID.

## 2019-11-05 NOTE — Telephone Encounter (Signed)
Patient aware. Confirmed doing ok. Will go to ED if continue low BP and decreased PO intake. Has appt with respiratory clinic tomorrow.

## 2019-11-05 NOTE — Telephone Encounter (Signed)
Reviewed message and blood pressures.  Also reviewed respiratory clinic note.  Per respiratory clinic note, she was instructed to hold lisinopril if blood pressure dropped.  Parameters give.  (if persistent decrease, may need to temporarily hold lasix).  Also, if continued low blood pressure, decreased po intake, etc - will need ER evaluation.

## 2019-11-06 ENCOUNTER — Ambulatory Visit (INDEPENDENT_AMBULATORY_CARE_PROVIDER_SITE_OTHER): Payer: Medicare Other | Admitting: Nurse Practitioner

## 2019-11-06 ENCOUNTER — Telehealth: Payer: Self-pay | Admitting: Cardiovascular Disease

## 2019-11-06 ENCOUNTER — Other Ambulatory Visit: Payer: Self-pay

## 2019-11-06 ENCOUNTER — Ambulatory Visit: Payer: Medicare Other

## 2019-11-06 VITALS — BP 104/72 | HR 95 | Temp 96.9°F | Ht 61.0 in | Wt 155.5 lb

## 2019-11-06 DIAGNOSIS — N390 Urinary tract infection, site not specified: Secondary | ICD-10-CM | POA: Diagnosis not present

## 2019-11-06 DIAGNOSIS — I959 Hypotension, unspecified: Secondary | ICD-10-CM | POA: Diagnosis not present

## 2019-11-06 DIAGNOSIS — R110 Nausea: Secondary | ICD-10-CM | POA: Diagnosis not present

## 2019-11-06 DIAGNOSIS — U071 COVID-19: Secondary | ICD-10-CM

## 2019-11-06 NOTE — Progress Notes (Signed)
@Patient  ID: Kristi Greer, female    DOB: [birth year], 69 y.o.   MRN: [id number]  Chief Complaint  Patient presents with  . Post COVID    Tested positive 3/1 Sx: Fatigue, slight cough. Stomach ache started about 2 days ago.    Referring provider: Einar Pheasant, MD   69 year old female with SVT, CHF, GERD, hyperthyroidism, and recently diagnosed with Covid on 10/27/19.   Recent Significant Encounters:   11/03/19 Respiratory clinic visit: Patient was seen for fever and hypotension. Advised to hydrate and hold lisinopril. UA positive for UTI - culture sent. Started on omnicef. Labs overall stable. Tylenol for fever.   HPI  Patient presents today for a post covid care visit. She was seen in the respiratory clinic earlier this week and was found to have hypotension and UTI. Patient was started on omnicef and zofran. She states that she has been compliant with these medications. She states that she is feeling slightly improved since last visit. She reports that her appetite is poor. She is drinking fluids. She has been holding lisinopril and checking blood pressure as directed. BP continues to stay around low 100's/80's. She reports that she has been fever free for 24 hours now. She is ambulatory and is able to perform activities of daily living independently, but does get very fatigued with exertion. Denies f/c/s, hemoptysis, PND, leg swelling. Denies chest pain.     No Known Allergies  Immunization History  Administered Date(s) Administered  . Fluad Quad(high Dose 65+) 04/28/2019  . Influenza Split 05/27/2012, 05/26/2013, 05/15/2014  . Influenza, High Dose Seasonal PF 05/01/2018  . Influenza-Unspecified 05/26/2015  . Pneumococcal Conjugate-13 05/13/2019    Past Medical History:  Diagnosis Date  . Allergy   . Atrial fibrillation ([place])   . Cancer ([place])    basal cell  . Cardiomyopathy, nonischemic ([place])   . Carpal tunnel syndrome    bilateral  . Congestive heart failure  ([place])    normalization of EF with medical therapy  . Dysrhythmia 08/31/2015   long RP tachycardia  . Environmental and seasonal allergies   . Female cystocele   . Hyperlipidemia   . Hypertension   . Hyperthyroidism   . Migraines   . Overactive detrusor   . Personal history of colonic polyps   . PONV (postoperative nausea and vomiting)   . SUI (stress urinary incontinence, female)   . Uterovaginal prolapse   . UTI (urinary tract infection)   . Vaginal atrophy     Tobacco History: Social History   Tobacco Use  Smoking Status Never Smoker  Smokeless Tobacco Never Used   Counseling given: Yes   Outpatient Encounter Medications as of 11/06/2019  Medication Sig  . acetaminophen (TYLENOL) 500 MG tablet Take 500-1,000 mg by mouth every 6 (six) hours as needed for moderate pain or headache.  . ARTIFICIAL TEAR SOLUTION OP Place 1 drop into both eyes daily as needed (dry eyes).  . calcium carbonate (OSCAL) 1500 (600 Ca) MG TABS tablet Take 1,500 mg by mouth daily.   . calcium carbonate (TUMS - DOSED IN MG ELEMENTAL CALCIUM) 500 MG chewable tablet Chew 1 tablet by mouth daily as needed for indigestion or heartburn.  . carvedilol (COREG) 12.5 MG tablet Take 1 tablet (12.5 mg total) by mouth 2 (two) times daily with a meal.  . Cholecalciferol 1000 units capsule Take 1,000 Units by mouth daily.  . diphenhydramine-acetaminophen (TYLENOL PM) 25-500 MG TABS tablet Take 1 tablet by mouth at bedtime  as needed (sleep).   . fexofenadine (ALLEGRA) 180 MG tablet Take 180 mg by mouth at bedtime.   . furosemide (LASIX) 20 MG tablet TAKE 1 TABLET (20 MG TOTAL) BY MOUTH EVERY OTHER DAY.  . methimazole (TAPAZOLE) 10 MG tablet Take 5 mg by mouth daily.  . ondansetron (ZOFRAN ODT) 4 MG disintegrating tablet Take 1 tablet (4 mg total) by mouth every 8 (eight) hours as needed for nausea or vomiting.  . pantoprazole (PROTONIX) 40 MG tablet Take 40 mg by mouth daily.  . promethazine-dextromethorphan  (PROMETHAZINE-DM) 6.25-15 MG/5ML syrup Take 5 mLs by mouth 4 (four) times daily as needed for cough.  . rosuvastatin (CRESTOR) 10 MG tablet TAKE 1 TABLET BY MOUTH EVERY DAY  . spironolactone (ALDACTONE) 25 MG tablet Take 0.5 tablets (12.5 mg total) by mouth daily.  Marland Kitchen triamcinolone (NASACORT ALLERGY 24HR) 55 MCG/ACT AERO nasal inhaler Place 1 spray into the nose daily as needed (allergies).  . zonisamide (ZONEGRAN) 100 MG capsule TAKE 1 CAPSULE BY MOUTH EVERY DAY  . cefdinir (OMNICEF) 300 MG capsule Take 1 capsule (300 mg total) by mouth 2 (two) times daily for 10 days.  Marland Kitchen lisinopril (ZESTRIL) 5 MG tablet Take 1 tablet (5 mg total) by mouth at bedtime. Reported on 11/11/2015 (Patient taking differently: Take 5 mg by mouth at bedtime. )  . SUMAtriptan (IMITREX) 100 MG tablet Take 1 tablet (100 mg total) by mouth once for 1 dose. May repeat in 2 hours if headache persists or recurs.   No facility-administered encounter medications on file as of 11/06/2019.     Review of Systems  Review of Systems  Constitutional: Positive for activity change (decreased), appetite change (decreased) and fatigue. Negative for fever.  Respiratory: Positive for cough. Negative for shortness of breath and wheezing.   Cardiovascular: Negative for chest pain, palpitations and leg swelling.  Genitourinary: Positive for dysuria and frequency.  Psychiatric/Behavioral: Negative for confusion and decreased concentration.  All other systems reviewed and are negative.      Physical Exam  BP 104/72 (BP Location: Left Arm, Patient Position: Sitting)   Pulse 95   Temp (!) 96.9 F (36.1 C)   Ht 5\' 1"  (1.549 m)   Wt 155 lb 8 oz (70.5 kg)   LMP 10/18/2012   SpO2 94%   BMI 29.38 kg/m   Wt Readings from Last 5 Encounters:  11/06/19 155 lb 8 oz (70.5 kg)  11/03/19 159 lb 6 oz (72.3 kg)  10/28/19 160 lb (72.6 kg)  09/01/19 160 lb (72.6 kg)  06/17/19 160 lb 8 oz (72.8 kg)     Physical Exam Vitals and nursing note  reviewed.  Constitutional:      General: She is not in acute distress.    Appearance: She is well-developed. She is not ill-appearing, toxic-appearing or diaphoretic.  Cardiovascular:     Rate and Rhythm: Normal rate and regular rhythm.  Pulmonary:     Effort: Pulmonary effort is normal. No respiratory distress.     Breath sounds: Normal breath sounds. No wheezing or rhonchi.  Abdominal:     General: There is no distension.     Tenderness: There is no abdominal tenderness.     Comments: Decreased bowel sounds.   Musculoskeletal:     Right lower leg: No edema.     Left lower leg: No edema.  Skin:    Comments: Poor skin turgor noted.   Neurological:     Mental Status: She is alert and oriented to person,  place, and time.  Psychiatric:        Mood and Affect: Mood normal.        Behavior: Behavior normal.        Thought Content: Thought content normal.        Judgment: Judgment normal.      Lab Results:  CBC    Component Value Date/Time   WBC 6.9 11/03/2019 2003   WBC 18.5 (H) 06/18/2019 0513   RBC 5.31 (H) 11/03/2019 2003   RBC 4.15 06/18/2019 0513   HGB 13.7 11/03/2019 2003   HCT 43.6 11/03/2019 2003   PLT 261 11/03/2019 2003   MCV 82 11/03/2019 2003   MCH 25.8 (L) 11/03/2019 2003   MCH 28.0 06/18/2019 0513   MCHC 31.4 (L) 11/03/2019 2003   MCHC 32.0 06/18/2019 0513   RDW 15.3 11/03/2019 2003   LYMPHSABS 2.4 11/03/2019 2003   MONOABS 1.1 (H) 06/18/2019 0513   EOSABS 0.0 11/03/2019 2003   BASOSABS 0.0 11/03/2019 2003    BMET    Component Value Date/Time   NA 141 11/03/2019 2003   K 5.4 (H) 11/03/2019 2003   CL 105 11/03/2019 2003   CO2 23 11/03/2019 2003   GLUCOSE 103 (H) 11/03/2019 2003   GLUCOSE 96 08/28/2019 0806   BUN 21 11/03/2019 2003   CREATININE 1.21 (H) 11/03/2019 2003   CREATININE 0.77 07/03/2014 1653   CALCIUM 9.2 11/03/2019 2003   GFRNONAA 47 (L) 11/03/2019 2003   GFRAA 54 (L) 11/03/2019 2003     Assessment & Plan:   COVID-19 virus  infection 1. COVID-19 -My chart home companion ordered at last visit -High risk for complications, requires close follow-up and monitoring. Requesting to follow up with PCP due to long drive here - sent message to PCP. -Return to clinic or PCP in 1 week for recheck labs and blood pressure. - Temperature monitoring  2. Acute Urinary Tract Infection, high risk for urosepsis  -UA abnormal  -Urine Culture pending  -Continue Omnicef 300 mg twice daily x 10 days  3. Nausea -Zofran PRN  4. Fever in other diseases -likely multifactorial COVID and UTI -Continue Tylenol for fever management - Temperature monitoring  5. Hypotension, unspecified hypotension  -Likely secondary to fever, acute illness and hypovolemia  -Check BP twice daily and prior to evening dose of lisinopril -Hold lisinopril for any BP <110/80 -Hydrate with water and avoid caffeine beverage due to these can cause dehydration. -Considering CHF, continue Coreg and Furosemide -Parameters written on discharge summary and discussed with patient that warrant immediate evaluation in ER     Patient Instructions  Glad to hear that you are feeling a little improved.....   Patient discussion/instructions:  Labs checked at last visit. Renal function labs mildly elevated and this is in relation to needing to improve hydration status.  Continue to drink plenty of fluids  Make sure to eat throughout the day - may try soups and dry cereals - try eating 6 small meals/snacks per day  Check your blood pressure before taking lisinopril.  Hold Lisinopril 5 mg for any blood pressure readings lower than 110/80.  If blood pressure drops below 90/50 -if the top or bottom number drops lower than these parameters- go immediately to the Emergency Department.   Hypotension As your heart beats, it forces blood through your body. This force is called blood pressure. If you have hypotension, you have low blood pressure. When your  blood pressure is too low, you may not get enough blood  to your brain or other parts of your body. This may cause you to feel weak, light-headed, have a fast heartbeat, or even pass out (faint). Low blood pressure may be harmless, or it may cause serious problems. What are the causes?  Blood loss.  Not enough water in the body (dehydration).  Heart problems.  Hormone problems.  Pregnancy.  A very bad infection.  Not having enough of certain nutrients.  Very bad allergic reactions.  Certain medicines. What increases the risk?  Age. The risk increases as you get older.  Conditions that affect the heart or the brain and spinal cord (central nervous system).  Taking certain medicines.  Being pregnant. What are the signs or symptoms?  Feeling: ? Weak. ? Light-headed. ? Dizzy. ? Tired (fatigued).  Blurred vision.  Fast heartbeat.  Passing out, in very bad cases. How is this treated?  Changing your diet. This may involve eating more salt (sodium) or drinking more water.  Taking medicines to raise your blood pressure.  Changing how much you take (the dosage) of some of your medicines.  Wearing compression stockings. These stockings help to prevent blood clots and reduce swelling in your legs. In some cases, you may need to go to the hospital for:  Fluid replacement. This means you will receive fluids through an IV tube.  Blood replacement. This means you will receive donated blood through an IV tube (transfusion).  Treating an infection or heart problems, if this applies.  Monitoring. You may need to be monitored while medicines that you are taking wear off. Follow these instructions at home: Eating and drinking   Drink enough fluids to keep your pee (urine) pale yellow.  Eat a healthy diet. Follow instructions from your doctor about what you can eat or drink. A healthy diet includes: ? Fresh fruits and vegetables. ? Whole grains. ? Low-fat (lean)  meats. ? Low-fat dairy products.  Eat extra salt only as told. Do not add extra salt to your diet unless your doctor tells you to.  Eat small meals often.  Avoid standing up quickly after you eat. Medicines  Take over-the-counter and prescription medicines only as told by your doctor. ? Follow instructions from your doctor about changing how much you take of your medicines, if this applies. ? Do not stop or change any of your medicines on your own. General instructions   Wear compression stockings as told by your doctor.  Get up slowly from lying down or sitting.  Avoid hot showers and a lot of heat as told by your doctor.  Return to your normal activities as told by your doctor. Ask what activities are safe for you.  Do not use any products that contain nicotine or tobacco, such as cigarettes, e-cigarettes, and chewing tobacco. If you need help quitting, ask your doctor.  Keep all follow-up visits as told by your doctor. This is important. Contact a doctor if:  You throw up (vomit).  You have watery poop (diarrhea).  You have a fever for more than 2-3 days.  You feel more thirsty than normal.  You feel weak and tired. Get help right away if:  You have chest pain.  You have a fast or uneven heartbeat.  You lose feeling (have numbness) in any part of your body.  You cannot move your arms or your legs.  You have trouble talking.  You get sweaty or feel light-headed.  You pass out.  You have trouble breathing.  You have trouble staying  awake.  You feel mixed up (confused). Summary  Hypotension is also called low blood pressure. It is when the force of blood pumping through your arteries is too weak.  Hypotension may be harmless, or it may cause serious problems.  Treatment may include changing your diet and medicines, and wearing compression stockings.  In very bad cases, you may need to go to the hospital. This information is not intended to replace  advice given to you by your health care provider. Make sure you discuss any questions you have with your health care provider. Document Revised: 02/07/2018 Document Reviewed: 02/07/2018 Elsevier Patient Education  [place].  Dehydration, Adult Dehydration is condition in which there is not enough water or other fluids in the body. This happens when a person loses more fluids than he or she takes in. Important body parts cannot work right without the right amount of fluids. Any loss of fluids from the body can cause dehydration. Dehydration can be mild, worse, or very bad. It should be treated right away to keep it from getting very bad. What are the causes? This condition may be caused by:  Conditions that cause loss of water or other fluids, such as: ? Watery poop (diarrhea). ? Vomiting. ? Sweating a lot. ? Peeing (urinating) a lot.  Not drinking enough fluids, especially when you: ? Are ill. ? Are doing things that take a lot of energy to do.  Other illnesses and conditions, such as fever or infection.  Certain medicines, such as medicines that take extra fluid out of the body (diuretics).  Lack of safe drinking water.  Not being able to get enough water and food. What increases the risk? The following factors may make you more likely to develop this condition:  Having a long-term (chronic) illness that has not been treated the right way, such as: ? Diabetes. ? Heart disease. ? Kidney disease.  Being 34 years of age or older.  Having a disability.  Living in a place that is high above the ground or sea (high in altitude). The thinner, dried air causes more fluid loss.  Doing exercises that put stress on your body for a long time. What are the signs or symptoms? Symptoms of dehydration depend on how bad it is. Mild or worse dehydration  Thirst.  Dry lips or dry mouth.  Feeling dizzy or light-headed, especially when you stand up from sitting.  Muscle  cramps.  Your body making: ? Dark pee (urine). Pee may be the color of tea. ? Less pee than normal. ? Less tears than normal.  Headache. Very bad dehydration  Changes in skin. Skin may: ? Be cold to the touch (clammy). ? Be blotchy or pale. ? Not go back to normal right after you lightly pinch it and let it go.  Little or no tears, pee, or sweat.  Changes in vital signs, such as: ? Fast breathing. ? Low blood pressure. ? Weak pulse. ? Pulse that is more than 100 beats a minute when you are sitting still.  Other changes, such as: ? Feeling very thirsty. ? Eyes that look hollow (sunken). ? Cold hands and feet. ? Being mixed up (confused). ? Being very tired (lethargic) or having trouble waking from sleep. ? Short-term weight loss. ? Loss of consciousness. How is this treated? Treatment for this condition depends on how bad it is. Treatment should start right away. Do not wait until your condition gets very bad. Very bad dehydration is an  emergency. You will need to go to a hospital.  Mild or worse dehydration can be treated at home. You may be asked to: ? Drink more fluids. ? Drink an oral rehydration solution (ORS). This drink helps get the right amounts of fluids and salts and minerals in the blood (electrolytes).  Very bad dehydration can be treated: ? With fluids through an IV tube. ? By getting normal levels of salts and minerals in your blood. This is often done by giving salts and minerals through a tube. The tube is passed through your nose and into your stomach. ? By treating the root cause. Follow these instructions at home: Oral rehydration solution If told by your doctor, drink an ORS:  Make an ORS. Use instructions on the package.  Start by drinking small amounts, about  cup (120 mL) every 5-10 minutes.  Slowly drink more until you have had the amount that your doctor said to have. Eating and drinking         Drink enough clear fluid to keep your  pee pale yellow. If you were told to drink an ORS, finish the ORS first. Then, start slowly drinking other clear fluids. Drink fluids such as: ? Water. Do not drink only water. Doing that can make the salt (sodium) level in your body get too low. ? Water from ice chips you suck on. ? Fruit juice that you have added water to (diluted). ? Low-calorie sports drinks.  Eat foods that have the right amounts of salts and minerals, such as: ? Bananas. ? Oranges. ? Potatoes. ? Tomatoes. ? Spinach.  Do not drink alcohol.  Avoid: ? Drinks that have a lot of sugar. These include:  High-calorie sports drinks.  Fruit juice that you did not add water to.  Soda.  Caffeine. ? Foods that are greasy or have a lot of fat or sugar. General instructions  Take over-the-counter and prescription medicines only as told by your doctor.  Do not take salt tablets. Doing that can make the salt level in your body get too high.  Return to your normal activities as told by your doctor. Ask your doctor what activities are safe for you.  Keep all follow-up visits as told by your doctor. This is important. Contact a doctor if:  You have pain in your belly (abdomen) and the pain: ? Gets worse. ? Stays in one place.  You have a rash.  You have a stiff neck.  You get angry or annoyed (irritable) more easily than normal.  You are more tired or have a harder time waking than normal.  You feel: ? Weak or dizzy. ? Very thirsty. Get help right away if you have:  Any symptoms of very bad dehydration.  Symptoms of vomiting, such as: ? You cannot eat or drink without vomiting. ? Your vomiting gets worse or does not go away. ? Your vomit has blood or green stuff in it.  Symptoms that get worse with treatment.  A fever.  A very bad headache.  Problems with peeing or pooping (having a bowel movement), such as: ? Watery poop that gets worse or does not go away. ? Blood in your poop (stool). This  may cause poop to look black and tarry. ? Not peeing in 6-8 hours. ? Peeing only a small amount of very dark pee in 6-8 hours.  Trouble breathing. These symptoms may be an emergency. Do not wait to see if the symptoms will go away. Get medical help  right away. Call your local emergency services (911 in the [place] Do not drive yourself to the hospital. Summary  Dehydration is a condition in which there is not enough water or other fluids in the body. This happens when a person loses more fluids than he or she takes in.  Treatment for this condition depends on how bad it is. Treatment should be started right away. Do not wait until your condition gets very bad.  Drink enough clear fluid to keep your pee pale yellow. If you were told to drink an oral rehydration solution (ORS), finish the ORS first. Then, start slowly drinking other clear fluids.  Take over-the-counter and prescription medicines only as told by your doctor.  Get help right away if you have any symptoms of very bad dehydration. This information is not intended to replace advice given to you by your health care provider. Make sure you discuss any questions you have with your health care provider. Document Revised: 03/27/2019 Document Reviewed: 03/27/2019 Elsevier Patient Education  2020 [place], [place] 11/06/2019

## 2019-11-06 NOTE — Telephone Encounter (Signed)
Left a message about setting the patients surgery rescheduled. Requested a return call when she is ready to schedule.

## 2019-11-06 NOTE — Patient Instructions (Addendum)
Glad to hear that you are feeling a little improved.....   Patient discussion/instructions:  Labs checked at last visit. Renal function labs mildly elevated and this is in relation to needing to improve hydration status.  Continue to drink plenty of fluids  Make sure to eat throughout the day - may try soups and dry cereals - try eating 6 small meals/snacks per day  Check your blood pressure before taking lisinopril.  Hold Lisinopril 5 mg for any blood pressure readings lower than 110/80.  If blood pressure drops below 90/50 -if the top or bottom number drops lower than these parameters- go immediately to the Emergency Department.   Hypotension As your heart beats, it forces blood through your body. This force is called blood pressure. If you have hypotension, you have low blood pressure. When your blood pressure is too low, you may not get enough blood to your brain or other parts of your body. This may cause you to feel weak, light-headed, have a fast heartbeat, or even pass out (faint). Low blood pressure may be harmless, or it may cause serious problems. What are the causes?  Blood loss.  Not enough water in the body (dehydration).  Heart problems.  Hormone problems.  Pregnancy.  A very bad infection.  Not having enough of certain nutrients.  Very bad allergic reactions.  Certain medicines. What increases the risk?  Age. The risk increases as you get older.  Conditions that affect the heart or the brain and spinal cord (central nervous system).  Taking certain medicines.  Being pregnant. What are the signs or symptoms?  Feeling: ? Weak. ? Light-headed. ? Dizzy. ? Tired (fatigued).  Blurred vision.  Fast heartbeat.  Passing out, in very bad cases. How is this treated?  Changing your diet. This may involve eating more salt (sodium) or drinking more water.  Taking medicines to raise your blood pressure.  Changing how much you take (the dosage) of  some of your medicines.  Wearing compression stockings. These stockings help to prevent blood clots and reduce swelling in your legs. In some cases, you may need to go to the hospital for:  Fluid replacement. This means you will receive fluids through an IV tube.  Blood replacement. This means you will receive donated blood through an IV tube (transfusion).  Treating an infection or heart problems, if this applies.  Monitoring. You may need to be monitored while medicines that you are taking wear off. Follow these instructions at home: Eating and drinking   Drink enough fluids to keep your pee (urine) pale yellow.  Eat a healthy diet. Follow instructions from your doctor about what you can eat or drink. A healthy diet includes: ? Fresh fruits and vegetables. ? Whole grains. ? Low-fat (lean) meats. ? Low-fat dairy products.  Eat extra salt only as told. Do not add extra salt to your diet unless your doctor tells you to.  Eat small meals often.  Avoid standing up quickly after you eat. Medicines  Take over-the-counter and prescription medicines only as told by your doctor. ? Follow instructions from your doctor about changing how much you take of your medicines, if this applies. ? Do not stop or change any of your medicines on your own. General instructions   Wear compression stockings as told by your doctor.  Get up slowly from lying down or sitting.  Avoid hot showers and a lot of heat as told by your doctor.  Return to your normal activities as told  by your doctor. Ask what activities are safe for you.  Do not use any products that contain nicotine or tobacco, such as cigarettes, e-cigarettes, and chewing tobacco. If you need help quitting, ask your doctor.  Keep all follow-up visits as told by your doctor. This is important. Contact a doctor if:  You throw up (vomit).  You have watery poop (diarrhea).  You have a fever for more than 2-3 days.  You feel more  thirsty than normal.  You feel weak and tired. Get help right away if:  You have chest pain.  You have a fast or uneven heartbeat.  You lose feeling (have numbness) in any part of your body.  You cannot move your arms or your legs.  You have trouble talking.  You get sweaty or feel light-headed.  You pass out.  You have trouble breathing.  You have trouble staying awake.  You feel mixed up (confused). Summary  Hypotension is also called low blood pressure. It is when the force of blood pumping through your arteries is too weak.  Hypotension may be harmless, or it may cause serious problems.  Treatment may include changing your diet and medicines, and wearing compression stockings.  In very bad cases, you may need to go to the hospital. This information is not intended to replace advice given to you by your health care provider. Make sure you discuss any questions you have with your health care provider. Document Revised: 02/07/2018 Document Reviewed: 02/07/2018 Elsevier Patient Education  [place].  Dehydration, Adult Dehydration is condition in which there is not enough water or other fluids in the body. This happens when a person loses more fluids than he or she takes in. Important body parts cannot work right without the right amount of fluids. Any loss of fluids from the body can cause dehydration. Dehydration can be mild, worse, or very bad. It should be treated right away to keep it from getting very bad. What are the causes? This condition may be caused by:  Conditions that cause loss of water or other fluids, such as: ? Watery poop (diarrhea). ? Vomiting. ? Sweating a lot. ? Peeing (urinating) a lot.  Not drinking enough fluids, especially when you: ? Are ill. ? Are doing things that take a lot of energy to do.  Other illnesses and conditions, such as fever or infection.  Certain medicines, such as medicines that take extra fluid out of the  body (diuretics).  Lack of safe drinking water.  Not being able to get enough water and food. What increases the risk? The following factors may make you more likely to develop this condition:  Having a long-term (chronic) illness that has not been treated the right way, such as: ? Diabetes. ? Heart disease. ? Kidney disease.  Being 52 years of age or older.  Having a disability.  Living in a place that is high above the ground or sea (high in altitude). The thinner, dried air causes more fluid loss.  Doing exercises that put stress on your body for a long time. What are the signs or symptoms? Symptoms of dehydration depend on how bad it is. Mild or worse dehydration  Thirst.  Dry lips or dry mouth.  Feeling dizzy or light-headed, especially when you stand up from sitting.  Muscle cramps.  Your body making: ? Dark pee (urine). Pee may be the color of tea. ? Less pee than normal. ? Less tears than normal.  Headache. Very bad  dehydration  Changes in skin. Skin may: ? Be cold to the touch (clammy). ? Be blotchy or pale. ? Not go back to normal right after you lightly pinch it and let it go.  Little or no tears, pee, or sweat.  Changes in vital signs, such as: ? Fast breathing. ? Low blood pressure. ? Weak pulse. ? Pulse that is more than 100 beats a minute when you are sitting still.  Other changes, such as: ? Feeling very thirsty. ? Eyes that look hollow (sunken). ? Cold hands and feet. ? Being mixed up (confused). ? Being very tired (lethargic) or having trouble waking from sleep. ? Short-term weight loss. ? Loss of consciousness. How is this treated? Treatment for this condition depends on how bad it is. Treatment should start right away. Do not wait until your condition gets very bad. Very bad dehydration is an emergency. You will need to go to a hospital.  Mild or worse dehydration can be treated at home. You may be asked to: ? Drink more  fluids. ? Drink an oral rehydration solution (ORS). This drink helps get the right amounts of fluids and salts and minerals in the blood (electrolytes).  Very bad dehydration can be treated: ? With fluids through an IV tube. ? By getting normal levels of salts and minerals in your blood. This is often done by giving salts and minerals through a tube. The tube is passed through your nose and into your stomach. ? By treating the root cause. Follow these instructions at home: Oral rehydration solution If told by your doctor, drink an ORS:  Make an ORS. Use instructions on the package.  Start by drinking small amounts, about  cup (120 mL) every 5-10 minutes.  Slowly drink more until you have had the amount that your doctor said to have. Eating and drinking         Drink enough clear fluid to keep your pee pale yellow. If you were told to drink an ORS, finish the ORS first. Then, start slowly drinking other clear fluids. Drink fluids such as: ? Water. Do not drink only water. Doing that can make the salt (sodium) level in your body get too low. ? Water from ice chips you suck on. ? Fruit juice that you have added water to (diluted). ? Low-calorie sports drinks.  Eat foods that have the right amounts of salts and minerals, such as: ? Bananas. ? Oranges. ? Potatoes. ? Tomatoes. ? Spinach.  Do not drink alcohol.  Avoid: ? Drinks that have a lot of sugar. These include:  High-calorie sports drinks.  Fruit juice that you did not add water to.  Soda.  Caffeine. ? Foods that are greasy or have a lot of fat or sugar. General instructions  Take over-the-counter and prescription medicines only as told by your doctor.  Do not take salt tablets. Doing that can make the salt level in your body get too high.  Return to your normal activities as told by your doctor. Ask your doctor what activities are safe for you.  Keep all follow-up visits as told by your doctor. This is  important. Contact a doctor if:  You have pain in your belly (abdomen) and the pain: ? Gets worse. ? Stays in one place.  You have a rash.  You have a stiff neck.  You get angry or annoyed (irritable) more easily than normal.  You are more tired or have a harder time waking than normal.  You feel: ? Weak or dizzy. ? Very thirsty. Get help right away if you have:  Any symptoms of very bad dehydration.  Symptoms of vomiting, such as: ? You cannot eat or drink without vomiting. ? Your vomiting gets worse or does not go away. ? Your vomit has blood or green stuff in it.  Symptoms that get worse with treatment.  A fever.  A very bad headache.  Problems with peeing or pooping (having a bowel movement), such as: ? Watery poop that gets worse or does not go away. ? Blood in your poop (stool). This may cause poop to look black and tarry. ? Not peeing in 6-8 hours. ? Peeing only a small amount of very dark pee in 6-8 hours.  Trouble breathing. These symptoms may be an emergency. Do not wait to see if the symptoms will go away. Get medical help right away. Call your local emergency services (911 in the [place] Do not drive yourself to the hospital. Summary  Dehydration is a condition in which there is not enough water or other fluids in the body. This happens when a person loses more fluids than he or she takes in.  Treatment for this condition depends on how bad it is. Treatment should be started right away. Do not wait until your condition gets very bad.  Drink enough clear fluid to keep your pee pale yellow. If you were told to drink an oral rehydration solution (ORS), finish the ORS first. Then, start slowly drinking other clear fluids.  Take over-the-counter and prescription medicines only as told by your doctor.  Get help right away if you have any symptoms of very bad dehydration. This information is not intended to replace advice given to you by your health care  provider. Make sure you discuss any questions you have with your health care provider. Document Revised: 03/27/2019 Document Reviewed: 03/27/2019 Elsevier Patient Education  [place].

## 2019-11-06 NOTE — Telephone Encounter (Signed)
Made in error

## 2019-11-06 NOTE — Assessment & Plan Note (Signed)
1. COVID-19 -My chart home companion ordered at last visit -High risk for complications, requires close follow-up and monitoring. Requesting to follow up with PCP due to long drive here - sent message to PCP. -Return to clinic or PCP in 1 week for recheck labs and blood pressure. - Temperature monitoring  2. Acute Urinary Tract Infection, high risk for urosepsis  -UA abnormal  -Urine Culture pending  -Continue Omnicef 300 mg twice daily x 10 days  3. Nausea -Zofran PRN  4. Fever in other diseases -likely multifactorial COVID and UTI -Continue Tylenol for fever management - Temperature monitoring  5. Hypotension, unspecified hypotension  -Likely secondary to fever, acute illness and hypovolemia  -Check BP twice daily and prior to evening dose of lisinopril -Hold lisinopril for any BP <110/80 -Hydrate with water and avoid caffeine beverage due to these can cause dehydration. -Considering CHF, continue Coreg and Furosemide -Parameters written on discharge summary and discussed with patient that warrant immediate evaluation in ER

## 2019-11-08 LAB — CBC WITH DIFFERENTIAL/PLATELET
Basophils Absolute: 0 10*3/uL (ref 0.0–0.2)
Basos: 0 %
EOS (ABSOLUTE): 0 10*3/uL (ref 0.0–0.4)
Eos: 0 %
Hematocrit: 43.6 % (ref 34.0–46.6)
Hemoglobin: 13.7 g/dL (ref 11.1–15.9)
Immature Grans (Abs): 0 10*3/uL (ref 0.0–0.1)
Immature Granulocytes: 0 %
Lymphocytes Absolute: 2.4 10*3/uL (ref 0.7–3.1)
Lymphs: 35 %
MCH: 25.8 pg — ABNORMAL LOW (ref 26.6–33.0)
MCHC: 31.4 g/dL — ABNORMAL LOW (ref 31.5–35.7)
MCV: 82 fL (ref 79–97)
Monocytes Absolute: 0.4 10*3/uL (ref 0.1–0.9)
Monocytes: 6 %
Neutrophils Absolute: 4 10*3/uL (ref 1.4–7.0)
Neutrophils: 59 %
Platelets: 261 10*3/uL (ref 150–450)
RBC: 5.31 x10E6/uL — ABNORMAL HIGH (ref 3.77–5.28)
RDW: 15.3 % (ref 11.7–15.4)
WBC: 6.9 10*3/uL (ref 3.4–10.8)

## 2019-11-08 LAB — COMPREHENSIVE METABOLIC PANEL
ALT: 11 IU/L (ref 0–32)
AST: 20 IU/L (ref 0–40)
Albumin/Globulin Ratio: 1.8 (ref 1.2–2.2)
Albumin: 4.4 g/dL (ref 3.8–4.8)
Alkaline Phosphatase: 120 IU/L — ABNORMAL HIGH (ref 39–117)
BUN/Creatinine Ratio: 17 (ref 12–28)
BUN: 21 mg/dL (ref 8–27)
Bilirubin Total: <0.2 mg/dL (ref 0.0–1.2)
CO2: 23 mmol/L (ref 20–29)
Calcium: 9.2 mg/dL (ref 8.7–10.3)
Chloride: 105 mmol/L (ref 96–106)
Creatinine, Ser: 1.21 mg/dL — ABNORMAL HIGH (ref 0.57–1.00)
GFR calc Af Amer: 54 mL/min/{1.73_m2} — ABNORMAL LOW (ref >59)
GFR calc non Af Amer: 47 mL/min/{1.73_m2} — ABNORMAL LOW (ref >59)
Globulin, Total: 2.5 g/dL (ref 1.5–4.5)
Glucose: 103 mg/dL — ABNORMAL HIGH (ref 65–99)
Potassium: 5.4 mmol/L — ABNORMAL HIGH (ref 3.5–5.2)
Sodium: 141 mmol/L (ref 134–144)
Total Protein: 6.9 g/dL (ref 6.0–8.5)

## 2019-11-08 LAB — CULTURE, URINE COMPREHENSIVE

## 2019-11-10 ENCOUNTER — Encounter: Payer: Self-pay | Admitting: Internal Medicine

## 2019-11-11 ENCOUNTER — Telehealth: Payer: Self-pay | Admitting: Internal Medicine

## 2019-11-11 NOTE — Telephone Encounter (Signed)
See phone note. Confirmed ok. No fever, nausea, vomiting, etc. Going to sch f/u with resp clinic

## 2019-11-11 NOTE — Telephone Encounter (Signed)
Received notification from respiratory clinic regarding pt.  Needs f/u this week.  Please call and confirm how pt is doing.  See me before calling.

## 2019-11-11 NOTE — Telephone Encounter (Signed)
The low grade fever may still be from covid.  I assume she is not having any urinary symptoms.  Confirm she is eating.  No vomiting or diarrhea.  Temp is better.  Blood pressure appears to be some better.  Confirm with pt she is feeling better.  Recommend stay in quarantine until fever free on no antipyretics.  Also, agree with f/u in respiratory clinic.

## 2019-11-11 NOTE — Telephone Encounter (Signed)
Will f/u with resp clinic

## 2019-11-11 NOTE — Telephone Encounter (Signed)
Advised pt that she needs f/u with resp clinic. She is going to call them to schedule. Wanted to note that she did not take her lisinopril last night and bp was 128/68 and no fever. Starting to feel better. Overall doing ok.

## 2019-11-11 NOTE — Telephone Encounter (Signed)
Noted.  Do still recommend f/u in respiratory clinic.

## 2019-11-12 ENCOUNTER — Telehealth: Payer: Self-pay | Admitting: Cardiovascular Disease

## 2019-11-12 NOTE — Telephone Encounter (Signed)
Pt c/o BP issue: STAT if pt c/o blurred vision, one-sided weakness or slurred speech  1. What are your last 5 BP readings?  11/12/19 88/52 95/65  100/70  2. Are you having any other symptoms (ex. Dizziness, headache, blurred vision, passed out)? headaches  3. What is your BP issue? Patient had been holding lisinopril due to COVID and BP getting low.  Patient started back on lisinopril last night and has had low readings today.  Would like to know if she should make any changes to medications.  Please call to discuss.

## 2019-11-12 NOTE — Telephone Encounter (Signed)
Patient was at the respiratory clinic in [place] last week and blood pressures were running low. They advised for her to hold lisinopril if blood pressures were 110/90 or lower. She is 15 days out from COVID diagnosis but is hoarse with cough and reports that she is weak and somewhat dehydrated. Recommended that she hold the lisinopril and monitor blood pressures for now and if she sees them trend back up then to please give [place] a call and we can see if restarting that is needed. She verbalized understanding with no further questions at this time.

## 2019-11-13 ENCOUNTER — Ambulatory Visit
Admission: RE | Admit: 2019-11-13 | Discharge: 2019-11-13 | Disposition: A | Discharge status: Home / Self Care | Payer: Medicare Other | Source: Ambulatory Visit | Attending: Internal Medicine | Admitting: Internal Medicine

## 2019-11-13 ENCOUNTER — Ambulatory Visit (INDEPENDENT_AMBULATORY_CARE_PROVIDER_SITE_OTHER): Payer: Medicare Other | Admitting: Nurse Practitioner

## 2019-11-13 ENCOUNTER — Other Ambulatory Visit: Payer: Self-pay

## 2019-11-13 VITALS — BP 118/68 | HR 75 | Temp 95.0°F | Ht 61.0 in | Wt 157.5 lb

## 2019-11-13 DIAGNOSIS — Z1231 Encounter for screening mammogram for malignant neoplasm of breast: Secondary | ICD-10-CM | POA: Insufficient documentation

## 2019-11-13 DIAGNOSIS — R059 Cough, unspecified: Secondary | ICD-10-CM | POA: Insufficient documentation

## 2019-11-13 DIAGNOSIS — N390 Urinary tract infection, site not specified: Secondary | ICD-10-CM

## 2019-11-13 DIAGNOSIS — R05 Cough: Secondary | ICD-10-CM

## 2019-11-13 DIAGNOSIS — Z8616 Personal history of COVID-19: Secondary | ICD-10-CM | POA: Diagnosis not present

## 2019-11-13 DIAGNOSIS — I959 Hypotension, unspecified: Secondary | ICD-10-CM | POA: Diagnosis not present

## 2019-11-13 MED ORDER — BENZONATATE 100 MG PO CAPS: 100.0000 mg | ORAL_CAPSULE | Freq: Two times a day (BID) | ORAL | 0 refills | Status: DC | PRN

## 2019-11-13 MED ORDER — FLUCONAZOLE 150 MG PO TABS: 150.0000 mg | ORAL_TABLET | Freq: Once | ORAL | 0 refills | Status: AC

## 2019-11-13 NOTE — Progress Notes (Signed)
@Patient  ID: Kristi Greer, female    DOB: [birth year], 69 y.o.   MRN: [id number]  Chief Complaint  Patient presents with  . 2 Week Follow-up    Coughing a bit more, BP still dropping.    Referring provider: Einar Pheasant, MD   69 year old female with SVT, CHF, GERD, hyperthyroidism, and recently diagnosed with Covid on 10/27/19.   Recent Significant Encounters:   11/03/19 Respiratory clinic visit: Patient was seen for fever and hypotension. Advised to hydrate and hold lisinopril. UA positive for UTI - culture sent. Started on omnicef. Labs overall stable. Tylenol for fever.  11/06/19 [place]: follow up hypotension - patient stable - continue to hold lisinopril according to parameters discussed and complete omnicef.  HPI  Patient presents today for follow up on hypotension and recent UTI. Patient states that she is much improved. Vital signs stable in office today. She continues to hold lisinopril. She is checking blood pressures, temperature, and weights daily. She has one more day of omnicef left. She does complain today of yeast infection associated with antibiotic use. She also complains of cough. Cough is non productive. Denies f/c/s, n/v/d, hemoptysis, PND, denies chest pain or edema.       No Known Allergies  Immunization History  Administered Date(s) Administered  . Fluad Quad(high Dose 65+) 04/28/2019  . Influenza Split 05/27/2012, 05/26/2013, 05/15/2014  . Influenza, High Dose Seasonal PF 05/01/2018  . Influenza-Unspecified 05/26/2015  . Pneumococcal Conjugate-13 05/13/2019    Past Medical History:  Diagnosis Date  . Allergy   . Atrial fibrillation ([place])   . Cancer ([place])    basal cell  . Cardiomyopathy, nonischemic ([place])   . Carpal tunnel syndrome    bilateral  . Congestive heart failure ([place])    normalization of EF with medical therapy  . Dysrhythmia 08/31/2015   long RP tachycardia  . Environmental and seasonal allergies   . Female  cystocele   . Hyperlipidemia   . Hypertension   . Hyperthyroidism   . Migraines   . Overactive detrusor   . Personal history of colonic polyps   . PONV (postoperative nausea and vomiting)   . SUI (stress urinary incontinence, female)   . Uterovaginal prolapse   . UTI (urinary tract infection)   . Vaginal atrophy     Tobacco History: Social History   Tobacco Use  Smoking Status Never Smoker  Smokeless Tobacco Never Used   Counseling given: Not Answered   Outpatient Encounter Medications as of 11/13/2019  Medication Sig  . acetaminophen (TYLENOL) 500 MG tablet Take 500-1,000 mg by mouth every 6 (six) hours as needed for moderate pain or headache.  . ARTIFICIAL TEAR SOLUTION OP Place 1 drop into both eyes daily as needed (dry eyes).  . calcium carbonate (OSCAL) 1500 (600 Ca) MG TABS tablet Take 1,500 mg by mouth daily.   . calcium carbonate (TUMS - DOSED IN MG ELEMENTAL CALCIUM) 500 MG chewable tablet Chew 1 tablet by mouth daily as needed for indigestion or heartburn.  . carvedilol (COREG) 12.5 MG tablet Take 1 tablet (12.5 mg total) by mouth 2 (two) times daily with a meal.  . cefdinir (OMNICEF) 300 MG capsule Take 1 capsule (300 mg total) by mouth 2 (two) times daily for 10 days.  . Cholecalciferol 1000 units capsule Take 1,000 Units by mouth daily.  . diphenhydramine-acetaminophen (TYLENOL PM) 25-500 MG TABS tablet Take 1 tablet by mouth at bedtime as needed (sleep).   . fexofenadine (  ALLEGRA) 180 MG tablet Take 180 mg by mouth at bedtime.   . furosemide (LASIX) 20 MG tablet TAKE 1 TABLET (20 MG TOTAL) BY MOUTH EVERY OTHER DAY.  Marland Kitchen lisinopril (ZESTRIL) 5 MG tablet Take 1 tablet (5 mg total) by mouth at bedtime. Reported on 11/11/2015 (Patient taking differently: Take 5 mg by mouth at bedtime. )  . methimazole (TAPAZOLE) 10 MG tablet Take 5 mg by mouth daily.  . ondansetron (ZOFRAN ODT) 4 MG disintegrating tablet Take 1 tablet (4 mg total) by mouth every 8 (eight) hours as needed  for nausea or vomiting.  . pantoprazole (PROTONIX) 40 MG tablet Take 40 mg by mouth daily.  . promethazine-dextromethorphan (PROMETHAZINE-DM) 6.25-15 MG/5ML syrup Take 5 mLs by mouth 4 (four) times daily as needed for cough.  . rosuvastatin (CRESTOR) 10 MG tablet TAKE 1 TABLET BY MOUTH EVERY DAY  . spironolactone (ALDACTONE) 25 MG tablet Take 0.5 tablets (12.5 mg total) by mouth daily.  Marland Kitchen triamcinolone (NASACORT ALLERGY 24HR) 55 MCG/ACT AERO nasal inhaler Place 1 spray into the nose daily as needed (allergies).  . zonisamide (ZONEGRAN) 100 MG capsule TAKE 1 CAPSULE BY MOUTH EVERY DAY  . benzonatate (TESSALON) 100 MG capsule Take 1 capsule (100 mg total) by mouth 2 (two) times daily as needed for cough.  . fluconazole (DIFLUCAN) 150 MG tablet Take 1 tablet (150 mg total) by mouth once for 1 dose.  . SUMAtriptan (IMITREX) 100 MG tablet Take 1 tablet (100 mg total) by mouth once for 1 dose. May repeat in 2 hours if headache persists or recurs.   No facility-administered encounter medications on file as of 11/13/2019.     Review of Systems  Review of Systems  Constitutional: Negative.  Negative for activity change, chills, fatigue, fever and unexpected weight change.  HENT: Negative.   Respiratory: Positive for cough. Negative for shortness of breath and wheezing.   Cardiovascular: Negative.  Negative for chest pain, palpitations and leg swelling.  Gastrointestinal: Negative.   Allergic/Immunologic: Negative.   Neurological: Negative.   Psychiatric/Behavioral: Negative.        Physical Exam  BP 118/68 (BP Location: Left Arm, Patient Position: Sitting)   Pulse 75   Temp (!) 95 F (35 C)   Ht 5\' 1"  (1.549 m)   Wt 157 lb 8 oz (71.4 kg)   LMP 10/18/2012   SpO2 98%   BMI 29.76 kg/m   Wt Readings from Last 5 Encounters:  11/13/19 157 lb 8 oz (71.4 kg)  11/06/19 155 lb 8 oz (70.5 kg)  11/03/19 159 lb 6 oz (72.3 kg)  10/28/19 160 lb (72.6 kg)  09/01/19 160 lb (72.6 kg)      Physical Exam Vitals and nursing note reviewed.  Constitutional:      General: She is not in acute distress.    Appearance: She is well-developed.  Cardiovascular:     Rate and Rhythm: Normal rate and regular rhythm.  Pulmonary:     Effort: Pulmonary effort is normal. No respiratory distress.     Breath sounds: Normal breath sounds. No wheezing or rhonchi.  Musculoskeletal:     Right lower leg: No edema.     Left lower leg: No edema.  Neurological:     Mental Status: She is alert and oriented to person, place, and time.  Psychiatric:        Mood and Affect: Mood normal.        Behavior: Behavior normal.        Thought  Content: Thought content normal.       Assessment & Plan:   History of COVID-19 1. COVID-19 - much improved since last visit  2.Acute Urinary Tract Infection, high risk for urosepsis  -Will complete Omnicef tomorrow - will order Diflucan for yeast - will recheck labs/kidney function -please follow up with PCP in 2 weeks  3. Nausea -Zofran PRN  4. Fever in other diseases - much improved - fever free since 11/09/19 -likely multifactorial COVID and UTI -Continue Tylenol for fever management - if needed - Temperature monitoring  5. Hypotension, unspecified hypotension -Likely secondary to fever, acute illness and hypovolemia  -Check BP twice daily and prior to evening dose of lisinopril -Hold lisinopril for any BP <110/80 -Hydrate with water and avoid caffeine beverage due to these can cause dehydration. - blood pressure and weight have been stable - please discuss discontinuing lisinopril with cardiology   6. Cough Continue gastroesophageal reflux disease treatment with elevating the head your bed and taking antacids Continue taking over-the-counter antihistamines and nasal fluticasone to help with allergic rhinitis You need to try to suppress your cough to allow your larynx (voice box) to heal.  For three days don't talk, laugh, sing, or  clear your throat. Do everything you can to suppress the cough during this time. Use hard candies (sugarless Jolly Ranchers) or non-mint or non-menthol containing cough drops during this time to soothe your throat.  Use a cough suppressant (tessalon perles) around the clock during this time.  After three days, gradually increase the use of your voice and back off on the cough suppressants.         Fenton Foy, NP 11/13/2019

## 2019-11-13 NOTE — Patient Instructions (Signed)
1. COVID-19 - much improved since last visit  2.Acute Urinary Tract Infection, high risk for urosepsis  -Will complete Omnicef tomorrow - will order Diflucan for yeast - will recheck labs/kidney function -please follow up with PCP in 2 weeks  3. Nausea -Zofran PRN  4. Fever in other diseases - much improved - fever free since 11/09/19 -likely multifactorial COVID and UTI -Continue Tylenol for fever management - if needed - Temperature monitoring  5. Hypotension, unspecified hypotension -Likely secondary to fever, acute illness and hypovolemia  -Check BP twice daily and prior to evening dose of lisinopril -Hold lisinopril for any BP <110/80 -Hydrate with water and avoid caffeine beverage due to these can cause dehydration. - blood pressure and weight have been stable - please discuss discontinuing lisinopril with cardiology   6. Cough Continue gastroesophageal reflux disease treatment with elevating the head your bed and taking antacids Continue taking over-the-counter antihistamines and nasal fluticasone to help with allergic rhinitis You need to try to suppress your cough to allow your larynx (voice box) to heal.  For three days don't talk, laugh, sing, or clear your throat. Do everything you can to suppress the cough during this time. Use hard candies (sugarless Jolly Ranchers) or non-mint or non-menthol containing cough drops during this time to soothe your throat.  Use a cough suppressant (tessalon perles) around the clock during this time.  After three days, gradually increase the use of your voice and back off on the cough suppressants.

## 2019-11-13 NOTE — Assessment & Plan Note (Signed)
1. COVID-19 - much improved since last visit  2.Acute Urinary Tract Infection, high risk for urosepsis  -Will complete Omnicef tomorrow - will order Diflucan for yeast - will recheck labs/kidney function -please follow up with PCP in 2 weeks  3. Nausea -Zofran PRN  4. Fever in other diseases - much improved - fever free since 11/09/19 -likely multifactorial COVID and UTI -Continue Tylenol for fever management - if needed - Temperature monitoring  5. Hypotension, unspecified hypotension -Likely secondary to fever, acute illness and hypovolemia  -Check BP twice daily and prior to evening dose of lisinopril -Hold lisinopril for any BP <110/80 -Hydrate with water and avoid caffeine beverage due to these can cause dehydration. - blood pressure and weight have been stable - please discuss discontinuing lisinopril with cardiology   6. Cough Continue gastroesophageal reflux disease treatment with elevating the head your bed and taking antacids Continue taking over-the-counter antihistamines and nasal fluticasone to help with allergic rhinitis You need to try to suppress your cough to allow your larynx (voice box) to heal.  For three days don't talk, laugh, sing, or clear your throat. Do everything you can to suppress the cough during this time. Use hard candies (sugarless Jolly Ranchers) or non-mint or non-menthol containing cough drops during this time to soothe your throat.  Use a cough suppressant (tessalon perles) around the clock during this time.  After three days, gradually increase the use of your voice and back off on the cough suppressants.

## 2019-11-14 LAB — COMPREHENSIVE METABOLIC PANEL
ALT: 9 IU/L (ref 0–32)
AST: 19 IU/L (ref 0–40)
Albumin/Globulin Ratio: 2 (ref 1.2–2.2)
Albumin: 4.2 g/dL (ref 3.8–4.8)
Alkaline Phosphatase: 97 IU/L (ref 39–117)
BUN/Creatinine Ratio: 19 (ref 12–28)
BUN: 18 mg/dL (ref 8–27)
Bilirubin Total: 0.3 mg/dL (ref 0.0–1.2)
CO2: 23 mmol/L (ref 20–29)
Calcium: 9.3 mg/dL (ref 8.7–10.3)
Chloride: 108 mmol/L — ABNORMAL HIGH (ref 96–106)
Creatinine, Ser: 0.93 mg/dL (ref 0.57–1.00)
GFR calc Af Amer: 74 mL/min/{1.73_m2} (ref >59)
GFR calc non Af Amer: 64 mL/min/{1.73_m2} (ref >59)
Globulin, Total: 2.1 g/dL (ref 1.5–4.5)
Glucose: 87 mg/dL (ref 65–99)
Potassium: 4.7 mmol/L (ref 3.5–5.2)
Sodium: 144 mmol/L (ref 134–144)
Total Protein: 6.3 g/dL (ref 6.0–8.5)

## 2019-11-14 NOTE — Progress Notes (Signed)
Patient was given results. Verbally understood, no additional questions.

## 2019-11-19 ENCOUNTER — Other Ambulatory Visit: Payer: Self-pay | Admitting: Internal Medicine

## 2019-11-28 ENCOUNTER — Other Ambulatory Visit: Payer: Self-pay | Admitting: Internal Medicine

## 2019-11-28 ENCOUNTER — Encounter: Payer: Medicare Other | Admitting: Podiatry

## 2019-12-01 DIAGNOSIS — L570 Actinic keratosis: Secondary | ICD-10-CM | POA: Diagnosis not present

## 2019-12-01 DIAGNOSIS — L720 Epidermal cyst: Secondary | ICD-10-CM | POA: Diagnosis not present

## 2019-12-02 ENCOUNTER — Other Ambulatory Visit: Payer: Self-pay | Admitting: Internal Medicine

## 2019-12-03 ENCOUNTER — Other Ambulatory Visit (HOSPITAL_COMMUNITY): Payer: Self-pay | Admitting: Internal Medicine

## 2019-12-03 NOTE — Telephone Encounter (Signed)
This is a [place] pt 

## 2019-12-05 ENCOUNTER — Other Ambulatory Visit: Payer: Self-pay

## 2019-12-05 ENCOUNTER — Encounter: Payer: Medicare Other | Admitting: Podiatry

## 2019-12-06 ENCOUNTER — Other Ambulatory Visit: Payer: Self-pay | Admitting: Cardiovascular Disease

## 2019-12-15 DIAGNOSIS — M7582 Other shoulder lesions, left shoulder: Secondary | ICD-10-CM | POA: Diagnosis not present

## 2019-12-15 DIAGNOSIS — M75121 Complete rotator cuff tear or rupture of right shoulder, not specified as traumatic: Secondary | ICD-10-CM | POA: Diagnosis not present

## 2019-12-15 DIAGNOSIS — M7542 Impingement syndrome of left shoulder: Secondary | ICD-10-CM | POA: Diagnosis not present

## 2019-12-15 DIAGNOSIS — Z96611 Presence of right artificial shoulder joint: Secondary | ICD-10-CM | POA: Diagnosis not present

## 2019-12-19 ENCOUNTER — Encounter: Payer: Medicare Other | Admitting: Podiatry

## 2020-01-01 ENCOUNTER — Other Ambulatory Visit: Payer: Self-pay | Admitting: Internal Medicine

## 2020-01-05 ENCOUNTER — Other Ambulatory Visit (HOSPITAL_COMMUNITY): Payer: Self-pay | Admitting: Internal Medicine

## 2020-01-05 NOTE — Telephone Encounter (Signed)
Please advise if ok to refill Lisinopril 5 mg tablet qd last filled by Dr. Haroldine Laws.

## 2020-01-06 ENCOUNTER — Ambulatory Visit (INDEPENDENT_AMBULATORY_CARE_PROVIDER_SITE_OTHER): Payer: Medicare Other | Admitting: Internal Medicine

## 2020-01-06 ENCOUNTER — Encounter: Payer: Self-pay | Admitting: Internal Medicine

## 2020-01-06 ENCOUNTER — Other Ambulatory Visit: Payer: Self-pay

## 2020-01-06 VITALS — BP 112/62 | HR 70 | Temp 97.4°F | Resp 16 | Ht 61.0 in | Wt 160.4 lb

## 2020-01-06 DIAGNOSIS — K219 Gastro-esophageal reflux disease without esophagitis: Secondary | ICD-10-CM | POA: Diagnosis not present

## 2020-01-06 DIAGNOSIS — E7800 Pure hypercholesterolemia, unspecified: Secondary | ICD-10-CM | POA: Diagnosis not present

## 2020-01-06 DIAGNOSIS — E0590 Thyrotoxicosis, unspecified without thyrotoxic crisis or storm: Secondary | ICD-10-CM | POA: Diagnosis not present

## 2020-01-06 DIAGNOSIS — Z9109 Other allergy status, other than to drugs and biological substances: Secondary | ICD-10-CM

## 2020-01-06 DIAGNOSIS — Z96611 Presence of right artificial shoulder joint: Secondary | ICD-10-CM

## 2020-01-06 DIAGNOSIS — I471 Supraventricular tachycardia: Secondary | ICD-10-CM | POA: Diagnosis not present

## 2020-01-06 DIAGNOSIS — I420 Dilated cardiomyopathy: Secondary | ICD-10-CM | POA: Diagnosis not present

## 2020-01-06 DIAGNOSIS — R739 Hyperglycemia, unspecified: Secondary | ICD-10-CM | POA: Diagnosis not present

## 2020-01-06 DIAGNOSIS — Z8616 Personal history of COVID-19: Secondary | ICD-10-CM

## 2020-01-06 DIAGNOSIS — Z0000 Encounter for general adult medical examination without abnormal findings: Secondary | ICD-10-CM

## 2020-01-06 DIAGNOSIS — N8110 Cystocele, unspecified: Secondary | ICD-10-CM | POA: Diagnosis not present

## 2020-01-06 LAB — LIPID PANEL
Cholesterol: 137 mg/dL (ref 0–200)
HDL: 52.1 mg/dL (ref >39.00)
LDL Cholesterol: 72 mg/dL (ref 0–99)
NonHDL: 84.88
Total CHOL/HDL Ratio: 3
Triglycerides: 64 mg/dL (ref 0.0–149.0)
VLDL: 12.8 mg/dL (ref 0.0–40.0)

## 2020-01-06 LAB — BASIC METABOLIC PANEL
BUN: 15 mg/dL (ref 6–23)
CO2: 27 mEq/L (ref 19–32)
Calcium: 9.1 mg/dL (ref 8.4–10.5)
Chloride: 110 mEq/L (ref 96–112)
Creatinine, Ser: 0.92 mg/dL (ref 0.40–1.20)
GFR: 60.88 mL/min (ref >60.00)
Glucose, Bld: 99 mg/dL (ref 70–99)
Potassium: 4.5 mEq/L (ref 3.5–5.1)
Sodium: 141 mEq/L (ref 135–145)

## 2020-01-06 LAB — HEPATIC FUNCTION PANEL
ALT: 10 U/L (ref 0–35)
AST: 16 U/L (ref 0–37)
Albumin: 3.9 g/dL (ref 3.5–5.2)
Alkaline Phosphatase: 105 U/L (ref 39–117)
Bilirubin, Direct: 0.1 mg/dL (ref 0.0–0.3)
Total Bilirubin: 0.3 mg/dL (ref 0.2–1.2)
Total Protein: 6.5 g/dL (ref 6.0–8.3)

## 2020-01-06 LAB — HEMOGLOBIN A1C: Hgb A1c MFr Bld: 5.9 % (ref 4.6–6.5)

## 2020-01-06 NOTE — Assessment & Plan Note (Signed)
Physical today 01/06/20.  Mammogram 11/13/19 - Birads I.  Colonoscopy 02/2017 - recommended f/u in 5 years.

## 2020-01-06 NOTE — Telephone Encounter (Signed)
Not sure how this came to [place] but it is prescribed by Dr. Haroldine Laws.

## 2020-01-06 NOTE — Progress Notes (Signed)
[name], female   DOB: [birth year], 69 y.o.   MRN: [id number]   Subjective:    [name], female    DOB: 01-29-1951, 69 y.o.   MRN: [id number]  HPI This visit occurred during the SARS-CoV-2 public health emergency.  Safety protocols were in place, including screening questions prior to the visit, additional usage of staff PPE, and extensive cleaning of exam room while observing appropriate contact time as indicated for disinfecting solutions.  Patient with past history of afib, cardiomyopathy, hypertension and hypercholesterolemia.  She comes in today to follow up on these issues as well as for a complete physical exam.  She is s/p reverse right total shoulder arthroplasty.  Was released by ortho.  Diagnosed with covid.  Followed in respiratory clinic.  Had problems with hypotension.  Lisinopril held.  Was treated for UTI with omnicef and yeast infection - diflucan.  Remains off lisinopril.  Feels good.  Blood pressure doing well.  No chest pain or sob.  No acid reflux reported.  No abdominal pain.  Bowels stable.  She does report feeling as if her bladder has dropped.  States can feel at times when she sits down.  No pain.  No bleeding.    Past Medical History:  Diagnosis Date  . Allergy   . Atrial fibrillation ([place])   . Cancer ([place])    basal cell  . Cardiomyopathy, nonischemic ([place])   . Carpal tunnel syndrome    bilateral  . Congestive heart failure ([place])    normalization of EF with medical therapy  . Dysrhythmia 08/31/2015   long RP tachycardia  . Environmental and seasonal allergies   . Female cystocele   . Hyperlipidemia   . Hypertension   . Hyperthyroidism   . Migraines   . Overactive detrusor   . Personal history of colonic polyps   . PONV (postoperative nausea and vomiting)   . SUI (stress urinary incontinence, female)   . Uterovaginal prolapse   . UTI (urinary tract infection)   . Vaginal atrophy    Past Surgical History:    Procedure Laterality Date  . ANTERIOR AND POSTERIOR VAGINAL REPAIR W/ SACROSPINOUS LIGAMENT SUSPENSION  10/18/2012  . CARDIAC CATHETERIZATION N/A 11/26/2015   Procedure: Right/Left Heart Cath and Coronary Angiography;  Surgeon: Jolaine Artist, MD;  Location: [facility];  Service: Cardiovascular;  Laterality: N/A;  . CHOLECYSTECTOMY  2006  . COLONOSCOPY WITH PROPOFOL N/A 03/19/2017   Procedure: COLONOSCOPY WITH PROPOFOL;  Surgeon: Lollie Sails, MD;  [facility] END[facility]COPY;  Service: Endoscopy;  Laterality: N/A;  . COLPOPEXY  10/18/2012  . CYSTOURETHROSCOPY  10/18/2012  . ESOPHAGOGASTRODUODENOSCOPY (EGD) WITH PROPOFOL N/A 10/03/2016   Procedure: ESOPHAGOGASTRODUODENOSCOPY (EGD) WITH PROPOFOL;  Surgeon: Lollie Sails, MD;  Location: [facility] ENDOSCOPY;  Service: Endoscopy;  Laterality: N/A;  . EYE SURGERY     cataract extration  . FINGER SURGERY    . HYSTEROSCOPY  2011, 10/2010  . OOPHORECTOMY    . REVERSE SHOULDER ARTHROPLASTY Right 06/17/2019   Procedure: REVERSE SHOULDER ARTHROPLASTY;  Surgeon: Corky Mull, MD;  Location: [facility] ORS;  Service: Orthopedics;  Laterality: Right;  . TONSILLECTOMY    . TUBAL LIGATION    . VAGINAL HYSTERECTOMY     midurethral sling   Family History  Problem Relation Age of Onset  . Hypertension Mother   . Stroke Mother   . Diabetes Mother   . Hypercholesterolemia Father   . Heart disease Father  MI  . Hypertension Father   . Stroke Father   . Breast cancer Neg Hx   . Colon cancer Neg Hx   . Thyroid disease Neg Hx    Social History   Socioeconomic History  . Marital status: Married    Spouse name: Not on file  . Number of children: 2  . Years of education: Not on file  . Highest education level: Not on file  Occupational History  . Not on file  Tobacco Use  . Smoking status: Never Smoker  . Smokeless tobacco: Never Used  Substance and Sexual Activity  . Alcohol use: Yes    Alcohol/week: 0.0 standard drinks     Comment: Socially  . Drug use: No  . Sexual activity: Not on file  Other Topics Concern  . Not on file  Social History Narrative  . Not on file   Social Determinants of Health   Financial Resource Strain: Low Risk   . Difficulty of Paying Living Expenses: Not hard at all  Food Insecurity: No Food Insecurity  . Worried About Charity fundraiser in the Last Year: Never true  . Ran Out of Food in the Last Year: Never true  Transportation Needs: No Transportation Needs  . Lack of Transportation (Medical): No  . Lack of Transportation (Non-Medical): No  Physical Activity:   . Days of Exercise per Week:   . Minutes of Exercise per Session:   Stress:   . Feeling of Stress :   Social Connections:   . Frequency of Communication with Friends and Family:   . Frequency of Social Gatherings with Friends and Family:   . Attends Religious Services:   . Active Member of Clubs or Organizations:   . Attends Archivist Meetings:   Marland Kitchen Marital Status:     Outpatient Encounter Medications as of 01/06/2020  Medication Sig  . acetaminophen (TYLENOL) 500 MG tablet Take 500-1,000 mg by mouth every 6 (six) hours as needed for moderate pain or headache.  . ARTIFICIAL TEAR SOLUTION OP Place 1 drop into both eyes daily as needed (dry eyes).  . calcium carbonate (OSCAL) 1500 (600 Ca) MG TABS tablet Take 1,500 mg by mouth daily.   . calcium carbonate (TUMS - DOSED IN MG ELEMENTAL CALCIUM) 500 MG chewable tablet Chew 1 tablet by mouth daily as needed for indigestion or heartburn.  . carvedilol (COREG) 12.5 MG tablet Take 1 tablet (12.5 mg total) by mouth 2 (two) times daily with a meal.  . Cholecalciferol 1000 units capsule Take 1,000 Units by mouth daily.  . diphenhydramine-acetaminophen (TYLENOL PM) 25-500 MG TABS tablet Take 1 tablet by mouth at bedtime as needed (sleep).   . fexofenadine (ALLEGRA) 180 MG tablet Take 180 mg by mouth at bedtime.   . furosemide (LASIX) 20 MG tablet TAKE 1 TABLET  (20 MG TOTAL) BY MOUTH EVERY OTHER DAY.  . methimazole (TAPAZOLE) 10 MG tablet Take 5 mg by mouth daily.  . pantoprazole (PROTONIX) 40 MG tablet Take 40 mg by mouth daily.  . rosuvastatin (CRESTOR) 10 MG tablet TAKE 1 TABLET BY MOUTH EVERY DAY  . spironolactone (ALDACTONE) 25 MG tablet TAKE 1/2 TABLET BY MOUTH EVERY DAY  . SUMAtriptan (IMITREX) 100 MG tablet TAKE 1 TABLET BY MOUTH FOR 1 DOSE. MAY REPEAT IN 2 HOURS IF HEADACHE PERSISTS OR RECURS  . triamcinolone (NASACORT ALLERGY 24HR) 55 MCG/ACT AERO nasal inhaler Place 1 spray into the nose daily as needed (allergies).  . zonisamide (ZONEGRAN)  100 MG capsule TAKE 1 CAPSULE BY MOUTH EVERY DAY  . [DISCONTINUED] benzonatate (TESSALON) 100 MG capsule Take 1 capsule (100 mg total) by mouth 2 (two) times daily as needed for cough.  . [DISCONTINUED] lisinopril (ZESTRIL) 5 MG tablet Take 1 tablet (5 mg total) by mouth at bedtime. Reported on 11/11/2015 (Patient taking differently: Take 5 mg by mouth at bedtime. )  . [DISCONTINUED] ondansetron (ZOFRAN ODT) 4 MG disintegrating tablet Take 1 tablet (4 mg total) by mouth every 8 (eight) hours as needed for nausea or vomiting.  . [DISCONTINUED] promethazine-dextromethorphan (PROMETHAZINE-DM) 6.25-15 MG/5ML syrup Take 5 mLs by mouth 4 (four) times daily as needed for cough.   No facility-administered encounter medications on file as of 01/06/2020.   Review of Systems  Constitutional: Negative for appetite change and unexpected weight change.  HENT: Negative for congestion and sinus pressure.   Respiratory: Negative for cough, chest tightness and shortness of breath.   Cardiovascular: Negative for chest pain, palpitations and leg swelling.  Gastrointestinal: Negative for abdominal pain, diarrhea, nausea and vomiting.  Genitourinary: Negative for difficulty urinating and dysuria.       Feels like bladder has dropped.    Musculoskeletal: Negative for joint swelling and myalgias.  Skin: Negative for color  change and rash.  Neurological: Negative for dizziness, light-headedness and headaches.  Psychiatric/Behavioral: Negative for agitation and dysphoric mood.       Objective:    Physical Exam Constitutional:      General: She is not in acute distress.    Appearance: Normal appearance. She is well-developed.  HENT:     Head: Normocephalic and atraumatic.     Right Ear: External ear normal.     Left Ear: External ear normal.  Eyes:     General: No scleral icterus.       Right eye: No discharge.        Left eye: No discharge.     Conjunctiva/sclera: Conjunctivae normal.  Neck:     Thyroid: No thyromegaly.  Cardiovascular:     Rate and Rhythm: Normal rate and regular rhythm.  Pulmonary:     Effort: No tachypnea, accessory muscle usage or respiratory distress.     Breath sounds: Normal breath sounds. No decreased breath sounds or wheezing.  Chest:     Breasts:        Right: No inverted nipple, mass, nipple discharge or tenderness (no axillary adenopathy).        Left: No inverted nipple, mass, nipple discharge or tenderness (no axilarry adenopathy).  Abdominal:     General: Bowel sounds are normal.     Palpations: Abdomen is soft.     Tenderness: There is no abdominal tenderness.  Genitourinary:    Comments: Normal external genitalia.  Vaginal vault without lesions.  Vaginal wall/bladder prolapse.  Could not appreciate any adnexal masses or tenderness.   Musculoskeletal:        General: No swelling or tenderness.     Cervical back: Neck supple. No tenderness.  Lymphadenopathy:     Cervical: No cervical adenopathy.  Skin:    Findings: No erythema or rash.  Neurological:     Mental Status: She is alert and oriented to person, place, and time.  Psychiatric:        Mood and Affect: Mood normal.        Behavior: Behavior normal.     BP 112/62   Pulse 70   Temp (!) 97.4 F (36.3 C)   Resp 16  Ht _0  (1.549 m)   Wt 160 lb 6.4 oz (72.8 kg)   LMP 10/18/2012   SpO2 99%    BMI 30.31 kg/m  Wt Readings from Last 3 Encounters:  01/06/20 160 lb 6.4 oz (72.8 kg)  11/13/19 157 lb 8 oz (71.4 kg)  11/06/19 155 lb 8 oz (70.5 kg)     Lab Results  Component Value Date   WBC 6.9 11/03/2019   HGB 13.7 11/03/2019   HCT 43.6 11/03/2019   PLT 261 11/03/2019   GLUCOSE 99 01/06/2020   CHOL 137 01/06/2020   TRIG 64.0 01/06/2020   HDL 52.10 01/06/2020   LDLCALC 72 01/06/2020   ALT 10 01/06/2020   AST 16 01/06/2020   NA 141 01/06/2020   K 4.5 01/06/2020   CL 110 01/06/2020   CREATININE 0.92 01/06/2020   BUN 15 01/06/2020   CO2 27 01/06/2020   TSH 0.79 09/30/2019   INR 0.98 11/26/2015   HGBA1C 5.9 01/06/2020    MM 3D SCREEN BREAST BILATERAL  Result Date: 11/13/2019 CLINICAL DATA:  Screening. EXAM: DIGITAL SCREENING BILATERAL MAMMOGRAM WITH TOMO AND CAD COMPARISON:  Previous exam(s). ACR Breast Density Category b: There are scattered areas of fibroglandular density. FINDINGS: There are no findings suspicious for malignancy. Images were processed with CAD. IMPRESSION: No mammographic evidence of malignancy. A result letter of this screening mammogram will be mailed directly to the patient. RECOMMENDATION: Screening mammogram in one year. (Code:SM-B-01Y) BI-RADS CATEGORY  1: Negative. Electronically Signed   By: Abelardo Diesel M.D.   On: 11/13/2019 15:44       Assessment & Plan:   Problem List Items Addressed This Visit    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[place]) (Chronic)    Breathing stable.  Continue coreg.  Follow.  Continue f/u with cardiology.        Environmental allergies    Controlled.        GERD (gastroesophageal reflux disease)    Upper symptoms controlled on protonix.        Health care maintenance    Physical today 01/06/20.  Mammogram 11/13/19 - Birads I.  Colonoscopy 02/2017 - recommended f/u in 5 years.        History of COVID-19    No residual symptoms.  No sob.  No cough or congestion.        Hypercholesterolemia    On simvastatin.   Low cholesterol diet and exercise.  Follow lipid panel and liver function tests.        Relevant Orders   Hepatic function panel (Completed)   Lipid panel (Completed)   Basic metabolic panel (Completed)   Hyperglycemia - Primary    Low carb diet and exercise.  Follow met b and a1c.       Relevant Orders   Hemoglobin A1c (Completed)   Hyperthyroidism    Followed by endocrinology.  On tapazole.  Follow tsh.        Status post reverse total shoulder replacement, right    Followed by ortho.  Doing well.        SVT (supraventricular tachycardia) ([facility]) (Chronic)    On carvedilol.  Stable.  Followed by cardiology.       Vaginal prolapse    Documented cystocele.  Prolapse noted on exam.  Refer to urology.        Relevant Orders   Ambulatory referral to Urology       Einar Pheasant, MD

## 2020-01-07 ENCOUNTER — Encounter: Payer: Self-pay | Admitting: Internal Medicine

## 2020-01-11 ENCOUNTER — Encounter: Payer: Self-pay | Admitting: Internal Medicine

## 2020-01-11 NOTE — Assessment & Plan Note (Signed)
Upper symptoms controlled on protonix.  

## 2020-01-11 NOTE — Assessment & Plan Note (Signed)
Followed by endocrinology.  On tapazole.  Follow tsh.

## 2020-01-11 NOTE — Assessment & Plan Note (Signed)
Controlled.  

## 2020-01-11 NOTE — Assessment & Plan Note (Signed)
Followed by ortho.  Doing well.

## 2020-01-11 NOTE — Assessment & Plan Note (Signed)
No residual symptoms.  No sob.  No cough or congestion.

## 2020-01-11 NOTE — Assessment & Plan Note (Signed)
Documented cystocele.  Prolapse noted on exam.  Refer to urology.

## 2020-01-11 NOTE — Assessment & Plan Note (Signed)
On simvastatin.  Low cholesterol diet and exercise.  Follow lipid panel and liver function tests.   

## 2020-01-11 NOTE — Assessment & Plan Note (Signed)
Breathing stable.  Continue coreg.  Follow.  Continue f/u with cardiology.

## 2020-01-11 NOTE — Assessment & Plan Note (Signed)
Low carb diet and exercise.  Follow met b and a1c.  

## 2020-01-11 NOTE — Assessment & Plan Note (Signed)
On carvedilol.  Stable.  Followed by cardiology.

## 2020-01-16 NOTE — Telephone Encounter (Signed)
Patient is no longer taking this medication. Will update in chart.

## 2020-01-31 ENCOUNTER — Other Ambulatory Visit: Payer: Self-pay | Admitting: Internal Medicine

## 2020-02-02 ENCOUNTER — Ambulatory Visit (INDEPENDENT_AMBULATORY_CARE_PROVIDER_SITE_OTHER): Payer: Medicare Other | Admitting: Urology

## 2020-02-02 ENCOUNTER — Other Ambulatory Visit: Payer: Self-pay

## 2020-02-02 ENCOUNTER — Encounter: Payer: Self-pay | Admitting: Urology

## 2020-02-02 VITALS — BP 115/69 | HR 72

## 2020-02-02 DIAGNOSIS — Z86018 Personal history of other benign neoplasm: Secondary | ICD-10-CM | POA: Diagnosis not present

## 2020-02-02 DIAGNOSIS — Z859 Personal history of malignant neoplasm, unspecified: Secondary | ICD-10-CM | POA: Diagnosis not present

## 2020-02-02 DIAGNOSIS — L570 Actinic keratosis: Secondary | ICD-10-CM | POA: Diagnosis not present

## 2020-02-02 DIAGNOSIS — L578 Other skin changes due to chronic exposure to nonionizing radiation: Secondary | ICD-10-CM | POA: Diagnosis not present

## 2020-02-02 DIAGNOSIS — L821 Other seborrheic keratosis: Secondary | ICD-10-CM | POA: Diagnosis not present

## 2020-02-02 DIAGNOSIS — Z872 Personal history of diseases of the skin and subcutaneous tissue: Secondary | ICD-10-CM | POA: Diagnosis not present

## 2020-02-02 DIAGNOSIS — N3946 Mixed incontinence: Secondary | ICD-10-CM | POA: Diagnosis not present

## 2020-02-02 DIAGNOSIS — N8110 Cystocele, unspecified: Secondary | ICD-10-CM | POA: Diagnosis not present

## 2020-02-02 LAB — BLADDER SCAN AMB NON-IMAGING: Scan Result: 0

## 2020-02-02 MED ORDER — MIRABEGRON ER 50 MG PO TB24
50.0000 mg | ORAL_TABLET | Freq: Every day | ORAL | 11 refills | Status: DC
Start: 2020-02-02 — End: 2020-02-20

## 2020-02-02 NOTE — Progress Notes (Signed)
02/02/2020 2:13 PM   Kristi Greer [name] [birth year] [id number]  Referring provider: Einar Pheasant, [address] [place],  [place] [address]  Chief Complaint  Patient presents with  . Follow-up    HPI: I was consulted to assess the patient's prolapse.  She feels vaginal bulging.  Sometimes she reduces it.  Sometimes is uncomfortable after standing and then with sitting.  No splinting maneuvers.  She describes a hysterectomy and bladder suspension possibly 7 or 8 years ago done transvaginally  Sometimes leaks with coughing sneezing but not bending lifting.  Primary symptom is urge incontinence.  No bedwetting.  Wears 2 or 3 pads a day in severity depends on degree of urgency.  Sometimes the pads are soaked.  She is time voiding every 1 hour but cannot hold it for 2 hours.  She gets up 4-5 times at night.  Takes Lasix.  No ankle edema.  Flow is slower.  Sometimes it stops and starts.  She feels empty  She gets 2 bladder infections that respond favorably to antibiotics per year.  She has suprapubic pressure low back pain and increased frequency.  No neurologic issues.  No kidney stones.  Bowel movements normal   PMH: Past Medical History:  Diagnosis Date  . Allergy   . Atrial fibrillation ([place])   . Cancer ([place])    basal cell  . Cardiomyopathy, nonischemic ([place])   . Carpal tunnel syndrome    bilateral  . Congestive heart failure ([place])    normalization of EF with medical therapy  . Dysrhythmia 08/31/2015   long RP tachycardia  . Environmental and seasonal allergies   . Female cystocele   . Hyperlipidemia   . Hypertension   . Hyperthyroidism   . Migraines   . Overactive detrusor   . Personal history of colonic polyps   . PONV (postoperative nausea and vomiting)   . SUI (stress urinary incontinence, female)   . Uterovaginal prolapse   . UTI (urinary tract infection)   . Vaginal atrophy     Surgical History: Past Surgical History:    Procedure Laterality Date  . ANTERIOR AND POSTERIOR VAGINAL REPAIR W/ SACROSPINOUS LIGAMENT SUSPENSION  10/18/2012  . CARDIAC CATHETERIZATION N/A 11/26/2015   Procedure: Right/Left Heart Cath and Coronary Angiography;  Surgeon: Jolaine Artist, MD;  Location: [facility];  Service: Cardiovascular;  Laterality: N/A;  . CHOLECYSTECTOMY  2006  . COLONOSCOPY WITH PROPOFOL N/A 03/19/2017   Procedure: COLONOSCOPY WITH PROPOFOL;  Surgeon: Lollie Sails, MD;  Location: [facility] ENDOSCOPY;  Service: Endoscopy;  Laterality: N/A;  . COLPOPEXY  10/18/2012  . CYSTOURETHROSCOPY  10/18/2012  . ESOPHAGOGASTRODUODENOSCOPY (EGD) WITH PROPOFOL N/A 10/03/2016   Procedure: ESOPHAGOGASTRODUODENOSCOPY (EGD) WITH PROPOFOL;  Surgeon: Lollie Sails, MD;  Location[facility]OSCOPY;  Service: Endoscopy;  Laterality: N/A;  . EYE SURGERY     cataract extration  . FINGER SURGERY    . HYSTEROSCOPY  2011, 10/2010  . OOPHORECTOMY    . REVERSE SHOULDER ARTHROPLASTY Right 06/17/2019   Procedure: REVERSE SHOULDER ARTHROPLASTY;  Surgeon: Corky Mull, MD;  Location: [facility] ORS;  Service: Orthopedics;  Laterality: Right;  . TONSILLECTOMY    . TUBAL LIGATION    . VAGINAL HYSTERECTOMY     midurethral sling    Home Medications:  Allergies as of 02/02/2020   No Known Allergies     Medication List       Accurate as of February 02, 2020  2:13 PM. If you have  any questions, ask your nurse or doctor.        acetaminophen 500 MG tablet Commonly known as: TYLENOL Take 500-1,000 mg by mouth every 6 (six) hours as needed for moderate pain or headache.   ARTIFICIAL TEAR SOLUTION OP Place 1 drop into both eyes daily as needed (dry eyes).   calcium carbonate 1500 (600 Ca) MG Tabs tablet Commonly known as: OSCAL Take 1,500 mg by mouth daily.   calcium carbonate 500 MG chewable tablet Commonly known as: TUMS - dosed in mg elemental calcium Chew 1 tablet by mouth daily as needed for indigestion or heartburn.    carvedilol 12.5 MG tablet Commonly known as: COREG Take 1 tablet (12.5 mg total) by mouth 2 (two) times daily with a meal.   Cholecalciferol 25 MCG (1000 UT) capsule Take 1,000 Units by mouth daily.   diphenhydramine-acetaminophen 25-500 MG Tabs tablet Commonly known as: TYLENOL PM Take 1 tablet by mouth at bedtime as needed (sleep).   fexofenadine 180 MG tablet Commonly known as: ALLEGRA Take 180 mg by mouth at bedtime.   furosemide 20 MG tablet Commonly known as: LASIX TAKE 1 TABLET (20 MG TOTAL) BY MOUTH EVERY OTHER DAY.   methimazole 10 MG tablet Commonly known as: TAPAZOLE Take 5 mg by mouth daily.   Nasacort Allergy 24HR 55 MCG/ACT Aero nasal inhaler Generic drug: triamcinolone Place 1 spray into the nose daily as needed (allergies).   pantoprazole 40 MG tablet Commonly known as: PROTONIX Take 40 mg by mouth daily.   rosuvastatin 10 MG tablet Commonly known as: CRESTOR TAKE 1 TABLET BY MOUTH EVERY DAY   spironolactone 25 MG tablet Commonly known as: ALDACTONE TAKE 1/2 TABLET BY MOUTH EVERY DAY   SUMAtriptan 100 MG tablet Commonly known as: IMITREX TAKE 1 TABLET BY MOUTH FOR 1 DOSE. MAY REPEAT IN 2 HOURS IF HEADACHE PERSISTS OR RECURS   zonisamide 100 MG capsule Commonly known as: ZONEGRAN TAKE 1 CAPSULE BY MOUTH EVERY DAY       Allergies: No Known Allergies  Family History: Family History  Problem Relation Age of Onset  . Hypertension Mother   . Stroke Mother   . Diabetes Mother   . Hypercholesterolemia Father   . Heart disease Father        MI  . Hypertension Father   . Stroke Father   . Breast cancer Neg Hx   . Colon cancer Neg Hx   . Thyroid disease Neg Hx     Social History:  reports that she has never smoked. She has never used smokeless tobacco. She reports current alcohol use. She reports that she does not use drugs.  ROS:                                        Physical Exam: BP 115/69   Pulse 72   LMP  10/18/2012   Constitutional:  Alert and oriented, No acute distress. HEENT: [place] AT, moist mucus membranes.  Trachea midline, no masses. Cardiovascular: No clubbing, cyanosis, or edema. Respiratory: Normal respiratory effort, no increased work of breathing. GI: Abdomen is soft, nontender, nondistended, no abdominal masses GU: No CVA tenderness.  On pelvic examination patient has small grade 3 cystocele with central defect that reached the introitus.  Vaginal cuff descended from approximately 8 cm to 5 cm.  No rectocele.  Well supported bladder neck.  No stress incontinence with cystocele  reduced.  No mesh extrusion.  Vaginal length mildly decreased Skin: No rashes, bruises or suspicious lesions. Lymph: No cervical or inguinal adenopathy. Neurologic: Grossly intact, no focal deficits, moving all 4 extremities. Psychiatric: Normal mood and affect.  Laboratory Data: Lab Results  Component Value Date   WBC 6.9 11/03/2019   HGB 13.7 11/03/2019   HCT 43.6 11/03/2019   MCV 82 11/03/2019   PLT 261 11/03/2019    Lab Results  Component Value Date   CREATININE 0.92 01/06/2020    No results found for: PSA  No results found for: TESTOSTERONE  Lab Results  Component Value Date   HGBA1C 5.9 01/06/2020    Urinalysis    Component Value Date/Time   COLORURINE YELLOW 05/27/2018 1138   APPEARANCEUR CLEAR 05/27/2018 1138   LABSPEC >=1.030 (A) 05/27/2018 1138   PHURINE 5.5 05/27/2018 1138   GLUCOSEU NEGATIVE 05/27/2018 1138   HGBUR NEGATIVE 05/27/2018 1138   BILIRUBINUR neg 11/03/2019 2009   KETONESUR NEGATIVE 05/27/2018 1138   PROTEINUR Positive (A) 11/03/2019 2009   PROTEINUR NEGATIVE 11/15/2015 0003   UROBILINOGEN 0.2 11/03/2019 2009   UROBILINOGEN 0.2 05/27/2018 1138   NITRITE Negative 11/03/2019 2009   NITRITE NEGATIVE 05/27/2018 1138   LEUKOCYTESUR Moderate (2+) (A) 11/03/2019 2009    Pertinent Imaging:   Assessment & Plan: Patient has mixed incontinence.  She primarily has  urge incontinence.  She has a frequent bladder.  She has significant nocturia.  History of drawn.  If patient ever had surgery she would likely best benefit from a transvaginal vault suspension with cystocele repair and graft.  Role of urodynamics and cystoscopy discussed.  Patient and her husband would like to avoid surgery.  I briefly reviewed it with her.  She would like her urge incontinence treated with Myrbetriq 50 mg samples and prescription and see a gynecologist for pessary fitting and will send a referral.  She would like to be sexually active but I think she can be taught how to change it herself.  We will proceed accordingly.  She understands in most cases of prolapse is not related to the overactive bladder and urgency as her dominating complaint.  It appears she had a lot of discomfort after her last operation hopes not to have another 1  1. Bladder prolapse, female, acquired  - Urinalysis, Complete   No follow-ups on file.  Reece Packer, MD  [facility] [address] [place], [place] [address] [phone]

## 2020-02-03 ENCOUNTER — Ambulatory Visit: Payer: Medicare Other | Admitting: Urology

## 2020-02-08 ENCOUNTER — Other Ambulatory Visit: Payer: Self-pay | Admitting: Endocrinology

## 2020-02-19 ENCOUNTER — Other Ambulatory Visit: Payer: Self-pay | Admitting: Internal Medicine

## 2020-02-20 ENCOUNTER — Ambulatory Visit (INDEPENDENT_AMBULATORY_CARE_PROVIDER_SITE_OTHER): Payer: Medicare Other | Admitting: Obstetrics and Gynecology

## 2020-02-20 ENCOUNTER — Encounter: Payer: Self-pay | Admitting: Obstetrics and Gynecology

## 2020-02-20 ENCOUNTER — Other Ambulatory Visit: Payer: Self-pay

## 2020-02-20 VITALS — BP 110/70 | HR 78 | Resp 18 | Ht 61.0 in | Wt 163.4 lb

## 2020-02-20 DIAGNOSIS — N8110 Cystocele, unspecified: Secondary | ICD-10-CM | POA: Diagnosis not present

## 2020-02-20 NOTE — Progress Notes (Signed)
[name], female   DOB: [birth year], 69 y.o.   MRN: [id number]  Reason for Consult: Gynecologic Exam (Pessary fitting)   Referred by Einar Pheasant, MD  Subjective:     HPI:  Kristi Greer is a 69 y.o. female she presents today for referral for pessary placement.  She reports that she has a longstanding history of a cystocele.  She reports that she had a hysterectomy several years ago and a anterior wall repair at that time.  She reports that she has not had a reoccurrence of the cystocele.  She tolerated this for a long time but it has been coming increasingly uncomfortable with periods of long standing.  She does not desire to have repeat surgery to try and correct it because of the risk of recurrence of the prolapse.  For this reason she would like to have a pessary fitted.  She does report that she is still sexually active with her husband. She denies any bleeding.  She reports that she does have issues with urination.  She reports that sometimes she must use her finger to place inside the vagina to be able to urinate.  She reports that sometimes she will have spasming of the urine or feel like she pees but is unable to empty and then has to pee again a short time later.  She does not endorse stress incontinence.  She denies vaginal bleeding.  She reports that she longer has her uterus cervix or fallopian tubes or ovaries.   Past Medical History:  Diagnosis Date  . Allergy   . Atrial fibrillation ([place])   . Cancer ([place])    basal cell  . Cardiomyopathy, nonischemic ([place])   . Carpal tunnel syndrome    bilateral  . Congestive heart failure ([place])    normalization of EF with medical therapy  . Dysrhythmia 08/31/2015   long RP tachycardia  . Environmental and seasonal allergies   . Female cystocele   . Hyperlipidemia   . Hypertension   . Hyperthyroidism   . Migraines   . Overactive detrusor   . Personal history of colonic polyps   . PONV (postoperative  nausea and vomiting)   . SUI (stress urinary incontinence, female)   . Uterovaginal prolapse   . UTI (urinary tract infection)   . Vaginal atrophy    Family History  Problem Relation Age of Onset  . Hypertension Mother   . Stroke Mother   . Diabetes Mother   . Hypercholesterolemia Father   . Heart disease Father        MI  . Hypertension Father   . Stroke Father   . Breast cancer Neg Hx   . Colon cancer Neg Hx   . Thyroid disease Neg Hx    Past Surgical History:  Procedure Laterality Date  . ANTERIOR AND POSTERIOR VAGINAL REPAIR W/ SACROSPINOUS LIGAMENT SUSPENSION  10/18/2012  . CARDIAC CATHETERIZATION N/A 11/26/2015   Procedure: Right/Left Heart Cath and Coronary Angiography;  Surgeon: Jolaine Artist, MD;  Location: [facility];  Service: Cardiovascular;  Laterality: N/A;  . CHOLECYSTECTOMY  2006  . COLONOSCOPY WITH PROPOFOL N/A 03/19/2017   Procedure: COLONOSCOPY WITH PROPOFOL;  Surgeon: Lollie Sails, MD;  Location: [facility] [place] ENDOSCOPY;  Service: Endoscopy;  Laterality: N/A;  . COLPOPEXY  10/18/2012  . CYSTOURETHROSCOPY  10/18/2012  . ESOPHAGOGASTRODUODENOSCOPY (EGD) WITH PROPOFOL N/A 10/03/2016   Procedure: ESOPHAGOGASTRODUODENOSCOPY (EGD) WITH PROPOFOL;  Surgeon: Lollie Sails, MD;  Location: [facility] ENDOSCOPY;  Service: Endoscopy;  Laterality: N/A;  . EYE SURGERY     cataract extration  . FINGER SURGERY    . HYSTEROSCOPY  2011, 10/2010  . OOPHORECTOMY    . REVERSE SHOULDER ARTHROPLASTY Right 06/17/2019   Procedure: REVERSE SHOULDER ARTHROPLASTY;  Surgeon: Corky Mull, MD;  Location: [facility] ORS;  Service: Orthopedics;  Laterality: Right;  . TONSILLECTOMY    . TUBAL LIGATION    . VAGINAL HYSTERECTOMY     midurethral sling    Short Social History:  Social History   Tobacco Use  . Smoking status: Never Smoker  . Smokeless tobacco: Never Used  Substance Use Topics  . Alcohol use: Yes    Alcohol/week: 0.0 standard drinks    Comment: Socially    No Known  Allergies  Current Outpatient Medications  Medication Sig Dispense Refill  . acetaminophen (TYLENOL) 500 MG tablet Take 500-1,000 mg by mouth every 6 (six) hours as needed for moderate pain or headache.    . ARTIFICIAL TEAR SOLUTION OP Place 1 drop into both eyes daily as needed (dry eyes).    . calcium carbonate (OSCAL) 1500 (600 Ca) MG TABS tablet Take 1,500 mg by mouth daily.     . calcium carbonate (TUMS - DOSED IN MG ELEMENTAL CALCIUM) 500 MG chewable tablet Chew 1 tablet by mouth daily as needed for indigestion or heartburn.    . carvedilol (COREG) 12.5 MG tablet Take 1 tablet (12.5 mg total) by mouth 2 (two) times daily with a meal. 180 tablet 0  . Cholecalciferol 1000 units capsule Take 1,000 Units by mouth daily.    . diphenhydramine-acetaminophen (TYLENOL PM) 25-500 MG TABS tablet Take 1 tablet by mouth at bedtime as needed (sleep).     . fexofenadine (ALLEGRA) 180 MG tablet Take 180 mg by mouth at bedtime.     . furosemide (LASIX) 20 MG tablet TAKE 1 TABLET (20 MG TOTAL) BY MOUTH EVERY OTHER DAY. 45 tablet 3  . methimazole (TAPAZOLE) 10 MG tablet TAKE 1 TABLET BY MOUTH DAILY 90 tablet 3  . pantoprazole (PROTONIX) 40 MG tablet Take 40 mg by mouth daily.    . rosuvastatin (CRESTOR) 10 MG tablet TAKE 1 TABLET BY MOUTH EVERY DAY 30 tablet 1  . spironolactone (ALDACTONE) 25 MG tablet TAKE 1/2 TABLET BY MOUTH EVERY DAY 45 tablet 3  . SUMAtriptan (IMITREX) 100 MG tablet TAKE 1 TABLET BY MOUTH FOR 1 DOSE. MAY REPEAT IN 2 HOURS IF HEADACHE PERSISTS OR RECURS 9 tablet 0  . triamcinolone (NASACORT ALLERGY 24HR) 55 MCG/ACT AERO nasal inhaler Place 1 spray into the nose daily as needed (allergies).    . zonisamide (ZONEGRAN) 100 MG capsule TAKE 1 CAPSULE BY MOUTH EVERY DAY 90 capsule 0   No current facility-administered medications for this visit.    Review of Systems  Constitutional: Negative for chills, fatigue, fever and unexpected weight change.  HENT: Negative for trouble swallowing.    Eyes: Negative for loss of vision.  Respiratory: Negative for cough, shortness of breath and wheezing.  Cardiovascular: Negative for chest pain, leg swelling, palpitations and syncope.  GI: Negative for abdominal pain, blood in stool, diarrhea, nausea and vomiting.  GU: Positive for difficulty urinating. Negative for dysuria, frequency and hematuria.  Musculoskeletal: Negative for back pain, leg pain and joint pain.  Skin: Negative for rash.  Neurological: Negative for dizziness, headaches, light-headedness, numbness and seizures.  Psychiatric: Negative for behavioral problem, confusion, depressed mood and sleep disturbance.  Objective:  Objective   Vitals:   02/20/20 1030  BP: 110/70  Pulse: 78  Resp: 18  SpO2: 98%  Weight: 163 lb 6.4 oz (74.1 kg)  Height: 5\' 1"  (1.549 m)   Body mass index is 30.87 kg/m.  Physical Exam Vitals and nursing note reviewed.  Constitutional:      Appearance: She is well-developed.  HENT:     Head: Normocephalic and atraumatic.  Eyes:     Pupils: Pupils are equal, round, and reactive to light.  Cardiovascular:     Rate and Rhythm: Normal rate and regular rhythm.  Pulmonary:     Effort: Pulmonary effort is normal. No respiratory distress.  Genitourinary:    Comments:   Skin:    General: Skin is warm and dry.  Neurological:     Mental Status: She is alert and oriented to person, place, and time.  Psychiatric:        Behavior: Behavior normal.        Thought Content: Thought content normal.        Judgment: Judgment normal.    Normal appearance of vulva. External hemorrhoids noted on exam Normal pelvic exam without pelvic masses or fixed pelvis. Normal appearance of vaginal mucosa.  POP-Q exam:      Aa = +1 Ba = +3 C = 0  gH = 4 pb = 3 TVL = 8  Ap = -3 BP = -2 D = 5   Summary statement of POP-Q exam:    the anterior vaginal wall is 3 cm in front the level of the hymen the cuff / is 5 cm behind the level of the hymen,   and the posterior vaginal wall is 2 cm behing the level of the hymen   Pessary fitting: Tried a size 2 ring- fell out Size 3 ring- worked well Size 4 ring- discomfort with seated position Seize 3 dish with knob- not as comfortable as the size 3 ring     Assessment/Plan:     Stage 3 cystocele Was fit best with a size 3 ring pessary. Will order. Patient will return for placement. She is still sexually active so she will need to learn about placement and removal of the pessary.   More than 30 minutes were spent face to face with the patient in the room, reviewing the medical record, labs and images, and coordinating care for the patient. The plan of management was discussed in detail and counseling was provided.      Adrian Prows MD [facility], [facility] 02/20/2020 12:37 PM

## 2020-03-02 ENCOUNTER — Other Ambulatory Visit: Payer: Self-pay | Admitting: Internal Medicine

## 2020-03-15 ENCOUNTER — Ambulatory Visit: Payer: Self-pay | Admitting: Urology

## 2020-03-21 ENCOUNTER — Other Ambulatory Visit: Payer: Self-pay | Admitting: Internal Medicine

## 2020-04-02 ENCOUNTER — Ambulatory Visit: Payer: Medicare Other

## 2020-04-13 ENCOUNTER — Ambulatory Visit: Payer: Medicare Other | Admitting: Podiatry

## 2020-04-14 DIAGNOSIS — L570 Actinic keratosis: Secondary | ICD-10-CM | POA: Diagnosis not present

## 2020-04-14 DIAGNOSIS — L821 Other seborrheic keratosis: Secondary | ICD-10-CM | POA: Diagnosis not present

## 2020-04-19 ENCOUNTER — Other Ambulatory Visit: Payer: Self-pay | Admitting: Internal Medicine

## 2020-04-23 ENCOUNTER — Ambulatory Visit: Payer: Medicare Other | Admitting: Podiatry

## 2020-05-11 ENCOUNTER — Ambulatory Visit: Payer: Medicare Other | Admitting: Podiatry

## 2020-05-11 ENCOUNTER — Other Ambulatory Visit: Payer: Self-pay

## 2020-05-11 ENCOUNTER — Encounter: Payer: Self-pay | Admitting: Internal Medicine

## 2020-05-11 ENCOUNTER — Ambulatory Visit (INDEPENDENT_AMBULATORY_CARE_PROVIDER_SITE_OTHER): Payer: Medicare Other | Admitting: Internal Medicine

## 2020-05-11 ENCOUNTER — Ambulatory Visit (INDEPENDENT_AMBULATORY_CARE_PROVIDER_SITE_OTHER): Payer: Medicare Other

## 2020-05-11 VITALS — BP 110/72 | HR 76 | Temp 98.0°F | Ht 60.98 in | Wt 151.8 lb

## 2020-05-11 DIAGNOSIS — I5022 Chronic systolic (congestive) heart failure: Secondary | ICD-10-CM | POA: Diagnosis not present

## 2020-05-11 DIAGNOSIS — I471 Supraventricular tachycardia: Secondary | ICD-10-CM | POA: Diagnosis not present

## 2020-05-11 DIAGNOSIS — E7800 Pure hypercholesterolemia, unspecified: Secondary | ICD-10-CM | POA: Diagnosis not present

## 2020-05-11 DIAGNOSIS — R739 Hyperglycemia, unspecified: Secondary | ICD-10-CM

## 2020-05-11 DIAGNOSIS — E0590 Thyrotoxicosis, unspecified without thyrotoxic crisis or storm: Secondary | ICD-10-CM

## 2020-05-11 DIAGNOSIS — M5442 Lumbago with sciatica, left side: Secondary | ICD-10-CM

## 2020-05-11 DIAGNOSIS — M545 Low back pain, unspecified: Secondary | ICD-10-CM | POA: Insufficient documentation

## 2020-05-11 DIAGNOSIS — N8110 Cystocele, unspecified: Secondary | ICD-10-CM | POA: Diagnosis not present

## 2020-05-11 DIAGNOSIS — G8929 Other chronic pain: Secondary | ICD-10-CM | POA: Insufficient documentation

## 2020-05-11 DIAGNOSIS — I420 Dilated cardiomyopathy: Secondary | ICD-10-CM

## 2020-05-11 LAB — CBC WITH DIFFERENTIAL/PLATELET
Basophils Absolute: 0.1 10*3/uL (ref 0.0–0.1)
Basophils Relative: 0.7 % (ref 0.0–3.0)
Eosinophils Absolute: 0.2 10*3/uL (ref 0.0–0.7)
Eosinophils Relative: 3.1 % (ref 0.0–5.0)
HCT: 40 % (ref 36.0–46.0)
Hemoglobin: 12.8 g/dL (ref 12.0–15.0)
Lymphocytes Relative: 27.5 % (ref 12.0–46.0)
Lymphs Abs: 2 10*3/uL (ref 0.7–4.0)
MCHC: 32 g/dL (ref 30.0–36.0)
MCV: 82.5 fl (ref 78.0–100.0)
Monocytes Absolute: 0.5 10*3/uL (ref 0.1–1.0)
Monocytes Relative: 6.6 % (ref 3.0–12.0)
Neutro Abs: 4.6 10*3/uL (ref 1.4–7.7)
Neutrophils Relative %: 62.1 % (ref 43.0–77.0)
Platelets: 220 10*3/uL (ref 150.0–400.0)
RBC: 4.85 Mil/uL (ref 3.87–5.11)
RDW: 16.9 % — ABNORMAL HIGH (ref 11.5–15.5)
WBC: 7.4 10*3/uL (ref 4.0–10.5)

## 2020-05-11 LAB — TSH: TSH: 2.3 u[IU]/mL (ref 0.35–4.50)

## 2020-05-11 LAB — HEPATIC FUNCTION PANEL
ALT: 13 U/L (ref 0–35)
AST: 20 U/L (ref 0–37)
Albumin: 4.4 g/dL (ref 3.5–5.2)
Alkaline Phosphatase: 101 U/L (ref 39–117)
Bilirubin, Direct: 0.1 mg/dL (ref 0.0–0.3)
Total Bilirubin: 0.5 mg/dL (ref 0.2–1.2)
Total Protein: 6.9 g/dL (ref 6.0–8.3)

## 2020-05-11 LAB — BASIC METABOLIC PANEL
BUN: 26 mg/dL — ABNORMAL HIGH (ref 6–23)
CO2: 27 mEq/L (ref 19–32)
Calcium: 9.9 mg/dL (ref 8.4–10.5)
Chloride: 104 mEq/L (ref 96–112)
Creatinine, Ser: 0.86 mg/dL (ref 0.40–1.20)
GFR: 65.74 mL/min (ref >60.00)
Glucose, Bld: 93 mg/dL (ref 70–99)
Potassium: 4.5 mEq/L (ref 3.5–5.1)
Sodium: 140 mEq/L (ref 135–145)

## 2020-05-11 LAB — LIPID PANEL
Cholesterol: 122 mg/dL (ref 0–200)
HDL: 48.8 mg/dL (ref >39.00)
LDL Cholesterol: 56 mg/dL (ref 0–99)
NonHDL: 73.02
Total CHOL/HDL Ratio: 2
Triglycerides: 85 mg/dL (ref 0.0–149.0)
VLDL: 17 mg/dL (ref 0.0–40.0)

## 2020-05-11 LAB — HEMOGLOBIN A1C: Hgb A1c MFr Bld: 6.1 % (ref 4.6–6.5)

## 2020-05-11 MED ORDER — METHYLPREDNISOLONE 4 MG PO TBPK
ORAL_TABLET | ORAL | 0 refills | Status: DC
Start: 2020-05-11 — End: 2020-05-31

## 2020-05-11 NOTE — Progress Notes (Signed)
[name], female   DOB: 12-15-1950, 69 y.o.   MRN: [id number]   Subjective:    [name], female    DOB: 1950-10-16, 69 y.o.   MRN: [id number]  HPI This visit occurred during the SARS-CoV-2 public health emergency.  Safety protocols were in place, including screening questions prior to the visit, additional usage of staff PPE, and extensive cleaning of exam room while observing appropriate contact time as indicated for disinfecting solutions.  Patient here for a scheduled follow up. Increased stress with her husband's health issues.  He is in ER now.  She just came from ER.  Discussed with her.  She feels she is handling things relatively well.  Has good support.  She has been having problems with left hip and leg pain.  Worse over the last two weeks.  Reports no chest pain or increased sob.  No acid reflux.  No abdominal pain.  Bowels moving.  Saw gyn 02/20/20 - pessary.  Stable.    Past Medical History:  Diagnosis Date  . Allergy   . Atrial fibrillation ([place])   . Cancer ([place])    basal cell  . Cardiomyopathy, nonischemic ([place])   . Carpal tunnel syndrome    bilateral  . Congestive heart failure ([place])    normalization of EF with medical therapy  . Dysrhythmia 08/31/2015   long RP tachycardia  . Environmental and seasonal allergies   . Female cystocele   . Hyperlipidemia   . Hypertension   . Hyperthyroidism   . Migraines   . Overactive detrusor   . Personal history of colonic polyps   . PONV (postoperative nausea and vomiting)   . SUI (stress urinary incontinence, female)   . Uterovaginal prolapse   . UTI (urinary tract infection)   . Vaginal atrophy    Past Surgical History:  Procedure Laterality Date  . ANTERIOR AND POSTERIOR VAGINAL REPAIR W/ SACROSPINOUS LIGAMENT SUSPENSION  10/18/2012  . CARDIAC CATHETERIZATION N/A 11/26/2015   Procedure: Right/Left Heart Cath and Coronary Angiography;  Surgeon: Jolaine Artist, MD;  Location: [place] [facility];  Service: Cardiovascular;  Laterality: N/A;  . CHOLECYSTECTOMY  2006  . COLONOSCOPY WITH PROPOFOL N/A 03/19/2017   Procedure: COLONOSCOPY WITH PROPOFOL;  Surgeon: Lollie Sails, MD;  Location: [facility] ENDOSCOPY;  Service: Endoscopy;  Laterality: N/A;  . COLPOPEXY  10/18/2012  . CYSTOURETHROSCOPY  10/18/2012  . ESOPHAGOGASTRODUODENOSCOPY (EGD) WITH PROPOFOL N/A 10/03/2016   Procedure: ESOPHAGOGASTRODUODENOSCOPY (EGD) WITH PROPOFOL;  Surgeon: Lollie Sails, MD;  Location: [facility] ENDOSCOPY;  Service: Endoscopy;  Laterality: N/A;  . EYE SURGERY     cataract extration  . FINGER SURGERY    . HYSTEROSCOPY  2011, 10/2010  . OOPHORECTOMY    . REVERSE SHOULDER ARTHROPLASTY Right 06/17/2019   Procedure: REVERSE SHOULDER ARTHROPLASTY;  Surgeon: Corky Mull, MD;  Location: [facility] ORS;  Service: Orthopedics;  Laterality: Right;  . TONSILLECTOMY    . TUBAL LIGATION    . VAGINAL HYSTERECTOMY     midurethral sling   Family History  Problem Relation Age of Onset  . Hypertension Mother   . Stroke Mother   . Diabetes Mother   . Hypercholesterolemia Father   . Heart disease Father        MI  . Hypertension Father   . Stroke Father   . Breast cancer Neg Hx   . Colon cancer Neg Hx   . Thyroid disease Neg Hx    Social  History   Socioeconomic History  . Marital status: Married    Spouse name: Not on file  . Number of children: 2  . Years of education: Not on file  . Highest education level: Not on file  Occupational History  . Not on file  Tobacco Use  . Smoking status: Never Smoker  . Smokeless tobacco: Never Used  Vaping Use  . Vaping Use: Never used  Substance and Sexual Activity  . Alcohol use: Yes    Alcohol/week: 0.0 standard drinks    Comment: Socially  . Drug use: No  . Sexual activity: Not on file  Other Topics Concern  . Not on file  Social History Narrative  . Not on file   Social Determinants of Health   Financial Resource Strain:   . Difficulty of  Paying Living Expenses: Not on file  Food Insecurity:   . Worried About Charity fundraiser in the Last Year: Not on file  . Ran Out of Food in the Last Year: Not on file  Transportation Needs:   . Lack of Transportation (Medical): Not on file  . Lack of Transportation (Non-Medical): Not on file  Physical Activity:   . Days of Exercise per Week: Not on file  . Minutes of Exercise per Session: Not on file  Stress:   . Feeling of Stress : Not on file  Social Connections:   . Frequency of Communication with Friends and Family: Not on file  . Frequency of Social Gatherings with Friends and Family: Not on file  . Attends Religious Services: Not on file  . Active Member of Clubs or Organizations: Not on file  . Attends Archivist Meetings: Not on file  . Marital Status: Not on file    Outpatient Encounter Medications as of 05/11/2020  Medication Sig  . acetaminophen (TYLENOL) 500 MG tablet Take 500-1,000 mg by mouth every 6 (six) hours as needed for moderate pain or headache.  . ARTIFICIAL TEAR SOLUTION OP Place 1 drop into both eyes daily as needed (dry eyes).  . calcium carbonate (OSCAL) 1500 (600 Ca) MG TABS tablet Take 1,500 mg by mouth daily.   . calcium carbonate (TUMS - DOSED IN MG ELEMENTAL CALCIUM) 500 MG chewable tablet Chew 1 tablet by mouth daily as needed for indigestion or heartburn.  . carvedilol (COREG) 12.5 MG tablet Take 1 tablet (12.5 mg total) by mouth 2 (two) times daily with a meal.  . Cholecalciferol 1000 units capsule Take 1,000 Units by mouth daily.  . diphenhydramine-acetaminophen (TYLENOL PM) 25-500 MG TABS tablet Take 1 tablet by mouth at bedtime as needed (sleep).   . fexofenadine (ALLEGRA) 180 MG tablet Take 180 mg by mouth at bedtime.   . furosemide (LASIX) 20 MG tablet TAKE 1 TABLET (20 MG TOTAL) BY MOUTH EVERY OTHER DAY.  . methimazole (TAPAZOLE) 10 MG tablet TAKE 1 TABLET BY MOUTH DAILY  . pantoprazole (PROTONIX) 40 MG tablet Take 40 mg by mouth  daily.  . rosuvastatin (CRESTOR) 10 MG tablet TAKE 1 TABLET BY MOUTH EVERY DAY  . spironolactone (ALDACTONE) 25 MG tablet TAKE 1/2 TABLET BY MOUTH EVERY DAY  . SUMAtriptan (IMITREX) 100 MG tablet TAKE 1 TABLET BY MOUTH FOR 1 DOSE. MAY REPEAT IN 2 HOURS IF HEADACHE PERSISTS OR RECURS  . triamcinolone (NASACORT ALLERGY 24HR) 55 MCG/ACT AERO nasal inhaler Place 1 spray into the nose daily as needed (allergies).  . zonisamide (ZONEGRAN) 100 MG capsule TAKE 1 CAPSULE BY MOUTH EVERY  DAY  . methylPREDNISolone (MEDROL DOSEPAK) 4 MG TBPK tablet Medrol dose pack - take as directed -  6 day taper   No facility-administered encounter medications on file as of 05/11/2020.    Review of Systems  Constitutional: Negative for appetite change and unexpected weight change.  HENT: Negative for congestion and sinus pressure.   Respiratory: Negative for cough, chest tightness and shortness of breath.   Cardiovascular: Negative for chest pain, palpitations and leg swelling.  Gastrointestinal: Negative for abdominal pain, diarrhea, nausea and vomiting.  Genitourinary: Negative for difficulty urinating and dyspareunia.  Musculoskeletal: Negative for joint swelling and myalgias.       Left back/hip/leg pain as outlined.   Skin: Negative for color change and rash.  Neurological: Negative for dizziness, light-headedness and headaches.  Psychiatric/Behavioral: Negative for agitation and dysphoric mood.       Objective:    Physical Exam Vitals reviewed.  Constitutional:      General: She is not in acute distress.    Appearance: Normal appearance.  HENT:     Head: Normocephalic and atraumatic.     Right Ear: External ear normal.     Left Ear: External ear normal.  Eyes:     General: No scleral icterus.       Right eye: No discharge.        Left eye: No discharge.     Conjunctiva/sclera: Conjunctivae normal.  Neck:     Thyroid: No thyromegaly.  Cardiovascular:     Rate and Rhythm: Normal rate and regular  rhythm.  Pulmonary:     Effort: No respiratory distress.     Breath sounds: Normal breath sounds. No wheezing.  Abdominal:     General: Bowel sounds are normal.     Palpations: Abdomen is soft.     Tenderness: There is no abdominal tenderness.  Musculoskeletal:        General: No swelling or tenderness.     Cervical back: Neck supple. No tenderness.     Comments: No pain in groin with abduction/adduction.    Lymphadenopathy:     Cervical: No cervical adenopathy.  Skin:    Findings: No erythema or rash.  Neurological:     Mental Status: She is alert.  Psychiatric:        Mood and Affect: Mood normal.        Behavior: Behavior normal.     BP 110/72 (BP Location: Left Arm, Patient Position: Sitting)   Pulse 76   Temp 98 F (36.7 C)   Ht 5' 0.98" (1.549 m)   Wt 151 lb 12.8 oz (68.9 kg)   LMP 10/18/2012   SpO2 99%   BMI 28.70 kg/m  Wt Readings from Last 3 Encounters:  05/11/20 151 lb 12.8 oz (68.9 kg)  02/20/20 163 lb 6.4 oz (74.1 kg)  01/06/20 160 lb 6.4 oz (72.8 kg)     Lab Results  Component Value Date   WBC 7.4 05/11/2020   HGB 12.8 05/11/2020   HCT 40.0 05/11/2020   PLT 220.0 05/11/2020   GLUCOSE 93 05/11/2020   CHOL 122 05/11/2020   TRIG 85.0 05/11/2020   HDL 48.80 05/11/2020   LDLCALC 56 05/11/2020   ALT 13 05/11/2020   AST 20 05/11/2020   NA 140 05/11/2020   K 4.5 05/11/2020   CL 104 05/11/2020   CREATININE 0.86 05/11/2020   BUN 26 (H) 05/11/2020   CO2 27 05/11/2020   TSH 2.30 05/11/2020   INR 0.98 11/26/2015  HGBA1C 6.1 05/11/2020    MM 3D SCREEN BREAST BILATERAL  Result Date: 11/13/2019 CLINICAL DATA:  Screening. EXAM: DIGITAL SCREENING BILATERAL MAMMOGRAM WITH TOMO AND CAD COMPARISON:  Previous exam(s). ACR Breast Density Category b: There are scattered areas of fibroglandular density. FINDINGS: There are no findings suspicious for malignancy. Images were processed with CAD. IMPRESSION: No mammographic evidence of malignancy. A result letter  of this screening mammogram will be mailed directly to the patient. RECOMMENDATION: Screening mammogram in one year. (Code:SM-B-01Y) BI-RADS CATEGORY  1: Negative. Electronically Signed   By: Abelardo Diesel M.D.   On: 11/13/2019 15:44       Assessment & Plan:   Problem List Items Addressed This Visit    Vaginal prolapse    Seeing gyn. Pessary.        SVT (supraventricular tachycardia) (HCC) (Chronic)    Followed by cardiology. Doing well on carvedilol.  Follow.        Low back pain    Persistent low back pain as outlined.  Check L-S spine xray.        Relevant Medications   methylPREDNISolone (MEDROL DOSEPAK) 4 MG TBPK tablet   Other Relevant Orders   DG Lumbar Spine 2-3 Views (Completed)   Hyperthyroidism    Followed by endocrinology.  On tapazole.       Relevant Orders   TSH (Completed)   Hyperglycemia    Low carb diet and exercise.  Follow met b and a1c.       Relevant Orders   Hemoglobin A1c (Completed)   Hypercholesterolemia - Primary    On simvastatin.  Low cholesterol diet and exercise.  Follow lipid panel and liver function tests.       Relevant Orders   CBC with Differential/Platelet (Completed)   Hepatic function panel (Completed)   Lipid panel (Completed)   Basic metabolic panel (Completed)   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HCC) (Chronic)    Continue coreg.  No evidence of volume overload.  Breathing stable.  Continue f/u with cardiology.       Chronic systolic heart failure (HCC) (Chronic)    No evidence of volume overload.  Followed by cardiology.            Einar Pheasant, MD

## 2020-05-13 ENCOUNTER — Other Ambulatory Visit: Payer: Self-pay | Admitting: Internal Medicine

## 2020-05-13 NOTE — Telephone Encounter (Signed)
Pended for your approval or denial.  ° °

## 2020-05-14 ENCOUNTER — Telehealth: Payer: Self-pay | Admitting: Internal Medicine

## 2020-05-14 NOTE — Telephone Encounter (Signed)
Received request for imitrex.  Need to clarify how often she is using.  Just refilled 04/19/20.

## 2020-05-14 NOTE — Telephone Encounter (Signed)
Mailbox is full and could not accept any more messages.

## 2020-05-17 ENCOUNTER — Encounter: Payer: Self-pay | Admitting: Internal Medicine

## 2020-05-17 NOTE — Assessment & Plan Note (Signed)
Followed by cardiology. Doing well on carvedilol.  Follow.

## 2020-05-17 NOTE — Assessment & Plan Note (Signed)
Followed by endocrinology.  On tapazole.

## 2020-05-17 NOTE — Assessment & Plan Note (Signed)
No evidence of volume overload.  Followed by cardiology.

## 2020-05-17 NOTE — Assessment & Plan Note (Signed)
Seeing gyn. Pessary.

## 2020-05-17 NOTE — Assessment & Plan Note (Signed)
Continue coreg.  No evidence of volume overload.  Breathing stable.  Continue f/u with cardiology.

## 2020-05-17 NOTE — Assessment & Plan Note (Signed)
Persistent low back pain as outlined.  Check L-S spine xray.

## 2020-05-17 NOTE — Assessment & Plan Note (Signed)
Low carb diet and exercise.  Follow met b and a1c.  

## 2020-05-17 NOTE — Assessment & Plan Note (Signed)
On simvastatin.  Low cholesterol diet and exercise.  Follow lipid panel and liver function tests.   

## 2020-05-19 NOTE — Telephone Encounter (Signed)
Patient does not need this refill. Pharmacy requested.

## 2020-05-28 NOTE — Progress Notes (Signed)
Cardiology Office Note  Date:  05/31/2020   ID:  Kristi Greer, Kristi Greer Jun 15, 1951, MRN [id number]  PCP:  Kristi Pheasant, MD   Chief Complaint  Patient presents with  . OTHER    12 month f/u no complaints today. Meds reviewed verbally with pt.    HPI:  Kristi Greer is a 69 y/o woman with h/o obesity,  migraines  HTN  Nonischemic cardiomyopathy 08/2015 EF was 35 to 40% felt to be tachycardia mediated, Nonsustained VT Recovery and cardiac function, ejection fraction 55 to 60% April 2018 and 2019 Nonobstructive coronary disease on catheterization 10/2015 Who presents for follow-up of her cardiomyopathy  Active Cortisone to hip for arthritis earlier today No regular exercise program  Stress: husband with cancer, stage 4 Melanoma Fevers  BP low, no sx Denies any orthostasis symptoms Sometimes 80 systolic In fact her initial blood pressure measured 86, on my recheck was 119 systolic done carefully  Completed reverse shoulder surgery on the right Recovered well Dr. Roland Rack, oct 20th  2020  Echo 2019 Normal EF 50 to 41% moderate diastolic dysfunction.  EKG personally reviewed by myself on todays visit Shows normal sinus rhythm rate 68 bpm no significant ST or T wave changes  Other past medical history reviewed with her on today's visit January 2017 admitted [facility] with acute CHF and SVT (No palpitations at the time) Received adenosine but didn't break.  converted spontaneously.  Prior history of hyperthyroidism.  Treated with tapazole and a b-blocker.   Echo 4/18 EF 55-60% 30-d monitor 5/19:   - Normal sinus rhythm    - One 13-beat run NSVT on 5/12   - No other high-grade arrhythmias or blocks.    30-d monitor brief nonsustained VT for which she was asymptomatic. She has seen Dr. Rayann Heman. They discussed possible EPS but with normal EF have opted for medical therapy with carvedilol unless EF drops or has recurrent arrhythmias or worsening symptoms  cMRI 03/2016 LVEF  48% RV normal. No evidence of infiltrative disease.   On 10/11/15 had Myoview EF 30% No ischemia or scar.  Event monitor 4/17: NSR one 7 beat NSVT. No SVT  Echo 01/31/16 LVEF 40-45%, Grade 1 DD, Normal RV  Previous 30-day monitor in May 2019 One 13-beat run NSVT on 5/12   PMH:   has a past medical history of Allergy, Atrial fibrillation ([place]), Cancer ([place]), Cardiomyopathy, nonischemic ([place]), Carpal tunnel syndrome, Congestive heart failure ([place]), Dysrhythmia (08/31/2015), Environmental and seasonal allergies, Female cystocele, Hyperlipidemia, Hypertension, Hyperthyroidism, Migraines, Overactive detrusor, Personal history of colonic polyps, PONV (postoperative nausea and vomiting), SUI (stress urinary incontinence, female), Uterovaginal prolapse, UTI (urinary tract infection), and Vaginal atrophy.  PSH:    Past Surgical History:  Procedure Laterality Date  . ANTERIOR AND POSTERIOR VAGINAL REPAIR W/ SACROSPINOUS LIGAMENT SUSPENSION  10/18/2012  . CARDIAC CATHETERIZATION N/A 11/26/2015   Procedure: Right/Left Heart Cath and Coronary Angiography;  Surgeon: Jolaine Artist, MD;  Location: [place] [facility];  Service: Cardiovascular;  Laterality: N/A;  . CHOLECYSTECTOMY  2006  . COLONOSCOPY WITH PROPOFOL N/A 03/19/2017   Procedure: COLONOSCOPY WITH PROPOFOL;  Surgeon: Lollie Sails, MD;  Location: [facility] ENDOSCOPY;  Service: Endoscopy;  Laterality: N/A;  . COLPOPEXY  10/18/2012  . CYSTOURETHROSCOPY  10/18/2012  . ESOPHAGOGASTRODUODENOSCOPY (EGD) WITH PROPOFOL N/A 10/03/2016   Procedure: ESOPHAGOGASTRODUODENOSCOPY (EGD) WITH PROPOFOL;  Surgeon: Lollie Sails, MD;  Location: [facility] ENDOSCOPY;  Service: Endoscopy;  Laterality: N/A;  . EYE SURGERY     cataract extration  .  FINGER SURGERY    . HYSTEROSCOPY  2011, 10/2010  . OOPHORECTOMY    . REVERSE SHOULDER ARTHROPLASTY Right 06/17/2019   Procedure: REVERSE SHOULDER ARTHROPLASTY;  Surgeon: Corky Mull, MD;  Location: [facility] ORS;  Service:  Orthopedics;  Laterality: Right;  . TONSILLECTOMY    . TUBAL LIGATION    . VAGINAL HYSTERECTOMY     midurethral sling    Current Outpatient Medications  Medication Sig Dispense Refill  . acetaminophen (TYLENOL) 500 MG tablet Take 500-1,000 mg by mouth every 6 (six) hours as needed for moderate pain or headache.    . ARTIFICIAL TEAR SOLUTION OP Place 1 drop into both eyes daily as needed (dry eyes).    . calcium carbonate (OSCAL) 1500 (600 Ca) MG TABS tablet Take 1,500 mg by mouth daily.     . calcium carbonate (TUMS - DOSED IN MG ELEMENTAL CALCIUM) 500 MG chewable tablet Chew 1 tablet by mouth daily as needed for indigestion or heartburn.    . carvedilol (COREG) 12.5 MG tablet Take 1 tablet (12.5 mg total) by mouth 2 (two) times daily with a meal. 180 tablet 0  . Cholecalciferol 1000 units capsule Take 1,000 Units by mouth daily.    . diphenhydramine-acetaminophen (TYLENOL PM) 25-500 MG TABS tablet Take 1 tablet by mouth at bedtime as needed (sleep).     . fexofenadine (ALLEGRA) 180 MG tablet Take 180 mg by mouth at bedtime.     . furosemide (LASIX) 20 MG tablet TAKE 1 TABLET (20 MG TOTAL) BY MOUTH EVERY OTHER DAY. 45 tablet 3  . methimazole (TAPAZOLE) 10 MG tablet TAKE 1 TABLET BY MOUTH DAILY (Patient taking differently: 5 mg. ) 90 tablet 3  . pantoprazole (PROTONIX) 40 MG tablet Take 40 mg by mouth daily.    . rosuvastatin (CRESTOR) 10 MG tablet TAKE 1 TABLET BY MOUTH EVERY DAY 90 tablet 1  . spironolactone (ALDACTONE) 25 MG tablet TAKE 1/2 TABLET BY MOUTH EVERY DAY 45 tablet 3  . SUMAtriptan (IMITREX) 100 MG tablet TAKE 1 TABLET BY MOUTH FOR 1 DOSE. MAY REPEAT IN 2 HOURS IF HEADACHE PERSISTS OR RECURS 9 tablet 0  . triamcinolone (NASACORT ALLERGY 24HR) 55 MCG/ACT AERO nasal inhaler Place 1 spray into the nose daily as needed (allergies).    . zonisamide (ZONEGRAN) 100 MG capsule TAKE 1 CAPSULE BY MOUTH EVERY DAY 90 capsule 0   No current facility-administered medications for this visit.      Allergies:   Patient has no known allergies.   Social History:  The patient  reports that she has never smoked. She has never used smokeless tobacco. She reports current alcohol use. She reports that she does not use drugs.   Family History:   family history includes Diabetes in her mother; Heart disease in her father; Hypercholesterolemia in her father; Hypertension in her father and mother; Stroke in her father and mother.    Review of Systems: Review of Systems  Constitutional: Negative.   HENT: Negative.   Respiratory: Negative.   Cardiovascular: Negative.   Gastrointestinal: Negative.   Musculoskeletal: Negative.   Neurological: Negative.   Psychiatric/Behavioral: Negative.   All other systems reviewed and are negative.   PHYSICAL EXAM: VS:  BP 100/70 (BP Location: Left Arm, Patient Position: Sitting, Cuff Size: Normal)   Pulse 68   Ht 5\' 1"  (1.549 m)   Wt 150 lb 4 oz (68.2 kg)   LMP 10/18/2012   SpO2 98%   BMI 28.39 kg/m  , BMI  Body mass index is 28.39 kg/m. Constitutional:  oriented to person, place, and time. No distress.  HENT:  Head: Grossly normal Eyes:  no discharge. No scleral icterus.  Neck: No JVD, no carotid bruits  Cardiovascular: Regular rate and rhythm, no murmurs appreciated Pulmonary/Chest: Clear to auscultation bilaterally, no wheezes or rails Abdominal: Soft.  no distension.  no tenderness.  Musculoskeletal: Normal range of motion Neurological:  normal muscle tone. Coordination normal. No atrophy Skin: Skin warm and dry Psychiatric: normal affect, pleasant   Recent Labs: 05/11/2020: ALT 13; BUN 26; Creatinine, Ser 0.86; Hemoglobin 12.8; Platelets 220.0; Potassium 4.5; Sodium 140; TSH 2.30    Lipid Panel Lab Results  Component Value Date   CHOL 122 05/11/2020   HDL 48.80 05/11/2020   LDLCALC 56 05/11/2020   TRIG 85.0 05/11/2020      Wt Readings from Last 3 Encounters:  05/31/20 150 lb 4 oz (68.2 kg)  05/11/20 151 lb 12.8 oz  (68.9 kg)  02/20/20 163 lb 6.4 oz (74.1 kg)     ASSESSMENT AND PLAN:   NSVT (nonsustained ventricular tachycardia) (HCC) Tolerating carvedilol 12.5 twice daily Prior telemetry monitor 2019 with rare episode of nonsustained VT  NICM (nonischemic cardiomyopathy) ([place]) echocardiogram showing normalized ejection fraction Chronically low blood pressure, relatively asymptomatic Suggested she stay hydrated, for low numbers may need to hold the spironolactone  Chronic systolic heart failure ([place]) - Plan: EKG 12-Lead Continue current medications, euvolemic Ejection fraction 50 to 55% 2019, Symptoms resolved, doing well in general  SVT (supraventricular tachycardia) ([place]) Denies tachycardia palpitations  continue Coreg 12.5 twice daily    Total encounter time more than 25 minutes  Greater than 50% was spent in counseling and coordination of care with the patient     Signed, Esmond Plants, M.D., Ph.D. 05/31/2020  [place], [place] [phone]

## 2020-05-31 ENCOUNTER — Encounter: Payer: Self-pay | Admitting: Cardiovascular Disease

## 2020-05-31 ENCOUNTER — Ambulatory Visit (INDEPENDENT_AMBULATORY_CARE_PROVIDER_SITE_OTHER): Payer: Medicare Other | Admitting: Cardiovascular Disease

## 2020-05-31 ENCOUNTER — Other Ambulatory Visit: Payer: Self-pay

## 2020-05-31 VITALS — BP 100/70 | HR 68 | Ht 61.0 in | Wt 150.2 lb

## 2020-05-31 DIAGNOSIS — I471 Supraventricular tachycardia: Secondary | ICD-10-CM | POA: Diagnosis not present

## 2020-05-31 DIAGNOSIS — M7062 Trochanteric bursitis, left hip: Secondary | ICD-10-CM | POA: Diagnosis not present

## 2020-05-31 DIAGNOSIS — I5022 Chronic systolic (congestive) heart failure: Secondary | ICD-10-CM | POA: Diagnosis not present

## 2020-05-31 DIAGNOSIS — I428 Other cardiomyopathies: Secondary | ICD-10-CM | POA: Diagnosis not present

## 2020-05-31 DIAGNOSIS — I4729 Other ventricular tachycardia: Secondary | ICD-10-CM

## 2020-05-31 DIAGNOSIS — I472 Ventricular tachycardia: Secondary | ICD-10-CM | POA: Diagnosis not present

## 2020-05-31 NOTE — Patient Instructions (Addendum)
Monitor blood pressure, If it runs low, drink some fluids If it stays low, we may have to hold the spironolactone  Make sure to check Blood Pressures 2 hours after your medications.   Please keep a log of your readings so we can see how they trend.  Avoid these things for 30 minutes before checking your blood pressure:  Drinking caffeine.  Drinking alcohol.  Eating.  Smoking.  Exercising.  Five minutes before checking your blood pressure:  Pee.  Sit in a dining chair. Avoid sitting in a soft couch or armchair.  Be quiet. Do not talk.  Medication Instructions:  No changes  If you need a refill on your cardiac medications before your next appointment, please call your pharmacy.    Lab work: No new labs needed   If you have labs (blood work) drawn today and your tests are completely normal, you will receive your results only by: [facility] MyChart Message (if you have MyChart) OR . A paper copy in the mail If you have any lab test that is abnormal or we need to change your treatment, we will call you to review the results.   Testing/Procedures: No new testing needed   Follow-Up: At [facility], you and your health needs are our priority.  As part of our continuing mission to provide you with exceptional heart care, we have created designated Provider Care Teams.  These Care Teams include your primary Cardiologist (physician) and Advanced Practice Providers (APPs -  Physician Assistants and Nurse Practitioners) who all work together to provide you with the care you need, when you need it.  . You will need a follow up appointment in 12 months  . Providers on your designated Care Team:   . Murray Hodgkins, NP . Christell Faith, PA-C . Marrianne Mood, PA-C  Any Other Special Instructions Will Be Listed Below (If Applicable).  COVID-19 Vaccine Information can be found at: [email or web address].uk For questions related to  vaccine distribution or appointments, please email [email or web address] or call [phone].

## 2020-07-21 ENCOUNTER — Other Ambulatory Visit: Payer: Self-pay | Admitting: Internal Medicine

## 2020-07-30 ENCOUNTER — Other Ambulatory Visit: Payer: Self-pay | Admitting: Internal Medicine

## 2020-09-13 ENCOUNTER — Ambulatory Visit: Payer: Medicare Other | Admitting: Internal Medicine

## 2020-09-16 ENCOUNTER — Other Ambulatory Visit: Payer: Self-pay | Admitting: Internal Medicine

## 2020-09-19 ENCOUNTER — Encounter: Payer: Self-pay | Admitting: Internal Medicine

## 2020-09-20 ENCOUNTER — Telehealth (INDEPENDENT_AMBULATORY_CARE_PROVIDER_SITE_OTHER): Payer: Medicare Other | Admitting: Internal Medicine

## 2020-09-20 ENCOUNTER — Other Ambulatory Visit: Payer: Self-pay

## 2020-09-20 DIAGNOSIS — F439 Reaction to severe stress, unspecified: Secondary | ICD-10-CM | POA: Diagnosis not present

## 2020-09-20 DIAGNOSIS — I5022 Chronic systolic (congestive) heart failure: Secondary | ICD-10-CM | POA: Diagnosis not present

## 2020-09-20 DIAGNOSIS — R519 Headache, unspecified: Secondary | ICD-10-CM | POA: Diagnosis not present

## 2020-09-20 MED ORDER — SUMATRIPTAN SUCCINATE 100 MG PO TABS: ORAL_TABLET | ORAL | 0 refills | Status: DC

## 2020-09-20 NOTE — Telephone Encounter (Signed)
I have sent in the prescription for her sumatriptan.  If she is having increased stress, headaches, etc - can schedule a virtual visit to discuss.  May need something more to help with stress.

## 2020-09-20 NOTE — Telephone Encounter (Signed)
LMTCB

## 2020-09-20 NOTE — Telephone Encounter (Signed)
Pt has lost her husband. She has been scheduled for a VV with Dr Nicki Reaper this PM

## 2020-09-20 NOTE — Progress Notes (Signed)
[name], female   DOB: 20-Sep-1950, 70 y.o.   MRN: [id number]   Virtual Visit via video Note  This visit type was conducted due to national recommendations for restrictions regarding the COVID-19 pandemic (e.g. social distancing).  This format is felt to be most appropriate for this patient at this time.  All issues noted in this document were discussed and addressed.  No physical exam was performed (except for noted visual exam findings with Video Visits).   I connected with Kristi Greer by a video enabled telemedicine application and verified that I am speaking with the correct person using two identifiers. Location patient: home Location provider: work Persons participating in the virtual visit: patient, provider  The limitations, risks, security and privacy concerns of performing an evaluation and management service by video and the availability of in person appointments have been discussed.  It has also been discussed with the patient that there may be a patient responsible charge related to this service. The patient expressed understanding and agreed to proceed.   Reason for visit: work in appt  HPI: Work in appt to discuss increased stress.  Husband has just recently passed.  Increased stress.  Has been having increased headaches recently.  Taking imitrex.  Took 1/2 the f/u 1/2 today.  Has taken 1/2 tablet q day for three days prior.  Feels the stress is triggering.  Feels similar to previous headaches.  No change in headaches.  Breathing stable.  No chest pain. Has good support from her family.     ROS: See pertinent positives and negatives per HPI.  Past Medical History:  Diagnosis Date  . Allergy   . Atrial fibrillation ([place])   . Cancer ([place])    basal cell  . Cardiomyopathy, nonischemic ([place])   . Carpal tunnel syndrome    bilateral  . Congestive heart failure ([place])    normalization of EF with medical therapy  . Dysrhythmia 08/31/2015   long RP  tachycardia  . Environmental and seasonal allergies   . Female cystocele   . Hyperlipidemia   . Hypertension   . Hyperthyroidism   . Migraines   . Overactive detrusor   . Personal history of colonic polyps   . PONV (postoperative nausea and vomiting)   . SUI (stress urinary incontinence, female)   . Uterovaginal prolapse   . UTI (urinary tract infection)   . Vaginal atrophy     Past Surgical History:  Procedure Laterality Date  . ANTERIOR AND POSTERIOR VAGINAL REPAIR W/ SACROSPINOUS LIGAMENT SUSPENSION  10/18/2012  . CARDIAC CATHETERIZATION N/A 11/26/2015   Procedure: Right/Left Heart Cath and Coronary Angiography;  Surgeon: Jolaine Artist, MD;  Location: [facility];  Service: Cardiovascular;  Laterality: N/A;  . CHOLECYSTECTOMY  2006  . COLONOSCOPY WITH PROPOFOL N/A 03/19/2017   Procedure: COLONOSCOPY WITH PROPOFOL;  Surgeon: Lollie Sails, MD;  Location: [facility] ENDOSCOPY;  Service: Endoscopy;  Laterality: N/A;  . COLPOPEXY  10/18/2012  . CYSTOURETHROSCOPY  10/18/2012  . ESOPHAGOGASTRODUODENOSCOPY (EGD) WITH PROPOFOL N/A 10/03/2016   Procedure: ESOPHAGOGASTRODUODENOSCOPY (EGD) WITH PROPOFOL;  Surgeon: Lollie Sails, MD;  Location: [facility] ENDOSCOPY;  Service: Endoscopy;  Laterality: N/A;  . EYE SURGERY     cataract extration  . FINGER SURGERY    . HYSTEROSCOPY  2011, 10/2010  . OOPHORECTOMY    . REVERSE SHOULDER ARTHROPLASTY Right 06/17/2019   Procedure: REVERSE SHOULDER ARTHROPLASTY;  Surgeon: Corky Mull, MD;  Location: [facility] ORS;  Service: Orthopedics;  Laterality: Right;  . TONSILLECTOMY    . TUBAL LIGATION    . VAGINAL HYSTERECTOMY     midurethral sling    Family History  Problem Relation Age of Onset  . Hypertension Mother   . Stroke Mother   . Diabetes Mother   . Hypercholesterolemia Father   . Heart disease Father        MI  . Hypertension Father   . Stroke Father   . Breast cancer Neg Hx   . Colon cancer Neg Hx   . Thyroid disease Neg Hx      SOCIAL HX: reviewed.    Current Outpatient Medications:  .  acetaminophen (TYLENOL) 500 MG tablet, Take 500-1,000 mg by mouth every 6 (six) hours as needed for moderate pain or headache., Disp: , Rfl:  .  ARTIFICIAL TEAR SOLUTION OP, Place 1 drop into both eyes daily as needed (dry eyes)., Disp: , Rfl:  .  calcium carbonate (OSCAL) 1500 (600 Ca) MG TABS tablet, Take 1,500 mg by mouth daily. , Disp: , Rfl:  .  calcium carbonate (TUMS - DOSED IN MG ELEMENTAL CALCIUM) 500 MG chewable tablet, Chew 1 tablet by mouth daily as needed for indigestion or heartburn., Disp: , Rfl:  .  carvedilol (COREG) 12.5 MG tablet, Take 1 tablet (12.5 mg total) by mouth 2 (two) times daily with a meal., Disp: 180 tablet, Rfl: 0 .  Cholecalciferol 1000 units capsule, Take 1,000 Units by mouth daily., Disp: , Rfl:  .  diphenhydramine-acetaminophen (TYLENOL PM) 25-500 MG TABS tablet, Take 1 tablet by mouth at bedtime as needed (sleep). , Disp: , Rfl:  .  fexofenadine (ALLEGRA) 180 MG tablet, Take 180 mg by mouth at bedtime. , Disp: , Rfl:  .  furosemide (LASIX) 20 MG tablet, TAKE 1 TABLET (20 MG TOTAL) BY MOUTH EVERY OTHER DAY., Disp: 45 tablet, Rfl: 3 .  methimazole (TAPAZOLE) 10 MG tablet, TAKE 1 TABLET BY MOUTH DAILY (Patient taking differently: 5 mg. ), Disp: 90 tablet, Rfl: 3 .  pantoprazole (PROTONIX) 40 MG tablet, Take 40 mg by mouth daily., Disp: , Rfl:  .  rosuvastatin (CRESTOR) 10 MG tablet, TAKE 1 TABLET BY MOUTH EVERY DAY, Disp: 90 tablet, Rfl: 1 .  spironolactone (ALDACTONE) 25 MG tablet, TAKE 1/2 TABLET BY MOUTH EVERY DAY, Disp: 45 tablet, Rfl: 3 .  SUMAtriptan (IMITREX) 100 MG tablet, TAKE 1 TABLET BY MOUTH FOR 1 DOSE. MAY REPEAT IN 2 HOURS IF HEADACHE PERSISTS OR RECURS, Disp: 9 tablet, Rfl: 0 .  triamcinolone (NASACORT ALLERGY 24HR) 55 MCG/ACT AERO nasal inhaler, Place 1 spray into the nose daily as needed (allergies)., Disp: , Rfl:  .  zonisamide (ZONEGRAN) 100 MG capsule, TAKE 1 CAPSULE BY MOUTH  EVERY DAY, Disp: 90 capsule, Rfl: 0  EXAM:  GENERAL: alert. Sounds to be in no acute distress.  Answering questions appropriately.   PSYCH/NEURO: pleasant and cooperative, no obvious depression or anxiety, speech and thought processing grossly intact  ASSESSMENT AND PLAN:  Discussed the following assessment and plan:  Problem List Items Addressed This Visit    Chronic systolic heart failure (HCC) (Chronic)    No evidence of volume overload.  Continue spironolactone, lasix and carvedilol.        Headache    Has a history of headaches.  No change in headache.  Feels stress is triggering. Discussed possible rebound headaches.  Slept better last night.  Hold on additional medication.  Follow.       Stress  Increased stress as outlined. Discussed.  Has good support.  Discussed medication to help level things out.  Follow.  Notify me if desires any further intervention.           I discussed the assessment and treatment plan with the patient. The patient was provided an opportunity to ask questions and all were answered. The patient agreed with the plan and demonstrated an understanding of the instructions.   The patient was advised to call back or seek an in-person evaluation if the symptoms worsen or if the condition fails to improve as anticipated.  I provided 23 minutes of non-face-to-face time during this encounter.   Einar Pheasant, MD

## 2020-09-25 ENCOUNTER — Encounter: Payer: Self-pay | Admitting: Internal Medicine

## 2020-09-25 DIAGNOSIS — F439 Reaction to severe stress, unspecified: Secondary | ICD-10-CM | POA: Insufficient documentation

## 2020-09-25 DIAGNOSIS — R519 Headache, unspecified: Secondary | ICD-10-CM | POA: Insufficient documentation

## 2020-09-25 NOTE — Assessment & Plan Note (Signed)
Increased stress as outlined. Discussed.  Has good support.  Discussed medication to help level things out.  Follow.  Notify me if desires any further intervention.

## 2020-09-25 NOTE — Assessment & Plan Note (Signed)
Has a history of headaches.  No change in headache.  Feels stress is triggering. Discussed possible rebound headaches.  Slept better last night.  Hold on additional medication.  Follow.

## 2020-09-25 NOTE — Assessment & Plan Note (Signed)
No evidence of volume overload.  Continue spironolactone, lasix and carvedilol.

## 2020-09-28 DIAGNOSIS — L570 Actinic keratosis: Secondary | ICD-10-CM | POA: Diagnosis not present

## 2020-09-28 DIAGNOSIS — D485 Neoplasm of uncertain behavior of skin: Secondary | ICD-10-CM | POA: Diagnosis not present

## 2020-09-28 DIAGNOSIS — C44729 Squamous cell carcinoma of skin of left lower limb, including hip: Secondary | ICD-10-CM | POA: Diagnosis not present

## 2020-09-29 ENCOUNTER — Other Ambulatory Visit: Payer: Self-pay

## 2020-09-29 ENCOUNTER — Ambulatory Visit (INDEPENDENT_AMBULATORY_CARE_PROVIDER_SITE_OTHER): Payer: Medicare Other | Admitting: Internal Medicine

## 2020-09-29 ENCOUNTER — Encounter: Payer: Self-pay | Admitting: Internal Medicine

## 2020-09-29 VITALS — BP 110/72 | HR 70 | Temp 97.7°F | Resp 16 | Ht 61.0 in | Wt 147.2 lb

## 2020-09-29 DIAGNOSIS — I5022 Chronic systolic (congestive) heart failure: Secondary | ICD-10-CM

## 2020-09-29 DIAGNOSIS — G43809 Other migraine, not intractable, without status migrainosus: Secondary | ICD-10-CM

## 2020-09-29 DIAGNOSIS — I420 Dilated cardiomyopathy: Secondary | ICD-10-CM | POA: Diagnosis not present

## 2020-09-29 DIAGNOSIS — K219 Gastro-esophageal reflux disease without esophagitis: Secondary | ICD-10-CM

## 2020-09-29 DIAGNOSIS — I472 Ventricular tachycardia: Secondary | ICD-10-CM

## 2020-09-29 DIAGNOSIS — I4729 Other ventricular tachycardia: Secondary | ICD-10-CM

## 2020-09-29 DIAGNOSIS — F439 Reaction to severe stress, unspecified: Secondary | ICD-10-CM

## 2020-09-29 DIAGNOSIS — E7800 Pure hypercholesterolemia, unspecified: Secondary | ICD-10-CM

## 2020-09-29 DIAGNOSIS — Z23 Encounter for immunization: Secondary | ICD-10-CM | POA: Diagnosis not present

## 2020-09-29 DIAGNOSIS — R739 Hyperglycemia, unspecified: Secondary | ICD-10-CM

## 2020-09-29 LAB — BASIC METABOLIC PANEL
BUN: 16 mg/dL (ref 6–23)
CO2: 31 mEq/L (ref 19–32)
Calcium: 9.5 mg/dL (ref 8.4–10.5)
Chloride: 107 mEq/L (ref 96–112)
Creatinine, Ser: 0.88 mg/dL (ref 0.40–1.20)
GFR: 67.83 mL/min (ref >60.00)
Glucose, Bld: 89 mg/dL (ref 70–99)
Potassium: 4.1 mEq/L (ref 3.5–5.1)
Sodium: 143 mEq/L (ref 135–145)

## 2020-09-29 LAB — HEPATIC FUNCTION PANEL
ALT: 11 U/L (ref 0–35)
AST: 15 U/L (ref 0–37)
Albumin: 3.9 g/dL (ref 3.5–5.2)
Alkaline Phosphatase: 97 U/L (ref 39–117)
Bilirubin, Direct: 0.1 mg/dL (ref 0.0–0.3)
Total Bilirubin: 0.5 mg/dL (ref 0.2–1.2)
Total Protein: 6.1 g/dL (ref 6.0–8.3)

## 2020-09-29 LAB — LIPID PANEL
Cholesterol: 142 mg/dL (ref 0–200)
HDL: 52.4 mg/dL (ref >39.00)
LDL Cholesterol: 71 mg/dL (ref 0–99)
NonHDL: 89.47
Total CHOL/HDL Ratio: 3
Triglycerides: 91 mg/dL (ref 0.0–149.0)
VLDL: 18.2 mg/dL (ref 0.0–40.0)

## 2020-09-29 LAB — HEMOGLOBIN A1C: Hgb A1c MFr Bld: 6.1 % (ref 4.6–6.5)

## 2020-09-29 NOTE — Progress Notes (Signed)
[name], female   DOB: 03-11-1951, 70 y.o.   MRN: [id number]   Subjective:    [name], female    DOB: 10-10-1950, 70 y.o.   MRN: [id number]  HPI This visit occurred during the SARS-CoV-2 public health emergency.  Safety protocols were in place, including screening questions prior to the visit, additional usage of staff PPE, and extensive cleaning of exam room while observing appropriate contact time as indicated for disinfecting solutions.  Patient here for a scheduled follow up.  Follow up regarding hypertension, CHF and hypercholesterolemia.  Also increased stress.  Husband recently passed. Has good support.  Does not feel needs anything more at this time.  Trying to stay active.  No chest pain.  Breathing stable.  Holding spironolactone.  Blood pressure doing well. Seeing cardiology for f/u CHF and NSVT.  Stable.     Past Medical History:  Diagnosis Date  . Allergy   . Atrial fibrillation ([place])   . Cancer ([place])    basal cell  . Cardiomyopathy, nonischemic ([place])   . Carpal tunnel syndrome    bilateral  . Congestive heart failure ([place])    normalization of EF with medical therapy  . Dysrhythmia 08/31/2015   long RP tachycardia  . Environmental and seasonal allergies   . Female cystocele   . Hyperlipidemia   . Hypertension   . Hyperthyroidism   . Migraines   . Overactive detrusor   . Personal history of colonic polyps   . PONV (postoperative nausea and vomiting)   . SUI (stress urinary incontinence, female)   . Uterovaginal prolapse   . UTI (urinary tract infection)   . Vaginal atrophy    Past Surgical History:  Procedure Laterality Date  . ANTERIOR AND POSTERIOR VAGINAL REPAIR W/ SACROSPINOUS LIGAMENT SUSPENSION  10/18/2012  . CARDIAC CATHETERIZATION N/A 11/26/2015   Procedure: Right/Left Heart Cath and Coronary Angiography;  Surgeon: Jolaine Artist, MD;  Location: [facility];  Service: Cardiovascular;  Laterality: N/A;   . CHOLECYSTECTOMY  2006  . COLONOSCOPY WITH PROPOFOL N/A 03/19/2017   Procedure: COLONOSCOPY WITH PROPOFOL;  Surgeon: Lollie Sails, MD;  Location: [facility] ENDOSCOPY;  Service: Endoscopy;  Laterality: N/A;  . COLPOPEXY  10/18/2012  . CYSTOURETHROSCOPY  10/18/2012  . ESOPHAGOGASTRODUODENOSCOPY (EGD) WITH PROPOFOL N/A 10/03/2016   Procedure: ESOPHAGOGASTRODUODENOSCOPY (EGD) WITH PROPOFOL;  Surgeon: Lollie Sails, MD;  Location: [facility] ENDOSCOPY;  Service: Endoscopy;  Laterality: N/A;  . EYE SURGERY     cataract extration  . FINGER SURGERY    . HYSTEROSCOPY  2011, 10/2010  . OOPHORECTOMY    . REVERSE SHOULDER ARTHROPLASTY Right 06/17/2019   Procedure: REVERSE SHOULDER ARTHROPLASTY;  Surgeon: Corky Mull, MD;  Location: [facility] ORS;  Service: Orthopedics;  Laterality: Right;  . TONSILLECTOMY    . TUBAL LIGATION    . VAGINAL HYSTERECTOMY     midurethral sling   Family History  Problem Relation Age of Onset  . Hypertension Mother   . Stroke Mother   . Diabetes Mother   . Hypercholesterolemia Father   . Heart disease Father        MI  . Hypertension Father   . Stroke Father   . Breast cancer Neg Hx   . Colon cancer Neg Hx   . Thyroid disease Neg Hx    Social History   Socioeconomic History  . Marital status: Married    Spouse name: Not on file  . Number of children:  2  . Years of education: Not on file  . Highest education level: Not on file  Occupational History  . Not on file  Tobacco Use  . Smoking status: Never Smoker  . Smokeless tobacco: Never Used  Vaping Use  . Vaping Use: Never used  Substance and Sexual Activity  . Alcohol use: Yes    Alcohol/week: 0.0 standard drinks    Comment: Socially  . Drug use: No  . Sexual activity: Not on file  Other Topics Concern  . Not on file  Social History Narrative  . Not on file   Social Determinants of Health   Financial Resource Strain: Not on file  Food Insecurity: Not on file  Transportation Needs: Not on file   Physical Activity: Not on file  Stress: Not on file  Social Connections: Not on file    Outpatient Encounter Medications as of 09/29/2020  Medication Sig  . acetaminophen (TYLENOL) 500 MG tablet Take 500-1,000 mg by mouth every 6 (six) hours as needed for moderate pain or headache.  . ARTIFICIAL TEAR SOLUTION OP Place 1 drop into both eyes daily as needed (dry eyes).  . calcium carbonate (OSCAL) 1500 (600 Ca) MG TABS tablet Take 1,500 mg by mouth daily.   . calcium carbonate (TUMS - DOSED IN MG ELEMENTAL CALCIUM) 500 MG chewable tablet Chew 1 tablet by mouth daily as needed for indigestion or heartburn.  . carvedilol (COREG) 12.5 MG tablet Take 1 tablet (12.5 mg total) by mouth 2 (two) times daily with a meal.  . Cholecalciferol 1000 units capsule Take 1,000 Units by mouth daily.  . diphenhydramine-acetaminophen (TYLENOL PM) 25-500 MG TABS tablet Take 1 tablet by mouth at bedtime as needed (sleep).   . fexofenadine (ALLEGRA) 180 MG tablet Take 180 mg by mouth at bedtime.   . furosemide (LASIX) 20 MG tablet TAKE 1 TABLET (20 MG TOTAL) BY MOUTH EVERY OTHER DAY.  . methimazole (TAPAZOLE) 10 MG tablet TAKE 1 TABLET BY MOUTH DAILY (Patient taking differently: 5 mg. )  . pantoprazole (PROTONIX) 40 MG tablet Take 40 mg by mouth daily.  . rosuvastatin (CRESTOR) 10 MG tablet TAKE 1 TABLET BY MOUTH EVERY DAY  . spironolactone (ALDACTONE) 25 MG tablet TAKE 1/2 TABLET BY MOUTH EVERY DAY  . SUMAtriptan (IMITREX) 100 MG tablet TAKE 1 TABLET BY MOUTH FOR 1 DOSE. MAY REPEAT IN 2 HOURS IF HEADACHE PERSISTS OR RECURS  . triamcinolone (NASACORT ALLERGY 24HR) 55 MCG/ACT AERO nasal inhaler Place 1 spray into the nose daily as needed (allergies).  . zonisamide (ZONEGRAN) 100 MG capsule TAKE 1 CAPSULE BY MOUTH EVERY DAY   No facility-administered encounter medications on file as of 09/29/2020.    Review of Systems  Constitutional: Negative for appetite change and unexpected weight change.  HENT: Negative for  congestion and sinus pressure.   Respiratory: Negative for cough, chest tightness and shortness of breath.   Cardiovascular: Negative for chest pain, palpitations and leg swelling.  Gastrointestinal: Negative for abdominal pain, diarrhea, nausea and vomiting.  Genitourinary: Negative for difficulty urinating and dysuria.  Musculoskeletal: Negative for joint swelling and myalgias.  Skin: Negative for color change and rash.  Neurological: Negative for dizziness, light-headedness and headaches.  Psychiatric/Behavioral: Negative for agitation and dysphoric mood.       Objective:    Physical Exam Vitals reviewed.  Constitutional:      General: She is not in acute distress.    Appearance: Normal appearance.  HENT:     Head: Normocephalic  and atraumatic.     Right Ear: External ear normal.     Left Ear: External ear normal.     Mouth/Throat:     Mouth: Oropharynx is clear and moist.  Eyes:     General: No scleral icterus.       Right eye: No discharge.        Left eye: No discharge.     Conjunctiva/sclera: Conjunctivae normal.  Neck:     Thyroid: No thyromegaly.  Cardiovascular:     Rate and Rhythm: Normal rate and regular rhythm.  Pulmonary:     Effort: No respiratory distress.     Breath sounds: Normal breath sounds. No wheezing.  Abdominal:     General: Bowel sounds are normal.     Palpations: Abdomen is soft.     Tenderness: There is no abdominal tenderness.  Musculoskeletal:        General: No swelling, tenderness or edema.     Cervical back: Neck supple. No tenderness.  Lymphadenopathy:     Cervical: No cervical adenopathy.  Skin:    Findings: No erythema or rash.  Neurological:     Mental Status: She is alert.  Psychiatric:        Mood and Affect: Mood normal.        Behavior: Behavior normal.     BP 110/72   Pulse 70   Temp 97.7 F (36.5 C) (Oral)   Resp 16   Ht 5' 1"  (1.549 m)   Wt 147 lb 3.2 oz (66.8 kg)   LMP 10/18/2012   SpO2 99%   BMI 27.81 kg/m   Wt Readings from Last 3 Encounters:  09/29/20 147 lb 3.2 oz (66.8 kg)  05/31/20 150 lb 4 oz (68.2 kg)  05/11/20 151 lb 12.8 oz (68.9 kg)     Lab Results  Component Value Date   WBC 7.4 05/11/2020   HGB 12.8 05/11/2020   HCT 40.0 05/11/2020   PLT 220.0 05/11/2020   GLUCOSE 89 09/29/2020   CHOL 142 09/29/2020   TRIG 91.0 09/29/2020   HDL 52.40 09/29/2020   LDLCALC 71 09/29/2020   ALT 11 09/29/2020   AST 15 09/29/2020   NA 143 09/29/2020   K 4.1 09/29/2020   CL 107 09/29/2020   CREATININE 0.88 09/29/2020   BUN 16 09/29/2020   CO2 31 09/29/2020   TSH 2.30 05/11/2020   INR 0.98 11/26/2015   HGBA1C 6.1 09/29/2020    MM 3D SCREEN BREAST BILATERAL  Result Date: 11/13/2019 CLINICAL DATA:  Screening. EXAM: DIGITAL SCREENING BILATERAL MAMMOGRAM WITH TOMO AND CAD COMPARISON:  Previous exam(s). ACR Breast Density Category b: There are scattered areas of fibroglandular density. FINDINGS: There are no findings suspicious for malignancy. Images were processed with CAD. IMPRESSION: No mammographic evidence of malignancy. A result letter of this screening mammogram will be mailed directly to the patient. RECOMMENDATION: Screening mammogram in one year. (Code:SM-B-01Y) BI-RADS CATEGORY  1: Negative. Electronically Signed   By: Abelardo Diesel M.D.   On: 11/13/2019 15:44       Assessment & Plan:   Problem List Items Addressed This Visit    Chronic systolic heart failure (HCC) (Chronic)    Continue spironolactone, coreg and lasix.  No evidence of volume overload.  Follow.       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HCC) (Chronic)    No evidence of volume overload.  On coreg.  Stable.  Follow.        GERD (gastroesophageal reflux disease)  Upper symptoms controlled on protonix.        Hypercholesterolemia    On simvastatin.  Low cholesterol diet and exercise.  Follow lipid panel and liver function tests.        Relevant Orders   Hepatic function panel (Completed)   Lipid panel  (Completed)   Hyperglycemia - Primary    Low carb diet and exercise.  Follow met b and a1c.       Relevant Orders   Hemoglobin A1c (Completed)   Basic metabolic panel (Completed)   Migraines    Stable.  Follow.       NSVT (nonsustained ventricular tachycardia) (HCC) (Chronic)    On coreg.  No increased heart rate or palpitations.  Follow.       Stress    Increased stress as outlined.  Husband recently passed.  Discussed with her today.  She has good support. Does not feel needs any further intervention at this time.  Follow.         Other Visit Diagnoses    Need for immunization against influenza       Relevant Orders   Flu Vaccine QUAD High Dose(Fluad) (Completed)       Einar Pheasant, MD

## 2020-10-03 ENCOUNTER — Encounter: Payer: Self-pay | Admitting: Internal Medicine

## 2020-10-03 NOTE — Assessment & Plan Note (Signed)
Increased stress as outlined.  Husband recently passed.  Discussed with her today.  She has good support. Does not feel needs any further intervention at this time.  Follow.

## 2020-10-03 NOTE — Assessment & Plan Note (Signed)
On coreg.  No increased heart rate or palpitations.  Follow.  

## 2020-10-03 NOTE — Assessment & Plan Note (Signed)
Stable.  Follow.   

## 2020-10-03 NOTE — Assessment & Plan Note (Signed)
No evidence of volume overload.  On coreg.  Stable.  Follow.

## 2020-10-03 NOTE — Assessment & Plan Note (Signed)
On simvastatin.  Low cholesterol diet and exercise.  Follow lipid panel and liver function tests.   

## 2020-10-03 NOTE — Assessment & Plan Note (Signed)
Low carb diet and exercise.  Follow met b and a1c.  

## 2020-10-03 NOTE — Assessment & Plan Note (Signed)
Continue spironolactone, coreg and lasix.  No evidence of volume overload.  Follow.

## 2020-10-03 NOTE — Assessment & Plan Note (Signed)
Upper symptoms controlled on protonix.  

## 2020-10-12 ENCOUNTER — Other Ambulatory Visit: Payer: Self-pay | Admitting: Internal Medicine

## 2020-10-12 DIAGNOSIS — Z1231 Encounter for screening mammogram for malignant neoplasm of breast: Secondary | ICD-10-CM

## 2020-10-19 ENCOUNTER — Other Ambulatory Visit: Payer: Self-pay | Admitting: Internal Medicine

## 2020-10-19 DIAGNOSIS — C44722 Squamous cell carcinoma of skin of right lower limb, including hip: Secondary | ICD-10-CM | POA: Diagnosis not present

## 2020-10-22 ENCOUNTER — Ambulatory Visit (INDEPENDENT_AMBULATORY_CARE_PROVIDER_SITE_OTHER): Payer: Medicare Other

## 2020-10-22 VITALS — Ht 61.0 in | Wt 147.0 lb

## 2020-10-22 DIAGNOSIS — Z0000 Encounter for general adult medical examination without abnormal findings: Secondary | ICD-10-CM

## 2020-10-22 NOTE — Progress Notes (Signed)
Subjective:   Kristi Greer is a 70 y.o. female who presents for Medicare Annual (Subsequent) preventive examination.  Review of Systems    No ROS.  Medicare Wellness Virtual Visit.   Cardiac Risk Factors include: advanced age (>10men, >49 women);hypertension     Objective:    Today's Vitals   10/22/20 0907  Weight: 147 lb (66.7 kg)  Height: 5\' 1"  (1.549 m)   Body mass index is 27.78 kg/m.  Advanced Directives 10/22/2020 06/17/2019 06/09/2019 04/02/2019 12/31/2017 03/19/2017 10/03/2016  Does Patient Have a Medical Advance Directive? Yes Yes Yes Yes Yes No Yes  Type of Paramedic of [place];Living will [place];Living will Living will;Healthcare Power of [place];Living will Out of facility DNR (pink MOST or yellow form)  Does patient want to make changes to medical advance directive? No - Patient declined No - Patient declined - No - Patient declined No - Patient declined - -  Copy of [place] in Chart? No - copy requested No - copy requested - No - copy requested No - copy requested No - copy requested -  Would patient like information on creating a medical advance directive? - - - - - - -    Current Medications (verified) Outpatient Encounter Medications as of 10/22/2020  Medication Sig  . acetaminophen (TYLENOL) 500 MG tablet Take 500-1,000 mg by mouth every 6 (six) hours as needed for moderate pain or headache.  . ARTIFICIAL TEAR SOLUTION OP Place 1 drop into both eyes daily as needed (dry eyes).  . calcium carbonate (OSCAL) 1500 (600 Ca) MG TABS tablet Take 1,500 mg by mouth daily.   . calcium carbonate (TUMS - DOSED IN MG ELEMENTAL CALCIUM) 500 MG chewable tablet Chew 1 tablet by mouth daily as needed for indigestion or heartburn.  . carvedilol (COREG) 12.5 MG tablet Take 1 tablet (12.5 mg total) by mouth 2 (two)  times daily with a meal.  . Cholecalciferol 1000 units capsule Take 1,000 Units by mouth daily.  . diphenhydramine-acetaminophen (TYLENOL PM) 25-500 MG TABS tablet Take 1 tablet by mouth at bedtime as needed (sleep).   . fexofenadine (ALLEGRA) 180 MG tablet Take 180 mg by mouth at bedtime.   . furosemide (LASIX) 20 MG tablet TAKE 1 TABLET (20 MG TOTAL) BY MOUTH EVERY OTHER DAY.  . methimazole (TAPAZOLE) 10 MG tablet TAKE 1 TABLET BY MOUTH DAILY (Patient taking differently: 5 mg. )  . pantoprazole (PROTONIX) 40 MG tablet Take 40 mg by mouth daily.  . rosuvastatin (CRESTOR) 10 MG tablet TAKE 1 TABLET BY MOUTH EVERY DAY  . spironolactone (ALDACTONE) 25 MG tablet TAKE 1/2 TABLET BY MOUTH EVERY DAY  . SUMAtriptan (IMITREX) 100 MG tablet TAKE 1 TABLET BY MOUTH FOR 1 DOSE. MAY REPEAT IN 2 HOURS IF HEADACHE PERSISTS OR RECURS  . triamcinolone (NASACORT ALLERGY 24HR) 55 MCG/ACT AERO nasal inhaler Place 1 spray into the nose daily as needed (allergies).  . zonisamide (ZONEGRAN) 100 MG capsule TAKE 1 CAPSULE BY MOUTH EVERY DAY   No facility-administered encounter medications on file as of 10/22/2020.    Allergies (verified) Patient has no known allergies.   History: Past Medical History:  Diagnosis Date  . Allergy   . Atrial fibrillation ([place])   . Cancer ([place])    basal cell  . Cardiomyopathy, nonischemic ([place])   . Carpal tunnel syndrome    bilateral  .  Congestive heart failure ([place])    normalization of EF with medical therapy  . Dysrhythmia 08/31/2015   long RP tachycardia  . Environmental and seasonal allergies   . Female cystocele   . Hyperlipidemia   . Hypertension   . Hyperthyroidism   . Migraines   . Overactive detrusor   . Personal history of colonic polyps   . PONV (postoperative nausea and vomiting)   . SUI (stress urinary incontinence, female)   . Uterovaginal prolapse   . UTI (urinary tract infection)   . Vaginal atrophy    Past Surgical History:  Procedure Laterality  Date  . ANTERIOR AND POSTERIOR VAGINAL REPAIR W/ SACROSPINOUS LIGAMENT SUSPENSION  10/18/2012  . CARDIAC CATHETERIZATION N/A 11/26/2015   Procedure: Right/Left Heart Cath and Coronary Angiography;  Surgeon: Jolaine Artist, MD;  Location: [facility];  Service: Cardiovascular;  Laterality: N/A;  . CHOLECYSTECTOMY  2006  . COLONOSCOPY WITH PROPOFOL N/A 03/19/2017   Procedure: COLONOSCOPY WITH PROPOFOL;  Surgeon: Lollie Sails, MD;  Location: [facility] END[facility]COPY;  Service: Endoscopy;  Laterality: N/A;  . COLPOPEXY  10/18/2012  . CYSTOURETHROSCOPY  10/18/2012  . ESOPHAGOGASTRODUODENOSCOPY (EGD) WITH PROPOFOL N/A 10/03/2016   Procedure: ESOPHAGOGASTRODUODENOSCOPY (EGD) WITH PROPOFOL;  Surgeon: Lollie Sails, MD;  Location: [facility] ENDOSCOPY;  Service: Endoscopy;  Laterality: N/A;  . EYE SURGERY     cataract extration  . FINGER SURGERY    . HYSTEROSCOPY  2011, 10/2010  . OOPHORECTOMY    . REVERSE SHOULDER ARTHROPLASTY Right 06/17/2019   Procedure: REVERSE SHOULDER ARTHROPLASTY;  Surgeon: Corky Mull, MD;  Location: [facility] ORS;  Service: Orthopedics;  Laterality: Right;  . TONSILLECTOMY    . TUBAL LIGATION    . VAGINAL HYSTERECTOMY     midurethral sling   Family History  Problem Relation Age of Onset  . Hypertension Mother   . Stroke Mother   . Diabetes Mother   . Hypercholesterolemia Father   . Heart disease Father        MI  . Hypertension Father   . Stroke Father   . Breast cancer Neg Hx   . Colon cancer Neg Hx   . Thyroid disease Neg Hx    Social History   Socioeconomic History  . Marital status: Married    Spouse name: Not on file  . Number of children: 2  . Years of education: Not on file  . Highest education level: Not on file  Occupational History  . Not on file  Tobacco Use  . Smoking status: Never Smoker  . Smokeless tobacco: Never Used  Vaping Use  . Vaping Use: Never used  Substance and Sexual Activity  . Alcohol use: Yes    Alcohol/week: 0.0 standard  drinks    Comment: Socially  . Drug use: No  . Sexual activity: Not on file  Other Topics Concern  . Not on file  Social History Narrative  . Not on file   Social Determinants of Health   Financial Resource Strain: Low Risk   . Difficulty of Paying Living Expenses: Not hard at all  Food Insecurity: No Food Insecurity  . Worried About Charity fundraiser in the Last Year: Never true  . Ran Out of Food in the Last Year: Never true  Transportation Needs: No Transportation Needs  . Lack of Transportation (Medical): No  . Lack of Transportation (Non-Medical): No  Physical Activity: Not on file  Stress: No Stress Concern Present  . Feeling of Stress :  Not at all  Social Connections: Unknown  . Frequency of Communication with Friends and Family: More than three times a week  . Frequency of Social Gatherings with Friends and Family: More than three times a week  . Attends Religious Services: Not on file  . Active Member of Clubs or Organizations: Not on file  . Attends Archivist Meetings: Not on file  . Marital Status: Not on file    Tobacco Counseling Counseling given: Not Answered   Clinical Intake:  Pre-visit preparation completed: Yes        Diabetes: No  How often do you need to have someone help you when you read instructions, pamphlets, or other written materials from your doctor or pharmacy?: 1 - Never  Interpreter Needed?: No      Activities of Daily Living In your present state of health, do you have any difficulty performing the following activities: 10/22/2020  Hearing? N  Vision? N  Difficulty concentrating or making decisions? N  Walking or climbing stairs? N  Dressing or bathing? N  Doing errands, shopping? N  Preparing Food and eating ? N  Using the Toilet? N  In the past six months, have you accidently leaked urine? N  Do you have problems with loss of bowel control? N  Managing your Medications? N  Managing your Finances? N   Housekeeping or managing your Housekeeping? N  Some recent data might be hidden    Patient Care Team: Einar Pheasant, MD as PCP - General (Internal Medicine) Minna Merritts, MD as PCP - Cardiology (Cardiology) Alisa Graff, FNP as Nurse Practitioner (Family Medicine) Isaias Cowman, MD as Consulting Physician (Cardiology) Abisogun, Domenica Reamer, MD as Consulting Physician (Endocrinology)  Indicate any recent Medical Services you may have received from other than [facility] providers in the past year (date may be approximate).     Assessment:   This is a routine wellness examination for Gurtha.  I connected with Kaliegh today by telephone and verified that I am speaking with the correct person using two identifiers. Location patient: home Location provider: work Persons participating in the virtual visit: patient, Marine scientist.    I discussed the limitations, risks, security and privacy concerns of performing an evaluation and management service by telephone and the availability of in person appointments. The patient expressed understanding and verbally consented to this telephonic visit.    Interactive audio and video telecommunications were attempted between this provider and patient, however failed, due to patient having technical difficulties OR patient did not have access to video capability.  We continued and completed visit with audio only.  Some vital signs may be absent or patient reported.   Hearing/Vision screen  Hearing Screening   125Hz  250Hz  500Hz  1000Hz  2000Hz  3000Hz  4000Hz  6000Hz  8000Hz   Right ear:           Left ear:           Comments: Patient is able to hear conversational tones without difficulty.  No issues reported.  Vision Screening Comments: Cataract extraction, bilateral  Visual acuity not assessed, virtual visit.  Dietary issues and exercise activities discussed: Current Exercise Habits: Home exercise routine  Healthy diet Good water intake  Goals       Patient Stated   .  DIET - REDUCE PORTION SIZE (pt-stated)    .  Increase physical activity (pt-stated)      Start exercising at the gym      Depression Screen [facility] 2/9 Scores 10/22/2020 05/11/2020 11/13/2019  11/06/2019 04/02/2019 12/12/2017 07/28/2016  PHQ - 2 Score 0 0 0 6 0 0 0    Fall Risk Fall Risk  10/22/2020 05/11/2020 11/13/2019 11/06/2019 04/02/2019  Falls in the past year? 0 0 0 0 1  Number falls in past yr: 0 0 0 0 0  Injury with Fall? 0 0 0 0 0  Follow up Falls evaluation completed Falls evaluation completed - - Falls prevention discussed    FALL RISK PREVENTION PERTAINING TO THE HOME: Handrails in use when climbing stairs? Yes Home free of loose throw rugs in walkways, pet beds, electrical cords, etc? Yes  Adequate lighting in your home to reduce risk of falls? Yes   ASSISTIVE DEVICES UTILIZED TO PREVENT FALLS: Use of a cane, walker or w/c? No    TIMED UP AND GO: Was the test performed? No . Virtual visit.   Cognitive Function:  Patient is alert and oriented x3.  Denies difficulty focusing, making decisions, memory loss.  Enjoys reading and puzzles on the ipad for brain health.  MMSE/6CIT deferred. Normal by direct communication/observation.    6CIT Screen 04/02/2019  What Year? 0 points  What month? 0 points  What time? 0 points  Count back from 20 0 points  Months in reverse 0 points  Repeat phrase 0 points  Total Score 0    Immunizations Immunization History  Administered Date(s) Administered  . Fluad Quad(high Dose 65+) 04/28/2019, 09/29/2020  . Influenza Split 05/27/2012, 05/26/2013, 05/15/2014  . Influenza, High Dose Seasonal PF 05/01/2018  . Influenza-Unspecified 05/26/2015  . Pneumococcal Conjugate-13 05/13/2019    TDAP status: Due, Education has been provided regarding the importance of this vaccine. Advised may receive this vaccine at local pharmacy or Health Dept. Aware to provide a copy of the vaccination record if obtained from local pharmacy or  Health Dept. Verbalized acceptance and understanding. Deferred.   Health Maintenance Health Maintenance  Topic Date Due  . TETANUS/TDAP  10/22/2021 (Originally 12/17/1969)  . PNA vac Low Risk Adult (2 of 2 - PPSV23) 10/22/2021 (Originally 05/12/2020)  . COVID-19 Vaccine (1) 11/07/2021 (Originally 12/16/1955)  . MAMMOGRAM  11/12/2020  . COLONOSCOPY (Pts 45-2yrs Insurance coverage will need to be confirmed)  03/20/2027  . INFLUENZA VACCINE  Completed  . DEXA SCAN  Completed  . Hepatitis C Screening  Completed   Colorectal cancer screening: Type of screening: Colonoscopy. Completed 03/19/17. Repeat every 10 years  Lung Cancer Screening: (Low Dose CT Chest recommended if Age 79-80 years, 30 pack-year currently smoking OR have quit w/in 15years.) does not qualify.   PNA 23- vaccine  Vision Screening: Recommended annual ophthalmology exams for early detection of glaucoma and other disorders of the eye. Is the patient up to date with their annual eye exam?  Yes   Dental Screening: Recommended annual dental exams for proper oral hygiene. Visits every 12 months.   Community Resource Referral / Chronic Care Management: CRR required this visit?  No   CCM required this visit?  No      Plan:   Keep all routine maintenance appointments.   Follow up 01/28/21 @ 10:00. Update immunization record at this time.   I have personally reviewed and noted the following in the patient's chart:   . Medical and social history . Use of alcohol, tobacco or illicit drugs  . Current medications and supplements- no opioids in use. . Functional ability and status . Nutritional status . Physical activity . Advanced directives . List of other physicians . Hospitalizations, surgeries,  and ER visits in previous 12 months . Vitals . Screenings to include cognitive, depression, and falls . Referrals and appointments  In addition, I have reviewed and discussed with patient certain preventive protocols, quality  metrics, and best practice recommendations. A written personalized care plan for preventive services as well as general preventive health recommendations were provided to patient via mychart.     Varney Biles, LPN   [id number]/5537

## 2020-10-22 NOTE — Patient Instructions (Addendum)
Kristi Greer , Thank you for taking time to come for your Medicare Wellness Visit. I appreciate your ongoing commitment to your health goals. Please review the following plan we discussed and let me know if I can assist you in the future.   These are the goals we discussed: Goals      Patient Stated   .  DIET - REDUCE PORTION SIZE (pt-stated)    .  Increase physical activity (pt-stated)      Start exercising at the gym       This is a list of the screening recommended for you and due dates:  Health Maintenance  Topic Date Due  . Tetanus Vaccine  10/22/2021*  . Pneumonia vaccines (2 of 2 - PPSV23) 10/22/2021*  . COVID-19 Vaccine (1) 11/07/2021*  . Mammogram  11/12/2020  . Colon Cancer Screening  03/20/2027  . Flu Shot  Completed  . DEXA scan (bone density measurement)  Completed  .  Hepatitis C: One time screening is recommended by Center for Disease Control  (CDC) for  adults born from [birth year] through 1963.   Completed  *Topic was postponed. The date shown is not the original due date.    Immunizations Immunization History  Administered Date(s) Administered  . Fluad Quad(high Dose 68+) 04/28/2019, 09/29/2020  . Influenza Split 05/27/2012, 05/26/2013, 05/15/2014  . Influenza, High Dose Seasonal PF 05/01/2018  . Influenza-Unspecified 05/26/2015  . Pneumococcal Conjugate-13 05/13/2019   Keep all routine maintenance appointments.   Follow up 01/28/21 @ 10:00. Update immunization record at this time.   Advanced directives: End of life planning; Advance aging; Advanced directives discussed.  Copy of current HCPOA/Living Will requested.    Conditions/risks identified: none new.   Follow up in one year for your annual wellness visit.   Preventive Care 41 Years and Older, Female Preventive care refers to lifestyle choices and visits with your health care provider that can promote health and wellness. What does preventive care include?  A yearly physical exam. This is also  called an annual well check.  Dental exams once or twice a year.  Routine eye exams. Ask your health care provider how often you should have your eyes checked.  Personal lifestyle choices, including:  Daily care of your teeth and gums.  Regular physical activity.  Eating a healthy diet.  Avoiding tobacco and drug use.  Limiting alcohol use.  Practicing safe sex.  Taking low-dose aspirin every day.  Taking vitamin and mineral supplements as recommended by your health care provider. What happens during an annual well check? The services and screenings done by your health care provider during your annual well check will depend on your age, overall health, lifestyle risk factors, and family history of disease. Counseling  Your health care provider may ask you questions about your:  Alcohol use.  Tobacco use.  Drug use.  Emotional well-being.  Home and relationship well-being.  Sexual activity.  Eating habits.  History of falls.  Memory and ability to understand (cognition).  Work and work Statistician.  Reproductive health. Screening  You may have the following tests or measurements:  Height, weight, and BMI.  Blood pressure.  Lipid and cholesterol levels. These may be checked every 5 years, or more frequently if you are over 44 years old.  Skin check.  Lung cancer screening. You may have this screening every year starting at age 68 if you have a 30-pack-year history of smoking and currently smoke or have quit within the past 15 years.  Fecal occult blood test (FOBT) of the stool. You may have this test every year starting at age 68.  Flexible sigmoidoscopy or colonoscopy. You may have a sigmoidoscopy every 5 years or a colonoscopy every 10 years starting at age 68.  Hepatitis C blood test.  Hepatitis B blood test.  Sexually transmitted disease (STD) testing.  Diabetes screening. This is done by checking your blood sugar (glucose) after you have not  eaten for a while (fasting). You may have this done every 1-3 years.  Bone density scan. This is done to screen for osteoporosis. You may have this done starting at age 68.  Mammogram. This may be done every 1-2 years. Talk to your health care provider about how often you should have regular mammograms. Talk with your health care provider about your test results, treatment options, and if necessary, the need for more tests. Vaccines  Your health care provider may recommend certain vaccines, such as:  Influenza vaccine. This is recommended every year.  Tetanus, diphtheria, and acellular pertussis (Tdap, Td) vaccine. You may need a Td booster every 10 years.  Zoster vaccine. You may need this after age 68.  Pneumococcal 13-valent conjugate (PCV13) vaccine. One dose is recommended after age 68.  Pneumococcal polysaccharide (PPSV23) vaccine. One dose is recommended after age 68. Talk to your health care provider about which screenings and vaccines you need and how often you need them. This information is not intended to replace advice given to you by your health care provider. Make sure you discuss any questions you have with your health care provider. Document Released: 09/10/2015 Document Revised: 05/03/2016 Document Reviewed: 06/15/2015 Elsevier Interactive Patient Education  2017 [employer] Prevention in the Home Falls can cause injuries. They can happen to people of all ages. There are many things you can do to make your home safe and to help prevent falls. What can I do on the outside of my home?  Regularly fix the edges of walkways and driveways and fix any cracks.  Remove anything that might make you trip as you walk through a door, such as a raised step or threshold.  Trim any bushes or trees on the path to your home.  Use bright outdoor lighting.  Clear any walking paths of anything that might make someone trip, such as rocks or tools.  Regularly check to see if  handrails are loose or broken. Make sure that both sides of any steps have handrails.  Any raised decks and porches should have guardrails on the edges.  Have any leaves, snow, or ice cleared regularly.  Use sand or salt on walking paths during winter.  Clean up any spills in your garage right away. This includes oil or grease spills. What can I do in the bathroom?  Use night lights.  Install grab bars by the toilet and in the tub and shower. Do not use towel bars as grab bars.  Use non-skid mats or decals in the tub or shower.  If you need to sit down in the shower, use a plastic, non-slip stool.  Keep the floor dry. Clean up any water that spills on the floor as soon as it happens.  Remove soap buildup in the tub or shower regularly.  Attach bath mats securely with double-sided non-slip rug tape.  Do not have throw rugs and other things on the floor that can make you trip. What can I do in the bedroom?  Use night lights.  Make sure that  you have a light by your bed that is easy to reach.  Do not use any sheets or blankets that are too big for your bed. They should not hang down onto the floor.  Have a firm chair that has side arms. You can use this for support while you get dressed.  Do not have throw rugs and other things on the floor that can make you trip. What can I do in the kitchen?  Clean up any spills right away.  Avoid walking on wet floors.  Keep items that you use a lot in easy-to-reach places.  If you need to reach something above you, use a strong step stool that has a grab bar.  Keep electrical cords out of the way.  Do not use floor polish or wax that makes floors slippery. If you must use wax, use non-skid floor wax.  Do not have throw rugs and other things on the floor that can make you trip. What can I do with my stairs?  Do not leave any items on the stairs.  Make sure that there are handrails on both sides of the stairs and use them. Fix  handrails that are broken or loose. Make sure that handrails are as long as the stairways.  Check any carpeting to make sure that it is firmly attached to the stairs. Fix any carpet that is loose or worn.  Avoid having throw rugs at the top or bottom of the stairs. If you do have throw rugs, attach them to the floor with carpet tape.  Make sure that you have a light switch at the top of the stairs and the bottom of the stairs. If you do not have them, ask someone to add them for you. What else can I do to help prevent falls?  Wear shoes that:  Do not have high heels.  Have rubber bottoms.  Are comfortable and fit you well.  Are closed at the toe. Do not wear sandals.  If you use a stepladder:  Make sure that it is fully opened. Do not climb a closed stepladder.  Make sure that both sides of the stepladder are locked into place.  Ask someone to hold it for you, if possible.  Clearly mark and make sure that you can see:  Any grab bars or handrails.  First and last steps.  Where the edge of each step is.  Use tools that help you move around (mobility aids) if they are needed. These include:  Canes.  Walkers.  Scooters.  Crutches.  Turn on the lights when you go into a dark area. Replace any light bulbs as soon as they burn out.  Set up your furniture so you have a clear path. Avoid moving your furniture around.  If any of your floors are uneven, fix them.  If there are any pets around you, be aware of where they are.  Review your medicines with your doctor. Some medicines can make you feel dizzy. This can increase your chance of falling. Ask your doctor what other things that you can do to help prevent falls. This information is not intended to replace advice given to you by your health care provider. Make sure you discuss any questions you have with your health care provider. Document Released: 06/10/2009 Document Revised: 01/20/2016 Document Reviewed:  09/18/2014 Elsevier Interactive Patient Education  2017 [employer].

## 2020-10-25 DIAGNOSIS — L0390 Cellulitis, unspecified: Secondary | ICD-10-CM | POA: Diagnosis not present

## 2020-10-25 DIAGNOSIS — M7062 Trochanteric bursitis, left hip: Secondary | ICD-10-CM | POA: Diagnosis not present

## 2020-11-08 DIAGNOSIS — M6281 Muscle weakness (generalized): Secondary | ICD-10-CM | POA: Diagnosis not present

## 2020-11-08 DIAGNOSIS — M7061 Trochanteric bursitis, right hip: Secondary | ICD-10-CM | POA: Diagnosis not present

## 2020-11-08 DIAGNOSIS — M25551 Pain in right hip: Secondary | ICD-10-CM | POA: Diagnosis not present

## 2020-11-15 ENCOUNTER — Ambulatory Visit
Admission: RE | Admit: 2020-11-15 | Discharge: 2020-11-15 | Disposition: A | Discharge status: Home / Self Care | Payer: Medicare Other | Source: Ambulatory Visit | Attending: Internal Medicine | Admitting: Internal Medicine

## 2020-11-15 ENCOUNTER — Other Ambulatory Visit: Payer: Self-pay

## 2020-11-15 DIAGNOSIS — M6281 Muscle weakness (generalized): Secondary | ICD-10-CM | POA: Diagnosis not present

## 2020-11-15 DIAGNOSIS — Z1231 Encounter for screening mammogram for malignant neoplasm of breast: Secondary | ICD-10-CM | POA: Insufficient documentation

## 2020-11-15 DIAGNOSIS — M7061 Trochanteric bursitis, right hip: Secondary | ICD-10-CM | POA: Diagnosis not present

## 2020-11-15 DIAGNOSIS — Z96611 Presence of right artificial shoulder joint: Secondary | ICD-10-CM | POA: Diagnosis not present

## 2020-11-15 DIAGNOSIS — M25551 Pain in right hip: Secondary | ICD-10-CM | POA: Diagnosis not present

## 2020-11-22 DIAGNOSIS — D509 Iron deficiency anemia, unspecified: Secondary | ICD-10-CM | POA: Diagnosis not present

## 2020-11-22 DIAGNOSIS — K219 Gastro-esophageal reflux disease without esophagitis: Secondary | ICD-10-CM | POA: Diagnosis not present

## 2020-11-24 DIAGNOSIS — M25551 Pain in right hip: Secondary | ICD-10-CM | POA: Diagnosis not present

## 2020-11-24 DIAGNOSIS — M6281 Muscle weakness (generalized): Secondary | ICD-10-CM | POA: Diagnosis not present

## 2020-11-24 DIAGNOSIS — M7061 Trochanteric bursitis, right hip: Secondary | ICD-10-CM | POA: Diagnosis not present

## 2020-11-29 DIAGNOSIS — M7061 Trochanteric bursitis, right hip: Secondary | ICD-10-CM | POA: Diagnosis not present

## 2020-12-04 ENCOUNTER — Other Ambulatory Visit: Payer: Self-pay | Admitting: Cardiovascular Disease

## 2020-12-09 DIAGNOSIS — M7061 Trochanteric bursitis, right hip: Secondary | ICD-10-CM | POA: Diagnosis not present

## 2020-12-13 ENCOUNTER — Other Ambulatory Visit: Payer: Self-pay | Admitting: Student

## 2020-12-13 DIAGNOSIS — M7062 Trochanteric bursitis, left hip: Secondary | ICD-10-CM

## 2020-12-13 DIAGNOSIS — S76012A Strain of muscle, fascia and tendon of left hip, initial encounter: Secondary | ICD-10-CM

## 2020-12-22 ENCOUNTER — Ambulatory Visit
Admission: RE | Admit: 2020-12-22 | Discharge: 2020-12-22 | Disposition: A | Discharge status: Home / Self Care | Payer: Medicare Other | Source: Ambulatory Visit | Attending: Student | Admitting: Student

## 2020-12-22 ENCOUNTER — Other Ambulatory Visit: Payer: Self-pay

## 2020-12-22 DIAGNOSIS — M7062 Trochanteric bursitis, left hip: Secondary | ICD-10-CM | POA: Insufficient documentation

## 2020-12-22 DIAGNOSIS — S76012A Strain of muscle, fascia and tendon of left hip, initial encounter: Secondary | ICD-10-CM | POA: Insufficient documentation

## 2020-12-22 DIAGNOSIS — M1612 Unilateral primary osteoarthritis, left hip: Secondary | ICD-10-CM | POA: Diagnosis not present

## 2020-12-27 DIAGNOSIS — M1612 Unilateral primary osteoarthritis, left hip: Secondary | ICD-10-CM | POA: Diagnosis not present

## 2020-12-27 DIAGNOSIS — M25552 Pain in left hip: Secondary | ICD-10-CM | POA: Diagnosis not present

## 2020-12-27 DIAGNOSIS — M7062 Trochanteric bursitis, left hip: Secondary | ICD-10-CM | POA: Diagnosis not present

## 2020-12-27 DIAGNOSIS — G8929 Other chronic pain: Secondary | ICD-10-CM | POA: Diagnosis not present

## 2020-12-27 DIAGNOSIS — M24152 Other articular cartilage disorders, left hip: Secondary | ICD-10-CM | POA: Diagnosis not present

## 2020-12-27 DIAGNOSIS — S73192A Other sprain of left hip, initial encounter: Secondary | ICD-10-CM | POA: Diagnosis not present

## 2021-01-13 DIAGNOSIS — H43812 Vitreous degeneration, left eye: Secondary | ICD-10-CM | POA: Diagnosis not present

## 2021-01-16 ENCOUNTER — Other Ambulatory Visit: Payer: Self-pay | Admitting: Internal Medicine

## 2021-01-28 ENCOUNTER — Ambulatory Visit (INDEPENDENT_AMBULATORY_CARE_PROVIDER_SITE_OTHER): Payer: Medicare Other | Admitting: Internal Medicine

## 2021-01-28 ENCOUNTER — Other Ambulatory Visit: Payer: Self-pay

## 2021-01-28 VITALS — BP 106/64 | HR 86 | Temp 98.2°F | Resp 16 | Ht 61.0 in | Wt 148.8 lb

## 2021-01-28 DIAGNOSIS — I5022 Chronic systolic (congestive) heart failure: Secondary | ICD-10-CM

## 2021-01-28 DIAGNOSIS — I420 Dilated cardiomyopathy: Secondary | ICD-10-CM

## 2021-01-28 DIAGNOSIS — I471 Supraventricular tachycardia: Secondary | ICD-10-CM

## 2021-01-28 DIAGNOSIS — K219 Gastro-esophageal reflux disease without esophagitis: Secondary | ICD-10-CM | POA: Diagnosis not present

## 2021-01-28 DIAGNOSIS — F439 Reaction to severe stress, unspecified: Secondary | ICD-10-CM | POA: Diagnosis not present

## 2021-01-28 DIAGNOSIS — I472 Ventricular tachycardia: Secondary | ICD-10-CM | POA: Diagnosis not present

## 2021-01-28 DIAGNOSIS — Z23 Encounter for immunization: Secondary | ICD-10-CM

## 2021-01-28 DIAGNOSIS — R739 Hyperglycemia, unspecified: Secondary | ICD-10-CM

## 2021-01-28 DIAGNOSIS — Z0000 Encounter for general adult medical examination without abnormal findings: Secondary | ICD-10-CM

## 2021-01-28 DIAGNOSIS — E7800 Pure hypercholesterolemia, unspecified: Secondary | ICD-10-CM | POA: Diagnosis not present

## 2021-01-28 DIAGNOSIS — E0590 Thyrotoxicosis, unspecified without thyrotoxic crisis or storm: Secondary | ICD-10-CM | POA: Diagnosis not present

## 2021-01-28 DIAGNOSIS — I4729 Other ventricular tachycardia: Secondary | ICD-10-CM

## 2021-01-28 DIAGNOSIS — R198 Other specified symptoms and signs involving the digestive system and abdomen: Secondary | ICD-10-CM

## 2021-01-28 LAB — CBC WITH DIFFERENTIAL/PLATELET
Basophils Absolute: 0 10*3/uL (ref 0.0–0.1)
Basophils Relative: 0.4 % (ref 0.0–3.0)
Eosinophils Absolute: 0.2 10*3/uL (ref 0.0–0.7)
Eosinophils Relative: 2.6 % (ref 0.0–5.0)
HCT: 38.7 % (ref 36.0–46.0)
Hemoglobin: 12.6 g/dL (ref 12.0–15.0)
Lymphocytes Relative: 31.3 % (ref 12.0–46.0)
Lymphs Abs: 2.3 10*3/uL (ref 0.7–4.0)
MCHC: 32.4 g/dL (ref 30.0–36.0)
MCV: 84.1 fl (ref 78.0–100.0)
Monocytes Absolute: 0.5 10*3/uL (ref 0.1–1.0)
Monocytes Relative: 6.4 % (ref 3.0–12.0)
Neutro Abs: 4.3 10*3/uL (ref 1.4–7.7)
Neutrophils Relative %: 59.3 % (ref 43.0–77.0)
Platelets: 224 10*3/uL (ref 150.0–400.0)
RBC: 4.6 Mil/uL (ref 3.87–5.11)
RDW: 15.4 % (ref 11.5–15.5)
WBC: 7.3 10*3/uL (ref 4.0–10.5)

## 2021-01-28 LAB — T4, FREE: Free T4: 0.6 ng/dL (ref 0.60–1.60)

## 2021-01-28 LAB — HEMOGLOBIN A1C: Hgb A1c MFr Bld: 6.2 % (ref 4.6–6.5)

## 2021-01-28 LAB — TSH: TSH: 1.51 u[IU]/mL (ref 0.35–4.50)

## 2021-01-28 NOTE — Progress Notes (Signed)
[name], female   DOB: 19-Apr-1951, 70 y.o.   MRN: [id number]   Subjective:    [name], female    DOB: [birth year], 70 y.o.   MRN: [id number]  HPI This visit occurred during the SARS-CoV-2 public health emergency.  Safety protocols were in place, including screening questions prior to the visit, additional usage of staff PPE, and extensive cleaning of exam room while observing appropriate contact time as indicated for disinfecting solutions.  Patient with past history of hypercholesterolemia, CHF and hypertension. She comes in today to follow up on these issues as well as for a complete physical exam.  She reports she is doing relatively well.  Breathing stable.  No chest pain or increased sob.  No cough or congestion.  No abdominal pain.  Does report some persistent bowel issues.  Noticed would have a bowel movement when urinated.  Some mucus present.  Saw Thayer Headings.  Adjusted diet.  Bowels are better. Planning for colonoscopy.  Has had left hip pain s/p injection 12/27/20 - recommended PT.    Past Medical History:  Diagnosis Date  . Allergy   . Atrial fibrillation ([place])   . Cancer ([place])    basal cell  . Cardiomyopathy, nonischemic ([place])   . Carpal tunnel syndrome    bilateral  . Congestive heart failure ([place])    normalization of EF with medical therapy  . Dysrhythmia 08/31/2015   long RP tachycardia  . Environmental and seasonal allergies   . Female cystocele   . Hyperlipidemia   . Hypertension   . Hyperthyroidism   . Migraines   . Overactive detrusor   . Personal history of colonic polyps   . PONV (postoperative nausea and vomiting)   . SUI (stress urinary incontinence, female)   . Uterovaginal prolapse   . UTI (urinary tract infection)   . Vaginal atrophy    Past Surgical History:  Procedure Laterality Date  . ANTERIOR AND POSTERIOR VAGINAL REPAIR W/ SACROSPINOUS LIGAMENT SUSPENSION  10/18/2012  . CARDIAC CATHETERIZATION N/A 11/26/2015    Procedure: Right/Left Heart Cath and Coronary Angiography;  Surgeon: Jolaine Artist, MD;  Location: [facility];  Service: Cardiovascular;  Laterality: N/A;  . CHOLECYSTECTOMY  2006  . COLONOSCOPY WITH PROPOFOL N/A 03/19/2017   Procedure: COLONOSCOPY WITH PROPOFOL;  Surgeon: Lollie Sails, MD;  Location: [facility] ENDOSCOPY;  Service: Endoscopy;  Laterality: N/A;  . COLPOPEXY  10/18/2012  . CYSTOURETHROSCOPY  10/18/2012  . ESOPHAGOGASTRODUODENOSCOPY (EGD) WITH PROPOFOL N/A 10/03/2016   Procedure: ESOPHAGOGASTRODUODENOSCOPY (EGD) WITH PROPOFOL;  Surgeon: Lollie Sails, MD;  Location: [facility] ENDOSCOPY;  Service: Endoscopy;  Laterality: N/A;  . EYE SURGERY     cataract extration  . FINGER SURGERY    . HYSTEROSCOPY  2011, 10/2010  . OOPHORECTOMY    . REVERSE SHOULDER ARTHROPLASTY Right 06/17/2019   Procedure: REVERSE SHOULDER ARTHROPLASTY;  Surgeon: Corky Mull, MD;  Location: [facility] ORS;  Service: Orthopedics;  Laterality: Right;  . TONSILLECTOMY    . TUBAL LIGATION    . VAGINAL HYSTERECTOMY     midurethral sling   Family History  Problem Relation Age of Onset  . Hypertension Mother   . Stroke Mother   . Diabetes Mother   . Hypercholesterolemia Father   . Heart disease Father        MI  . Hypertension Father   . Stroke Father   . Breast cancer Neg Hx   . Colon cancer Neg Hx   .  Thyroid disease Neg Hx    Social History   Socioeconomic History  . Marital status: Widowed    Spouse name: Not on file  . Number of children: 2  . Years of education: Not on file  . Highest education level: Not on file  Occupational History  . Not on file  Tobacco Use  . Smoking status: Never Smoker  . Smokeless tobacco: Never Used  Vaping Use  . Vaping Use: Never used  Substance and Sexual Activity  . Alcohol use: Yes    Alcohol/week: 0.0 standard drinks    Comment: Socially  . Drug use: No  . Sexual activity: Not on file  Other Topics Concern  . Not on file  Social History  Narrative  . Not on file   Social Determinants of Health   Financial Resource Strain: Low Risk   . Difficulty of Paying Living Expenses: Not hard at all  Food Insecurity: No Food Insecurity  . Worried About Charity fundraiser in the Last Year: Never true  . Ran Out of Food in the Last Year: Never true  Transportation Needs: No Transportation Needs  . Lack of Transportation (Medical): No  . Lack of Transportation (Non-Medical): No  Physical Activity: Not on file  Stress: No Stress Concern Present  . Feeling of Stress : Not at all  Social Connections: Unknown  . Frequency of Communication with Friends and Family: More than three times a week  . Frequency of Social Gatherings with Friends and Family: More than three times a week  . Attends Religious Services: Not on file  . Active Member of Clubs or Organizations: Not on file  . Attends Archivist Meetings: Not on file  . Marital Status: Not on file    Outpatient Encounter Medications as of 01/28/2021  Medication Sig  . acetaminophen (TYLENOL) 500 MG tablet Take 500-1,000 mg by mouth every 6 (six) hours as needed for moderate pain or headache.  . ARTIFICIAL TEAR SOLUTION OP Place 1 drop into both eyes daily as needed (dry eyes).  . calcium carbonate (OSCAL) 1500 (600 Ca) MG TABS tablet Take 1,500 mg by mouth daily.   . calcium carbonate (TUMS - DOSED IN MG ELEMENTAL CALCIUM) 500 MG chewable tablet Chew 1 tablet by mouth daily as needed for indigestion or heartburn.  . carvedilol (COREG) 12.5 MG tablet TAKE 1 TABLET (12.5 MG TOTAL) BY MOUTH 2 (TWO) TIMES DAILY WITH A MEAL.  Marland Kitchen Cholecalciferol 1000 units capsule Take 1,000 Units by mouth daily.  . diphenhydramine-acetaminophen (TYLENOL PM) 25-500 MG TABS tablet Take 1 tablet by mouth at bedtime as needed (sleep).   . fexofenadine (ALLEGRA) 180 MG tablet Take 180 mg by mouth at bedtime.   . furosemide (LASIX) 20 MG tablet TAKE 1 TABLET (20 MG TOTAL) BY MOUTH EVERY OTHER DAY.  .  methimazole (TAPAZOLE) 10 MG tablet TAKE 1 TABLET BY MOUTH DAILY (Patient taking differently: 5 mg. )  . pantoprazole (PROTONIX) 40 MG tablet Take 40 mg by mouth daily.  . rosuvastatin (CRESTOR) 10 MG tablet TAKE 1 TABLET BY MOUTH EVERY DAY  . SUMAtriptan (IMITREX) 100 MG tablet TAKE 1 TABLET BY MOUTH FOR 1 DOSE. MAY REPEAT IN 2 HOURS IF HEADACHE PERSISTS OR RECURS  . triamcinolone (NASACORT ALLERGY 24HR) 55 MCG/ACT AERO nasal inhaler Place 1 spray into the nose daily as needed (allergies).  . zonisamide (ZONEGRAN) 100 MG capsule TAKE 1 CAPSULE BY MOUTH EVERY DAY  . [DISCONTINUED] spironolactone (ALDACTONE) 25 MG  tablet TAKE 1/2 TABLET BY MOUTH EVERY DAY (Patient not taking: Reported on 01/28/2021)   No facility-administered encounter medications on file as of 01/28/2021.    Review of Systems  Constitutional: Negative for appetite change and unexpected weight change.  HENT: Negative for congestion, sinus pressure and sore throat.   Eyes: Negative for pain and visual disturbance.  Respiratory: Negative for cough, chest tightness and shortness of breath.   Cardiovascular: Negative for chest pain, palpitations and leg swelling.  Gastrointestinal: Negative for abdominal pain and nausea.       Bowels doing better as outlined.   Genitourinary: Negative for difficulty urinating and dysuria.  Musculoskeletal: Negative for joint swelling and myalgias.  Skin: Negative for color change and rash.  Neurological: Negative for dizziness, light-headedness and headaches.  Hematological: Negative for adenopathy. Does not bruise/bleed easily.  Psychiatric/Behavioral: Negative for agitation and dysphoric mood.       Objective:    Physical Exam Vitals reviewed.  Constitutional:      General: She is not in acute distress.    Appearance: Normal appearance. She is well-developed.  HENT:     Head: Normocephalic and atraumatic.     Right Ear: External ear normal.     Left Ear: External ear normal.  Eyes:      General: No scleral icterus.       Right eye: No discharge.        Left eye: No discharge.     Conjunctiva/sclera: Conjunctivae normal.  Neck:     Thyroid: No thyromegaly.  Cardiovascular:     Rate and Rhythm: Normal rate and regular rhythm.  Pulmonary:     Effort: No tachypnea, accessory muscle usage or respiratory distress.     Breath sounds: Normal breath sounds. No decreased breath sounds or wheezing.  Chest:  Breasts:     Right: No inverted nipple, mass, nipple discharge or tenderness (no axillary adenopathy).     Left: No inverted nipple, mass, nipple discharge or tenderness (no axilarry adenopathy).    Abdominal:     General: Bowel sounds are normal.     Palpations: Abdomen is soft.     Tenderness: There is no abdominal tenderness.  Musculoskeletal:        General: No swelling or tenderness.     Cervical back: Neck supple. No tenderness.  Lymphadenopathy:     Cervical: No cervical adenopathy.  Skin:    Findings: No erythema or rash.  Neurological:     Mental Status: She is alert and oriented to person, place, and time.  Psychiatric:        Mood and Affect: Mood normal.        Behavior: Behavior normal.     BP 106/64   Pulse 86   Temp 98.2 F (36.8 C)   Resp 16   Ht $R'5\' 1"'Xa$  (1.549 m)   Wt 148 lb 12.8 oz (67.5 kg)   LMP 10/18/2012   SpO2 98%   BMI 28.12 kg/m  Wt Readings from Last 3 Encounters:  01/28/21 148 lb 12.8 oz (67.5 kg)  10/22/20 147 lb (66.7 kg)  09/29/20 147 lb 3.2 oz (66.8 kg)     Lab Results  Component Value Date   WBC 7.4 05/11/2020   HGB 12.8 05/11/2020   HCT 40.0 05/11/2020   PLT 220.0 05/11/2020   GLUCOSE 89 09/29/2020   CHOL 142 09/29/2020   TRIG 91.0 09/29/2020   HDL 52.40 09/29/2020   LDLCALC 71 09/29/2020   ALT 11 09/29/2020  AST 15 09/29/2020   NA 143 09/29/2020   K 4.1 09/29/2020   CL 107 09/29/2020   CREATININE 0.88 09/29/2020   BUN 16 09/29/2020   CO2 31 09/29/2020   TSH 2.30 05/11/2020   INR 0.98 11/26/2015    HGBA1C 6.1 09/29/2020    MR HIP LEFT WO CONTRAST  Result Date: 12/22/2020 CLINICAL DATA:  No known injury, left medial hip pain EXAM: MR OF THE LEFT HIP WITHOUT CONTRAST TECHNIQUE: Multiplanar, multisequence MR imaging was performed. No intravenous contrast was administered. COMPARISON:  None. FINDINGS: Bones: No hip fracture, dislocation or avascular necrosis. No periosteal reaction or bone destruction. No aggressive osseous lesion. Normal sacrum and sacroiliac joints. No SI joint widening or erosive changes. Articular cartilage and labrum Articular cartilage:  No chondral defect. Labrum: Left labral degeneration with a probable small superior anterior labral tear. Joint or bursal effusion Joint effusion:  No hip joint effusion.  No SI joint effusion. Bursae:  No bursa formation. Muscles and tendons Flexors: Normal. Extensors: Normal. Abductors: Normal. Adductors: Normal. Gluteals: Normal. Hamstrings: Normal. Other findings No pelvic free fluid. No fluid collection or hematoma. No inguinal lymphadenopathy. No inguinal hernia. IMPRESSION: 1. No hip fracture, dislocation or avascular necrosis. 2. Left labral degeneration with a probable small superior anterior labral tear. Electronically Signed   By: Kathreen Devoid   On: 12/22/2020 12:36       Assessment & Plan:   Problem List Items Addressed This Visit    Change in bowel movement    Has seen GI. Doing better.  Follow.        Chronic systolic heart failure (HCC) (Chronic)   Relevant Orders   Basic metabolic panel   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HCC) - Primary (Chronic)    No evidence of volume overload.  On coreg.  Stable.  Follow.        GERD (gastroesophageal reflux disease)    Upper symptoms controlled on protonix.        Health care maintenance    Physical today 01/28/21.  Mammogram 10/26/20 - Birads I.  Colonoscopy 02/2017.  F/u in 5 years.        Hypercholesterolemia    On simvastatin.  Low cholesterol diet and exercise.  Follow  lipid panel and liver function tests.        Relevant Orders   CBC with Differential/Platelet   Lipid panel   Hepatic function panel   Hyperglycemia    Low carb diet and exercise.  Follow met b and a1c.       Relevant Orders   Hemoglobin A1c   Hyperthyroidism    Has been followed by endocrinology.  On tapazole.  She is unsure when f/u planned.  Will check and arrange if due.  Check Free T4 and tsh today.       Relevant Orders   TSH   T4, free   NSVT (nonsustained ventricular tachycardia) (HCC) (Chronic)    On coreg.  No increased heart rate or palpitations.  Follow.       Stress    Discussed.  Overall she feels she is doing well.  Does not feel needs any further intervention.  Follow.       SVT (supraventricular tachycardia) (HCC) (Chronic)    On coreg.  No increased heart rate or palpitations.  Follow.        Other Visit Diagnoses    Need for 23-polyvalent pneumococcal polysaccharide vaccine       Relevant Orders   Pneumococcal polysaccharide  vaccine 23-valent greater than or equal to 2yo subcutaneous/IM (Completed)       Einar Pheasant, MD

## 2021-01-28 NOTE — Assessment & Plan Note (Signed)
Physical today 01/28/21.  Mammogram 10/26/20 - Birads I.  Colonoscopy 02/2017.  F/u in 5 years.

## 2021-01-30 ENCOUNTER — Encounter: Payer: Self-pay | Admitting: Internal Medicine

## 2021-01-30 DIAGNOSIS — R198 Other specified symptoms and signs involving the digestive system and abdomen: Secondary | ICD-10-CM | POA: Insufficient documentation

## 2021-01-30 NOTE — Assessment & Plan Note (Signed)
Has seen GI. Doing better.  Follow.

## 2021-01-30 NOTE — Assessment & Plan Note (Signed)
No evidence of volume overload.  On coreg.  Stable.  Follow.

## 2021-01-30 NOTE — Assessment & Plan Note (Signed)
On coreg.  No increased heart rate or palpitations.  Follow.  

## 2021-01-30 NOTE — Assessment & Plan Note (Signed)
On simvastatin.  Low cholesterol diet and exercise.  Follow lipid panel and liver function tests.   

## 2021-01-30 NOTE — Assessment & Plan Note (Signed)
Discussed.  Overall she feels she is doing well.  Does not feel needs any further intervention.  Follow.

## 2021-01-30 NOTE — Assessment & Plan Note (Signed)
Upper symptoms controlled on protonix.  

## 2021-01-30 NOTE — Assessment & Plan Note (Signed)
Low carb diet and exercise.  Follow met b and a1c.

## 2021-01-30 NOTE — Assessment & Plan Note (Signed)
Has been followed by endocrinology.  On tapazole.  She is unsure when f/u planned.  Will check and arrange if due.  Check Free T4 and tsh today.

## 2021-01-31 ENCOUNTER — Telehealth: Payer: Self-pay | Admitting: Family

## 2021-01-31 DIAGNOSIS — E871 Hypo-osmolality and hyponatremia: Secondary | ICD-10-CM

## 2021-01-31 LAB — HEPATIC FUNCTION PANEL
ALT: 11 U/L (ref 0–35)
AST: 17 U/L (ref 0–37)
Albumin: 3.5 g/dL (ref 3.5–5.2)
Alkaline Phosphatase: 81 U/L (ref 39–117)
Bilirubin, Direct: 0.1 mg/dL (ref 0.0–0.3)
Total Bilirubin: 0.4 mg/dL (ref 0.2–1.2)
Total Protein: 6.6 g/dL (ref 6.0–8.3)

## 2021-01-31 LAB — LIPID PANEL
Cholesterol: 151 mg/dL (ref 0–200)
HDL: 64.1 mg/dL (ref >39.00)
LDL Cholesterol: 68 mg/dL (ref 0–99)
NonHDL: 87.34
Total CHOL/HDL Ratio: 2
Triglycerides: 97 mg/dL (ref 0.0–149.0)
VLDL: 19.4 mg/dL (ref 0.0–40.0)

## 2021-01-31 LAB — BASIC METABOLIC PANEL
BUN: 24 mg/dL — ABNORMAL HIGH (ref 6–23)
CO2: 25 mEq/L (ref 19–32)
Calcium: 9.6 mg/dL (ref 8.4–10.5)
Chloride: 95 mEq/L — ABNORMAL LOW (ref 96–112)
Creatinine, Ser: 0.98 mg/dL (ref 0.40–1.20)
GFR: 59.47 mL/min — ABNORMAL LOW (ref >60.00)
Glucose, Bld: 97 mg/dL (ref 70–99)
Potassium: 4.1 mEq/L (ref 3.5–5.1)
Sodium: 125 mEq/L — ABNORMAL LOW (ref 135–145)

## 2021-01-31 NOTE — Telephone Encounter (Signed)
Spoke with patient  She feels fine  Past couple weeks she had diarrhea, since resolved.  No non bloody diarrhea presently.  Upcoming colonoscopy for this reason.   No fever, vomiting, confusion.   Not drinking a lot of water.   She has not been on lasix. Denies leg swelling, orthopnea, sob, confusion, seizure  She declines labs today She will go to [facility] tomorrow

## 2021-02-01 ENCOUNTER — Other Ambulatory Visit
Admission: RE | Admit: 2021-02-01 | Discharge: 2021-02-01 | Disposition: A | Discharge status: Home / Self Care | Payer: Medicare Other | Attending: Family | Admitting: Family

## 2021-02-01 ENCOUNTER — Other Ambulatory Visit: Payer: Self-pay

## 2021-02-01 DIAGNOSIS — E871 Hypo-osmolality and hyponatremia: Secondary | ICD-10-CM | POA: Diagnosis not present

## 2021-02-01 LAB — BASIC METABOLIC PANEL
Anion gap: 6 (ref 5–15)
BUN: 21 mg/dL (ref 8–23)
CO2: 25 mmol/L (ref 22–32)
Calcium: 9.2 mg/dL (ref 8.9–10.3)
Chloride: 110 mmol/L (ref 98–111)
Creatinine, Ser: 0.86 mg/dL (ref 0.44–1.00)
GFR, Estimated: >60 mL/min (ref >60)
Glucose, Bld: 106 mg/dL — ABNORMAL HIGH (ref 70–99)
Potassium: 5 mmol/L (ref 3.5–5.1)
Sodium: 141 mmol/L (ref 135–145)

## 2021-02-21 DIAGNOSIS — D126 Benign neoplasm of colon, unspecified: Secondary | ICD-10-CM | POA: Diagnosis not present

## 2021-02-21 DIAGNOSIS — R194 Change in bowel habit: Secondary | ICD-10-CM | POA: Diagnosis not present

## 2021-02-21 DIAGNOSIS — R197 Diarrhea, unspecified: Secondary | ICD-10-CM | POA: Diagnosis not present

## 2021-02-21 DIAGNOSIS — D123 Benign neoplasm of transverse colon: Secondary | ICD-10-CM | POA: Diagnosis not present

## 2021-02-21 DIAGNOSIS — G4733 Obstructive sleep apnea (adult) (pediatric): Secondary | ICD-10-CM | POA: Diagnosis not present

## 2021-02-21 DIAGNOSIS — Z1211 Encounter for screening for malignant neoplasm of colon: Secondary | ICD-10-CM | POA: Diagnosis not present

## 2021-02-21 DIAGNOSIS — K635 Polyp of colon: Secondary | ICD-10-CM | POA: Diagnosis not present

## 2021-02-21 DIAGNOSIS — K640 First degree hemorrhoids: Secondary | ICD-10-CM | POA: Diagnosis not present

## 2021-03-07 DIAGNOSIS — M47816 Spondylosis without myelopathy or radiculopathy, lumbar region: Secondary | ICD-10-CM | POA: Diagnosis not present

## 2021-03-07 DIAGNOSIS — G8929 Other chronic pain: Secondary | ICD-10-CM | POA: Diagnosis not present

## 2021-03-07 DIAGNOSIS — M25552 Pain in left hip: Secondary | ICD-10-CM | POA: Diagnosis not present

## 2021-03-07 DIAGNOSIS — S76212A Strain of adductor muscle, fascia and tendon of left thigh, initial encounter: Secondary | ICD-10-CM | POA: Diagnosis not present

## 2021-03-07 HISTORY — DX: Spondylosis without myelopathy or radiculopathy, lumbar region: M47.816

## 2021-03-10 ENCOUNTER — Other Ambulatory Visit: Payer: Self-pay | Admitting: Internal Medicine

## 2021-04-20 DIAGNOSIS — Z86018 Personal history of other benign neoplasm: Secondary | ICD-10-CM | POA: Diagnosis not present

## 2021-04-20 DIAGNOSIS — L578 Other skin changes due to chronic exposure to nonionizing radiation: Secondary | ICD-10-CM | POA: Diagnosis not present

## 2021-04-20 DIAGNOSIS — Z859 Personal history of malignant neoplasm, unspecified: Secondary | ICD-10-CM | POA: Diagnosis not present

## 2021-04-20 DIAGNOSIS — L570 Actinic keratosis: Secondary | ICD-10-CM | POA: Diagnosis not present

## 2021-04-20 DIAGNOSIS — Z872 Personal history of diseases of the skin and subcutaneous tissue: Secondary | ICD-10-CM | POA: Diagnosis not present

## 2021-04-20 DIAGNOSIS — L821 Other seborrheic keratosis: Secondary | ICD-10-CM | POA: Diagnosis not present

## 2021-05-06 ENCOUNTER — Other Ambulatory Visit: Payer: Self-pay | Admitting: Internal Medicine

## 2021-06-02 ENCOUNTER — Other Ambulatory Visit: Payer: Self-pay | Admitting: Internal Medicine

## 2021-06-03 ENCOUNTER — Ambulatory Visit: Payer: Medicare Other | Admitting: Internal Medicine

## 2021-06-07 ENCOUNTER — Other Ambulatory Visit: Payer: Self-pay

## 2021-06-07 ENCOUNTER — Ambulatory Visit (INDEPENDENT_AMBULATORY_CARE_PROVIDER_SITE_OTHER): Payer: Medicare Other | Admitting: Internal Medicine

## 2021-06-07 VITALS — BP 118/76 | HR 81 | Temp 97.0°F | Resp 16 | Ht 61.0 in | Wt 151.6 lb

## 2021-06-07 DIAGNOSIS — I420 Dilated cardiomyopathy: Secondary | ICD-10-CM

## 2021-06-07 DIAGNOSIS — I5022 Chronic systolic (congestive) heart failure: Secondary | ICD-10-CM

## 2021-06-07 DIAGNOSIS — G43809 Other migraine, not intractable, without status migrainosus: Secondary | ICD-10-CM

## 2021-06-07 DIAGNOSIS — Z23 Encounter for immunization: Secondary | ICD-10-CM | POA: Diagnosis not present

## 2021-06-07 DIAGNOSIS — E7800 Pure hypercholesterolemia, unspecified: Secondary | ICD-10-CM | POA: Diagnosis not present

## 2021-06-07 DIAGNOSIS — E0590 Thyrotoxicosis, unspecified without thyrotoxic crisis or storm: Secondary | ICD-10-CM

## 2021-06-07 DIAGNOSIS — I471 Supraventricular tachycardia: Secondary | ICD-10-CM

## 2021-06-07 DIAGNOSIS — F439 Reaction to severe stress, unspecified: Secondary | ICD-10-CM

## 2021-06-07 DIAGNOSIS — R739 Hyperglycemia, unspecified: Secondary | ICD-10-CM | POA: Diagnosis not present

## 2021-06-07 DIAGNOSIS — K219 Gastro-esophageal reflux disease without esophagitis: Secondary | ICD-10-CM | POA: Diagnosis not present

## 2021-06-07 LAB — LIPID PANEL
Cholesterol: 152 mg/dL (ref 0–200)
HDL: 61.2 mg/dL (ref >39.00)
LDL Cholesterol: 66 mg/dL (ref 0–99)
NonHDL: 90.3
Total CHOL/HDL Ratio: 2
Triglycerides: 123 mg/dL (ref 0.0–149.0)
VLDL: 24.6 mg/dL (ref 0.0–40.0)

## 2021-06-07 LAB — HEPATIC FUNCTION PANEL
ALT: 11 U/L (ref 0–35)
AST: 18 U/L (ref 0–37)
Albumin: 4 g/dL (ref 3.5–5.2)
Alkaline Phosphatase: 104 U/L (ref 39–117)
Bilirubin, Direct: 0.1 mg/dL (ref 0.0–0.3)
Total Bilirubin: 0.5 mg/dL (ref 0.2–1.2)
Total Protein: 6.3 g/dL (ref 6.0–8.3)

## 2021-06-07 LAB — BASIC METABOLIC PANEL
BUN: 18 mg/dL (ref 6–23)
CO2: 29 mEq/L (ref 19–32)
Calcium: 9.6 mg/dL (ref 8.4–10.5)
Chloride: 106 mEq/L (ref 96–112)
Creatinine, Ser: 0.81 mg/dL (ref 0.40–1.20)
GFR: 74.56 mL/min (ref >60.00)
Glucose, Bld: 93 mg/dL (ref 70–99)
Potassium: 4.4 mEq/L (ref 3.5–5.1)
Sodium: 141 mEq/L (ref 135–145)

## 2021-06-07 LAB — TSH: TSH: 2.03 u[IU]/mL (ref 0.35–5.50)

## 2021-06-07 LAB — HEMOGLOBIN A1C: Hgb A1c MFr Bld: 6.1 % (ref 4.6–6.5)

## 2021-06-07 LAB — T4, FREE: Free T4: 0.62 ng/dL (ref 0.60–1.60)

## 2021-06-07 NOTE — Progress Notes (Signed)
[name], female   DOB: 12-Dec-1950, 70 y.o.   MRN: [id number]   Subjective:    [name], female    DOB: September 03, 1950, 70 y.o.   MRN: [id number]  This visit occurred during the SARS-CoV-2 public health emergency.  Safety protocols were in place, including screening questions prior to the visit, additional usage of staff PPE, and extensive cleaning of exam room while observing appropriate contact time as indicated for disinfecting solutions.   Patient here for a scheduled follow up.   Chief Complaint  Patient presents with   Hyperlipidemia   Hypertension   Gastroesophageal Reflux   .   HPI She is doing well.  States "feels fantastic".  Tries to stay active.  Playing golf.  Seeing Dr Roland Rack for hip pain.  Has noticed some left inner thigh pain - when walking.  Still active as outlined.  Following.  No chest pain.  Breathing stable.  No cough or congestion.  No acid reflux reported.  No abdominal pain or bowel change reported.  S/p colonoscopy 01/2021.     Past Medical History:  Diagnosis Date   Allergy    Atrial fibrillation ([place])    Cancer ([place])    basal cell   Cardiomyopathy, nonischemic (HCC)    Carpal tunnel syndrome    bilateral   Congestive heart failure ([place])    normalization of EF with medical therapy   Dysrhythmia 08/31/2015   long RP tachycardia   Environmental and seasonal allergies    Female cystocele    Hyperlipidemia    Hypertension    Hyperthyroidism    Migraines    Overactive detrusor    Personal history of colonic polyps    PONV (postoperative nausea and vomiting)    SUI (stress urinary incontinence, female)    Uterovaginal prolapse    UTI (urinary tract infection)    Vaginal atrophy    Past Surgical History:  Procedure Laterality Date   ANTERIOR AND POSTERIOR VAGINAL REPAIR W/ SACROSPINOUS LIGAMENT SUSPENSION  10/18/2012   CARDIAC CATHETERIZATION N/A 11/26/2015   Procedure: Right/Left Heart Cath and Coronary  Angiography;  Surgeon: Jolaine Artist, MD;  Location: [facility];  Service: Cardiovascular;  Laterality: N/A;   CHOLECYSTECTOMY  2006   COLONOSCOPY WITH PROPOFOL N/A 03/19/2017   Procedure: COLONOSCOPY WITH PROPOFOL;  Surgeon: Lollie Sails, MD;  Location: [facility] ENDOSCOPY;  Service: Endoscopy;  Laterality: N/A;   COLPOPEXY  10/18/2012   CYSTOURETHROSCOPY  10/18/2012   ESOPHAGOGASTRODUODENOSCOPY (EGD) WITH PROPOFOL N/A 10/03/2016   Procedure: ESOPHAGOGASTRODUODENOSCOPY (EGD) WITH PROPOFOL;  Surgeon: Lollie Sails, MD;  Location: [facility] ENDOSCOPY;  Service: Endoscopy;  Laterality: N/A;   EYE SURGERY     cataract extration   FINGER SURGERY     HYSTEROSCOPY  2011, 10/2010   OOPHORECTOMY     REVERSE SHOULDER ARTHROPLASTY Right 06/17/2019   Procedure: REVERSE SHOULDER ARTHROPLASTY;  Surgeon: Corky Mull, MD;  Location: [facility] ORS;  Service: Orthopedics;  Laterality: Right;   TONSILLECTOMY     TUBAL LIGATION     VAGINAL HYSTERECTOMY     midurethral sling   Family History  Problem Relation Age of Onset   Hypertension Mother    Stroke Mother    Diabetes Mother    Hypercholesterolemia Father    Heart disease Father        MI   Hypertension Father    Stroke Father    Breast cancer Neg Hx    Colon cancer  Neg Hx    Thyroid disease Neg Hx    Social History   Socioeconomic History   Marital status: Widowed    Spouse name: Not on file   Number of children: 2   Years of education: Not on file   Highest education level: Not on file  Occupational History   Not on file  Tobacco Use   Smoking status: Never   Smokeless tobacco: Never  Vaping Use   Vaping Use: Never used  Substance and Sexual Activity   Alcohol use: Yes    Alcohol/week: 0.0 standard drinks    Comment: Socially   Drug use: No   Sexual activity: Not on file  Other Topics Concern   Not on file  Social History Narrative   Not on file   Social Determinants of Health   Financial Resource Strain: Low  Risk    Difficulty of Paying Living Expenses: Not hard at all  Food Insecurity: No Food Insecurity   Worried About Charity fundraiser in the Last Year: Never true   [place] in the Last Year: Never true  Transportation Needs: No Transportation Needs   Lack of Transportation (Medical): No   Lack of Transportation (Non-Medical): No  Physical Activity: Not on file  Stress: No Stress Concern Present   Feeling of Stress : Not at all  Social Connections: Unknown   Frequency of Communication with Friends and Family: More than three times a week   Frequency of Social Gatherings with Friends and Family: More than three times a week   Attends Religious Services: Not on file   Active Member of Clubs or Organizations: Not on file   Attends Archivist Meetings: Not on file   Marital Status: Not on file     Review of Systems  Constitutional:  Negative for appetite change and unexpected weight change.  HENT:  Negative for congestion and sinus pressure.   Respiratory:  Negative for cough, chest tightness and shortness of breath.   Cardiovascular:  Negative for chest pain, palpitations and leg swelling.  Gastrointestinal:  Negative for abdominal pain, diarrhea, nausea and vomiting.  Genitourinary:  Negative for difficulty urinating and dysuria.  Musculoskeletal:  Negative for joint swelling and myalgias.       Hip/groin pain as outlined.  Followed by Dr Roland Rack.    Skin:  Negative for color change and rash.  Neurological:  Negative for dizziness, light-headedness and headaches.  Psychiatric/Behavioral:  Negative for agitation and dysphoric mood.       Objective:     BP 118/76   Pulse 81   Temp (!) 97 F (36.1 C)   Resp 16   Ht 5' 1"  (1.549 m)   Wt 151 lb 9.6 oz (68.8 kg)   LMP 10/18/2012   SpO2 99%   BMI 28.64 kg/m  Wt Readings from Last 3 Encounters:  06/07/21 151 lb 9.6 oz (68.8 kg)  01/28/21 148 lb 12.8 oz (67.5 kg)  10/22/20 147 lb (66.7 kg)    Physical  Exam Vitals reviewed.  Constitutional:      General: She is not in acute distress.    Appearance: Normal appearance.  HENT:     Head: Normocephalic and atraumatic.     Right Ear: External ear normal.     Left Ear: External ear normal.  Eyes:     General: No scleral icterus.       Right eye: No discharge.  Left eye: No discharge.     Conjunctiva/sclera: Conjunctivae normal.  Neck:     Thyroid: No thyromegaly.  Cardiovascular:     Rate and Rhythm: Normal rate and regular rhythm.  Pulmonary:     Effort: No respiratory distress.     Breath sounds: Normal breath sounds. No wheezing.  Abdominal:     General: Bowel sounds are normal.     Palpations: Abdomen is soft.     Tenderness: There is no abdominal tenderness.  Musculoskeletal:        General: No swelling or tenderness.     Cervical back: Neck supple. No tenderness.  Lymphadenopathy:     Cervical: No cervical adenopathy.  Skin:    Findings: No erythema or rash.  Neurological:     Mental Status: She is alert.  Psychiatric:        Mood and Affect: Mood normal.        Behavior: Behavior normal.     Outpatient Encounter Medications as of 06/07/2021  Medication Sig   acetaminophen (TYLENOL) 500 MG tablet Take 500-1,000 mg by mouth every 6 (six) hours as needed for moderate pain or headache.   ARTIFICIAL TEAR SOLUTION OP Place 1 drop into both eyes daily as needed (dry eyes).   calcium carbonate (OSCAL) 1500 (600 Ca) MG TABS tablet Take 1,500 mg by mouth daily.    calcium carbonate (TUMS - DOSED IN MG ELEMENTAL CALCIUM) 500 MG chewable tablet Chew 1 tablet by mouth daily as needed for indigestion or heartburn.   carvedilol (COREG) 12.5 MG tablet TAKE 1 TABLET (12.5 MG TOTAL) BY MOUTH 2 (TWO) TIMES DAILY WITH A MEAL.   Cholecalciferol 1000 units capsule Take 1,000 Units by mouth daily.   diphenhydramine-acetaminophen (TYLENOL PM) 25-500 MG TABS tablet Take 1 tablet by mouth at bedtime as needed (sleep).    fexofenadine  (ALLEGRA) 180 MG tablet Take 180 mg by mouth at bedtime.    furosemide (LASIX) 20 MG tablet TAKE 1 TABLET (20 MG TOTAL) BY MOUTH EVERY OTHER DAY.   methimazole (TAPAZOLE) 10 MG tablet TAKE 1 TABLET BY MOUTH DAILY (Patient taking differently: 5 mg.)   pantoprazole (PROTONIX) 40 MG tablet Take 40 mg by mouth daily.   rosuvastatin (CRESTOR) 10 MG tablet TAKE 1 TABLET BY MOUTH EVERY DAY   SUMAtriptan (IMITREX) 100 MG tablet TAKE 1 TABLET BY MOUTH FOR 1 DOSE. MAY REPEAT IN 2 HOURS IF HEADACHE PERSISTS OR RECURS   triamcinolone (NASACORT) 55 MCG/ACT AERO nasal inhaler Place 1 spray into the nose daily as needed (allergies).   zonisamide (ZONEGRAN) 100 MG capsule TAKE 1 CAPSULE BY MOUTH EVERY DAY   No facility-administered encounter medications on file as of 06/07/2021.     Lab Results  Component Value Date   WBC 7.3 01/28/2021   HGB 12.6 01/28/2021   HCT 38.7 01/28/2021   PLT 224.0 01/28/2021   GLUCOSE 93 06/07/2021   CHOL 152 06/07/2021   TRIG 123.0 06/07/2021   HDL 61.20 06/07/2021   LDLCALC 66 06/07/2021   ALT 11 06/07/2021   AST 18 06/07/2021   NA 141 06/07/2021   K 4.4 06/07/2021   CL 106 06/07/2021   CREATININE 0.81 06/07/2021   BUN 18 06/07/2021   CO2 29 06/07/2021   TSH 2.03 06/07/2021   INR 0.98 11/26/2015   HGBA1C 6.1 06/07/2021       Assessment & Plan:   Problem List Items Addressed This Visit     GERD (gastroesophageal reflux disease)  Upper symptoms controlled on protonix.        Hypercholesterolemia    On simvastatin.  Low cholesterol diet and exercise.  Follow lipid panel and liver function tests.        Relevant Orders   Basic metabolic panel (Completed)   Hepatic function panel (Completed)   Lipid panel (Completed)   Hyperglycemia - Primary    Low carb diet and exercise.  Follow met b and a1c.       Relevant Orders   Hemoglobin A1c (Completed)   Hyperthyroidism    Has been followed by endocrinology.  On tapazole.  She is unsure when f/u  planned.  Will check and arrange if due.  Check Free T4 and tsh today.       Relevant Orders   T4, free (Completed)   TSH (Completed)   Migraines    Stable.  Follow.       Stress    Doing well.  Feels good.  Playing golf.  Follow.       Chronic systolic heart failure (HCC) (Chronic)    Continue spironolactone, coreg and lasix.  No evidence of volume overload.  Follow.       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HCC) (Chronic)    No evidence of volume overload.  On coreg.  Stable.  Follow.        SVT (supraventricular tachycardia) (HCC) (Chronic)    On coreg.  No increased heart rate or palpitations.  Follow.       Other Visit Diagnoses     Need for immunization against influenza       Relevant Orders   Flu Vaccine QUAD High Dose(Fluad) (Completed)        Einar Pheasant, MD

## 2021-06-12 ENCOUNTER — Telehealth: Payer: Self-pay | Admitting: Internal Medicine

## 2021-06-12 ENCOUNTER — Encounter: Payer: Self-pay | Admitting: Internal Medicine

## 2021-06-12 NOTE — Assessment & Plan Note (Signed)
Continue spironolactone, coreg and lasix.  No evidence of volume overload.  Follow.

## 2021-06-12 NOTE — Assessment & Plan Note (Signed)
No evidence of volume overload.  On coreg.  Stable.  Follow.

## 2021-06-12 NOTE — Assessment & Plan Note (Signed)
On simvastatin.  Low cholesterol diet and exercise.  Follow lipid panel and liver function tests.   

## 2021-06-12 NOTE — Assessment & Plan Note (Signed)
On coreg.  No increased heart rate or palpitations.  Follow.  

## 2021-06-12 NOTE — Assessment & Plan Note (Signed)
Low carb diet and exercise.  Follow met b and a1c.  

## 2021-06-12 NOTE — Assessment & Plan Note (Signed)
Stable.  Follow.   

## 2021-06-12 NOTE — Telephone Encounter (Signed)
Kristi Greer has seen Dr Loanne Drilling for f/u of her thyroid. Overdue f/u.  Need to schedule.

## 2021-06-12 NOTE — Assessment & Plan Note (Signed)
Has been followed by endocrinology.  On tapazole.  She is unsure when f/u planned.  Will check and arrange if due.  Check Free T4 and tsh today.

## 2021-06-12 NOTE — Assessment & Plan Note (Signed)
Doing well.  Feels good.  Playing golf.  Follow.

## 2021-06-12 NOTE — Assessment & Plan Note (Signed)
Upper symptoms controlled on protonix.  

## 2021-06-13 NOTE — Telephone Encounter (Signed)
Patient aware.

## 2021-06-14 ENCOUNTER — Other Ambulatory Visit: Payer: Self-pay

## 2021-06-14 ENCOUNTER — Encounter: Payer: Self-pay | Admitting: Cardiovascular Disease

## 2021-06-14 ENCOUNTER — Ambulatory Visit (INDEPENDENT_AMBULATORY_CARE_PROVIDER_SITE_OTHER): Payer: Medicare Other | Admitting: Cardiovascular Disease

## 2021-06-14 VITALS — BP 110/60 | HR 60 | Ht 61.0 in | Wt 152.4 lb

## 2021-06-14 DIAGNOSIS — I471 Supraventricular tachycardia: Secondary | ICD-10-CM | POA: Diagnosis not present

## 2021-06-14 DIAGNOSIS — I4729 Other ventricular tachycardia: Secondary | ICD-10-CM

## 2021-06-14 DIAGNOSIS — E7800 Pure hypercholesterolemia, unspecified: Secondary | ICD-10-CM | POA: Diagnosis not present

## 2021-06-14 DIAGNOSIS — I428 Other cardiomyopathies: Secondary | ICD-10-CM | POA: Diagnosis not present

## 2021-06-14 DIAGNOSIS — I5022 Chronic systolic (congestive) heart failure: Secondary | ICD-10-CM | POA: Diagnosis not present

## 2021-06-14 NOTE — Progress Notes (Signed)
Cardiology Office Note  Date:  06/14/2021   ID:  Kristi Greer, Kristi Greer 07-13-1951, MRN [id number]  PCP:  Einar Pheasant, MD   Chief Complaint  Patient presents with   12 month follow up     Patient c/o shortness of breath with over exertion. Medications reviewed by the patient verbally.     HPI:  Kristi Greer is a 70 y/o woman with h/o obesity,  migraines  HTN  Nonischemic cardiomyopathy 08/2015 EF was 35 to 40% felt to be tachycardia mediated, Nonsustained VT Recovery and cardiac function, ejection fraction 55 to 60% April 2018 and 2019 Nonobstructive coronary disease on catheterization 10/2015 Who presents for follow-up of her cardiomyopathy  Weight stable Lasix PRN, 1x a week No palpitations Has some groin pain, because still sore, has been doing less walking Does gardening Active with Golf No regular exercise program  Stress: husband with cancer, stage 4 Melanoma, passed 08/2020  Completed reverse shoulder surgery on the right Dr. Roland Rack, oct 20th  2020  Echo 2019 Normal EF 50 to 15% moderate diastolic dysfunction  EKG personally reviewed by myself on todays visit Shows normal sinus rhythm rate 60  bpm no significant ST or T wave changes, pvc  Other past medical history reviewed with her on today's visit January 2017 admitted at [facility] with acute CHF and SVT (No palpitations at the time) Received adenosine but didn't break.  converted spontaneously.  Prior history of hyperthyroidism.  Treated with tapazole and a b-blocker.    Echo 4/18 EF 55-60% 30-d monitor 5/19:   - Normal sinus rhythm    - One 13-beat run NSVT on 5/12   - No other high-grade arrhythmias or blocks.     30-d monitor brief nonsustained VT for which she was asymptomatic. She has seen Dr. Rayann Heman. They discussed possible EPS but with normal EF have opted for medical therapy with carvedilol unless EF drops or has recurrent arrhythmias or worsening symptoms  cMRI 03/2016 LVEF 48% RV normal. No  evidence of infiltrative disease.    On 10/11/15 had Myoview EF 30% No ischemia or scar.  Event monitor 4/17: NSR one 7 beat NSVT. No SVT   Echo 01/31/16 LVEF 40-45%, Grade 1 DD, Normal RV   Previous 30-day monitor in May 2019 One 13-beat run NSVT on 5/12   PMH:   has a past medical history of Allergy, Atrial fibrillation ([place]), Cancer ([place]), Cardiomyopathy, nonischemic ([place]), Carpal tunnel syndrome, Congestive heart failure ([place]), Dysrhythmia (08/31/2015), Environmental and seasonal allergies, Female cystocele, Hyperlipidemia, Hypertension, Hyperthyroidism, Migraines, Overactive detrusor, Personal history of colonic polyps, PONV (postoperative nausea and vomiting), SUI (stress urinary incontinence, female), Uterovaginal prolapse, UTI (urinary tract infection), and Vaginal atrophy.  PSH:    Past Surgical History:  Procedure Laterality Date   ANTERIOR AND POSTERIOR VAGINAL REPAIR W/ SACROSPINOUS LIGAMENT SUSPENSION  10/18/2012   CARDIAC CATHETERIZATION N/A 11/26/2015   Procedure: Right/Left Heart Cath and Coronary Angiography;  Surgeon: Jolaine Artist, MD;  Location: [facility];  Service: Cardiovascular;  Laterality: N/A;   CHOLECYSTECTOMY  2006   COLONOSCOPY WITH PROPOFOL N/A 03/19/2017   Procedure: COLONOSCOPY WITH PROPOFOL;  Surgeon: Lollie Sails, MD;  Location: [facility] ENDOSCOPY;  Service: Endoscopy;  Laterality: N/A;   COLPOPEXY  10/18/2012   CYSTOURETHROSCOPY  10/18/2012   ESOPHAGOGASTRODUODENOSCOPY (EGD) WITH PROPOFOL N/A 10/03/2016   Procedure: ESOPHAGOGASTRODUODENOSCOPY (EGD) WITH PROPOFOL;  Surgeon: Lollie Sails, MD;  Location: [facility] ENDOSCOPY;  Service: Endoscopy;  Laterality: N/A;   EYE SURGERY  cataract extration   FINGER SURGERY     HYSTEROSCOPY  2011, 10/2010   OOPHORECTOMY     REVERSE SHOULDER ARTHROPLASTY Right 06/17/2019   Procedure: REVERSE SHOULDER ARTHROPLASTY;  Surgeon: Corky Mull, MD;  Location: [facility] ORS;  Service: Orthopedics;  Laterality:  Right;   TONSILLECTOMY     TUBAL LIGATION     VAGINAL HYSTERECTOMY     midurethral sling    Current Outpatient Medications  Medication Sig Dispense Refill   acetaminophen (TYLENOL) 500 MG tablet Take 500-1,000 mg by mouth every 6 (six) hours as needed for moderate pain or headache.     ARTIFICIAL TEAR SOLUTION OP Place 1 drop into both eyes daily as needed (dry eyes).     calcium carbonate (OSCAL) 1500 (600 Ca) MG TABS tablet Take 1,500 mg by mouth daily.      calcium carbonate (TUMS - DOSED IN MG ELEMENTAL CALCIUM) 500 MG chewable tablet Chew 1 tablet by mouth daily as needed for indigestion or heartburn.     carvedilol (COREG) 12.5 MG tablet TAKE 1 TABLET (12.5 MG TOTAL) BY MOUTH 2 (TWO) TIMES DAILY WITH A MEAL. 180 tablet 2   Cholecalciferol 1000 units capsule Take 1,000 Units by mouth daily.     diphenhydramine-acetaminophen (TYLENOL PM) 25-500 MG TABS tablet Take 1 tablet by mouth at bedtime as needed (sleep).      fexofenadine (ALLEGRA) 180 MG tablet Take 180 mg by mouth at bedtime.      furosemide (LASIX) 20 MG tablet TAKE 1 TABLET (20 MG TOTAL) BY MOUTH EVERY OTHER DAY. 45 tablet 3   methimazole (TAPAZOLE) 10 MG tablet TAKE 1 TABLET BY MOUTH DAILY (Patient taking differently: 5 mg.) 90 tablet 3   pantoprazole (PROTONIX) 40 MG tablet Take 40 mg by mouth daily.     rosuvastatin (CRESTOR) 10 MG tablet TAKE 1 TABLET BY MOUTH EVERY DAY 90 tablet 1   SUMAtriptan (IMITREX) 100 MG tablet TAKE 1 TABLET BY MOUTH FOR 1 DOSE. MAY REPEAT IN 2 HOURS IF HEADACHE PERSISTS OR RECURS 9 tablet 0   triamcinolone (NASACORT) 55 MCG/ACT AERO nasal inhaler Place 1 spray into the nose daily as needed (allergies).     zonisamide (ZONEGRAN) 100 MG capsule TAKE 1 CAPSULE BY MOUTH EVERY DAY 90 capsule 0   No current facility-administered medications for this visit.     Allergies:   Patient has no known allergies.   Social History:  The patient  reports that she has never smoked. She has never used  smokeless tobacco. She reports current alcohol use. She reports that she does not use drugs.   Family History:   family history includes Diabetes in her mother; Heart disease in her father; Hypercholesterolemia in her father; Hypertension in her father and mother; Stroke in her father and mother.    Review of Systems: Review of Systems  Constitutional: Negative.   HENT: Negative.    Respiratory: Negative.    Cardiovascular: Negative.   Gastrointestinal: Negative.   Musculoskeletal: Negative.   Neurological: Negative.   Psychiatric/Behavioral: Negative.    All other systems reviewed and are negative.  PHYSICAL EXAM: VS:  BP 110/60 (BP Location: Left Arm, Patient Position: Sitting, Cuff Size: Normal)   Pulse 60   Ht 5\' 1"  (1.549 m)   Wt 152 lb 6 oz (69.1 kg)   LMP 10/18/2012   BMI 28.79 kg/m  , BMI Body mass index is 28.79 kg/m. Constitutional:  oriented to person, place, and time. No distress.  HENT:  Head: Grossly normal Eyes:  no discharge. No scleral icterus.  Neck: No JVD, no carotid bruits  Cardiovascular: Regular rate and rhythm, no murmurs appreciated Pulmonary/Chest: Clear to auscultation bilaterally, no wheezes or rails Abdominal: Soft.  no distension.  no tenderness.  Musculoskeletal: Normal range of motion Neurological:  normal muscle tone. Coordination normal. No atrophy Skin: Skin warm and dry Psychiatric: normal affect, pleasant   Recent Labs: 01/28/2021: Hemoglobin 12.6; Platelets 224.0 06/07/2021: ALT 11; BUN 18; Creatinine, Ser 0.81; Potassium 4.4; Sodium 141; TSH 2.03    Lipid Panel Lab Results  Component Value Date   CHOL 152 06/07/2021   HDL 61.20 06/07/2021   LDLCALC 66 06/07/2021   TRIG 123.0 06/07/2021      Wt Readings from Last 3 Encounters:  06/14/21 152 lb 6 oz (69.1 kg)  06/07/21 151 lb 9.6 oz (68.8 kg)  01/28/21 148 lb 12.8 oz (67.5 kg)     ASSESSMENT AND PLAN:   NSVT (nonsustained ventricular tachycardia) (HCC) Tolerating  carvedilol 12.5 twice daily Prior telemetry monitor 2019 with rare episode of nonsustained VT Denies any tachypalpitations concerning for arrhythmia, continue beta-blocker at current dose  NICM (nonischemic cardiomyopathy) ([place]) normalized ejection fraction on echocardiogram Chronically low blood pressure, relatively asymptomatic Unable to advance the medications Recommend she closely watch for leg swelling, abdominal distention, weight gain, PND orthopnea  Chronic systolic heart failure ([place]) - Plan: EKG 12-Lead Continue current medications, euvolemic Ejection fraction 50 to 55% 2019, Asymptomatic Lasix once a week, recommended extra Lasix as needed for ankle swelling  SVT (supraventricular tachycardia) ([place]) Denies tachycardia palpitations  continue Coreg 12.5 twice daily    Total encounter time more than 25 minutes  Greater than 50% was spent in counseling and coordination of care with the patient     Signed, Esmond Plants, M.D., Ph.D. 06/14/2021  [place], [place] [phone]

## 2021-06-14 NOTE — Patient Instructions (Addendum)
Medication Instructions:  No changes  If you need a refill on your cardiac medications before your next appointment, please call your pharmacy.   Lab work: No new labs needed  Testing/Procedures: No new testing needed  Follow-Up: At [facility], you and your health needs are our priority.  As part of our continuing mission to provide you with exceptional heart care, we have created designated Provider Care Teams.  These Care Teams include your primary Cardiologist (physician) and Advanced Practice Providers (APPs -  Physician Assistants and Nurse Practitioners) who all work together to provide you with the care you need, when you need it.  You will need a follow up appointment in 12 months  Providers on your designated Care Team:   Christopher Berge, NP Ryan Dunn, PA-C Jacquelyn Visser, PA-C Cadence Furth, PA-C  COVID-19 Vaccine Information can be found at: [email or web address] For questions related to vaccine distribution or appointments, please email [email or web address] or call [phone].    

## 2021-07-04 ENCOUNTER — Other Ambulatory Visit: Payer: Self-pay

## 2021-07-04 ENCOUNTER — Ambulatory Visit: Payer: Medicare Other | Admitting: Endocrinology

## 2021-07-04 ENCOUNTER — Ambulatory Visit (INDEPENDENT_AMBULATORY_CARE_PROVIDER_SITE_OTHER): Payer: Medicare Other | Admitting: Endocrinology

## 2021-07-04 VITALS — BP 100/60 | HR 82 | Ht 61.0 in | Wt 153.0 lb

## 2021-07-04 DIAGNOSIS — E0590 Thyrotoxicosis, unspecified without thyrotoxic crisis or storm: Secondary | ICD-10-CM | POA: Diagnosis not present

## 2021-07-04 MED ORDER — METHIMAZOLE 5 MG PO TABS: 5.0000 mg | ORAL_TABLET | Freq: Every day | ORAL | 3 refills | Status: DC

## 2021-07-04 NOTE — Patient Instructions (Signed)
Please continue the same methimazole. If ever you have fever while taking methimazole, stop it and call us, even if the reason is obvious, because of the risk of a rare side-effect.    It is best to never miss the medication.  However, if you do miss it, next best is to double up the next time.   If this blood test is normal, please come back for a follow-up appointment in 6 months.

## 2021-07-04 NOTE — Progress Notes (Signed)
Subjective:    [name], female    DOB: 1951-06-06, 70 y.o.   MRN: [id number]  HPI Pt returns for f/u of hyperthyroidism (dx'ed 2015, when she presented with SVT; she was rx'ed with an uncertain medication for a few months in 2015; she has never had thyroid imaging; she declines RAI).  pt states she feels not different, and well in general.  Specifically, she denies palpitations, excessive diaphoresis, and tremor.  She takes tapazole as rx'ed.   Past Medical History:  Diagnosis Date   Allergy    Atrial fibrillation ([place])    Cancer ([place])    basal cell   Cardiomyopathy, nonischemic (HCC)    Carpal tunnel syndrome    bilateral   Congestive heart failure ([place])    normalization of EF with medical therapy   Dysrhythmia 08/31/2015   long RP tachycardia   Environmental and seasonal allergies    Female cystocele    Hyperlipidemia    Hypertension    Hyperthyroidism    Migraines    Overactive detrusor    Personal history of colonic polyps    PONV (postoperative nausea and vomiting)    SUI (stress urinary incontinence, female)    Uterovaginal prolapse    UTI (urinary tract infection)    Vaginal atrophy     Past Surgical History:  Procedure Laterality Date   ANTERIOR AND POSTERIOR VAGINAL REPAIR W/ SACROSPINOUS LIGAMENT SUSPENSION  10/18/2012   CARDIAC CATHETERIZATION N/A 11/26/2015   Procedure: Right/Left Heart Cath and Coronary Angiography;  Surgeon: Jolaine Artist, MD;  Location: [place] [facility];  Service: Cardiovascular;  Laterality: N/A;   CHOLECYSTECTOMY  2006   COLONOSCOPY WITH PROPOFOL N/A 03/19/2017   Procedure: COLONOSCOPY WITH PROPOFOL;  Surgeon: Lollie Sails, MD;  Location: [facility] ENDOSCOPY;  Service: Endoscopy;  Laterality: N/A;   COLPOPEXY  10/18/2012   CYSTOURETHROSCOPY  10/18/2012   ESOPHAGOGASTRODUODENOSCOPY (EGD) WITH PROPOFOL N/A 10/03/2016   Procedure: ESOPHAGOGASTRODUODENOSCOPY (EGD) WITH PROPOFOL;  Surgeon: Lollie Sails, MD;   Location: [facility] ENDOSCOPY;  Service: Endoscopy;  Laterality: N/A;   EYE SURGERY     cataract extration   FINGER SURGERY     HYSTEROSCOPY  2011, 10/2010   OOPHORECTOMY     REVERSE SHOULDER ARTHROPLASTY Right 06/17/2019   Procedure: REVERSE SHOULDER ARTHROPLASTY;  Surgeon: Corky Mull, MD;  Location: [facility] ORS;  Service: Orthopedics;  Laterality: Right;   TONSILLECTOMY     TUBAL LIGATION     VAGINAL HYSTERECTOMY     midurethral sling    Social History   Socioeconomic History   Marital status: Widowed    Spouse name: Not on file   Number of children: 2   Years of education: Not on file   Highest education level: Not on file  Occupational History   Not on file  Tobacco Use   Smoking status: Never   Smokeless tobacco: Never  Vaping Use   Vaping Use: Never used  Substance and Sexual Activity   Alcohol use: Yes    Alcohol/week: 0.0 standard drinks    Comment: Socially   Drug use: No   Sexual activity: Not on file  Other Topics Concern   Not on file  Social History Narrative   Not on file   Social Determinants of Health   Financial Resource Strain: Low Risk    Difficulty of Paying Living Expenses: Not hard at all  Food Insecurity: No Food Insecurity   Worried About Charity fundraiser in  the Last Year: Never true   Ran Out of Food in the Last Year: Never true  Transportation Needs: No Transportation Needs   Lack of Transportation (Medical): No   Lack of Transportation (Non-Medical): No  Physical Activity: Not on file  Stress: No Stress Concern Present   Feeling of Stress : Not at all  Social Connections: Unknown   Frequency of Communication with Friends and Family: More than three times a week   Frequency of Social Gatherings with Friends and Family: More than three times a week   Attends Religious Services: Not on Electrical engineer or Organizations: Not on file   Attends Archivist Meetings: Not on file   Marital Status: Not on file  Intimate  Partner Violence: Not At Risk   Fear of Current or Ex-Partner: No   Emotionally Abused: No   Physically Abused: No   Sexually Abused: No    Current Outpatient Medications on File Prior to Visit  Medication Sig Dispense Refill   acetaminophen (TYLENOL) 500 MG tablet Take 500-1,000 mg by mouth every 6 (six) hours as needed for moderate pain or headache.     ARTIFICIAL TEAR SOLUTION OP Place 1 drop into both eyes daily as needed (dry eyes).     calcium carbonate (OSCAL) 1500 (600 Ca) MG TABS tablet Take 1,500 mg by mouth daily.      calcium carbonate (TUMS - DOSED IN MG ELEMENTAL CALCIUM) 500 MG chewable tablet Chew 1 tablet by mouth daily as needed for indigestion or heartburn.     carvedilol (COREG) 12.5 MG tablet TAKE 1 TABLET (12.5 MG TOTAL) BY MOUTH 2 (TWO) TIMES DAILY WITH A MEAL. 180 tablet 2   Cholecalciferol 1000 units capsule Take 1,000 Units by mouth daily.     diphenhydramine-acetaminophen (TYLENOL PM) 25-500 MG TABS tablet Take 1 tablet by mouth at bedtime as needed (sleep).      fexofenadine (ALLEGRA) 180 MG tablet Take 180 mg by mouth at bedtime.      furosemide (LASIX) 20 MG tablet TAKE 1 TABLET (20 MG TOTAL) BY MOUTH EVERY OTHER DAY. 45 tablet 3   pantoprazole (PROTONIX) 40 MG tablet Take 40 mg by mouth daily.     rosuvastatin (CRESTOR) 10 MG tablet TAKE 1 TABLET BY MOUTH EVERY DAY 90 tablet 1   SUMAtriptan (IMITREX) 100 MG tablet TAKE 1 TABLET BY MOUTH FOR 1 DOSE. MAY REPEAT IN 2 HOURS IF HEADACHE PERSISTS OR RECURS 9 tablet 0   triamcinolone (NASACORT) 55 MCG/ACT AERO nasal inhaler Place 1 spray into the nose daily as needed (allergies).     zonisamide (ZONEGRAN) 100 MG capsule TAKE 1 CAPSULE BY MOUTH EVERY DAY 90 capsule 0   No current facility-administered medications on file prior to visit.    No Known Allergies  Family History  Problem Relation Age of Onset   Hypertension Mother    Stroke Mother    Diabetes Mother    Hypercholesterolemia Father    Heart disease  Father        MI   Hypertension Father    Stroke Father    Breast cancer Neg Hx    Colon cancer Neg Hx    Thyroid disease Neg Hx     BP 100/60 (BP Location: Right Arm, Patient Position: Sitting, Cuff Size: Normal)   Pulse 82   Ht 5\' 1"  (1.549 m)   Wt 153 lb (69.4 kg)   LMP 10/18/2012   SpO2 97%  BMI 28.91 kg/m    Review of Systems Denies fever.      Objective:   Physical Exam VITAL SIGNS:  See vs page.  GENERAL: no distress.  NECK: There is no palpable thyroid enlargement.  No thyroid nodule is palpable.  No palpable lymphadenopathy at the anterior neck.    Lab Results  Component Value Date   TSH 2.03 06/07/2021   T4TOTAL 7.3 09/02/2015       Assessment & Plan:  Hyperthyroidism.  well-controlled.    Patient Instructions  Please continue the same methimazole. If ever you have fever while taking methimazole, stop it and call us, even if the reason is obvious, because of the risk of a rare side-effect.    It is best to never miss the medication.  However, if you do miss it, next best is to double up the next time.   If this blood test is normal, please come back for a follow-up appointment in 6 months.

## 2021-07-22 ENCOUNTER — Other Ambulatory Visit: Payer: Self-pay | Admitting: Internal Medicine

## 2021-08-03 ENCOUNTER — Other Ambulatory Visit: Payer: Self-pay | Admitting: Student

## 2021-08-03 DIAGNOSIS — M5416 Radiculopathy, lumbar region: Secondary | ICD-10-CM

## 2021-08-03 DIAGNOSIS — M65331 Trigger finger, right middle finger: Secondary | ICD-10-CM | POA: Diagnosis not present

## 2021-08-03 DIAGNOSIS — M4807 Spinal stenosis, lumbosacral region: Secondary | ICD-10-CM

## 2021-08-03 DIAGNOSIS — G5602 Carpal tunnel syndrome, left upper limb: Secondary | ICD-10-CM | POA: Diagnosis not present

## 2021-08-03 DIAGNOSIS — M47816 Spondylosis without myelopathy or radiculopathy, lumbar region: Secondary | ICD-10-CM

## 2021-08-09 ENCOUNTER — Other Ambulatory Visit: Payer: Self-pay

## 2021-08-09 ENCOUNTER — Ambulatory Visit
Admission: RE | Admit: 2021-08-09 | Discharge: 2021-08-09 | Disposition: A | Discharge status: Home / Self Care | Payer: Medicare Other | Source: Ambulatory Visit | Attending: Student | Admitting: Student

## 2021-08-09 DIAGNOSIS — M47816 Spondylosis without myelopathy or radiculopathy, lumbar region: Secondary | ICD-10-CM | POA: Diagnosis not present

## 2021-08-09 DIAGNOSIS — M5416 Radiculopathy, lumbar region: Secondary | ICD-10-CM | POA: Diagnosis not present

## 2021-08-09 DIAGNOSIS — M4807 Spinal stenosis, lumbosacral region: Secondary | ICD-10-CM

## 2021-08-28 ENCOUNTER — Other Ambulatory Visit: Payer: Self-pay | Admitting: Cardiovascular Disease

## 2021-08-31 ENCOUNTER — Other Ambulatory Visit: Payer: Self-pay | Admitting: Internal Medicine

## 2021-08-31 DIAGNOSIS — M65331 Trigger finger, right middle finger: Secondary | ICD-10-CM | POA: Diagnosis not present

## 2021-10-01 ENCOUNTER — Other Ambulatory Visit: Payer: Self-pay | Admitting: Internal Medicine

## 2021-10-10 ENCOUNTER — Encounter: Payer: Self-pay | Admitting: Internal Medicine

## 2021-10-10 ENCOUNTER — Other Ambulatory Visit: Payer: Self-pay

## 2021-10-10 ENCOUNTER — Ambulatory Visit (INDEPENDENT_AMBULATORY_CARE_PROVIDER_SITE_OTHER): Payer: Medicare Other | Admitting: Internal Medicine

## 2021-10-10 DIAGNOSIS — E7800 Pure hypercholesterolemia, unspecified: Secondary | ICD-10-CM | POA: Diagnosis not present

## 2021-10-10 DIAGNOSIS — I471 Supraventricular tachycardia, unspecified: Secondary | ICD-10-CM

## 2021-10-10 DIAGNOSIS — Z860100 Personal history of colon polyps, unspecified: Secondary | ICD-10-CM

## 2021-10-10 DIAGNOSIS — R739 Hyperglycemia, unspecified: Secondary | ICD-10-CM | POA: Diagnosis not present

## 2021-10-10 DIAGNOSIS — Z1231 Encounter for screening mammogram for malignant neoplasm of breast: Secondary | ICD-10-CM | POA: Diagnosis not present

## 2021-10-10 DIAGNOSIS — R339 Retention of urine, unspecified: Secondary | ICD-10-CM

## 2021-10-10 DIAGNOSIS — E0590 Thyrotoxicosis, unspecified without thyrotoxic crisis or storm: Secondary | ICD-10-CM | POA: Diagnosis not present

## 2021-10-10 DIAGNOSIS — I5022 Chronic systolic (congestive) heart failure: Secondary | ICD-10-CM | POA: Diagnosis not present

## 2021-10-10 DIAGNOSIS — K219 Gastro-esophageal reflux disease without esophagitis: Secondary | ICD-10-CM | POA: Diagnosis not present

## 2021-10-10 LAB — CBC WITH DIFFERENTIAL/PLATELET
Basophils Absolute: 0.1 10*3/uL (ref 0.0–0.1)
Basophils Relative: 0.7 % (ref 0.0–3.0)
Eosinophils Absolute: 0.2 10*3/uL (ref 0.0–0.7)
Eosinophils Relative: 2.9 % (ref 0.0–5.0)
HCT: 39.4 % (ref 36.0–46.0)
Hemoglobin: 12.6 g/dL (ref 12.0–15.0)
Lymphocytes Relative: 31.3 % (ref 12.0–46.0)
Lymphs Abs: 2.3 10*3/uL (ref 0.7–4.0)
MCHC: 31.9 g/dL (ref 30.0–36.0)
MCV: 83.9 fl (ref 78.0–100.0)
Monocytes Absolute: 0.5 10*3/uL (ref 0.1–1.0)
Monocytes Relative: 6.3 % (ref 3.0–12.0)
Neutro Abs: 4.4 10*3/uL (ref 1.4–7.7)
Neutrophils Relative %: 58.8 % (ref 43.0–77.0)
Platelets: 200 10*3/uL (ref 150.0–400.0)
RBC: 4.7 Mil/uL (ref 3.87–5.11)
RDW: 14.8 % (ref 11.5–15.5)
WBC: 7.4 10*3/uL (ref 4.0–10.5)

## 2021-10-10 LAB — HEMOGLOBIN A1C: Hgb A1c MFr Bld: 6.1 % (ref 4.6–6.5)

## 2021-10-10 NOTE — Progress Notes (Signed)
Patient ID: ZERIYAH WAIN, female   DOB: 1951-02-10, 71 y.o.   MRN: [id number]   Subjective:    Patient ID: [name], female    DOB: 30-Dec-1950, 71 y.o.   MRN: [id number]  This visit occurred during the SARS-CoV-2 public health emergency.  Safety protocols were in place, including screening questions prior to the visit, additional usage of staff PPE, and extensive cleaning of exam room while observing appropriate contact time as indicated for disinfecting solutions.   Patient here for a scheduled follow up.   Chief Complaint  Patient presents with   Hypothyroidism   Hyperlipidemia   .   Hyperlipidemia Pertinent negatives include no chest pain or myalgias.  Reports she is doing relatively well.  Breathing overall is stable.  She is not as active now with the cold weather, but has started an exercise walking routine at home.  No chest pain.  No increased heart rate or palpitations.  No nausea or vomiting.  Some urgency - occasional - soft stool.  Discussed fiber.  No abdominal pain.  S/p trigger finger release (right third).  Doing better.  Taking lasix one x/week.  Is having problems with her bladder - increased frequency and nocturia.  Feels not emptying bladder fully.  Has been checked previously for sleep apnea.    Past Medical History:  Diagnosis Date   Allergy    Atrial fibrillation ([place])    Cancer ([place])    basal cell   Cardiomyopathy, nonischemic (HCC)    Carpal tunnel syndrome    bilateral   Congestive heart failure ([place])    normalization of EF with medical therapy   Dysrhythmia 08/31/2015   long RP tachycardia   Environmental and seasonal allergies    Female cystocele    Hyperlipidemia    Hypertension    Hyperthyroidism    Migraines    Overactive detrusor    Personal history of colonic polyps    PONV (postoperative nausea and vomiting)    SUI (stress urinary incontinence, female)    Uterovaginal prolapse    UTI (urinary tract infection)    Vaginal  atrophy    Past Surgical History:  Procedure Laterality Date   ANTERIOR AND POSTERIOR VAGINAL REPAIR W/ SACROSPINOUS LIGAMENT SUSPENSION  10/18/2012   CARDIAC CATHETERIZATION N/A 11/26/2015   Procedure: Right/Left Heart Cath and Coronary Angiography;  Surgeon: Jolaine Artist, MD;  Location: [place] [facility];  Service: Cardiovascular;  Laterality: N/A;   CHOLECYSTECTOMY  2006   COLONOSCOPY WITH PROPOFOL N/A 03/19/2017   Procedure: COLONOSCOPY WITH PROPOFOL;  Surgeon: Lollie Sails, MD;  Location: [facility] ENDOSCOPY;  Service: Endoscopy;  Laterality: N/A;   COLPOPEXY  10/18/2012   CYSTOURETHROSCOPY  10/18/2012   ESOPHAGOGASTRODUODENOSCOPY (EGD) WITH PROPOFOL N/A 10/03/2016   Procedure: ESOPHAGOGASTRODUODENOSCOPY (EGD) WITH PROPOFOL;  Surgeon: Lollie Sails, MD;  Location: [facility] ENDOSCOPY;  Service: Endoscopy;  Laterality: N/A;   EYE SURGERY     cataract extration   FINGER SURGERY     HYSTEROSCOPY  2011, 10/2010   OOPHORECTOMY     REVERSE SHOULDER ARTHROPLASTY Right 06/17/2019   Procedure: REVERSE SHOULDER ARTHROPLASTY;  Surgeon: Corky Mull, MD;  Location: [facility] ORS;  Service: Orthopedics;  Laterality: Right;   TONSILLECTOMY     TUBAL LIGATION     VAGINAL HYSTERECTOMY     midurethral sling   Family History  Problem Relation Age of Onset   Hypertension Mother    Stroke Mother    Diabetes Mother  Hypercholesterolemia Father    Heart disease Father        MI   Hypertension Father    Stroke Father    Breast cancer Neg Hx    Colon cancer Neg Hx    Thyroid disease Neg Hx    Social History   Socioeconomic History   Marital status: Widowed    Spouse name: Not on file   Number of children: 2   Years of education: Not on file   Highest education level: Not on file  Occupational History   Not on file  Tobacco Use   Smoking status: Never   Smokeless tobacco: Never  Vaping Use   Vaping Use: Never used  Substance and Sexual Activity   Alcohol use: Yes    Alcohol/week:  0.0 standard drinks    Comment: Socially   Drug use: No   Sexual activity: Not on file  Other Topics Concern   Not on file  Social History Narrative   Not on file   Social Determinants of Health   Financial Resource Strain: Low Risk    Difficulty of Paying Living Expenses: Not hard at all  Food Insecurity: No Food Insecurity   Worried About Charity fundraiser in the Last Year: Never true   [place] in the Last Year: Never true  Transportation Needs: No Transportation Needs   Lack of Transportation (Medical): No   Lack of Transportation (Non-Medical): No  Physical Activity: Not on file  Stress: No Stress Concern Present   Feeling of Stress : Not at all  Social Connections: Unknown   Frequency of Communication with Friends and Family: More than three times a week   Frequency of Social Gatherings with Friends and Family: More than three times a week   Attends Religious Services: Not on file   Active Member of Clubs or Organizations: Not on file   Attends Archivist Meetings: Not on file   Marital Status: Not on file     Review of Systems  Constitutional:  Negative for appetite change and unexpected weight change.  HENT:  Negative for congestion and sinus pressure.   Respiratory:  Negative for cough and chest tightness.        Breathing stable.   Cardiovascular:  Negative for chest pain, palpitations and leg swelling.  Gastrointestinal:  Negative for abdominal pain, diarrhea, nausea and vomiting.  Genitourinary:  Positive for frequency. Negative for dysuria.       Urinary frequency and nocturia.  Feels like she is not emptying her bladder.   Musculoskeletal:  Negative for joint swelling and myalgias.  Skin:  Negative for color change and rash.  Neurological:  Negative for dizziness, light-headedness and headaches.  Psychiatric/Behavioral:  Negative for agitation and dysphoric mood.       Objective:     BP 108/68    Pulse 87    Temp 97.9 F (36.6 C)     Resp 16    Ht _0  (1.549 m)    Wt 161 lb 9.6 oz (73.3 kg)    LMP 10/18/2012    SpO2 97%    BMI 30.53 kg/m  Wt Readings from Last 3 Encounters:  10/10/21 161 lb 9.6 oz (73.3 kg)  07/04/21 153 lb (69.4 kg)  06/14/21 152 lb 6 oz (69.1 kg)    Physical Exam Vitals reviewed.  Constitutional:      General: She is not in acute distress.    Appearance: Normal appearance.  HENT:  Head: Normocephalic and atraumatic.     Right Ear: External ear normal.     Left Ear: External ear normal.  Eyes:     General: No scleral icterus.       Right eye: No discharge.        Left eye: No discharge.     Conjunctiva/sclera: Conjunctivae normal.  Neck:     Thyroid: No thyromegaly.  Cardiovascular:     Rate and Rhythm: Normal rate and regular rhythm.  Pulmonary:     Effort: No respiratory distress.     Breath sounds: Normal breath sounds. No wheezing.  Abdominal:     General: Bowel sounds are normal.     Palpations: Abdomen is soft.     Tenderness: There is no abdominal tenderness.  Musculoskeletal:        General: No swelling or tenderness.     Cervical back: Neck supple. No tenderness.  Lymphadenopathy:     Cervical: No cervical adenopathy.  Skin:    Findings: No erythema or rash.  Neurological:     Mental Status: She is alert.  Psychiatric:        Mood and Affect: Mood normal.        Behavior: Behavior normal.     Outpatient Encounter Medications as of 10/10/2021  Medication Sig   acetaminophen (TYLENOL) 500 MG tablet Take 500-1,000 mg by mouth every 6 (six) hours as needed for moderate pain or headache.   ARTIFICIAL TEAR SOLUTION OP Place 1 drop into both eyes daily as needed (dry eyes).   calcium carbonate (OSCAL) 1500 (600 Ca) MG TABS tablet Take 1,500 mg by mouth daily.    calcium carbonate (TUMS - DOSED IN MG ELEMENTAL CALCIUM) 500 MG chewable tablet Chew 1 tablet by mouth daily as needed for indigestion or heartburn.   carvedilol (COREG) 12.5 MG tablet TAKE 1 TABLET (12.5 MG  TOTAL) BY MOUTH 2 (TWO) TIMES DAILY WITH A MEAL.   Cholecalciferol 1000 units capsule Take 1,000 Units by mouth daily.   diphenhydramine-acetaminophen (TYLENOL PM) 25-500 MG TABS tablet Take 1 tablet by mouth at bedtime as needed (sleep).    fexofenadine (ALLEGRA) 180 MG tablet Take 180 mg by mouth at bedtime.    furosemide (LASIX) 20 MG tablet TAKE 1 TABLET (20 MG TOTAL) BY MOUTH EVERY OTHER DAY.   methimazole (TAPAZOLE) 5 MG tablet Take 1 tablet (5 mg total) by mouth daily.   pantoprazole (PROTONIX) 40 MG tablet Take 40 mg by mouth daily.   rosuvastatin (CRESTOR) 10 MG tablet TAKE 1 TABLET BY MOUTH EVERY DAY   SUMAtriptan (IMITREX) 100 MG tablet TAKE 1 TABLET BY MOUTH FOR 1 DOSE. MAY REPEAT IN 2 HOURS IF HEADACHE PERSISTS OR RECURS   triamcinolone (NASACORT) 55 MCG/ACT AERO nasal inhaler Place 1 spray into the nose daily as needed (allergies).   zonisamide (ZONEGRAN) 100 MG capsule TAKE 1 CAPSULE BY MOUTH EVERY DAY   No facility-administered encounter medications on file as of 10/10/2021.     Lab Results  Component Value Date   WBC 7.4 10/10/2021   HGB 12.6 10/10/2021   HCT 39.4 10/10/2021   PLT 200.0 10/10/2021   GLUCOSE 97 10/10/2021   CHOL 148 10/10/2021   TRIG 87.0 10/10/2021   HDL 56.90 10/10/2021   LDLCALC 74 10/10/2021   ALT 10 10/10/2021   AST 16 10/10/2021   NA 143 10/10/2021   K 4.4 10/10/2021   CL 109 10/10/2021   CREATININE 0.89 10/10/2021   BUN 18 10/10/2021  CO2 31 10/10/2021   TSH 2.03 06/07/2021   INR 0.98 11/26/2015   HGBA1C 6.1 10/10/2021    MR LUMBAR SPINE WO CONTRAST  Result Date: 08/10/2021 CLINICAL DATA:  Low back pain radiating into the left hip for several weeks. Patient slipped and fell 5 months ago. No previous relevant surgery. EXAM: MRI LUMBAR SPINE WITHOUT CONTRAST TECHNIQUE: Multiplanar, multisequence MR imaging of the lumbar spine was performed. No intravenous contrast was administered. COMPARISON:  Lumbar spine radiographs 05/11/2020,  thoracic spine radiographs 10/23/2014 and abdominopelvic CT 11/14/2015. FINDINGS: Segmentation: In correlation with prior imaging, there are small ribs at T12 and 5 lumbar type vertebral bodies. Alignment:  Mild convex left scoliosis.  Normal lateral alignment. Vertebrae: Interval development of a mild superior endplate compression fracture at T11. This appears healed without residual marrow hyperintensity on inversion recovery imaging. No evidence of acute fracture or pars defect. The visualized sacroiliac joints appear unremarkable. Conus medullaris: Extends to the L1 level and appears normal. Paraspinal and other soft tissues: No significant paraspinal findings. Renal parapelvic cysts are present bilaterally. Disc levels: No significant disc space findings from T11-12 through L1-2. L2-3: Chronic loss of disc height with annular disc bulging and endplate osteophytes asymmetric to the right. Mild facet and ligamentous hypertrophy. Stable mild right lateral recess and right foraminal narrowing without nerve root encroachment. L3-4: Chronic loss of disc height with annular disc bulging and a small disc protrusion in the right subarticular zone. Mild to moderate facet and ligamentous hypertrophy bilaterally. Resulting mild spinal stenosis with asymmetric narrowing of the right lateral recess and possible right L4 nerve root encroachment. There is also mild left greater than right foraminal narrowing. L4-5: Preserved disc height. Mild disc bulging, facet and ligamentous hypertrophy. Mild asymmetric left lateral recess and left foraminal narrowing without definite nerve root encroachment. L5-S1: Preserved disc height. Mild disc bulging and facet hypertrophy. No spinal stenosis or nerve root encroachment. IMPRESSION: 1. No acute findings or explanation for the patient's symptoms identified. 2. Chronic spondylosis at L2-3 and L3-4, similar to previous CT. No high-grade spinal stenosis or clear explanation for left  radicular symptoms identified. There is mild left foraminal narrowing at L3-4. 3. Healed mild superior endplate compression deformity at T11. No acute osseous findings. Electronically Signed   By: Richardean Sale M.D.   On: 08/10/2021 14:01       Assessment & Plan:   Problem List Items Addressed This Visit     GERD (gastroesophageal reflux disease)    Upper symptoms controlled on protonix.        History of colonic polyps    Colonoscopy 02/2017.  Recommended f/u in 5 years.       Hypercholesterolemia    On simvastatin.  Low cholesterol diet and exercise.  Follow lipid panel and liver function tests.        Relevant Orders   Basic metabolic panel (Completed)   Lipid panel (Completed)   Hepatic function panel (Completed)   CBC with Differential/Platelet (Completed)   Hyperglycemia    Low carb diet and exercise.  Follow met b and a1c.       Relevant Orders   Hemoglobin A1c (Completed)   Hyperthyroidism    Has been followed by endocrinology.  On tapazole.       Incomplete emptying of bladder    Reports problems with increased frequency and nocturia.  Concerned not emptying bladder fully.  Request referral to urology for further evaluation.        Relevant Orders  Ambulatory referral to Urology   Chronic systolic heart failure (HCC) (Chronic)    Continue spironolactone, coreg and lasix.  No evidence of volume overload.  Follow.       SVT (supraventricular tachycardia) (HCC) (Chronic)    On coreg.  No increased heart rate or palpitations.  Follow.       Other Visit Diagnoses     Visit for screening mammogram       Relevant Orders   MM 3D SCREEN BREAST BILATERAL        Einar Pheasant, MD

## 2021-10-11 LAB — HEPATIC FUNCTION PANEL
ALT: 10 U/L (ref 0–35)
AST: 16 U/L (ref 0–37)
Albumin: 4 g/dL (ref 3.5–5.2)
Alkaline Phosphatase: 107 U/L (ref 39–117)
Bilirubin, Direct: 0.1 mg/dL (ref 0.0–0.3)
Total Bilirubin: 0.4 mg/dL (ref 0.2–1.2)
Total Protein: 6.1 g/dL (ref 6.0–8.3)

## 2021-10-11 LAB — LIPID PANEL
Cholesterol: 148 mg/dL (ref 0–200)
HDL: 56.9 mg/dL (ref >39.00)
LDL Cholesterol: 74 mg/dL (ref 0–99)
NonHDL: 91.26
Total CHOL/HDL Ratio: 3
Triglycerides: 87 mg/dL (ref 0.0–149.0)
VLDL: 17.4 mg/dL (ref 0.0–40.0)

## 2021-10-11 LAB — BASIC METABOLIC PANEL
BUN: 18 mg/dL (ref 6–23)
CO2: 31 mEq/L (ref 19–32)
Calcium: 9.1 mg/dL (ref 8.4–10.5)
Chloride: 109 mEq/L (ref 96–112)
Creatinine, Ser: 0.89 mg/dL (ref 0.40–1.20)
GFR: 66.43 mL/min (ref >60.00)
Glucose, Bld: 97 mg/dL (ref 70–99)
Potassium: 4.4 mEq/L (ref 3.5–5.1)
Sodium: 143 mEq/L (ref 135–145)

## 2021-10-15 ENCOUNTER — Encounter: Payer: Self-pay | Admitting: Internal Medicine

## 2021-10-15 NOTE — Assessment & Plan Note (Signed)
On coreg.  No increased heart rate or palpitations.  Follow.  

## 2021-10-15 NOTE — Assessment & Plan Note (Signed)
Upper symptoms controlled on protonix.  

## 2021-10-15 NOTE — Assessment & Plan Note (Signed)
Continue spironolactone, coreg and lasix.  No evidence of volume overload.  Follow.

## 2021-10-15 NOTE — Assessment & Plan Note (Signed)
Reports problems with increased frequency and nocturia.  Concerned not emptying bladder fully.  Request referral to urology for further evaluation.

## 2021-10-15 NOTE — Assessment & Plan Note (Signed)
Colonoscopy 02/2017.  Recommended f/u in 5 years.

## 2021-10-15 NOTE — Assessment & Plan Note (Signed)
Has been followed by endocrinology.  On tapazole.

## 2021-10-15 NOTE — Assessment & Plan Note (Signed)
On simvastatin.  Low cholesterol diet and exercise.  Follow lipid panel and liver function tests.   

## 2021-10-15 NOTE — Assessment & Plan Note (Signed)
Low carb diet and exercise.  Follow met b and a1c.

## 2021-10-19 ENCOUNTER — Other Ambulatory Visit: Payer: Self-pay | Admitting: Internal Medicine

## 2021-10-25 ENCOUNTER — Ambulatory Visit: Payer: Medicare Other

## 2021-11-14 ENCOUNTER — Ambulatory Visit (INDEPENDENT_AMBULATORY_CARE_PROVIDER_SITE_OTHER): Payer: Medicare Other

## 2021-11-14 VITALS — Ht 61.0 in | Wt 161.0 lb

## 2021-11-14 DIAGNOSIS — Z0000 Encounter for general adult medical examination without abnormal findings: Secondary | ICD-10-CM

## 2021-11-14 NOTE — Patient Instructions (Addendum)
?  Kristi Greer , ?Thank you for taking time to come for your Medicare Wellness Visit. I appreciate your ongoing commitment to your health goals. Please review the following plan we discussed and let me know if I can assist you in the future.  ? ?These are the goals we discussed: ? Goals   ? ?  ? Patient Stated  ?   DIET - REDUCE PORTION SIZE (pt-stated)   ?   Increase physical activity (pt-stated)   ? ?  ?  ?This is a list of the screening recommended for you and due dates:  ?Health Maintenance  ?Topic Date Due  ? Tetanus Vaccine  11/15/2022*  ? Mammogram  11/15/2021  ? Zoster (Shingles) Vaccine (2 of 2) 12/06/2021  ? Colon Cancer Screening  03/20/2027  ? Pneumonia Vaccine  Completed  ? Flu Shot  Completed  ? DEXA scan (bone density measurement)  Completed  ? Hepatitis C Screening: USPSTF Recommendation to screen - Ages 26-79 yo.  Completed  ? HPV Vaccine  Aged Out  ? COVID-19 Vaccine  Discontinued  ?*Topic was postponed. The date shown is not the original due date.  ?  ?

## 2021-11-14 NOTE — Progress Notes (Signed)
Subjective:   Kristi Greer is a 71 y.o. female who presents for Medicare Annual (Subsequent) preventive examination.  Review of Systems    No ROS.  Medicare Wellness Virtual Visit.  Visual/audio telehealth visit, UTA vital signs.   See social history for additional risk factors.   Cardiac Risk Factors include: advanced age (>23men, >28 women)     Objective:    Today's Vitals   11/14/21 1053  Weight: 161 lb (73 kg)  Height: 5\' 1"  (1.549 m)   Body mass index is 30.42 kg/m.  Advanced Directives 11/14/2021 10/22/2020 06/17/2019 06/09/2019 04/02/2019 12/31/2017 03/19/2017  Does Patient Have a Medical Advance Directive? Yes Yes Yes Yes Yes Yes No  Type of Estate agent of [place];Living will Healthcare Power of [place];Living will Healthcare Power of [place];Living will Living will;Healthcare Power of [employer] Power of Attorney Living will;Healthcare Power of [employer] Power of [place];Living will  Does patient want to make changes to medical advance directive? No - Patient declined No - Patient declined No - Patient declined - No - Patient declined No - Patient declined -  Copy of Healthcare Power of Attorney in Chart? No - copy requested No - copy requested No - copy requested - No - copy requested No - copy requested No - copy requested  Would patient like information on creating a medical advance directive? - - - - - - -    Current Medications (verified) Outpatient Encounter Medications as of 11/14/2021  Medication Sig   acetaminophen (TYLENOL) 500 MG tablet Take 500-1,000 mg by mouth every 6 (six) hours as needed for moderate pain or headache.   calcium carbonate (OSCAL) 1500 (600 Ca) MG TABS tablet Take 1,500 mg by mouth daily.    calcium carbonate (TUMS - DOSED IN MG ELEMENTAL CALCIUM) 500 MG chewable tablet Chew 1 tablet by mouth daily as needed for indigestion or heartburn.   carvedilol (COREG) 12.5 MG tablet TAKE 1 TABLET (12.5  MG TOTAL) BY MOUTH 2 (TWO) TIMES DAILY WITH A MEAL.   Cholecalciferol 1000 units capsule Take 1,000 Units by mouth daily.   diphenhydramine-acetaminophen (TYLENOL PM) 25-500 MG TABS tablet Take 1 tablet by mouth at bedtime as needed (sleep).    fexofenadine (ALLEGRA) 180 MG tablet Take 180 mg by mouth at bedtime.    furosemide (LASIX) 20 MG tablet TAKE 1 TABLET (20 MG TOTAL) BY MOUTH EVERY OTHER DAY.   methimazole (TAPAZOLE) 5 MG tablet Take 1 tablet (5 mg total) by mouth daily.   pantoprazole (PROTONIX) 40 MG tablet Take 40 mg by mouth daily.   rosuvastatin (CRESTOR) 10 MG tablet TAKE 1 TABLET BY MOUTH EVERY DAY   SUMAtriptan (IMITREX) 100 MG tablet TAKE 1 TABLET BY MOUTH FOR 1 DOSE. MAY REPEAT IN 2 HOURS IF HEADACHE PERSISTS OR RECURS   triamcinolone (NASACORT) 55 MCG/ACT AERO nasal inhaler Place 1 spray into the nose daily as needed (allergies).   zonisamide (ZONEGRAN) 100 MG capsule TAKE 1 CAPSULE BY MOUTH EVERY DAY   [DISCONTINUED] ARTIFICIAL TEAR SOLUTION OP Place 1 drop into both eyes daily as needed (dry eyes).   No facility-administered encounter medications on file as of 11/14/2021.    Allergies (verified) Patient has no known allergies.   History: Past Medical History:  Diagnosis Date   Allergy    Atrial fibrillation (HCC)    Cancer (HCC)    basal cell   Cardiomyopathy, nonischemic (HCC)    Carpal tunnel syndrome    bilateral  Congestive heart failure (HCC)    normalization of EF with medical therapy   Dysrhythmia 08/31/2015   long RP tachycardia   Environmental and seasonal allergies    Female cystocele    Hyperlipidemia    Hypertension    Hyperthyroidism    Migraines    Overactive detrusor    Personal history of colonic polyps    PONV (postoperative nausea and vomiting)    SUI (stress urinary incontinence, female)    Uterovaginal prolapse    UTI (urinary tract infection)    Vaginal atrophy    Past Surgical History:  Procedure Laterality Date   ANTERIOR  AND POSTERIOR VAGINAL REPAIR W/ SACROSPINOUS LIGAMENT SUSPENSION  10/18/2012   CARDIAC CATHETERIZATION N/A 11/26/2015   Procedure: Right/Left Heart Cath and Coronary Angiography;  Surgeon: Dolores Patty, MD;  [facility] INVASIVE CV LAB;  Service: Cardiovascular;  Laterality: N/A;   CHOLECYSTECTOMY  2006   COLONOSCOPY WITH PROPOFOL N/A 03/19/2017   Procedure: COLONOSCOPY WITH PROPOFOL;  Surgeon: Christena Deem, MD;  Location: [facility] ENDOSCOPY;  Service: Endoscopy;  Laterality: N/A;   COLPOPEXY  10/18/2012   CYSTOURETHROSCOPY  10/18/2012   ESOPHAGOGASTRODUODENOSCOPY (EGD) WITH PROPOFOL N/A 10/03/2016   Procedure: ESOPHAGOGASTRODUODENOSCOPY (EGD) WITH PROPOFOL;  Surgeon: Christena Deem, MD;  Location: [facility] ENDOSCOPY;  Service: Endoscopy;  Laterality: N/A;   EYE SURGERY     cataract extration   FINGER SURGERY     HYSTEROSCOPY  2011, 10/2010   OOPHORECTOMY     REVERSE SHOULDER ARTHROPLASTY Right 06/17/2019   Procedure: REVERSE SHOULDER ARTHROPLASTY;  Surgeon: Christena Flake, MD;  Location: [facility] ORS;  Service: Orthopedics;  Laterality: Right;   TONSILLECTOMY     TUBAL LIGATION     VAGINAL HYSTERECTOMY     midurethral sling   Family History  Problem Relation Age of Onset   Hypertension Mother    Stroke Mother    Diabetes Mother    Hypercholesterolemia Father    Heart disease Father        MI   Hypertension Father    Stroke Father    Breast cancer Neg Hx    Colon cancer Neg Hx    Thyroid disease Neg Hx    Social History   Socioeconomic History   Marital status: Widowed    Spouse name: Not on file   Number of children: 2   Years of education: Not on file   Highest education level: Not on file  Occupational History   Not on file  Tobacco Use   Smoking status: Never   Smokeless tobacco: Never  Vaping Use   Vaping Use: Never used  Substance and Sexual Activity   Alcohol use: Yes    Alcohol/week: 0.0 standard drinks    Comment: Socially   Drug use: No   Sexual  activity: Not on file  Other Topics Concern   Not on file  Social History Narrative   Not on file   Social Determinants of Health   Financial Resource Strain: Low Risk    Difficulty of Paying Living Expenses: Not hard at all  Food Insecurity: No Food Insecurity   Worried About Programme researcher, broadcasting/film/video in the Last Year: Never true   Ran Out of Food in the Last Year: Never true  Transportation Needs: No Transportation Needs   Lack of Transportation (Medical): No   Lack of Transportation (Non-Medical): No  Physical Activity: Not on file  Stress: No Stress Concern Present   Feeling of Stress : Not  at all  Social Connections: Moderately Integrated   Frequency of Communication with Friends and Family: More than three times a week   Frequency of Social Gatherings with Friends and Family: More than three times a week   Attends Religious Services: More than 4 times per year   Active Member of [employer] or Organizations: Yes   Attends Banker Meetings: Not on file   Marital Status: Widowed    Tobacco Counseling Counseling given: Not Answered   Clinical Intake:  Pre-visit preparation completed: Yes        Diabetes: No  How often do you need to have someone help you when you read instructions, pamphlets, or other written materials from your doctor or pharmacy?: 1 - Never Interpreter Needed?: No    Activities of Daily Living In your present state of health, do you have any difficulty performing the following activities: 11/14/2021  Hearing? N  Vision? N  Difficulty concentrating or making decisions? N  Walking or climbing stairs? N  Dressing or bathing? N  Doing errands, shopping? N  Preparing Food and eating ? N  Using the Toilet? N  In the past six months, have you accidently leaked urine? N  Do you have problems with loss of bowel control? N  Managing your Medications? N  Managing your Finances? N  Housekeeping or managing your Housekeeping? N  Some recent data  might be hidden    Patient Care Team: Dale Flathead, MD as PCP - General (Internal Medicine) Antonieta Iba, MD as PCP - Cardiology (Cardiology) Delma Freeze, FNP as Nurse Practitioner (Family Medicine) Marcina Millard, MD as Consulting Physician (Cardiology) Abisogun, Albin Felling, MD as Consulting Physician (Endocrinology)  Indicate any recent Medical Services you may have received from other than [facility] providers in the past year (date may be approximate).     Assessment:   This is a routine wellness examination for Arlean.  Virtual Visit via Telephone Note  I connected with  RAKYIA MULLENIX on 11/14/21 at 10:45 AM EDT by telephone and verified that I am speaking with the correct person using two identifiers.  Persons participating in the virtual visit: patient/Nurse Health Advisor   I discussed the limitations of performing evaluation and management service by telehealth. The patient expressed understanding and agreed to proceed. We continued and completed visit with audio only.  Some vital signs may be absent or patient reported.   Hearing/Vision screen Hearing Screening - Comments:: Patient is able to hear conversational tones without difficulty. No issues reported. Vision Screening - Comments:: Cataract extraction, bilateral  Dietary issues and exercise activities discussed:   Healthy diet  Good water intake    Goals Addressed               This Visit's Progress     Patient Stated     Increase physical activity (pt-stated)         Depression Screen PHQ 2/9 Scores 11/14/2021 10/22/2020 05/11/2020 11/13/2019 11/06/2019 04/02/2019 12/12/2017  PHQ - 2 Score 0 0 0 0 6 0 0    Fall Risk Fall Risk  11/14/2021 10/22/2020 05/11/2020 11/13/2019 11/06/2019  Falls in the past year? - 0 0 0 0  Number falls in past yr: - 0 0 0 0  Injury with Fall? - 0 0 0 0  Follow up Falls evaluation completed Falls evaluation completed Falls evaluation completed - -   FALL  RISK PREVENTION PERTAINING TO THE HOME: Home free of loose throw rugs in walkways,  pet beds, electrical cords, etc? Yes  Adequate lighting in your home to reduce risk of falls? Yes   ASSISTIVE DEVICES UTILIZED TO PREVENT FALLS: Life alert? No  Use of a cane, walker or w/c? No   TIMED UP AND GO: Was the test performed? No .   Cognitive Function:  Patient is alert and oriented x3.    6CIT Screen 04/02/2019  What Year? 0 points  What month? 0 points  What time? 0 points  Count back from 20 0 points  Months in reverse 0 points  Repeat phrase 0 points  Total Score 0    Immunizations Immunization History  Administered Date(s) Administered   Fluad Quad(high Dose 65+) 04/28/2019, 09/29/2020, 06/07/2021   Influenza Split 05/27/2012, 05/26/2013, 05/15/2014   Influenza, High Dose Seasonal PF 05/01/2018   Influenza-Unspecified 05/26/2015   Pneumococcal Conjugate-13 05/13/2019   Pneumococcal Polysaccharide-23 01/28/2021   Zoster Recombinat (Shingrix) 10/11/2021   TDAP status: Due, Education has been provided regarding the importance of this vaccine. Advised may receive this vaccine at local pharmacy or Health Dept. Aware to provide a copy of the vaccination record if obtained from local pharmacy or Health Dept. Verbalized acceptance and understanding. Deferred.   Shingles vaccine- first dose completed. Second dose scheduled 12/10/21.   Screening Tests Health Maintenance  Topic Date Due   TETANUS/TDAP  11/15/2022 (Originally 12/17/1969)   MAMMOGRAM  11/15/2021   Zoster Vaccines- Shingrix (2 of 2) 12/06/2021   COLONOSCOPY (Pts 45-102yrs Insurance coverage will need to be confirmed)  03/20/2027   Pneumonia Vaccine 14+ Years old  Completed   INFLUENZA VACCINE  Completed   DEXA SCAN  Completed   Hepatitis C Screening  Completed   HPV VACCINES  Aged Out   COVID-19 Vaccine  Discontinued   Health Maintenance There are no preventive care reminders to display for this patient.  Mammogram-  scheduled 11/21/21.  Lung Cancer Screening: (Low Dose CT Chest recommended if Age 57-80 years, 30 pack-year currently smoking OR have quit w/in 15years.) does not qualify.   Vision Screening: Recommended annual ophthalmology exams for early detection of glaucoma and other disorders of the eye.  Dental Screening: Recommended annual dental exams for proper oral hygiene  Community Resource Referral / Chronic Care Management: CRR required this visit?  No   CCM required this visit?  No      Plan:   Keep all routine maintenance appointments.   I have personally reviewed and noted the following in the patient's chart:   Medical and social history Use of alcohol, tobacco or illicit drugs  Current medications and supplements including opioid prescriptions.  Functional ability and status Nutritional status Physical activity Advanced directives List of other physicians Hospitalizations, surgeries, and ER visits in previous 12 months Vitals Screenings to include cognitive, depression, and falls Referrals and appointments  In addition, I have reviewed and discussed with patient certain preventive protocols, quality metrics, and best practice recommendations. A written personalized care plan for preventive services as well as general preventive health recommendations were provided to patient via mychart.     Ashok Pall, LPN   1/61/0960

## 2021-11-21 ENCOUNTER — Other Ambulatory Visit: Payer: Self-pay

## 2021-11-21 ENCOUNTER — Ambulatory Visit (INDEPENDENT_AMBULATORY_CARE_PROVIDER_SITE_OTHER): Payer: Medicare Other | Admitting: Urology

## 2021-11-21 ENCOUNTER — Ambulatory Visit: Payer: Medicare Other

## 2021-11-21 ENCOUNTER — Ambulatory Visit
Admission: RE | Admit: 2021-11-21 | Discharge: 2021-11-21 | Disposition: A | Discharge status: Home / Self Care | Payer: Medicare Other | Source: Ambulatory Visit | Attending: Internal Medicine | Admitting: Internal Medicine

## 2021-11-21 ENCOUNTER — Encounter: Payer: Self-pay | Admitting: Urology

## 2021-11-21 VITALS — BP 166/66 | HR 73 | Ht 61.0 in | Wt 161.0 lb

## 2021-11-21 DIAGNOSIS — N8111 Cystocele, midline: Secondary | ICD-10-CM | POA: Diagnosis not present

## 2021-11-21 DIAGNOSIS — Z1231 Encounter for screening mammogram for malignant neoplasm of breast: Secondary | ICD-10-CM | POA: Diagnosis not present

## 2021-11-21 DIAGNOSIS — R339 Retention of urine, unspecified: Secondary | ICD-10-CM

## 2021-11-21 LAB — MICROSCOPIC EXAMINATION: WBC, UA: >30 /hpf — AB (ref 0–5)

## 2021-11-21 LAB — URINALYSIS, COMPLETE
Bilirubin, UA: NEGATIVE
Glucose, UA: NEGATIVE
Ketones, UA: NEGATIVE
Nitrite, UA: POSITIVE — AB
Specific Gravity, UA: >1.03 — ABNORMAL HIGH (ref 1.005–1.030)
Urobilinogen, Ur: 0.2 mg/dL (ref 0.2–1.0)
pH, UA: 6 (ref 5.0–7.5)

## 2021-11-21 LAB — BLADDER SCAN AMB NON-IMAGING: Scan Result: 0

## 2021-11-21 NOTE — Progress Notes (Signed)
? ?11/21/2021 ?9:24 AM  ? ?Kristi Greer ?08/30/1950 ?[id number] ? ?Referring provider: Einar Pheasant, MD ?[address] ?[address] ?[place],  [place] [address] ? ?Chief Complaint  ?Patient presents with  ? incomplete bladder emptying  ? Urinary Retention  ? ? ?HPI: ?I was consulted to assess the patient's prolapse.  She feels vaginal bulging.  Sometimes she reduces it.  Sometimes is uncomfortable after standing and then with sitting.  No splinting maneuvers.  She describes a hysterectomy and bladder suspension possibly 7 or 8 years ago done transvaginally ?  ?Sometimes leaks with coughing sneezing but not bending lifting.  Primary symptom is urge incontinence.  No bedwetting.  Wears 2 or 3 pads a day in severity depends on degree of urgency.  Sometimes the pads are soaked. ?  ?She is time voiding every 1 hour but cannot hold it for 2 hours.  She gets up 4-5 times at night.  Takes Lasix.  No ankle edema. ?  ?Flow is slower.  Sometimes it stops and starts.  She feels empty ?  ?She gets 2 bladder infections that respond favorably to antibiotics per year.  She has suprapubic pressure low back pain and increased frequency.   ? ? On pelvic examination patient has small grade 3 cystocele with central defect that reached the introitus.  Vaginal cuff descended from approximately 8 cm to 5 cm.  No rectocele.  Well supported bladder neck.  No stress incontinence with cystocele reduced.  No mesh extrusion.  Vaginal length mildly decreased ? ?Patient has mixed incontinence.  She primarily has urge incontinence.  She has a frequent bladder.   ?  ?Picture drawn.  If patient ever had surgery she would likely best benefit from a transvaginal vault suspension with cystocele repair and graft.  Role of urodynamics and cystoscopy discussed. ?  ?Patient and her husband would like to avoid surgery.  I briefly reviewed it with her.  She would like her urge incontinence treated with Myrbetriq 50 mg samples and prescription  and see a gynecologist for pessary fitting and will send a referral.  She would like to be sexually active but I think she can be taught how to change it herself.  We will proceed accordingly.  She understands in most cases of prolapse is not related to the overactive bladder and urgency as her dominating complaint.  It appears she had a lot of discomfort after her last operation hopes not to have another ? ?Today ?Patient was seen in June 2021. ?Patient is more uncomfortable with her prolapse.  She can be standing and then when she sits it is uncomfortable.  She did not like the pessary.  She reports the Myrbetriq failed ? ?Currently she has urge incontinence wearing 1-4 pads a day moderately wet to soaked.  It depends on fluid intake.  She denies stress incontinence and bedwetting. ? ?She can void every hour and cannot hold it for 2 hours.  She gets up 4-5 times a night.  She supposed to take Lasix every second day but is noncompliant.  No ankle edema ? ?Flow was slow.  Sometimes it sprays.  She feels empty.  Sometimes she double voids a reasonable amount ? ?She has had a hysterectomy ? ?She used to get bladder infections.  No neurologic issues.  Bowel movements normal ? ?On pelvic examination patient had a grade 3 cystocele with central defect.  Her vaginal length decreased from 8 or 9 cm to approximately 4 cm.  With the prolapse  reduced she had no rectocele or stress incontinence. ? ? ?PMH: ?Past Medical History:  ?Diagnosis Date  ? Allergy   ? Atrial fibrillation ([place])   ? Cancer [facility])   ? basal cell  ? Cardiomyopathy, nonischemic ([place])   ? Carpal tunnel syndrome   ? bilateral  ? Congestive heart failure ([place])   ? normalization of EF with medical therapy  ? Dysrhythmia 08/31/2015  ? long RP tachycardia  ? Environmental and seasonal allergies   ? Female cystocele   ? Hyperlipidemia   ? Hypertension   ? Hyperthyroidism   ? Migraines   ? Overactive detrusor   ? Personal history of colonic polyps   ? PONV  (postoperative nausea and vomiting)   ? SUI (stress urinary incontinence, female)   ? Uterovaginal prolapse   ? UTI (urinary tract infection)   ? Vaginal atrophy   ? ? ?Surgical History: ?Past Surgical History:  ?Procedure Laterality Date  ? ANTERIOR AND POSTERIOR VAGINAL REPAIR W/ SACROSPINOUS LIGAMENT SUSPENSION  10/18/2012  ? CARDIAC CATHETERIZATION N/A 11/26/2015  ? Procedure: Right/Left Heart Cath and Coronary Angiography;  Surgeon: Jolaine Artist, MD;  Location: [place] CV LAB;  Service: Cardiovascular;  Laterality: N/A;  ? CHOLECYSTECTOMY  2006  ? COLONOSCOPY WITH PROPOFOL N/A 03/19/2017  ? Procedure: COLONOSCOPY WITH PROPOFOL;  Surgeon: Lollie Sails, MD;  Location: [facility] ENDOSCOPY;  Service: Endoscopy;  Laterality: N/A;  ? COLPOPEXY  10/18/2012  ? CYSTOURETHROSCOPY  10/18/2012  ? ESOPHAGOGASTRODUODENOSCOPY (EGD) WITH PROPOFOL N/A 10/03/2016  ? Procedure: ESOPHAGOGASTRODUODENOSCOPY (EGD) WITH PROPOFOL;  Surgeon: Lollie Sails, MD;  Location: [facility] ENDOSCOPY;  Service: Endoscopy;  Laterality: N/A;  ? EYE SURGERY    ? cataract extration  ? FINGER SURGERY    ? HYSTEROSCOPY  2011, 10/2010  ? OOPHORECTOMY    ? REVERSE SHOULDER ARTHROPLASTY Right 06/17/2019  ? Procedure: REVERSE SHOULDER ARTHROPLASTY;  Surgeon: Corky Mull, MD;  Location: [facility] ORS;  Service: Orthopedics;  Laterality: Right;  ? TONSILLECTOMY    ? TUBAL LIGATION    ? VAGINAL HYSTERECTOMY    ? midurethral sling  ? ? ?Home Medications:  ?Allergies as of 11/21/2021   ?No Known Allergies ?  ? ?  ?Medication List  ?  ? ?  ? Accurate as of November 21, 2021  9:24 AM. If you have any questions, ask your nurse or doctor.  ?  ?  ? ?  ? ?acetaminophen 500 MG tablet ?Commonly known as: TYLENOL ?Take 500-1,000 mg by mouth every 6 (six) hours as needed for moderate pain or headache. ?  ?calcium carbonate 1500 (600 Ca) MG Tabs tablet ?Commonly known as: OSCAL ?Take 1,500 mg by mouth daily. ?  ?calcium carbonate 500 MG chewable tablet ?Commonly known as:  TUMS - dosed in mg elemental calcium ?Chew 1 tablet by mouth daily as needed for indigestion or heartburn. ?  ?carvedilol 12.5 MG tablet ?Commonly known as: COREG ?TAKE 1 TABLET (12.5 MG TOTAL) BY MOUTH 2 (TWO) TIMES DAILY WITH A MEAL. ?  ?Cholecalciferol 25 MCG (1000 UT) capsule ?Take 1,000 Units by mouth daily. ?  ?diphenhydramine-acetaminophen 25-500 MG Tabs tablet ?Commonly known as: TYLENOL PM ?Take 1 tablet by mouth at bedtime as needed (sleep). ?  ?fexofenadine 180 MG tablet ?Commonly known as: ALLEGRA ?Take 180 mg by mouth at bedtime. ?  ?furosemide 20 MG tablet ?Commonly known as: LASIX ?TAKE 1 TABLET (20 MG TOTAL) BY MOUTH EVERY OTHER DAY. ?  ?methimazole 5 MG tablet ?Commonly known as:  TAPAZOLE ?Take 1 tablet (5 mg total) by mouth daily. ?  ?pantoprazole 40 MG tablet ?Commonly known as: PROTONIX ?Take 40 mg by mouth daily. ?  ?rosuvastatin 10 MG tablet ?Commonly known as: CRESTOR ?TAKE 1 TABLET BY MOUTH EVERY DAY ?  ?SUMAtriptan 100 MG tablet ?Commonly known as: IMITREX ?TAKE 1 TABLET BY MOUTH FOR 1 DOSE. MAY REPEAT IN 2 HOURS IF HEADACHE PERSISTS OR RECURS ?  ?triamcinolone 55 MCG/ACT Aero nasal inhaler ?Commonly known as: NASACORT ?Place 1 spray into the nose daily as needed (allergies). ?  ?zonisamide 100 MG capsule ?Commonly known as: ZONEGRAN ?TAKE 1 CAPSULE BY MOUTH EVERY DAY ?  ? ?  ? ? ?Allergies: No Known Allergies ? ?Family History: ?Family History  ?Problem Relation Age of Onset  ? Hypertension Mother   ? Stroke Mother   ? Diabetes Mother   ? Hypercholesterolemia Father   ? Heart disease Father   ?     MI  ? Hypertension Father   ? Stroke Father   ? Breast cancer Neg Hx   ? Colon cancer Neg Hx   ? Thyroid disease Neg Hx   ? ? ?Social History:  reports that she has never smoked. She has never used smokeless tobacco. She reports current alcohol use. She reports that she does not use drugs. ? ?ROS: ?  ? ?  ? ?  ? ?  ? ?  ? ?  ? ?  ? ?  ? ?  ? ?  ? ?  ? ?  ? ?  ? ?Physical Exam: ?LMP 10/18/2012    ?Constitutional:  Alert and oriented, No acute distress. ? ? ?Laboratory Data: ?Lab Results  ?Component Value Date  ? WBC 7.4 10/10/2021  ? HGB 12.6 10/10/2021  ? HCT 39.4 10/10/2021  ? MCV 83.9 10/10/2021  ? PLT

## 2021-11-25 ENCOUNTER — Telehealth: Payer: Self-pay

## 2021-11-25 MED ORDER — NITROFURANTOIN MONOHYD MACRO 100 MG PO CAPS: 100.0000 mg | ORAL_CAPSULE | Freq: Two times a day (BID) | ORAL | 0 refills | Status: AC

## 2021-11-25 NOTE — Telephone Encounter (Signed)
Pt aware. Sent to pharmacy. ?

## 2021-11-25 NOTE — Telephone Encounter (Signed)
-----   Message from Bjorn Loser, MD sent at 11/25/2021 12:12 PM EDT ----- ?MACRODANTIN 100 MG BID FOR 7 DAYS ?----- Message ----- ?From: Alvera Novel, CMA ?Sent: 11/24/2021   3:40 PM EDT ?To: Bjorn Loser, MD ? ? ?----- Message ----- ?From: Interface, [facility] Lab Results In ?Sent: 11/21/2021   4:36 PM EDT ?To: Rowe Robert Clinical ? ? ? ?

## 2021-11-26 LAB — CULTURE, URINE COMPREHENSIVE

## 2021-12-15 ENCOUNTER — Other Ambulatory Visit: Payer: Self-pay | Admitting: Internal Medicine

## 2021-12-27 DIAGNOSIS — C44629 Squamous cell carcinoma of skin of left upper limb, including shoulder: Secondary | ICD-10-CM | POA: Diagnosis not present

## 2021-12-27 DIAGNOSIS — D485 Neoplasm of uncertain behavior of skin: Secondary | ICD-10-CM | POA: Diagnosis not present

## 2021-12-27 DIAGNOSIS — L570 Actinic keratosis: Secondary | ICD-10-CM | POA: Diagnosis not present

## 2021-12-30 DIAGNOSIS — N3941 Urge incontinence: Secondary | ICD-10-CM | POA: Diagnosis not present

## 2022-01-02 ENCOUNTER — Ambulatory Visit (INDEPENDENT_AMBULATORY_CARE_PROVIDER_SITE_OTHER): Payer: Medicare Other | Admitting: Urology

## 2022-01-02 ENCOUNTER — Other Ambulatory Visit: Payer: Self-pay | Admitting: Urology

## 2022-01-02 DIAGNOSIS — N8111 Cystocele, midline: Secondary | ICD-10-CM | POA: Diagnosis not present

## 2022-01-02 DIAGNOSIS — N3946 Mixed incontinence: Secondary | ICD-10-CM | POA: Diagnosis not present

## 2022-01-02 NOTE — Progress Notes (Signed)
? ?01/02/2022 ?9:53 AM  ? ?Kristi Greer ?04/07/1951 ?[id number] ? ?Referring provider: Einar Pheasant, MD ?[address] ?[address] ?[place],  [place] [id number] ? ?Chief Complaint  ?Patient presents with  ? Results  ? ? ?HPI: ?I was consulted to assess the patient's prolapse.  She feels vaginal bulging.  Sometimes she reduces it.  Sometimes is uncomfortable after standing and then with sitting.  No splinting maneuvers.  She describes a hysterectomy and bladder suspension possibly 7 or 8 years ago done transvaginally ?  ?Sometimes leaks with coughing sneezing but not bending lifting.  Primary symptom is urge incontinence.  No bedwetting.  Wears 2 or 3 pads a day in severity depends on degree of urgency.  Sometimes the pads are soaked. ?  ?She is time voiding every 1 hour but cannot hold it for 2 hours.  She gets up 4-5 times at night.  Takes Lasix.  No ankle edema. ?  ?Flow is slower.  Sometimes it stops and starts.  She feels empty ?  ?She gets 2 bladder infections that respond favorably to antibiotics per year.  She has suprapubic pressure low back pain and increased frequency.   ?  ? On pelvic examination patient has small grade 3 cystocele with central defect that reached the introitus.  Vaginal cuff descended from approximately 8 cm to 5 cm.  No rectocele.  Well supported bladder neck.  No stress incontinence with cystocele reduced.  No mesh extrusion.  Vaginal length mildly decreased ?  ?Patient has mixed incontinence.  She primarily has urge incontinence.  She has a frequent bladder.   ?  ?Patient and her husband would like to avoid surgery.  I briefly reviewed it with her.  She would like her urge incontinence treated with Myrbetriq 50 mg samples and prescription and see a gynecologist for pessary fitting and will send a referral.  She would like to be sexually active but I think she can be taught how to change it herself.  We will proceed accordingly.  She understands in most cases of prolapse  is not related to the overactive bladder and urgency as her dominating complaint.  It appears she had a lot of discomfort after her last operation hopes not to have another ?  ?Patient was seen in June 2021. ?Patient is more uncomfortable with her prolapse.  She can be standing and then when she sits it is uncomfortable.  She did not like the pessary.  She reports the Myrbetriq failed ? ?Currently she has urge incontinence wearing 1-4 pads a day moderately wet to soaked.  It depends on fluid intake.  She denies stress incontinence and bedwetting. ? ?She can void every hour and cannot hold it for 2 hours.  She gets up 4-5 times a night.  She supposed to take Lasix every second day but is noncompliant.  No ankle edema ? ?Flow was slow.  Sometimes it sprays.  She feels empty.  Sometimes she double voids a reasonable amount ?  ?On pelvic examination patient had a grade 3 cystocele with central defect.  Her vaginal length decreased from 8 or 9 cm to approximately 4 cm.  With the prolapse reduced she had no rectocele or stress incontinence.   ? ?Picture drawn.  Role of urodynamics discussed.  She understands this could be repaired vaginally but that a robotic procedure has now become an excellent alternative.  It would also maximize vaginal length.  She understands that the prolapse surgery is not for her urgency  incontinence ? ?Today ?Frequency stable.  Incontinence stable ?On urodynamics patient did not void and was catheterized for a few milliliters.  Maximum bladder capacity was 140 mL.  She had increased bladder sensation.  Her bladder was unstable in the overactivity was provoked by Valsalva maneuvers and removing the vaginal packing.  An unstable bladder contraction reached a pressure of 77 cm of water.  She felt urgency but did not leak.  No stress incontinence with Valsalva pressure of 63 cm of water.  During voluntary voiding she voided 135 mL with a maximal flow of 8 mils per second.  Max voiding pressure 54 cm  water.  Residual 55 mL.  EMG activity decreased during voiding.  Bladder neck descent at 1 or 2 cm.  The details of the urodynamics are signed dictated ? ?Picture drawn ?Patient understands the treatment of her urgency incontinence is medical and third line therapies.  She understands that if she has prolapse surgery that it generally persists postoperatively.  We talked about prolapse surgery and especially the robotic procedure.  She understands that likely her urge incontinence will persist and she can have worsening of urgency or stress incontinence afterwards.  In my opinion she should not have a simultaneous sling.  She is sexually active and this repair will also maximize vaginal length.  I will copy this note in [place] and she will see Dr. Louis Meckel ? ? ? ?PMH: ?Past Medical History:  ?Diagnosis Date  ? Allergy   ? Atrial fibrillation ([place])   ? Cancer [facility])   ? basal cell  ? Cardiomyopathy, nonischemic ([place])   ? Carpal tunnel syndrome   ? bilateral  ? Congestive heart failure ([place])   ? normalization of EF with medical therapy  ? Dysrhythmia 08/31/2015  ? long RP tachycardia  ? Environmental and seasonal allergies   ? Female cystocele   ? Hyperlipidemia   ? Hypertension   ? Hyperthyroidism   ? Migraines   ? Overactive detrusor   ? Personal history of colonic polyps   ? PONV (postoperative nausea and vomiting)   ? SUI (stress urinary incontinence, female)   ? Uterovaginal prolapse   ? UTI (urinary tract infection)   ? Vaginal atrophy   ? ? ?Surgical History: ?Past Surgical History:  ?Procedure Laterality Date  ? ANTERIOR AND POSTERIOR VAGINAL REPAIR W/ SACROSPINOUS LIGAMENT SUSPENSION  10/18/2012  ? CARDIAC CATHETERIZATION N/A 11/26/2015  ? Procedure: Right/Left Heart Cath and Coronary Angiography;  Surgeon: Jolaine Artist, MD;  Location: [facility];  Service: Cardiovascular;  Laterality: N/A;  ? CHOLECYSTECTOMY  2006  ? COLONOSCOPY WITH PROPOFOL N/A 03/19/2017  ? Procedure: COLONOSCOPY WITH  PROPOFOL;  Surgeon: Lollie Sails, MD;  Location: [facility] ENDOSCOPY;  Service: Endoscopy;  Laterality: N/A;  ? COLPOPEXY  10/18/2012  ? CYSTOURETHROSCOPY  10/18/2012  ? ESOPHAGOGASTRODUODENOSCOPY (EGD) WITH PROPOFOL N/A 10/03/2016  ? Procedure: ESOPHAGOGASTRODUODENOSCOPY (EGD) WITH PROPOFOL;  Surgeon: Lollie Sails, MD;  Location: [facility] ENDOSCOPY;  Service: Endoscopy;  Laterality: N/A;  ? EYE SURGERY    ? cataract extration  ? FINGER SURGERY    ? HYSTEROSCOPY  2011, 10/2010  ? OOPHORECTOMY    ? REVERSE SHOULDER ARTHROPLASTY Right 06/17/2019  ? Procedure: REVERSE SHOULDER ARTHROPLASTY;  Surgeon: Corky Mull, MD;  Location: [facility] ORS;  Service: Orthopedics;  Laterality: Right;  ? TONSILLECTOMY    ? TUBAL LIGATION    ? VAGINAL HYSTERECTOMY    ? midurethral sling  ? ? ?Home Medications:  ?Allergies as  of 01/02/2022   ?No Known Allergies ?  ? ?  ?Medication List  ?  ? ?  ? Accurate as of Jan 02, 2022  9:53 AM. If you have any questions, ask your nurse or doctor.  ?  ?  ? ?  ? ?acetaminophen 500 MG tablet ?Commonly known as: TYLENOL ?Take 500-1,000 mg by mouth every 6 (six) hours as needed for moderate pain or headache. ?  ?calcium carbonate 1500 (600 Ca) MG Tabs tablet ?Commonly known as: OSCAL ?Take 1,500 mg by mouth daily. ?  ?calcium carbonate 500 MG chewable tablet ?Commonly known as: TUMS - dosed in mg elemental calcium ?Chew 1 tablet by mouth daily as needed for indigestion or heartburn. ?  ?carvedilol 12.5 MG tablet ?Commonly known as: COREG ?TAKE 1 TABLET (12.5 MG TOTAL) BY MOUTH 2 (TWO) TIMES DAILY WITH A MEAL. ?  ?Cholecalciferol 25 MCG (1000 UT) capsule ?Take 1,000 Units by mouth daily. ?  ?fexofenadine 180 MG tablet ?Commonly known as: ALLEGRA ?Take 180 mg by mouth at bedtime. ?  ?furosemide 20 MG tablet ?Commonly known as: LASIX ?TAKE 1 TABLET (20 MG TOTAL) BY MOUTH EVERY OTHER DAY. ?  ?methimazole 5 MG tablet ?Commonly known as: TAPAZOLE ?Take 1 tablet (5 mg total) by mouth daily. ?  ?pantoprazole 40 MG  tablet ?Commonly known as: PROTONIX ?Take 40 mg by mouth daily. ?  ?rosuvastatin 10 MG tablet ?Commonly known as: CRESTOR ?TAKE 1 TABLET BY MOUTH EVERY DAY ?  ?SUMAtriptan 100 MG tablet ?Commonly known as: IM

## 2022-01-03 ENCOUNTER — Ambulatory Visit: Payer: Medicare Other | Admitting: Endocrinology

## 2022-01-09 DIAGNOSIS — N8111 Cystocele, midline: Secondary | ICD-10-CM | POA: Diagnosis not present

## 2022-01-16 ENCOUNTER — Encounter: Payer: Self-pay | Admitting: Internal Medicine

## 2022-01-17 ENCOUNTER — Other Ambulatory Visit (INDEPENDENT_AMBULATORY_CARE_PROVIDER_SITE_OTHER): Payer: Medicare Other

## 2022-01-17 ENCOUNTER — Other Ambulatory Visit: Payer: Self-pay

## 2022-01-17 DIAGNOSIS — R300 Dysuria: Secondary | ICD-10-CM

## 2022-01-17 NOTE — Telephone Encounter (Signed)
Needs an appt for evaluation if back pain.

## 2022-01-17 NOTE — Telephone Encounter (Signed)
Spoke with pt and and scheduled her for an appt.

## 2022-01-18 ENCOUNTER — Other Ambulatory Visit: Payer: Self-pay

## 2022-01-18 ENCOUNTER — Encounter: Payer: Self-pay | Admitting: Family

## 2022-01-18 ENCOUNTER — Other Ambulatory Visit: Payer: Self-pay | Admitting: Family

## 2022-01-18 ENCOUNTER — Ambulatory Visit (INDEPENDENT_AMBULATORY_CARE_PROVIDER_SITE_OTHER): Payer: Medicare Other | Admitting: Family

## 2022-01-18 VITALS — BP 100/60 | HR 71 | Temp 97.9°F | Ht 61.0 in | Wt 158.0 lb

## 2022-01-18 DIAGNOSIS — M5440 Lumbago with sciatica, unspecified side: Secondary | ICD-10-CM

## 2022-01-18 DIAGNOSIS — G8929 Other chronic pain: Secondary | ICD-10-CM | POA: Diagnosis not present

## 2022-01-18 LAB — URINALYSIS, ROUTINE W REFLEX MICROSCOPIC
Bilirubin Urine: NEGATIVE
Ketones, ur: NEGATIVE
Nitrite: NEGATIVE
Specific Gravity, Urine: 1.015 (ref 1.000–1.030)
Total Protein, Urine: NEGATIVE
Urine Glucose: NEGATIVE
Urobilinogen, UA: 0.2 (ref 0.0–1.0)
pH: 7 (ref 5.0–8.0)

## 2022-01-18 LAB — URINE CULTURE
MICRO NUMBER:: 13433736
SPECIMEN QUALITY:: ADEQUATE

## 2022-01-18 MED ORDER — TIZANIDINE HCL 2 MG PO CAPS: 2.0000 mg | ORAL_CAPSULE | Freq: Every day | ORAL | 1 refills | Status: DC

## 2022-01-18 NOTE — Patient Instructions (Signed)
As discussed, let's start by scheduling Tylenol Arthritis which is a '650mg'$  tablet .   You may take 1-2 tablets every 8 hours ( scheduled) with maximum of 6 tablets per day.   For example , you could take two tablets in the morning ( 8am) and then two tablets again at 4pm.   Maximum daily dose of acetaminophen 4 g per day from all sources.  If you are taking another medication which includes acetaminophen (Tylenol) which may be in cough and cold preparations or pain medication such as Percocet, you will need to factor that into your total daily dose to be safe.  Please let me know if any questions  Start tizanidine (muscle relaxant)  Do not drive or operate heavy machinery while on muscle relaxant. Please do not drink alcohol. Only take this medication as needed for acute muscle spasm at bedtime. This medication make you feel drowsy so be very careful.  Stop taking if become too drowsy or somnolent as this puts you at risk for falls. Please contact our office with any questions.   You may also try lidocaine pain patch and continue to apply heat and ice.  Please let me know if any acute concerns.  We will await urine culture

## 2022-01-18 NOTE — Assessment & Plan Note (Addendum)
Acute on chronic back pain.  Known history of spondylosis.  No radicular symptoms at this time.  Avoiding NSAIDs.  Advised to schedule Tylenol arthritis, trial over-the-counter Salonpas pain patch.  Provided tizanidine 2 mg to use as needed at bedtime.  Urinalysis positive RBCs, WBCs.  Previous urinalysis February of this year was positive for RBCs.  clinical presentation is not consistent with acute nephrolithiasis.   we will await urine culture.  Counseled not to take tizanidine with alcohol or driving on muscle relaxant.  She will let me know how she is doing

## 2022-01-18 NOTE — Progress Notes (Signed)
Subjective:    [name], female    DOB: February 01, 1951, 71 y.o.   MRN: [id number]  CC: Kristi Greer is a 71 y.o. female who presents today for an acute visit.    HPI: Complains of low back pain x one week , worse than typical baseline low back pain which has been on going for years.   She thought pain was related to working in yard.  No injury or falls Describes as an ache Hard to bend over and get in and out of car  No numbness, dysuria, urinary frequency, N , fever, flank pain.   She is taking tylenol '650mg'$  qam.  No h/o PUD.  No h/o renal stones. No vaginal bleeding. H/o hysterectomy  MR lumbar 07/2021 showed chronic spondylosis, similar to previous ct.   History of lumbar spondylosis, chf, migraine, atrial fib ( s/p cardioversion) bcc Recent consult with urology for cystocele. HISTORY:  Past Medical History:  Diagnosis Date   Allergy    Atrial fibrillation ([place])    Cancer ([facility])    basal cell   Cardiomyopathy, nonischemic ([facility])    Carpal tunnel syndrome    bilateral   Congestive heart failure ([place])    normalization of EF with medical therapy   Dysrhythmia 08/31/2015   long RP tachycardia   Environmental and seasonal allergies    Female cystocele    Hyperlipidemia    Hypertension    Hyperthyroidism    Migraines    Overactive detrusor    Personal history of colonic polyps    PONV (postoperative nausea and vomiting)    SUI (stress urinary incontinence, female)    Uterovaginal prolapse    UTI (urinary tract infection)    Vaginal atrophy    Past Surgical History:  Procedure Laterality Date   ANTERIOR AND POSTERIOR VAGINAL REPAIR W/ SACROSPINOUS LIGAMENT SUSPENSION  10/18/2012   CARDIAC CATHETERIZATION N/A 11/26/2015   Procedure: Right/Left Heart Cath and Coronary Angiography;  Surgeon: Jolaine Artist, MD;  Location: [facility];  Service: Cardiovascular;  Laterality: N/A;   CHOLECYSTECTOMY  2006   COLONOSCOPY WITH PROPOFOL  N/A 03/19/2017   Procedure: COLONOSCOPY WITH PROPOFOL;  Surgeon: Lollie Sails, MD;  Location: [facility] ENDOSCOPY;  Service: Endoscopy;  Laterality: N/A;   COLPOPEXY  10/18/2012   CYSTOURETHROSCOPY  10/18/2012   ESOPHAGOGASTRODUODENOSCOPY (EGD) WITH PROPOFOL N/A 10/03/2016   Procedure: ESOPHAGOGASTRODUODENOSCOPY (EGD) WITH PROPOFOL;  Surgeon: Lollie Sails, MD;  Location: [facility] ENDOSCOPY;  Service: Endoscopy;  Laterality: N/A;   EYE SURGERY     cataract extration   FINGER SURGERY     HYSTEROSCOPY  2011, 10/2010   OOPHORECTOMY     REVERSE SHOULDER ARTHROPLASTY Right 06/17/2019   Procedure: REVERSE SHOULDER ARTHROPLASTY;  Surgeon: Corky Mull, MD;  Location: [facility] ORS;  Service: Orthopedics;  Laterality: Right;   TONSILLECTOMY     TUBAL LIGATION     VAGINAL HYSTERECTOMY     midurethral sling   Family History  Problem Relation Age of Onset   Hypertension Mother    Stroke Mother    Diabetes Mother    Hypercholesterolemia Father    Heart disease Father        MI   Hypertension Father    Stroke Father    Breast cancer Neg Hx    Colon cancer Neg Hx    Thyroid disease Neg Hx     Allergies: Patient has no known allergies. Current Outpatient Medications on File Prior to Visit  Medication Sig Dispense Refill   acetaminophen (TYLENOL) 500 MG tablet Take 500-1,000 mg by mouth every 6 (six) hours as needed for moderate pain or headache.     calcium carbonate (OSCAL) 1500 (600 Ca) MG TABS tablet Take 1,500 mg by mouth daily.      calcium carbonate (TUMS - DOSED IN MG ELEMENTAL CALCIUM) 500 MG chewable tablet Chew 1 tablet by mouth daily as needed for indigestion or heartburn.     carvedilol (COREG) 12.5 MG tablet TAKE 1 TABLET (12.5 MG TOTAL) BY MOUTH 2 (TWO) TIMES DAILY WITH A MEAL. 180 tablet 2   Cholecalciferol 1000 units capsule Take 1,000 Units by mouth daily.     fexofenadine (ALLEGRA) 180 MG tablet Take 180 mg by mouth at bedtime.      furosemide (LASIX) 20 MG tablet TAKE 1 TABLET  (20 MG TOTAL) BY MOUTH EVERY OTHER DAY. 45 tablet 3   methimazole (TAPAZOLE) 5 MG tablet Take 1 tablet (5 mg total) by mouth daily. 90 tablet 3   pantoprazole (PROTONIX) 40 MG tablet Take 40 mg by mouth daily.     rosuvastatin (CRESTOR) 10 MG tablet TAKE 1 TABLET BY MOUTH EVERY DAY 90 tablet 3   SUMAtriptan (IMITREX) 100 MG tablet TAKE 1 TABLET BY MOUTH FOR 1 DOSE. MAY REPEAT IN 2 HOURS IF HEADACHE PERSISTS OR RECURS 9 tablet 0   triamcinolone (NASACORT) 55 MCG/ACT AERO nasal inhaler Place 1 spray into the nose daily as needed (allergies).     zonisamide (ZONEGRAN) 100 MG capsule TAKE 1 CAPSULE BY MOUTH EVERY DAY 90 capsule 0   No current facility-administered medications on file prior to visit.    Social History   Tobacco Use   Smoking status: Never   Smokeless tobacco: Never  Vaping Use   Vaping Use: Never used  Substance Use Topics   Alcohol use: Yes    Alcohol/week: 0.0 standard drinks    Comment: Socially   Drug use: No    Review of Systems  Constitutional:  Negative for chills and fever.  Respiratory:  Negative for cough.   Cardiovascular:  Negative for chest pain and palpitations.  Gastrointestinal:  Negative for nausea and vomiting.  Genitourinary:  Negative for difficulty urinating and frequency.  Musculoskeletal:  Positive for back pain.  Neurological:  Negative for numbness.     Objective:    BP 100/60 (BP Location: Right Arm, Patient Position: Sitting, Cuff Size: Normal)   Pulse 71   Temp 97.9 F (36.6 C) (Oral)   Ht '5\' 1"'$  (1.549 m)   Wt 158 lb (71.7 kg)   LMP 10/18/2012   SpO2 98%   BMI 29.85 kg/m    Physical Exam Vitals reviewed.  Constitutional:      Appearance: She is well-developed.  Eyes:     Conjunctiva/sclera: Conjunctivae normal.  Cardiovascular:     Rate and Rhythm: Normal rate and regular rhythm.     Pulses: Normal pulses.     Heart sounds: Normal heart sounds.  Pulmonary:     Effort: Pulmonary effort is normal.     Breath sounds:  Normal breath sounds. No wheezing, rhonchi or rales.  Abdominal:     Tenderness: There is no right CVA tenderness or left CVA tenderness.  Musculoskeletal:       Arms:     Lumbar back: No swelling, edema, spasms, tenderness or bony tenderness. Normal range of motion.     Comments: Full range of motion with flexion, tension, lateral side bends.  Diffuse tenderness noted over lumbar spine.  No pain, numbness, tingling elicited with single leg raise bilaterally.   Skin:    General: Skin is warm and dry.  Neurological:     Mental Status: She is alert.     Sensory: No sensory deficit.     Deep Tendon Reflexes:     Reflex Scores:      Patellar reflexes are 2+ on the right side and 2+ on the left side.    Comments: Sensation and strength intact bilateral lower extremities.  Psychiatric:        Speech: Speech normal.        Behavior: Behavior normal.        Thought Content: Thought content normal.       Assessment & Plan:   Problem List Items Addressed This Visit       Other   Chronic back pain - Primary    Acute on chronic back pain.  Known history of spondylosis.  No radicular symptoms at this time.  Avoiding NSAIDs.  Advised to schedule Tylenol arthritis, trial over-the-counter Salonpas pain patch.  Provided tizanidine 2 mg to use as needed at bedtime.  Urinalysis positive RBCs, WBCs.  Previous urinalysis February of this year was positive for RBCs.  clinical presentation is not consistent with acute nephrolithiasis.   we will await urine culture.  Counseled not to take tizanidine with alcohol or driving on muscle relaxant.  She will let me know how she is doing          I am having Kristi Greer maintain her fexofenadine, Cholecalciferol, calcium carbonate, pantoprazole, furosemide, acetaminophen, calcium carbonate, triamcinolone, methimazole, carvedilol, rosuvastatin, SUMAtriptan, and zonisamide.   No orders of the defined types were placed in this  encounter.   Return precautions given.   Risks, benefits, and alternatives of the medications and treatment plan prescribed today were discussed, and patient expressed understanding.   Education regarding symptom management and diagnosis given to patient on AVS.  Continue to follow with Einar Pheasant, MD for routine health maintenance.   Kristi Greer and I agreed with plan.   Mable Paris, FNP

## 2022-01-19 NOTE — Telephone Encounter (Signed)
Rx change request sent to Dr Nicki Reaper for approval

## 2022-01-20 ENCOUNTER — Other Ambulatory Visit: Payer: Self-pay

## 2022-01-20 ENCOUNTER — Ambulatory Visit (INDEPENDENT_AMBULATORY_CARE_PROVIDER_SITE_OTHER): Payer: Medicare Other | Admitting: Internal Medicine

## 2022-01-20 ENCOUNTER — Telehealth: Payer: Self-pay

## 2022-01-20 ENCOUNTER — Encounter: Payer: Self-pay | Admitting: Internal Medicine

## 2022-01-20 VITALS — BP 118/80 | HR 66 | Ht 61.0 in | Wt 159.2 lb

## 2022-01-20 DIAGNOSIS — E0590 Thyrotoxicosis, unspecified without thyrotoxic crisis or storm: Secondary | ICD-10-CM | POA: Diagnosis not present

## 2022-01-20 DIAGNOSIS — N200 Calculus of kidney: Secondary | ICD-10-CM

## 2022-01-20 LAB — T3, FREE: T3, Free: 3 pg/mL (ref 2.3–4.2)

## 2022-01-20 LAB — T4, FREE: Free T4: 0.68 ng/dL (ref 0.60–1.60)

## 2022-01-20 LAB — TSH: TSH: 4.5 u[IU]/mL (ref 0.35–5.50)

## 2022-01-20 NOTE — Telephone Encounter (Signed)
S/w pharmacist at [facility] - pt picked up medication.  No option for PA , medication not covered. Pharmacist placed medication on a discount card for pt so it was affordable for her.

## 2022-01-20 NOTE — Progress Notes (Unsigned)
[name], female   DOB: Oct 24, 1950, 71 y.o.   MRN: [id number]  HPI  Kristi Greer is a 71 y.o.-year-old female, returning for follow-up for thyrotoxicosis.  She previously saw Dr. Loanne Drilling, last visit 6 months ago.  She was diagnosed with thyrotoxicosis in 2015, when she presented with SVT.  She also has a history of atrial fibrillation, nonischemic cardiomyopathy, and CHF.  She was previously on medication for few months, but unclear which medication.  She is currently on methimazole 5 mg daily.  She declined RAI treatment in the past.  I reviewed pt's thyroid tests: Lab Results  Component Value Date   TSH 2.03 06/07/2021   TSH 1.51 01/28/2021   TSH 2.30 05/11/2020   TSH 0.79 09/30/2019   TSH 1.48 04/25/2019   TSH 3.50 12/16/2018   TSH 4.31 09/24/2018   TSH 0.62 07/19/2018   TSH <0.01 (L) 04/30/2018   TSH <0.010 (L) 03/25/2018   TSH 0.01 (L) 12/10/2017   TSH 0.04 (L) 08/10/2017   TSH 0.03 (L) 06/22/2017   TSH 0.55 05/23/2016   TSH 0.46 11/25/2015   TSH 0.180 (L) 09/01/2015   TSH 0.11 (L) 05/21/2015   TSH 0.12 (L) 04/05/2015   TSH 0.09 (L) 08/06/2014   TSH 0.06 (L) 07/07/2014   TSH 0.069 (L) 07/03/2014   TSH 0.17 (L) 04/24/2014   TSH 0.02 (L) 12/03/2013   TSH 0.19 (L) 10/20/2013   TSH 0.14 (L) 09/17/2013   Lab Results  Component Value Date   FREET4 0.62 06/07/2021   FREET4 0.60 01/28/2021   FREET4 0.56 (L) 09/30/2019   FREET4 0.76 04/25/2019   FREET4 0.72 09/24/2018   FREET4 0.50 (L) 07/19/2018   FREET4 0.82 04/30/2018   FREET4 0.88 03/25/2018   FREET4 0.77 12/10/2017   FREET4 0.76 08/10/2017   FREET4 0.88 06/22/2017   FREET4 0.91 11/25/2015   FREET4 0.71 05/21/2015   FREET4 0.64 04/05/2015   FREET4 0.68 08/06/2014   FREET4 0.79 07/07/2014   FREET4 0.73 04/24/2014   FREET4 0.91 12/03/2013   FREET4 0.68 10/20/2013   FREET4 0.71 09/17/2013   Lab Results  Component Value Date   T3FREE 3.4 09/24/2018   T3FREE 2.9 07/19/2018    T3FREE 4.3 (H) 04/30/2018   T3FREE 3.8 12/10/2017   T3FREE 3.7 08/10/2017   T3FREE 3.9 06/22/2017   T3FREE 3.4 11/25/2015   T3FREE 3.5 09/02/2015   T3FREE 3.7 05/21/2015   T3FREE 3.1 04/05/2015   T3FREE 3.7 08/06/2014   T3FREE 3.3 07/07/2014   T3FREE 3.0 04/24/2014   T3FREE 3.2 12/03/2013   T3FREE 3.0 10/20/2013   T3FREE 3.5 09/17/2013   Antithyroid antibodies: No results found for: TSI  Pt denies: - feeling nodules in neck - hoarseness - dysphagia - choking - SOB with lying down  She mentions: - fatigue - lower back pain  Denies: - excessive sweating/heat intolerance - tremors - anxiety - palpitations - hyperdefecation - weight loss  Pt does not have a FH of thyroid ds. No FH of thyroid cancer.  No recent contrast studies. No steroid use. No herbal supplements. No Biotin use.  Pt. also has a history of HTN, HL, migraines, prediabetes, cystocele, dysuria.  ROS: + see HPI  Past Medical History:  Diagnosis Date   Allergy    Atrial fibrillation ([place])    Cancer ([place])    basal cell   Cardiomyopathy, nonischemic (HCC)    Carpal tunnel syndrome    bilateral   Congestive heart failure ([place])  normalization of EF with medical therapy   Dysrhythmia 08/31/2015   long RP tachycardia   Environmental and seasonal allergies    Female cystocele    Hyperlipidemia    Hypertension    Hyperthyroidism    Migraines    Overactive detrusor    Personal history of colonic polyps    PONV (postoperative nausea and vomiting)    SUI (stress urinary incontinence, female)    Uterovaginal prolapse    UTI (urinary tract infection)    Vaginal atrophy    Past Surgical History:  Procedure Laterality Date   ANTERIOR AND POSTERIOR VAGINAL REPAIR W/ SACROSPINOUS LIGAMENT SUSPENSION  10/18/2012   CARDIAC CATHETERIZATION N/A 11/26/2015   Procedure: Right/Left Heart Cath and Coronary Angiography;  Surgeon: Jolaine Artist, MD;  Location: [place] [facility];  Service:  Cardiovascular;  Laterality: N/A;   CHOLECYSTECTOMY  2006   COLONOSCOPY WITH PROPOFOL N/A 03/19/2017   Procedure: COLONOSCOPY WITH PROPOFOL;  Surgeon: Lollie Sails, MD;  [facility] ENDOSCOPY;  Service: Endoscopy;  Laterality: N/A;   COLPOPEXY  10/18/2012   CYSTOURETHROSCOPY  10/18/2012   ESOPHAGOGASTRODUODENOSCOPY (EGD) WITH PROPOFOL N/A 10/03/2016   Procedure: ESOPHAGOGASTRODUODENOSCOPY (EGD) WITH PROPOFOL;  Surgeon: Lollie Sails, MD;  Location: [facility] ENDOSCOPY;  Service: Endoscopy;  Laterality: N/A;   EYE SURGERY     cataract extration   FINGER SURGERY     HYSTEROSCOPY  2011, 10/2010   OOPHORECTOMY     REVERSE SHOULDER ARTHROPLASTY Right 06/17/2019   Procedure: REVERSE SHOULDER ARTHROPLASTY;  Surgeon: Corky Mull, MD;  Location: [facility] ORS;  Service: Orthopedics;  Laterality: Right;   TONSILLECTOMY     TUBAL LIGATION     VAGINAL HYSTERECTOMY     midurethral sling   Social History   Socioeconomic History   Marital status: Widowed    Spouse name: Not on file   Number of children: 2   Years of education: Not on file   Highest education level: Not on file  Occupational History   Not on file  Tobacco Use   Smoking status: Never   Smokeless tobacco: Never  Vaping Use   Vaping Use: Never used  Substance and Sexual Activity   Alcohol use: Yes    Alcohol/week: 0.0 standard drinks    Comment: Socially   Drug use: No   Sexual activity: Not on file  Other Topics Concern   Not on file  Social History Narrative   Not on file   Social Determinants of Health   Financial Resource Strain: Low Risk    Difficulty of Paying Living Expenses: Not hard at all  Food Insecurity: No Food Insecurity   Worried About Charity fundraiser in the Last Year: Never true   [place] in the Last Year: Never true  Transportation Needs: No Transportation Needs   Lack of Transportation (Medical): No   Lack of Transportation (Non-Medical): No  Physical Activity: Not on file  Stress:  No Stress Concern Present   Feeling of Stress : Not at all  Social Connections: Moderately Integrated   Frequency of Communication with Friends and Family: More than three times a week   Frequency of Social Gatherings with Friends and Family: More than three times a week   Attends Religious Services: More than 4 times per year   Active Member of [employer] or Organizations: Yes   Attends Archivist Meetings: Not on file   Marital Status: Widowed  Intimate Partner Violence: Not At Risk  Fear of Current or Ex-Partner: No   Emotionally Abused: No   Physically Abused: No   Sexually Abused: No   Current Outpatient Medications on File Prior to Visit  Medication Sig Dispense Refill   acetaminophen (TYLENOL) 500 MG tablet Take 500-1,000 mg by mouth every 6 (six) hours as needed for moderate pain or headache.     calcium carbonate (OSCAL) 1500 (600 Ca) MG TABS tablet Take 1,500 mg by mouth daily.      calcium carbonate (TUMS - DOSED IN MG ELEMENTAL CALCIUM) 500 MG chewable tablet Chew 1 tablet by mouth daily as needed for indigestion or heartburn.     carvedilol (COREG) 12.5 MG tablet TAKE 1 TABLET (12.5 MG TOTAL) BY MOUTH 2 (TWO) TIMES DAILY WITH A MEAL. 180 tablet 2   Cholecalciferol 1000 units capsule Take 1,000 Units by mouth daily.     fexofenadine (ALLEGRA) 180 MG tablet Take 180 mg by mouth at bedtime.      furosemide (LASIX) 20 MG tablet TAKE 1 TABLET (20 MG TOTAL) BY MOUTH EVERY OTHER DAY. 45 tablet 3   methimazole (TAPAZOLE) 5 MG tablet Take 1 tablet (5 mg total) by mouth daily. 90 tablet 3   pantoprazole (PROTONIX) 40 MG tablet Take 40 mg by mouth daily.     rosuvastatin (CRESTOR) 10 MG tablet TAKE 1 TABLET BY MOUTH EVERY DAY 90 tablet 3   SUMAtriptan (IMITREX) 100 MG tablet TAKE 1 TABLET BY MOUTH FOR 1 DOSE. MAY REPEAT IN 2 HOURS IF HEADACHE PERSISTS OR RECURS 9 tablet 0   tizanidine (ZANAFLEX) 2 MG capsule Take 1 capsule (2 mg total) by mouth at bedtime. 30 capsule 1    triamcinolone (NASACORT) 55 MCG/ACT AERO nasal inhaler Place 1 spray into the nose daily as needed (allergies).     zonisamide (ZONEGRAN) 100 MG capsule TAKE 1 CAPSULE BY MOUTH EVERY DAY 90 capsule 0   No current facility-administered medications on file prior to visit.   No Known Allergies Family History  Problem Relation Age of Onset   Hypertension Mother    Stroke Mother    Diabetes Mother    Hypercholesterolemia Father    Heart disease Father        MI   Hypertension Father    Stroke Father    Breast cancer Neg Hx    Colon cancer Neg Hx    Thyroid disease Neg Hx    PE: BP 118/80 (BP Location: Left Arm, Patient Position: Sitting, Cuff Size: Normal)   Pulse 66   Ht '5\' 1"'$  (1.549 m)   Wt 159 lb 3.2 oz (72.2 kg)   LMP 10/18/2012   SpO2 98%   BMI 30.08 kg/m  Wt Readings from Last 3 Encounters:  01/20/22 159 lb 3.2 oz (72.2 kg)  01/18/22 158 lb (71.7 kg)  11/21/21 161 lb (73 kg)   Constitutional: normal weight, in NAD Eyes: PERRLA, EOMI, no exophthalmos, no lid lag, no stare ENT: moist mucous membranes, no thyromegaly, no thyroid bruits, no cervical lymphadenopathy Cardiovascular: RRR, No MRG Respiratory: CTA B Musculoskeletal: no deformities, strength intact in all 4 Skin: moist, warm, no rashes Neurological: no tremor with outstretched hands, DTR normal in all 4  ASSESSMENT: 1. Thyrotoxicosis  PLAN:  1. Patient with a history of thyrotoxicosis, without thyrotoxic sxs: weight loss, heat intolerance, hyperdefecation, palpitations, anxiety.  She is currently on low-dose methimazole: 5 mg daily. -Reviewing her TFTs since 2019: normal - she does not appear to have exogenous causes for the low  TSH.  - We discussed that possible causes of thyrotoxicosis are:  Graves ds   Thyroiditis toxic multinodular goiter/ toxic adenoma (I cannot feel nodules at palpation of her thyroid). - will check the TSH, fT3 and fT4 and also add thyroid stimulating antibodies to screen for  Graves' disease.  - If the tests remain abnormal, we may need an uptake and scan to differentiate between the 3 above possible etiologies  - we discussed about possible modalities of treatment for the above conditions, to include methimazole use, radioactive iodine ablation or (last resort) surgery.  She is on a low-dose of methimazole, which I am hoping can be reduced or even stopped due to stability of her TFTs for the last 4 years. - she is on a beta blocker (carvedilol) per cardiology - she is not tachycardic or tremulous - no signs of Graves' ophthalmopathy: she does not have any double vision, blurry vision, eye pain, chemosis. - RTC in 6 months, but  sooner for repeat labs  - Total time spent for the visit: 30 min, in precharting, obtaining medical information from the chart and from the patient, reviewing Dr. Cordelia Pen last note, reviewing her  previous labs,evaluations, and treatments, reviewing her symptoms, counseling her about her condition (please see the discussed topics above), and developing a plan to further investigate and treat it.  Philemon Kingdom, MD PhD [facility] Endocrinology

## 2022-01-20 NOTE — Telephone Encounter (Signed)
Incoming call on triage line from pt regarding a possible same day appt. Pt states she is having low back pain x 1wk. She visited her PCP a few days ago with a negative urine culture. Pt denies any other urinary symptoms. She states she is seeking a CT scan to determine if she may have stones. She denies having a history of stones. Per Sam, she will be happy to see pt next week with a KUB prior and a cath UA. Advised pt instructions of KUB and confirmed appt.

## 2022-01-20 NOTE — Patient Instructions (Signed)
Please continue Methimazole 5 mg daily.  Please stop at the lab.  You should have an endocrinology follow-up appointment in 6 months. 

## 2022-01-20 NOTE — Telephone Encounter (Signed)
Requested a request to change tizanidine to baclofen.  In reviewing chart, it appears that Joycelyn Schmid saw Ms Kristi Greer and prescribed tizanidine.  Tizanidine is usually a well tolerated muscle relaxer (does not make as sleepy).  Please call pt and confirm how she is doing.  Please check with pt/pharmacy - regarding cost of medication.  May need to do PA for tizanidine with reason given above.

## 2022-01-24 ENCOUNTER — Ambulatory Visit
Admission: RE | Admit: 2022-01-24 | Discharge: 2022-01-24 | Disposition: A | Discharge status: Home / Self Care | Payer: Medicare Other | Attending: Physician Assistant | Admitting: Physician Assistant

## 2022-01-24 ENCOUNTER — Ambulatory Visit
Admission: RE | Admit: 2022-01-24 | Discharge: 2022-01-24 | Disposition: A | Discharge status: Home / Self Care | Payer: Medicare Other | Source: Ambulatory Visit | Attending: Physician Assistant | Admitting: Physician Assistant

## 2022-01-24 DIAGNOSIS — Z9049 Acquired absence of other specified parts of digestive tract: Secondary | ICD-10-CM | POA: Diagnosis not present

## 2022-01-24 DIAGNOSIS — N200 Calculus of kidney: Secondary | ICD-10-CM

## 2022-01-24 DIAGNOSIS — Z87442 Personal history of urinary calculi: Secondary | ICD-10-CM | POA: Diagnosis not present

## 2022-01-25 DIAGNOSIS — D485 Neoplasm of uncertain behavior of skin: Secondary | ICD-10-CM | POA: Diagnosis not present

## 2022-01-25 DIAGNOSIS — Z872 Personal history of diseases of the skin and subcutaneous tissue: Secondary | ICD-10-CM | POA: Diagnosis not present

## 2022-01-25 DIAGNOSIS — L821 Other seborrheic keratosis: Secondary | ICD-10-CM | POA: Diagnosis not present

## 2022-01-25 DIAGNOSIS — L570 Actinic keratosis: Secondary | ICD-10-CM | POA: Diagnosis not present

## 2022-01-26 ENCOUNTER — Encounter: Payer: Self-pay | Admitting: Physician Assistant

## 2022-01-26 ENCOUNTER — Ambulatory Visit (INDEPENDENT_AMBULATORY_CARE_PROVIDER_SITE_OTHER): Payer: Medicare Other | Admitting: Physician Assistant

## 2022-01-26 VITALS — BP 103/65 | HR 74 | Ht 61.0 in | Wt 155.0 lb

## 2022-01-26 DIAGNOSIS — R3129 Other microscopic hematuria: Secondary | ICD-10-CM | POA: Diagnosis not present

## 2022-01-26 DIAGNOSIS — M545 Low back pain, unspecified: Secondary | ICD-10-CM | POA: Diagnosis not present

## 2022-01-26 DIAGNOSIS — R300 Dysuria: Secondary | ICD-10-CM | POA: Diagnosis not present

## 2022-01-26 DIAGNOSIS — Z87440 Personal history of urinary (tract) infections: Secondary | ICD-10-CM | POA: Diagnosis not present

## 2022-01-26 LAB — URINALYSIS, COMPLETE
Bilirubin, UA: NEGATIVE
Glucose, UA: NEGATIVE
Ketones, UA: NEGATIVE
Nitrite, UA: NEGATIVE
Specific Gravity, UA: 1.025 (ref 1.005–1.030)
Urobilinogen, Ur: 0.2 mg/dL (ref 0.2–1.0)
pH, UA: 6.5 (ref 5.0–7.5)

## 2022-01-26 LAB — MICROSCOPIC EXAMINATION: RBC, Urine: >30 /hpf — AB (ref 0–2)

## 2022-01-26 NOTE — Patient Instructions (Signed)
Will call with results

## 2022-01-26 NOTE — Progress Notes (Unsigned)
01/26/2022 10:35 AM   Kristi Greer Kristi Greer [birth year] [id number]  CC: Chief Complaint  Patient presents with   Follow-up   Nephrolithiasis   HPI: Kristi Greer is a 71 y.o. 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   Today she reports an approximate 10-day history of low back pain worsened over baseline.  The pain extends across her low back but is slightly worse on the left side.  She typically has similar symptoms with UTIs.  She saw her PCP on 01/18/2022 for evaluation of this, and UA was notable for 21-50 WBCs/hpf, 3-6 RBCs/hpf, calcium oxalate crystals, and few bacteria.  Her urine culture finalized with significant growth.  No known history of urolithiasis.  She underwent KUB 2 days ago with no visualized urolithiasis.  Most recent abdominal cross-sectional imaging was a CT AP without contrast dated 11/14/2015; no urolithiasis noted at that time.  She is a never smoker, retired from a desk job, but does report secondhand smoke exposure from her father.  In-office catheterized UA today positive for 3+ blood, 1+ protein, and 1+ leukocyte esterase; urine microscopy with 11-30 WBCs/HPF, >30 RBCs/HPF, calcium oxalate crystals, and moderate bacteria.   PMH: Past Medical History:  Diagnosis Date   Allergy    Atrial fibrillation ([place])    Cancer ([place])    basal cell   Cardiomyopathy, nonischemic (HCC)    Carpal tunnel syndrome    bilateral   Congestive heart failure ([place])    normalization of EF with medical therapy   Dysrhythmia 08/31/2015   long RP tachycardia   Environmental and seasonal allergies    Female cystocele    Hyperlipidemia    Hypertension    Hyperthyroidism    Migraines    Overactive detrusor    Personal history of colonic polyps    PONV (postoperative nausea and vomiting)    SUI (stress urinary  incontinence, female)    Uterovaginal prolapse    UTI (urinary tract infection)    Vaginal atrophy     Surgical History: Past Surgical History:  Procedure Laterality Date   ANTERIOR AND POSTERIOR VAGINAL REPAIR W/ SACROSPINOUS LIGAMENT SUSPENSION  10/18/2012   CARDIAC CATHETERIZATION N/A 11/26/2015   Procedure: Right/Left Heart Cath and Coronary Angiography;  Surgeon: Jolaine Artist, MD;  Location: [place] [facility];  Service: Cardiovascular;  Laterality: N/A;   CHOLECYSTECTOMY  2006   COLONOSCOPY WITH PROPOFOL N/A 03/19/2017   Procedure: COLONOSCOPY WITH PROPOFOL;  Surgeon: Lollie Sails, MD;  Location: [facility] ENDOSCOPY;  Service: Endoscopy;  Laterality: N/A;   COLPOPEXY  10/18/2012   CYSTOURETHROSCOPY  10/18/2012   ESOPHAGOGASTRODUODENOSCOPY (EGD) WITH PROPOFOL N/A 10/03/2016   Procedure: ESOPHAGOGASTRODUODENOSCOPY (EGD) WITH PROPOFOL;  Surgeon: Lollie Sails, MD;  Location: [facility] ENDOSCOPY;  Service: Endoscopy;  Laterality: N/A;   EYE SURGERY     cataract extration   FINGER SURGERY     HYSTEROSCOPY  2011, 10/2010   OOPHORECTOMY     REVERSE SHOULDER ARTHROPLASTY Right 06/17/2019   Procedure: REVERSE SHOULDER ARTHROPLASTY;  Surgeon: Corky Mull, MD;  Location: [facility] ORS;  Service: Orthopedics;  Laterality: Right;   TONSILLECTOMY     TUBAL LIGATION     VAGINAL HYSTERECTOMY     midurethral sling    Home Medications:  Allergies as of 01/26/2022   No Known Allergies      Medication List  Accurate as of January 26, 2022 10:35 AM. If you have any questions, ask your nurse or doctor.          acetaminophen 500 MG tablet Commonly known as: TYLENOL Take 500-1,000 mg by mouth every 6 (six) hours as needed for moderate pain or headache.   calcium carbonate 1500 (600 Ca) MG Tabs tablet Commonly known as: OSCAL Take 1,500 mg by mouth daily.   calcium carbonate 500 MG chewable tablet Commonly known as: TUMS - dosed in mg elemental calcium Chew 1 tablet by mouth  daily as needed for indigestion or heartburn.   carvedilol 12.5 MG tablet Commonly known as: COREG TAKE 1 TABLET (12.5 MG TOTAL) BY MOUTH 2 (TWO) TIMES DAILY WITH A MEAL.   Cholecalciferol 25 MCG (1000 UT) capsule Take 1,000 Units by mouth daily.   fexofenadine 180 MG tablet Commonly known as: ALLEGRA Take 180 mg by mouth at bedtime.   furosemide 20 MG tablet Commonly known as: LASIX TAKE 1 TABLET (20 MG TOTAL) BY MOUTH EVERY OTHER DAY.   methimazole 5 MG tablet Commonly known as: TAPAZOLE Take 1 tablet (5 mg total) by mouth daily.   pantoprazole 40 MG tablet Commonly known as: PROTONIX Take 40 mg by mouth daily.   rosuvastatin 10 MG tablet Commonly known as: CRESTOR TAKE 1 TABLET BY MOUTH EVERY DAY   SUMAtriptan 100 MG tablet Commonly known as: IMITREX TAKE 1 TABLET BY MOUTH FOR 1 DOSE. MAY REPEAT IN 2 HOURS IF HEADACHE PERSISTS OR RECURS   tizanidine 2 MG capsule Commonly known as: ZANAFLEX Take 1 capsule (2 mg total) by mouth at bedtime.   triamcinolone 55 MCG/ACT Aero nasal inhaler Commonly known as: NASACORT Place 1 spray into the nose daily as needed (allergies).   zonisamide 100 MG capsule Commonly known as: ZONEGRAN TAKE 1 CAPSULE BY MOUTH EVERY DAY        Allergies:  No Known Allergies  Family History: Family History  Problem Relation Age of Onset   Hypertension Mother    Stroke Mother    Diabetes Mother    Hypercholesterolemia Father    Heart disease Father        MI   Hypertension Father    Stroke Father    Breast cancer Neg Hx    Colon cancer Neg Hx    Thyroid disease Neg Hx     Social History:   reports that she has never smoked. She has never been exposed to tobacco smoke. She has never used smokeless tobacco. She reports current alcohol use. She reports that she does not use drugs.  Physical Exam: BP 103/65   Pulse 74   Ht '5\' 1"'$  (1.549 m)   Wt 155 lb (70.3 kg)   LMP 10/18/2012   BMI 29.29 kg/m   Constitutional:  Alert and  oriented, no acute distress, nontoxic appearing HEENT: [place], AT Cardiovascular: No clubbing, cyanosis, or edema Respiratory: Normal respiratory effort, no increased work of breathing Skin: No rashes, bruises or suspicious lesions Neurologic: Grossly intact, no focal deficits, moving all 4 extremities Psychiatric: Normal mood and affect  Laboratory Data: Results for orders placed or performed in visit on 01/26/22  Mycoplasma / ureaplasma culture   Specimen: Urine   UR  Result Value Ref Range   Ureaplasma urealyticum Comment Negative   Mycoplasma hominis Culture Comment Negative  Microscopic Examination   Urine  Result Value Ref Range   WBC, UA 11-30 (A) 0 - 5 /hpf   RBC >30 (A) 0 -  2 /hpf   Epithelial Cells (non renal) 0-10 0 - 10 /hpf   Crystals Present (A) N/A   Crystal Type Calcium Oxalate N/A   Bacteria, UA Moderate (A) None seen/Few  Urinalysis, Complete  Result Value Ref Range   Specific Gravity, UA 1.025 1.005 - 1.030   pH, UA 6.5 5.0 - 7.5   Color, UA Orange Yellow   Appearance Ur Cloudy (A) Clear   Leukocytes,UA 1+ (A) Negative   Protein,UA 1+ (A) Negative/Trace   Glucose, UA Negative Negative   Ketones, UA Negative Negative   RBC, UA 3+ (A) Negative   Bilirubin, UA Negative Negative   Urobilinogen, Ur 0.2 0.2 - 1.0 mg/dL   Nitrite, UA Negative Negative   Microscopic Examination See below:    Pertinent Imaging: Results for orders placed during the hospital encounter of 01/24/22  Abdomen 1 view (KUB)  Narrative CLINICAL DATA:  Follow-up kidney stone.  EXAM: ABDOMEN - 1 VIEW  COMPARISON:  CT 11/14/2015  FINDINGS: No visualized urolithiasis. Cholecystectomy clips in the right upper quadrant. Normal bowel gas pattern with small to moderate colonic stool burden. The included lung bases are clear. Unremarkable osseous structures.  IMPRESSION: No visualized urolithiasis.   Electronically Signed By: Keith Rake M.D. On: 01/25/2022 12:07  I  personally reviewed the images referenced above and note no radiopaque urinary calculi.  Assessment & Plan:   1. Acute bilateral low back pain without sciatica Catheterized UA today notable for pyuria, microscopic hematuria, calcium oxalate crystals, and bacteriuria similar to prior, though prior culture grew insignificant growth.  Differential includes but is not limited to UTI, acute stone episode, or other inflammatory conditions of the bladder causing hematuria.  We will obtain standard and atypical urine cultures today and treat as indicated.  If positive, she will need repeat UA to prove resolution of hematuria.  She may require cystoscopy depending on this outcome. - Urinalysis, Complete - CULTURE, URINE COMPREHENSIVE - Mycoplasma / ureaplasma culture  2. Other microscopic hematuria Related to the above.  We discussed today that given her broad differential, I recommend proceeding with a CT urogram for further evaluation and she is in agreement.  As above, she may require cystoscopy depending on results. - CT HEMATURIA WORKUP; Future   Return for Will call with results.  Debroah Loop, PA-C  [facility] [address] [place], [place] [address] [phone]

## 2022-01-26 NOTE — Progress Notes (Unsigned)
In and Out Catheterization  Patient is present today for a I & O catheterization due to possible UTI. Patient was cleaned and prepped in a sterile fashion with betadine . A 14FR cath was inserted no complications were noted , 60ml of urine return was noted, urine was yellow in color. A clean urine sample was collected for UA, culture. Bladder was drained  And catheter was removed with out difficulty.    Performed by: Debbe [name], CMA

## 2022-01-27 LAB — THYROID STIMULATING IMMUNOGLOBULIN: TSI: <89 % baseline (ref <140)

## 2022-01-27 MED ORDER — METHIMAZOLE 5 MG PO TABS
2.5000 mg | ORAL_TABLET | Freq: Every day | ORAL | 3 refills | Status: DC
Start: 2022-01-27 — End: 2022-08-02

## 2022-01-29 LAB — CULTURE, URINE COMPREHENSIVE

## 2022-02-01 LAB — MYCOPLASMA / UREAPLASMA CULTURE
Mycoplasma hominis Culture: NEGATIVE
Ureaplasma urealyticum: NEGATIVE

## 2022-02-02 ENCOUNTER — Encounter: Payer: Self-pay | Admitting: Internal Medicine

## 2022-02-02 NOTE — Progress Notes (Signed)
I shared her culture results with her via [place]. Please schedule her for a cystoscopy with Dr. Matilde Sprang as well.

## 2022-02-07 ENCOUNTER — Other Ambulatory Visit: Payer: Self-pay | Admitting: Family

## 2022-02-07 DIAGNOSIS — L988 Other specified disorders of the skin and subcutaneous tissue: Secondary | ICD-10-CM | POA: Diagnosis not present

## 2022-02-07 DIAGNOSIS — C44629 Squamous cell carcinoma of skin of left upper limb, including shoulder: Secondary | ICD-10-CM | POA: Diagnosis not present

## 2022-02-08 ENCOUNTER — Encounter: Payer: Self-pay | Admitting: Internal Medicine

## 2022-02-08 ENCOUNTER — Ambulatory Visit (INDEPENDENT_AMBULATORY_CARE_PROVIDER_SITE_OTHER): Payer: Medicare Other | Admitting: Internal Medicine

## 2022-02-08 VITALS — BP 110/68 | HR 72 | Temp 98.3°F | Resp 16 | Ht 61.0 in | Wt 158.0 lb

## 2022-02-08 DIAGNOSIS — G8929 Other chronic pain: Secondary | ICD-10-CM

## 2022-02-08 DIAGNOSIS — R739 Hyperglycemia, unspecified: Secondary | ICD-10-CM | POA: Diagnosis not present

## 2022-02-08 DIAGNOSIS — I5022 Chronic systolic (congestive) heart failure: Secondary | ICD-10-CM

## 2022-02-08 DIAGNOSIS — F439 Reaction to severe stress, unspecified: Secondary | ICD-10-CM | POA: Diagnosis not present

## 2022-02-08 DIAGNOSIS — E7800 Pure hypercholesterolemia, unspecified: Secondary | ICD-10-CM

## 2022-02-08 DIAGNOSIS — I420 Dilated cardiomyopathy: Secondary | ICD-10-CM | POA: Diagnosis not present

## 2022-02-08 DIAGNOSIS — Z0000 Encounter for general adult medical examination without abnormal findings: Secondary | ICD-10-CM

## 2022-02-08 DIAGNOSIS — M545 Low back pain, unspecified: Secondary | ICD-10-CM | POA: Diagnosis not present

## 2022-02-08 DIAGNOSIS — Z86010 Personal history of colonic polyps: Secondary | ICD-10-CM

## 2022-02-08 DIAGNOSIS — K219 Gastro-esophageal reflux disease without esophagitis: Secondary | ICD-10-CM

## 2022-02-08 DIAGNOSIS — E0590 Thyrotoxicosis, unspecified without thyrotoxic crisis or storm: Secondary | ICD-10-CM | POA: Diagnosis not present

## 2022-02-08 DIAGNOSIS — I471 Supraventricular tachycardia: Secondary | ICD-10-CM

## 2022-02-08 LAB — LIPID PANEL
Cholesterol: 141 mg/dL (ref 0–200)
HDL: 49.9 mg/dL (ref >39.00)
LDL Cholesterol: 66 mg/dL (ref 0–99)
NonHDL: 91.18
Total CHOL/HDL Ratio: 3
Triglycerides: 127 mg/dL (ref 0.0–149.0)
VLDL: 25.4 mg/dL (ref 0.0–40.0)

## 2022-02-08 LAB — BASIC METABOLIC PANEL
BUN: 19 mg/dL (ref 6–23)
CO2: 25 mEq/L (ref 19–32)
Calcium: 9.6 mg/dL (ref 8.4–10.5)
Chloride: 108 mEq/L (ref 96–112)
Creatinine, Ser: 0.79 mg/dL (ref 0.40–1.20)
GFR: 76.47 mL/min (ref >60.00)
Glucose, Bld: 98 mg/dL (ref 70–99)
Potassium: 4.2 mEq/L (ref 3.5–5.1)
Sodium: 141 mEq/L (ref 135–145)

## 2022-02-08 LAB — HEPATIC FUNCTION PANEL
ALT: 12 U/L (ref 0–35)
AST: 17 U/L (ref 0–37)
Albumin: 4.1 g/dL (ref 3.5–5.2)
Alkaline Phosphatase: 99 U/L (ref 39–117)
Bilirubin, Direct: 0.1 mg/dL (ref 0.0–0.3)
Total Bilirubin: 0.5 mg/dL (ref 0.2–1.2)
Total Protein: 6.8 g/dL (ref 6.0–8.3)

## 2022-02-08 LAB — HEMOGLOBIN A1C: Hgb A1c MFr Bld: 6 % (ref 4.6–6.5)

## 2022-02-08 NOTE — Progress Notes (Signed)
Patient ID: MARQUASHA BRUTUS, female   DOB: [birth year], 71 y.o.   MRN: [id number]   Subjective:    Patient ID: [name], female    DOB: 1951/06/15, 71 y.o.   MRN: [id number]   HPI With past history of afib, cardiomyopathy, hypertension and hypercholesterolemia.  She comes in today to follow up on these issues as well as for a complete physical exam.  Saw urology 01/26/22 - low back pain and hematuria. Planning for cystoscopy.  Also had f/u with Dr Cruzita Lederer 01/20/22 - f/u thyrotoxicosis.  On methimazole.  Labs checked and recommended for her to decrease methimazole to 2.32m q day.  Recheck TFTs in 6 weeks.  States she is doing relatively well.  Breathing stable. No chest pain.  No acid reflux or abdominal reported. Bowels stable.  Some persistent low back pain.  Notices if stands for a long period of time.  Also concerned regarding her posture.      Past Medical History:  Diagnosis Date   Allergy    Atrial fibrillation ([facility]    Cancer ([facility]    basal cell   Cardiomyopathy, nonischemic (HCC)    Carpal tunnel syndrome    bilateral   Congestive heart failure ([facility]    normalization of EF with medical therapy   Dysrhythmia 08/31/2015   long RP tachycardia   Environmental and seasonal allergies    Female cystocele    Hyperlipidemia    Hypertension    Hyperthyroidism    Migraines    Overactive detrusor    Personal history of colonic polyps    PONV (postoperative nausea and vomiting)    SUI (stress urinary incontinence, female)    Uterovaginal prolapse    UTI (urinary tract infection)    Vaginal atrophy    Past Surgical History:  Procedure Laterality Date   ANTERIOR AND POSTERIOR VAGINAL REPAIR W/ SACROSPINOUS LIGAMENT SUSPENSION  10/18/2012   CARDIAC CATHETERIZATION N/A 11/26/2015   Procedure: Right/Left Heart Cath and Coronary Angiography;  Surgeon: DJolaine Artist MD;  [facility];  Service: Cardiovascular;  Laterality: N/A;   CHOLECYSTECTOMY  2006    COLONOSCOPY WITH PROPOFOL N/A 03/19/2017   Procedure: COLONOSCOPY WITH PROPOFOL;  Surgeon: SLollie Sails MD;  [facility];  Service: Endoscopy;  Laterality: N/A;   COLPOPEXY  10/18/2012   CYSTOURETHROSCOPY  10/18/2012   ESOPHAGOGASTRODUODENOSCOPY (EGD) WITH PROPOFOL N/A 10/03/2016   Procedure: ESOPHAGOGASTRODUODENOSCOPY (EGD) WITH PROPOFOL;  Surgeon: MLollie Sails MD;  [facility];  Service: Endoscopy;  Laterality: N/A;   EYE SURGERY     cataract extration   FINGER SURGERY     HYSTEROSCOPY  2011, 10/2010   OOPHORECTOMY     REVERSE SHOULDER ARTHROPLASTY Right 06/17/2019   Procedure: REVERSE SHOULDER ARTHROPLASTY;  Surgeon: PCorky Mull MD;  Location: [facility];  Service: Orthopedics;  Laterality: Right;   TONSILLECTOMY     TUBAL LIGATION     VAGINAL HYSTERECTOMY     midurethral sling   Family History  Problem Relation Age of Onset   Hypertension Mother    Stroke Mother    Diabetes Mother    Hypercholesterolemia Father    Heart disease Father        MI   Hypertension Father    Stroke Father    Breast cancer Neg Hx    Colon cancer Neg Hx    Thyroid disease Neg Hx    Social History   Socioeconomic History   Marital status: Widowed  Spouse name: Not on file   Number of children: 2   Years of education: Not on file   Highest education level: Not on file  Occupational History   Not on file  Tobacco Use   Smoking status: Never    Passive exposure: Never   Smokeless tobacco: Never  Vaping Use   Vaping Use: Never used  Substance and Sexual Activity   Alcohol use: Yes    Alcohol/week: 0.0 standard drinks of alcohol    Comment: Socially   Drug use: No   Sexual activity: Not on file  Other Topics Concern   Not on file  Social History Narrative   Not on file   Social Determinants of Health   Financial Resource Strain: Low Risk  (11/14/2021)   Overall Financial Resource Strain (CARDIA)    Difficulty of Paying Living Expenses: Not hard  at all  Food Insecurity: No Food Insecurity (11/14/2021)   Hunger Vital Sign    Worried About Running Out of Food in the Last Year: Never true    Ran Out of Food in the Last Year: Never true  Transportation Needs: No Transportation Needs (11/14/2021)   PRAPARE - Hydrologist (Medical): No    Lack of Transportation (Non-Medical): No  Physical Activity: Not on file  Stress: No Stress Concern Present (11/14/2021)   [place]    Feeling of Stress : Not at all  Social Connections: Moderately Integrated (11/14/2021)   Social Connection and Isolation Panel [NHANES]    Frequency of Communication with Friends and Family: More than three times a week    Frequency of Social Gatherings with Friends and Family: More than three times a week    Attends Religious Services: More than 4 times per year    Active Member of [employer] or Organizations: Yes    Attends Archivist Meetings: Not on file    Marital Status: Widowed     Review of Systems  Constitutional:  Negative for appetite change and unexpected weight change.  HENT:  Negative for congestion and sinus pressure.   Respiratory:  Negative for cough and chest tightness.        Breathing stable.   Cardiovascular:  Negative for chest pain, palpitations and leg swelling.  Gastrointestinal:  Negative for abdominal pain, diarrhea, nausea and vomiting.  Genitourinary:  Positive for hematuria. Negative for difficulty urinating and dysuria.  Musculoskeletal:  Positive for back pain. Negative for joint swelling and myalgias.  Skin:  Negative for color change and rash.  Neurological:  Negative for dizziness, light-headedness and headaches.  Psychiatric/Behavioral:  Negative for agitation and dysphoric mood.        Objective:     BP 110/68 (BP Location: Left Arm, Patient Position: Sitting, Cuff Size: Small)   Pulse 72   Temp 98.3 F (36.8 C)  (Temporal)   Resp 16   Ht _0  (1.549 m)   Wt 158 lb (71.7 kg)   LMP 10/18/2012   SpO2 98%   BMI 29.85 kg/m  Wt Readings from Last 3 Encounters:  02/08/22 158 lb (71.7 kg)  01/26/22 155 lb (70.3 kg)  01/20/22 159 lb 3.2 oz (72.2 kg)    Physical Exam Vitals reviewed.  Constitutional:      General: She is not in acute distress.    Appearance: Normal appearance. She is well-developed.  HENT:     Head: Normocephalic and atraumatic.  Right Ear: External ear normal.     Left Ear: External ear normal.  Eyes:     General: No scleral icterus.       Right eye: No discharge.        Left eye: No discharge.     Conjunctiva/sclera: Conjunctivae normal.  Neck:     Thyroid: No thyromegaly.  Cardiovascular:     Rate and Rhythm: Normal rate and regular rhythm.  Pulmonary:     Effort: No tachypnea, accessory muscle usage or respiratory distress.     Breath sounds: Normal breath sounds. No decreased breath sounds or wheezing.  Chest:  Breasts:    Right: No inverted nipple, mass, nipple discharge or tenderness (no axillary adenopathy).     Left: No inverted nipple, mass, nipple discharge or tenderness (no axilarry adenopathy).  Abdominal:     General: Bowel sounds are normal.     Palpations: Abdomen is soft.     Tenderness: There is no abdominal tenderness.  Musculoskeletal:        General: No swelling or tenderness.     Cervical back: Neck supple.  Lymphadenopathy:     Cervical: No cervical adenopathy.  Skin:    Findings: No erythema or rash.  Neurological:     Mental Status: She is alert and oriented to person, place, and time.  Psychiatric:        Mood and Affect: Mood normal.        Behavior: Behavior normal.      Outpatient Encounter Medications as of 02/08/2022  Medication Sig   acetaminophen (TYLENOL) 500 MG tablet Take 500-1,000 mg by mouth every 6 (six) hours as needed for moderate pain or headache.   calcium carbonate (OSCAL) 1500 (600 Ca) MG TABS tablet Take  1,500 mg by mouth daily.    calcium carbonate (TUMS - DOSED IN MG ELEMENTAL CALCIUM) 500 MG chewable tablet Chew 1 tablet by mouth daily as needed for indigestion or heartburn.   carvedilol (COREG) 12.5 MG tablet TAKE 1 TABLET (12.5 MG TOTAL) BY MOUTH 2 (TWO) TIMES DAILY WITH A MEAL.   Cholecalciferol 1000 units capsule Take 1,000 Units by mouth daily.   fexofenadine (ALLEGRA) 180 MG tablet Take 180 mg by mouth at bedtime.    furosemide (LASIX) 20 MG tablet TAKE 1 TABLET (20 MG TOTAL) BY MOUTH EVERY OTHER DAY.   methimazole (TAPAZOLE) 5 MG tablet Take 0.5 tablets (2.5 mg total) by mouth daily.   pantoprazole (PROTONIX) 40 MG tablet Take 40 mg by mouth daily.   rosuvastatin (CRESTOR) 10 MG tablet TAKE 1 TABLET BY MOUTH EVERY DAY   SUMAtriptan (IMITREX) 100 MG tablet TAKE 1 TABLET BY MOUTH FOR 1 DOSE. MAY REPEAT IN 2 HOURS IF HEADACHE PERSISTS OR RECURS   tizanidine (ZANAFLEX) 2 MG capsule Take 1 capsule (2 mg total) by mouth at bedtime.   triamcinolone (NASACORT) 55 MCG/ACT AERO nasal inhaler Place 1 spray into the nose daily as needed (allergies).   zonisamide (ZONEGRAN) 100 MG capsule TAKE 1 CAPSULE BY MOUTH EVERY DAY   No facility-administered encounter medications on file as of 02/08/2022.     Lab Results  Component Value Date   WBC 7.4 10/10/2021   HGB 12.6 10/10/2021   HCT 39.4 10/10/2021   PLT 200.0 10/10/2021   GLUCOSE 98 02/08/2022   CHOL 141 02/08/2022   TRIG 127.0 02/08/2022   HDL 49.90 02/08/2022   LDLCALC 66 02/08/2022   ALT 12 02/08/2022   AST 17 02/08/2022   NA  141 02/08/2022   K 4.2 02/08/2022   CL 108 02/08/2022   CREATININE 0.79 02/08/2022   BUN 19 02/08/2022   CO2 25 02/08/2022   TSH 4.50 01/20/2022   INR 0.98 11/26/2015   HGBA1C 6.0 02/08/2022    Abdomen 1 view (KUB)  Result Date: 01/25/2022 CLINICAL DATA:  Follow-up kidney stone. EXAM: ABDOMEN - 1 VIEW COMPARISON:  CT 11/14/2015 FINDINGS: No visualized urolithiasis. Cholecystectomy clips in the right  upper quadrant. Normal bowel gas pattern with small to moderate colonic stool burden. The included lung bases are clear. Unremarkable osseous structures. IMPRESSION: No visualized urolithiasis. Electronically Signed   By: Keith Rake M.D.   On: 01/25/2022 12:07       Assessment & Plan:   Problem List Items Addressed This Visit     Chronic back pain    Had issues recently with low back pain.  Known history of spondylosis.  Seeing urology and being worked up for hematuria.  Planning for cystoscopy.  Does noticed some low back pain if stands for long period of time.  This appears to be more msk.  Discussed PT.  Agreeable.  Is also concerned regarding her posture and being "slumped over".  Will have PT address.       Relevant Orders   Ambulatory referral to Physical Therapy   GERD (gastroesophageal reflux disease)    Upper symptoms controlled on protonix.        Health care maintenance    Physical today 02/08/22.  Mammogram 11/21/21 - Birads I.  Colonoscopy 02/2017.  Recommended f/u in 5 years.  Plan bone density - notify when to schedule.       History of colonic polyps    Colonoscopy 02/2017.  Recommended f/u in 5 years. States had colonoscopy 02/21/21.  Obtain results.       Hypercholesterolemia - Primary    On crestor.  Low cholesterol diet and exercise.  Follow lipid panel and liver function tests.        Relevant Orders   Lipid Profile (Completed)   Hepatic function panel (Completed)   Basic Metabolic Panel (BMET) (Completed)   Hyperglycemia    Low carb diet and exercise.  Follow met b and a1c.       Relevant Orders   HgB A1c (Completed)   Hyperthyroidism    Has been followed by endocrinology.  On tapazole. Recently evaluated and dose adjusted - 2.31m q day now.  Follow TFTs.       Stress    Doing well.  Feels good.  Has good family support. Follow.       Chronic systolic heart failure (HCC) (Chronic)    Continue coreg and lasix.  No evidence of volume overload.   Follow.       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HCC) (Chronic)    No evidence of volume overload.  On coreg and lasix.  Follow. Check metabolic panel.       SVT (supraventricular tachycardia) (HCC) (Chronic)    On coreg.  No increased heart rate or palpitations.  Follow.         CEinar Pheasant MD

## 2022-02-08 NOTE — Assessment & Plan Note (Addendum)
Physical today 02/08/22.  Mammogram 11/21/21 - Birads I.  Colonoscopy 02/2017.  Recommended f/u in 5 years.  Plan bone density - notify when to schedule.

## 2022-02-09 ENCOUNTER — Encounter: Payer: Self-pay | Admitting: Internal Medicine

## 2022-02-09 NOTE — Assessment & Plan Note (Signed)
No evidence of volume overload.  On coreg and lasix.  Follow. Check metabolic panel.  

## 2022-02-09 NOTE — Assessment & Plan Note (Signed)
Had issues recently with low back pain.  Known history of spondylosis.  Seeing urology and being worked up for hematuria.  Planning for cystoscopy.  Does noticed some low back pain if stands for long period of time.  This appears to be more msk.  Discussed PT.  Agreeable.  Is also concerned regarding her posture and being "slumped over".  Will have PT address.

## 2022-02-09 NOTE — Assessment & Plan Note (Signed)
Colonoscopy 02/2017.  Recommended f/u in 5 years. States had colonoscopy 02/21/21.  Obtain results.

## 2022-02-09 NOTE — Assessment & Plan Note (Signed)
On crestor.  Low cholesterol diet and exercise.  Follow lipid panel and liver function tests.   

## 2022-02-09 NOTE — Assessment & Plan Note (Signed)
Low carb diet and exercise.  Follow met b and a1c.  

## 2022-02-09 NOTE — Assessment & Plan Note (Signed)
Upper symptoms controlled on protonix.  

## 2022-02-09 NOTE — Assessment & Plan Note (Addendum)
Continue coreg and lasix.  No evidence of volume overload.  Follow.  

## 2022-02-09 NOTE — Assessment & Plan Note (Signed)
Doing well.  Feels good.  Has good family support. Follow.  

## 2022-02-09 NOTE — Assessment & Plan Note (Signed)
Has been followed by endocrinology.  On tapazole. Recently evaluated and dose adjusted - 2.5mg q day now.  Follow TFTs.  

## 2022-02-09 NOTE — Assessment & Plan Note (Signed)
On coreg.  No increased heart rate or palpitations.  Follow.  

## 2022-02-10 ENCOUNTER — Other Ambulatory Visit: Payer: Self-pay | Admitting: Family

## 2022-02-10 ENCOUNTER — Ambulatory Visit
Admission: RE | Admit: 2022-02-10 | Discharge: 2022-02-10 | Disposition: A | Discharge status: Home / Self Care | Payer: Medicare Other | Source: Ambulatory Visit | Attending: Physician Assistant | Admitting: Physician Assistant

## 2022-02-10 DIAGNOSIS — R3129 Other microscopic hematuria: Secondary | ICD-10-CM | POA: Insufficient documentation

## 2022-02-10 DIAGNOSIS — N281 Cyst of kidney, acquired: Secondary | ICD-10-CM | POA: Diagnosis not present

## 2022-02-10 DIAGNOSIS — N210 Calculus in bladder: Secondary | ICD-10-CM | POA: Diagnosis not present

## 2022-02-10 MED ORDER — IOHEXOL 300 MG/ML  SOLN
125.0000 mL | Freq: Once | INTRAMUSCULAR | Status: AC | PRN
  Administered 2022-02-10: 125 mL via INTRAVENOUS

## 2022-02-11 MED ORDER — TIZANIDINE HCL 2 MG PO TABS: 2.0000 mg | ORAL_TABLET | Freq: Every evening | ORAL | 0 refills | Status: DC | PRN

## 2022-02-11 NOTE — Telephone Encounter (Signed)
Rx sent in for zanaflex '2mg'$  tablets instead of capsules.  Pt notified via my chart.

## 2022-02-22 ENCOUNTER — Other Ambulatory Visit: Payer: Self-pay

## 2022-02-22 ENCOUNTER — Telehealth: Payer: Self-pay | Admitting: *Deleted

## 2022-02-22 DIAGNOSIS — E0590 Thyrotoxicosis, unspecified without thyrotoxic crisis or storm: Secondary | ICD-10-CM

## 2022-02-22 NOTE — Addendum Note (Signed)
Addended by: Leeanne Rio on: 02/22/2022 02:43 PM   Modules accepted: Orders

## 2022-02-22 NOTE — Telephone Encounter (Signed)
Please place future orders for lab appt.    Only order found are for: Philemon Kingdom, MD

## 2022-02-22 NOTE — Telephone Encounter (Signed)
No need to reorder. I have added a note to the lab appt to draws labs for Dr. Cruzita Lederer so that she gets the results instead.

## 2022-02-22 NOTE — Telephone Encounter (Signed)
Placed - they are for Dr Cruzita Lederer, pt stated she didn't want to drive all the way to [facility] for labs so she asked dr scott if she can have them drawn here.  Ordered placed in Dr Lars Mage name.

## 2022-03-01 ENCOUNTER — Other Ambulatory Visit: Payer: Self-pay | Admitting: Internal Medicine

## 2022-03-02 ENCOUNTER — Other Ambulatory Visit (INDEPENDENT_AMBULATORY_CARE_PROVIDER_SITE_OTHER): Payer: Medicare Other

## 2022-03-02 DIAGNOSIS — E0590 Thyrotoxicosis, unspecified without thyrotoxic crisis or storm: Secondary | ICD-10-CM | POA: Diagnosis not present

## 2022-03-02 LAB — T4, FREE: Free T4: 0.65 ng/dL (ref 0.60–1.60)

## 2022-03-02 LAB — TSH: TSH: 2.27 u[IU]/mL (ref 0.35–5.50)

## 2022-03-02 LAB — T3, FREE: T3, Free: 2.8 pg/mL (ref 2.3–4.2)

## 2022-03-02 NOTE — Telephone Encounter (Signed)
S/w pt - pt did not make this request. Pt is having no issues with affording Crestor, and no issues with taking it.  Pt stated the pharmacist had mentioned to her once before that switching could save her a copay for medication, but she had said that she was fine.  Pt wishes to take whatever medication you think is best for her.

## 2022-03-02 NOTE — Telephone Encounter (Signed)
Have her continue crestor.

## 2022-03-02 NOTE — Telephone Encounter (Signed)
Please call Kristi Greer and confirm she is wanting to change her cholesterol medication.  Let her know that we received a request from the pharmacy (that she was requestining) to change crestor '10mg'$  q day to lipitor '20mg'$  q day.  Is she wanting to change? If not, continue crestor '10mg'$  q day.  If she is requesting to change, ok to change to lipitor '20mg'$  q day.

## 2022-03-06 ENCOUNTER — Other Ambulatory Visit: Payer: Self-pay | Admitting: Family

## 2022-03-13 DIAGNOSIS — Z859 Personal history of malignant neoplasm, unspecified: Secondary | ICD-10-CM | POA: Diagnosis not present

## 2022-03-13 DIAGNOSIS — L570 Actinic keratosis: Secondary | ICD-10-CM | POA: Diagnosis not present

## 2022-03-14 ENCOUNTER — Ambulatory Visit: Payer: Medicare Other | Attending: Internal Medicine | Admitting: Physical Therapy

## 2022-03-14 DIAGNOSIS — M542 Cervicalgia: Secondary | ICD-10-CM

## 2022-03-14 DIAGNOSIS — G8929 Other chronic pain: Secondary | ICD-10-CM | POA: Diagnosis not present

## 2022-03-14 DIAGNOSIS — M5459 Other low back pain: Secondary | ICD-10-CM | POA: Diagnosis not present

## 2022-03-14 DIAGNOSIS — M545 Low back pain, unspecified: Secondary | ICD-10-CM | POA: Diagnosis not present

## 2022-03-14 NOTE — Therapy (Addendum)
OUTPATIENT PHYSICAL THERAPY CERVICAL AND THORACOLUMBAR EVALUATION   Patient Name: Kristi Greer MRN: [id number] DOB:[birth year], 71 y.o., female Today's Date: 03/15/2022   PT End of Session - 03/15/22 1325     Visit Number 1    Number of Visits 16    Date for PT Re-Evaluation 05/10/22    Authorization Type Medicare    Progress Note Due on Visit 10    PT Start Time 1500    PT Stop Time 1545    PT Time Calculation (min) 45 min    Equipment Utilized During Treatment Gait belt    Activity Tolerance Patient tolerated treatment well    Behavior During Therapy WFL for tasks assessed/performed             Past Medical History:  Diagnosis Date   Allergy    Atrial fibrillation ([place])    Cancer ([place])    basal cell   Cardiomyopathy, nonischemic ([place])    Carpal tunnel syndrome    bilateral   Congestive heart failure ([place])    normalization of EF with medical therapy   Dysrhythmia 08/31/2015   long RP tachycardia   Environmental and seasonal allergies    Female cystocele    Hyperlipidemia    Hypertension    Hyperthyroidism    Migraines    Overactive detrusor    Personal history of colonic polyps    PONV (postoperative nausea and vomiting)    SUI (stress urinary incontinence, female)    Uterovaginal prolapse    UTI (urinary tract infection)    Vaginal atrophy    Past Surgical History:  Procedure Laterality Date   ANTERIOR AND POSTERIOR VAGINAL REPAIR W/ SACROSPINOUS LIGAMENT SUSPENSION  10/18/2012   CARDIAC CATHETERIZATION N/A 11/26/2015   Procedure: Right/Left Heart Cath and Coronary Angiography;  Surgeon: Jolaine Artist, MD;  Location: [facility];  Service: Cardiovascular;  Laterality: N/A;   CHOLECYSTECTOMY  2006   COLONOSCOPY WITH PROPOFOL N/A 03/19/2017   Procedure: COLONOSCOPY WITH PROPOFOL;  Surgeon: Lollie Sails, MD;  Location: [facility];  Service: Endoscopy;  Laterality: N/A;   COLPOPEXY  10/18/2012   CYSTOURETHROSCOPY  10/18/2012    ESOPHAGOGASTRODUODENOSCOPY (EGD) WITH PROPOFOL N/A 10/03/2016   Procedure: ESOPHAGOGASTRODUODENOSCOPY (EGD) WITH PROPOFOL;  Surgeon: Lollie Sails, MD;  Location: [facility] ENDOSCOPY;  Service: Endoscopy;  Laterality: N/A;   EYE SURGERY     cataract extration   FINGER SURGERY     HYSTEROSCOPY  2011, 10/2010   OOPHORECTOMY     REVERSE SHOULDER ARTHROPLASTY Right 06/17/2019   Procedure: REVERSE SHOULDER ARTHROPLASTY;  Surgeon: Corky Mull, MD;  Location: [facility];  Service: Orthopedics;  Laterality: Right;   TONSILLECTOMY     TUBAL LIGATION     VAGINAL HYSTERECTOMY     midurethral sling   Patient Active Problem List   Diagnosis Date Noted   Lumbar spondylosis 03/07/2021   Change in bowel movement 01/30/2021   Stress 09/25/2020   Headache 09/25/2020   Chronic back pain 05/11/2020   History of COVID-19 11/13/2019   Cough 11/13/2019   Nausea 11/06/2019   COVID-19 virus infection 10/30/2019   Status post reverse total shoulder replacement, 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   Pain of left heel 05/03/2019   Immunization counseling 05/03/2019   Right shoulder pain 12/21/2018   Leukocytosis 12/21/2018   Hyperglycemia 12/21/2018   Hyperthyroidism 06/21/2018   Pain of right heel 05/04/2018   Snoring 01/06/2018   Impingement syndrome of shoulder region 09/17/2017   GERD (gastroesophageal reflux disease)  06/24/2017   Bilateral carpal tunnel syndrome 11/29/2016   Carpal tunnel syndrome 11/08/2016   Sinusitis 08/13/2016   Epigastric abdominal pain 05/04/2016   Left sided chest pain 12/05/2015   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[facility])    NSVT (nonsustained ventricular tachycardia) ([facility])    Environmental allergies 74/94/4967   Chronic systolic heart failure ([place]) 09/22/2015   Hypotension 09/22/2015   SVT (supraventricular tachycardia) ([place]) 09/01/2015   Chronic migraine without aura without status migrainosus, not intractable 06/24/2015   Health care maintenance 04/13/2015   Urinary tract infection  without hematuria 08/22/2013   History of migraine headaches 11/11/2012   Hypercholesterolemia 11/11/2012   History of colonic polyps 11/11/2012   Migraines 10/02/2012   Overactive detrusor 10/02/2012   SUI (stress urinary incontinence, female) 10/02/2012   Postop check 10/02/2012   Vaginal prolapse 09/23/2012   Incomplete emptying of bladder 09/23/2012    PCP: Dr. Nicki Reaper    REFERRING PROVIDER: Dr. Nicki Reaper   REFERRING DIAG: M54.2 Other low back Pain                                   M54.59 Cervicalgia   Rationale for Evaluation and Treatment Rehabilitation  THERAPY DIAG:  Cervicalgia  Other low back pain  ONSET DATE: 03/14/12  SUBJECTIVE:                                                                                                                                                                                           SUBJECTIVE STATEMENT: Noticed that her posture is increased slumped resulting in increased neck pain and low back pain. She sleeps with a heating pad at night to relieve pain. Patient notes having a few falls last year while she was running from snakes in her yard.  PERTINENT HISTORY:  Per Dr. Bary Leriche note on 02/08/22    HPI With past history of afib, cardiomyopathy, hypertension and hypercholesterolemia.  She comes in today to follow up on these issues as well as for a complete physical exam.  Saw urology 01/26/22 - low back pain and hematuria. Planning for cystoscopy.  Also had f/u with Dr Cruzita Lederer 01/20/22 - f/u thyrotoxicosis.  On methimazole.  Labs checked and recommended for her to decrease methimazole to 2.'5mg'$  q day.  Recheck TFTs in 6 weeks.  States she is doing relatively well.  Breathing stable. No chest pain.  No acid reflux or abdominal reported. Bowels stable.  Some persistent low back pain.  Notices if stands for a long period of time.  Also concerned regarding her posture.    PAIN:  Are you having pain? Yes: NPRS scale: 8/10 Pain location: Left side and  lumbar spinous processes  Pain description: Achy and throbbing and shooting   Aggravating factors: Bending over to pick up objects  Relieving factors: Heat and ice    PRECAUTIONS: None  WEIGHT BEARING RESTRICTIONS No  FALLS:  Has patient fallen in last 6 months? No  LIVING ENVIRONMENT: Lives with: lives alone Lives in: House/apartment Stairs: Yes: External: 5 steps; can reach both  Internal: 15 steps; railing on right  Has following equipment at home: None  OCCUPATION: Retired   PLOF: Independent  PATIENT GOALS Improve posture and low back and neck pain    OBJECTIVE:              VITALS: BP 115/59 HR 73 SpO2  100   DIAGNOSTIC FINDINGS:  CLINICAL DATA:  Low back pain radiating into the left hip for several weeks. Patient slipped and fell 5 months ago. No previous relevant surgery.   EXAM: MRI LUMBAR SPINE WITHOUT CONTRAST   TECHNIQUE: Multiplanar, multisequence MR imaging of the lumbar spine was performed. No intravenous contrast was administered.   COMPARISON:  Lumbar spine radiographs 05/11/2020, thoracic spine radiographs 10/23/2014 and abdominopelvic CT 11/14/2015.   FINDINGS: Segmentation: In correlation with prior imaging, there are small ribs at T12 and 5 lumbar type vertebral bodies.   Alignment:  Mild convex left scoliosis.  Normal lateral alignment.   Vertebrae: Interval development of a mild superior endplate compression fracture at T11. This appears healed without residual marrow hyperintensity on inversion recovery imaging. No evidence of acute fracture or pars defect. The visualized sacroiliac joints appear unremarkable.   Conus medullaris: Extends to the L1 level and appears normal.   Paraspinal and other soft tissues: No significant paraspinal findings. Renal parapelvic cysts are present bilaterally.   Disc levels:   No significant disc space findings from T11-12 through L1-2.   L2-3: Chronic loss of disc height with annular disc bulging  and endplate osteophytes asymmetric to the right. Mild facet and ligamentous hypertrophy. Stable mild right lateral recess and right foraminal narrowing without nerve root encroachment.   L3-4: Chronic loss of disc height with annular disc bulging and a small disc protrusion in the right subarticular zone. Mild to moderate facet and ligamentous hypertrophy bilaterally. Resulting mild spinal stenosis with asymmetric narrowing of the right lateral recess and possible right L4 nerve root encroachment. There is also mild left greater than right foraminal narrowing.   L4-5: Preserved disc height. Mild disc bulging, facet and ligamentous hypertrophy. Mild asymmetric left lateral recess and left foraminal narrowing without definite nerve root encroachment.   L5-S1: Preserved disc height. Mild disc bulging and facet hypertrophy. No spinal stenosis or nerve root encroachment.   IMPRESSION: 1. No acute findings or explanation for the patient's symptoms identified. 2. Chronic spondylosis at L2-3 and L3-4, similar to previous CT. No high-grade spinal stenosis or clear explanation for left radicular symptoms identified. There is mild left foraminal narrowing at L3-4. 3. Healed mild superior endplate compression deformity at T11. No acute osseous findings.     Electronically Signed   By: Richardean Sale M.D.   On: 08/10/2021 14:01  PATIENT SURVEYS:  FOTO 69/100 with target of 73   SCREENING FOR RED FLAGS: Bowel or bladder incontinence: No Spinal tumors: No Cauda equina syndrome: No Compression fracture: No Abdominal aneurysm: No  COGNITION:  Overall cognitive status: Within functional limits for tasks assessed     SENSATION: WFL  MUSCLE LENGTH: Hamstrings:  Right 80 deg; Left 80 deg Thomas test: Bilateral   POSTURE: rounded shoulders, forward head, and increased thoracic kyphosis  PALPATION: Left glute med TTP along with L3-L5 central spinous process   CERVICAL ROM:    Cervical.                                                            AROM   PROM          NORM               flex:    50                                  50                     ext:    45                                  60           AROM R/L      PROM                          NORM     SB:         45/45                                                              45  rot:          70/70                                                          70-90                                 R      L    Normal   - shoulder flx     180 180          180  - shoulder abd    180 180         180  - shoulder ext     60    60             60  - combined mvt      (touch pt)         - ER       Occiput  Occiput  Occiput         - IR            T7        T7            T7 ER @ 90  deg abd   110 110            90 IR @ 90 deg abd    70   70              70    STRENGTH   Strength R/L 5/5 Shoulder flexion (anterior deltoid/pec major/coracobrachialis, axillary n. (C5/6) and musculocutaneous n. (C5-7)) 5/5 Shoulder abduction (deltoid/supraspinatus, axillary/suprascapular n, C5) 4/4 Shoulder external rotation (infraspinatus/teres minor) 4/4 Shoulder internal rotation (subcapularis/lats/pec major) 4/4 Shoulder extension (posterior deltoid, lats, teres major, axillary/thoracodorsal n.) 3+/3+ Shoulder horizontal abduction 3+/3+     Lower Trap   5/5 Elbow flexion (biceps brachii, brachialis, brachioradialis, musculoskeletal n, C5/6) 5/5 Elbow extension (triceps, radial n, C7) 5/5 Wrist Extension (C6/7) 5/5 Wrist Flexion (C6/7) 5/5 Finger adduction (interossei, ulnar n, T1)  Cervical Isometrics: NT   LUMBAR ROM:   Active  A/PROM  eval  Flexion 85%*  Extension 100%*  Right lateral flexion 100%*  Left lateral flexion 100%*  Right rotation 100%*  Left rotation 100*   (Blank rows = not tested)    * = painful   LOWER EXTREMITY ROM:        Active  Right 03/14/2022 Left 03/14/2022  Hip flexion 120  120  Hip extension 30 30  Hip abduction 45 45  Hip adduction 30 30  Hip internal rotation 45 45  Hip external rotation 45 45  Knee flexion 135 135  Knee extension 0 0  Ankle dorsiflexion 20 20  Ankle plantarflexion 50 50  Ankle inversion 35 35  Ankle eversion 15 15   (Blank rows = not tested)     LOWER EXTREMITY MMT:    MMT Right eval Left eval  Hip flexion 5 5  Hip extension 4 4  Hip abduction 4- 4-  Hip adduction 4- 4-  Hip internal rotation 4+ 4+  Hip external rotation 4+ 4+  Knee flexion 4+ 4+  Knee extension 5 5  Ankle dorsiflexion 5 5  Ankle plantarflexion    Ankle inversion    Ankle eversion     (Blank rows = not tested)  LUMBAR SPECIAL TESTS:  FABER test: Negative  FADIR: Negative   FUNCTIONAL TESTS:  None performed   GAIT: Distance walked: 40 ft  Assistive device utilized: None Level of assistance: Complete Independence Comments: Forward head and rounded shoulders, increased forward flexion    TODAY'S TREATMENT  Lower trunk rotations 3 x 10  Butterfly with forward flexion 2 x 60 sec  Seated HS Stretch 2 x 60 sec  Seated Hip ER Stretch 2 x 60 sec  Figure 4 Stretch 2 x 60 sec with strap    PATIENT EDUCATION:  Education details: form and technique for appropriate exercise and explanation about plan of care  Person educated: Patient Education method: Explanation, Demonstration, Verbal cues, and Handouts Education comprehension: verbalized understanding, returned demonstration, and verbal cues required   HOME EXERCISE PROGRAM: Access Code: [email or web address] URL: [email or web address] Date: 03/14/2022 Prepared by: Bradly Chris  Exercises - Supine Lower Trunk Rotation  - 1 x daily - 7 x weekly - 3 sets - 10 reps - Seated Hamstring Stretch  - 1 x daily - 7 x weekly - 1 sets - 3 reps - 60 hold - Seated Hip External Rotation Stretch  - 1 x daily - 7 x weekly - 1 sets - 3 reps - 60 hold - Bound Angle Hands Forward   - 1 x daily - 7 x  weekly - 1 sets -  3 reps - 60 hold  ASSESSMENT:  CLINICAL IMPRESSION: Patient is a 71 y.o. white female who was seen today for physical therapy evaluation and treatment for chronic low back pain and neck pain. She exhibits signs and symptoms that indicate she is best classified in the movement control treatment group with moderate disability, low to moderate pain, and stable symptoms. She exhibits deficits that include decreased hip strength and flexibility and increased low back pain. Pt will continue to benefit skilled PT to improve cervical and lumbar ROM and hip strength and flexibility to bend and lift without experiencing excess pain to garden recreationally and maintain her pool.     OBJECTIVE IMPAIRMENTS Abnormal gait, difficulty walking, decreased ROM, decreased strength, impaired flexibility, and pain.   ACTIVITY LIMITATIONS carrying, lifting, bending, standing, sleeping, stairs, and locomotion level  PARTICIPATION LIMITATIONS: cleaning, laundry, shopping, community activity, and yard work  PERSONAL FACTORS Age, Past/current experiences, Sex, Time since onset of injury/illness/exacerbation, and 1-2 comorbidities: chronic low back pain, afib,  are also affecting patient's functional outcome.   REHAB POTENTIAL: Good  CLINICAL DECISION MAKING: Stable/uncomplicated  EVALUATION COMPLEXITY: Low   GOALS: Goals reviewed with patient? No  SHORT TERM GOALS: Target date: 03/29/2022  Pt will be independent with HEP in order to improve strength and balance in order to decrease fall risk and improve function at home and work. Baseline: NT  Goal status: INITIAL  2.  Patient will be able to achieve 100% lumbar flexion as evidence of improved symptom response to lumbar movement and improved hamstring length.  Baseline: 85% Goal status: INITIAL  3.  Patient will show understanding of posture by demonstrating accurate seated posture mechanics.  Baseline: NT Goal status: INITIAL   LONG  TERM GOALS: Target date: 05/10/2022  Patient will have improved function and activity level as evidenced by an increase in FOTO score by 10 points or more.  Baseline: 69/100 with target score 73 Goal status: INITIAL  2.  Patient will improve cervical extension AROM to 60 as evidence of improved posture and cervical muscle tension.  Baseline: 45 Goal status: INITIAL  3.  Patient will improve hip and parascapular strength to better support the surrounding structures in the spine in order to decrease symptoms.  Baseline: Hip Abd R/L 4-/4- Add R/L 4-/4- Ext R/L 4/4, Mid Trap 3+/3+, Lower Trap 3+/3+, Shoulder Ext 3+/3+ Goal status: INITIAL    PLAN: PT FREQUENCY: 1-2x/week  PT DURATION: 8 weeks  PLANNED INTERVENTIONS: Therapeutic exercises, Therapeutic activity, Neuromuscular re-education, Balance training, Gait training, Patient/Family education, Self Care, Joint mobilization, Joint manipulation, Aquatic Therapy, Dry Needling, Electrical stimulation, Spinal manipulation, Spinal mobilization, Cryotherapy, Moist heat, Traction, Manual therapy, and Re-evaluation.  PLAN FOR NEXT SESSION: Modified Thomas Stretch, Cervical PROM, Progress cervical and lumbar strengthening and stretching exercises   Bradly Chris PT, DPT  03/15/2022, 2:49 PM

## 2022-03-16 ENCOUNTER — Encounter: Payer: Self-pay | Admitting: Physical Therapy

## 2022-03-16 ENCOUNTER — Ambulatory Visit: Payer: Medicare Other | Admitting: Physical Therapy

## 2022-03-16 DIAGNOSIS — M5459 Other low back pain: Secondary | ICD-10-CM

## 2022-03-16 DIAGNOSIS — M542 Cervicalgia: Secondary | ICD-10-CM | POA: Diagnosis not present

## 2022-03-16 DIAGNOSIS — G8929 Other chronic pain: Secondary | ICD-10-CM | POA: Diagnosis not present

## 2022-03-16 DIAGNOSIS — M545 Low back pain, unspecified: Secondary | ICD-10-CM | POA: Diagnosis not present

## 2022-03-16 NOTE — Therapy (Signed)
OUTPATIENT PHYSICAL THERAPY TREATMENT NOTE   Patient Name: Kristi Greer MRN: [id number] DOB:[birth year], 71 y.o., female Today's Date: 03/16/2022  PCP: Dr. Nicki Reaper  REFERRING PROVIDER: Dr. Nicki Reaper   END OF SESSION:   PT End of Session - 03/16/22 0808     Visit Number 2    Number of Visits 16    Date for PT Re-Evaluation 05/10/22    Authorization Type Medicare    Progress Note Due on Visit 10    PT Start Time 0805    PT Stop Time 0845    PT Time Calculation (min) 40 min    Equipment Utilized During Treatment Gait belt    Activity Tolerance Patient tolerated treatment well    Behavior During Therapy WFL for tasks assessed/performed             Past Medical History:  Diagnosis Date   Allergy    Atrial fibrillation ([place])    Cancer ([place])    basal cell   Cardiomyopathy, nonischemic ([place])    Carpal tunnel syndrome    bilateral   Congestive heart failure ([place])    normalization of EF with medical therapy   Dysrhythmia 08/31/2015   long RP tachycardia   Environmental and seasonal allergies    Female cystocele    Hyperlipidemia    Hypertension    Hyperthyroidism    Migraines    Overactive detrusor    Personal history of colonic polyps    PONV (postoperative nausea and vomiting)    SUI (stress urinary incontinence, female)    Uterovaginal prolapse    UTI (urinary tract infection)    Vaginal atrophy    Past Surgical History:  Procedure Laterality Date   ANTERIOR AND POSTERIOR VAGINAL REPAIR W/ SACROSPINOUS LIGAMENT SUSPENSION  10/18/2012   CARDIAC CATHETERIZATION N/A 11/26/2015   Procedure: Right/Left Heart Cath and Coronary Angiography;  Surgeon: Jolaine Artist, MD;  Location: [facility];  Service: Cardiovascular;  Laterality: N/A;   CHOLECYSTECTOMY  2006   COLONOSCOPY WITH PROPOFOL N/A 03/19/2017   Procedure: COLONOSCOPY WITH PROPOFOL;  Surgeon: Lollie Sails, MD;  Location: [facility];  Service: Endoscopy;  Laterality: N/A;   COLPOPEXY   10/18/2012   CYSTOURETHROSCOPY  10/18/2012   ESOPHAGOGASTRODUODENOSCOPY (EGD) WITH PROPOFOL N/A 10/03/2016   Procedure: ESOPHAGOGASTRODUODENOSCOPY (EGD) WITH PROPOFOL;  Surgeon: Lollie Sails, MD;  Location: [facility];  Service: Endoscopy;  Laterality: N/A;   EYE SURGERY     cataract extration   FINGER SURGERY     HYSTEROSCOPY  2011, 10/2010   OOPHORECTOMY     REVERSE SHOULDER ARTHROPLASTY Right 06/17/2019   Procedure: REVERSE SHOULDER ARTHROPLASTY;  Surgeon: Corky Mull, MD;  Location: [facility];  Service: Orthopedics;  Laterality: Right;   TONSILLECTOMY     TUBAL LIGATION     VAGINAL HYSTERECTOMY     midurethral sling   Patient Active Problem List   Diagnosis Date Noted   Lumbar spondylosis 03/07/2021   Change in bowel movement 01/30/2021   Stress 09/25/2020   Headache 09/25/2020   Chronic back pain 05/11/2020   History of COVID-19 11/13/2019   Cough 11/13/2019   Nausea 11/06/2019   COVID-19 virus infection 10/30/2019   Status post reverse total shoulder replacement, 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   Pain of left heel 05/03/2019   Immunization counseling 05/03/2019   Right shoulder pain 12/21/2018   Leukocytosis 12/21/2018   Hyperglycemia 12/21/2018   Hyperthyroidism 06/21/2018   Pain of right heel 05/04/2018   Snoring 01/06/2018  Impingement syndrome of shoulder region 09/17/2017   GERD (gastroesophageal reflux disease) 06/24/2017   Bilateral carpal tunnel syndrome 11/29/2016   Carpal tunnel syndrome 11/08/2016   Sinusitis 08/13/2016   Epigastric abdominal pain 05/04/2016   Left sided chest pain 12/05/2015   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[facility])    NSVT (nonsustained ventricular tachycardia) ([facility])    Environmental allergies 33/29/5188   Chronic systolic heart failure ([place]) 09/22/2015   Hypotension 09/22/2015   SVT (supraventricular tachycardia) ([place]) 09/01/2015   Chronic migraine without aura without status migrainosus, not intractable 06/24/2015   Health care  maintenance 04/13/2015   Urinary tract infection without hematuria 08/22/2013   History of migraine headaches 11/11/2012   Hypercholesterolemia 11/11/2012   History of colonic polyps 11/11/2012   Migraines 10/02/2012   Overactive detrusor 10/02/2012   SUI (stress urinary incontinence, female) 10/02/2012   Postop check 10/02/2012   Vaginal prolapse 09/23/2012   Incomplete emptying of bladder 09/23/2012    REFERRING DIAG: M54.2 Other low back Pain                                   M54.59 Cervicalgia   THERAPY DIAG:  Cervicalgia  Other low back pain  Rationale for Evaluation and Treatment Rehabilitation  PERTINENT HISTORY: Per Dr. Bary Leriche note on 02/08/22    HPI With past history of afib, cardiomyopathy, hypertension and hypercholesterolemia.  She comes in today to follow up on these issues as well as for a complete physical exam.  Saw urology 01/26/22 - low back pain and hematuria. Planning for cystoscopy.  Also had f/u with Dr Cruzita Lederer 01/20/22 - f/u thyrotoxicosis.  On methimazole.  Labs checked and recommended for her to decrease methimazole to 2.'5mg'$  q day.  Recheck TFTs in 6 weeks.  States she is doing relatively well.  Breathing stable. No chest pain.  No acid reflux or abdominal reported. Bowels stable.  Some persistent low back pain.  Notices if stands for a long period of time.  Also concerned regarding her posture.   PRECAUTIONS: None   SUBJECTIVE: Patient reports that her back and neck pain has improved since last session because she did not do as much physical activity.   PAIN:  Are you having pain? No   OBJECTIVE:   VITALS: BP 115/59 HR 73 SpO2  100    DIAGNOSTIC FINDINGS:  CLINICAL DATA:  Low back pain radiating into the left hip for several weeks. Patient slipped and fell 5 months ago. No previous relevant surgery.   EXAM: MRI LUMBAR SPINE WITHOUT CONTRAST   TECHNIQUE: Multiplanar, multisequence MR imaging of the lumbar spine was performed. No intravenous  contrast was administered.   COMPARISON:  Lumbar spine radiographs 05/11/2020, thoracic spine radiographs 10/23/2014 and abdominopelvic CT 11/14/2015.   FINDINGS: Segmentation: In correlation with prior imaging, there are small ribs at T12 and 5 lumbar type vertebral bodies.   Alignment:  Mild convex left scoliosis.  Normal lateral alignment.   Vertebrae: Interval development of a mild superior endplate compression fracture at T11. This appears healed without residual marrow hyperintensity on inversion recovery imaging. No evidence of acute fracture or pars defect. The visualized sacroiliac joints appear unremarkable.   Conus medullaris: Extends to the L1 level and appears normal.   Paraspinal and other soft tissues: No significant paraspinal findings. Renal parapelvic cysts are present bilaterally.   Disc levels:   No significant disc space findings from T11-12 through L1-2.  L2-3: Chronic loss of disc height with annular disc bulging and endplate osteophytes asymmetric to the right. Mild facet and ligamentous hypertrophy. Stable mild right lateral recess and right foraminal narrowing without nerve root encroachment.   L3-4: Chronic loss of disc height with annular disc bulging and a small disc protrusion in the right subarticular zone. Mild to moderate facet and ligamentous hypertrophy bilaterally. Resulting mild spinal stenosis with asymmetric narrowing of the right lateral recess and possible right L4 nerve root encroachment. There is also mild left greater than right foraminal narrowing.   L4-5: Preserved disc height. Mild disc bulging, facet and ligamentous hypertrophy. Mild asymmetric left lateral recess and left foraminal narrowing without definite nerve root encroachment.   L5-S1: Preserved disc height. Mild disc bulging and facet hypertrophy. No spinal stenosis or nerve root encroachment.   IMPRESSION: 1. No acute findings or explanation for the patient's  symptoms identified. 2. Chronic spondylosis at L2-3 and L3-4, similar to previous CT. No high-grade spinal stenosis or clear explanation for left radicular symptoms identified. There is mild left foraminal narrowing at L3-4. 3. Healed mild superior endplate compression deformity at T11. No acute osseous findings.     Electronically Signed   By: Richardean Sale M.D.   On: 08/10/2021 14:01   PATIENT SURVEYS:  FOTO 69/100 with target of 73    SCREENING FOR RED FLAGS: Bowel or bladder incontinence: No Spinal tumors: No Cauda equina syndrome: No Compression fracture: No Abdominal aneurysm: No   COGNITION:           Overall cognitive status: Within functional limits for tasks assessed                          SENSATION: WFL   MUSCLE LENGTH: Hamstrings: Right 80 deg; Left 80 deg Thomas test: Bilateral    POSTURE: rounded shoulders, forward head, and increased thoracic kyphosis   PALPATION: Left glute med TTP along with L3-L5 central spinous process    CERVICAL ROM:    Cervical.                                                            AROM   PROM          NORM               flex:    50            50                      50                     ext:    45           45*                 60            AROM R/L      PROM                          NORM     SB:         45/45         40/30  45  rot:          70/70        60/50                                 70-90                                  R      L    Normal   - shoulder flx     180 180          180  - shoulder abd    180 180         180  - shoulder ext     60    60             60  - combined mvt      (touch pt)         - ER       Occiput  Occiput  Occiput         - IR            T7        T7            T7 ER @ 90 deg abd   110 110            90 IR @ 90 deg abd    70   70              70       STRENGTH    Strength R/L 5/5 Shoulder flexion (anterior deltoid/pec  major/coracobrachialis, axillary n. (C5/6) and musculocutaneous n. (C5-7)) 5/5 Shoulder abduction (deltoid/supraspinatus, axillary/suprascapular n, C5) 4/4 Shoulder external rotation (infraspinatus/teres minor) 4/4 Shoulder internal rotation (subcapularis/lats/pec major) 4/4 Shoulder extension (posterior deltoid, lats, teres major, axillary/thoracodorsal n.) 3+/3+ Shoulder horizontal abduction 3+/3+     Lower Trap   5/5 Elbow flexion (biceps brachii, brachialis, brachioradialis, musculoskeletal n, C5/6) 5/5 Elbow extension (triceps, radial n, C7) 5/5 Wrist Extension (C6/7) 5/5 Wrist Flexion (C6/7) 5/5 Finger adduction (interossei, ulnar n, T1)   Cervical Isometrics: NT    LUMBAR ROM:    Active  A/PROM  eval  Flexion 85%*  Extension 100%*  Right lateral flexion 100%*  Left lateral flexion 100%*  Right rotation 100%*  Left rotation 100*   (Blank rows = not tested)     * = painful    LOWER EXTREMITY ROM:            Active  Right 03/14/2022 Left 03/14/2022  Hip flexion 120 120  Hip extension 30 30  Hip abduction 45 45  Hip adduction 30 30  Hip internal rotation 45 45  Hip external rotation 45 45  Knee flexion 135 135  Knee extension 0 0  Ankle dorsiflexion 20 20  Ankle plantarflexion 50 50  Ankle inversion 35 35  Ankle eversion 15 15   (Blank rows = not tested)        LOWER EXTREMITY MMT:     MMT Right eval Left eval  Hip flexion 5 5  Hip extension 4 4  Hip abduction 4- 4-  Hip adduction 4- 4-  Hip internal rotation 4+ 4+  Hip external rotation 4+ 4+  Knee flexion 4+ 4+  Knee extension 5 5  Ankle dorsiflexion 5 5  Ankle  plantarflexion      Ankle inversion      Ankle eversion       (Blank rows = not tested)   LUMBAR SPECIAL TESTS:  FABER test: Negative  FADIR: Negative    FUNCTIONAL TESTS:  None performed    GAIT: Distance walked: 40 ft  Assistive device utilized: None Level of assistance: Complete Independence Comments: Forward head and  rounded shoulders, increased forward flexion       TODAY'S TREATMENT   03/16/22:  THEREX:  Cervical.                                                            AROM   PROM          NORM               flex:    50            50                      50                     ext:    45           45*                 60            AROM R/L      PROM                          NORM     SB:         45/45         40/30                                  45  rot:          70/70        60/50                                 70-90     Upper Trap Stretch 4 x 60 sec  Neck Extension Stretch 2 x 60 sec  Standing Hip Abduction 2 x 10  -min VC to maintain upright posture and stop lateral lean  Supine Bridges 3 x 10 Supine Double Bent Leg Lifts 3 x 10    Initial  Lower trunk rotations 3 x 10  Butterfly with forward flexion 2 x 60 sec  Seated HS Stretch 2 x 60 sec  Seated Hip ER Stretch 2 x 60 sec  Figure 4 Stretch 2 x 60 sec with strap      PATIENT EDUCATION:  Education details: form and technique for appropriate exercise and explanation about plan of care  Person educated: Patient Education method: Consulting civil engineer, Demonstration, Verbal cues, and Handouts Education comprehension: verbalized understanding, returned demonstration, and verbal cues required     HOME EXERCISE PROGRAM: Access Code: [email or web address] URL: [email or web address] Date: 03/14/2022 Prepared by: Bradly Chris   Exercises - Supine Lower Trunk Rotation  - 1 x daily - 7 x weekly -  3 sets - 10 reps - Seated Hamstring Stretch  - 1 x daily - 7 x weekly - 1 sets - 3 reps - 60 hold - Seated Hip External Rotation Stretch  - 1 x daily - 7 x weekly - 1 sets - 3 reps - 60 hold - Bound Angle Hands Forward   - 1 x daily - 7 x weekly - 1 sets - 3 reps - 60 hold   ASSESSMENT:   CLINICAL IMPRESSION:  Pt exhibits decreased cervical motion as evidenced by decreased PROM during today's session. She was able to complete all  strengthening and stretching exercises without an increase in her low back or cervical pain with the exception of cervical extension stretch which she graded to avoid terminal neck extension. Pt will continue to benefit skilled PT to improve cervical and lumbar ROM and hip strength and flexibility to bend and lift without experiencing excess pain to garden recreationally and maintain her pool.     OBJECTIVE IMPAIRMENTS Abnormal gait, difficulty walking, decreased ROM, decreased strength, impaired flexibility, and pain.    ACTIVITY LIMITATIONS carrying, lifting, bending, standing, sleeping, stairs, and locomotion level   PARTICIPATION LIMITATIONS: cleaning, laundry, shopping, community activity, and yard work   PERSONAL FACTORS Age, Past/current experiences, Sex, Time since onset of injury/illness/exacerbation, and 1-2 comorbidities: chronic low back pain, afib,  are also affecting patient's functional outcome.    REHAB POTENTIAL: Good   CLINICAL DECISION MAKING: Stable/uncomplicated   EVALUATION COMPLEXITY: Low     GOALS: Goals reviewed with patient? No   SHORT TERM GOALS: Target date: 03/29/2022   Pt will be independent with HEP in order to improve strength and balance in order to decrease fall risk and improve function at home and work. Baseline: NT  Goal status: ongoing    2.  Patient will be able to achieve 100% lumbar flexion as evidence of improved symptom response to lumbar movement and improved hamstring length.  Baseline: 85% Goal status: ONGOING    3.  Patient will show understanding of posture by demonstrating accurate seated posture mechanics.  Baseline: Forward Head and Rounded Shoulders  Goal status: ONGOING      LONG TERM GOALS: Target date: 05/10/2022   Patient will have improved function and activity level as evidenced by an increase in FOTO score by 10 points or more.  Baseline: 69/100 with target score 73 Goal status: ONGOING    2.  Patient will improve  cervical extension AROM to 60 as evidence of improved posture and cervical muscle tension.  Baseline: 45 Goal status: ONGOING    3.  Patient will improve hip and parascapular strength to better support the surrounding structures in the spine in order to decrease symptoms.  Baseline: Hip Abd R/L 4-/4- Add R/L 4-/4- Ext R/L 4/4, Mid Trap 3+/3+, Lower Trap 3+/3+, Shoulder Ext 3+/3+ Goal status: ONGOING        PLAN: PT FREQUENCY: 1-2x/week   PT DURATION: 8 weeks   PLANNED INTERVENTIONS: Therapeutic exercises, Therapeutic activity, Neuromuscular re-education, Balance training, Gait training, Patient/Family education, Self Care, Joint mobilization, Joint manipulation, Aquatic Therapy, Dry Needling, Electrical stimulation, Spinal manipulation, Spinal mobilization, Cryotherapy, Moist heat, Traction, Manual therapy, and Re-evaluation.   PLAN FOR NEXT SESSION: Progress cervical and lumbar strengthening and stretching exercises      Daneil Dan, PT 03/16/2022, 8:09 AM

## 2022-03-20 ENCOUNTER — Ambulatory Visit (INDEPENDENT_AMBULATORY_CARE_PROVIDER_SITE_OTHER): Payer: Medicare Other | Admitting: Urology

## 2022-03-20 ENCOUNTER — Encounter: Payer: Self-pay | Admitting: Urology

## 2022-03-20 VITALS — BP 99/64 | HR 69 | Ht 61.0 in | Wt 153.0 lb

## 2022-03-20 DIAGNOSIS — R3129 Other microscopic hematuria: Secondary | ICD-10-CM

## 2022-03-20 DIAGNOSIS — N3946 Mixed incontinence: Secondary | ICD-10-CM

## 2022-03-20 LAB — URINALYSIS, COMPLETE
Bilirubin, UA: NEGATIVE
Glucose, UA: NEGATIVE
Ketones, UA: NEGATIVE
Nitrite, UA: NEGATIVE
Specific Gravity, UA: 1.03 (ref 1.005–1.030)
Urobilinogen, Ur: 0.2 mg/dL (ref 0.2–1.0)
pH, UA: 5 (ref 5.0–7.5)

## 2022-03-20 LAB — MICROSCOPIC EXAMINATION
RBC, Urine: >30 /hpf — AB (ref 0–2)
WBC, UA: >30 /hpf — AB (ref 0–5)

## 2022-03-20 MED ORDER — CIPROFLOXACIN HCL 250 MG PO TABS: 250.0000 mg | ORAL_TABLET | Freq: Two times a day (BID) | ORAL | 0 refills | Status: AC

## 2022-03-20 MED ORDER — TRIMETHOPRIM 100 MG PO TABS: 100.0000 mg | ORAL_TABLET | Freq: Every day | ORAL | 11 refills | Status: DC

## 2022-03-20 NOTE — Progress Notes (Signed)
03/20/2022 10:25 AM   Kristi Greer [name] 1950-10-01 [id number]  Referring provider: Einar Pheasant, MD [address] [place],  [place] [address]  Chief Complaint  Patient presents with   Cysto    HPI: I was consulted to assess the patient's prolapse.  She feels vaginal bulging.  Sometimes she reduces it.  Sometimes is uncomfortable after standing and then with sitting.  No splinting maneuvers.  She describes a hysterectomy and bladder suspension possibly 7 or 8 years ago done transvaginally   Sometimes leaks with coughing sneezing but not bending lifting.  Primary symptom is urge incontinence.  No bedwetting.  Wears 2 or 3 pads a day in severity depends on degree of urgency.  Sometimes the pads are soaked.   She is time voiding every 1 hour but cannot hold it for 2 hours.  She gets up 4-5 times at night.  Takes Lasix.  No ankle edema.   Flow is slower.  Sometimes it stops and starts.  She feels empty   She gets 2 bladder infections that respond favorably to antibiotics per year.  She has suprapubic pressure low back pain and increased frequency.      On pelvic examination patient has small grade 3 cystocele with central defect that reached the introitus.  Vaginal cuff descended from approximately 8 cm to 5 cm.  No rectocele.  Well supported bladder neck.  No stress incontinence with cystocele reduced.  No mesh extrusion.  Vaginal length mildly decreased   Patient has mixed incontinence.  She primarily has urge incontinence.  She has a frequent bladder.     Patient and her husband would like to avoid surgery.  I briefly reviewed it with her.  She would like her urge incontinence treated with Myrbetriq 50 mg samples and prescription and see a gynecologist for pessary fitting and will send a referral.  She would like to be sexually active but I think she can be taught how to change it herself.  We will proceed accordingly.  She understands in most cases of prolapse  is not related to the overactive bladder and urgency as her dominating complaint.  It appears she had a lot of discomfort after her last operation hopes not to have another   Patient was seen in June 2021. Patient is more uncomfortable with her prolapse.  She can be standing and then when she sits it is uncomfortable.  She did not like the pessary.  She reports the Myrbetriq failed  Currently she has urge incontinence wearing 1-4 pads a day moderately wet to soaked.  It depends on fluid intake.  She denies stress incontinence and bedwetting.  She can void every hour and cannot hold it for 2 hours.  She gets up 4-5 times a night.  She supposed to take Lasix every second day but is noncompliant.  No ankle edema  Flow was slow.  Sometimes it sprays.  She feels empty.  Sometimes she double voids a reasonable amount   On pelvic examination patient had a grade 3 cystocele with central defect.  Her vaginal length decreased from 8 or 9 cm to approximately 4 cm.  With the prolapse reduced she had no rectocele or stress incontinence.     Picture drawn.  Role of urodynamics discussed.  She understands this could be repaired vaginally but that a robotic procedure has now become an excellent alternative.  It would also maximize vaginal length.  She understands that the prolapse surgery is not for her  urgency incontinence   Today Frequency stable.  Incontinence stable On urodynamics patient did not void and was catheterized for a few milliliters.  Maximum bladder capacity was 140 mL.  She had increased bladder sensation.  Her bladder was unstable in the overactivity was provoked by Valsalva maneuvers and removing the vaginal packing.  An unstable bladder contraction reached a pressure of 77 cm of water.  She felt urgency but did not leak.  No stress incontinence with Valsalva pressure of 63 cm of water.  During voluntary voiding she voided 135 mL with a maximal flow of 8 mils per second.  Max voiding pressure 54  cm water.  Residual 55 mL.  EMG activity decreased during voiding.  Bladder neck descent at 1 or 2 cm.  The details of the urodynamics are signed dictated   Picture drawn Patient understands the treatment of her urgency incontinence is medical and third line therapies.  She understands that if she has prolapse surgery that it generally persists postoperatively.  We talked about prolapse surgery and especially the robotic procedure.  She understands that likely her urge incontinence will persist and she can have worsening of urgency or stress incontinence afterwards.  In my opinion she should not have a simultaneous sling.  She is sexually active and this repair will also maximize vaginal length.  I will copy this note in [place] and she will see Dr. Louis Meckel    Patient will follow-up with Dr. Louis Meckel who may or may not perform cystoscopy  TOday In June 2023 patient saw nurse practitioner with back pain and possible bladder infection with microscopic hematuria.  There were calcium oxalate crystals.  She had a CT scan that showed stones in the cystocele that were layering and the stone burden was mild but a bit surprising.  Urine culture was negative  Because of wanting to be able to lift and golf she is going to have her prolapse repair by Dr. Louis Meckel in the late year  Patient says she gets about 2 bladder infections a year but symptoms were nonspecific and she reports back pain.  She has never smoked  Cystoscopy: Patient underwent flexible cystoscopy.  Urine was very cloudy.  There was mild erythema throughout the bladder.  There was a lot of white flecks in the urine.  I reduce the cystocele with a gauze and speculum and I can see multiple small stones very similar to what is seen on the CT scan.  There was a lot of erythema and possibly some edema.  There was almost hyphae and some of the calcification may have been stuck to the urothelium . the findings look more like cystitis and not carcinoma.   It was behind the trigone which was elevated bit by the speculum and gauze    PMH: Past Medical History:  Diagnosis Date   Allergy    Atrial fibrillation ([place])    Cancer ([place])    basal cell   Cardiomyopathy, nonischemic ([place])    Carpal tunnel syndrome    bilateral   Congestive heart failure ([place])    normalization of EF with medical therapy   Dysrhythmia 08/31/2015   long RP tachycardia   Environmental and seasonal allergies    Female cystocele    Hyperlipidemia    Hypertension    Hyperthyroidism    Migraines    Overactive detrusor    Personal history of colonic polyps    PONV (postoperative nausea and vomiting)    SUI (stress urinary incontinence, female)  Uterovaginal prolapse    UTI (urinary tract infection)    Vaginal atrophy     Surgical History: Past Surgical History:  Procedure Laterality Date   ANTERIOR AND POSTERIOR VAGINAL REPAIR W/ SACROSPINOUS LIGAMENT SUSPENSION  10/18/2012   CARDIAC CATHETERIZATION N/A 11/26/2015   Procedure: Right/Left Heart Cath and Coronary Angiography;  Surgeon: Jolaine Artist, MD;  Location: [facility];  Service: Cardiovascular;  Laterality: N/A;   CHOLECYSTECTOMY  2006   COLONOSCOPY WITH PROPOFOL N/A 03/19/2017   Procedure: COLONOSCOPY WITH PROPOFOL;  Surgeon: Lollie Sails, MD;  Location: [facility] ENDOSCOPY;  Service: Endoscopy;  Laterality: N/A;   COLPOPEXY  10/18/2012   CYSTOURETHROSCOPY  10/18/2012   ESOPHAGOGASTRODUODENOSCOPY (EGD) WITH PROPOFOL N/A 10/03/2016   Procedure: ESOPHAGOGASTRODUODENOSCOPY (EGD) WITH PROPOFOL;  Surgeon: Lollie Sails, MD;  Location: [facility] ENDOSCOPY;  Service: Endoscopy;  Laterality: N/A;   EYE SURGERY     cataract extration   FINGER SURGERY     HYSTEROSCOPY  2011, 10/2010   OOPHORECTOMY     REVERSE SHOULDER ARTHROPLASTY Right 06/17/2019   Procedure: REVERSE SHOULDER ARTHROPLASTY;  Surgeon: Corky Mull, MD;  Location: [facility];  Service: Orthopedics;  Laterality: Right;    TONSILLECTOMY     TUBAL LIGATION     VAGINAL HYSTERECTOMY     midurethral sling    Home Medications:  Allergies as of 03/20/2022   No Known Allergies      Medication List        Accurate as of March 20, 2022 10:25 AM. If you have any questions, ask your nurse or doctor.          acetaminophen 500 MG tablet Commonly known as: TYLENOL Take 500-1,000 mg by mouth every 6 (six) hours as needed for moderate pain or headache.   calcium carbonate 1500 (600 Ca) MG Tabs tablet Commonly known as: OSCAL Take 1,500 mg by mouth daily.   calcium carbonate 500 MG chewable tablet Commonly known as: TUMS - dosed in mg elemental calcium Chew 1 tablet by mouth daily as needed for indigestion or heartburn.   carvedilol 12.5 MG tablet Commonly known as: COREG TAKE 1 TABLET (12.5 MG TOTAL) BY MOUTH 2 (TWO) TIMES DAILY WITH A MEAL.   Cholecalciferol 25 MCG (1000 UT) capsule Take 1,000 Units by mouth daily.   fexofenadine 180 MG tablet Commonly known as: ALLEGRA Take 180 mg by mouth at bedtime.   furosemide 20 MG tablet Commonly known as: LASIX TAKE 1 TABLET (20 MG TOTAL) BY MOUTH EVERY OTHER DAY.   methimazole 5 MG tablet Commonly known as: TAPAZOLE Take 0.5 tablets (2.5 mg total) by mouth daily.   pantoprazole 40 MG tablet Commonly known as: PROTONIX Take 40 mg by mouth daily.   rosuvastatin 10 MG tablet Commonly known as: CRESTOR TAKE 1 TABLET BY MOUTH EVERY DAY   SUMAtriptan 100 MG tablet Commonly known as: IMITREX TAKE 1 TABLET BY MOUTH FOR 1 DOSE. MAY REPEAT IN 2 HOURS IF HEADACHE PERSISTS OR RECURS   tiZANidine 2 MG tablet Commonly known as: ZANAFLEX Take 1 tablet (2 mg total) by mouth at bedtime as needed for muscle spasms.   triamcinolone 55 MCG/ACT Aero nasal inhaler Commonly known as: NASACORT Place 1 spray into the nose daily as needed (allergies).   zonisamide 100 MG capsule Commonly known as: ZONEGRAN TAKE 1 CAPSULE BY MOUTH EVERY DAY         Allergies: No Known Allergies  Family History: Family History  Problem Relation Age of Onset  Hypertension Mother    Stroke Mother    Diabetes Mother    Hypercholesterolemia Father    Heart disease Father        MI   Hypertension Father    Stroke Father    Breast cancer Neg Hx    Colon cancer Neg Hx    Thyroid disease Neg Hx     Social History:  reports that she has never smoked. She has never been exposed to tobacco smoke. She has never used smokeless tobacco. She reports current alcohol use. She reports that she does not use drugs.  ROS:                                        Physical Exam: LMP 10/18/2012   Constitutional:  Alert and oriented, No acute distress. HEENT: [place] AT, moist mucus membranes.  Trachea midline, no masses.   Laboratory Data: Lab Results  Component Value Date   WBC 7.4 10/10/2021   HGB 12.6 10/10/2021   HCT 39.4 10/10/2021   MCV 83.9 10/10/2021   PLT 200.0 10/10/2021    Lab Results  Component Value Date   CREATININE 0.79 02/08/2022    No results found for: "PSA"  No results found for: "TESTOSTERONE"  Lab Results  Component Value Date   HGBA1C 6.0 02/08/2022    Urinalysis    Component Value Date/Time   COLORURINE YELLOW 01/17/2022 1451   APPEARANCEUR Cloudy (A) 01/26/2022 1038   LABSPEC 1.015 01/17/2022 1451   PHURINE 7.0 01/17/2022 1451   GLUCOSEU Negative 01/26/2022 1038   GLUCOSEU NEGATIVE 01/17/2022 1451   HGBUR SMALL (A) 01/17/2022 1451   BILIRUBINUR Negative 01/26/2022 1038   KETONESUR NEGATIVE 01/17/2022 1451   PROTEINUR 1+ (A) 01/26/2022 1038   PROTEINUR NEGATIVE 11/15/2015 0003   UROBILINOGEN 0.2 01/17/2022 1451   NITRITE Negative 01/26/2022 1038   NITRITE NEGATIVE 01/17/2022 1451   LEUKOCYTESUR 1+ (A) 01/26/2022 1038   LEUKOCYTESUR MODERATE (A) 01/17/2022 1451    Pertinent Imaging: Urine reviewed.  Urine sent for culture.  Assessment & Plan: Picture was drawn.  Patient  understands that it is very uncommon for a woman to have stones in the floor of her bladder but she does have an impressive anatomic defect and could have some stasis.  Having said that her residual urine volumes have been within normal limits.  The tissue did look inflamed.  I do not think she has carcinoma.  I would like to put her on ciprofloxacin 250 mg twice a day for 7 days and then trimethoprim 100 mg 30x11.  I will have her see my partner in my opinion she should have a cystoscopy under anesthesia with removal of stones and possible bladder biopsies.  This is independent of her prolapse surgery.  I think this would be preferred over doing it at the time of surgery.  It may be reasonable to keep her on her prophylaxis after she stone free until she has her prolapse surgery.  I suspect she has had crystals sitting in a dependent cystocele as the underlying cause  1. Other microscopic hematuria  - Urinalysis, Complete   No follow-ups on file.  Reece Packer, MD  [facility] [address] [place], [place] [address] [phone]

## 2022-03-22 ENCOUNTER — Ambulatory Visit: Payer: Medicare Other | Admitting: Physical Therapy

## 2022-03-22 ENCOUNTER — Encounter: Payer: Self-pay | Admitting: Physical Therapy

## 2022-03-22 DIAGNOSIS — M5459 Other low back pain: Secondary | ICD-10-CM

## 2022-03-22 DIAGNOSIS — G8929 Other chronic pain: Secondary | ICD-10-CM | POA: Diagnosis not present

## 2022-03-22 DIAGNOSIS — M542 Cervicalgia: Secondary | ICD-10-CM

## 2022-03-22 DIAGNOSIS — M545 Low back pain, unspecified: Secondary | ICD-10-CM | POA: Diagnosis not present

## 2022-03-22 NOTE — Therapy (Signed)
OUTPATIENT PHYSICAL THERAPY TREATMENT NOTE   Patient Name: Kristi Greer MRN: [id number] DOB:03/21/1951, 71 y.o., female Today's Date: 03/22/2022  PCP: Dr. Nicki Reaper  REFERRING PROVIDER: Dr. Nicki Reaper   END OF SESSION:   PT End of Session - 03/22/22 1639     Visit Number 3    Number of Visits 16    Date for PT Re-Evaluation 05/10/22    Authorization Type Medicare    Progress Note Due on Visit 10    PT Start Time 1635    PT Stop Time 4098    PT Time Calculation (min) 40 min    Equipment Utilized During Treatment Gait belt    Activity Tolerance Patient tolerated treatment well    Behavior During Therapy WFL for tasks assessed/performed             Past Medical History:  Diagnosis Date   Allergy    Atrial fibrillation ([place])    Cancer ([place])    basal cell   Cardiomyopathy, nonischemic ([place])    Carpal tunnel syndrome    bilateral   Congestive heart failure ([place])    normalization of EF with medical therapy   Dysrhythmia 08/31/2015   long RP tachycardia   Environmental and seasonal allergies    Female cystocele    Hyperlipidemia    Hypertension    Hyperthyroidism    Migraines    Overactive detrusor    Personal history of colonic polyps    PONV (postoperative nausea and vomiting)    SUI (stress urinary incontinence, female)    Uterovaginal prolapse    UTI (urinary tract infection)    Vaginal atrophy    Past Surgical History:  Procedure Laterality Date   ANTERIOR AND POSTERIOR VAGINAL REPAIR W/ SACROSPINOUS LIGAMENT SUSPENSION  10/18/2012   CARDIAC CATHETERIZATION N/A 11/26/2015   Procedure: Right/Left Heart Cath and Coronary Angiography;  Surgeon: Jolaine Artist, MD;  Location: [facility];  Service: Cardiovascular;  Laterality: N/A;   CHOLECYSTECTOMY  2006   COLONOSCOPY WITH PROPOFOL N/A 03/19/2017   Procedure: COLONOSCOPY WITH PROPOFOL;  Surgeon: Lollie Sails, MD;  Location: [facility] ENDOSCOPY;  Service: Endoscopy;  Laterality: N/A;   COLPOPEXY   10/18/2012   CYSTOURETHROSCOPY  10/18/2012   ESOPHAGOGASTRODUODENOSCOPY (EGD) WITH PROPOFOL N/A 10/03/2016   Procedure: ESOPHAGOGASTRODUODENOSCOPY (EGD) WITH PROPOFOL;  Surgeon: Lollie Sails, MD;  Location: [facility] ENDOSCOPY;  Service: Endoscopy;  Laterality: N/A;   EYE SURGERY     cataract extration   FINGER SURGERY     HYSTEROSCOPY  2011, 10/2010   OOPHORECTOMY     REVERSE SHOULDER ARTHROPLASTY Right 06/17/2019   Procedure: REVERSE SHOULDER ARTHROPLASTY;  Surgeon: Corky Mull, MD;  Location: [facility] ORS;  Service: Orthopedics;  Laterality: Right;   TONSILLECTOMY     TUBAL LIGATION     VAGINAL HYSTERECTOMY     midurethral sling   Patient Active Problem List   Diagnosis Date Noted   Lumbar spondylosis 03/07/2021   Change in bowel movement 01/30/2021   Stress 09/25/2020   Headache 09/25/2020   Chronic back pain 05/11/2020   History of COVID-19 11/13/2019   Cough 11/13/2019   Nausea 11/06/2019   COVID-19 virus infection 10/30/2019   Status post reverse total shoulder replacement, 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   Pain of left heel 05/03/2019   Immunization counseling 05/03/2019   Right shoulder pain 12/21/2018   Leukocytosis 12/21/2018   Hyperglycemia 12/21/2018   Hyperthyroidism 06/21/2018   Pain of right heel 05/04/2018   Snoring 01/06/2018  Impingement syndrome of shoulder region 09/17/2017   GERD (gastroesophageal reflux disease) 06/24/2017   Bilateral carpal tunnel syndrome 11/29/2016   Carpal tunnel syndrome 11/08/2016   Sinusitis 08/13/2016   Epigastric abdominal pain 05/04/2016   Left sided chest pain 12/05/2015   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[facility])    NSVT (nonsustained ventricular tachycardia) ([facility])    Environmental allergies 35/45/6256   Chronic systolic heart failure ([place]) 09/22/2015   Hypotension 09/22/2015   SVT (supraventricular tachycardia) ([place]) 09/01/2015   Chronic migraine without aura without status migrainosus, not intractable 06/24/2015   Health care  maintenance 04/13/2015   Urinary tract infection without hematuria 08/22/2013   History of migraine headaches 11/11/2012   Hypercholesterolemia 11/11/2012   History of colonic polyps 11/11/2012   Migraines 10/02/2012   Overactive detrusor 10/02/2012   SUI (stress urinary incontinence, female) 10/02/2012   Postop check 10/02/2012   Vaginal prolapse 09/23/2012   Incomplete emptying of bladder 09/23/2012    REFERRING DIAG: M54.2 Other low back Pain                                   M54.59 Cervicalgia   THERAPY DIAG:  Cervicalgia  Other low back pain  Rationale for Evaluation and Treatment Rehabilitation  PERTINENT HISTORY: Per Dr. Bary Leriche note on 02/08/22    HPI With past history of afib, cardiomyopathy, hypertension and hypercholesterolemia.  She comes in today to follow up on these issues as well as for a complete physical exam.  Saw urology 01/26/22 - low back pain and hematuria. Planning for cystoscopy.  Also had f/u with Dr Cruzita Lederer 01/20/22 - f/u thyrotoxicosis.  On methimazole.  Labs checked and recommended for her to decrease methimazole to 2.'5mg'$  q day.  Recheck TFTs in 6 weeks.  States she is doing relatively well.  Breathing stable. No chest pain.  No acid reflux or abdominal reported. Bowels stable.  Some persistent low back pain.  Notices if stands for a long period of time.  Also concerned regarding her posture.   PRECAUTIONS: None   SUBJECTIVE: Patient reports having doctor's apt recently where it was determined she needs a procedure to remove kidney stones. She thinks this might have something to with the pain in the left side of her low back.   PAIN:  Are you having pain? No   OBJECTIVE:   VITALS: BP 115/59 HR 73 SpO2  100    DIAGNOSTIC FINDINGS:  CLINICAL DATA:  Low back pain radiating into the left hip for several weeks. Patient slipped and fell 5 months ago. No previous relevant surgery.   EXAM: MRI LUMBAR SPINE WITHOUT CONTRAST   TECHNIQUE: Multiplanar,  multisequence MR imaging of the lumbar spine was performed. No intravenous contrast was administered.   COMPARISON:  Lumbar spine radiographs 05/11/2020, thoracic spine radiographs 10/23/2014 and abdominopelvic CT 11/14/2015.   FINDINGS: Segmentation: In correlation with prior imaging, there are small ribs at T12 and 5 lumbar type vertebral bodies.   Alignment:  Mild convex left scoliosis.  Normal lateral alignment.   Vertebrae: Interval development of a mild superior endplate compression fracture at T11. This appears healed without residual marrow hyperintensity on inversion recovery imaging. No evidence of acute fracture or pars defect. The visualized sacroiliac joints appear unremarkable.   Conus medullaris: Extends to the L1 level and appears normal.   Paraspinal and other soft tissues: No significant paraspinal findings. Renal parapelvic cysts are present bilaterally.   Disc  levels:   No significant disc space findings from T11-12 through L1-2.   L2-3: Chronic loss of disc height with annular disc bulging and endplate osteophytes asymmetric to the right. Mild facet and ligamentous hypertrophy. Stable mild right lateral recess and right foraminal narrowing without nerve root encroachment.   L3-4: Chronic loss of disc height with annular disc bulging and a small disc protrusion in the right subarticular zone. Mild to moderate facet and ligamentous hypertrophy bilaterally. Resulting mild spinal stenosis with asymmetric narrowing of the right lateral recess and possible right L4 nerve root encroachment. There is also mild left greater than right foraminal narrowing.   L4-5: Preserved disc height. Mild disc bulging, facet and ligamentous hypertrophy. Mild asymmetric left lateral recess and left foraminal narrowing without definite nerve root encroachment.   L5-S1: Preserved disc height. Mild disc bulging and facet hypertrophy. No spinal stenosis or nerve root  encroachment.   IMPRESSION: 1. No acute findings or explanation for the patient's symptoms identified. 2. Chronic spondylosis at L2-3 and L3-4, similar to previous CT. No high-grade spinal stenosis or clear explanation for left radicular symptoms identified. There is mild left foraminal narrowing at L3-4. 3. Healed mild superior endplate compression deformity at T11. No acute osseous findings.     Electronically Signed   By: Richardean Sale M.D.   On: 08/10/2021 14:01   PATIENT SURVEYS:  FOTO 69/100 with target of 73    SCREENING FOR RED FLAGS: Bowel or bladder incontinence: No Spinal tumors: No Cauda equina syndrome: No Compression fracture: No Abdominal aneurysm: No   COGNITION:           Overall cognitive status: Within functional limits for tasks assessed                          SENSATION: WFL   MUSCLE LENGTH: Hamstrings: Right 80 deg; Left 80 deg Thomas test: Bilateral    POSTURE: rounded shoulders, forward head, and increased thoracic kyphosis   PALPATION: Left glute med TTP along with L3-L5 central spinous process    CERVICAL ROM:    Cervical.                                                            AROM   PROM          NORM               flex:    50            50                      50                     ext:    45           45*                 60            AROM R/L      PROM                          NORM     SB:  45/45         40/30                                  45  rot:          70/70        60/50                                 70-90                                  R      L    Normal   - shoulder flx     180 180          180  - shoulder abd    180 180         180  - shoulder ext     60    60             60  - combined mvt      (touch pt)         - ER       Occiput  Occiput  Occiput         - IR            T7        T7            T7 ER @ 90 deg abd   110 110            90 IR @ 90 deg abd    70   70              70        STRENGTH    Strength R/L 5/5 Shoulder flexion (anterior deltoid/pec major/coracobrachialis, axillary n. (C5/6) and musculocutaneous n. (C5-7)) 5/5 Shoulder abduction (deltoid/supraspinatus, axillary/suprascapular n, C5) 4/4 Shoulder external rotation (infraspinatus/teres minor) 4/4 Shoulder internal rotation (subcapularis/lats/pec major) 4/4 Shoulder extension (posterior deltoid, lats, teres major, axillary/thoracodorsal n.) 3+/3+ Shoulder horizontal abduction 3+/3+     Lower Trap   5/5 Elbow flexion (biceps brachii, brachialis, brachioradialis, musculoskeletal n, C5/6) 5/5 Elbow extension (triceps, radial n, C7) 5/5 Wrist Extension (C6/7) 5/5 Wrist Flexion (C6/7) 5/5 Finger adduction (interossei, ulnar n, T1)   Cervical Isometrics: NT    LUMBAR ROM:    Active  A/PROM  eval  Flexion 85%*  Extension 100%*  Right lateral flexion 100%*  Left lateral flexion 100%*  Right rotation 100%*  Left rotation 100*   (Blank rows = not tested)     * = painful    LOWER EXTREMITY ROM:            Active  Right 03/14/2022 Left 03/14/2022  Hip flexion 120 120  Hip extension 30 30  Hip abduction 45 45  Hip adduction 30 30  Hip internal rotation 45 45  Hip external rotation 45 45  Knee flexion 135 135  Knee extension 0 0  Ankle dorsiflexion 20 20  Ankle plantarflexion 50 50  Ankle inversion 35 35  Ankle eversion 15 15   (Blank rows = not tested)        LOWER EXTREMITY MMT:     MMT Right eval Left eval  Hip flexion 5 5  Hip extension 4 4  Hip abduction 4- 4-  Hip adduction 4- 4-  Hip internal rotation 4+ 4+  Hip external rotation 4+ 4+  Knee flexion 4+ 4+  Knee extension 5 5  Ankle dorsiflexion 5 5  Ankle plantarflexion      Ankle inversion      Ankle eversion       (Blank rows = not tested)   LUMBAR SPECIAL TESTS:  FABER test: Negative  FADIR: Negative    FUNCTIONAL TESTS:  None performed    GAIT: Distance walked: 40 ft  Assistive device utilized:  None Level of assistance: Complete Independence Comments: Forward head and rounded shoulders, increased forward flexion       TODAY'S TREATMENT   03/22/22  UBE level 2 resistance with seat at 6   Seated Assisted Cervical Rotation with Towel 2 x 10  -min VC for sequence of exercise   Dead Bugs 3 x 10  -min VC for sequencing exercise   Lower Trunk Rotation 3 x 10   Figure 4 Bridges 3 x 10   Single Leg Stance 5 x 10 sec   Heel Raises    03/16/22:  THEREX:  Cervical.                                                            AROM   PROM          NORM               flex:    50            50                      50                     ext:    45           45*                 60            AROM R/L      PROM                          NORM     SB:         45/45         40/30                                  45  rot:          70/70        60/50                                 70-90     Upper Trap Stretch 4 x 60 sec  Neck Extension Stretch 2 x 60 sec  Standing Hip Abduction 2 x 10  -min VC to maintain upright posture and stop lateral lean  Supine Bridges 3 x 10 Supine Double Bent Leg Lifts 3 x 10    Initial  Lower trunk rotations 3 x  10  Butterfly with forward flexion 2 x 60 sec  Seated HS Stretch 2 x 60 sec  Seated Hip ER Stretch 2 x 60 sec  Figure 4 Stretch 2 x 60 sec with strap      PATIENT EDUCATION:  Education details: form and technique for appropriate exercise and explanation about plan of care  Person educated: Patient Education method: Explanation, Demonstration, Verbal cues, and Handouts Education comprehension: verbalized understanding, returned demonstration, and verbal cues required     HOME EXERCISE PROGRAM: Access Code: [email or web address] URL: [email or web address] Date: 03/22/2022 Prepared by: Bradly Chris  Exercises - Supine Lower Trunk Rotation  - 1 x daily - 7 x weekly - 3 sets - 10 reps - Seated Hamstring Stretch  - 1 x daily - 7 x  weekly - 1 sets - 3 reps - 60 hold - Seated Hip External Rotation Stretch  - 1 x daily - 7 x weekly - 1 sets - 3 reps - 60 hold - Bound Angle Hands Forward   - 1 x daily - 7 x weekly - 1 sets - 3 reps - 60 hold - Cervical Extension Stretch  - 1 x daily - 7 x weekly - 1 sets - 3 reps - 60 hold - Standing Hip Abduction with Counter Support  - 1 x daily - 3 x weekly - 3 sets - 10 reps - Seated Assisted Cervical Rotation with Towel  - 1 x daily - 7 x weekly - 1 sets - 10 reps - Figure 4 Bridge  - 1 x daily - 3 x weekly - 3 sets - 10 reps - Supine Dead Bug with Leg Extension  - 1 x daily - 3 x weekly - 3 sets - 10 reps - Standing Heel Raise  - 1 x daily - 3 x weekly - 3 sets - 10 reps   ASSESSMENT:   CLINICAL IMPRESSION: Pt shows an improvement in abdominal LE strength with ability to perform single leg bridges and dead bugs. She did not have an increase in her cervical or low back pain throughout session despite exercise progressions. She will continue to benefit skilled PT to improve cervical and lumbar ROM and hip strength and flexibility to bend and lift without experiencing excess pain to garden recreationally and maintain her pool.    OBJECTIVE IMPAIRMENTS Abnormal gait, difficulty walking, decreased ROM, decreased strength, impaired flexibility, and pain.    ACTIVITY LIMITATIONS carrying, lifting, bending, standing, sleeping, stairs, and locomotion level   PARTICIPATION LIMITATIONS: cleaning, laundry, shopping, community activity, and yard work   PERSONAL FACTORS Age, Past/current experiences, Sex, Time since onset of injury/illness/exacerbation, and 1-2 comorbidities: chronic low back pain, afib,  are also affecting patient's functional outcome.    REHAB POTENTIAL: Good   CLINICAL DECISION MAKING: Stable/uncomplicated   EVALUATION COMPLEXITY: Low     GOALS: Goals reviewed with patient? No   SHORT TERM GOALS: Target date: 03/29/2022   Pt will be independent with HEP in order to  improve strength and balance in order to decrease fall risk and improve function at home and work. Baseline: NT  Goal status: ongoing    2.  Patient will be able to achieve 100% lumbar flexion as evidence of improved symptom response to lumbar movement and improved hamstring length.  Baseline: 85% Goal status: ONGOING    3.  Patient will show understanding of posture by demonstrating accurate seated posture mechanics.  Baseline: Forward Head and Rounded Shoulders  Goal status: ONGOING  LONG TERM GOALS: Target date: 05/10/2022   Patient will have improved function and activity level as evidenced by an increase in FOTO score by 10 points or more.  Baseline: 69/100 with target score 73 Goal status: ONGOING    2.  Patient will improve cervical extension AROM to 60 as evidence of improved posture and cervical muscle tension.  Baseline: 45 Goal status: ONGOING    3.  Patient will improve hip and parascapular strength to better support the surrounding structures in the spine in order to decrease symptoms.  Baseline: Hip Abd R/L 4-/4- Add R/L 4-/4- Ext R/L 4/4, Mid Trap 3+/3+, Lower Trap 3+/3+, Shoulder Ext 3+/3+ Goal status: ONGOING        PLAN: PT FREQUENCY: 1-2x/week   PT DURATION: 8 weeks   PLANNED INTERVENTIONS: Therapeutic exercises, Therapeutic activity, Neuromuscular re-education, Balance training, Gait training, Patient/Family education, Self Care, Joint mobilization, Joint manipulation, Aquatic Therapy, Dry Needling, Electrical stimulation, Spinal manipulation, Spinal mobilization, Cryotherapy, Moist heat, Traction, Manual therapy, and Re-evaluation.   PLAN FOR NEXT SESSION: Progress cervical and lumbar strengthening and stretching exercises     Bradly Chris PT, DPT  03/22/2022, 4:39 PM

## 2022-03-23 LAB — CULTURE, URINE COMPREHENSIVE

## 2022-03-28 ENCOUNTER — Ambulatory Visit: Payer: Medicare Other | Admitting: Physical Therapy

## 2022-04-03 ENCOUNTER — Ambulatory Visit: Payer: Medicare Other | Admitting: Physical Therapy

## 2022-04-05 ENCOUNTER — Ambulatory Visit: Payer: Medicare Other | Attending: Internal Medicine | Admitting: Physical Therapy

## 2022-04-05 ENCOUNTER — Encounter: Payer: Self-pay | Admitting: Physical Therapy

## 2022-04-05 DIAGNOSIS — M5459 Other low back pain: Secondary | ICD-10-CM | POA: Insufficient documentation

## 2022-04-05 DIAGNOSIS — M542 Cervicalgia: Secondary | ICD-10-CM | POA: Diagnosis not present

## 2022-04-05 NOTE — Therapy (Signed)
OUTPATIENT PHYSICAL THERAPY TREATMENT NOTE   Patient Name: Kristi Greer MRN: [id number] DOB:[birth year], 71 y.o., female Today's Date: 04/05/2022  PCP: Dr. Nicki Reaper  REFERRING PROVIDER: Dr. Nicki Reaper   END OF SESSION:   PT End of Session - 04/05/22 1421     Visit Number 4    Number of Visits 16    Date for PT Re-Evaluation 05/10/22    Authorization Type Medicare    Progress Note Due on Visit 10    PT Start Time 3748    PT Stop Time 1500    PT Time Calculation (min) 40 min    Equipment Utilized During Treatment Gait belt    Activity Tolerance Patient tolerated treatment well    Behavior During Therapy WFL for tasks assessed/performed             Past Medical History:  Diagnosis Date   Allergy    Atrial fibrillation ([place])    Cancer ([place])    basal cell   Cardiomyopathy, nonischemic ([place])    Carpal tunnel syndrome    bilateral   Congestive heart failure ([place])    normalization of EF with medical therapy   Dysrhythmia 08/31/2015   long RP tachycardia   Environmental and seasonal allergies    Female cystocele    Hyperlipidemia    Hypertension    Hyperthyroidism    Migraines    Overactive detrusor    Personal history of colonic polyps    PONV (postoperative nausea and vomiting)    SUI (stress urinary incontinence, female)    Uterovaginal prolapse    UTI (urinary tract infection)    Vaginal atrophy    Past Surgical History:  Procedure Laterality Date   ANTERIOR AND POSTERIOR VAGINAL REPAIR W/ SACROSPINOUS LIGAMENT SUSPENSION  10/18/2012   CARDIAC CATHETERIZATION N/A 11/26/2015   Procedure: Right/Left Heart Cath and Coronary Angiography;  Surgeon: Jolaine Artist, MD;  Location: [facility];  Service: Cardiovascular;  Laterality: N/A;   CHOLECYSTECTOMY  2006   COLONOSCOPY WITH PROPOFOL N/A 03/19/2017   Procedure: COLONOSCOPY WITH PROPOFOL;  Surgeon: Lollie Sails, MD;  Location: [facility] ENDOSCOPY;  Service: Endoscopy;  Laterality: N/A;   COLPOPEXY   10/18/2012   CYSTOURETHROSCOPY  10/18/2012   ESOPHAGOGASTRODUODENOSCOPY (EGD) WITH PROPOFOL N/A 10/03/2016   Procedure: ESOPHAGOGASTRODUODENOSCOPY (EGD) WITH PROPOFOL;  Surgeon: Lollie Sails, MD;  Location: [facility] ENDOSCOPY;  Service: Endoscopy;  Laterality: N/A;   EYE SURGERY     cataract extration   FINGER SURGERY     HYSTEROSCOPY  2011, 10/2010   OOPHORECTOMY     REVERSE SHOULDER ARTHROPLASTY Right 06/17/2019   Procedure: REVERSE SHOULDER ARTHROPLASTY;  Surgeon: Corky Mull, MD;  Location: [facility] OR[facility];  Service: Orthopedics;  Laterality: Right;   TONSILLECTOMY     TUBAL LIGATION     VAGINAL HYSTERECTOMY     midurethral sling   Patient Active Problem List   Diagnosis Date Noted   Lumbar spondylosis 03/07/2021   Change in bowel movement 01/30/2021   Stress 09/25/2020   Headache 09/25/2020   Chronic back pain 05/11/2020   History of COVID-19 11/13/2019   Cough 11/13/2019   Nausea 11/06/2019   COVID-19 virus infection 10/30/2019   Status post reverse total shoulder replacement, 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   Pain of left heel 05/03/2019   Immunization counseling 05/03/2019   Right shoulder pain 12/21/2018   Leukocytosis 12/21/2018   Hyperglycemia 12/21/2018   Hyperthyroidism 06/21/2018   Pain of right heel 05/04/2018   Snoring 01/06/2018  Impingement syndrome of shoulder region 09/17/2017   GERD (gastroesophageal reflux disease) 06/24/2017   Bilateral carpal tunnel syndrome 11/29/2016   Carpal tunnel syndrome 11/08/2016   Sinusitis 08/13/2016   Epigastric abdominal pain 05/04/2016   Left sided chest pain 12/05/2015   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[facility])    NSVT (nonsustained ventricular tachycardia) ([facility])    Environmental allergies 39/76/7341   Chronic systolic heart failure ([place]) 09/22/2015   Hypotension 09/22/2015   SVT (supraventricular tachycardia) ([place]) 09/01/2015   Chronic migraine without aura without status migrainosus, not intractable 06/24/2015   Health care  maintenance 04/13/2015   Urinary tract infection without hematuria 08/22/2013   History of migraine headaches 11/11/2012   Hypercholesterolemia 11/11/2012   History of colonic polyps 11/11/2012   Migraines 10/02/2012   Overactive detrusor 10/02/2012   SUI (stress urinary incontinence, female) 10/02/2012   Postop check 10/02/2012   Vaginal prolapse 09/23/2012   Incomplete emptying of bladder 09/23/2012    REFERRING DIAG: M54.2 Other low back Pain                                   M54.59 Cervicalgia   THERAPY DIAG:  Cervicalgia  Other low back pain  Rationale for Evaluation and Treatment Rehabilitation  PERTINENT HISTORY: Per Dr. Bary Leriche note on 02/08/22    HPI With past history of afib, cardiomyopathy, hypertension and hypercholesterolemia.  She comes in today to follow up on these issues as well as for a complete physical exam.  Saw urology 01/26/22 - low back pain and hematuria. Planning for cystoscopy.  Also had f/u with Dr Cruzita Lederer 01/20/22 - f/u thyrotoxicosis.  On methimazole.  Labs checked and recommended for her to decrease methimazole to 2.72m q day.  Recheck TFTs in 6 weeks.  States she is doing relatively well.  Breathing stable. No chest pain.  No acid reflux or abdominal reported. Bowels stable.  Some persistent low back pain.  Notices if stands for a long period of time.  Also concerned regarding her posture.   PRECAUTIONS: None   SUBJECTIVE: Patient had increased left sided low back pain since last session and she feels that this was due to increased activity. She reports that her left hip pain is worse than her left sided low back pain.    PAIN:  Are you having pain? No   OBJECTIVE:   VITALS: BP 115/59 HR 73 SpO2  100    DIAGNOSTIC FINDINGS:  CLINICAL DATA:  Low back pain radiating into the left hip for several weeks. Patient slipped and fell 5 months ago. No previous relevant surgery.   EXAM: MRI LUMBAR SPINE WITHOUT CONTRAST   TECHNIQUE: Multiplanar,  multisequence MR imaging of the lumbar spine was performed. No intravenous contrast was administered.   COMPARISON:  Lumbar spine radiographs 05/11/2020, thoracic spine radiographs 10/23/2014 and abdominopelvic CT 11/14/2015.   FINDINGS: Segmentation: In correlation with prior imaging, there are small ribs at T12 and 5 lumbar type vertebral bodies.   Alignment:  Mild convex left scoliosis.  Normal lateral alignment.   Vertebrae: Interval development of a mild superior endplate compression fracture at T11. This appears healed without residual marrow hyperintensity on inversion recovery imaging. No evidence of acute fracture or pars defect. The visualized sacroiliac joints appear unremarkable.   Conus medullaris: Extends to the L1 level and appears normal.   Paraspinal and other soft tissues: No significant paraspinal findings. Renal parapelvic cysts are present bilaterally.  Disc levels:   No significant disc space findings from T11-12 through L1-2.   L2-3: Chronic loss of disc height with annular disc bulging and endplate osteophytes asymmetric to the right. Mild facet and ligamentous hypertrophy. Stable mild right lateral recess and right foraminal narrowing without nerve root encroachment.   L3-4: Chronic loss of disc height with annular disc bulging and a small disc protrusion in the right subarticular zone. Mild to moderate facet and ligamentous hypertrophy bilaterally. Resulting mild spinal stenosis with asymmetric narrowing of the right lateral recess and possible right L4 nerve root encroachment. There is also mild left greater than right foraminal narrowing.   L4-5: Preserved disc height. Mild disc bulging, facet and ligamentous hypertrophy. Mild asymmetric left lateral recess and left foraminal narrowing without definite nerve root encroachment.   L5-S1: Preserved disc height. Mild disc bulging and facet hypertrophy. No spinal stenosis or nerve root  encroachment.   IMPRESSION: 1. No acute findings or explanation for the patient's symptoms identified. 2. Chronic spondylosis at L2-3 and L3-4, similar to previous CT. No high-grade spinal stenosis or clear explanation for left radicular symptoms identified. There is mild left foraminal narrowing at L3-4. 3. Healed mild superior endplate compression deformity at T11. No acute osseous findings.     Electronically Signed   By: Richardean Sale M.D.   On: 08/10/2021 14:01   PATIENT SURVEYS:  FOTO 69/100 with target of 73    SCREENING FOR RED FLAGS: Bowel or bladder incontinence: No Spinal tumors: No Cauda equina syndrome: No Compression fracture: No Abdominal aneurysm: No   COGNITION:           Overall cognitive status: Within functional limits for tasks assessed                          SENSATION: WFL   MUSCLE LENGTH: Hamstrings: Right 80 deg; Left 80 deg Thomas test: Bilateral    POSTURE: rounded shoulders, forward head, and increased thoracic kyphosis   PALPATION: Left glute med TTP along with L3-L5 central spinous process    CERVICAL ROM:    Cervical.                                                            AROM   PROM          NORM               flex:    50            50                      50                     ext:    45           45*                 60            AROM R/L      PROM                          NORM     SB:  45/45         40/30                                  45  rot:          70/70        60/50                                 70-90                                  R      L    Normal   - shoulder flx     180 180          180  - shoulder abd    180 180         180  - shoulder ext     60    60             60  - combined mvt      (touch pt)         - ER       Occiput  Occiput  Occiput         - IR            T7        T7            T7 ER @ 90 deg abd   110 110            90 IR @ 90 deg abd    70   70              70        STRENGTH    Strength R/L 5/5 Shoulder flexion (anterior deltoid/pec major/coracobrachialis, axillary n. (C5/6) and musculocutaneous n. (C5-7)) 5/5 Shoulder abduction (deltoid/supraspinatus, axillary/suprascapular n, C5) 4/4 Shoulder external rotation (infraspinatus/teres minor) 4/4 Shoulder internal rotation (subcapularis/lats/pec major) 4/4 Shoulder extension (posterior deltoid, lats, teres major, axillary/thoracodorsal n.) 3+/3+ Shoulder horizontal abduction 3+/3+     Lower Trap   5/5 Elbow flexion (biceps brachii, brachialis, brachioradialis, musculoskeletal n, C5/6) 5/5 Elbow extension (triceps, radial n, C7) 5/5 Wrist Extension (C6/7) 5/5 Wrist Flexion (C6/7) 5/5 Finger adduction (interossei, ulnar n, T1)   Cervical Isometrics: NT    LUMBAR ROM:    Active  A/PROM  eval  Flexion 85%*  Extension 100%*  Right lateral flexion 100%*  Left lateral flexion 100%*  Right rotation 100%*  Left rotation 100*   (Blank rows = not tested)     * = painful    LOWER EXTREMITY ROM:            Active  Right 03/14/2022 Left 03/14/2022  Hip flexion 120 120  Hip extension 30 30  Hip abduction 45 45  Hip adduction 30 30  Hip internal rotation 45 45  Hip external rotation 45 45  Knee flexion 135 135  Knee extension 0 0  Ankle dorsiflexion 20 20  Ankle plantarflexion 50 50  Ankle inversion 35 35  Ankle eversion 15 15   (Blank rows = not tested)        LOWER EXTREMITY MMT:     MMT Right eval Left eval  Hip flexion 5 5  Hip extension 4 4  Hip abduction 4- 4-  Hip adduction 4- 4-  Hip internal rotation 4+ 4+  Hip external rotation 4+ 4+  Knee flexion 4+ 4+  Knee extension 5 5  Ankle dorsiflexion 5 5  Ankle plantarflexion      Ankle inversion      Ankle eversion       (Blank rows = not tested)   LUMBAR SPECIAL TESTS:  FABER test: Negative  FADIR: Negative    FUNCTIONAL TESTS:  None performed    GAIT: Distance walked: 40 ft  Assistive device utilized:  None Level of assistance: Complete Independence Comments: Forward head and rounded shoulders, increased forward flexion       TODAY'S TREATMENT   04/05/22  UBE Seat and Arms 6 with resistance 2- 5 min Lower Trunk Rotations 3 x 10  Supine Bridges with Leg Extension 3 x 10   Double Bent Trunk Rotation for lower abdominals 3 x 10   Upper Trunk Rotation with clearing shoulder blades 1 x 10               -Pt instructed to stop keeping hands behind head  Side Lying Hip Abduction 3 x 10  -Audible snapping on left hip when performing hip abduction   ER Derotation test of left hip: Negative   Pain experienced with resisted left hip abduction   Explanation about snapping hip versus trochanteric bursitis   03/22/22  UBE level 2 resistance with seat at 6   Seated Assisted Cervical Rotation with Towel 2 x 10  -min VC for sequence of exercise   Dead Bugs 3 x 10  -min VC for sequencing exercise   Lower Trunk Rotation 3 x 10   Figure 4 Bridges 3 x 10   Single Leg Stance 5 x 10 sec   Heel Raises    03/16/22:  THEREX:  Cervical.                                                            AROM   PROM          NORM               flex:    50            50                      50                     ext:    45           45*                 60            AROM R/L      PROM                          NORM     SB:         45/45         40/30  45  rot:          70/70        60/50                                 70-90     Upper Trap Stretch 4 x 60 sec  Neck Extension Stretch 2 x 60 sec  Standing Hip Abduction 2 x 10  -min VC to maintain upright posture and stop lateral lean  Supine Bridges 3 x 10 Supine Double Bent Leg Lifts 3 x 10    Initial  Lower trunk rotations 3 x 10  Butterfly with forward flexion 2 x 60 sec  Seated HS Stretch 2 x 60 sec  Seated Hip ER Stretch 2 x 60 sec  Figure 4 Stretch 2 x 60 sec with strap      PATIENT EDUCATION:   Education details: form and technique for appropriate exercise and explanation about plan of care  Person educated: Patient Education method: Explanation, Demonstration, Verbal cues, and Handouts Education comprehension: verbalized understanding, returned demonstration, and verbal cues required     HOME EXERCISE PROGRAM: Access Code: [email or web address] URL: [email or web address] Date: 04/05/2022 Prepared by: Bradly Chris  Exercises - Supine Lower Trunk Rotation  - 1 x daily - 7 x weekly - 3 sets - 10 reps - Seated Hamstring Stretch  - 1 x daily - 7 x weekly - 1 sets - 3 reps - 60 hold - Seated Hip External Rotation Stretch  - 1 x daily - 7 x weekly - 1 sets - 3 reps - 60 hold - Bound Angle Hands Forward   - 1 x daily - 7 x weekly - 1 sets - 3 reps - 60 hold - Cervical Extension Stretch  - 1 x daily - 7 x weekly - 1 sets - 3 reps - 60 hold - Seated Assisted Cervical Rotation with Towel  - 1 x daily - 7 x weekly - 1 sets - 10 reps - Standing Heel Raise  - 1 x daily - 3 x weekly - 3 sets - 10 reps - Supine Bridge with Leg Extension  - 1 x daily - 3 x weekly - 3 sets - 10 reps - Sidelying Hip Abduction  - 1 x daily - 3 x weekly - 3 sets - 10 reps             -Double Bent Trunk Rotation for lower abdominals 3 x 10 x 3 days per week             - Upper Trunk Rotation with clearing shoulder blades 1 x 10 x 3 days per week    ASSESSMENT:   CLINICAL IMPRESSION: Pt demonstrates signs and symptoms of left sided trochanteric bursitis and snapping hip syndrome with focal pain of left trochanteric region along with pain with resisted left hip abduction along with glute med weakness and audible snapping sound with left hip abduction and adduction. She does show improvement in abdominal strength with ability to perform lower and upper abdominal strengthening exercises of increased difficulty and she exhibits improved spinal mobility. She will continue to benefit skilled PT to improve cervical and  lumbar ROM and hip strength and flexibility to bend and lift without experiencing excess pain to garden recreationally and maintain her pool.   OBJECTIVE IMPAIRMENTS Abnormal gait, difficulty walking, decreased ROM, decreased strength, impaired flexibility, and pain.    ACTIVITY LIMITATIONS carrying, lifting,  bending, standing, sleeping, stairs, and locomotion level   PARTICIPATION LIMITATIONS: cleaning, laundry, shopping, community activity, and yard work   PERSONAL FACTORS Age, Past/current experiences, Sex, Time since onset of injury/illness/exacerbation, and 1-2 comorbidities: chronic low back pain, afib,  are also affecting patient's functional outcome.    REHAB POTENTIAL: Good   CLINICAL DECISION MAKING: Stable/uncomplicated   EVALUATION COMPLEXITY: Low     GOALS: Goals reviewed with patient? No   SHORT TERM GOALS: Target date: 03/29/2022   Pt will be independent with HEP in order to improve strength and balance in order to decrease fall risk and improve function at home and work. Baseline: NT  Goal status: ongoing    2.  Patient will be able to achieve 100% lumbar flexion as evidence of improved symptom response to lumbar movement and improved hamstring length.  Baseline: 85%, 04/05/22: 95% Goal status: Partially Met    3.  Patient will show understanding of posture by demonstrating accurate seated posture mechanics.  Baseline: Forward Head and Rounded Shoulders  Goal status: ONGOING      LONG TERM GOALS: Target date: 05/10/2022   Patient will have improved function and activity level as evidenced by an increase in FOTO score by 10 points or more.  Baseline: 69/100 with target score 73 Goal status: ONGOING    2.  Patient will improve cervical extension AROM to 60 as evidence of improved posture and cervical muscle tension.  Baseline: 45 Goal status: ONGOING    3.  Patient will improve hip and parascapular strength to better support the surrounding structures in the  spine in order to decrease symptoms.  Baseline: Hip Abd R/L 4-/4- Add R/L 4-/4- Ext R/L 4/4, Mid Trap 3+/3+, Lower Trap 3+/3+, Shoulder Ext 3+/3+ Goal status: ONGOING        PLAN: PT FREQUENCY: 1-2x/week   PT DURATION: 8 weeks   PLANNED INTERVENTIONS: Therapeutic exercises, Therapeutic activity, Neuromuscular re-education, Balance training, Gait training, Patient/Family education, Self Care, Joint mobilization, Joint manipulation, Aquatic Therapy, Dry Needling, Electrical stimulation, Spinal manipulation, Spinal mobilization, Cryotherapy, Moist heat, Traction, Manual therapy, and Re-evaluation.   PLAN FOR NEXT SESSION: Reassess goals and access IT band length with Ober's test. Reprint HEP with correct exercises.  Progress cervical and lumbar strengthening and stretching exercises     Bradly Chris PT, DPT  04/05/2022, 2:23 PM

## 2022-04-10 ENCOUNTER — Other Ambulatory Visit: Payer: Medicare Other | Admitting: Urology

## 2022-04-12 ENCOUNTER — Encounter: Payer: Self-pay | Admitting: Urology

## 2022-04-12 ENCOUNTER — Encounter: Payer: Self-pay | Admitting: Physical Therapy

## 2022-04-12 ENCOUNTER — Ambulatory Visit: Payer: Medicare Other | Admitting: Physical Therapy

## 2022-04-12 ENCOUNTER — Other Ambulatory Visit: Payer: Self-pay | Admitting: Urology

## 2022-04-12 ENCOUNTER — Ambulatory Visit (INDEPENDENT_AMBULATORY_CARE_PROVIDER_SITE_OTHER): Payer: Medicare Other | Admitting: Urology

## 2022-04-12 ENCOUNTER — Telehealth: Payer: Self-pay

## 2022-04-12 VITALS — BP 118/72 | HR 67 | Ht 61.0 in | Wt 158.0 lb

## 2022-04-12 DIAGNOSIS — N329 Bladder disorder, unspecified: Secondary | ICD-10-CM

## 2022-04-12 DIAGNOSIS — N3289 Other specified disorders of bladder: Secondary | ICD-10-CM

## 2022-04-12 DIAGNOSIS — N210 Calculus in bladder: Secondary | ICD-10-CM

## 2022-04-12 DIAGNOSIS — Z01818 Encounter for other preprocedural examination: Secondary | ICD-10-CM

## 2022-04-12 DIAGNOSIS — M5459 Other low back pain: Secondary | ICD-10-CM

## 2022-04-12 DIAGNOSIS — M542 Cervicalgia: Secondary | ICD-10-CM | POA: Diagnosis not present

## 2022-04-12 NOTE — Telephone Encounter (Signed)
I spoke with Kristi Greer. We have discussed possible surgery dates and Friday August 25th, 2023 was agreed upon by all parties. Patient given information about surgery date, what to expect pre-operatively and post operatively.  We discussed that a Pre-Admission Testing office will be calling to set up the pre-op visit that will take place prior to surgery, and that these appointments are typically done over the phone with a Pre-Admissions RN.  Informed patient that our office will communicate any additional care to be provided after surgery. Patients questions or concerns were discussed during our call. Advised to call our office should there be any additional information, questions or concerns that arise. Patient verbalized understanding.

## 2022-04-12 NOTE — Progress Notes (Signed)
Surgical Physician Order Form [facility] [place]  * Scheduling expectation : Next Available  *Length of Case: 30 minutes  *Clearance needed: no  *Anticoagulation Instructions: Hold all anticoagulants  *Aspirin Instructions: Hold Aspirin  *Post-op visit Date/Instructions:   We will call with pathology  *Diagnosis: Bladder Lesion and bladder stone  *Procedure:     Cysto Bladder Biopsy ([id number])   Additional orders: N/A  -Admit type: OUTpatient  -Anesthesia: General  -VTE Prophylaxis Standing Order SCD's       Other:   -Standing Lab Orders Per Anesthesia    Lab other: UA&Urine Culture  -Standing Test orders EKG/Chest x-ray per Anesthesia       Test other:   - Medications:  Cipro '400mg'$  IV  -Other orders:  N/A

## 2022-04-12 NOTE — Patient Instructions (Signed)
Bladder Biopsy A bladder biopsy is a procedure to remove a small sample of tissue from the bladder. The tissue is checked under a microscope to diagnose or rule out bladder cancer. During a bladder biopsy, your health care provider may guide a long, thin instrument with a lighted camera (cystoscope) through your urethra and into your bladder. This lets your health care provider see the lining of your urethra and bladder and take a tissue sample. If your ureters need to be checked, a longer tube (ureteroscope) may be used instead. Tell your health care provider about: Any allergies you have. All medicines you are taking, including vitamins, herbs, eye drops, creams, and over-the-counter medicines. Any problems you or family members have had with anesthetic medicines. Any bleeding problems you have. Any surgeries you have had. Any medical conditions you have. Whether you are pregnant or may be pregnant. What are the risks? Generally, this is a safe procedure. However, problems may occur, including: Bleeding. Infection. Allergic reactions to medicines. Damage to nearby structures or organs. These may include the urethra, bladder, and ureters. Pain in the abdomen and pain when you urinate. Narrowing of the urethra from scar tissue. Trouble urinating from swelling. What happens before the procedure? Medicines Ask your health care provider about: Changing or stopping your regular medicines. This is especially important if you are taking diabetes medicines or blood thinners. Taking medicines such as aspirin and ibuprofen. These medicines can thin your blood. Do not take these medicines unless your health care provider tells you to take them. Taking over-the-counter medicines, vitamins, herbs, and supplements. Surgery safety Ask your health care provider: How your surgery site will be marked. What steps will be taken to help prevent infection. These steps may include: Removing hair at the  surgery site. Washing skin with a germ-killing soap. Receiving antibiotic medicine. General instructions Follow instructions from your health care provider about eating and drinking restrictions. You may be asked to drink plenty of fluids. You may be asked to urinate right before the procedure. Your urine may be tested for infection. If you will be going home right after the procedure, plan to have a responsible adult: Take you home from the hospital or clinic. You will not be allowed to drive. Care for you for the time you are told. What happens during the procedure?  An IV may be inserted into one of your veins. You may be given one or more of the following: A medicine to help you relax (sedative). A medicine to numb the opening of the urethra (local anesthetic). A medicine to make you fall asleep (general anesthetic). You will lie on your back with your knees bent and spread apart. The cystoscope or ureteroscope will be put into your urethra and guided into your bladder or ureters. Your bladder may be filled with germ-free (sterile) water. This will help your health care provider see the wall or lining of your bladder. Small instruments will be put through the scope to take a tissue sample to look at under a microscope. The procedure may vary among health care providers and hospitals. What happens after the procedure? Your blood pressure, heart rate, breathing rate, and blood oxygen level will be monitored until you leave the hospital or clinic. You may be asked to empty your bladder, or it may be emptied for you. If you were given a sedative during the procedure, it can affect you for several hours. Do not drive or operate machinery until your health care provider says that  it is safe. Summary A bladder biopsy is a procedure to remove a small tissue sample from the bladder. A bladder biopsy can be done to diagnose or rule out bladder cancer. Follow instructions from your health care  provider about what you can eat or drink and about any changes to your medicines. Plan to have a responsible adult take you home and care for you as told. This information is not intended to replace advice given to you by your health care provider. Make sure you discuss any questions you have with your health care provider. Document Revised: 07/24/2021 Document Reviewed: 07/24/2021 Elsevier Patient Education  [place].
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
## 2022-04-12 NOTE — Progress Notes (Signed)
[facility] Posting Form   Surgery Date/Time: Date: 04/21/2022  Surgeon: Dr. [name], MD   Surgery Location: Day Surgery  Inpt ( No  )   Outpt (Yes)   Obs ( No  )   Diagnosis: Bladder Lesion N32.9, Bladder Stone N21.0  -CPT: 52204  Surgery: Cystoscopy with Bladder Biopsy   Stop Anticoagulations: Yes and hold ASA  Cardiac/Medical/Pulmonary Clearance needed: no  *Orders entered into EPIC  Date: 04/12/22   *Case booked in [place]  Date: 04/12/22  *Notified pt of Surgery: Date: 04/12/22  PRE-OP UA & CX: yes, obtained in clinic today  *Placed into Prior Authorization Work Que Date: 04/12/22   Assistant/laser/rep:No

## 2022-04-12 NOTE — Progress Notes (Signed)
   04/12/2022 1:02 PM   Kristi Greer May 14, 1951 [id number]  Reason for visit: Follow up bladder stones, abnormal cystoscopy  HPI: 71 year old female with large cystocele planning to undergo prolapse repair with Dr. Louis Meckel later this year.  She has been followed by Dr. Matilde Sprang here in our clinic, and was recently found to have numerous stones at the base of the bladder as well as some erythema and edema that he felt warranted biopsy.  She was referred to me for consideration of stone removal and bladder biopsy/fulguration.  I personally viewed and interpreted the CT Urogram dated 02/10/2022 showing numerous small bladder stones, no hydronephrosis.  We reviewed the risks and benefits of cystoscopy, stone evacuation/cystolitholapaxy and bladder biopsy and fulguration.  Very low risk of bleeding, infection, or need for additional procedures.  We discussed possible risk of recurrence of stones if persistent incomplete emptying.  We also discussed possible risk for additional procedures pending biopsy results, and briefly reviewed the nonmuscle invasive versus muscle invasive bladder cancer pathways.  Schedule cystoscopy, stone evacuation, bladder biopsy and fulguration   Billey Co, MD  [facility] [address] [place], [place] [address] [phone]

## 2022-04-13 ENCOUNTER — Encounter: Payer: Self-pay | Admitting: Internal Medicine

## 2022-04-13 DIAGNOSIS — N200 Calculus of kidney: Secondary | ICD-10-CM | POA: Insufficient documentation

## 2022-04-13 LAB — URINALYSIS, COMPLETE
Bilirubin, UA: NEGATIVE
Glucose, UA: NEGATIVE
Ketones, UA: NEGATIVE
Nitrite, UA: NEGATIVE
Specific Gravity, UA: 1.02 (ref 1.005–1.030)
Urobilinogen, Ur: 0.2 mg/dL (ref 0.2–1.0)
pH, UA: 6.5 (ref 5.0–7.5)

## 2022-04-13 LAB — MICROSCOPIC EXAMINATION: WBC, UA: >30 /hpf — AB (ref 0–5)

## 2022-04-15 ENCOUNTER — Other Ambulatory Visit: Payer: Self-pay | Admitting: Internal Medicine

## 2022-04-16 LAB — CULTURE, URINE COMPREHENSIVE

## 2022-04-17 ENCOUNTER — Other Ambulatory Visit: Payer: Self-pay

## 2022-04-17 ENCOUNTER — Telehealth: Payer: Self-pay

## 2022-04-17 MED ORDER — SUMATRIPTAN SUCCINATE 100 MG PO TABS: ORAL_TABLET | ORAL | 0 refills | Status: DC

## 2022-04-17 NOTE — Telephone Encounter (Signed)
Patient called and notified that refill was sent.

## 2022-04-17 NOTE — Telephone Encounter (Signed)
Patient states she has one tablet left and needs a refill for her SUMAtriptan (IMITREX) 100 MG tablet.    *Patient states her preferred pharmacy is [facility] in [place].

## 2022-04-18 ENCOUNTER — Telehealth: Payer: Self-pay

## 2022-04-18 ENCOUNTER — Encounter
Admission: RE | Admit: 2022-04-18 | Discharge: 2022-04-18 | Disposition: A | Discharge status: Home / Self Care | Payer: Medicare Other | Source: Ambulatory Visit | Attending: Urology | Admitting: Urology

## 2022-04-18 ENCOUNTER — Encounter: Payer: Self-pay | Admitting: Urology

## 2022-04-18 ENCOUNTER — Other Ambulatory Visit: Payer: Self-pay

## 2022-04-18 ENCOUNTER — Ambulatory Visit: Payer: Medicare Other | Admitting: Physical Therapy

## 2022-04-18 DIAGNOSIS — I5022 Chronic systolic (congestive) heart failure: Secondary | ICD-10-CM

## 2022-04-18 DIAGNOSIS — Z01812 Encounter for preprocedural laboratory examination: Secondary | ICD-10-CM

## 2022-04-18 HISTORY — DX: Personal history of urinary calculi: Z87.442

## 2022-04-18 HISTORY — DX: Pneumonia, unspecified organism: J18.9

## 2022-04-18 HISTORY — DX: Anemia, unspecified: D64.9

## 2022-04-18 MED ORDER — AMOXICILLIN 500 MG PO CAPS: 500.0000 mg | ORAL_CAPSULE | Freq: Two times a day (BID) | ORAL | 0 refills | Status: DC

## 2022-04-18 NOTE — Telephone Encounter (Signed)
-----   Message from Billey Co, MD sent at 04/18/2022  8:18 AM EDT ----- Please send in amoxicillin 500 mg twice daily x5 days to sterilize urine prior to upcoming surgery  Thanks [name], MD 04/18/2022

## 2022-04-18 NOTE — Telephone Encounter (Signed)
Preoperative team, please contact this patient and set up a phone call appointment for further cardiac evaluation.  Thank you for your help.  Jossie Ng. [place] Sorn NP-C    04/18/2022, 4:47 PM [facility] [address] Office [phone] Fax [phone]

## 2022-04-18 NOTE — Telephone Encounter (Signed)
1. What type of surgery is being performed?  CYSTOSCOPY WITH BLADDER BIOPSY   2. When is this surgery scheduled?  04/21/2022     3. Type of clearance being requested (medical, pharmacy, both).  MEDICAL     4. Are there any medications that need to be held prior to surgery?  NONE   5. Practice name and name of physician performing surgery?  Performing surgeon: Dr. [name], MD  Requesting clearance: Honor Loh, FNP-C       6. Anesthesia type (none, local, MAC, general)?  GENERAL   7. What is the office phone and fax number?    Phone: [phone]  Fax: [phone]

## 2022-04-18 NOTE — Telephone Encounter (Signed)
Spoke with pt. Pt. Advised of results and verbalized understanding. I have sent medication in to her pharmacy as requested.

## 2022-04-18 NOTE — Telephone Encounter (Signed)
  Patient Consent for Virtual Visit        Kristi Greer has provided verbal consent on 04/18/2022 for a virtual visit (video or telephone).   CONSENT FOR VIRTUAL VISIT FOR:  Kristi Greer  By participating in this virtual visit I agree to the following:  I hereby voluntarily request, consent and authorize [facility] and its employed or contracted physicians, physician assistants, nurse practitioners or other licensed health care professionals (the Practitioner), to provide me with telemedicine health care services (the "Services") as deemed necessary by the treating Practitioner. I acknowledge and consent to receive the Services by the Practitioner via telemedicine. I understand that the telemedicine visit will involve communicating with the Practitioner through live audiovisual communication technology and the disclosure of certain medical information by electronic transmission. I acknowledge that I have been given the opportunity to request an in-person assessment or other available alternative prior to the telemedicine visit and am voluntarily participating in the telemedicine visit.  I understand that I have the right to withhold or withdraw my consent to the use of telemedicine in the course of my care at any time, without affecting my right to future care or treatment, and that the Practitioner or I may terminate the telemedicine visit at any time. I understand that I have the right to inspect all information obtained and/or recorded in the course of the telemedicine visit and may receive copies of available information for a reasonable fee.  I understand that some of the potential risks of receiving the Services via telemedicine include:  Delay or interruption in medical evaluation due to technological equipment failure or disruption; Information transmitted may not be sufficient (e.g. poor resolution of images) to allow for appropriate medical decision making by the  Practitioner; and/or  In rare instances, security protocols could fail, causing a breach of personal health information.  Furthermore, I acknowledge that it is my responsibility to provide information about my medical history, conditions and care that is complete and accurate to the best of my ability. I acknowledge that Practitioner's advice, recommendations, and/or decision may be based on factors not within their control, such as incomplete or inaccurate data provided by me or distortions of diagnostic images or specimens that may result from electronic transmissions. I understand that the practice of medicine is not an exact science and that Practitioner makes no warranties or guarantees regarding treatment outcomes. I acknowledge that a copy of this consent can be made available to me via my patient portal ([facility]), or I can request a printed copy by calling the office of [facility].    I understand that my insurance will be billed for this visit.   I have read or had this consent read to me. I understand the contents of this consent, which adequately explains the benefits and risks of the Services being provided via telemedicine.  I have been provided ample opportunity to ask questions regarding this consent and the Services and have had my questions answered to my satisfaction. I give my informed consent for the services to be provided through the use of telemedicine in my medical care

## 2022-04-18 NOTE — Telephone Encounter (Signed)
Spoke with patient who is agreeable to do a tele visit on 8/24 at 3 pm. Med rec and consent has been done

## 2022-04-18 NOTE — Patient Instructions (Addendum)
Your procedure is scheduled on: 04/21/22 - Friday Report to the Registration Desk on the 1st floor of the [facility]. To find out your arrival time, please call [phone] between 1PM - 3PM on: 04/20/22 - Thursday If your arrival time is 6:00 am, do not arrive prior to that time as the [facility] entrance doors do not open until 6:00 am.  REMEMBER: Instructions that are not followed completely may result in serious medical risk, up to and including death; or upon the discretion of your surgeon and anesthesiologist your surgery may need to be rescheduled.  Do not eat food after midnight the night before surgery.  No gum chewing, lozengers or hard candies.  TAKE THESE MEDICATIONS THE MORNING OF SURGERY WITH A SIP OF WATER:  - acetaminophen (TYLENOL) 650 MG CR  - amoxicillin (AMOXIL) - carvedilol (COREG)  - methimazole (TAPAZOLE) - trimethoprim (TRIMPEX)   One week prior to surgery: Stop Anti-inflammatories (NSAIDS) such as Advil, Aleve, Ibuprofen, Motrin, Naproxen, Naprosyn and Aspirin based products such as Excedrin, Goodys Powder, BC Powder.  Stop ANY OVER THE COUNTER supplements until after surgery.  You may however, continue to take Tylenol if needed for pain up until the day of surgery.  No Alcohol for 24 hours before or after surgery.  No Smoking including e-cigarettes for 24 hours prior to surgery.  No chewable tobacco products for at least 6 hours prior to surgery.  No nicotine patches on the day of surgery.  Do not use any "recreational" drugs for at least a week prior to your surgery.  Please be advised that the combination of cocaine and anesthesia may have negative outcomes, up to and including death. If you test positive for cocaine, your surgery will be cancelled.  On the morning of surgery brush your teeth with toothpaste and water, you may rinse your mouth with mouthwash if you wish. Do not swallow any toothpaste or mouthwash.  Do not wear jewelry,  make-up, hairpins, clips or nail polish.  Do not wear lotions, powders, or perfumes.   Do not shave body from the neck down 48 hours prior to surgery just in case you cut yourself which could leave a site for infection.  Also, freshly shaved skin may become irritated if using the CHG soap.  Contact lenses, hearing aids and dentures may not be worn into surgery.  Do not bring valuables to the hospital. [facility] is not responsible for any missing/lost belongings or valuables.   Notify your doctor if there is any change in your medical condition (cold, fever, infection).  Wear comfortable clothing (specific to your surgery type) to the hospital.  After surgery, you can help prevent lung complications by doing breathing exercises.  Take deep breaths and cough every 1-2 hours. Your doctor may order a device called an Incentive Spirometer to help you take deep breaths. When coughing or sneezing, hold a pillow firmly against your incision with both hands. This is called "splinting." Doing this helps protect your incision. It also decreases belly discomfort.  If you are being admitted to the hospital overnight, leave your suitcase in the car. After surgery it may be brought to your room.  If you are being discharged the day of surgery, you will not be allowed to drive home. You will need a responsible adult (18 years or older) to drive you home and stay with you that night.   If you are taking public transportation, you will need to have a responsible adult (18 years  or older) with you. Please confirm with your physician that it is acceptable to use public transportation.   Please call the [facility] at [phone] if you have any questions about these instructions.  Surgery Visitation Policy:  Patients undergoing a surgery or procedure may have two family members or support persons with them as long as the person is not COVID-19 positive or experiencing its symptoms.    Inpatient Visitation:    Visiting hours are 7 a.m. to 8 p.m. Up to four visitors are allowed at one time in a patient room, including children. The visitors may rotate out with other people during the day. One designated support person (adult) may remain overnight.

## 2022-04-19 ENCOUNTER — Encounter: Payer: Self-pay | Admitting: Urology

## 2022-04-19 NOTE — Progress Notes (Signed)
Perioperative Services  Pre-Admission/Anesthesia Testing Clinical Review  Date: 04/20/22  Patient Demographics:  Name: Kristi Greer DOB:   09/30/1950 MRN:   [id number]  Planned Surgical Procedure(s):    Case: [id number] Date/Time: 04/21/22 1252   Procedure: CYSTOSCOPY WITH BLADDER BIOPSY   Anesthesia type: General   Pre-op diagnosis: Bladder Lesion and Bladder Stone   Location: [place] [facility] / [facility] FOR ANESTHESIA GROUP   Surgeons: Billey Co, MD   NOTE: Available PAT nursing documentation and vital signs have been reviewed. Clinical nursing staff has updated patient's PMH/PSHx, current medication list, and drug allergies/intolerances to ensure comprehensive history available to assist in medical decision making as it pertains to the aforementioned surgical procedure and anticipated anesthetic course. Extensive review of available clinical information performed. [facility] PMH and PSHx updated with any diagnoses/procedures that  may have been inadvertently omitted during her intake with the pre-admission testing department's nursing staff.  Clinical Discussion:  Kristi Greer is a 71 y.o. female who is submitted for pre-surgical anesthesia review and clearance prior to her undergoing the above procedure. Patient has never been a smoker. Pertinent PMH includes: atrial fibrillation, NICM, CHF, SVT/long RP tachycardia, NSVT,  HTN, HLD, hypothyroidism, GERD (uses CaCo3 tablets PRN), hiatal hernia, anemia, chronic back pain.  Patient is followed by cardiology Rockey Situ, MD). She was last seen in the cardiology clinic on 06/14/2021; notes reviewed.  At the time of her clinic visit, patient doing well overall from a cardiovascular perspective.  She denied any episodes of chest pain, shortness of breath, PND, orthopnea, palpitations, significant peripheral edema, vertiginous symptoms, or presyncope/syncope.  Patient with a past medical history significant for cardiovascular  diagnoses.  Patient diagnosed with a nonischemic cardiomyopathy and January 2017.  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  TTE at that time revealed a moderately reduced left ventricular systolic function with an EF of 35-40%.  There were no regional wall motion abnormalities.  There was moderate mitral and mild tricuspid valve regurgitation.  There was no evidence of a significant transvalvular gradient to suggest stenosis.  Patient admitted here at [facility] in January 2017 with acute CHF and SVT.  Patient was given Adenocard, however chemical cardioversion was unsuccessful.  Patient ultimately spontaneously converted without further intervention.    Patient underwent diagnostic RIGHT/LEFT heart catheterization on 09/28/2015.  Study revealed normal coronary anatomy with no evidence of significant obstructive coronary artery disease.  Global hypokinesis was noted.  Patient with well compensated hemodynamics; mean PA = 16 mmHg, PCWP 7 mmHg, PVR 2.0 WU, CO = 4.7 L/min, CI 2.8 L/min/m.  Patient with an episode of documented NSVT captured during admission for acute illness on 11/16/2015.  Episode was felt to be related to her NICM in the setting of increased stressors, diagnosis of influenza A, and hypotension that was treated with a rapid 6 L IV fluid bolus..  Cardiac MRI was performed on 04/27/2016 revealing a normal left ventricular size and mildly decreased systolic function with an EF of 48%.  There was mild diffuse hypokinesis.  No evidence of LGE to suggest an ischemic, infiltrative, or inflammatory cardiomyopathy.  There was trivial mitral and tricuspid valve regurgitation.  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  Most recent TTE was performed on 03/25/2018 revealing a low normal left ventricular systolic function with an EF of 50-55%.  There were no regional wall motion abnormalities. Diastolic Doppler  parameters consistent with pseudonormalization (G2DD).  There was trivial  pulmonary valve regurgitation.  Study revealed normal transvalvular gradients suggesting no evidence of valvular stenosis.  Blood pressure well controlled at 110/60 on currently prescribed beta-blocker (carvedilol) and diuretic (furosemide) therapies.  Patient is on rosuvastatin for her HLD diagnosis and ASCVD prevention.  She is not diabetic.  Patient does not have an OSAH diagnosis.  Patient reported to be maintaining an active lifestyle and participating in such activities as gardening and golf.  Patient has no formal exercise regimen.  With that being said, per the DASI, patient maintains adequate functional capacity and is able to achieve at least 4 METS of activity without experiencing any type of angina/anginal equivalent symptoms.  No changes were made to her medication regimen.  Patient to follow-up with outpatient cardiology in 1 year or sooner if needed.  Kristi Greer is scheduled for a CYSTOSCOPY WITH BLADDER BIOPSY on 04/21/2022 with Dr. [name], MD. Given patient's past medical history significant for cardiovascular diagnoses, presurgical cardiac clearance was sought by the PAT team.  Per cardiology, "[name]'s perioperative risk of a major cardiac event is 6.6% according to the RCRI.  Her functional capacity is excellent at 6.55 METs according to the DASI. Therefore, according to ACC/AHA guidelines, no further cardiovascular testing needed.  The patient may proceed to surgery at an ACCEPTABLE risk".  In review of her medication reconciliation, the patient is not noted to be taking any type of anticoagulation or antiplatelet therapies that would need to be held during her perioperative course.  Patient reports previous perioperative complications with anesthesia in the past. Patient has a PMH (+) for PONV. Symptoms and history of PONV will be discussed with patient by anesthesia team on the day of her  procedure. Interventions will be ordered as deemed necessary based on patient's individual care needs as determined by anesthesiologist. In review of the available records, it is noted that patient underwent a general anesthetic course here at [facility] in 05/2019 without documented complications.      04/12/2022    1:09 PM 03/20/2022   10:31 AM 02/08/2022    7:27 AM  Vitals with BMI  Height _0  _1  _2   Weight 158 lbs 153 lbs 158 lbs  BMI 29.87 92.42 68.34  Systolic 196 99 222  Diastolic 72 64 68  Pulse 67 69 72    Providers/Specialists:   NOTE: Primary physician provider listed below. Patient may have been seen by APP or partner within same practice.   PROVIDER ROLE / SPECIALTY LAST Ranae Pila, MD Urology (Surgeon) 04/12/2022  Einar Pheasant, MD Primary Care Provider 02/08/2022  Ida Rogue, MD Cardiology 06/14/2021   Allergies:  Patient has no known allergies.  Current Home Medications:   No current facility-administered medications for this encounter.    acetaminophen (TYLENOL) 500 MG tablet   acetaminophen (TYLENOL) 650 MG CR tablet   amoxicillin (AMOXIL) 500 MG capsule   calcium carbonate (OSCAL) 1500 (600 Ca) MG TABS tablet   calcium carbonate (TUMS - DOSED IN MG ELEMENTAL CALCIUM) 500 MG chewable tablet   carvedilol (COREG) 12.5 MG tablet   Cholecalciferol 1000 units capsule   fexofenadine (ALLEGRA) 180 MG tablet   furosemide (LASIX) 20 MG tablet   methimazole (TAPAZOLE) 5 MG tablet   rosuvastatin (CRESTOR) 10 MG tablet   SUMAtriptan (IMITREX) 100 MG tablet   tiZANidine (ZANAFLEX) 2 MG tablet   triamcinolone (NASACORT) 55 MCG/ACT AERO nasal inhaler   trimethoprim (TRIMPEX) 100 MG  tablet   zonisamide (ZONEGRAN) 100 MG capsule   History:   Past Medical History:  Diagnosis Date   Allergy    Anemia    Atrial fibrillation (HCC)    Cardiomyopathy, nonischemic ([place])    a.) felt to be tachycardia  mediated NICM; b.) TTE 09/01/2015: EF 35-40%; c.) [facility] 11/26/2015: ED 30-35%, norm cors, mPA 16, PCWP 7, CO 4.7, CI 2.8; d.) TTE 01/31/2016: EF 40-45%; e.) cMRI 04/27/2016: EF 48%; f.) TTE 12/06/2016: EF 55-60%; g.) TTE 03/25/2018: EF 50-55%   Carpal tunnel syndrome, bilateral    Chronic back pain    Congestive heart failure ([place])    a.) TTE 09/01/2015: EF 35-40%, mod MR; b.) R/LHC 11/26/2015: EF 30-35%; c.) TTE 01/31/2016: EF 40-45%, mild LVH, diffuse HK, triv MR/TR; d.) TTE 12/06/2016: EF 55-60%, mild MAC, G1DD; e.) TTE 03/25/2018: EF 50-55%, triv PR, G2DD.   Diverticulosis    Environmental and seasonal allergies    Female cystocele    GERD (gastroesophageal reflux disease)    Hiatal hernia    History of kidney stones    Hyperlipidemia    Hypertension    Hyperthyroidism    Migraines    NSVT (nonsustained ventricular tachycardia) ([place]) 11/16/2015   a.) felt to be related to NICM in the setting of increased stressors, influenza A, hypotension Tx'd with rapid 6L IVF bolus   Overactive detrusor    Personal history of colonic polyps    Pneumonia    PONV (postoperative nausea and vomiting)    Skin cancer, basal cell    Squamous cell skin cancer    SUI (stress urinary incontinence, female)    SVT (supraventricular tachycardia) ([place]) 08/31/2015   a.) rates as high as 150 bpm; b.) EP feels as if event was "long RP tachycardia"   Uterovaginal prolapse    Vaginal atrophy    Past Surgical History:  Procedure Laterality Date   ANTERIOR AND POSTERIOR VAGINAL REPAIR W/ SACROSPINOUS LIGAMENT SUSPENSION  10/18/2012   CARDIAC CATHETERIZATION N/A 11/26/2015   Procedure: Right/Left Heart Cath and Coronary Angiography;  Surgeon: Jolaine Artist, MD;  Location: [facility];  Service: Cardiovascular;  Laterality: N/A;   CATARACT EXTRACTION Bilateral    CHOLECYSTECTOMY  2006   COLONOSCOPY WITH PROPOFOL N/A 03/19/2017   Procedure: COLONOSCOPY WITH PROPOFOL;  Surgeon: Lollie Sails, MD;   Location: [facility] ENDOSCOPY;  Service: Endoscopy;  Laterality: N/A;   COLPOPEXY  10/18/2012   CYSTOURETHROSCOPY  10/18/2012   ESOPHAGOGASTRODUODENOSCOPY (EGD) WITH PROPOFOL N/A 10/03/2016   Procedure: ESOPHAGOGASTRODUODENOSCOPY (EGD) WITH PROPOFOL;  Surgeon: Lollie Sails, MD;  Location: [facility] ENDOSCOPY;  Service: Endoscopy;  Laterality: N/A;   HYSTEROSCOPY  2011, 10/2010   OOPHORECTOMY     REVERSE SHOULDER ARTHROPLASTY Right 06/17/2019   Procedure: REVERSE SHOULDER ARTHROPLASTY;  Surgeon: Corky Mull, MD;  Location: [facility] ORS;  Service: Orthopedics;  Laterality: Right;   TONSILLECTOMY     TRIGGER FINGER RELEASE     TUBAL LIGATION     VAGINAL HYSTERECTOMY     midurethral sling   Family History  Problem Relation Age of Onset   Hypertension Mother    Stroke Mother    Diabetes Mother    Heart disease Father    Hypercholesterolemia Father    Hypertension Father    Stroke Father    Heart attack Father    Breast cancer Neg Hx    Colon cancer Neg Hx    Thyroid disease Neg Hx    Social History  Tobacco Use   Smoking status: Never    Passive exposure: Never   Smokeless tobacco: Never  Vaping Use   Vaping Use: Never used  Substance Use Topics   Alcohol use: Yes    Alcohol/week: 0.0 standard drinks of alcohol    Comment: Socially   Drug use: Not Currently    Pertinent Clinical Results:  LABS: Labs reviewed: Acceptable for surgery.  Lab Results  Component Value Date   WBC 7.0 04/20/2022   HGB 13.8 04/20/2022   HCT 43.9 04/20/2022   MCV 90.0 04/20/2022   PLT 204 04/20/2022   Lab Results  Component Value Date   NA 141 02/08/2022   K 4.2 02/08/2022   CO2 25 02/08/2022   GLUCOSE 98 02/08/2022   BUN 19 02/08/2022   CREATININE 0.79 02/08/2022   CALCIUM 9.6 02/08/2022   GFRNONAA >60 02/01/2021    ECG: Date: 04/20/2022 Time ECG obtained: 1000 AM Rate: 63 bpm Rhythm:  Sinus rhythm with first-degree AV block Axis (leads I and aVF): Normal Intervals: PR 260 ms. QRS  74 ms. QTc 415 ms. ST segment and T wave changes: Nonspecific lateral T wave abnormalities  Comparison: Similar to previous tracing obtained on 06/14/2021   IMAGING / PROCEDURES: CT HEMATURIA WORKUP performed on 02/10/2022 Cystocele small layering bladder calculi likely responsible for the patient's hematuria. No renal or ureteral calculi. No worrisome renal or bladder lesions. Bilateral parapelvic renal cysts. Status post cholecystectomy. No biliary dilatation. Status post hysterectomy. Both ovaries are still present and appear normal. Fatty replacement of the left gluteus medius and minimus muscles likely related to prior trauma or denervation process.   TRANSTHORACIC ECHOCARDIOGRAM performed on 03/25/2018 Low normal left ventricular systolic function with an EF of 50-55% No regional wall motion abnormalities Diastolic Doppler parameters consistent with pseudonormalization (G2DD). Trivial PR No AR, MR, TR Normal gradients; no valvular stenosis No pericardial effusion  MRI CARD MORPHOLOGY WO/W CM performed on 04/27/2016 Normal left ventricular size and thickness and mildly decreased systolic function (LVEF = 48%) with mild diffuse hypokinesis. There is no late gadolinium enhancement. Normal right ventricular size, thickness and systolic function (RVEF = 62%) with no regional wall motion abnormalities. Trivial mitral and tricuspid regurgitation. There is no evidence for ischemic, infiltrative or inflammatory cardiomyopathy. Collectively, this is consistent with non-ischemic cardiomyopathy possibly tachycardia induced.   RIGHT/LEFT HEART CATHETERIZATION AND CORONARY ANGIOGRAPHY performed on 11/26/2015 Moderate left ventricular systolic dysfunction with an EF of 30-35%; suspect tachycardia induced Global hypokinesis Normal coronary anatomy with no evidence of obstructive CAD Well compensated hemodynamics Ao =  104/61 (78) LV = 107/6/14 RA = 4 RV = 28/4/8 PA = 25/8 (16) PCW =  7 Fick cardiac output/index = 4.7/2.8 PVR = 2.0 WU SVR = 1265 FA sat = 99% PA sat = 71%, 74%   Impression and Plan:  Kristi Greer has been referred for pre-anesthesia review and clearance prior to her undergoing the planned anesthetic and procedural courses. Available labs, pertinent testing, and imaging results were personally reviewed by me. This patient has been appropriately cleared by cardiology with an overall ACCEPTABLE risk of significant perioperative cardiovascular complications.  Based on clinical review performed today (04/20/22), barring any significant acute changes in the patient's overall condition, it is anticipated that she will be able to proceed with the planned surgical intervention. Any acute changes in clinical condition may necessitate her procedure being postponed and/or cancelled. Patient will meet with anesthesia team (MD and/or CRNA) on the day of her procedure for  preoperative evaluation/assessment. Questions regarding anesthetic course will be fielded at that time.   Pre-surgical instructions were reviewed with the patient during her PAT appointment and questions were fielded by PAT clinical staff. Patient was advised that if any questions or concerns arise prior to her procedure then she should return a call to PAT and/or her surgeon's office to discuss.  Honor Loh, MSN, APRN, FNP-C, CEN [facility]  Peri-operative Services Nurse Practitioner Phone: [phone] Fax: [phone] 04/20/22 3:35 PM  NOTE: This note has been prepared using Dragon dictation software. Despite my best ability to proofread, there is always the potential that unintentional transcriptional errors may still occur from this process.

## 2022-04-20 ENCOUNTER — Encounter
Admission: RE | Admit: 2022-04-20 | Discharge: 2022-04-20 | Disposition: A | Discharge status: Home / Self Care | Payer: Medicare Other | Source: Ambulatory Visit | Attending: Urology | Admitting: Urology

## 2022-04-20 ENCOUNTER — Ambulatory Visit (INDEPENDENT_AMBULATORY_CARE_PROVIDER_SITE_OTHER): Payer: Medicare Other | Admitting: Physician Assistant

## 2022-04-20 DIAGNOSIS — L570 Actinic keratosis: Secondary | ICD-10-CM | POA: Diagnosis not present

## 2022-04-20 DIAGNOSIS — L578 Other skin changes due to chronic exposure to nonionizing radiation: Secondary | ICD-10-CM | POA: Diagnosis not present

## 2022-04-20 DIAGNOSIS — Z872 Personal history of diseases of the skin and subcutaneous tissue: Secondary | ICD-10-CM | POA: Diagnosis not present

## 2022-04-20 DIAGNOSIS — Z01812 Encounter for preprocedural laboratory examination: Secondary | ICD-10-CM

## 2022-04-20 DIAGNOSIS — Z01818 Encounter for other preprocedural examination: Secondary | ICD-10-CM | POA: Diagnosis not present

## 2022-04-20 DIAGNOSIS — Z859 Personal history of malignant neoplasm, unspecified: Secondary | ICD-10-CM | POA: Diagnosis not present

## 2022-04-20 DIAGNOSIS — Z01810 Encounter for preprocedural cardiovascular examination: Secondary | ICD-10-CM

## 2022-04-20 DIAGNOSIS — D492 Neoplasm of unspecified behavior of bone, soft tissue, and skin: Secondary | ICD-10-CM | POA: Diagnosis not present

## 2022-04-20 DIAGNOSIS — Z86018 Personal history of other benign neoplasm: Secondary | ICD-10-CM | POA: Diagnosis not present

## 2022-04-20 DIAGNOSIS — I5022 Chronic systolic (congestive) heart failure: Secondary | ICD-10-CM | POA: Insufficient documentation

## 2022-04-20 LAB — CBC
HCT: 43.9 % (ref 36.0–46.0)
Hemoglobin: 13.8 g/dL (ref 12.0–15.0)
MCH: 28.3 pg (ref 26.0–34.0)
MCHC: 31.4 g/dL (ref 30.0–36.0)
MCV: 90 fL (ref 80.0–100.0)
Platelets: 204 10*3/uL (ref 150–400)
RBC: 4.88 MIL/uL (ref 3.87–5.11)
RDW: 13.5 % (ref 11.5–15.5)
WBC: 7 10*3/uL (ref 4.0–10.5)
nRBC: 0 % (ref 0.0–0.2)

## 2022-04-20 NOTE — Progress Notes (Signed)
Virtual Visit via Telephone Note   Because of Kristi Greer's co-morbid illnesses, she is at least at moderate risk for complications without adequate follow up.  This format is felt to be most appropriate for this patient at this time.  The patient did not have access to video technology/had technical difficulties with video requiring transitioning to audio format only (telephone).  All issues noted in this document were discussed and addressed.  No physical exam could be performed with this format.  Please refer to the patient's chart for her consent to telehealth for [facility].  Evaluation Performed:  Preoperative cardiovascular risk assessment _____________   Date:  04/20/2022   Patient ID:  Kristi Greer, Kristi Greer 1951-05-01, MRN [id number] Patient Location:  Home Provider location:   Office  Primary Care Provider:  Einar Pheasant, MD Primary Cardiologist:  Kristi Rogue, MD  Chief Complaint / Patient Profile   71 y.o. y/o female with a h/o obesity, migraines, hypertension, nonischemic cardiomyopathy (1/17 EF was 35 to 40% felt to be tachycardia mediated), and nonsustained VT, recovery of cardiac function 49 to 60% April 2018 and 19, nonobstructive coronary disease on cardiac catheterization 10/2015 who is pending cystoscopy with bladder biopsy and presents today for telephonic preoperative cardiovascular risk assessment.  Past Medical History    Past Medical History:  Diagnosis Date   Allergy    Anemia    Atrial fibrillation (HCC)    Cardiomyopathy, nonischemic ([place])    a.) felt to be tachycardia mediated NICM; b.) TTE 09/01/2015: EF 35-40%; c.) [facility] 11/26/2015: ED 30-35%, norm cors, mPA 16, PCWP 7, CO 4.7, CI 2.8; d.) TTE 01/31/2016: EF 40-45%; e.) cMRI 04/27/2016: EF 48%; f.) TTE 12/06/2016: EF 55-60%; g.) TTE 03/25/2018: EF 50-55%   Carpal tunnel syndrome, bilateral    Chronic back pain    Congestive heart failure ([place])    a.) TTE 09/01/2015: EF 35-40%, mod  MR; b.) R/LHC 11/26/2015: EF 30-35%; c.) TTE 01/31/2016: EF 40-45%, mild LVH, diffuse HK, triv MR/TR; d.) TTE 12/06/2016: EF 55-60%, mild MAC, G1DD; e.) TTE 03/25/2018: EF 50-55%, triv PR, G2DD.   Diverticulosis    Environmental and seasonal allergies    Female cystocele    GERD (gastroesophageal reflux disease)    Hiatal hernia    History of kidney stones    Hyperlipidemia    Hypertension    Hyperthyroidism    Migraines    NSVT (nonsustained ventricular tachycardia) ([place]) 11/16/2015   a.) felt to be related to NICM in the setting of increased stressors, influenza A, hypotension Tx'd with rapid 6L IVF bolus   Overactive detrusor    Personal history of colonic polyps    Pneumonia    PONV (postoperative nausea and vomiting)    Skin cancer, basal cell    Squamous cell skin cancer    SUI (stress urinary incontinence, female)    SVT (supraventricular tachycardia) ([place]) 08/31/2015   a.) rates as high as 150 bpm; b.) EP feels as if event was "long RP tachycardia"   Uterovaginal prolapse    Vaginal atrophy    Past Surgical History:  Procedure Laterality Date   ANTERIOR AND POSTERIOR VAGINAL REPAIR W/ SACROSPINOUS LIGAMENT SUSPENSION  10/18/2012   CARDIAC CATHETERIZATION N/A 11/26/2015   Procedure: Right/Left Heart Cath and Coronary Angiography;  Surgeon: Jolaine Artist, MD;  Location: [place] [facility];  Service: Cardiovascular;  Laterality: N/A;   CATARACT EXTRACTION Bilateral    CHOLECYSTECTOMY  2006   COLONOSCOPY WITH PROPOFOL N/A 03/19/2017  Procedure: COLONOSCOPY WITH PROPOFOL;  Surgeon: Lollie Sails, MD;  Location: [facility] ENDOSCOPY;  Service: Endoscopy;  Laterality: N/A;   COLPOPEXY  10/18/2012   CYSTOURETHROSCOPY  10/18/2012   ESOPHAGOGASTRODUODENOSCOPY (EGD) WITH PROPOFOL N/A 10/03/2016   Procedure: ESOPHAGOGASTRODUODENOSCOPY (EGD) WITH PROPOFOL;  Surgeon: Lollie Sails, MD;  Location: [facility] ENDOSCOPY;  Service: Endoscopy;  Laterality: N/A;   HYSTEROSCOPY  2011,  10/2010   OOPHORECTOMY     REVERSE SHOULDER ARTHROPLASTY Right 06/17/2019   Procedure: REVERSE SHOULDER ARTHROPLASTY;  Surgeon: Corky Mull, MD;  Location: [facility] ORS;  Service: Orthopedics;  Laterality: Right;   TONSILLECTOMY     TRIGGER FINGER RELEASE     TUBAL LIGATION     VAGINAL HYSTERECTOMY     midurethral sling    Allergies  No Known Allergies  History of Present Illness    Kristi Greer is a 71 y.o. female who presents via audio/video conferencing for a telehealth visit today.  Pt was last seen in cardiology clinic on 06/14/2021 by Dr. Rockey Situ.  At that time Kristi Greer was doing well .  The patient is now pending procedure as outlined above. Since her last visit, she states that overall things have been pretty good.  She has an annual appointment in October with our office.  She walked the length of a football field and uphill yesterday.  She remains active around her house and in her yard as well as taking care of her pool.  For this reason she scored a 6.55 METS on the DASI.  This exceeds the minimum 4 METS requirement.  She tells me that her grandson will come and help her weed eat if she can get around to it.  No medications are needing to be held which is good because her surgery is tomorrow.   Home Medications    Prior to Admission medications   Medication Sig Start Date End Date Taking? Authorizing Provider  acetaminophen (TYLENOL) 500 MG tablet Take 500-1,000 mg by mouth every 6 (six) hours as needed for moderate pain or headache.    [provider]  acetaminophen (TYLENOL) 650 MG CR tablet Take 650 mg by mouth 2 (two) times daily with a meal.    [provider]  amoxicillin (AMOXIL) 500 MG capsule Take 1 capsule (500 mg total) by mouth 2 (two) times daily. 04/18/22   Billey Co, MD  calcium carbonate (OSCAL) 1500 (600 Ca) MG TABS tablet Take 1,500 mg by mouth daily.     [provider]  calcium carbonate (TUMS - DOSED IN MG  ELEMENTAL CALCIUM) 500 MG chewable tablet Chew 1 tablet by mouth daily as needed for indigestion or heartburn.    [provider]  carvedilol (COREG) 12.5 MG tablet TAKE 1 TABLET (12.5 MG TOTAL) BY MOUTH 2 (TWO) TIMES DAILY WITH A MEAL. 08/30/21   Minna Merritts, MD  Cholecalciferol 1000 units capsule Take 1,000 Units by mouth daily.    [provider]  fexofenadine (ALLEGRA) 180 MG tablet Take 180 mg by mouth at bedtime.     [provider]  furosemide (LASIX) 20 MG tablet TAKE 1 TABLET (20 MG TOTAL) BY MOUTH EVERY OTHER DAY. 02/03/19   Bensimhon, Shaune Pascal, MD  methimazole (TAPAZOLE) 5 MG tablet Take 0.5 tablets (2.5 mg total) by mouth daily. 01/27/22   Philemon Kingdom, MD  rosuvastatin (CRESTOR) 10 MG tablet TAKE 1 TABLET BY MOUTH EVERY DAY 08/31/21   Kristi Pheasant, MD  SUMAtriptan (IMITREX) 100 MG  tablet May repeat in 2 hours if headache persists or recurs. 04/17/22   Kristi Pheasant, MD  tiZANidine (ZANAFLEX) 2 MG tablet Take 1 tablet (2 mg total) by mouth at bedtime as needed for muscle spasms. 02/11/22   Kristi Pheasant, MD  triamcinolone (NASACORT) 55 MCG/ACT AERO nasal inhaler Place 1 spray into the nose daily as needed (allergies).    [provider]  trimethoprim (TRIMPEX) 100 MG tablet Take 1 tablet (100 mg total) by mouth daily. 03/20/22   Bjorn Loser, MD  zonisamide (ZONEGRAN) 100 MG capsule TAKE 1 CAPSULE BY MOUTH EVERY DAY 04/16/22   Kennyth Arnold, FNP    Physical Exam    Vital Signs:  Kristi Greer does not have vital signs available for review today.  Given telephonic nature of communication, physical exam is limited. AAOx3. NAD. Normal affect.  Speech and respirations are unlabored.  Accessory Clinical Findings    None  Assessment & Plan    1.  Preoperative Cardiovascular Risk Assessment:  Ms. Stay perioperative risk of a major cardiac event is 6.6% according to the Revised Cardiac Risk Index (RCRI).  Therefore, she is  at high risk for perioperative complications.   Her functional capacity is excellent at 6.55 METs according to the [place] Activity Status Index (DASI). Recommendations: According to ACC/AHA guidelines, no further cardiovascular testing needed.  The patient may proceed to surgery at acceptable risk.    A copy of this note will be routed to requesting surgeon.  Time:   Today, I have spent 6 minutes with the patient with telehealth technology discussing medical history, symptoms, and management plan.     Elgie Collard, PA-C  04/20/2022, 1:39 PM

## 2022-04-21 ENCOUNTER — Ambulatory Visit: Payer: Medicare Other | Admitting: Urgent Care

## 2022-04-21 ENCOUNTER — Encounter
Admission: RE | Disposition: A | Discharge status: Home / Self Care | Payer: Self-pay | Source: Home / Self Care | Attending: Urology

## 2022-04-21 ENCOUNTER — Other Ambulatory Visit: Payer: Self-pay

## 2022-04-21 ENCOUNTER — Encounter: Payer: Self-pay | Admitting: Urology

## 2022-04-21 ENCOUNTER — Ambulatory Visit
Admission: RE | Admit: 2022-04-21 | Discharge: 2022-04-21 | Disposition: A | Discharge status: Home / Self Care | Payer: Medicare Other | Attending: Urology | Admitting: Urology

## 2022-04-21 DIAGNOSIS — Z8249 Family history of ischemic heart disease and other diseases of the circulatory system: Secondary | ICD-10-CM | POA: Diagnosis not present

## 2022-04-21 DIAGNOSIS — I428 Other cardiomyopathies: Secondary | ICD-10-CM | POA: Diagnosis not present

## 2022-04-21 DIAGNOSIS — I509 Heart failure, unspecified: Secondary | ICD-10-CM | POA: Diagnosis not present

## 2022-04-21 DIAGNOSIS — M549 Dorsalgia, unspecified: Secondary | ICD-10-CM | POA: Diagnosis not present

## 2022-04-21 DIAGNOSIS — K449 Diaphragmatic hernia without obstruction or gangrene: Secondary | ICD-10-CM | POA: Diagnosis not present

## 2022-04-21 DIAGNOSIS — N3030 Trigonitis without hematuria: Secondary | ICD-10-CM | POA: Diagnosis not present

## 2022-04-21 DIAGNOSIS — E039 Hypothyroidism, unspecified: Secondary | ICD-10-CM | POA: Insufficient documentation

## 2022-04-21 DIAGNOSIS — E785 Hyperlipidemia, unspecified: Secondary | ICD-10-CM | POA: Diagnosis not present

## 2022-04-21 DIAGNOSIS — N814 Uterovaginal prolapse, unspecified: Secondary | ICD-10-CM | POA: Diagnosis not present

## 2022-04-21 DIAGNOSIS — Z79899 Other long term (current) drug therapy: Secondary | ICD-10-CM | POA: Diagnosis not present

## 2022-04-21 DIAGNOSIS — I110 Hypertensive heart disease with heart failure: Secondary | ICD-10-CM | POA: Insufficient documentation

## 2022-04-21 DIAGNOSIS — I4891 Unspecified atrial fibrillation: Secondary | ICD-10-CM | POA: Diagnosis not present

## 2022-04-21 DIAGNOSIS — N8110 Cystocele, unspecified: Secondary | ICD-10-CM | POA: Diagnosis not present

## 2022-04-21 DIAGNOSIS — N329 Bladder disorder, unspecified: Secondary | ICD-10-CM | POA: Diagnosis not present

## 2022-04-21 DIAGNOSIS — K219 Gastro-esophageal reflux disease without esophagitis: Secondary | ICD-10-CM | POA: Insufficient documentation

## 2022-04-21 DIAGNOSIS — N210 Calculus in bladder: Secondary | ICD-10-CM | POA: Insufficient documentation

## 2022-04-21 DIAGNOSIS — G8929 Other chronic pain: Secondary | ICD-10-CM | POA: Insufficient documentation

## 2022-04-21 DIAGNOSIS — Z87440 Personal history of urinary (tract) infections: Secondary | ICD-10-CM | POA: Diagnosis not present

## 2022-04-21 DIAGNOSIS — N3090 Cystitis, unspecified without hematuria: Secondary | ICD-10-CM | POA: Diagnosis not present

## 2022-04-21 HISTORY — DX: Diverticulosis of intestine, part unspecified, without perforation or abscess without bleeding: K57.90

## 2022-04-21 HISTORY — DX: Basal cell carcinoma of skin, unspecified: C44.91

## 2022-04-21 HISTORY — DX: Gastro-esophageal reflux disease without esophagitis: K21.9

## 2022-04-21 HISTORY — DX: Squamous cell carcinoma of skin, unspecified: C44.92

## 2022-04-21 HISTORY — DX: Carpal tunnel syndrome, bilateral upper limbs: G56.03

## 2022-04-21 HISTORY — DX: Diaphragmatic hernia without obstruction or gangrene: K44.9

## 2022-04-21 HISTORY — DX: Other chronic pain: G89.29

## 2022-04-21 HISTORY — PX: CYSTOSCOPY WITH BIOPSY: SHX5122

## 2022-04-21 SURGERY — CYSTOSCOPY, WITH BIOPSY
Anesthesia: General | Site: Bladder

## 2022-04-21 MED ORDER — FENTANYL CITRATE (PF) 100 MCG/2ML IJ SOLN
INTRAMUSCULAR | Status: AC
  Filled 2022-04-21: qty 2

## 2022-04-21 MED ORDER — FAMOTIDINE 20 MG PO TABS: 20.0000 mg | ORAL_TABLET | Freq: Once | ORAL | Status: AC

## 2022-04-21 MED ORDER — EPHEDRINE SULFATE (PRESSORS) 50 MG/ML IJ SOLN
INTRAMUSCULAR | Status: DC | PRN
  Administered 2022-04-21: 5 mg via INTRAVENOUS
  Administered 2022-04-21: 10 mg via INTRAVENOUS
  Administered 2022-04-21 (×2): 5 mg via INTRAVENOUS

## 2022-04-21 MED ORDER — STERILE WATER FOR IRRIGATION IR SOLN
Status: DC | PRN
  Administered 2022-04-21: 3000 mL via INTRAVESICAL

## 2022-04-21 MED ORDER — CIPROFLOXACIN IN D5W 400 MG/200ML IV SOLN
400.0000 mg | INTRAVENOUS | Status: AC
  Administered 2022-04-21: 400 mg via INTRAVENOUS

## 2022-04-21 MED ORDER — FENTANYL CITRATE (PF) 100 MCG/2ML IJ SOLN: 25.0000 ug | INTRAMUSCULAR | Status: DC | PRN

## 2022-04-21 MED ORDER — LIDOCAINE HCL (PF) 2 % IJ SOLN
INTRAMUSCULAR | Status: AC
  Filled 2022-04-21: qty 5

## 2022-04-21 MED ORDER — MIDAZOLAM HCL 2 MG/2ML IJ SOLN
INTRAMUSCULAR | Status: DC | PRN
  Administered 2022-04-21 (×2): .5 mg via INTRAVENOUS

## 2022-04-21 MED ORDER — CHLORHEXIDINE GLUCONATE 0.12 % MT SOLN
OROMUCOSAL | Status: AC
  Filled 2022-04-21: qty 15

## 2022-04-21 MED ORDER — LACTATED RINGERS IV SOLN: INTRAVENOUS | Status: DC

## 2022-04-21 MED ORDER — ONDANSETRON HCL 4 MG/2ML IJ SOLN
INTRAMUSCULAR | Status: DC | PRN
  Administered 2022-04-21: 4 mg via INTRAVENOUS

## 2022-04-21 MED ORDER — ONDANSETRON HCL 4 MG/2ML IJ SOLN: 4.0000 mg | Freq: Once | INTRAMUSCULAR | Status: DC | PRN

## 2022-04-21 MED ORDER — PROPOFOL 10 MG/ML IV BOLUS
INTRAVENOUS | Status: DC | PRN
  Administered 2022-04-21: 150 mg via INTRAVENOUS
  Administered 2022-04-21: 50 mg via INTRAVENOUS

## 2022-04-21 MED ORDER — MIDAZOLAM HCL 2 MG/2ML IJ SOLN
INTRAMUSCULAR | Status: AC
  Filled 2022-04-21: qty 2

## 2022-04-21 MED ORDER — CIPROFLOXACIN IN D5W 400 MG/200ML IV SOLN
INTRAVENOUS | Status: AC
  Filled 2022-04-21: qty 200

## 2022-04-21 MED ORDER — ONDANSETRON HCL 4 MG/2ML IJ SOLN
INTRAMUSCULAR | Status: AC
  Filled 2022-04-21: qty 2

## 2022-04-21 MED ORDER — PHENYLEPHRINE 80 MCG/ML (10ML) SYRINGE FOR IV PUSH (FOR BLOOD PRESSURE SUPPORT)
PREFILLED_SYRINGE | INTRAVENOUS | Status: DC | PRN
  Administered 2022-04-21: 80 ug via INTRAVENOUS
  Administered 2022-04-21: 160 ug via INTRAVENOUS
  Administered 2022-04-21: 80 ug via INTRAVENOUS
  Administered 2022-04-21: 160 ug via INTRAVENOUS

## 2022-04-21 MED ORDER — FAMOTIDINE 20 MG PO TABS
ORAL_TABLET | ORAL | Status: AC
  Administered 2022-04-21: 20 mg via ORAL
  Filled 2022-04-21: qty 1

## 2022-04-21 MED ORDER — DEXAMETHASONE SODIUM PHOSPHATE 10 MG/ML IJ SOLN
INTRAMUSCULAR | Status: DC | PRN
  Administered 2022-04-21: 10 mg via INTRAVENOUS

## 2022-04-21 MED ORDER — LIDOCAINE HCL (CARDIAC) PF 100 MG/5ML IV SOSY
PREFILLED_SYRINGE | INTRAVENOUS | Status: DC | PRN
  Administered 2022-04-21: 80 mg via INTRAVENOUS
  Administered 2022-04-21: 20 mg via INTRAVENOUS

## 2022-04-21 MED ORDER — DEXAMETHASONE SODIUM PHOSPHATE 10 MG/ML IJ SOLN
INTRAMUSCULAR | Status: AC
  Filled 2022-04-21: qty 1

## 2022-04-21 MED ORDER — LACTATED RINGERS IV SOLN: INTRAVENOUS | Status: DC | PRN

## 2022-04-21 MED ORDER — CEFAZOLIN SODIUM-DEXTROSE 2-4 GM/100ML-% IV SOLN
INTRAVENOUS | Status: AC
  Filled 2022-04-21: qty 100

## 2022-04-21 MED ORDER — FENTANYL CITRATE (PF) 100 MCG/2ML IJ SOLN
INTRAMUSCULAR | Status: DC | PRN
  Administered 2022-04-21 (×2): 25 ug via INTRAVENOUS

## 2022-04-21 SURGICAL SUPPLY — 20 items
BAG DRAIN SIEMENS DORNER NS (MISCELLANEOUS) ×1 IMPLANT
BAG DRN NS LF (MISCELLANEOUS) ×1
BRUSH SCRUB EZ  4% CHG (MISCELLANEOUS) ×1
BRUSH SCRUB EZ 4% CHG (MISCELLANEOUS) ×1 IMPLANT
DRSG TELFA 3X4 N-ADH STERILE (GAUZE/BANDAGES/DRESSINGS) ×1 IMPLANT
ELECT REM PT RETURN 9FT ADLT (ELECTROSURGICAL) ×1
ELECTRODE REM PT RTRN 9FT ADLT (ELECTROSURGICAL) ×1 IMPLANT
GAUZE 4X4 16PLY ~~LOC~~+RFID DBL (SPONGE) ×2 IMPLANT
GLOVE SURG UNDER POLY LF SZ7.5 (GLOVE) ×1 IMPLANT
GOWN STRL REUS W/ TWL LRG LVL3 (GOWN DISPOSABLE) ×1 IMPLANT
GOWN STRL REUS W/ TWL XL LVL3 (GOWN DISPOSABLE) ×1 IMPLANT
GOWN STRL REUS W/TWL LRG LVL3 (GOWN DISPOSABLE) ×1
GOWN STRL REUS W/TWL XL LVL3 (GOWN DISPOSABLE) ×1
KIT TURNOVER CYSTO (KITS) ×1 IMPLANT
MANIFOLD NEPTUNE II (INSTRUMENTS) ×1 IMPLANT
PACK CYSTO AR (MISCELLANEOUS) ×1 IMPLANT
SET CYSTO W/LG BORE CLAMP LF (SET/KITS/TRAYS/PACK) ×1 IMPLANT
SURGILUBE 2OZ TUBE FLIPTOP (MISCELLANEOUS) ×1 IMPLANT
WATER STERILE IRR 1000ML POUR (IV SOLUTION) ×1 IMPLANT
WATER STERILE IRR 3000ML UROMA (IV SOLUTION) ×1 IMPLANT

## 2022-04-21 NOTE — Anesthesia Postprocedure Evaluation (Signed)
Anesthesia Post Note  Patient: Kristi Greer  Procedure(s) Performed: CYSTOSCOPY WITH BLADDER BIOPSY (Bladder)  Patient location during evaluation: PACU Anesthesia Type: General Level of consciousness: awake and alert Pain management: pain level controlled Vital Signs Assessment: post-procedure vital signs reviewed and stable Respiratory status: spontaneous breathing, nonlabored ventilation, respiratory function stable and patient connected to nasal cannula oxygen Cardiovascular status: blood pressure returned to baseline and stable Postop Assessment: no apparent nausea or vomiting Anesthetic complications: no   No notable events documented.   Last Vitals:  Vitals:   04/21/22 1135 04/21/22 1257  BP: 117/69   Pulse: 66 63  Resp: 17 12  Temp: 36.6 C   SpO2: 95% 99%    Last Pain:  Vitals:   04/21/22 1135  TempSrc: Temporal  PainSc: 0-No pain                 Molli Barrows

## 2022-04-21 NOTE — H&P (Signed)
04/21/22 11:54 AM   Kristi Greer Kristi Greer [birth year] [id number]  CC: Bladder stones, possible bladder lesion  HPI: 71 year old female with large cystocele planning to undergo prolapse repair with Dr. Louis Meckel later this year.  She has been followed by Dr. Matilde Sprang here in our clinic, and was recently found to have numerous stones at the base of the bladder as well as some erythema and edema that he felt warranted biopsy.  She was referred to me for consideration of stone removal and bladder biopsy/fulguration.   I personally viewed and interpreted the CT Urogram dated 02/10/2022 showing numerous small bladder stones, no hydronephrosis.   We reviewed the risks and benefits of cystoscopy, stone evacuation/cystolitholapaxy and bladder biopsy and fulguration.  Very low risk of bleeding, infection, or need for additional procedures.  We discussed possible risk of recurrence of stones if persistent incomplete emptying.  We also discussed possible risk for additional procedures pending biopsy results, and briefly reviewed the nonmuscle invasive versus muscle invasive bladder cancer pathways.   PMH: Past Medical History:  Diagnosis Date   Allergy    Anemia    Atrial fibrillation ([place])    Cardiomyopathy, nonischemic ([place])    a.) felt to be tachycardia mediated NICM; b.) TTE 09/01/2015: EF 35-40%; c.) [facility] 11/26/2015: ED 30-35%, norm cors, mPA 16, PCWP 7, CO 4.7, CI 2.8; d.) TTE 01/31/2016: EF 40-45%; e.) cMRI 04/27/2016: EF 48%; f.) TTE 12/06/2016: EF 55-60%; g.) TTE 03/25/2018: EF 50-55%   Carpal tunnel syndrome, bilateral    Chronic back pain    Congestive heart failure ([place])    a.) TTE 09/01/2015: EF 35-40%, mod MR; b.) R/LHC 11/26/2015: EF 30-35%; c.) TTE 01/31/2016: EF 40-45%, mild LVH, diffuse HK, triv MR/TR; d.) TTE 12/06/2016: EF 55-60%, mild MAC, G1DD; e.) TTE 03/25/2018: EF 50-55%, triv PR, G2DD.   Diverticulosis    Environmental and seasonal allergies    Female cystocele    GERD  (gastroesophageal reflux disease)    Hiatal hernia    History of kidney stones    Hyperlipidemia    Hypertension    Hyperthyroidism    Migraines    NSVT (nonsustained ventricular tachycardia) ([place]) 11/16/2015   a.) felt to be related to NICM in the setting of increased stressors, influenza A, hypotension Tx'd with rapid 6L IVF bolus   Overactive detrusor    Personal history of colonic polyps    Pneumonia    PONV (postoperative nausea and vomiting)    Skin cancer, basal cell    Squamous cell skin cancer    SUI (stress urinary incontinence, female)    SVT (supraventricular tachycardia) ([place]) 08/31/2015   a.) rates as high as 150 bpm; b.) EP feels as if event was "long RP tachycardia"   Uterovaginal prolapse    Vaginal atrophy     Surgical History: Past Surgical History:  Procedure Laterality Date   ANTERIOR AND POSTERIOR VAGINAL REPAIR W/ SACROSPINOUS LIGAMENT SUSPENSION  10/18/2012   CARDIAC CATHETERIZATION N/A 11/26/2015   Procedure: Right/Left Heart Cath and Coronary Angiography;  Surgeon: Jolaine Artist, MD;  Location: [place] [facility];  Service: Cardiovascular;  Laterality: N/A;   CATARACT EXTRACTION Bilateral    CHOLECYSTECTOMY  2006   COLONOSCOPY WITH PROPOFOL N/A 03/19/2017   Procedure: COLONOSCOPY WITH PROPOFOL;  Surgeon: Lollie Sails, MD;  Location: [facility] ENDOSCOPY;  Service: Endoscopy;  Laterality: N/A;   COLPOPEXY  10/18/2012   CYSTOURETHROSCOPY  10/18/2012   ESOPHAGOGASTRODUODENOSCOPY (EGD) WITH PROPOFOL N/A 10/03/2016   Procedure: ESOPHAGOGASTRODUODENOSCOPY (EGD)  WITH PROPOFOL;  Surgeon: Lollie Sails, MD;  Location: [facility] ENDOSCOPY;  Service: Endoscopy;  Laterality: N/A;   HYSTEROSCOPY  2011, 10/2010   OOPHORECTOMY     REVERSE SHOULDER ARTHROPLASTY Right 06/17/2019   Procedure: REVERSE SHOULDER ARTHROPLASTY;  Surgeon: Corky Mull, MD;  Location: [facility] ORS;  Service: Orthopedics;  Laterality: Right;   TONSILLECTOMY     TRIGGER FINGER RELEASE      TUBAL LIGATION     VAGINAL HYSTERECTOMY     midurethral sling   Family History: Family History  Problem Relation Age of Onset   Hypertension Mother    Stroke Mother    Diabetes Mother    Heart disease Father    Hypercholesterolemia Father    Hypertension Father    Stroke Father    Heart attack Father    Breast cancer Neg Hx    Colon cancer Neg Hx    Thyroid disease Neg Hx     Social History:  reports that she has never smoked. She has never been exposed to tobacco smoke. She has never used smokeless tobacco. She reports current alcohol use. She reports that she does not currently use drugs.  Physical Exam: BP 117/69   Pulse 66   Temp 97.9 F (36.6 C) (Temporal)   Resp 17   LMP 10/18/2012   SpO2 95%    Constitutional:  Alert and oriented, No acute distress. Cardiovascular:  Respiratory: Normal respiratory effort, no increased work of breathing. GI: Abdomen is soft, nontender, nondistended, no abdominal masses   Laboratory Data: Urine culture 8/16 with Enterococcus, treated with culture appropriate antibiotics  Assessment & Plan:   71 year old female with large cystocele and likely incomplete emptying contributing to multiple bladder stones.  Found to have some subtle erythema at the posterior bladder wall and numerous stones on recent cystoscopy with Dr. Matilde Sprang, and he recommended stone evacuation/cystolitholapaxy and bladder biopsy with me.  She is planning to undergo prolapse repair with Dr. Louis Meckel later this year.  We reviewed the risks and benefits of cystoscopy, stone evacuation/cystolitholapaxy and bladder biopsy and fulguration.  Very low risk of bleeding, infection, or need for additional procedures.  We discussed possible risk of recurrence of stones if persistent incomplete emptying.  We also discussed possible risk for additional procedures pending biopsy results, and briefly reviewed the nonmuscle invasive versus muscle invasive bladder cancer  pathways.  Cystoscopy, bladder stone evacuation, possible cystolitholapaxy, bladder biopsy and fulguration  Kristi Madrid, MD 04/21/2022  [place] [address], [place], [place] [address] [phone]

## 2022-04-21 NOTE — Op Note (Signed)
Date of procedure: 04/21/22  Preoperative diagnosis:  Bladder stones Bladder lesion  Postoperative diagnosis:  Same  Procedure: Cystoscopy, bladder biopsy and fulguration of 1 cm lesion Evacuation of multiple small bladder stones  Surgeon: [name], MD  Anesthesia: General  Complications: None  Intraoperative findings:  Significant cystocele and prolapse Numerous small yellow stones at the base of the bladder, all irrigated free Mild to moderate erythema at the trigone between the ureteral orifices, uncomplicated biopsy and fulguration, excellent hemostasis  EBL: Minimal  Specimens: Bladder lesion  Drains: None  Indication: Kristi Greer is a 71 y.o. patient with large cystocele and prolapse who ultimately is planning to undergo surgical repair with Dr. Louis Meckel in [place].  She was recently found to have numerous bladder stones with recurrent UTIs and some erythema at the base of the bladder, and was scheduled for bladder biopsy and fulguration by Dr Matilde Sprang.  After reviewing the management options for treatment, they elected to proceed with the above surgical procedure(s). We have discussed the potential benefits and risks of the procedure, side effects of the proposed treatment, the likelihood of the patient achieving the goals of the procedure, and any potential problems that might occur during the procedure or recuperation. Informed consent has been obtained.  Description of procedure:  The patient was taken to the operating room and general anesthesia was induced. SCDs were placed for DVT prophylaxis. The patient was placed in the dorsal lithotomy position, prepped and draped in the usual sterile fashion, and preoperative antibiotics(Ancef) were administered. A preoperative time-out was performed.   A 21 French rigid cystoscope was used to intubate the urethra and thorough cystoscopy was performed.  There was a very large cystocele and prolapse.  There were  numerous small yellow stones at the base of the bladder.  A sponge was used to reduce the prolapse and facilitate cystoscopy with evacuation of the bladder stones.  Once the stones were irrigated free from the bladder, thorough cystoscopy showed some mild to moderate erythema at the trigone between the ureteral orifices, but the bladder was otherwise grossly normal.  The cold cup biopsy forceps were used to take 2 biopsies at the trigone at the area of erythema, and these were meticulously fulgurated with the [place].  The ureteral orifices were intact at the conclusion of fulguration with good efflux.  With the bladder decompressed there was excellent hemostasis.  The bladder was drained and this concluded our procedure.  Disposition: Stable to PACU  Plan: Call with pathology results  [name], MD

## 2022-04-21 NOTE — Discharge Instructions (Signed)
AMBULATORY SURGERY  ?DISCHARGE INSTRUCTIONS ? ? ?The drugs that you were given will stay in your system until tomorrow so for the next 24 hours you should not: ? ?Drive an automobile ?Make any legal decisions ?Drink any alcoholic beverage ? ? ?You may resume regular meals tomorrow.  Today it is better to start with liquids and gradually work up to solid foods. ? ?You may eat anything you prefer, but it is better to start with liquids, then soup and crackers, and gradually work up to solid foods. ? ? ?Please notify your doctor immediately if you have any unusual bleeding, trouble breathing, redness and pain at the surgery site, drainage, fever, or pain not relieved by medication. ? ? ? ?Additional Instructions: ? ? ? ?Please contact your physician with any problems or Same Day Surgery at [phone], Monday through Friday 6 am to 4 pm, or [place] [facility] [place] Main number at [phone].  ?

## 2022-04-21 NOTE — Transfer of Care (Signed)
Immediate Anesthesia Transfer of Care Note  Patient: Kristi Greer  Procedure(s) Performed: CYSTOSCOPY WITH BLADDER BIOPSY (Bladder)  Patient Location: PACU  Anesthesia Type:General  Level of Consciousness: drowsy  Airway & Oxygen Therapy: Patient Spontanous Breathing and Patient connected to face mask oxygen  Post-op Assessment: Report given to RN and Post -op Vital signs reviewed and stable  Post vital signs: Reviewed and stable  Last Vitals:  Vitals Value Taken Time  BP 100/64 04/21/22 1253  Temp    Pulse 64 04/21/22 1257  Resp 12 04/21/22 1257  SpO2 100 % 04/21/22 1257  Vitals shown include unvalidated device data.  Last Pain:  Vitals:   04/21/22 1135  TempSrc: Temporal  PainSc: 0-No pain         Complications: No notable events documented.

## 2022-04-21 NOTE — Anesthesia Preprocedure Evaluation (Signed)
Anesthesia Evaluation  Patient identified by MRN, date of birth, ID band Patient awake    Reviewed: Allergy & Precautions, H&P , NPO status , Patient's Chart, lab work & pertinent test results, reviewed documented beta blocker date and time   History of Anesthesia Complications (+) PONV and history of anesthetic complications  Airway Mallampati: II  TM Distance: >3 FB Neck ROM: full    Dental  (+) Teeth Intact   Pulmonary pneumonia,    Pulmonary exam normal        Cardiovascular Exercise Tolerance: Good hypertension, On Medications +CHF  Normal cardiovascular exam Rate:Normal     Neuro/Psych  Headaches,  Neuromuscular disease negative psych ROS   GI/Hepatic Neg liver ROS, hiatal hernia, GERD  Medicated,  Endo/Other  Hyperthyroidism   Renal/GU Renal disease  negative genitourinary   Musculoskeletal   Abdominal   Peds  Hematology  (+) Blood dyscrasia, anemia ,   Anesthesia Other Findings   Reproductive/Obstetrics negative OB ROS                             Anesthesia Physical Anesthesia Plan  ASA: 3  Anesthesia Plan: General LMA   Post-op Pain Management:    Induction:   PONV Risk Score and Plan: 4 or greater  Airway Management Planned:   Additional Equipment:   Intra-op Plan:   Post-operative Plan:   Informed Consent: I have reviewed the patients History and Physical, chart, labs and discussed the procedure including the risks, benefits and alternatives for the proposed anesthesia with the patient or authorized representative who has indicated his/her understanding and acceptance.       Plan Discussed with: CRNA  Anesthesia Plan Comments:         Anesthesia Quick Evaluation

## 2022-04-21 NOTE — Anesthesia Procedure Notes (Signed)
Procedure Name: LMA Insertion Date/Time: 04/21/2022 12:19 PM  Performed by: Loletha Grayer, CRNAPre-anesthesia Checklist: Patient identified, Patient being monitored, Timeout performed, Emergency Drugs available and Suction available Patient Re-evaluated:Patient Re-evaluated prior to induction Oxygen Delivery Method: Circle system utilized Preoxygenation: Pre-oxygenation with 100% oxygen Induction Type: IV induction Ventilation: Mask ventilation without difficulty LMA: LMA inserted LMA Size: 4.0 Number of attempts: 1 Placement Confirmation: positive ETCO2 Tube secured with: Tape Dental Injury: Teeth and Oropharynx as per pre-operative assessment

## 2022-04-22 ENCOUNTER — Encounter: Payer: Self-pay | Admitting: Urology

## 2022-04-24 ENCOUNTER — Inpatient Hospital Stay: Admission: RE | Admit: 2022-04-24 | Payer: Medicare Other | Source: Ambulatory Visit

## 2022-04-24 LAB — SURGICAL PATHOLOGY

## 2022-04-25 ENCOUNTER — Telehealth: Payer: Self-pay

## 2022-04-25 NOTE — Telephone Encounter (Signed)
-----   Message from Billey Co, MD sent at 04/24/2022  2:45 PM EDT ----- Regarding: biospy results Great news, bladder biopsy showed no evidence of cancer.  Can proceed with surgery in [facility] with Dr. Lyn Henri, MD 04/24/2022

## 2022-04-25 NOTE — Telephone Encounter (Signed)
Called pt informed her of the information below. Pt voiced understanding.  

## 2022-04-28 ENCOUNTER — Telehealth: Payer: Self-pay | Admitting: Cardiovascular Disease

## 2022-04-28 ENCOUNTER — Other Ambulatory Visit: Payer: Self-pay | Admitting: Urology

## 2022-04-28 NOTE — Telephone Encounter (Signed)
   Pre-operative Risk Assessment    Patient Name: Kristi Greer  DOB: 27-Oct-1950 MRN: [id number]      Request for Surgical Clearance    Procedure:   robotic sacrocolpopexy   Date of Surgery:  Clearance 06/21/22                                 Surgeon:  Dr. Emogene Morgan Group or Practice Name:  [facility] Phone number:  [phone] Fax number:  [phone]   Type of Clearance Requested:   - Medical    Type of Anesthesia:  General    Additional requests/questions:      SignedMilbert Coulter   04/28/2022, 1:52 PM

## 2022-04-28 NOTE — Telephone Encounter (Signed)
   Patient Name: Kristi Greer  DOB: 1951-02-01 MRN: [id number]  Primary Cardiologist: Ida Rogue, MD  Chart reviewed as part of pre-operative protocol coverage. Given past medical history and time since last visit, based on ACC/AHA guidelines, [name] would be at acceptable risk for the planned procedure without further cardiovascular testing.   I will route this recommendation to the requesting party via Epic fax function and remove from pre-op pool.  Please call with questions.  [place], [place] 04/28/2022, 10:18 PM

## 2022-05-15 ENCOUNTER — Other Ambulatory Visit: Payer: Self-pay | Admitting: Internal Medicine

## 2022-05-24 ENCOUNTER — Other Ambulatory Visit: Payer: Self-pay | Admitting: Cardiovascular Disease

## 2022-05-30 DIAGNOSIS — N8111 Cystocele, midline: Secondary | ICD-10-CM | POA: Diagnosis not present

## 2022-05-30 DIAGNOSIS — R8271 Bacteriuria: Secondary | ICD-10-CM | POA: Diagnosis not present

## 2022-06-08 NOTE — Progress Notes (Addendum)
Anesthesia Review:  PCP: Einar Pheasant- LOV 02/08/22  and 06/12/22.  Cardiologist : Ida Rogue  Tessa Charlotte 04/20/22  Has appt with DR Rockey Situ on 06/16/22 at 1020am.  Chest x-ray : EKG : 04/24/22  and 06/12/22  Echo : 2019  Stress test: Cardiac Cath :  2017  Activity level: can do a flight of stairs somewhat without difficutly - see below  Sleep Study/ CPAP : none  Fasting Blood Sugar :      / Checks Blood Sugar -- times a day:   Blood Thinner/ Instructions /Last Dose: ASA / Instructions/ Last Dose :   04/21/22- cystoscopy  PT reports at preop she is to see DR Rockey Situ on 06/16/22 .  Pt reports she has been having some shortness of breath going up the bleachers at ballgames.  Was seen by Dr Einar Pheasant on 06/12/22.   CBC/Diff and CMP done on 06/12/22- in epic.

## 2022-06-08 NOTE — Patient Instructions (Signed)
SURGICAL WAITING ROOM VISITATION Patients having surgery or a procedure may have no more than 2 support people in the waiting area - these visitors may rotate.   Children under the age of 41 must have an adult with them who is not the patient. If the patient needs to stay at the hospital during part of their recovery, the visitor guidelines for inpatient rooms apply. Pre-op nurse will coordinate an appropriate time for 1 support person to accompany patient in pre-op.  This support person may not rotate.    Please refer to the [facility] website for the visitor guidelines for Inpatients (after your surgery is over and you are in a regular room).       Your procedure is scheduled on:  06/21/22    Report to [facility] Main Entrance    Report to admitting at   [place] AM   Call this number if you have problems the morning of surgery [phone]  Clear liquid diet the day before surgery.   Fleets enema nite before surgery  Mag Citrate - 8 ounces at noon day before surgery.     After Midnight you may have the following liquids until __ 0430____ AM  DAY OF SURGERY  Water Non-Citrus Juices (without pulp, NO RED) Carbonated Beverages Black Coffee (NO MILK/CREAM OR CREAMERS, sugar ok)  Clear Tea (NO MILK/CREAM OR CREAMERS, sugar ok) regular and decaf                             Plain Jell-O (NO RED)                                           Fruit ices (not with fruit pulp, NO RED)                                     Popsicles (NO RED)                                                               Sports drinks like Gatorade (NO RED)               If you have questions, please contact your surgeon's office.   FOLLOW BOWEL PREP AND ANY ADDITIONAL PRE OP INSTRUCTIONS YOU RECEIVED FROM YOUR SURGEON'S OFFICE!!!     Oral Hygiene is also important to reduce your risk of infection.                                    Remember - BRUSH YOUR TEETH THE MORNING OF SURGERY WITH YOUR  REGULAR TOOTHPASTE   Do NOT smoke after Midnight   Take these medicines the morning of surgery with A SIP OF WATER:  coreg   DO NOT TAKE ANY ORAL DIABETIC MEDICATIONS DAY OF YOUR SURGERY  Bring CPAP mask and tubing day of surgery.  You may not have any metal on your body including hair pins, jewelry, and body piercing             Do not wear make-up, lotions, powders, perfumes/cologne, or deodorant  Do not wear nail polish including gel and S&S, artificial/acrylic nails, or any other type of covering on natural nails including finger and toenails. If you have artificial nails, gel coating, etc. that needs to be removed by a nail salon please have this removed prior to surgery or surgery may need to be canceled/ delayed if the surgeon/ anesthesia feels like they are unable to be safely monitored.   Do not shave  48 hours prior to surgery.               Men may shave face and neck.   Do not bring valuables to the hospital. [place].   Contacts, dentures or bridgework may not be worn into surgery.   Bring small overnight bag day of surgery.   DO NOT [place]. PHARMACY WILL DISPENSE MEDICATIONS LISTED ON YOUR MEDICATION LIST TO YOU DURING YOUR ADMISSION [place]!    Patients discharged on the day of surgery will not be allowed to drive home.  Someone NEEDS to stay with you for the first 24 hours after anesthesia.   Special Instructions: Bring a copy of your healthcare power of attorney and living will documents the day of surgery if you haven't scanned them before.              Please read over the following fact sheets you were given: IF [place] [phone]   If you received a COVID test during your pre-op visit  it is requested that you wear a mask when out in public, stay away from anyone that may not be  feeling well and notify your surgeon if you develop symptoms. If you test positive for Covid or have been in contact with anyone that has tested positive in the last 10 days please notify you surgeon.     [place] - Preparing for Surgery Before surgery, you can play an important role.  Because skin is not sterile, your skin needs to be as free of germs as possible.  You can reduce the number of germs on your skin by washing with CHG (chlorahexidine gluconate) soap before surgery.  CHG is an antiseptic cleaner which kills germs and bonds with the skin to continue killing germs even after washing. Please DO NOT use if you have an allergy to CHG or antibacterial soaps.  If your skin becomes reddened/irritated stop using the CHG and inform your nurse when you arrive at Short Stay. Do not shave (including legs and underarms) for at least 48 hours prior to the first CHG shower.  You may shave your face/neck. Please follow these instructions carefully:  1.  Shower with CHG Soap the night before surgery and the  morning of Surgery.  2.  If you choose to wash your hair, wash your hair first as usual with your  normal  shampoo.  3.  After you shampoo, rinse your hair and body thoroughly to remove the  shampoo.  4.  Use CHG as you would any other liquid soap.  You can apply chg directly  to the skin and wash                       Gently with a scrungie or clean washcloth.  5.  Apply the CHG Soap to your body ONLY FROM THE NECK DOWN.   Do not use on face/ open                           Wound or open sores. Avoid contact with eyes, ears mouth and genitals (private parts).                       Wash face,  Genitals (private parts) with your normal soap.             6.  Wash thoroughly, paying special attention to the area where your surgery  will be performed.  7.  Thoroughly rinse your body with warm water from the neck down.  8.  DO NOT shower/wash with your normal soap after using  and rinsing off  the CHG Soap.                9.  Pat yourself dry with a clean towel.            10.  Wear clean pajamas.            11.  Place clean sheets on your bed the night of your first shower and do not  sleep with pets. Day of Surgery : Do not apply any lotions/deodorants the morning of surgery.  Please wear clean clothes to the hospital/surgery center.  FAILURE TO FOLLOW THESE INSTRUCTIONS MAY RESULT IN THE CANCELLATION OF YOUR SURGERY PATIENT SIGNATURE_________________________________  NURSE SIGNATURE__________________________________  ________________________________________________________________________

## 2022-06-12 ENCOUNTER — Ambulatory Visit (INDEPENDENT_AMBULATORY_CARE_PROVIDER_SITE_OTHER): Payer: Medicare Other | Admitting: Internal Medicine

## 2022-06-12 ENCOUNTER — Encounter: Payer: Self-pay | Admitting: Internal Medicine

## 2022-06-12 ENCOUNTER — Other Ambulatory Visit: Payer: Self-pay | Admitting: Internal Medicine

## 2022-06-12 VITALS — BP 120/70 | HR 80 | Temp 97.8°F | Resp 14 | Ht 61.0 in | Wt 161.4 lb

## 2022-06-12 DIAGNOSIS — E0590 Thyrotoxicosis, unspecified without thyrotoxic crisis or storm: Secondary | ICD-10-CM | POA: Diagnosis not present

## 2022-06-12 DIAGNOSIS — I420 Dilated cardiomyopathy: Secondary | ICD-10-CM

## 2022-06-12 DIAGNOSIS — I5022 Chronic systolic (congestive) heart failure: Secondary | ICD-10-CM | POA: Diagnosis not present

## 2022-06-12 DIAGNOSIS — Z23 Encounter for immunization: Secondary | ICD-10-CM

## 2022-06-12 DIAGNOSIS — R0609 Other forms of dyspnea: Secondary | ICD-10-CM

## 2022-06-12 DIAGNOSIS — R739 Hyperglycemia, unspecified: Secondary | ICD-10-CM

## 2022-06-12 DIAGNOSIS — F439 Reaction to severe stress, unspecified: Secondary | ICD-10-CM

## 2022-06-12 DIAGNOSIS — I471 Supraventricular tachycardia, unspecified: Secondary | ICD-10-CM | POA: Diagnosis not present

## 2022-06-12 DIAGNOSIS — Z86010 Personal history of colonic polyps: Secondary | ICD-10-CM | POA: Diagnosis not present

## 2022-06-12 DIAGNOSIS — E7800 Pure hypercholesterolemia, unspecified: Secondary | ICD-10-CM | POA: Diagnosis not present

## 2022-06-12 DIAGNOSIS — K219 Gastro-esophageal reflux disease without esophagitis: Secondary | ICD-10-CM

## 2022-06-12 LAB — CBC WITH DIFFERENTIAL/PLATELET
Basophils Absolute: 0 10*3/uL (ref 0.0–0.1)
Basophils Relative: 0.6 % (ref 0.0–3.0)
Eosinophils Absolute: 0.2 10*3/uL (ref 0.0–0.7)
Eosinophils Relative: 2.5 % (ref 0.0–5.0)
HCT: 42.4 % (ref 36.0–46.0)
Hemoglobin: 13.7 g/dL (ref 12.0–15.0)
Lymphocytes Relative: 36.2 % (ref 12.0–46.0)
Lymphs Abs: 2.6 10*3/uL (ref 0.7–4.0)
MCHC: 32.4 g/dL (ref 30.0–36.0)
MCV: 88.8 fl (ref 78.0–100.0)
Monocytes Absolute: 0.5 10*3/uL (ref 0.1–1.0)
Monocytes Relative: 6.3 % (ref 3.0–12.0)
Neutro Abs: 3.9 10*3/uL (ref 1.4–7.7)
Neutrophils Relative %: 54.4 % (ref 43.0–77.0)
Platelets: 208 10*3/uL (ref 150.0–400.0)
RBC: 4.78 Mil/uL (ref 3.87–5.11)
RDW: 14 % (ref 11.5–15.5)
WBC: 7.2 10*3/uL (ref 4.0–10.5)

## 2022-06-12 LAB — LIPID PANEL
Cholesterol: 150 mg/dL (ref 0–200)
HDL: 52.1 mg/dL (ref >39.00)
LDL Cholesterol: 70 mg/dL (ref 0–99)
NonHDL: 98.26
Total CHOL/HDL Ratio: 3
Triglycerides: 142 mg/dL (ref 0.0–149.0)
VLDL: 28.4 mg/dL (ref 0.0–40.0)

## 2022-06-12 LAB — BASIC METABOLIC PANEL
BUN: 18 mg/dL (ref 6–23)
CO2: 26 mEq/L (ref 19–32)
Calcium: 9.4 mg/dL (ref 8.4–10.5)
Chloride: 108 mEq/L (ref 96–112)
Creatinine, Ser: 0.82 mg/dL (ref 0.40–1.20)
GFR: 72.95 mL/min (ref >60.00)
Glucose, Bld: 90 mg/dL (ref 70–99)
Potassium: 4.5 mEq/L (ref 3.5–5.1)
Sodium: 141 mEq/L (ref 135–145)

## 2022-06-12 LAB — HEPATIC FUNCTION PANEL
ALT: 12 U/L (ref 0–35)
AST: 18 U/L (ref 0–37)
Albumin: 4.1 g/dL (ref 3.5–5.2)
Alkaline Phosphatase: 100 U/L (ref 39–117)
Bilirubin, Direct: 0.1 mg/dL (ref 0.0–0.3)
Total Bilirubin: 0.5 mg/dL (ref 0.2–1.2)
Total Protein: 6.8 g/dL (ref 6.0–8.3)

## 2022-06-12 LAB — HEMOGLOBIN A1C: Hgb A1c MFr Bld: 6.1 % (ref 4.6–6.5)

## 2022-06-12 NOTE — Assessment & Plan Note (Signed)
Doing well.  Feels good.  Has good family support. Follow.

## 2022-06-12 NOTE — Assessment & Plan Note (Signed)
Upper symptoms controlled on protonix.  

## 2022-06-12 NOTE — Assessment & Plan Note (Signed)
Describes DOE with walking - especially with inclines.  Some associated DOE with walking up inclines.  EKG - SR with no acute ischemic changes.  (Nonspecific T wave abnormality).  Has f/u appt with cardiology this week.  Continue risk factor modification.  Further w/up pending cardiology assessment.

## 2022-06-12 NOTE — Progress Notes (Signed)
[name], female   DOB: 1951/02/25, 71 y.o.   MRN: [id number]   Subjective:    [name], female    DOB: [birth year], 71 y.o.   MRN: [id number]   Patient here for  Chief Complaint  Patient presents with   Follow-up    4 month f/u   .   HPI Here to follow up regarding her blood pressure and cholesterol.  Seeing urology.  Had cystoscopy - bladder biopsy negative.  Planning for robotic sacrocolpopexy.  Has f/u with cardiology this week.  Reports she has noticed over the last several weeks that she gets more out of breath with walking - specifically noticed when walking up inclines.  Also has noticed some chest tightness with ambulation - specifically inclines.  No increased cough or congestion.  No acid reflux.  No abdominal pain.  Bowels moving.     Past Medical History:  Diagnosis Date   Allergy    Anemia    Atrial fibrillation (HCC)    Cardiomyopathy, nonischemic ([place])    a.) felt to be tachycardia mediated NICM; b.) TTE 09/01/2015: EF 35-40%; c.) [facility] 11/26/2015: ED 30-35%, norm cors, mPA 16, PCWP 7, CO 4.7, CI 2.8; d.) TTE 01/31/2016: EF 40-45%; e.) cMRI 04/27/2016: EF 48%; f.) TTE 12/06/2016: EF 55-60%; g.) TTE 03/25/2018: EF 50-55%   Carpal tunnel syndrome, bilateral    Chronic back pain    Congestive heart failure ([place])    a.) TTE 09/01/2015: EF 35-40%, mod MR; b.) R/LHC 11/26/2015: EF 30-35%; c.) TTE 01/31/2016: EF 40-45%, mild LVH, diffuse HK, triv MR/TR; d.) TTE 12/06/2016: EF 55-60%, mild MAC, G1DD; e.) TTE 03/25/2018: EF 50-55%, triv PR, G2DD.   Diverticulosis    Environmental and seasonal allergies    Female cystocele    GERD (gastroesophageal reflux disease)    Hiatal hernia    History of kidney stones    Hyperlipidemia    Hypertension    Hyperthyroidism    Migraines    NSVT (nonsustained ventricular tachycardia) ([place]) 11/16/2015   a.) felt to be related to NICM in the setting of increased stressors, influenza A,  hypotension Tx'd with rapid 6L IVF bolus   Overactive detrusor    Personal history of colonic polyps    Pneumonia    PONV (postoperative nausea and vomiting)    Skin cancer, basal cell    Squamous cell skin cancer    SUI (stress urinary incontinence, female)    SVT (supraventricular tachycardia) 08/31/2015   a.) rates as high as 150 bpm; b.) EP feels as if event was "long RP tachycardia"   Uterovaginal prolapse    Vaginal atrophy    Past Surgical History:  Procedure Laterality Date   ANTERIOR AND POSTERIOR VAGINAL REPAIR W/ SACROSPINOUS LIGAMENT SUSPENSION  10/18/2012   CARDIAC CATHETERIZATION N/A 11/26/2015   Procedure: Right/Left Heart Cath and Coronary Angiography;  Surgeon: Jolaine Artist, MD;  Location: [facility];  Service: Cardiovascular;  Laterality: N/A;   CATARACT EXTRACTION Bilateral    CHOLECYSTECTOMY  2006   COLONOSCOPY WITH PROPOFOL N/A 03/19/2017   Procedure: COLONOSCOPY WITH PROPOFOL;  Surgeon: Lollie Sails, MD;  Location: [facility] ENDOSCOPY;  Service: Endoscopy;  Laterality: N/A;   COLPOPEXY  10/18/2012   CYSTOSCOPY WITH BIOPSY N/A 04/21/2022   Procedure: CYSTOSCOPY WITH BLADDER BIOPSY;  Surgeon: Billey Co, MD;  Location: [facility] ORS;  Service: Urology;  Laterality: N/A;   CYSTOURETHROSCOPY  10/18/2012   ESOPHAGOGASTRODUODENOSCOPY (EGD) WITH PROPOFOL  N/A 10/03/2016   Procedure: ESOPHAGOGASTRODUODENOSCOPY (EGD) WITH PROPOFOL;  Surgeon: Lollie Sails, MD;  Location[facility] ENDOSCOPY;  Service: Endoscopy;  Laterality: N/A;   HYSTEROSCOPY  2011, 10/2010   OOPHORECTOMY     REVERSE SHOULDER ARTHROPLASTY Right 06/17/2019   Procedure: REVERSE SHOULDER ARTHROPLASTY;  Surgeon: Corky Mull, MD;  Location: [facility] ORS;  Service: Orthopedics;  Laterality: Right;   TONSILLECTOMY     TRIGGER FINGER RELEASE     TUBAL LIGATION     VAGINAL HYSTERECTOMY     midurethral sling   Family History  Problem Relation Age of Onset   Hypertension Mother    Stroke Mother     Diabetes Mother    Heart disease Father    Hypercholesterolemia Father    Hypertension Father    Stroke Father    Heart attack Father    Breast cancer Neg Hx    Colon cancer Neg Hx    Thyroid disease Neg Hx    Social History   Socioeconomic History   Marital status: Widowed    Spouse name: Not on file   Number of children: 2   Years of education: Not on file   Highest education level: Not on file  Occupational History   Not on file  Tobacco Use   Smoking status: Never    Passive exposure: Never   Smokeless tobacco: Never  Vaping Use   Vaping Use: Never used  Substance and Sexual Activity   Alcohol use: Yes    Alcohol/week: 0.0 standard drinks of alcohol    Comment: Socially   Drug use: Not Currently   Sexual activity: Yes    Birth control/protection: Post-menopausal  Other Topics Concern   Not on file  Social History Narrative   Lives alone   Social Determinants of Health   Financial Resource Strain: Low Risk  (11/14/2021)   Overall Financial Resource Strain (CARDIA)    Difficulty of Paying Living Expenses: Not hard at all  Food Insecurity: No Food Insecurity (11/14/2021)   Hunger Vital Sign    Worried About Running Out of Food in the Last Year: Never true    Ran Out of Food in the Last Year: Never true  Transportation Needs: No Transportation Needs (11/14/2021)   PRAPARE - Hydrologist (Medical): No    Lack of Transportation (Non-Medical): No  Physical Activity: Not on file  Stress: No Stress Concern Present (11/14/2021)   [place]    Feeling of Stress : Not at all  Social Connections: Moderately Integrated (11/14/2021)   Social Connection and Isolation Panel [NHANES]    Frequency of Communication with Friends and Family: More than three times a week    Frequency of Social Gatherings with Friends and Family: More than three times a week    Attends Religious  Services: More than 4 times per year    Active Member of [employer] or Organizations: Yes    Attends Archivist Meetings: Not on file    Marital Status: Widowed     Review of Systems  Constitutional:  Negative for appetite change and unexpected weight change.  HENT:  Negative for congestion and sinus pressure.   Respiratory:  Negative for cough and chest tightness.        DOE.   Cardiovascular:  Negative for chest pain, palpitations and leg swelling.  Gastrointestinal:  Negative for abdominal pain, diarrhea, nausea and vomiting.  Genitourinary:  Negative for  difficulty urinating and dysuria.  Musculoskeletal:  Negative for joint swelling and myalgias.  Skin:  Negative for color change and rash.  Neurological:  Negative for dizziness, light-headedness and headaches.  Psychiatric/Behavioral:  Negative for agitation and dysphoric mood.        Objective:     BP 120/70 (BP Location: Left Arm, Patient Position: Sitting, Cuff Size: Normal)   Pulse 80   Temp 97.8 F (36.6 C) (Oral)   Resp 14   Ht _0  (1.549 m)   Wt 161 lb 6.4 oz (73.2 kg)   LMP 10/18/2012   SpO2 98%   BMI 30.50 kg/m  Wt Readings from Last 3 Encounters:  06/12/22 161 lb 6.4 oz (73.2 kg)  04/12/22 158 lb (71.7 kg)  03/20/22 153 lb (69.4 kg)    Physical Exam Vitals reviewed.  Constitutional:      General: She is not in acute distress.    Appearance: Normal appearance.  HENT:     Head: Normocephalic and atraumatic.     Right Ear: External ear normal.     Left Ear: External ear normal.  Eyes:     General: No scleral icterus.       Right eye: No discharge.        Left eye: No discharge.     Conjunctiva/sclera: Conjunctivae normal.  Neck:     Thyroid: No thyromegaly.  Cardiovascular:     Rate and Rhythm: Normal rate and regular rhythm.  Pulmonary:     Effort: No respiratory distress.     Breath sounds: Normal breath sounds. No wheezing.  Abdominal:     General: Bowel sounds are normal.      Palpations: Abdomen is soft.     Tenderness: There is no abdominal tenderness.  Musculoskeletal:        General: No swelling or tenderness.     Cervical back: Neck supple. No tenderness.  Lymphadenopathy:     Cervical: No cervical adenopathy.  Skin:    Findings: No erythema or rash.  Neurological:     Mental Status: She is alert.  Psychiatric:        Mood and Affect: Mood normal.        Behavior: Behavior normal.      Outpatient Encounter Medications as of 06/12/2022  Medication Sig   acetaminophen (TYLENOL) 500 MG tablet Take 500-1,000 mg by mouth every 6 (six) hours as needed for moderate pain or headache.   acetaminophen (TYLENOL) 650 MG CR tablet Take 650 mg by mouth 2 (two) times daily with a meal.   calcium carbonate (OSCAL) 1500 (600 Ca) MG TABS tablet Take 1,500 mg by mouth daily. Vitamin D   calcium carbonate (TUMS - DOSED IN MG ELEMENTAL CALCIUM) 500 MG chewable tablet Chew 1 tablet by mouth daily as needed for indigestion or heartburn.   carvedilol (COREG) 12.5 MG tablet TAKE 1 TABLET (12.5MG TOTAL) BY MOUTH TWICE A DAY WITH MEALS   Cholecalciferol 250 MCG (10000 UT) CAPS Take 10,000 Units by mouth daily.   fexofenadine (ALLEGRA) 180 MG tablet Take 180 mg by mouth at bedtime.    furosemide (LASIX) 20 MG tablet TAKE 1 TABLET (20 MG TOTAL) BY MOUTH EVERY OTHER DAY. (Patient taking differently: Take 20 mg by mouth daily as needed (only when home).)   meloxicam (MOBIC) 15 MG tablet Take 15 mg by mouth at bedtime.   methimazole (TAPAZOLE) 5 MG tablet Take 0.5 tablets (2.5 mg total) by mouth daily.   rosuvastatin (CRESTOR) 10  MG tablet TAKE 1 TABLET BY MOUTH EVERY DAY   SUMAtriptan (IMITREX) 100 MG tablet MAY REPEAT IN 2 HOURS IF HEADACHE PERSISTS OR RECURS. (Patient taking differently: Take 50 mg by mouth every 2 (two) hours as needed for migraine or headache. May repeat in 2 hours if headache persists or recurs.)   triamcinolone (NASACORT) 55 MCG/ACT AERO nasal inhaler Place 1  spray into the nose daily as needed (allergies).   zonisamide (ZONEGRAN) 100 MG capsule TAKE 1 CAPSULE BY MOUTH EVERY DAY   [DISCONTINUED] tiZANidine (ZANAFLEX) 2 MG tablet Take 1 tablet (2 mg total) by mouth at bedtime as needed for muscle spasms. (Patient not taking: Reported on 06/07/2022)   [DISCONTINUED] trimethoprim (TRIMPEX) 100 MG tablet Take 1 tablet (100 mg total) by mouth daily. (Patient not taking: Reported on 06/07/2022)   No facility-administered encounter medications on file as of 06/12/2022.     Lab Results  Component Value Date   WBC 7.2 06/12/2022   HGB 13.7 06/12/2022   HCT 42.4 06/12/2022   PLT 208.0 06/12/2022   GLUCOSE 90 06/12/2022   CHOL 150 06/12/2022   TRIG 142.0 06/12/2022   HDL 52.10 06/12/2022   LDLCALC 70 06/12/2022   ALT 12 06/12/2022   AST 18 06/12/2022   NA 141 06/12/2022   K 4.5 06/12/2022   CL 108 06/12/2022   CREATININE 0.82 06/12/2022   BUN 18 06/12/2022   CO2 26 06/12/2022   TSH 2.27 03/02/2022   INR 0.98 11/26/2015   HGBA1C 6.1 06/12/2022       Assessment & Plan:   Problem List Items Addressed This Visit     DOE (dyspnea on exertion) - Primary    Describes DOE with walking - especially with inclines.  Some associated DOE with walking up inclines.  EKG - SR with no acute ischemic changes.  (Nonspecific T wave abnormality).  Has f/u appt with cardiology this week.  Continue risk factor modification.  Further w/up pending cardiology assessment.        Relevant Orders   EKG 12-Lead (Completed)   CBC with Differential/Platelet (Completed)   GERD (gastroesophageal reflux disease)    Upper symptoms controlled on protonix.        History of colonic polyps    Colonoscopy 02/2017.  Recommended f/u in 5 years.  Had f/u colonoscopy 02/21/21.       Hypercholesterolemia    On crestor.  Low cholesterol diet and exercise.  Follow lipid panel and liver function tests.        Relevant Orders   Basic Metabolic Panel (BMET) (Completed)    Hepatic function panel (Completed)   Lipid Profile (Completed)   Hyperglycemia    Low carb diet and exercise.  Follow met b and a1c.       Relevant Orders   HgB A1c (Completed)   Hyperthyroidism    Has been followed by endocrinology.  On tapazole. Recently evaluated and dose adjusted - 2.42m q day now.  Follow TFTs.       Stress    Doing well.  Feels good.  Has good family support. Follow.       Chronic systolic heart failure (HCC) (Chronic)    Continue coreg and lasix.  No evidence of volume overload.  Follow.       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HCC) (Chronic)    No evidence of volume overload.  On coreg and lasix.  Follow. Check metabolic panel.       SVT (supraventricular tachycardia) (Chronic)  On coreg.  No increased heart rate or palpitations.  Follow.       Other Visit Diagnoses     Need for immunization against influenza       Relevant Orders   Flu Vaccine QUAD High Dose(Fluad) (Completed)        Einar Pheasant, MD

## 2022-06-12 NOTE — Assessment & Plan Note (Signed)
Colonoscopy 02/2017.  Recommended f/u in 5 years.  Had f/u colonoscopy 02/21/21.  

## 2022-06-12 NOTE — Assessment & Plan Note (Signed)
Has been followed by endocrinology.  On tapazole. Recently evaluated and dose adjusted - 2.'5mg'$  q day now.  Follow TFTs.

## 2022-06-12 NOTE — Assessment & Plan Note (Signed)
On crestor.  Low cholesterol diet and exercise.  Follow lipid panel and liver function tests.   

## 2022-06-12 NOTE — Assessment & Plan Note (Signed)
Low carb diet and exercise.  Follow met b and a1c.  

## 2022-06-12 NOTE — Assessment & Plan Note (Signed)
On coreg.  No increased heart rate or palpitations.  Follow.  

## 2022-06-12 NOTE — Assessment & Plan Note (Signed)
No evidence of volume overload.  On coreg and lasix.  Follow. Check metabolic panel.

## 2022-06-12 NOTE — Assessment & Plan Note (Signed)
Continue coreg and lasix.  No evidence of volume overload.  Follow.

## 2022-06-13 ENCOUNTER — Encounter (HOSPITAL_COMMUNITY): Payer: Self-pay

## 2022-06-13 ENCOUNTER — Encounter (HOSPITAL_COMMUNITY)
Admission: RE | Admit: 2022-06-13 | Discharge: 2022-06-13 | Disposition: A | Discharge status: Home / Self Care | Payer: Medicare Other | Source: Ambulatory Visit | Attending: Urology | Admitting: Urology

## 2022-06-13 ENCOUNTER — Other Ambulatory Visit: Payer: Self-pay

## 2022-06-13 VITALS — BP 121/68 | HR 61 | Temp 97.7°F | Resp 16 | Ht 61.0 in | Wt 157.0 lb

## 2022-06-13 DIAGNOSIS — Z01812 Encounter for preprocedural laboratory examination: Secondary | ICD-10-CM | POA: Diagnosis not present

## 2022-06-13 DIAGNOSIS — Z01818 Encounter for other preprocedural examination: Secondary | ICD-10-CM

## 2022-06-13 HISTORY — DX: Unspecified osteoarthritis, unspecified site: M19.90

## 2022-06-14 LAB — URINE CULTURE: Culture: NO GROWTH

## 2022-06-14 NOTE — Telephone Encounter (Signed)
Imitrex was just refilled on 05/15/22.  Should not need now.  Need to confirm how often she is taking/needing.

## 2022-06-15 DIAGNOSIS — D0461 Carcinoma in situ of skin of right upper limb, including shoulder: Secondary | ICD-10-CM | POA: Diagnosis not present

## 2022-06-15 DIAGNOSIS — D485 Neoplasm of uncertain behavior of skin: Secondary | ICD-10-CM | POA: Diagnosis not present

## 2022-06-15 NOTE — Progress Notes (Signed)
Cardiology Office Note  Date:  06/16/2022   ID:  Kristi, Greer Sep 15, 1950, MRN [id number]  PCP:  Einar Pheasant, MD   Chief Complaint  Patient presents with   12 month follow up     Patient c/o shortness of breath on exertion. Medications reviewed by the patient verbally.     HPI:  Kristi Greer is a 71 y/o woman with h/o obesity,  migraines  HTN  Nonischemic cardiomyopathy 08/2015 EF was 35 to 40% felt to be tachycardia mediated, Nonsustained VT Recovery and cardiac function, ejection fraction 55 to 60% April 2018 and 2019 Nonobstructive coronary disease on catheterization 10/2015 Who presents for follow-up of her cardiomyopathy  Last seen by myself in clinic October 2022 Seen for preop evaluation August 2023  Has diagnosis of prolapse bladder, surgery next week on Wednesday  Very mild SOB on exertion, walking up stairs at the ball field Stopped going to gym Feels she is deconditioned Plays golf  Labs reviewed Total chol 150, LDL 70 A1C 6.1   husband with cancer, stage 4 Melanoma, passed 08/2020  Completed reverse shoulder surgery on the right Dr. Roland Rack, oct 20th  2020  Echo 2019 Normal EF 50 to 18% moderate diastolic dysfunction  EKG personally reviewed by myself on todays visit Shows normal sinus rhythm rate 62  bpm no significant ST or T wave changes No change from prior EKG  Other past medical history reviewed with her on today's visit January 2017 admitted at [facility] with acute CHF and SVT (No palpitations at the time) Received adenosine but didn't break.  converted spontaneously.  Prior history of hyperthyroidism.  Treated with tapazole and a b-blocker.    Echo 4/18 EF 55-60% 30-d monitor 5/19:   - Normal sinus rhythm    - One 13-beat run NSVT on 5/12   - No other high-grade arrhythmias or blocks.     30-d monitor brief nonsustained VT for which she was asymptomatic. She has seen Dr. Rayann Heman. They discussed possible EPS but with normal EF have  opted for medical therapy with carvedilol unless EF drops or has recurrent arrhythmias or worsening symptoms  cMRI 03/2016 LVEF 48% RV normal. No evidence of infiltrative disease.    On 10/11/15 had Myoview EF 30% No ischemia or scar.  Event monitor 4/17: NSR one 7 beat NSVT. No SVT   Echo 01/31/16 LVEF 40-45%, Grade 1 DD, Normal RV   Previous 30-day monitor in May 2019 One 13-beat run NSVT on 5/12   PMH:   has a past medical history of Allergy, Anemia, Arthritis, Atrial fibrillation ([place]), Cardiomyopathy, nonischemic ([place]), Carpal tunnel syndrome, bilateral, Chronic back pain, Congestive heart failure ([place]), Diverticulosis, Dyspnea, Environmental and seasonal allergies, Female cystocele, GERD (gastroesophageal reflux disease), Hiatal hernia, Hyperlipidemia, Hypertension, Hyperthyroidism, Migraines, NSVT (nonsustained ventricular tachycardia) ([place]) (11/16/2015), Overactive detrusor, Personal history of colonic polyps, PONV (postoperative nausea and vomiting), Skin cancer, basal cell, Squamous cell skin cancer, SUI (stress urinary incontinence, female), SVT (supraventricular tachycardia) (08/31/2015), Uterovaginal prolapse, and Vaginal atrophy.  PSH:    Past Surgical History:  Procedure Laterality Date   ANTERIOR AND POSTERIOR VAGINAL REPAIR W/ SACROSPINOUS LIGAMENT SUSPENSION  10/18/2012   bilateral trigger finger surgery      CARDIAC CATHETERIZATION N/A 11/26/2015   Procedure: Right/Left Heart Cath and Coronary Angiography;  Surgeon: Jolaine Artist, MD;  Location: [place] [facility];  Service: Cardiovascular;  Laterality: N/A;   carpal tunnel on right      CATARACT EXTRACTION Bilateral  CHOLECYSTECTOMY  2006   COLONOSCOPY WITH PROPOFOL N/A 03/19/2017   Procedure: COLONOSCOPY WITH PROPOFOL;  Surgeon: Lollie Sails, MD;  Location: [facility] ENDOSCOPY;  Service: Endoscopy;  Laterality: N/A;   COLPOPEXY  10/18/2012   CYSTOSCOPY WITH BIOPSY N/A 04/21/2022   Procedure: CYSTOSCOPY WITH  BLADDER BIOPSY;  Surgeon: Billey Co, MD;  Location: [facility] ORS;  Service: Urology;  Laterality: N/A;   CYSTOURETHROSCOPY  10/18/2012   ESOPHAGOGASTRODUODENOSCOPY (EGD) WITH PROPOFOL N/A 10/03/2016   Procedure: ESOPHAGOGASTRODUODENOSCOPY (EGD) WITH PROPOFOL;  Surgeon: Lollie Sails, MD;  Location: [facility] ENDOSCOPY;  Service: Endoscopy;  Laterality: N/A;   HYSTEROSCOPY  2011, 10/2010   OOPHORECTOMY     REVERSE SHOULDER ARTHROPLASTY Right 06/17/2019   Procedure: REVERSE SHOULDER ARTHROPLASTY;  Surgeon: Corky Mull, MD;  Location: [facility] ORS;  Service: Orthopedics;  Laterality: Right;   right shoulder replacement      TONSILLECTOMY     TRIGGER FINGER RELEASE     TUBAL LIGATION     VAGINAL HYSTERECTOMY     midurethral sling    Current Outpatient Medications  Medication Sig Dispense Refill   acetaminophen (TYLENOL) 500 MG tablet Take 500-1,000 mg by mouth every 6 (six) hours as needed for moderate pain or headache.     acetaminophen (TYLENOL) 650 MG CR tablet Take 650 mg by mouth 2 (two) times daily with a meal.     calcium carbonate (OSCAL) 1500 (600 Ca) MG TABS tablet Take 1,500 mg by mouth daily. Vitamin D     calcium carbonate (TUMS - DOSED IN MG ELEMENTAL CALCIUM) 500 MG chewable tablet Chew 1 tablet by mouth daily as needed for indigestion or heartburn.     carvedilol (COREG) 12.5 MG tablet TAKE 1 TABLET (12.'5MG'$  TOTAL) BY MOUTH TWICE A DAY WITH MEALS 180 tablet 0   Cholecalciferol 250 MCG (10000 UT) CAPS Take 10,000 Units by mouth daily.     fexofenadine (ALLEGRA) 180 MG tablet Take 180 mg by mouth at bedtime.      furosemide (LASIX) 20 MG tablet TAKE 1 TABLET (20 MG TOTAL) BY MOUTH EVERY OTHER DAY. (Patient taking differently: Take 20 mg by mouth daily as needed (only when home).) 45 tablet 3   meloxicam (MOBIC) 15 MG tablet Take 15 mg by mouth at bedtime.     methimazole (TAPAZOLE) 5 MG tablet Take 0.5 tablets (2.5 mg total) by mouth daily. 45 tablet 3   rosuvastatin (CRESTOR) 10  MG tablet TAKE 1 TABLET BY MOUTH EVERY DAY 90 tablet 3   SUMAtriptan (IMITREX) 100 MG tablet MAY REPEAT IN 2 HOURS IF HEADACHE PERSISTS OR RECURS. (Patient taking differently: Take 50 mg by mouth every 2 (two) hours as needed for migraine or headache. May repeat in 2 hours if headache persists or recurs.) 9 tablet 0   triamcinolone (NASACORT) 55 MCG/ACT AERO nasal inhaler Place 1 spray into the nose daily as needed (allergies).     zonisamide (ZONEGRAN) 100 MG capsule TAKE 1 CAPSULE BY MOUTH EVERY DAY 90 capsule 0   No current facility-administered medications for this visit.    Allergies:   Patient has no known allergies.   Social History:  The patient  reports that she has never smoked. She has never been exposed to tobacco smoke. She has never used smokeless tobacco. She reports that she does not currently use alcohol. She reports that she does not use drugs.   Family History:   family history includes Diabetes in her mother; Heart attack in her father;  Heart disease in her father; Hypercholesterolemia in her father; Hypertension in her father and mother; Stroke in her father and mother.    Review of Systems: Review of Systems  Constitutional: Negative.   HENT: Negative.    Respiratory: Negative.    Cardiovascular: Negative.   Gastrointestinal: Negative.   Musculoskeletal: Negative.   Neurological: Negative.   Psychiatric/Behavioral: Negative.    All other systems reviewed and are negative.   PHYSICAL EXAM: VS:  BP 112/68 (BP Location: Left Arm, Patient Position: Sitting, Cuff Size: Normal)   Pulse 62   Ht '5\' 1"'$  (1.549 m)   Wt 159 lb 2 oz (72.2 kg)   LMP 10/18/2012   SpO2 98%   BMI 30.07 kg/m  , BMI Body mass index is 30.07 kg/m. Constitutional:  oriented to person, place, and time. No distress.  HENT:  Head: Grossly normal Eyes:  no discharge. No scleral icterus.  Neck: No JVD, no carotid bruits  Cardiovascular: Regular rate and rhythm, no murmurs  appreciated Pulmonary/Chest: Clear to auscultation bilaterally, no wheezes or rails Abdominal: Soft.  no distension.  no tenderness.  Musculoskeletal: Normal range of motion Neurological:  normal muscle tone. Coordination normal. No atrophy Skin: Skin warm and dry Psychiatric: normal affect, pleasant  Recent Labs: 03/02/2022: TSH 2.27 06/12/2022: ALT 12; BUN 18; Creatinine, Ser 0.82; Hemoglobin 13.7; Platelets 208.0; Potassium 4.5; Sodium 141    Lipid Panel Lab Results  Component Value Date   CHOL 150 06/12/2022   HDL 52.10 06/12/2022   LDLCALC 70 06/12/2022   TRIG 142.0 06/12/2022      Wt Readings from Last 3 Encounters:  06/16/22 159 lb 2 oz (72.2 kg)  06/13/22 157 lb (71.2 kg)  06/12/22 161 lb 6.4 oz (73.2 kg)     ASSESSMENT AND PLAN:  Preop cardiovascular evaluation Acceptable risk for prolapsed bladder surgery next week No further cardiac testing needed  NSVT (nonsustained ventricular tachycardia) (HCC) Tolerating carvedilol 12.5 twice daily Prior telemetry monitor 2019 with rare episode of nonsustained VT No symptoms concerning for tachyarrhythmia, no changes made  NICM (nonischemic cardiomyopathy) ([place]) normalized ejection fraction on echocardiogram Appears euvolemic, no new symptoms concerning for acute cardiac pathology Recommended regular exercise program for conditioning  Chronic systolic heart failure ([place]) - Plan: EKG 12-Lead Continue current medications, euvolemic Ejection fraction 50 to 55% 2019, Asymptomatic Has Lasix as needed  SVT (supraventricular tachycardia) ([place]) Denies tachycardia palpitations, tolerating beta-blocker  continue Coreg 12.5 twice daily    Total encounter time more than 30 minutes  Greater than 50% was spent in counseling and coordination of care with the patient     Signed, Esmond Plants, M.D., Ph.D. 06/16/2022  [place], [place]

## 2022-06-16 ENCOUNTER — Encounter: Payer: Self-pay | Admitting: Cardiovascular Disease

## 2022-06-16 ENCOUNTER — Ambulatory Visit: Payer: Medicare Other | Attending: Cardiovascular Disease | Admitting: Cardiovascular Disease

## 2022-06-16 VITALS — BP 112/68 | HR 62 | Ht 61.0 in | Wt 159.1 lb

## 2022-06-16 DIAGNOSIS — E7800 Pure hypercholesterolemia, unspecified: Secondary | ICD-10-CM | POA: Insufficient documentation

## 2022-06-16 DIAGNOSIS — I428 Other cardiomyopathies: Secondary | ICD-10-CM

## 2022-06-16 DIAGNOSIS — I471 Supraventricular tachycardia, unspecified: Secondary | ICD-10-CM | POA: Diagnosis not present

## 2022-06-16 DIAGNOSIS — I4729 Other ventricular tachycardia: Secondary | ICD-10-CM

## 2022-06-16 DIAGNOSIS — I5022 Chronic systolic (congestive) heart failure: Secondary | ICD-10-CM | POA: Diagnosis not present

## 2022-06-16 NOTE — Anesthesia Preprocedure Evaluation (Signed)
Anesthesia Evaluation  Patient identified by MRN, date of birth, ID band  Reviewed: Allergy & Precautions, NPO status , Patient's Chart, lab work & pertinent test results  History of Anesthesia Complications (+) PONV and history of anesthetic complications  Airway Mallampati: II  TM Distance: >3 FB Neck ROM: Full    Dental no notable dental hx. (+) Dental Advisory Given, Teeth Intact   Pulmonary shortness of breath,    Pulmonary exam normal breath sounds clear to auscultation       Cardiovascular hypertension, Pt. on medications +CHF and + DOE  Normal cardiovascular exam Rhythm:Regular Rate:Normal  Echo 2019 - Left ventricle: The cavity size was normal. Wall thickness was normal. Systolic function was normal. The estimated ejection fraction was in the range of 50% to 55%. Wall motion was normal; there were no regional wall motion abnormalities. Features are consistent with a pseudonormal left ventricular filling pattern,with concomitant abnormal relaxation and increased fillingpressure (grade 2 diastolic dysfunction).   Impressions:  - Normal LV systolic function; moderate diastolic dysfunction.    LHC 2017 Assessment: 1. Normal coronary arteries 2. 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 Well compensated hemodynamics  Plan/Discussion: Suspect CM is tachy-induced. She will get monitor today. Continue HF meds including carvedilol. May need anti-arrhythmic therapy vs referral to EP for possible ablation.    Neuro/Psych  Headaches,    GI/Hepatic Neg liver ROS, hiatal hernia, GERD  ,  Endo/Other  Hyperthyroidism   Renal/GU Renal disease     Musculoskeletal  (+) Arthritis ,   Abdominal   Peds  Hematology  (+) Blood dyscrasia, anemia ,   Anesthesia Other Findings   Reproductive/Obstetrics                                                          Anesthesia Evaluation  Patient  identified by MRN, date of birth, ID band Patient awake    Reviewed: Allergy & Precautions, H&P , NPO status , Patient's Chart, lab work & pertinent test results, reviewed documented beta blocker date and time   History of Anesthesia Complications (+) PONV and history of anesthetic complications  Airway Mallampati: II  TM Distance: >3 FB Neck ROM: full    Dental  (+) Teeth Intact   Pulmonary pneumonia,    Pulmonary exam normal        Cardiovascular Exercise Tolerance: Good hypertension, On Medications +CHF  Normal cardiovascular exam Rate:Normal     Neuro/Psych  Headaches,  Neuromuscular disease negative psych ROS   GI/Hepatic Neg liver ROS, hiatal hernia, GERD  Medicated,  Endo/Other  Hyperthyroidism   Renal/GU Renal disease  negative genitourinary   Musculoskeletal   Abdominal   Peds  Hematology  (+) Blood dyscrasia, anemia ,   Anesthesia Other Findings   Reproductive/Obstetrics negative OB ROS                             Anesthesia Physical Anesthesia Plan  ASA: 3  Anesthesia Plan: General LMA   Post-op Pain Management:    Induction:   PONV Risk Score and Plan: 4 or greater  Airway Management Planned:   Additional Equipment:   Intra-op Plan:   Post-operative Plan:   Informed Consent: I have reviewed the patients History and Physical, chart, labs and discussed  the procedure including the risks, benefits and alternatives for the proposed anesthesia with the patient or authorized representative who has indicated his/her understanding and acceptance.       Plan Discussed with: CRNA  Anesthesia Plan Comments:         Anesthesia Quick Evaluation  Anesthesia Physical Anesthesia Plan  ASA: 3  Anesthesia Plan: General   Post-op Pain Management: Lidocaine infusion* and Ofirmev IV (intra-op)*   Induction: Intravenous  PONV Risk Score and Plan: 4 or greater and Ondansetron, Dexamethasone  and Treatment may vary due to age or medical condition  Airway Management Planned: Oral ETT  Additional Equipment: ClearSight  Intra-op Plan:   Post-operative Plan: Extubation in OR  Informed Consent: I have reviewed the patients History and Physical, chart, labs and discussed the procedure including the risks, benefits and alternatives for the proposed anesthesia with the patient or authorized representative who has indicated his/her understanding and acceptance.     Dental advisory given  Plan Discussed with: CRNA  Anesthesia Plan Comments: (2 x PIV, clearsight vs. aline  See PAT note 06/13/2022)      Anesthesia Quick Evaluation

## 2022-06-16 NOTE — Patient Instructions (Addendum)
Medication Instructions:  No changes  If you need a refill on your cardiac medications before your next appointment, please call your pharmacy.   Lab work: No new labs needed  Testing/Procedures: No new testing needed  Follow-Up: At [facility], you and your health needs are our priority.  As part of our continuing mission to provide you with exceptional heart care, we have created designated Provider Care Teams.  These Care Teams include your primary Cardiologist (physician) and Advanced Practice Providers (APPs -  Physician Assistants and Nurse Practitioners) who all work together to provide you with the care you need, when you need it.  You will need a follow up appointment in 12 months  Providers on your designated Care Team:   Christopher Berge, NP Ryan Dunn, PA-C Cadence Furth, PA-C  COVID-19 Vaccine Information can be found at: [email or web address] For questions related to vaccine distribution or appointments, please email [email or web address] or call [phone].   

## 2022-06-16 NOTE — Progress Notes (Signed)
Anesthesia Chart Review   Case: [id number] Date/Time: 06/21/22 0715   Procedure: XI ROBOTIC ASSISTED LAPAROSCOPIC SACROCOLPOPEXY - 57 MINUTES   Anesthesia type: General   Pre-op diagnosis: CYSTOCELE   Location: [facility] ROOM 03 / [facility]   Surgeons: [name], MD       DISCUSSION:71 y.o. never smoker with h/o PONV, HTN, NSVT on carvedilol, NICM with normalized EF on most recent echo, atrial fibrillation, cystocele scheduled for above procedure 06/21/2022 with Dr. Louis Meckel.   Pt seen by cardiology 06/16/2022. Per OV note, "Preop cardiovascular evaluation Acceptable risk for prolapsed bladder surgery next week No further cardiac testing needed"  Anticipate pt can proceed with planned procedure barring acute status change.   VS: BP 121/68   Pulse 61   Temp 36.5 C (Oral)   Resp 16   Ht _0  (1.549 m)   Wt 71.2 kg   LMP 10/18/2012   SpO2 98%   BMI 29.66 kg/m   PROVIDERS: Einar Pheasant, MD is PCP   Ida Rogue, MD is Cardiologist  LABS: Labs reviewed: Acceptable for surgery. (all labs ordered are listed, but only abnormal results are displayed)  Labs Reviewed  URINE CULTURE  TYPE AND SCREEN     IMAGES:   EKG:   CV: Echo 03/25/2018 - Left ventricle: The cavity size was normal. Wall thickness was    normal. Systolic function was normal. The estimated ejection    fraction was in the range of 50% to 55%. Wall motion was normal;    there were no regional wall motion abnormalities. Features are    consistent with a pseudonormal left ventricular filling pattern,    with concomitant abnormal relaxation and increased filling    pressure (grade 2 diastolic dysfunction).   Impressions:   - Normal LV systolic function; moderate diastolic dysfunction. Past Medical History:  Diagnosis Date   Allergy    Anemia    Arthritis    Atrial fibrillation ([place])    Cardiomyopathy, nonischemic ([place])    a.) felt to be tachycardia mediated NICM; b.) TTE 09/01/2015:  EF 35-40%; c.) [facility] 11/26/2015: ED 30-35%, norm cors, mPA 16, PCWP 7, CO 4.7, CI 2.8; d.) TTE 01/31/2016: EF 40-45%; e.) cMRI 04/27/2016: EF 48%; f.) TTE 12/06/2016: EF 55-60%; g.) TTE 03/25/2018: EF 50-55%   Carpal tunnel syndrome, bilateral    Chronic back pain    Congestive heart failure ([place])    a.) TTE 09/01/2015: EF 35-40%, mod MR; b.) R/LHC 11/26/2015: EF 30-35%; c.) TTE 01/31/2016: EF 40-45%, mild LVH, diffuse HK, triv MR/TR; d.) TTE 12/06/2016: EF 55-60%, mild MAC, G1DD; e.) TTE 03/25/2018: EF 50-55%, triv PR, G2DD.   Diverticulosis    Dyspnea    with exertion   Environmental and seasonal allergies    Female cystocele    GERD (gastroesophageal reflux disease)    Hiatal hernia    Hyperlipidemia    Hypertension    Hyperthyroidism    Migraines    NSVT (nonsustained ventricular tachycardia) ([place]) 11/16/2015   a.) felt to be related to NICM in the setting of increased stressors, influenza A, hypotension Tx'd with rapid 6L IVF bolus   Overactive detrusor    Personal history of colonic polyps    PONV (postoperative nausea and vomiting)    Skin cancer, basal cell    Squamous cell skin cancer    SUI (stress urinary incontinence, female)    SVT (supraventricular tachycardia) 08/31/2015   a.) rates as high as 150 bpm; b.) EP feels  as if event was "long RP tachycardia"   Uterovaginal prolapse    Vaginal atrophy     Past Surgical History:  Procedure Laterality Date   ANTERIOR AND POSTERIOR VAGINAL REPAIR W/ SACROSPINOUS LIGAMENT SUSPENSION  10/18/2012   bilateral trigger finger surgery      CARDIAC CATHETERIZATION N/A 11/26/2015   Procedure: Right/Left Heart Cath and Coronary Angiography;  Surgeon: Jolaine Artist, MD;  Location: [place] [facility];  Service: Cardiovascular;  Laterality: N/A;   carpal tunnel on right      CATARACT EXTRACTION Bilateral    CHOLECYSTECTOMY  2006   COLONOSCOPY WITH PROPOFOL N/A 03/19/2017   Procedure: COLONOSCOPY WITH PROPOFOL;  Surgeon: Lollie Sails, MD;  Location: [facility] ENDOSCOPY;  Service: Endoscopy;  Laterality: N/A;   COLPOPEXY  10/18/2012   CYSTOSCOPY WITH BIOPSY N/A 04/21/2022   Procedure: CYSTOSCOPY WITH BLADDER BIOPSY;  Surgeon: Billey Co, MD;  Location: [facility] ORS;  Service: Urology;  Laterality: N/A;   CYSTOURETHROSCOPY  10/18/2012   ESOPHAGOGASTRODUODENOSCOPY (EGD) WITH PROPOFOL N/A 10/03/2016   Procedure: ESOPHAGOGASTRODUODENOSCOPY (EGD) WITH PROPOFOL;  Surgeon: Lollie Sails, MD;  Location: [facility] ENDOSCOPY;  Service: Endoscopy;  Laterality: N/A;   HYSTEROSCOPY  2011, 10/2010   OOPHORECTOMY     REVERSE SHOULDER ARTHROPLASTY Right 06/17/2019   Procedure: REVERSE SHOULDER ARTHROPLASTY;  Surgeon: Corky Mull, MD;  Location: [facility] ORS;  Service: Orthopedics;  Laterality: Right;   right shoulder replacement      TONSILLECTOMY     TRIGGER FINGER RELEASE     TUBAL LIGATION     VAGINAL HYSTERECTOMY     midurethral sling    MEDICATIONS:  acetaminophen (TYLENOL) 500 MG tablet   acetaminophen (TYLENOL) 650 MG CR tablet   calcium carbonate (OSCAL) 1500 (600 Ca) MG TABS tablet   calcium carbonate (TUMS - DOSED IN MG ELEMENTAL CALCIUM) 500 MG chewable tablet   carvedilol (COREG) 12.5 MG tablet   Cholecalciferol 250 MCG (10000 UT) CAPS   fexofenadine (ALLEGRA) 180 MG tablet   furosemide (LASIX) 20 MG tablet   meloxicam (MOBIC) 15 MG tablet   methimazole (TAPAZOLE) 5 MG tablet   rosuvastatin (CRESTOR) 10 MG tablet   SUMAtriptan (IMITREX) 100 MG tablet   triamcinolone (NASACORT) 55 MCG/ACT AERO nasal inhaler   zonisamide (ZONEGRAN) 100 MG capsule   No current facility-administered medications for this encounter.     Konrad Felix Ward, PA-C [facility] [phone]

## 2022-06-21 ENCOUNTER — Ambulatory Visit (HOSPITAL_COMMUNITY): Payer: Medicare Other | Admitting: Physician Assistant

## 2022-06-21 ENCOUNTER — Telehealth: Payer: Self-pay

## 2022-06-21 ENCOUNTER — Other Ambulatory Visit: Payer: Self-pay

## 2022-06-21 ENCOUNTER — Ambulatory Visit (HOSPITAL_BASED_OUTPATIENT_CLINIC_OR_DEPARTMENT_OTHER): Payer: Medicare Other | Admitting: Anesthesiology

## 2022-06-21 ENCOUNTER — Encounter (HOSPITAL_COMMUNITY): Payer: Self-pay | Admitting: Urology

## 2022-06-21 ENCOUNTER — Observation Stay (HOSPITAL_COMMUNITY)
Admission: RE | Admit: 2022-06-21 | Discharge: 2022-06-22 | Disposition: A | Discharge status: Home / Self Care | Payer: Medicare Other | Attending: Urology | Admitting: Urology

## 2022-06-21 ENCOUNTER — Encounter (HOSPITAL_COMMUNITY)
Admission: RE | Disposition: A | Discharge status: Home / Self Care | Payer: Self-pay | Source: Home / Self Care | Attending: Urology

## 2022-06-21 DIAGNOSIS — I509 Heart failure, unspecified: Secondary | ICD-10-CM

## 2022-06-21 DIAGNOSIS — I5022 Chronic systolic (congestive) heart failure: Secondary | ICD-10-CM | POA: Diagnosis not present

## 2022-06-21 DIAGNOSIS — Z79899 Other long term (current) drug therapy: Secondary | ICD-10-CM | POA: Diagnosis not present

## 2022-06-21 DIAGNOSIS — Z8616 Personal history of COVID-19: Secondary | ICD-10-CM | POA: Diagnosis not present

## 2022-06-21 DIAGNOSIS — N814 Uterovaginal prolapse, unspecified: Secondary | ICD-10-CM

## 2022-06-21 DIAGNOSIS — Z01818 Encounter for other preprocedural examination: Secondary | ICD-10-CM

## 2022-06-21 DIAGNOSIS — Z96611 Presence of right artificial shoulder joint: Secondary | ICD-10-CM | POA: Insufficient documentation

## 2022-06-21 DIAGNOSIS — E0590 Thyrotoxicosis, unspecified without thyrotoxic crisis or storm: Secondary | ICD-10-CM

## 2022-06-21 DIAGNOSIS — N8110 Cystocele, unspecified: Secondary | ICD-10-CM

## 2022-06-21 DIAGNOSIS — N8111 Cystocele, midline: Secondary | ICD-10-CM | POA: Diagnosis not present

## 2022-06-21 DIAGNOSIS — I110 Hypertensive heart disease with heart failure: Secondary | ICD-10-CM

## 2022-06-21 DIAGNOSIS — N289 Disorder of kidney and ureter, unspecified: Secondary | ICD-10-CM | POA: Diagnosis not present

## 2022-06-21 HISTORY — PX: ROBOTIC ASSISTED LAPAROSCOPIC SACROCOLPOPEXY: SHX5388

## 2022-06-21 HISTORY — DX: Uterovaginal prolapse, unspecified: N81.4

## 2022-06-21 LAB — TYPE AND SCREEN
ABO/RH(D): O POS
Antibody Screen: NEGATIVE

## 2022-06-21 LAB — ABO/RH: ABO/RH(D): O POS

## 2022-06-21 LAB — HEMOGLOBIN AND HEMATOCRIT, BLOOD
HCT: 45.6 % (ref 36.0–46.0)
Hemoglobin: 14.3 g/dL (ref 12.0–15.0)

## 2022-06-21 SURGERY — SACROCOLPOPEXY, ROBOT-ASSISTED, LAPAROSCOPIC
Anesthesia: General

## 2022-06-21 MED ORDER — PHENYLEPHRINE 80 MCG/ML (10ML) SYRINGE FOR IV PUSH (FOR BLOOD PRESSURE SUPPORT)
PREFILLED_SYRINGE | INTRAVENOUS | Status: DC | PRN
Start: 2022-06-21 — End: 2022-06-21
  Administered 2022-06-21: 160 ug via INTRAVENOUS
  Administered 2022-06-21: 80 ug via INTRAVENOUS
  Administered 2022-06-21 (×2): 160 ug via INTRAVENOUS

## 2022-06-21 MED ORDER — DEXAMETHASONE SODIUM PHOSPHATE 4 MG/ML IJ SOLN
INTRAMUSCULAR | Status: DC | PRN
  Administered 2022-06-21: 10 mg via INTRAVENOUS

## 2022-06-21 MED ORDER — ORAL CARE MOUTH RINSE: 15.0000 mL | Freq: Once | OROMUCOSAL | Status: AC

## 2022-06-21 MED ORDER — FENTANYL CITRATE (PF) 100 MCG/2ML IJ SOLN
INTRAMUSCULAR | Status: DC | PRN
  Administered 2022-06-21 (×2): 50 ug via INTRAVENOUS
  Administered 2022-06-21: 100 ug via INTRAVENOUS
  Administered 2022-06-21: 50 ug via INTRAVENOUS

## 2022-06-21 MED ORDER — FENTANYL CITRATE (PF) 250 MCG/5ML IJ SOLN
INTRAMUSCULAR | Status: AC
  Filled 2022-06-21: qty 5

## 2022-06-21 MED ORDER — PHENYLEPHRINE HCL (PRESSORS) 10 MG/ML IV SOLN
INTRAVENOUS | Status: AC
  Filled 2022-06-21: qty 1

## 2022-06-21 MED ORDER — SODIUM CHLORIDE (PF) 0.9 % IJ SOLN
INTRAMUSCULAR | Status: AC
  Filled 2022-06-21: qty 20

## 2022-06-21 MED ORDER — DIPHENHYDRAMINE HCL 50 MG/ML IJ SOLN: 12.5000 mg | Freq: Four times a day (QID) | INTRAMUSCULAR | Status: DC | PRN

## 2022-06-21 MED ORDER — ESTRADIOL 0.1 MG/GM VA CREA
TOPICAL_CREAM | VAGINAL | Status: DC | PRN
  Administered 2022-06-21: 1 via VAGINAL

## 2022-06-21 MED ORDER — LACTATED RINGERS IV SOLN: INTRAVENOUS | Status: DC

## 2022-06-21 MED ORDER — ONDANSETRON HCL 4 MG/2ML IJ SOLN
INTRAMUSCULAR | Status: DC | PRN
  Administered 2022-06-21: 4 mg via INTRAVENOUS

## 2022-06-21 MED ORDER — HYDROMORPHONE HCL 1 MG/ML IJ SOLN
INTRAMUSCULAR | Status: AC
  Filled 2022-06-21: qty 1

## 2022-06-21 MED ORDER — ONDANSETRON HCL 4 MG/2ML IJ SOLN
4.0000 mg | INTRAMUSCULAR | Status: DC | PRN
  Filled 2022-06-21: qty 2

## 2022-06-21 MED ORDER — PROPOFOL 10 MG/ML IV BOLUS
INTRAVENOUS | Status: AC
  Filled 2022-06-21: qty 20

## 2022-06-21 MED ORDER — ESTRADIOL 0.1 MG/GM VA CREA: 1.0000 | TOPICAL_CREAM | VAGINAL | 1 refills | Status: DC

## 2022-06-21 MED ORDER — MIDAZOLAM HCL 2 MG/2ML IJ SOLN
INTRAMUSCULAR | Status: AC
  Filled 2022-06-21: qty 2

## 2022-06-21 MED ORDER — PROPOFOL 10 MG/ML IV BOLUS
INTRAVENOUS | Status: DC | PRN
  Administered 2022-06-21: 100 mg via INTRAVENOUS
  Administered 2022-06-21: 30 mg via INTRAVENOUS

## 2022-06-21 MED ORDER — BUPIVACAINE-EPINEPHRINE (PF) 0.5% -1:200000 IJ SOLN
INTRAMUSCULAR | Status: AC
  Filled 2022-06-21: qty 30

## 2022-06-21 MED ORDER — ACETAMINOPHEN 10 MG/ML IV SOLN
INTRAVENOUS | Status: AC
  Filled 2022-06-21: qty 100

## 2022-06-21 MED ORDER — DOCUSATE SODIUM 100 MG PO CAPS
100.0000 mg | ORAL_CAPSULE | Freq: Two times a day (BID) | ORAL | Status: DC
  Administered 2022-06-21 – 2022-06-22 (×2): 100 mg via ORAL
  Filled 2022-06-21 (×2): qty 1

## 2022-06-21 MED ORDER — MIDAZOLAM HCL 5 MG/5ML IJ SOLN
INTRAMUSCULAR | Status: DC | PRN
  Administered 2022-06-21: 2 mg via INTRAVENOUS

## 2022-06-21 MED ORDER — LACTATED RINGERS IV SOLN: INTRAVENOUS | Status: DC | PRN

## 2022-06-21 MED ORDER — SODIUM CHLORIDE 0.45 % IV SOLN: INTRAVENOUS | Status: DC

## 2022-06-21 MED ORDER — HYDROMORPHONE HCL 1 MG/ML IJ SOLN
0.2500 mg | INTRAMUSCULAR | Status: DC | PRN
  Administered 2022-06-21 (×2): 0.25 mg via INTRAVENOUS

## 2022-06-21 MED ORDER — FUROSEMIDE 10 MG/ML IJ SOLN
INTRAMUSCULAR | Status: AC
  Filled 2022-06-21: qty 2

## 2022-06-21 MED ORDER — DOCUSATE SODIUM 100 MG PO CAPS: 100.0000 mg | ORAL_CAPSULE | Freq: Two times a day (BID) | ORAL | Status: DC

## 2022-06-21 MED ORDER — BUPIVACAINE LIPOSOME 1.3 % IJ SUSP
INTRAMUSCULAR | Status: AC
  Filled 2022-06-21: qty 20

## 2022-06-21 MED ORDER — ROCURONIUM BROMIDE 10 MG/ML (PF) SYRINGE
PREFILLED_SYRINGE | INTRAVENOUS | Status: AC
  Filled 2022-06-21: qty 20

## 2022-06-21 MED ORDER — FLEET ENEMA 7-19 GM/118ML RE ENEM: 1.0000 | ENEMA | Freq: Once | RECTAL | Status: DC

## 2022-06-21 MED ORDER — MAGNESIUM CITRATE PO SOLN: 1.0000 | Freq: Once | ORAL | Status: DC

## 2022-06-21 MED ORDER — ACETAMINOPHEN 10 MG/ML IV SOLN
1000.0000 mg | Freq: Four times a day (QID) | INTRAVENOUS | Status: AC
  Administered 2022-06-21 – 2022-06-22 (×4): 1000 mg via INTRAVENOUS
  Filled 2022-06-21 (×3): qty 100

## 2022-06-21 MED ORDER — LACTATED RINGERS IR SOLN
Status: DC | PRN
  Administered 2022-06-21: 1000 mL

## 2022-06-21 MED ORDER — ROSUVASTATIN CALCIUM 10 MG PO TABS
10.0000 mg | ORAL_TABLET | Freq: Every day | ORAL | Status: DC
  Administered 2022-06-21 – 2022-06-22 (×2): 10 mg via ORAL
  Filled 2022-06-21 (×2): qty 1

## 2022-06-21 MED ORDER — ZONISAMIDE 100 MG PO CAPS
100.0000 mg | ORAL_CAPSULE | Freq: Every day | ORAL | Status: DC
  Administered 2022-06-21 – 2022-06-22 (×2): 100 mg via ORAL
  Filled 2022-06-21 (×2): qty 1

## 2022-06-21 MED ORDER — HYOSCYAMINE SULFATE 0.125 MG SL SUBL: 0.1250 mg | SUBLINGUAL_TABLET | SUBLINGUAL | Status: DC | PRN

## 2022-06-21 MED ORDER — PHENYLEPHRINE HCL-NACL 20-0.9 MG/250ML-% IV SOLN
INTRAVENOUS | Status: DC | PRN
  Administered 2022-06-21: 25 ug/min via INTRAVENOUS

## 2022-06-21 MED ORDER — BUPIVACAINE LIPOSOME 1.3 % IJ SUSP
INTRAMUSCULAR | Status: DC | PRN
  Administered 2022-06-21: 20 mL

## 2022-06-21 MED ORDER — ESTRADIOL 0.1 MG/GM VA CREA
TOPICAL_CREAM | VAGINAL | Status: AC
  Filled 2022-06-21: qty 42.5

## 2022-06-21 MED ORDER — LIDOCAINE 2% (20 MG/ML) 5 ML SYRINGE
INTRAMUSCULAR | Status: DC | PRN
  Administered 2022-06-21: 60 mg via INTRAVENOUS

## 2022-06-21 MED ORDER — LIDOCAINE HCL (PF) 2 % IJ SOLN
INTRAMUSCULAR | Status: AC
  Filled 2022-06-21: qty 5

## 2022-06-21 MED ORDER — CHLORHEXIDINE GLUCONATE 0.12 % MT SOLN
15.0000 mL | Freq: Once | OROMUCOSAL | Status: AC
  Administered 2022-06-21: 15 mL via OROMUCOSAL

## 2022-06-21 MED ORDER — PHENYLEPHRINE HCL (PRESSORS) 10 MG/ML IV SOLN: INTRAVENOUS | Status: DC | PRN

## 2022-06-21 MED ORDER — SUGAMMADEX SODIUM 200 MG/2ML IV SOLN
INTRAVENOUS | Status: DC | PRN
  Administered 2022-06-21: 200 mg via INTRAVENOUS

## 2022-06-21 MED ORDER — LIDOCAINE HCL (PF) 2 % IJ SOLN
INTRAMUSCULAR | Status: DC | PRN
  Administered 2022-06-21: 1.5 mg/kg/h via INTRADERMAL

## 2022-06-21 MED ORDER — KETOROLAC TROMETHAMINE 15 MG/ML IJ SOLN
15.0000 mg | Freq: Four times a day (QID) | INTRAMUSCULAR | Status: DC
  Administered 2022-06-21 – 2022-06-22 (×4): 15 mg via INTRAVENOUS
  Filled 2022-06-21 (×3): qty 1

## 2022-06-21 MED ORDER — HYDROMORPHONE HCL 1 MG/ML IJ SOLN
0.5000 mg | INTRAMUSCULAR | Status: DC | PRN
  Administered 2022-06-21: 1 mg via INTRAVENOUS
  Filled 2022-06-21: qty 1

## 2022-06-21 MED ORDER — FUROSEMIDE 10 MG/ML IJ SOLN
20.0000 mg | Freq: Once | INTRAMUSCULAR | Status: AC
  Administered 2022-06-21: 20 mg via INTRAVENOUS

## 2022-06-21 MED ORDER — KETOROLAC TROMETHAMINE 15 MG/ML IJ SOLN
INTRAMUSCULAR | Status: AC
  Filled 2022-06-21: qty 1

## 2022-06-21 MED ORDER — METHIMAZOLE 5 MG PO TABS
2.5000 mg | ORAL_TABLET | Freq: Every day | ORAL | Status: DC
  Administered 2022-06-21 – 2022-06-22 (×2): 2.5 mg via ORAL
  Filled 2022-06-21 (×2): qty 1

## 2022-06-21 MED ORDER — ROCURONIUM BROMIDE 100 MG/10ML IV SOLN
INTRAVENOUS | Status: DC | PRN
  Administered 2022-06-21: 70 mg via INTRAVENOUS
  Administered 2022-06-21: 30 mg via INTRAVENOUS
  Administered 2022-06-21: 20 mg via INTRAVENOUS

## 2022-06-21 MED ORDER — TRAMADOL HCL 50 MG PO TABS: 50.0000 mg | ORAL_TABLET | Freq: Four times a day (QID) | ORAL | 0 refills | Status: DC | PRN

## 2022-06-21 MED ORDER — BUPIVACAINE-EPINEPHRINE 0.5% -1:200000 IJ SOLN
INTRAMUSCULAR | Status: DC | PRN
  Administered 2022-06-21: 10 mL

## 2022-06-21 MED ORDER — TRAMADOL HCL 50 MG PO TABS: 50.0000 mg | ORAL_TABLET | Freq: Four times a day (QID) | ORAL | Status: DC | PRN

## 2022-06-21 MED ORDER — DIPHENHYDRAMINE HCL 12.5 MG/5ML PO ELIX: 12.5000 mg | ORAL_SOLUTION | Freq: Four times a day (QID) | ORAL | Status: DC | PRN

## 2022-06-21 MED ORDER — CEFAZOLIN SODIUM-DEXTROSE 2-4 GM/100ML-% IV SOLN
2.0000 g | INTRAVENOUS | Status: AC
  Administered 2022-06-21: 2 g via INTRAVENOUS
  Filled 2022-06-21: qty 100

## 2022-06-21 MED ORDER — CEPHALEXIN 500 MG PO CAPS: 500.0000 mg | ORAL_CAPSULE | Freq: Three times a day (TID) | ORAL | 0 refills | Status: DC

## 2022-06-21 MED ORDER — ONDANSETRON HCL 4 MG/2ML IJ SOLN
INTRAMUSCULAR | Status: AC
  Filled 2022-06-21: qty 2

## 2022-06-21 MED ORDER — AMISULPRIDE (ANTIEMETIC) 5 MG/2ML IV SOLN: 10.0000 mg | Freq: Once | INTRAVENOUS | Status: DC | PRN

## 2022-06-21 MED ORDER — LIDOCAINE HCL (CARDIAC) PF 50 MG/5ML IV SOSY: PREFILLED_SYRINGE | INTRAVENOUS | Status: DC | PRN

## 2022-06-21 MED ORDER — DEXAMETHASONE SODIUM PHOSPHATE 10 MG/ML IJ SOLN
INTRAMUSCULAR | Status: AC
  Filled 2022-06-21: qty 1

## 2022-06-21 MED ORDER — CARVEDILOL 12.5 MG PO TABS
12.5000 mg | ORAL_TABLET | Freq: Two times a day (BID) | ORAL | Status: DC
  Administered 2022-06-21 – 2022-06-22 (×2): 12.5 mg via ORAL
  Filled 2022-06-21 (×2): qty 1

## 2022-06-21 MED ORDER — PHENYLEPHRINE 80 MCG/ML (10ML) SYRINGE FOR IV PUSH (FOR BLOOD PRESSURE SUPPORT)
PREFILLED_SYRINGE | INTRAVENOUS | Status: AC
  Filled 2022-06-21: qty 10

## 2022-06-21 SURGICAL SUPPLY — 70 items
BAG COUNTER SPONGE SURGICOUNT (BAG) IMPLANT
BAG URINE DRAIN 2000ML AR STRL (UROLOGICAL SUPPLIES) IMPLANT
BRIEF MESH DISP LRG (UNDERPADS AND DIAPERS) IMPLANT
CATH FOLEY 2WAY SLVR  5CC 16FR (CATHETERS) ×1
CATH FOLEY 2WAY SLVR 5CC 16FR (CATHETERS) ×1 IMPLANT
CHLORAPREP W/TINT 26 (MISCELLANEOUS) ×1 IMPLANT
CLIP LIGATING HEM O LOK PURPLE (MISCELLANEOUS) ×1 IMPLANT
CLIP LIGATING HEMO LOK XL GOLD (MISCELLANEOUS) IMPLANT
CLIP LIGATING HEMO O LOK GREEN (MISCELLANEOUS) IMPLANT
COVER BACK TABLE 60X90IN (DRAPES) ×1 IMPLANT
COVER SURGICAL LIGHT HANDLE (MISCELLANEOUS) ×1 IMPLANT
COVER TIP SHEARS 8 DVNC (MISCELLANEOUS) ×1 IMPLANT
COVER TIP SHEARS 8MM DA VINCI (MISCELLANEOUS) ×1
DERMABOND ADVANCED .7 DNX12 (GAUZE/BANDAGES/DRESSINGS) ×1 IMPLANT
DERMABOND ADVANCED .7 DNX6 (GAUZE/BANDAGES/DRESSINGS) IMPLANT
DRAIN CHANNEL RND F F (WOUND CARE) IMPLANT
DRAPE ARM DVNC X/XI (DISPOSABLE) ×4 IMPLANT
DRAPE COLUMN DVNC XI (DISPOSABLE) ×1 IMPLANT
DRAPE DA VINCI XI ARM (DISPOSABLE) ×4
DRAPE DA VINCI XI COLUMN (DISPOSABLE) ×1
DRAPE INCISE IOBAN 66X45 STRL (DRAPES) ×1 IMPLANT
DRAPE SHEET LG 3/4 BI-LAMINATE (DRAPES) ×2 IMPLANT
DRAPE SURG IRRIG POUCH 19X23 (DRAPES) ×1 IMPLANT
ELECT PENCIL ROCKER SW 15FT (MISCELLANEOUS) ×1 IMPLANT
ELECT REM PT RETURN 15FT ADLT (MISCELLANEOUS) ×1 IMPLANT
GAUZE 4X4 16PLY ~~LOC~~+RFID DBL (SPONGE) IMPLANT
GLOVE BIO SURGEON STRL SZ 6.5 (GLOVE) ×1 IMPLANT
GLOVE SURG LX STRL 7.5 STRW (GLOVE) ×3 IMPLANT
GOWN SRG XL LVL 4 BRTHBL STRL (GOWNS) ×1 IMPLANT
GOWN STRL NON-REIN XL LVL4 (GOWNS) ×1
GOWN STRL REUS W/ TWL XL LVL3 (GOWN DISPOSABLE) ×2 IMPLANT
GOWN STRL REUS W/TWL XL LVL3 (GOWN DISPOSABLE) ×2
HOLDER FOLEY CATH W/STRAP (MISCELLANEOUS) ×1 IMPLANT
IRRIG SUCT STRYKERFLOW 2 WTIP (MISCELLANEOUS) ×1
IRRIGATION SUCT STRKRFLW 2 WTP (MISCELLANEOUS) ×1 IMPLANT
KIT BASIN OR (CUSTOM PROCEDURE TRAY) ×1 IMPLANT
KIT TURNOVER KIT A (KITS) IMPLANT
MANIPULATOR UTERINE 4.5 ZUMI (MISCELLANEOUS) IMPLANT
MARKER SKIN DUAL TIP RULER LAB (MISCELLANEOUS) ×1 IMPLANT
MESH Y UPSYLON VAGINAL (Mesh General) IMPLANT
OCCLUDER COLPOPNEUMO (BALLOONS) IMPLANT
PACKING VAGINAL (PACKING) IMPLANT
PAD OB MATERNITY 4.3X12.25 (PERSONAL CARE ITEMS) IMPLANT
PAD POSITIONING PINK XL (MISCELLANEOUS) ×1 IMPLANT
SEAL CANN UNIV 5-8 DVNC XI (MISCELLANEOUS) ×4 IMPLANT
SEAL XI 5MM-8MM UNIVERSAL (MISCELLANEOUS) ×4
SET IRRIG Y TYPE TUR BLADDER L (SET/KITS/TRAYS/PACK) IMPLANT
SET TUBE SMOKE EVAC HIGH FLOW (TUBING) ×1 IMPLANT
SHEET LAVH (DRAPES) ×1 IMPLANT
SOL PREP POV-IOD 4OZ 10% (MISCELLANEOUS) IMPLANT
SOLUTION ELECTROLUBE (MISCELLANEOUS) ×1 IMPLANT
SURGILUBE 2OZ TUBE FLIPTOP (MISCELLANEOUS) IMPLANT
SUT MNCRL AB 4-0 PS2 18 (SUTURE) ×2 IMPLANT
SUT PROLENE 2 0 CT 1 (SUTURE) ×1 IMPLANT
SUT VIC AB 0 CT1 27 (SUTURE) ×1
SUT VIC AB 0 CT1 27XBRD ANTBC (SUTURE) ×1 IMPLANT
SUT VIC AB 2-0 SH 27 (SUTURE) ×7
SUT VIC AB 2-0 SH 27XBRD (SUTURE) ×5 IMPLANT
SUT VIC AB 3-0 SH 27 (SUTURE)
SUT VIC AB 3-0 SH 27X BRD (SUTURE) IMPLANT
SUT VICRYL 0 UR6 27IN ABS (SUTURE) ×1 IMPLANT
SUT VLOC 180 3-0 9IN GS21 (SUTURE) IMPLANT
SYR 50ML LL SCALE MARK (SYRINGE) IMPLANT
SYS BAG RETRIEVAL 10MM (BASKET)
SYSTEM BAG RETRIEVAL 10MM (BASKET) IMPLANT
TOWEL OR 17X26 10 PK STRL BLUE (TOWEL DISPOSABLE) ×1 IMPLANT
TRAY LAPAROSCOPIC (CUSTOM PROCEDURE TRAY) ×1 IMPLANT
TROCAR Z THREAD OPTICAL 12X100 (TROCAR) ×1 IMPLANT
TROCAR Z-THREAD FIOS 5X100MM (TROCAR) ×1 IMPLANT
WATER STERILE IRR 1000ML POUR (IV SOLUTION) ×1 IMPLANT

## 2022-06-21 NOTE — Telephone Encounter (Signed)
See note.  Please clarify how often she is taking this medication.

## 2022-06-21 NOTE — H&P (Signed)
71 year old female presents today for discussion of robotic sacrocolpopexy.   The patient had a hysterectomy and bladder tack 7 or 8 years ago. Since that time she has noted worsening prolapse of her vagina. She has been evaluated by Dr. Matilde Sprang and noted to have significant shortening of the vagina with her central defect bulging down to the introitus. She had urodynamics that demonstrated overactive bladder but no evidence of stress urinary incontinence. She has small capacity bladder as well.   The patient has significant soreness and pain after standing and working for long periods of time. She will sometimes reduce her bulge in the shower. She is recently widowed, and has a lot of chores around the house 210 2. She would like to have her cystocele repaired so that she can move on. She is not currently sexually active.   The patient does have severe urinary urgency and has urge incontinence daily. Medication has not been terribly effective for her, but she has been reluctant to take it with her Lasix. She has very mild stress urinary incontinence. She wears 1-3 pads daily.   The patient does have a cardiac history with A-fib and some mild heart failure. She has no history of diabetes. She had her gallbladder removed for 5 years ago. She had a total shoulder as well as a transvaginal hysterectomy and bladder tack.     ALLERGIES: No Allergies    MEDICATIONS: Allegra 180 mg tablet Oral  Calcium 600 mg calcium (1,500 mg) tablet Oral  Casanthranol POWD Does Not Apply  Cranberry TABS Oral  Erythromycin 5 mg/gram (0.5 %) ointment Ophthalmic  Estrace 0.01 % cream with applicator 0 Vaginal  Estradiol 0.5 mg tablet Oral  Imitrex 100 mg tablet Oral  Nitrofurantoin 100 mg capsule 1 Oral  Probiotic CAPS Oral  Simvastatin 10 mg tablet Oral  Toviaz 4 mg tablet, extended release 24 hr 0 Oral  Vitamin D3 50 mcg (2,000 unit) tablet Oral     GU PSH: Complex cystometrogram, w/ void pressure and  urethral pressure profile studies, any technique - 12/30/2021 Complex Uroflow - 12/30/2021 Emg surf Electrd - 12/30/2021 Hysterectomy Unilat SO - November 25, 2013 Inject For cystogram - 12/30/2021 Intrabd voidng Press - 12/30/2021       PSH Notes: Tonsillectomy, Tubal Ligation, Hysterectomy, Vaginal Surgery, Gallbladder Surgery, Bladder Surgery   NON-GU PSH: Remove Tonsils - 11-25-13 Tubal Ligation - 2013/11/25     GU PMH: Urge incontinence - 12/30/2021, Urge incontinence of urine, - 2014/11/26 Urinary Tract Inf, Unspec site, Urinary tract infection - 11-26-2014 Chronic cystitis (w/o hematuria), Chronic cystitis - 11-26-2014 Cystocele, midline, Cystocele, midline - 11/26/14 Nocturia, Nocturia - 11-25-13, Nocturia, - 25-Nov-2013 Other microscopic hematuria, Microscopic hematuria - 11/25/13 Urinary incontinence, Unspec, Urinary incontinence - 11-25-13    NON-GU PMH: Encounter for general adult medical examination without abnormal findings, Encounter for preventive health examination - 25-Nov-2013 Personal history of other diseases of the musculoskeletal system and connective tissue, History of arthritis - 2013-11-25 Personal history of other endocrine, nutritional and metabolic disease, History of hypercholesterolemia - November 25, 2013    FAMILY HISTORY: Death of family member - Runs In Family Diabetes - Runs In Family heart failure - Runs In Family Obstructive sleep apnea, adult - Runs In Family Strokes - Runs In Family   SOCIAL HISTORY: No Social History     Notes: Number of children, Occupation, Caffeine use, Alcohol use, Married, Non-smoker   VITAL SIGNS:      01/09/2022 12:30 PM  Weight 154 lb / 69.85 kg  Height 61 in / 154.94 cm  BP 126/83 mmHg  Pulse 67 /min  Temperature 96.8 F / 36 C  BMI 29.1 kg/m   MULTI-SYSTEM PHYSICAL EXAMINATION:    Constitutional: Well-nourished. No physical deformities. Normally developed. Good grooming.  Neck: Neck symmetrical, not swollen. Normal tracheal position.  Respiratory: No labored breathing, no use of accessory muscles.    Cardiovascular: Normal temperature, normal extremity pulses, no swelling, no varicosities.  Lymphatic: No enlargement of neck, axillae, groin.  Skin: No paleness, no jaundice, no cyanosis. No lesion, no ulcer, no rash.  Neurologic / Psychiatric: Oriented to time, oriented to place, oriented to person. No depression, no anxiety, no agitation.  Gastrointestinal: No mass, no tenderness, no rigidity, non obese abdomen.  Eyes: Normal conjunctivae. Normal eyelids.  Ears, Nose, Mouth, and Throat: Left ear no scars, no lesions, no masses. Right ear no scars, no lesions, no masses. Nose no scars, no lesions, no masses. Normal hearing. Normal lips.  Musculoskeletal: Normal gait and station of head and neck.     Complexity of Data:  Source Of History:  Patient  Records Review:   Previous Doctor Records, Previous Patient Records  Urine Test Review:   Urinalysis  Urodynamics Review:   Review Urodynamics Tests   PROCEDURES: None   ASSESSMENT:      ICD-10 Details  1 GU:   Cystocele, midline - N81.11    PLAN:           Document Letter(s):  Created for Patient: Clinical Summary         Notes:   I went over robotic-assisted laparoscopic sacral colpopexy with the patient detail. I explained to the patient the rationale for the surgery. I also went over the placement of the laparoscopic ports. I detailed to her the surgery as well as the postoperative recovery time. I explained to the patient that she could expect to be in the hospital at least one or 2 nights. She will require 4 weeks of no heavy lifting, 6 weeks of no bending or twisting. She will not be able to use her vagina for 6 weeks. I discussed complications of the operation including injury to bowel, ureters, bladder. We also discussed the risk of failure as well as the complications of mesh. I explained to them the difference between transvaginal mesh and the mesh used for sacral colpopexy. I reassured them that there has not been an FDA  warnings in regards to sacral colpopexy mesh. We will plan to get this prior to her surgery. I spent 45 minutes with the patient going over that ins and outs of the surgery and answering all her questions.

## 2022-06-21 NOTE — Transfer of Care (Signed)
Immediate Anesthesia Transfer of Care Note  Patient: Kristi Greer  Procedure(s) Performed: XI ROBOTIC ASSISTED LAPAROSCOPIC SACROCOLPOPEXY  Patient Location: PACU  Anesthesia Type:General  Level of Consciousness: drowsy  Airway & Oxygen Therapy: Patient Spontanous Breathing and Patient connected to face mask oxygen  Post-op Assessment: Report given to RN and Post -op Vital signs reviewed and stable  Post vital signs: Reviewed and stable  Last Vitals:  Vitals Value Taken Time  BP 118/64 06/21/22 1055  Temp 36.6 C 06/21/22 1056  Pulse 64 06/21/22 1057  Resp 8 06/21/22 1057  SpO2 96 % 06/21/22 1057  Vitals shown include unvalidated device data.  Last Pain:  Vitals:   06/21/22 0627  TempSrc:   PainSc: 0-No pain      Patients Stated Pain Goal: 3 (43/56/86 1683)  Complications: No notable events documented.

## 2022-06-21 NOTE — Discharge Instructions (Signed)

## 2022-06-21 NOTE — Anesthesia Postprocedure Evaluation (Signed)
Anesthesia Post Note  Patient: Kristi Greer  Procedure(s) Performed: XI ROBOTIC ASSISTED LAPAROSCOPIC SACROCOLPOPEXY     Patient location during evaluation: PACU Anesthesia Type: General Level of consciousness: sedated and patient cooperative Pain management: pain level controlled Vital Signs Assessment: post-procedure vital signs reviewed and stable Respiratory status: spontaneous breathing Cardiovascular status: stable Anesthetic complications: no   No notable events documented.  Last Vitals:  Vitals:   06/21/22 1445 06/21/22 1545  BP: 100/63 108/60  Pulse: 65 65  Resp: 12 12  Temp:  36.5 C  SpO2: 100% 98%    Last Pain:  Vitals:   06/21/22 1445  TempSrc:   PainSc: 0-No pain                 Nolon Nations

## 2022-06-21 NOTE — Interval H&P Note (Signed)
History and Physical Interval Note:  06/21/2022 7:26 AM  Kristi Greer  has presented today for surgery, with the diagnosis of CYSTOCELE.  The various methods of treatment have been discussed with the patient and family. After consideration of risks, benefits and other options for treatment, the patient has consented to  Procedure(s) with comments: XI ROBOTIC ASSISTED LAPAROSCOPIC SACROCOLPOPEXY (N/A) - 240 MINUTES as a surgical intervention.  The patient's history has been reviewed, patient examined, no change in status, stable for surgery.  I have reviewed the patient's chart and labs.  Questions were answered to the patient's satisfaction.     [name]

## 2022-06-21 NOTE — Op Note (Signed)
Preoperative diagnosis:  cystocele   Postoperative diagnosis:  same   Procedure: Robotic sacrocolpopexy  Surgeon: [name], MD  Anesthesia: General  Complications: None  Intraoperative findings:  #1- Transvaginal mesh encountered from previous cystocele repair 6cm from apex, the old mesh and the new mesh placed from the apex were sutured together. #2 - mesh was cut to 6cm on top and bottom.  EBL: 50cc  Specimens: None  Indication: Kristi Greer is a 71 y.o. female patient with symptomatic pelvic organ prolapse.  After reviewing the management options for treatment, he elected to proceed with the above surgical procedure(s). We have discussed the potential benefits and risks of the procedure, side effects of the proposed treatment, the likelihood of the patient achieving the goals of the procedure, and any potential problems that might occur during the procedure or recuperation. Informed consent has been obtained.  Description of procedure:  A Foley catheter was then placed and placed to gravity drainage. I then made a periumbilical incision carrying the dissection down to the patient's fascia with electrocautery.  Once to the fascia, the fascia was incised the peritoneum opened.  A 24m port was then placed into the abdomen.  The abdomen was insufflated and the remaining ports placed under digital guidance.  2 ports were placed lateral to the umbilicus on the right proximally 10 cm apart.  The most lateral port was approximately 3 cm above the anterior iliac spine.  2 additional ports were placed in the patient's right side in comparable positions to the most lateral port on the right was a 12 mm port.the robot was then docked at an angle from the leg obliquely along the side of the left leg.  We then began our surgery by cleaning up some of the pelvic adhesions to the small bowel and colon.  Once this was completed I started dissecting at the sacral promontory located 3  cm medial to the location where the ureter crosses over the iliac vessels at the pelvic brim. The posterior peritoneum was incised and the sacral prominence cleared off an area taking care to avoid the middle sacral vessels and the iliac branches.  I then created a posterior peritoneal tunnel starting at the sacral promontory and tunneling down the right pelvic sidewall down into the pelvis breaking back through the posterior peritoneum around the vesico-vaginal junction posteriorly.  I then continued the posterior dissection retracting down on the rectum and finding the avascular plane between the posterior vaginal wall and the rectum.  I carried this dissection down as far as I could to along the area of the perineal body.  I then turned my attention to the anterior plane between the anterior vaginal wall and the bladder.  I was able to obtain access to the avascular plane and with a combination of both monopolar cautery and blunt dissection was able to clean and nice down to the bladder neck.  I then turned my attention back to the patient's uterus and skeletonized the right uterine artery and vein and then took this with a series of bipolar moves.  I then performed a similar uterus pedicle ligation on the left.    Mesh was measured at approximately 6cm anteriorly and 6cm posteriorly and I cut this on the back table.  The mesh was then placed into the patient's abdomen through the assistant port and the anterior leaf was secured down onto the anterior vaginal wall with the apex at the bladder neck.  The posterior leaf was  then secured down on the posterior vaginal wall.  These were sewn down with 2-0 Vicryl.  Between 6 and 8 were done on each side.  At this point I then went back to the previously dissected sacral promontory and posterior peritoneal tunnel and inserted a instrument through the tunnel and grasped the end of the mesh at the vaginal cuff and pull it up to the sacrum.  I then checked to ensure that  the sacral mesh was not too tight by performing a vaginal exam.  I then secured the sacral leg of the mesh using a 0 Prolene.  I then reapproximated the posterior peritoneum with a 2-0 Vicryl in a running fashion around the sacral promontory.  The pelvic peritoneum was closed using a pursestring.   The fascia of the 12 mm port was then closed with 0 Vicryl in a figure-of-eight fashion.  The skin was closed with 4-0 Monocryl's.  Dermabond was applied to the incision and exparel injected into the incisions.  Estrace impregnated packing was then placed into her vagina which will be left in overnight.  The patient was subsequently extubated and returned to the PACU in excellent condition.   [name], M.D.

## 2022-06-21 NOTE — Telephone Encounter (Signed)
Called pt but her daughter picked up instead due to pt being in surgery, she was waiting for pt in the lobby.  I called pt in regards to a Rx refill request for sumatriptan Dr. Nicki Reaper wanted clarification on how often she is taking the medication before refilling it.   Pts daughter stated she will probably give Kristi Greer a call back on tomorrow letting Kristi Greer know

## 2022-06-21 NOTE — Anesthesia Procedure Notes (Signed)
Procedure Name: Intubation Date/Time: 06/21/2022 7:42 AM  Performed by: Lieutenant Diego, CRNAPre-anesthesia Checklist: Patient identified, Emergency Drugs available, Suction available and Patient being monitored Patient Re-evaluated:Patient Re-evaluated prior to induction Oxygen Delivery Method: Circle system utilized Preoxygenation: Pre-oxygenation with 100% oxygen Induction Type: IV induction Ventilation: Mask ventilation without difficulty Laryngoscope Size: 2 and Miller Grade View: Grade I Tube type: Oral Tube size: 7.0 mm Number of attempts: 1 Airway Equipment and Method: Stylet Placement Confirmation: ETT inserted through vocal cords under direct vision, positive ETCO2 and breath sounds checked- equal and bilateral Secured at: 22 cm Tube secured with: Tape Dental Injury: Teeth and Oropharynx as per pre-operative assessment

## 2022-06-22 ENCOUNTER — Encounter (HOSPITAL_COMMUNITY): Payer: Self-pay | Admitting: Urology

## 2022-06-22 DIAGNOSIS — Z8616 Personal history of COVID-19: Secondary | ICD-10-CM | POA: Diagnosis not present

## 2022-06-22 DIAGNOSIS — I5022 Chronic systolic (congestive) heart failure: Secondary | ICD-10-CM | POA: Diagnosis not present

## 2022-06-22 DIAGNOSIS — Z79899 Other long term (current) drug therapy: Secondary | ICD-10-CM | POA: Diagnosis not present

## 2022-06-22 DIAGNOSIS — N8111 Cystocele, midline: Secondary | ICD-10-CM | POA: Diagnosis not present

## 2022-06-22 DIAGNOSIS — Z96611 Presence of right artificial shoulder joint: Secondary | ICD-10-CM | POA: Diagnosis not present

## 2022-06-22 LAB — BASIC METABOLIC PANEL
Anion gap: 5 (ref 5–15)
BUN: 16 mg/dL (ref 8–23)
CO2: 26 mmol/L (ref 22–32)
Calcium: 8.7 mg/dL — ABNORMAL LOW (ref 8.9–10.3)
Chloride: 110 mmol/L (ref 98–111)
Creatinine, Ser: 0.94 mg/dL (ref 0.44–1.00)
GFR, Estimated: >60 mL/min (ref >60)
Glucose, Bld: 130 mg/dL — ABNORMAL HIGH (ref 70–99)
Potassium: 4.1 mmol/L (ref 3.5–5.1)
Sodium: 141 mmol/L (ref 135–145)

## 2022-06-22 LAB — HEMOGLOBIN AND HEMATOCRIT, BLOOD
HCT: 39.6 % (ref 36.0–46.0)
Hemoglobin: 12.5 g/dL (ref 12.0–15.0)

## 2022-06-22 MED ORDER — ORAL CARE MOUTH RINSE: 15.0000 mL | OROMUCOSAL | Status: DC | PRN

## 2022-06-22 NOTE — Plan of Care (Signed)

## 2022-06-22 NOTE — Discharge Summary (Signed)
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   Pain of left heel 05/03/2019   Immunization counseling 05/03/2019   Right shoulder pain 12/21/2018   Leukocytosis 12/21/2018   Hyperglycemia 12/21/2018   Hyperthyroidism 06/21/2018   Pain of right heel 05/04/2018   Snoring 01/06/2018   Impingement syndrome of shoulder region 09/17/2017   GERD (gastroesophageal reflux disease) 06/24/2017   Bilateral carpal tunnel syndrome 11/29/2016   Carpal tunnel syndrome 11/08/2016   Sinusitis 08/13/2016   Epigastric abdominal pain 05/04/2016   Left sided chest pain 12/05/2015   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HCC)    NSVT (nonsustained ventricular tachycardia) ([place])    Environmental allergies 15/17/6160   Chronic systolic heart failure ([place]) 09/22/2015   Hypotension 09/22/2015   SVT (supraventricular tachycardia) 09/01/2015   DOE (dyspnea on exertion) 08/10/2015   Chronic migraine without aura without status migrainosus, not intractable 06/24/2015   Health care maintenance 04/13/2015   Urinary tract infection without hematuria 08/22/2013   History of migraine headaches 11/11/2012   Hypercholesterolemia 11/11/2012   History of colonic polyps 11/11/2012   Migraines 10/02/2012   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 Briefly, Kristi Greer is a 71 y.o. year old patient with symptomatic prolapse of her bladder.   Hospital Course: Patient tolerated the procedure well.  She was then transferred to the floor after an uneventful PACU stay.  Her hospital course was uncomplicated.  On POD#1 she had met discharge criteria: was eating a regular diet, was up and ambulating independently,  pain was well controlled, was voiding without a catheter, and was ready to for discharge.  NAD Vitals:   06/21/22 1613 06/21/22 2103 06/22/22 0039 06/22/22 0509  BP: (!) 104/55 (!) 102/57 107/89 (!) 103/56  Pulse: 66 71 71 61  Resp: _0 Temp: 97.8 F (36.6 C) 98.2 F (36.8 C) 97.7 F (36.5 C) 97.6 F (36.4 C)  TempSrc: Oral Oral Oral Oral  SpO2: 97% 98% 94% 99%  Weight:      Height:        Intake/Output Summary (Last 24 hours) at 06/22/2022 0735 Last data filed at 06/22/2022 7371 Gross per 24 hour  Intake 2489.05 ml  Output 1575 ml  Net 914.05 ml   NAD Non-labored breathing Abdomen is soft - bruising around incisions Ext symmetric  Laboratory values:  Recent Labs    06/21/22 1105 06/22/22 0429  HGB 14.3 12.5  HCT 45.6 39.6   Recent Labs    06/22/22 0429  NA 141  K 4.1  CL 110  CO2 26  GLUCOSE 130*  BUN 16  CREATININE 0.94  CALCIUM 8.7*   No results for input(s): "LABPT", "INR" in the last 72  hours. No results for input(s): "LABURIN" in the last 72 hours. Results for orders placed or performed during the hospital encounter of 06/13/22  Urine Culture     Status: None   Collection Time: 06/13/22 10:50 AM   Specimen: Urine, Clean Catch  Result Value Ref Range Status   Specimen Description   Final    URINE, CLEAN CATCH Performed at [facility], [place] [address]  [place], [place] [address]    Special Requests   Final    NONE Performed at [facility], [place] [address], [place], [place] [address]    Culture   Final    NO GROWTH Performed at [facility], [place] [address], [place], [place] [address]    Report Status 06/14/2022 FINAL  Final    Disposition: Home  Discharge instruction: The patient was instructed to be ambulatory but told to refrain from heavy lifting, strenuous activity, or driving.   Discharge medications:  Allergies as of 06/22/2022   No Known Allergies      Medication List     STOP taking these medications    calcium carbonate 1500 (600 Ca) MG Tabs tablet Commonly known as: OSCAL   Cholecalciferol 250 MCG (10000 UT) Caps   meloxicam 15 MG tablet Commonly known as: MOBIC       TAKE these medications    acetaminophen 500 MG tablet Commonly known as: TYLENOL Take 500-1,000 mg by mouth every 6 (six) hours as needed for moderate pain or headache.   acetaminophen 650 MG CR tablet Commonly known as: TYLENOL Take 650 mg by mouth 2 (two) times daily with a meal.   calcium carbonate 500 MG chewable tablet Commonly known as: TUMS - dosed in mg elemental calcium Chew 1 tablet by mouth daily as needed for indigestion or heartburn.   carvedilol 12.5 MG tablet Commonly known as: COREG TAKE 1 TABLET (12.5MG TOTAL) BY MOUTH TWICE A DAY WITH MEALS   cephALEXin 500 MG capsule Commonly known as: KEFLEX Take 1 capsule (500 mg total) by mouth 3 (three) times daily.   docusate sodium 100 MG capsule Commonly known as: COLACE Take 1 capsule (100 mg total) by mouth 2 (two) times daily.   estradiol 0.1 MG/GM vaginal cream Commonly known as: ESTRACE VAGINAL Place 1 Applicatorful vaginally 3 (three) times a week. Use 1 small dolyp of cream on tip of index finger and swap the inside of the vagina   fexofenadine 180 MG tablet Commonly known as: ALLEGRA Take 180 mg by mouth at bedtime.   furosemide 20 MG  tablet Commonly known as: LASIX TAKE 1 TABLET (20 MG TOTAL) BY MOUTH EVERY OTHER DAY. What changed:  when to take this reasons to take this   methimazole 5 MG tablet Commonly known as: TAPAZOLE Take 0.5 tablets (2.5 mg total) by mouth daily.   rosuvastatin 10 MG tablet Commonly known as: CRESTOR TAKE 1 TABLET BY MOUTH EVERY DAY   SUMAtriptan 100 MG tablet Commonly known as: IMITREX MAY REPEAT IN 2 HOURS IF HEADACHE PERSISTS OR RECURS. What changed: See the new instructions.   traMADol 50 MG tablet Commonly known as: Ultram Take 1-2 tablets (50-100 mg total) by mouth every 6 (six) hours as needed for moderate pain or severe pain.   triamcinolone 55 MCG/ACT Aero nasal inhaler Commonly known as: NASACORT Place 1 spray into the nose daily as needed (allergies).   zonisamide 100 MG capsule Commonly known as: ZONEGRAN TAKE 1 CAPSULE BY MOUTH EVERY  DAY        Followup:   Follow-up Information     Hollace Hayward, NP Follow up on 07/05/2022.   Why: For wound re-check - 10AM Contact information: [place]. [place] [address] [phone]

## 2022-06-22 NOTE — Progress Notes (Signed)
AVS and discharge instructions reviewed w/ patient and daughter at the bedside. Patient voided before discharge, got IV tylenol per MD's request, and tolerated a regular diet meal. Patient verbalized readiness for discharge. Patient and daughter had no further questions. Daughter is the patient's transportation to home.

## 2022-06-22 NOTE — Plan of Care (Signed)
  Problem: Activity: Goal: Risk for activity intolerance will decrease Outcome: Progressing   Problem: Pain Managment: Goal: General experience of comfort will improve Outcome: Progressing   

## 2022-06-22 NOTE — Telephone Encounter (Signed)
Waiting to hear back about medication before refilling.

## 2022-06-22 NOTE — Progress Notes (Signed)
Pt vaginal packing and foley D/C'd per orders

## 2022-06-22 NOTE — Telephone Encounter (Signed)
Spoke with Pts daughter yesterday she was in the lobby pt stated she was in surgery and would try to call us back to let us know about the medication.

## 2022-06-23 ENCOUNTER — Encounter: Payer: Self-pay | Admitting: Internal Medicine

## 2022-06-28 ENCOUNTER — Telehealth: Payer: Self-pay | Admitting: Internal Medicine

## 2022-06-28 NOTE — Telephone Encounter (Signed)
Please call and notify Ms Mancebo that I did message Dr Louis Meckel and he informed me she could restart her calcium.

## 2022-06-28 NOTE — Telephone Encounter (Signed)
Pt advised.

## 2022-07-05 DIAGNOSIS — N8111 Cystocele, midline: Secondary | ICD-10-CM | POA: Diagnosis not present

## 2022-07-12 ENCOUNTER — Other Ambulatory Visit: Payer: Self-pay | Admitting: Family

## 2022-08-01 ENCOUNTER — Ambulatory Visit (INDEPENDENT_AMBULATORY_CARE_PROVIDER_SITE_OTHER): Payer: Medicare Other | Admitting: Internal Medicine

## 2022-08-01 ENCOUNTER — Encounter: Payer: Self-pay | Admitting: Internal Medicine

## 2022-08-01 VITALS — BP 120/74 | HR 74 | Ht 61.0 in | Wt 162.4 lb

## 2022-08-01 DIAGNOSIS — E0590 Thyrotoxicosis, unspecified without thyrotoxic crisis or storm: Secondary | ICD-10-CM

## 2022-08-01 DIAGNOSIS — N8111 Cystocele, midline: Secondary | ICD-10-CM | POA: Diagnosis not present

## 2022-08-01 LAB — T4, FREE: Free T4: 0.65 ng/dL (ref 0.60–1.60)

## 2022-08-01 LAB — T3, FREE: T3, Free: 4.2 pg/mL (ref 2.3–4.2)

## 2022-08-01 LAB — TSH: TSH: 1.23 u[IU]/mL (ref 0.35–5.50)

## 2022-08-01 NOTE — Patient Instructions (Signed)
Please continue Methimazole 2.5 mg daily.  Please stop at the lab.  You should have an endocrinology follow-up appointment in 1 year.

## 2022-08-01 NOTE — Progress Notes (Unsigned)
[name], female   DOB: 06-04-1951, 71 y.o.   MRN: [id number]  HPI  Kristi Greer is a 71 y.o.-year-old female, returning for follow-up for thyrotoxicosis.  She previously saw Dr. Loanne Drilling, but last visit with me 6 months ago.  Interim history: At today's visit, patient is feeling well without new symptoms. She was more sedentary in the last few months after her cystocele surgery and feels that she gained some weight.  Reviewed and addended history: She was diagnosed with thyrotoxicosis in 2015, when she presented with SVT.  She also has a history of atrial fibrillation, nonischemic cardiomyopathy, and CHF.  She was previously on medication for few months, but unclear which medication.  She declined RAI treatment per review of Dr. Cordelia Pen notes.  At our first visit in 12/2021, she was on methimazole 5 mg daily >> we decreased the dose to 2.5 mg daily.    I reviewed pt's thyroid tests: Lab Results  Component Value Date   TSH 2.27 03/02/2022   TSH 4.50 01/20/2022   TSH 2.03 06/07/2021   TSH 1.51 01/28/2021   TSH 2.30 05/11/2020   TSH 0.79 09/30/2019   TSH 1.48 04/25/2019   TSH 3.50 12/16/2018   TSH 4.31 09/24/2018   TSH 0.62 07/19/2018   TSH <0.01 (L) 04/30/2018   TSH <0.010 (L) 03/25/2018   TSH 0.01 (L) 12/10/2017   TSH 0.04 (L) 08/10/2017   TSH 0.03 (L) 06/22/2017   TSH 0.55 05/23/2016   TSH 0.46 11/25/2015   TSH 0.180 (L) 09/01/2015   TSH 0.11 (L) 05/21/2015   TSH 0.12 (L) 04/05/2015   TSH 0.09 (L) 08/06/2014   TSH 0.06 (L) 07/07/2014   TSH 0.069 (L) 07/03/2014   TSH 0.17 (L) 04/24/2014   TSH 0.02 (L) 12/03/2013   TSH 0.19 (L) 10/20/2013   TSH 0.14 (L) 09/17/2013   Lab Results  Component Value Date   FREET4 0.65 03/02/2022   FREET4 0.68 01/20/2022   FREET4 0.62 06/07/2021   FREET4 0.60 01/28/2021   FREET4 0.56 (L) 09/30/2019   FREET4 0.76 04/25/2019   FREET4 0.72 09/24/2018   FREET4 0.50 (L) 07/19/2018   FREET4 0.82 04/30/2018    FREET4 0.88 03/25/2018   FREET4 0.77 12/10/2017   FREET4 0.76 08/10/2017   FREET4 0.88 06/22/2017   FREET4 0.91 11/25/2015   FREET4 0.71 05/21/2015   FREET4 0.64 04/05/2015   FREET4 0.68 08/06/2014   FREET4 0.79 07/07/2014   FREET4 0.73 04/24/2014   FREET4 0.91 12/03/2013   FREET4 0.68 10/20/2013   FREET4 0.71 09/17/2013   Lab Results  Component Value Date   T3FREE 2.8 03/02/2022   T3FREE 3.0 01/20/2022   T3FREE 3.4 09/24/2018   T3FREE 2.9 07/19/2018   T3FREE 4.3 (H) 04/30/2018   T3FREE 3.8 12/10/2017   T3FREE 3.7 08/10/2017   T3FREE 3.9 06/22/2017   T3FREE 3.4 11/25/2015   T3FREE 3.5 09/02/2015   T3FREE 3.7 05/21/2015   T3FREE 3.1 04/05/2015   T3FREE 3.7 08/06/2014   T3FREE 3.3 07/07/2014   T3FREE 3.0 04/24/2014   T3FREE 3.2 12/03/2013   T3FREE 3.0 10/20/2013   T3FREE 3.5 09/17/2013   Antithyroid antibodies: Lab Results  Component Value Date   TSI <89 01/20/2022   Pt denies: - feeling nodules in neck - hoarseness - dysphagia - choking  She denies: - excessive sweating/heat intolerance - tremors - anxiety - palpitations - hyperdefecation - weight loss  Pt does not have a FH of thyroid ds. No FH  of thyroid cancer.  No steroid use. No herbal supplements. No Biotin use.  Pt. also has a history of HTN, HL, migraines, prediabetes, cystocele - s/p cervical prolapse surgery.  ROS: + see HPI  Past Medical History:  Diagnosis Date   Allergy    Anemia    Arthritis    Atrial fibrillation ([place])    Cardiomyopathy, nonischemic ([place])    a.) felt to be tachycardia mediated NICM; b.) TTE 09/01/2015: EF 35-40%; c.) [facility] 11/26/2015: ED 30-35%, norm cors, mPA 16, PCWP 7, CO 4.7, CI 2.8; d.) TTE 01/31/2016: EF 40-45%; e.) cMRI 04/27/2016: EF 48%; f.) TTE 12/06/2016: EF 55-60%; g.) TTE 03/25/2018: EF 50-55%   Carpal tunnel syndrome, bilateral    Chronic back pain    Congestive heart failure ([place])    a.) TTE 09/01/2015: EF 35-40%, mod MR; b.) R/LHC 11/26/2015: EF  30-35%; c.) TTE 01/31/2016: EF 40-45%, mild LVH, diffuse HK, triv MR/TR; d.) TTE 12/06/2016: EF 55-60%, mild MAC, G1DD; e.) TTE 03/25/2018: EF 50-55%, triv PR, G2DD.   Diverticulosis    Dyspnea    with exertion   Environmental and seasonal allergies    Female cystocele    GERD (gastroesophageal reflux disease)    Hiatal hernia    Hyperlipidemia    Hypertension    Hyperthyroidism    Migraines    NSVT (nonsustained ventricular tachycardia) ([place]) 11/16/2015   a.) felt to be related to NICM in the setting of increased stressors, influenza A, hypotension Tx'd with rapid 6L IVF bolus   Overactive detrusor    Personal history of colonic polyps    PONV (postoperative nausea and vomiting)    Skin cancer, basal cell    Squamous cell skin cancer    SUI (stress urinary incontinence, female)    SVT (supraventricular tachycardia) 08/31/2015   a.) rates as high as 150 bpm; b.) EP feels as if event was "long RP tachycardia"   Uterovaginal prolapse    Vaginal atrophy    Past Surgical History:  Procedure Laterality Date   ANTERIOR AND POSTERIOR VAGINAL REPAIR W/ SACROSPINOUS LIGAMENT SUSPENSION  10/18/2012   bilateral trigger finger surgery      CARDIAC CATHETERIZATION N/A 11/26/2015   Procedure: Right/Left Heart Cath and Coronary Angiography;  Surgeon: Jolaine Artist, MD;  Location: [facility];  Service: Cardiovascular;  Laterality: N/A;   carpal tunnel on right      CATARACT EXTRACTION Bilateral    CHOLECYSTECTOMY  2006   COLONOSCOPY WITH PROPOFOL N/A 03/19/2017   Procedure: COLONOSCOPY WITH PROPOFOL;  Surgeon: Lollie Sails, MD;  Location: [facility];  Service: Endoscopy;  Laterality: N/A;   COLPOPEXY  10/18/2012   CYSTOSCOPY WITH BIOPSY N/A 04/21/2022   Procedure: CYSTOSCOPY WITH BLADDER BIOPSY;  Surgeon: Billey Co, MD;  Location: [facility];  Service: Urology;  Laterality: N/A;   CYSTOURETHROSCOPY  10/18/2012   ESOPHAGOGASTRODUODENOSCOPY (EGD) WITH PROPOFOL N/A  10/03/2016   Procedure: ESOPHAGOGASTRODUODENOSCOPY (EGD) WITH PROPOFOL;  Surgeon: Lollie Sails, MD;  Location: [facility];  Service: Endoscopy;  Laterality: N/A;   HYSTEROSCOPY  2011, 10/2010   OOPHORECTOMY     REVERSE SHOULDER ARTHROPLASTY Right 06/17/2019   Procedure: REVERSE SHOULDER ARTHROPLASTY;  Surgeon: Corky Mull, MD;  Location: [facility];  Service: Orthopedics;  Laterality: Right;   right shoulder replacement      ROBOTIC ASSISTED LAPAROSCOPIC SACROCOLPOPEXY N/A 06/21/2022   Procedure: XI ROBOTIC ASSISTED LAPAROSCOPIC SACROCOLPOPEXY;  Surgeon: [name], MD;  Location: [facility];  Service:  Urology;  Laterality: N/A;  240 MINUTES   TONSILLECTOMY     TRIGGER FINGER RELEASE     TUBAL LIGATION     VAGINAL HYSTERECTOMY     midurethral sling   Social History   Socioeconomic History   Marital status: Widowed    Spouse name: Not on file   Number of children: 2   Years of education: Not on file   Highest education level: Not on file  Occupational History   Not on file  Tobacco Use   Smoking status: Never    Passive exposure: Never   Smokeless tobacco: Never  Vaping Use   Vaping Use: Never used  Substance and Sexual Activity   Alcohol use: Not Currently    Comment: Socially   Drug use: Never   Sexual activity: Yes    Birth control/protection: Post-menopausal  Other Topics Concern   Not on file  Social History Narrative   Lives alone   Social Determinants of Health   Financial Resource Strain: Low Risk  (11/14/2021)   Overall Financial Resource Strain (CARDIA)    Difficulty of Paying Living Expenses: Not hard at all  Food Insecurity: No Food Insecurity (06/21/2022)   Hunger Vital Sign    Worried About Running Out of Food in the Last Year: Never true    Ran Out of Food in the Last Year: Never true  Transportation Needs: No Transportation Needs (06/21/2022)   PRAPARE - Hydrologist (Medical): No    Lack of Transportation  (Non-Medical): No  Physical Activity: Not on file  Stress: No Stress Concern Present (11/14/2021)   [place]    Feeling of Stress : Not at all  Social Connections: Moderately Integrated (11/14/2021)   Social Connection and Isolation Panel [NHANES]    Frequency of Communication with Friends and Family: More than three times a week    Frequency of Social Gatherings with Friends and Family: More than three times a week    Attends Religious Services: More than 4 times per year    Active Member of [employer] or Organizations: Yes    Attends Archivist Meetings: Not on file    Marital Status: Widowed  Intimate Partner Violence: Not At Risk (06/21/2022)   Humiliation, Afraid, Rape, and Kick questionnaire    Fear of Current or Ex-Partner: No    Emotionally Abused: No    Physically Abused: No    Sexually Abused: No   Current Outpatient Medications on File Prior to Visit  Medication Sig Dispense Refill   acetaminophen (TYLENOL) 500 MG tablet Take 500-1,000 mg by mouth every 6 (six) hours as needed for moderate pain or headache.     acetaminophen (TYLENOL) 650 MG CR tablet Take 650 mg by mouth 2 (two) times daily with a meal.     calcium carbonate (TUMS - DOSED IN MG ELEMENTAL CALCIUM) 500 MG chewable tablet Chew 1 tablet by mouth daily as needed for indigestion or heartburn.     carvedilol (COREG) 12.5 MG tablet TAKE 1 TABLET (12.5MG TOTAL) BY MOUTH TWICE A DAY WITH MEALS 180 tablet 0   cephALEXin (KEFLEX) 500 MG capsule Take 1 capsule (500 mg total) by mouth 3 (three) times daily. 9 capsule 0   docusate sodium (COLACE) 100 MG capsule Take 1 capsule (100 mg total) by mouth 2 (two) times daily.     estradiol (ESTRACE VAGINAL) 0.1 MG/GM vaginal cream Place 1 Applicatorful vaginally 3 (  three) times a week. Use 1 small dolyp of cream on tip of index finger and swap the inside of the vagina 42.5 g 1   fexofenadine (ALLEGRA) 180 MG  tablet Take 180 mg by mouth at bedtime.      furosemide (LASIX) 20 MG tablet TAKE 1 TABLET (20 MG TOTAL) BY MOUTH EVERY OTHER DAY. (Patient taking differently: Take 20 mg by mouth daily as needed (only when home).) 45 tablet 3   methimazole (TAPAZOLE) 5 MG tablet Take 0.5 tablets (2.5 mg total) by mouth daily. 45 tablet 3   rosuvastatin (CRESTOR) 10 MG tablet TAKE 1 TABLET BY MOUTH EVERY DAY 90 tablet 3   SUMAtriptan (IMITREX) 100 MG tablet MAY REPEAT IN 2 HOURS IF HEADACHE PERSISTS OR RECURS. (Patient taking differently: Take 50 mg by mouth every 2 (two) hours as needed for migraine or headache. May repeat in 2 hours if headache persists or recurs.) 9 tablet 0   traMADol (ULTRAM) 50 MG tablet Take 1-2 tablets (50-100 mg total) by mouth every 6 (six) hours as needed for moderate pain or severe pain. 20 tablet 0   triamcinolone (NASACORT) 55 MCG/ACT AERO nasal inhaler Place 1 spray into the nose daily as needed (allergies).     zonisamide (ZONEGRAN) 100 MG capsule TAKE 1 CAPSULE BY MOUTH EVERY DAY 90 capsule 0   No current facility-administered medications on file prior to visit.   No Known Allergies Family History  Problem Relation Age of Onset   Hypertension Mother    Stroke Mother    Diabetes Mother    Heart disease Father    Hypercholesterolemia Father    Hypertension Father    Stroke Father    Heart attack Father    Breast cancer Neg Hx    Colon cancer Neg Hx    Thyroid disease Neg Hx    PE: BP 120/74 (BP Location: Right Arm, Patient Position: Sitting, Cuff Size: Normal)   Pulse 74   Ht _0  (1.549 m)   Wt 162 lb 6.4 oz (73.7 kg)   LMP 10/18/2012   SpO2 99%   BMI 30.69 kg/m  Wt Readings from Last 3 Encounters:  08/01/22 162 lb 6.4 oz (73.7 kg)  06/21/22 159 lb 2 oz (72.2 kg)  06/16/22 159 lb 2 oz (72.2 kg)   Constitutional: normal weight, in NAD Eyes: EOMI, no exophthalmos, no lid lag, no stare ENT: no thyromegaly, no thyroid bruits, no cervical  lymphadenopathy Cardiovascular: RRR, No MRG Respiratory: CTA B Musculoskeletal: no deformities Skin: no rashes Neurological: no tremor with outstretched hands  ASSESSMENT: 1. Thyrotoxicosis  PLAN:  1. Patient with history of thyrotoxicosis, without thyrotoxic symptoms: No weight loss, heat intolerance, hyperdefecation, palpitations, anxiety. Her thyrotoxicosis was assumed to be related to Graves' disease and she was initially started on methimazole and at our last visit she was on 5 mg daily. Reviewing her TFTs since 2019, these were always normal, including at last visit.  Also, TSI antibodies were undetectable.  Therefore, I advised her to decrease the dose to 2.5 mg daily. -Repeat TFTs on this dose were normal in 02/2022 >> at that time, due to the fact that TSH decreased by 50%, I advised her to continue the same dose of methimazole -At today's visit, she feels well, without thyrotoxic signs or symptoms. She gained 1-2 lbs since last OV due to being sedentary after her surgery. -possible causes of thyrotoxicosis to include Graves' disease, thyroiditis, and toxic multinodular goiter/toxic adenoma.  As of now,  we do not have a clear diagnosis, but it is possible that she had mild Graves' disease, which resolved. -We will recheck her TFTs today and see if we can stop methimazole -She continues on beta-blocker (carvedilol) per cardiology.  At today's visit, she is not tachycardic or tremulous. -She denies signs of active Graves' ophthalmopathy: Double vision, blurry vision, eye pain, chemosis -We discussed that if her TFTs today are normal, I will see her back in a year and recheck her TFTs.  At that time, if they remain normal, she can follow-up with PCP with annual TFTs  - RTC in 1 year  Needs refills.  Philemon Kingdom, MD PhD [facility]

## 2022-08-02 DIAGNOSIS — L988 Other specified disorders of the skin and subcutaneous tissue: Secondary | ICD-10-CM | POA: Diagnosis not present

## 2022-08-02 DIAGNOSIS — C44622 Squamous cell carcinoma of skin of right upper limb, including shoulder: Secondary | ICD-10-CM | POA: Diagnosis not present

## 2022-08-02 MED ORDER — METHIMAZOLE 5 MG PO TABS: 2.5000 mg | ORAL_TABLET | Freq: Every day | ORAL | 3 refills | Status: DC

## 2022-08-20 ENCOUNTER — Other Ambulatory Visit: Payer: Self-pay | Admitting: Cardiovascular Disease

## 2022-08-24 ENCOUNTER — Other Ambulatory Visit: Payer: Self-pay | Admitting: Internal Medicine

## 2022-08-25 MED ORDER — ZONISAMIDE 100 MG PO CAPS: 100.0000 mg | ORAL_CAPSULE | Freq: Every day | ORAL | 1 refills | Status: DC

## 2022-08-27 ENCOUNTER — Other Ambulatory Visit: Payer: Self-pay | Admitting: Internal Medicine

## 2022-09-13 ENCOUNTER — Other Ambulatory Visit: Payer: Self-pay | Admitting: Internal Medicine

## 2022-10-13 ENCOUNTER — Ambulatory Visit (INDEPENDENT_AMBULATORY_CARE_PROVIDER_SITE_OTHER): Payer: Medicare Other | Admitting: Internal Medicine

## 2022-10-13 ENCOUNTER — Encounter: Payer: Self-pay | Admitting: Internal Medicine

## 2022-10-13 VITALS — BP 122/74 | HR 75 | Temp 97.9°F | Resp 16 | Ht 61.0 in | Wt 162.4 lb

## 2022-10-13 DIAGNOSIS — I420 Dilated cardiomyopathy: Secondary | ICD-10-CM | POA: Diagnosis not present

## 2022-10-13 DIAGNOSIS — N200 Calculus of kidney: Secondary | ICD-10-CM | POA: Diagnosis not present

## 2022-10-13 DIAGNOSIS — N814 Uterovaginal prolapse, unspecified: Secondary | ICD-10-CM | POA: Diagnosis not present

## 2022-10-13 DIAGNOSIS — R339 Retention of urine, unspecified: Secondary | ICD-10-CM

## 2022-10-13 DIAGNOSIS — R739 Hyperglycemia, unspecified: Secondary | ICD-10-CM

## 2022-10-13 DIAGNOSIS — I4729 Other ventricular tachycardia: Secondary | ICD-10-CM

## 2022-10-13 DIAGNOSIS — K219 Gastro-esophageal reflux disease without esophagitis: Secondary | ICD-10-CM

## 2022-10-13 DIAGNOSIS — H9193 Unspecified hearing loss, bilateral: Secondary | ICD-10-CM

## 2022-10-13 DIAGNOSIS — E7800 Pure hypercholesterolemia, unspecified: Secondary | ICD-10-CM | POA: Diagnosis not present

## 2022-10-13 DIAGNOSIS — Z1231 Encounter for screening mammogram for malignant neoplasm of breast: Secondary | ICD-10-CM | POA: Diagnosis not present

## 2022-10-13 DIAGNOSIS — Z86010 Personal history of colonic polyps: Secondary | ICD-10-CM | POA: Diagnosis not present

## 2022-10-13 DIAGNOSIS — I5022 Chronic systolic (congestive) heart failure: Secondary | ICD-10-CM | POA: Diagnosis not present

## 2022-10-13 DIAGNOSIS — E0590 Thyrotoxicosis, unspecified without thyrotoxic crisis or storm: Secondary | ICD-10-CM | POA: Diagnosis not present

## 2022-10-13 LAB — BASIC METABOLIC PANEL
BUN: 16 mg/dL (ref 6–23)
CO2: 29 mEq/L (ref 19–32)
Calcium: 9.9 mg/dL (ref 8.4–10.5)
Chloride: 105 mEq/L (ref 96–112)
Creatinine, Ser: 0.84 mg/dL (ref 0.40–1.20)
GFR: 70.7 mL/min (ref >60.00)
Glucose, Bld: 96 mg/dL (ref 70–99)
Potassium: 4.6 mEq/L (ref 3.5–5.1)
Sodium: 141 mEq/L (ref 135–145)

## 2022-10-13 LAB — HEPATIC FUNCTION PANEL
ALT: 14 U/L (ref 0–35)
AST: 19 U/L (ref 0–37)
Albumin: 4 g/dL (ref 3.5–5.2)
Alkaline Phosphatase: 120 U/L — ABNORMAL HIGH (ref 39–117)
Bilirubin, Direct: 0.1 mg/dL (ref 0.0–0.3)
Total Bilirubin: 0.5 mg/dL (ref 0.2–1.2)
Total Protein: 6.4 g/dL (ref 6.0–8.3)

## 2022-10-13 LAB — LIPID PANEL
Cholesterol: 170 mg/dL (ref 0–200)
HDL: 52.2 mg/dL (ref >39.00)
LDL Cholesterol: 88 mg/dL (ref 0–99)
NonHDL: 117.44
Total CHOL/HDL Ratio: 3
Triglycerides: 148 mg/dL (ref 0.0–149.0)
VLDL: 29.6 mg/dL (ref 0.0–40.0)

## 2022-10-13 LAB — HEMOGLOBIN A1C: Hgb A1c MFr Bld: 6 % (ref 4.6–6.5)

## 2022-10-13 NOTE — Progress Notes (Unsigned)
Subjective:    [name], female    DOB: [birth year], 72 y.o.   MRN: [id number]  Patient here for  Chief Complaint  Patient presents with   Medical Management of Chronic Issues    HPI Here to follow up regarding her blood pressure and cholesterol. Seeing urology. Had cystoscopy - bladder biopsy negative. Is s/p robotic sacrocolpopexy 06/21/22.  Saw cardiology 05/2022 - no changes made.  Continue carvedilol.  Has lasix as needed. She is doing well.  Breathing stable. No increased cough or congestion.  No abdominal pain or bowel change reported.  Is concerned regarding her weight.  Discussed diet and exercise.  Information given. Also concerned regarding hearing loss.  No cerumen impaction.  Discussed referral to ENT.    Past Medical History:  Diagnosis Date   Allergy    Anemia    Arthritis    Atrial fibrillation ([place])    Cardiomyopathy, nonischemic ([place])    a.) felt to be tachycardia mediated NICM; b.) TTE 09/01/2015: EF 35-40%; c.) [place] 11/26/2015: ED 30-35%, norm cors, mPA 16, PCWP 7, CO 4.7, CI 2.8; d.) TTE 01/31/2016: EF 40-45%; e.) cMRI 04/27/2016: EF 48%; f.) TTE 12/06/2016: EF 55-60%; g.) TTE 03/25/2018: EF 50-55%   Carpal tunnel syndrome, bilateral    Chronic back pain    Congestive heart failure ([place])    a.) TTE 09/01/2015: EF 35-40%, mod MR; b.) R/LHC 11/26/2015: EF 30-35%; c.) TTE 01/31/2016: EF 40-45%, mild LVH, diffuse HK, triv MR/TR; d.) TTE 12/06/2016: EF 55-60%, mild MAC, G1DD; e.) TTE 03/25/2018: EF 50-55%, triv PR, G2DD.   Diverticulosis    Dyspnea    with exertion   Environmental and seasonal allergies    Female cystocele    GERD (gastroesophageal reflux disease)    Hiatal hernia    Hyperlipidemia    Hypertension    Hyperthyroidism    Migraines    NSVT (nonsustained ventricular tachycardia) ([place]) 11/16/2015   a.) felt to be related to NICM in the setting of increased stressors, influenza A, hypotension Tx'd with rapid 6L IVF bolus    Overactive detrusor    Personal history of colonic polyps    PONV (postoperative nausea and vomiting)    Skin cancer, basal cell    Squamous cell skin cancer    SUI (stress urinary incontinence, female)    SVT (supraventricular tachycardia) 08/31/2015   a.) rates as high as 150 bpm; b.) EP feels as if event was "long RP tachycardia"   Uterovaginal prolapse    Vaginal atrophy    Past Surgical History:  Procedure Laterality Date   ANTERIOR AND POSTERIOR VAGINAL REPAIR W/ SACROSPINOUS LIGAMENT SUSPENSION  10/18/2012   bilateral trigger finger surgery      CARDIAC CATHETERIZATION N/A 11/26/2015   Procedure: Right/Left Heart Cath and Coronary Angiography;  Surgeon: Jolaine Artist, MD;  Location: [facility];  Service: Cardiovascular;  Laterality: N/A;   carpal tunnel on right      CATARACT EXTRACTION Bilateral    CHOLECYSTECTOMY  2006   COLONOSCOPY WITH PROPOFOL N/A 03/19/2017   Procedure: COLONOSCOPY WITH PROPOFOL;  Surgeon: Lollie Sails, MD;  Location: [facility] ENDOSCOPY;  Service: Endoscopy;  Laterality: N/A;   COLPOPEXY  10/18/2012   CYSTOSCOPY WITH BIOPSY N/A 04/21/2022   Procedure: CYSTOSCOPY WITH BLADDER BIOPSY;  Surgeon: Billey Co, MD;  Location: [facility];  Service: Urology;  Laterality: N/A;   CYSTOURETHROSCOPY  10/18/2012   ESOPHAGOGASTRODUODENOSCOPY (EGD) WITH PROPOFOL N/A 10/03/2016  Procedure: ESOPHAGOGASTRODUODENOSCOPY (EGD) WITH PROPOFOL;  Surgeon: Lollie Sails, MD;  Location: [facility] END[facility]COPY;  Service: Endoscopy;  Laterality: N/A;   HYSTEROSCOPY  2011, 10/2010   OOPHORECTOMY     REVERSE SHOULDER ARTHROPLASTY Right 06/17/2019   Procedure: REVERSE SHOULDER ARTHROPLASTY;  Surgeon: Corky Mull, MD;  Location: [facility];  Service: Orthopedics;  Laterality: Right;   right shoulder replacement      ROBOTIC ASSISTED LAPAROSCOPIC SACROCOLPOPEXY N/A 06/21/2022   Procedure: XI ROBOTIC ASSISTED LAPAROSCOPIC SACROCOLPOPEXY;  Surgeon: [name], MD;   Location: [facility];  Service: Urology;  Laterality: N/A;  240 MINUTES   TONSILLECTOMY     TRIGGER FINGER RELEASE     TUBAL LIGATION     VAGINAL HYSTERECTOMY     midurethral sling   Family History  Problem Relation Age of Onset   Hypertension Mother    Stroke Mother    Diabetes Mother    Heart disease Father    Hypercholesterolemia Father    Hypertension Father    Stroke Father    Heart attack Father    Breast cancer Neg Hx    Colon cancer Neg Hx    Thyroid disease Neg Hx    Social History   Socioeconomic History   Marital status: Widowed    Spouse name: Not on file   Number of children: 2   Years of education: Not on file   Highest education level: Not on file  Occupational History   Not on file  Tobacco Use   Smoking status: Never    Passive exposure: Never   Smokeless tobacco: Never  Vaping Use   Vaping Use: Never used  Substance and Sexual Activity   Alcohol use: Not Currently    Comment: Socially   Drug use: Never   Sexual activity: Yes    Birth control/protection: Post-menopausal  Other Topics Concern   Not on file  Social History Narrative   Lives alone   Social Determinants of Health   Financial Resource Strain: Low Risk  (11/14/2021)   Overall Financial Resource Strain (CARDIA)    Difficulty of Paying Living Expenses: Not hard at all  Food Insecurity: No Food Insecurity (06/21/2022)   Hunger Vital Sign    Worried About Running Out of Food in the Last Year: Never true    Ran Out of Food in the Last Year: Never true  Transportation Needs: No Transportation Needs (06/21/2022)   PRAPARE - Hydrologist (Medical): No    Lack of Transportation (Non-Medical): No  Physical Activity: Not on file  Stress: No Stress Concern Present (11/14/2021)   [place]    Feeling of Stress : Not at all  Social Connections: Moderately Integrated (11/14/2021)   Social Connection  and Isolation Panel [NHANES]    Frequency of Communication with Friends and Family: More than three times a week    Frequency of Social Gatherings with Friends and Family: More than three times a week    Attends Religious Services: More than 4 times per year    Active Member of [employer] or Organizations: Yes    Attends Archivist Meetings: Not on file    Marital Status: Widowed     Review of Systems  Constitutional:  Negative for appetite change and unexpected weight change.  HENT:  Positive for hearing loss. Negative for congestion and sinus pressure.   Respiratory:  Negative for cough, chest tightness and shortness of  breath.   Cardiovascular:  Negative for chest pain, palpitations and leg swelling.  Gastrointestinal:  Negative for abdominal pain, diarrhea, nausea and vomiting.  Genitourinary:  Negative for difficulty urinating and dysuria.  Musculoskeletal:  Negative for joint swelling and myalgias.  Skin:  Negative for color change and rash.  Neurological:  Negative for dizziness and headaches.  Psychiatric/Behavioral:  Negative for agitation and dysphoric mood.        Objective:     BP 122/74   Pulse 75   Temp 97.9 F (36.6 C)   Resp 16   Ht 5' 1"$  (1.549 m)   Wt 162 lb 6.4 oz (73.7 kg)   LMP 10/18/2012   SpO2 99%   BMI 30.69 kg/m  Wt Readings from Last 3 Encounters:  10/13/22 162 lb 6.4 oz (73.7 kg)  08/01/22 162 lb 6.4 oz (73.7 kg)  06/21/22 159 lb 2 oz (72.2 kg)    Physical Exam Vitals reviewed.  Constitutional:      General: She is not in acute distress.    Appearance: Normal appearance.  HENT:     Head: Normocephalic and atraumatic.     Right Ear: External ear normal.     Left Ear: External ear normal.  Eyes:     General: No scleral icterus.       Right eye: No discharge.        Left eye: No discharge.     Conjunctiva/sclera: Conjunctivae normal.  Neck:     Thyroid: No thyromegaly.  Cardiovascular:     Rate and Rhythm: Normal rate and  regular rhythm.  Pulmonary:     Effort: No respiratory distress.     Breath sounds: Normal breath sounds. No wheezing.  Abdominal:     General: Bowel sounds are normal.     Palpations: Abdomen is soft.     Tenderness: There is no abdominal tenderness.  Musculoskeletal:        General: No swelling or tenderness.     Cervical back: Neck supple. No tenderness.  Lymphadenopathy:     Cervical: No cervical adenopathy.  Skin:    Findings: No erythema or rash.  Neurological:     Mental Status: She is alert.  Psychiatric:        Mood and Affect: Mood normal.        Behavior: Behavior normal.      Outpatient Encounter Medications as of 10/13/2022  Medication Sig   acetaminophen (TYLENOL) 500 MG tablet Take 500-1,000 mg by mouth every 6 (six) hours as needed for moderate pain or headache.   acetaminophen (TYLENOL) 650 MG CR tablet Take 650 mg by mouth 2 (two) times daily with a meal.   calcium carbonate (TUMS - DOSED IN MG ELEMENTAL CALCIUM) 500 MG chewable tablet Chew 1 tablet by mouth daily as needed for indigestion or heartburn.   carvedilol (COREG) 12.5 MG tablet TAKE 1 TABLET (12.5MG TOTAL) BY MOUTH TWICE A DAY WITH MEALS   docusate sodium (COLACE) 100 MG capsule Take 1 capsule (100 mg total) by mouth 2 (two) times daily.   estradiol (ESTRACE VAGINAL) 0.1 MG/GM vaginal cream Place 1 Applicatorful vaginally 3 (three) times a week. Use 1 small dolyp of cream on tip of index finger and swap the inside of the vagina   fexofenadine (ALLEGRA) 180 MG tablet Take 180 mg by mouth at bedtime.    furosemide (LASIX) 20 MG tablet TAKE 1 TABLET (20 MG TOTAL) BY MOUTH EVERY OTHER DAY. (Patient taking differently: Take 20  mg by mouth daily as needed (only when home).)   methimazole (TAPAZOLE) 5 MG tablet Take 0.5 tablets (2.5 mg total) by mouth daily.   rosuvastatin (CRESTOR) 10 MG tablet TAKE 1 TABLET BY MOUTH EVERY DAY   SUMAtriptan (IMITREX) 100 MG tablet MAY REPEAT IN 2 HOURS IF HEADACHE PERSISTS OR  RECURS.   triamcinolone (NASACORT) 55 MCG/ACT AERO nasal inhaler Place 1 spray into the nose daily as needed (allergies).   zonisamide (ZONEGRAN) 100 MG capsule Take 1 capsule (100 mg total) by mouth daily.   No facility-administered encounter medications on file as of 10/13/2022.     Lab Results  Component Value Date   WBC 7.2 06/12/2022   HGB 12.5 06/22/2022   HCT 39.6 06/22/2022   PLT 208.0 06/12/2022   GLUCOSE 96 10/13/2022   CHOL 170 10/13/2022   TRIG 148.0 10/13/2022   HDL 52.20 10/13/2022   LDLCALC 88 10/13/2022   ALT 14 10/13/2022   AST 19 10/13/2022   NA 141 10/13/2022   K 4.6 10/13/2022   CL 105 10/13/2022   CREATININE 0.84 10/13/2022   BUN 16 10/13/2022   CO2 29 10/13/2022   TSH 1.23 08/01/2022   INR 0.98 11/26/2015   HGBA1C 6.0 10/13/2022       Assessment & Plan:  Hypercholesterolemia Assessment & Plan: On crestor.  Low cholesterol diet and exercise.  Follow lipid panel and liver function tests.    Orders: -     Lipid panel -     Hepatic function panel -     Basic metabolic panel  Hyperglycemia Assessment & Plan: Low carb diet and exercise.  Follow met b and a1c.   Orders: -     Hemoglobin A1c  Visit for screening mammogram -     3D Screening Mammogram, Left and Right; Future  Chronic systolic heart failure ([place]) Assessment & Plan: Continue coreg and lasix prn. No evidence of volume overload.  Follow.    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[facility]) Assessment & Plan: No evidence of volume overload.  On coreg and lasix prn.  Follow. Check metabolic panel.    Cystocele with prolapse Assessment & Plan:  Seeing urology. Had cystoscopy - bladder biopsy negative. Is s/p robotic sacrocolpopexy 06/21/22.    Gastroesophageal reflux disease, unspecified whether esophagitis present Assessment & Plan: Upper symptoms controlled on protonix.     History of colonic polyps Assessment & Plan: Colonoscopy 02/2017.  Recommended f/u in 5 years.  Had f/u  colonoscopy 02/21/21.    Hyperthyroidism Assessment & Plan: Thyrotoxicosis - Dr Letta Median (08/02/22) - Thyroid tests remain controlled. However, free T3 is at the upper limit of the target range, so for now I would suggest to continue the low-dose of methimazole. We will plan to repeat her TFTs in 3 months.    Incomplete emptying of bladder Assessment & Plan: Seeing urology. Had cystoscopy - bladder biopsy negative. Is s/p robotic sacrocolpopexy 06/21/22.    Nephrolithiasis Assessment & Plan: Saw Dr Diamantina Providence 04/12/22 - Schedule cystoscopy, stone evacuation, bladder biopsy and fulguration Bladder biopsy 04/21/22 -  URINARY BLADDER; TRANSURETHRAL BIOPSY:  - FOLLICULAR CYSTITIS.  - NEGATIVE FOR MALIGNANCY.    NSVT (nonsustained ventricular tachycardia) ([place]) Assessment & Plan: On coreg.  No increased heart rate or palpitations.  Follow.    Bilateral hearing loss, unspecified hearing loss type Assessment & Plan: Has noticed decreased hearing.  No cerumen impaction noted on exam.  Referral to ENT for further evaluation.  Orders: -     Ambulatory referral  to ENT     Einar Pheasant, MD

## 2022-10-14 ENCOUNTER — Encounter: Payer: Self-pay | Admitting: Internal Medicine

## 2022-10-14 DIAGNOSIS — H9190 Unspecified hearing loss, unspecified ear: Secondary | ICD-10-CM | POA: Insufficient documentation

## 2022-10-14 HISTORY — DX: Unspecified hearing loss, unspecified ear: H91.90

## 2022-10-14 NOTE — Assessment & Plan Note (Signed)
Has noticed decreased hearing.  No cerumen impaction noted on exam.  Referral to ENT for further evaluation.

## 2022-10-14 NOTE — Assessment & Plan Note (Signed)
No evidence of volume overload.  On coreg and lasix prn.  Follow. Check metabolic panel.

## 2022-10-14 NOTE — Assessment & Plan Note (Signed)
Seeing urology. Had cystoscopy - bladder biopsy negative. Is s/p robotic sacrocolpopexy 06/21/22.

## 2022-10-14 NOTE — Assessment & Plan Note (Signed)
Upper symptoms controlled on protonix.  

## 2022-10-14 NOTE — Assessment & Plan Note (Signed)
Low carb diet and exercise.  Follow met b and a1c.   

## 2022-10-14 NOTE — Assessment & Plan Note (Signed)
On coreg.  No increased heart rate or palpitations.  Follow.

## 2022-10-14 NOTE — Assessment & Plan Note (Signed)
Continue coreg and lasix prn. No evidence of volume overload.  Follow.

## 2022-10-14 NOTE — Assessment & Plan Note (Signed)
Thyrotoxicosis - Dr Letta Median (08/02/22) - Thyroid tests remain controlled. However, free T3 is at the upper limit of the target range, so for now I would suggest to continue the low-dose of methimazole. We will plan to repeat her TFTs in 3 months.

## 2022-10-14 NOTE — Assessment & Plan Note (Signed)
On crestor.  Low cholesterol diet and exercise.  Follow lipid panel and liver function tests.   

## 2022-10-14 NOTE — Assessment & Plan Note (Signed)
Colonoscopy 02/2017.  Recommended f/u in 5 years.  Had f/u colonoscopy 02/21/21.

## 2022-10-14 NOTE — Assessment & Plan Note (Signed)
Saw Dr Diamantina Providence 04/12/22 - Schedule cystoscopy, stone evacuation, bladder biopsy and fulguration Bladder biopsy 04/21/22 -  URINARY BLADDER; TRANSURETHRAL BIOPSY:  - FOLLICULAR CYSTITIS.  - NEGATIVE FOR MALIGNANCY.

## 2022-10-16 ENCOUNTER — Other Ambulatory Visit: Payer: Self-pay

## 2022-10-16 DIAGNOSIS — R748 Abnormal levels of other serum enzymes: Secondary | ICD-10-CM

## 2022-10-23 DIAGNOSIS — L570 Actinic keratosis: Secondary | ICD-10-CM | POA: Diagnosis not present

## 2022-10-23 DIAGNOSIS — Z859 Personal history of malignant neoplasm, unspecified: Secondary | ICD-10-CM | POA: Diagnosis not present

## 2022-10-23 DIAGNOSIS — Z85828 Personal history of other malignant neoplasm of skin: Secondary | ICD-10-CM | POA: Diagnosis not present

## 2022-10-23 DIAGNOSIS — L821 Other seborrheic keratosis: Secondary | ICD-10-CM | POA: Diagnosis not present

## 2022-10-23 DIAGNOSIS — L578 Other skin changes due to chronic exposure to nonionizing radiation: Secondary | ICD-10-CM | POA: Diagnosis not present

## 2022-10-23 DIAGNOSIS — Z872 Personal history of diseases of the skin and subcutaneous tissue: Secondary | ICD-10-CM | POA: Diagnosis not present

## 2022-10-23 DIAGNOSIS — Z86018 Personal history of other benign neoplasm: Secondary | ICD-10-CM | POA: Diagnosis not present

## 2022-10-23 DIAGNOSIS — L718 Other rosacea: Secondary | ICD-10-CM | POA: Diagnosis not present

## 2022-10-30 DIAGNOSIS — N8111 Cystocele, midline: Secondary | ICD-10-CM | POA: Diagnosis not present

## 2022-10-30 DIAGNOSIS — N3020 Other chronic cystitis without hematuria: Secondary | ICD-10-CM | POA: Diagnosis not present

## 2022-11-07 ENCOUNTER — Telehealth: Payer: Self-pay | Admitting: Internal Medicine

## 2022-11-07 NOTE — Telephone Encounter (Signed)
Contacted GERMANI SINGERMAN to schedule their annual wellness visit. Call back at later date: 11/08/2022  Thank you,  [place] Direct dial  [phone]

## 2022-11-08 ENCOUNTER — Telehealth: Payer: Self-pay | Admitting: Internal Medicine

## 2022-11-08 ENCOUNTER — Other Ambulatory Visit (INDEPENDENT_AMBULATORY_CARE_PROVIDER_SITE_OTHER): Payer: Medicare Other

## 2022-11-08 DIAGNOSIS — E0590 Thyrotoxicosis, unspecified without thyrotoxic crisis or storm: Secondary | ICD-10-CM | POA: Diagnosis not present

## 2022-11-08 LAB — T3, FREE: T3, Free: 3 pg/mL (ref 2.3–4.2)

## 2022-11-08 LAB — T4, FREE: Free T4: 0.77 ng/dL (ref 0.60–1.60)

## 2022-11-08 LAB — TSH: TSH: 0.84 u[IU]/mL (ref 0.35–5.50)

## 2022-11-08 NOTE — Telephone Encounter (Signed)
Contacted Kristi Greer to schedule their annual wellness visit. Appointment made for 11/17/2022.  Thank you,  [place] Direct dial  [phone]

## 2022-11-14 ENCOUNTER — Other Ambulatory Visit (INDEPENDENT_AMBULATORY_CARE_PROVIDER_SITE_OTHER): Payer: Medicare Other

## 2022-11-14 DIAGNOSIS — R748 Abnormal levels of other serum enzymes: Secondary | ICD-10-CM

## 2022-11-14 LAB — HEPATIC FUNCTION PANEL
ALT: 16 U/L (ref 0–35)
AST: 21 U/L (ref 0–37)
Albumin: 3.9 g/dL (ref 3.5–5.2)
Alkaline Phosphatase: 115 U/L (ref 39–117)
Bilirubin, Direct: 0.1 mg/dL (ref 0.0–0.3)
Total Bilirubin: 0.4 mg/dL (ref 0.2–1.2)
Total Protein: 6.2 g/dL (ref 6.0–8.3)

## 2022-11-20 ENCOUNTER — Ambulatory Visit (INDEPENDENT_AMBULATORY_CARE_PROVIDER_SITE_OTHER): Payer: Medicare Other

## 2022-11-20 VITALS — Ht 61.0 in | Wt 157.0 lb

## 2022-11-20 DIAGNOSIS — Z0000 Encounter for general adult medical examination without abnormal findings: Secondary | ICD-10-CM | POA: Diagnosis not present

## 2022-11-20 NOTE — Progress Notes (Signed)
Subjective:   Kristi Greer is a 72 y.o. female who presents for Medicare Annual (Subsequent) preventive examination.  Review of Systems    No ROS.  Medicare Wellness Virtual Visit.  Visual/audio telehealth visit, UTA vital signs.   See social history for additional risk factors.   Cardiac Risk Factors include: advanced age (>81men, >78 women)     Objective:    Today's Vitals   11/20/22 1443  Weight: 157 lb (71.2 kg)  Height: 5\' 1"  (1.549 m)   Body mass index is 29.66 kg/m.     11/20/2022    2:48 PM 06/21/2022    5:00 PM 06/13/2022   11:14 AM 04/21/2022   11:34 AM 04/18/2022    1:04 PM 03/14/2022    3:09 PM 11/14/2021   10:57 AM  Advanced Directives  Does Patient Have a Medical Advance Directive? Yes Yes Yes Yes No No Yes  Type of Paramedic of [place];Living will Living will [place];Living will [place];Living will   [place];Living will  Does patient want to make changes to medical advance directive? No - Patient declined No - Patient declined  No - Patient declined   No - Patient declined  Copy of [place] in Chart? No - copy requested   No - copy requested   No - copy requested  Would patient like information on creating a medical advance directive?    No - Patient declined  No - Patient declined     Current Medications (verified) Outpatient Encounter Medications as of 11/20/2022  Medication Sig   acetaminophen (TYLENOL) 500 MG tablet Take 500-1,000 mg by mouth every 6 (six) hours as needed for moderate pain or headache.   acetaminophen (TYLENOL) 650 MG CR tablet Take 650 mg by mouth 2 (two) times daily with a meal.   calcium carbonate (TUMS - DOSED IN MG ELEMENTAL CALCIUM) 500 MG chewable tablet Chew 1 tablet by mouth daily as needed for indigestion or heartburn.   carvedilol (COREG) 12.5 MG tablet TAKE 1 TABLET (12.5MG  TOTAL) BY MOUTH TWICE A DAY WITH MEALS    docusate sodium (COLACE) 100 MG capsule Take 1 capsule (100 mg total) by mouth 2 (two) times daily.   estradiol (ESTRACE VAGINAL) 0.1 MG/GM vaginal cream Place 1 Applicatorful vaginally 3 (three) times a week. Use 1 small dolyp of cream on tip of index finger and swap the inside of the vagina   fexofenadine (ALLEGRA) 180 MG tablet Take 180 mg by mouth at bedtime.    furosemide (LASIX) 20 MG tablet TAKE 1 TABLET (20 MG TOTAL) BY MOUTH EVERY OTHER DAY. (Patient taking differently: Take 20 mg by mouth daily as needed (only when home).)   methimazole (TAPAZOLE) 5 MG tablet Take 0.5 tablets (2.5 mg total) by mouth daily.   rosuvastatin (CRESTOR) 10 MG tablet TAKE 1 TABLET BY MOUTH EVERY DAY   SUMAtriptan (IMITREX) 100 MG tablet MAY REPEAT IN 2 HOURS IF HEADACHE PERSISTS OR RECURS.   triamcinolone (NASACORT) 55 MCG/ACT AERO nasal inhaler Place 1 spray into the nose daily as needed (allergies).   zonisamide (ZONEGRAN) 100 MG capsule Take 1 capsule (100 mg total) by mouth daily.   No facility-administered encounter medications on file as of 11/20/2022.    Allergies (verified) Patient has no known allergies.   History: Past Medical History:  Diagnosis Date   Allergy    Anemia    Arthritis    Atrial fibrillation (  [place])    Cardiomyopathy, nonischemic ([place])    a.) felt to be tachycardia mediated NICM; b.) TTE 09/01/2015: EF 35-40%; c.) [facility] 11/26/2015: ED 30-35%, norm cors, mPA 16, PCWP 7, CO 4.7, CI 2.8; d.) TTE 01/31/2016: EF 40-45%; e.) cMRI 04/27/2016: EF 48%; f.) TTE 12/06/2016: EF 55-60%; g.) TTE 03/25/2018: EF 50-55%   Carpal tunnel syndrome, bilateral    Chronic back pain    Congestive heart failure ([place])    a.) TTE 09/01/2015: EF 35-40%, mod MR; b.) R/LHC 11/26/2015: EF 30-35%; c.) TTE 01/31/2016: EF 40-45%, mild LVH, diffuse HK, triv MR/TR; d.) TTE 12/06/2016: EF 55-60%, mild MAC, G1DD; e.) TTE 03/25/2018: EF 50-55%, triv PR, G2DD.   Diverticulosis    Dyspnea    with exertion    Environmental and seasonal allergies    Female cystocele    GERD (gastroesophageal reflux disease)    Hiatal hernia    Hyperlipidemia    Hypertension    Hyperthyroidism    Migraines    NSVT (nonsustained ventricular tachycardia) ([place]) 11/16/2015   a.) felt to be related to NICM in the setting of increased stressors, influenza A, hypotension Tx'd with rapid 6L IVF bolus   Overactive detrusor    Personal history of colonic polyps    PONV (postoperative nausea and vomiting)    Skin cancer, basal cell    Squamous cell skin cancer    SUI (stress urinary incontinence, female)    SVT (supraventricular tachycardia) 08/31/2015   a.) rates as high as 150 bpm; b.) EP feels as if event was "long RP tachycardia"   Uterovaginal prolapse    Vaginal atrophy    Past Surgical History:  Procedure Laterality Date   ANTERIOR AND POSTERIOR VAGINAL REPAIR W/ SACROSPINOUS LIGAMENT SUSPENSION  10/18/2012   bilateral trigger finger surgery      CARDIAC CATHETERIZATION N/A 11/26/2015   Procedure: Right/Left Heart Cath and Coronary Angiography;  Surgeon: Jolaine Artist, MD;  Location: [facility];  Service: Cardiovascular;  Laterality: N/A;   carpal tunnel on right      CATARACT EXTRACTION Bilateral    CHOLECYSTECTOMY  2006   COLONOSCOPY WITH PROPOFOL N/A 03/19/2017   Procedure: COLONOSCOPY WITH PROPOFOL;  Surgeon: Lollie Sails, MD;  Location: [facility] ENDOSCOPY;  Service: Endoscopy;  Laterality: N/A;   COLPOPEXY  10/18/2012   CYSTOSCOPY WITH BIOPSY N/A 04/21/2022   Procedure: CYSTOSCOPY WITH BLADDER BIOPSY;  Surgeon: Billey Co, MD;  Location: [facility] ORS;  Service: Urology;  Laterality: N/A;   CYSTOURETHROSCOPY  10/18/2012   ESOPHAGOGASTRODUODENOSCOPY (EGD) WITH PROPOFOL N/A 10/03/2016   Procedure: ESOPHAGOGASTRODUODENOSCOPY (EGD) WITH PROPOFOL;  Surgeon: Lollie Sails, MD;  Location: [facility] ENDOSCOPY;  Service: Endoscopy;  Laterality: N/A;   HYSTEROSCOPY  2011, 10/2010   OOPHORECTOMY      REVERSE SHOULDER ARTHROPLASTY Right 06/17/2019   Procedure: REVERSE SHOULDER ARTHROPLASTY;  Surgeon: Corky Mull, MD;  Location: [facility] ORS;  Service: Orthopedics;  Laterality: Right;   right shoulder replacement      ROBOTIC ASSISTED LAPAROSCOPIC SACROCOLPOPEXY N/A 06/21/2022   Procedure: XI ROBOTIC ASSISTED LAPAROSCOPIC SACROCOLPOPEXY;  Surgeon: [name], MD;  Location: [facility] ORS;  Service: Urology;  Laterality: N/A;  240 MINUTES   TONSILLECTOMY     TRIGGER FINGER RELEASE     TUBAL LIGATION     VAGINAL HYSTERECTOMY     midurethral sling   Family History  Problem Relation Age of Onset   Hypertension Mother    Stroke Mother    Diabetes  Mother    Heart disease Father    Hypercholesterolemia Father    Hypertension Father    Stroke Father    Heart attack Father    Breast cancer Neg Hx    Colon cancer Neg Hx    Thyroid disease Neg Hx    Social History   Socioeconomic History   Marital status: Widowed    Spouse name: Not on file   Number of children: 2   Years of education: Not on file   Highest education level: Not on file  Occupational History   Not on file  Tobacco Use   Smoking status: Never    Passive exposure: Never   Smokeless tobacco: Never  Vaping Use   Vaping Use: Never used  Substance and Sexual Activity   Alcohol use: Not Currently   Drug use: Never   Sexual activity: Yes    Birth control/protection: Post-menopausal  Other Topics Concern   Not on file  Social History Narrative   Lives alone   Social Determinants of Health   Financial Resource Strain: Low Risk  (11/20/2022)   Overall Financial Resource Strain (CARDIA)    Difficulty of Paying Living Expenses: Not hard at all  Food Insecurity: No Food Insecurity (11/20/2022)   Hunger Vital Sign    Worried About Running Out of Food in the Last Year: Never true    [place] in the Last Year: Never true  Transportation Needs: No Transportation Needs (11/20/2022)   PRAPARE - Armed forces logistics/support/administrative officer (Medical): No    Lack of Transportation (Non-Medical): No  Physical Activity: Insufficiently Active (11/20/2022)   Exercise Vital Sign    Days of Exercise per Week: 2 days    Minutes of Exercise per Session: 70 min  Stress: No Stress Concern Present (11/20/2022)   [place]    Feeling of Stress : Not at all  Social Connections: Moderately Integrated (11/20/2022)   Social Connection and Isolation Panel [NHANES]    Frequency of Communication with Friends and Family: More than three times a week    Frequency of Social Gatherings with Friends and Family: More than three times a week    Attends Religious Services: More than 4 times per year    Active Member of [employer] or Organizations: Yes    Attends Archivist Meetings: Not on file    Marital Status: Widowed    Tobacco Counseling Counseling given: Not Answered   Clinical Intake:  Pre-visit preparation completed: Yes        Diabetes: No  How often do you need to have someone help you when you read instructions, pamphlets, or other written materials from your doctor or pharmacy?: 1 - Never    Interpreter Needed?: No      Activities of Daily Living    11/20/2022    2:42 PM 06/21/2022    5:00 PM  In your present state of health, do you have any difficulty performing the following activities:  Hearing? 0 0  Vision? 0 0  Difficulty concentrating or making decisions? 0 0  Walking or climbing stairs? 0 0  Dressing or bathing? 0 0  Doing errands, shopping? 0 0  Preparing Food and eating ? N   Using the Toilet? N   In the past six months, have you accidently leaked urine? N   Do you have problems with loss of bowel control? N   Managing your Medications?  N   Managing your Finances? N   Housekeeping or managing your Housekeeping? N     Patient Care Team: Einar Pheasant, MD as PCP - General (Internal Medicine) Minna Merritts, MD as PCP - Cardiology (Cardiology) Alisa Graff, FNP as Nurse Practitioner (Family Medicine) Isaias Cowman, MD as Consulting Physician (Cardiology) Abisogun, Domenica Reamer, MD as Consulting Physician (Endocrinology)  Indicate any recent Medical Services you may have received from other than [facility] providers in the past year (date may be approximate).     Assessment:   This is a routine wellness examination for Kristi Greer.  I connected with  Kristi Greer on 11/20/22 by a audio enabled telemedicine application and verified that I am speaking with the correct person using two identifiers.  Patient Location: Home  Provider Location: Office/Clinic  I discussed the limitations of evaluation and management by telemedicine. The patient expressed understanding and agreed to proceed.   Hearing/Vision screen Hearing Screening - Comments:: Some difficulty hearing. Plans to receive hearing aids soon.  Vision Screening - Comments:: Cataract extraction, bilateral  Dietary issues and exercise activities discussed: Current Exercise Habits: Home exercise routine, Type of exercise: treadmill;calisthenics;walking, Time (Minutes): > 60, Frequency (Times/Week): 2, Weekly Exercise (Minutes/Week): 0, Intensity: Mild   Goals Addressed               This Visit's Progress     Patient Stated     DIET - REDUCE PORTION SIZE (pt-stated)   On track     Drink more water       Increase physical activity (pt-stated)        Exercise at least 3 times weekly       Depression Screen    11/20/2022    2:39 PM 06/12/2022   10:31 AM 02/07/2022    4:23 PM 01/18/2022   11:04 AM 11/14/2021   10:56 AM 10/22/2020    9:17 AM 05/11/2020    9:03 AM  PHQ 2/9 Scores  PHQ - 2 Score 0 0 0 0 0 0 0    Fall Risk    11/20/2022    2:50 PM 06/12/2022   10:31 AM 02/07/2022    4:23 PM 01/18/2022   11:03 AM 11/14/2021   10:58 AM  [place] in the past year? 0 1 0 0   Number falls in past yr:   1  0   Injury with Fall?  0  0   Risk for fall due to :  History of fall(s) No Fall Risks No Fall Risks   Follow up Falls evaluation completed;Falls prevention discussed Falls evaluation completed Falls evaluation completed Falls evaluation completed Falls evaluation completed    FALL RISK PREVENTION PERTAINING TO THE HOME: Home free of loose throw rugs in walkways, pet beds, electrical cords, etc? Yes  Adequate lighting in your home to reduce risk of falls? Yes   ASSISTIVE DEVICES UTILIZED TO PREVENT FALLS: Life alert? No  Use of a cane, walker or w/c? No   TIMED UP AND GO: Was the test performed? No .   Cognitive Function:        11/20/2022    2:50 PM 04/02/2019   11:51 AM  6CIT Screen  What Year? 0 points 0 points  What month? 0 points 0 points  What time? 0 points 0 points  Count back from 20 0 points 0 points  Months in reverse 0 points 0 points  Repeat phrase 0 points 0 points  Total  Score 0 points 0 points    Immunizations Immunization History  Administered Date(s) Administered   Fluad Quad(high Dose 65+) 04/28/2019, 09/29/2020, 06/07/2021, 06/12/2022   Influenza Split 05/27/2012, 05/26/2013, 05/15/2014   Influenza, High Dose Seasonal PF 05/01/2018   Influenza-Unspecified 05/26/2015   Pneumococcal Conjugate-13 05/13/2019   Pneumococcal Polysaccharide-23 01/28/2021   Zoster Recombinat (Shingrix) 10/11/2021, 12/14/2021   TDAP status: Due, Education has been provided regarding the importance of this vaccine. Advised may receive this vaccine at local pharmacy or Health Dept. Aware to provide a copy of the vaccination record if obtained from local pharmacy or Health Dept. Verbalized acceptance and understanding.  Screening Tests Health Maintenance  Topic Date Due   DTaP/Tdap/Td (1 - Tdap) Never done   MAMMOGRAM  11/22/2022   Medicare Annual Wellness (AWV)  11/20/2023   COLONOSCOPY (Pts 45-5yrs Insurance coverage will need to be confirmed)  03/20/2027   Pneumonia  Vaccine 17+ Years old  Completed   INFLUENZA VACCINE  Completed   DEXA SCAN  Completed   Hepatitis C Screening  Completed   Zoster Vaccines- Shingrix  Completed   HPV VACCINES  Aged Out   COVID-19 Vaccine  Discontinued   Health Maintenance Health Maintenance Due  Topic Date Due   DTaP/Tdap/Td (1 - Tdap) Never done   Mammogram - scheduled 11/23/22.  Lung Cancer Screening: (Low Dose CT Chest recommended if Age 79-80 years, 30 pack-year currently smoking OR have quit w/in 15years.) does not qualify.   Hepatitis C Screening: Completed 05/23/16.  Vision Screening: Recommended annual ophthalmology exams for early detection of glaucoma and other disorders of the eye.  Dental Screening: Recommended annual dental exams for proper oral hygiene.  Community Resource Referral / Chronic Care Management: CRR required this visit?  No   CCM required this visit?  No      Plan:     I have personally reviewed and noted the following in the patient's chart:   Medical and social history Use of alcohol, tobacco or illicit drugs  Current medications and supplements including opioid prescriptions. Patient is not currently taking opioid prescriptions. Functional ability and status Nutritional status Physical activity Advanced directives List of other physicians Hospitalizations, surgeries, and ER visits in previous 12 months Vitals Screenings to include cognitive, depression, and falls Referrals and appointments  In addition, I have reviewed and discussed with patient certain preventive protocols, quality metrics, and best practice recommendations. A written personalized care plan for preventive services as well as general preventive health recommendations were provided to patient.     Leta Jungling, LPN   [id number]

## 2022-11-20 NOTE — Patient Instructions (Signed)
Kristi Greer , Thank you for taking time to come for your Medicare Wellness Visit. I appreciate your ongoing commitment to your health goals. Please review the following plan we discussed and let me know if I can assist you in the future.   These are the goals we discussed:  Goals       Patient Stated     DIET - REDUCE PORTION SIZE (pt-stated)      Drink more water       Increase physical activity (pt-stated)      Exercise at least 3 times weekly        This is a list of the screening recommended for you and due dates:  Health Maintenance  Topic Date Due   DTaP/Tdap/Td vaccine (1 - Tdap) Never done   Mammogram  11/22/2022   Medicare Annual Wellness Visit  11/20/2023   Colon Cancer Screening  03/20/2027   Pneumonia Vaccine  Completed   Flu Shot  Completed   DEXA scan (bone density measurement)  Completed   Hepatitis C Screening: USPSTF Recommendation to screen - Ages 13-79 yo.  Completed   Zoster (Shingles) Vaccine  Completed   HPV Vaccine  Aged Out   COVID-19 Vaccine  Discontinued    Advanced directives: End of life planning; Advance aging; Advanced directives discussed.  Copy of current HCPOA/Living Will requested.    Conditions/risks identified: none new.  Next appointment: Follow up in one year for your annual wellness visit    Preventive Care 65 Years and Older, Female Preventive care refers to lifestyle choices and visits with your health care provider that can promote health and wellness. What does preventive care include? A yearly physical exam. This is also called an annual well check. Dental exams once or twice a year. Routine eye exams. Ask your health care provider how often you should have your eyes checked. Personal lifestyle choices, including: Daily care of your teeth and gums. Regular physical activity. Eating a healthy diet. Avoiding tobacco and drug use. Limiting alcohol use. Practicing safe sex. Taking low-dose aspirin every day. Taking  vitamin and mineral supplements as recommended by your health care provider. What happens during an annual well check? The services and screenings done by your health care provider during your annual well check will depend on your age, overall health, lifestyle risk factors, and family history of disease. Counseling  Your health care provider may ask you questions about your: Alcohol use. Tobacco use. Drug use. Emotional well-being. Home and relationship well-being. Sexual activity. Eating habits. History of falls. Memory and ability to understand (cognition). Work and work Statistician. Reproductive health. Screening  You may have the following tests or measurements: Height, weight, and BMI. Blood pressure. Lipid and cholesterol levels. These may be checked every 5 years, or more frequently if you are over 28 years old. Skin check. Lung cancer screening. You may have this screening every year starting at age 32 if you have a 30-pack-year history of smoking and currently smoke or have quit within the past 15 years. Fecal occult blood test (FOBT) of the stool. You may have this test every year starting at age 4. Flexible sigmoidoscopy or colonoscopy. You may have a sigmoidoscopy every 5 years or a colonoscopy every 10 years starting at age 53. Hepatitis C blood test. Hepatitis B blood test. Sexually transmitted disease (STD) testing. Diabetes screening. This is done by checking your blood sugar (glucose) after you have not eaten for a while (fasting). You may have this done  every 1-3 years. Bone density scan. This is done to screen for osteoporosis. You may have this done starting at age 31. Mammogram. This may be done every 1-2 years. Talk to your health care provider about how often you should have regular mammograms. Talk with your health care provider about your test results, treatment options, and if necessary, the need for more tests. Vaccines  Your health care provider may  recommend certain vaccines, such as: Influenza vaccine. This is recommended every year. Tetanus, diphtheria, and acellular pertussis (Tdap, Td) vaccine. You may need a Td booster every 10 years. Zoster vaccine. You may need this after age 37. Pneumococcal 13-valent conjugate (PCV13) vaccine. One dose is recommended after age 23. Pneumococcal polysaccharide (PPSV23) vaccine. One dose is recommended after age 47. Talk to your health care provider about which screenings and vaccines you need and how often you need them. This information is not intended to replace advice given to you by your health care provider. Make sure you discuss any questions you have with your health care provider. Document Released: 09/10/2015 Document Revised: 05/03/2016 Document Reviewed: 06/15/2015 Elsevier Interactive Patient Education  2017 [place] Prevention in the Home Falls can cause injuries. They can happen to people of all ages. There are many things you can do to make your home safe and to help prevent falls. What can I do on the outside of my home? Regularly fix the edges of walkways and driveways and fix any cracks. Remove anything that might make you trip as you walk through a door, such as a raised step or threshold. Trim any bushes or trees on the path to your home. Use bright outdoor lighting. Clear any walking paths of anything that might make someone trip, such as rocks or tools. Regularly check to see if handrails are loose or broken. Make sure that both sides of any steps have handrails. Any raised decks and porches should have guardrails on the edges. Have any leaves, snow, or ice cleared regularly. Use sand or salt on walking paths during winter. Clean up any spills in your garage right away. This includes oil or grease spills. What can I do in the bathroom? Use night lights. Install grab bars by the toilet and in the tub and shower. Do not use towel bars as grab bars. Use non-skid  mats or decals in the tub or shower. If you need to sit down in the shower, use a plastic, non-slip stool. Keep the floor dry. Clean up any water that spills on the floor as soon as it happens. Remove soap buildup in the tub or shower regularly. Attach bath mats securely with double-sided non-slip rug tape. Do not have throw rugs and other things on the floor that can make you trip. What can I do in the bedroom? Use night lights. Make sure that you have a light by your bed that is easy to reach. Do not use any sheets or blankets that are too big for your bed. They should not hang down onto the floor. Have a firm chair that has side arms. You can use this for support while you get dressed. Do not have throw rugs and other things on the floor that can make you trip. What can I do in the kitchen? Clean up any spills right away. Avoid walking on wet floors. Keep items that you use a lot in easy-to-reach places. If you need to reach something above you, use a strong step stool that has  a grab bar. Keep electrical cords out of the way. Do not use floor polish or wax that makes floors slippery. If you must use wax, use non-skid floor wax. Do not have throw rugs and other things on the floor that can make you trip. What can I do with my stairs? Do not leave any items on the stairs. Make sure that there are handrails on both sides of the stairs and use them. Fix handrails that are broken or loose. Make sure that handrails are as long as the stairways. Check any carpeting to make sure that it is firmly attached to the stairs. Fix any carpet that is loose or worn. Avoid having throw rugs at the top or bottom of the stairs. If you do have throw rugs, attach them to the floor with carpet tape. Make sure that you have a light switch at the top of the stairs and the bottom of the stairs. If you do not have them, ask someone to add them for you. What else can I do to help prevent falls? Wear shoes  that: Do not have high heels. Have rubber bottoms. Are comfortable and fit you well. Are closed at the toe. Do not wear sandals. If you use a stepladder: Make sure that it is fully opened. Do not climb a closed stepladder. Make sure that both sides of the stepladder are locked into place. Ask someone to hold it for you, if possible. Clearly mark and make sure that you can see: Any grab bars or handrails. First and last steps. Where the edge of each step is. Use tools that help you move around (mobility aids) if they are needed. These include: Canes. Walkers. Scooters. Crutches. Turn on the lights when you go into a dark area. Replace any light bulbs as soon as they burn out. Set up your furniture so you have a clear path. Avoid moving your furniture around. If any of your floors are uneven, fix them. If there are any pets around you, be aware of where they are. Review your medicines with your doctor. Some medicines can make you feel dizzy. This can increase your chance of falling. Ask your doctor what other things that you can do to help prevent falls. This information is not intended to replace advice given to you by your health care provider. Make sure you discuss any questions you have with your health care provider. Document Released: 06/10/2009 Document Revised: 01/20/2016 Document Reviewed: 09/18/2014 Elsevier Interactive Patient Education  2017 [employer].

## 2022-11-23 ENCOUNTER — Ambulatory Visit
Admission: RE | Admit: 2022-11-23 | Discharge: 2022-11-23 | Disposition: A | Discharge status: Home / Self Care | Payer: Medicare Other | Source: Ambulatory Visit | Attending: Internal Medicine | Admitting: Internal Medicine

## 2022-11-23 DIAGNOSIS — Z1231 Encounter for screening mammogram for malignant neoplasm of breast: Secondary | ICD-10-CM | POA: Diagnosis not present

## 2023-01-13 ENCOUNTER — Other Ambulatory Visit: Payer: Self-pay | Admitting: Internal Medicine

## 2023-01-15 ENCOUNTER — Other Ambulatory Visit: Payer: Self-pay | Admitting: Surgery

## 2023-01-15 DIAGNOSIS — M75122 Complete rotator cuff tear or rupture of left shoulder, not specified as traumatic: Secondary | ICD-10-CM | POA: Diagnosis not present

## 2023-01-15 DIAGNOSIS — M25512 Pain in left shoulder: Secondary | ICD-10-CM | POA: Diagnosis not present

## 2023-01-15 DIAGNOSIS — M7582 Other shoulder lesions, left shoulder: Secondary | ICD-10-CM | POA: Diagnosis not present

## 2023-01-16 ENCOUNTER — Encounter: Payer: Self-pay | Admitting: Internal Medicine

## 2023-01-16 DIAGNOSIS — M25512 Pain in left shoulder: Secondary | ICD-10-CM | POA: Insufficient documentation

## 2023-01-18 ENCOUNTER — Telehealth: Payer: Self-pay

## 2023-01-18 NOTE — Patient Outreach (Signed)
  Care Coordination   01/18/2023 Name: Kristi Greer MRN: [id number] DOB: [birth year]   Care Coordination Outreach Attempts:  Successful contact made with patient.  Patient states she is unable talk at this time. She request call back at a later date.   Follow Up Plan:  Additional outreach attempts will be made to offer the patient care coordination information and services.   Encounter Outcome:  Pt. Request to Call Back   Care Coordination Interventions:  No, not indicated    George Ina [facility] Care Coordination [phone] direct line

## 2023-01-19 ENCOUNTER — Ambulatory Visit
Admission: RE | Admit: 2023-01-19 | Discharge: 2023-01-19 | Disposition: A | Discharge status: Home / Self Care | Payer: Medicare Other | Source: Ambulatory Visit | Attending: Surgery | Admitting: Surgery

## 2023-01-19 DIAGNOSIS — M75122 Complete rotator cuff tear or rupture of left shoulder, not specified as traumatic: Secondary | ICD-10-CM | POA: Diagnosis not present

## 2023-01-19 DIAGNOSIS — M7582 Other shoulder lesions, left shoulder: Secondary | ICD-10-CM | POA: Insufficient documentation

## 2023-01-19 DIAGNOSIS — M19012 Primary osteoarthritis, left shoulder: Secondary | ICD-10-CM | POA: Diagnosis not present

## 2023-02-06 ENCOUNTER — Ambulatory Visit (INDEPENDENT_AMBULATORY_CARE_PROVIDER_SITE_OTHER): Payer: Medicare Other

## 2023-02-06 ENCOUNTER — Ambulatory Visit (INDEPENDENT_AMBULATORY_CARE_PROVIDER_SITE_OTHER): Payer: Medicare Other | Admitting: Podiatry

## 2023-02-06 ENCOUNTER — Telehealth: Payer: Self-pay | Admitting: *Deleted

## 2023-02-06 DIAGNOSIS — R739 Hyperglycemia, unspecified: Secondary | ICD-10-CM

## 2023-02-06 DIAGNOSIS — M778 Other enthesopathies, not elsewhere classified: Secondary | ICD-10-CM

## 2023-02-06 DIAGNOSIS — M79671 Pain in right foot: Secondary | ICD-10-CM

## 2023-02-06 DIAGNOSIS — E7800 Pure hypercholesterolemia, unspecified: Secondary | ICD-10-CM

## 2023-02-06 DIAGNOSIS — I5022 Chronic systolic (congestive) heart failure: Secondary | ICD-10-CM

## 2023-02-06 MED ORDER — BETAMETHASONE SOD PHOS & ACET 6 (3-3) MG/ML IJ SUSP
3.0000 mg | Freq: Once | INTRAMUSCULAR | Status: AC
Start: 2023-02-06 — End: 2023-02-06
  Administered 2023-02-06: 3 mg via INTRA_ARTICULAR

## 2023-02-06 MED ORDER — METHYLPREDNISOLONE 4 MG PO TBPK
ORAL_TABLET | ORAL | 0 refills | Status: DC
Start: 2023-02-06 — End: 2023-02-12

## 2023-02-06 MED ORDER — MELOXICAM 15 MG PO TABS: 15.0000 mg | ORAL_TABLET | Freq: Every day | ORAL | 1 refills | Status: DC

## 2023-02-06 NOTE — Progress Notes (Signed)
Chief Complaint  Patient presents with   Foot Pain    Patient came in today for right foot pain, top of the foot medial side, started 3 months ago, throbbing at night, aches during the day, rate of pain 5 out of 10,     HPI: 72 y.o. female presenting today as a new patient for evaluation of pain and tenderness associated to the dorsal medial aspect of the right foot ongoing for about 3 months.  Patient has not done anything for treatment.  Idiopathic onset.  She was hoping that the pain would simply resolve uneventfully but she continues to have pain on a daily basis.  Presenting for further treatment evaluation  Past Medical History:  Diagnosis Date   Allergy    Anemia    Arthritis    Atrial fibrillation (HCC)    Cardiomyopathy, nonischemic (HCC)    a.) felt to be tachycardia mediated NICM; b.) TTE 09/01/2015: EF 35-40%; c.) [facility] 11/26/2015: ED 30-35%, norm cors, mPA 16, PCWP 7, CO 4.7, CI 2.8; d.) TTE 01/31/2016: EF 40-45%; e.) cMRI 04/27/2016: EF 48%; f.) TTE 12/06/2016: EF 55-60%; g.) TTE 03/25/2018: EF 50-55%   Carpal tunnel syndrome, bilateral    Chronic back pain    Congestive heart failure (HCC)    a.) TTE 09/01/2015: EF 35-40%, mod MR; b.) R/LHC 11/26/2015: EF 30-35%; c.) TTE 01/31/2016: EF 40-45%, mild LVH, diffuse HK, triv MR/TR; d.) TTE 12/06/2016: EF 55-60%, mild MAC, G1DD; e.) TTE 03/25/2018: EF 50-55%, triv PR, G2DD.   Diverticulosis    Dyspnea    with exertion   Environmental and seasonal allergies    Female cystocele    GERD (gastroesophageal reflux disease)    Hiatal hernia    Hyperlipidemia    Hypertension    Hyperthyroidism    Migraines    NSVT (nonsustained ventricular tachycardia) (HCC) 11/16/2015   a.) felt to be related to NICM in the setting of increased stressors, influenza A, hypotension Tx'd with rapid 6L IVF bolus   Overactive detrusor    Personal history of colonic polyps    PONV (postoperative nausea and vomiting)    Skin cancer, basal cell     Squamous cell skin cancer    SUI (stress urinary incontinence, female)    SVT (supraventricular tachycardia) 08/31/2015   a.) rates as high as 150 bpm; b.) EP feels as if event was "long RP tachycardia"   Uterovaginal prolapse    Vaginal atrophy     Past Surgical History:  Procedure Laterality Date   ANTERIOR AND POSTERIOR VAGINAL REPAIR W/ SACROSPINOUS LIGAMENT SUSPENSION  10/18/2012   bilateral trigger finger surgery      CARDIAC CATHETERIZATION N/A 11/26/2015   Procedure: Right/Left Heart Cath and Coronary Angiography;  Surgeon: Dolores Patty, MD;  Location: [facility] INVASIVE CV LAB;  Service: Cardiovascular;  Laterality: N/A;   carpal tunnel on right      CATARACT EXTRACTION Bilateral    CHOLECYSTECTOMY  2006   COLONOSCOPY WITH PROPOFOL N/A 03/19/2017   Procedure: COLONOSCOPY WITH PROPOFOL;  Surgeon: Christena Deem, MD;  Location: [facility] ENDOSCOPY;  Service: Endoscopy;  Laterality: N/A;   COLPOPEXY  10/18/2012   CYSTOSCOPY WITH BIOPSY N/A 04/21/2022   Procedure: CYSTOSCOPY WITH BLADDER BIOPSY;  Surgeon: Sondra Come, MD;  Location: [facility] ORS;  Service: Urology;  Laterality: N/A;   CYSTOURETHROSCOPY  10/18/2012   ESOPHAGOGASTRODUODENOSCOPY (EGD) WITH PROPOFOL N/A 10/03/2016   Procedure: ESOPHAGOGASTRODUODENOSCOPY (EGD) WITH PROPOFOL;  Surgeon: Christena Deem, MD;  Location:  [facility] ENDOSCOPY;  Service: Endoscopy;  Laterality: N/A;   HYSTEROSCOPY  2011, 10/2010   OOPHORECTOMY     REVERSE SHOULDER ARTHROPLASTY Right 06/17/2019   Procedure: REVERSE SHOULDER ARTHROPLASTY;  Surgeon: Christena Flake, MD;  Location: [facility] ORS;  Service: Orthopedics;  Laterality: Right;   right shoulder replacement      ROBOTIC ASSISTED LAPAROSCOPIC SACROCOLPOPEXY N/A 06/21/2022   Procedure: XI ROBOTIC ASSISTED LAPAROSCOPIC SACROCOLPOPEXY;  Surgeon: Crist Fat, MD;  Location: [facility] ORS;  Service: Urology;  Laterality: N/A;  240 MINUTES   TONSILLECTOMY     TRIGGER FINGER RELEASE     TUBAL LIGATION      VAGINAL HYSTERECTOMY     midurethral sling    No Known Allergies   Physical Exam: General: The patient is alert and oriented x3 in no acute distress.  Dermatology: Skin is warm, dry and supple bilateral lower extremities.   Vascular: Palpable pedal pulses bilaterally. Capillary refill within normal limits.  No appreciable edema with exception of a small focal area around patient's pain.  No erythema.  Neurological: Grossly intact via light touch  Musculoskeletal Exam: No pedal deformities noted.  There is tenderness to palpation around the first TMT of the right foot with mild localized edema to this area.  Radiographic Exam RT foot 02/06/2023:  Normal osseous mineralization. Joint spaces preserved.  No fractures or osseous irregularities noted.  Impression: Negative  Assessment/Plan of Care: 1.  First TMT capsulitis right  -Injection of 0.5 cc Celestone Soluspan injected around the first TMT right -Patient declined cam boot.  Recommend good supportive tennis shoes and sneakers that do not irritate or rub this area of the foot -Prescription for Medrol Dosepak -Prescription for meloxicam 15 mg daily after completion of the Dosepak -RICE -Return to clinic 4 weeks       Felecia Shelling, DPM [facility]  Dr. Felecia Shelling, DPM    [address] [place], [place] [address]                Office [phone]  Fax [phone]

## 2023-02-06 NOTE — Telephone Encounter (Signed)
Order placed for labs.

## 2023-02-06 NOTE — Telephone Encounter (Signed)
Please place future orders for lab appt.  

## 2023-02-08 ENCOUNTER — Other Ambulatory Visit (INDEPENDENT_AMBULATORY_CARE_PROVIDER_SITE_OTHER): Payer: Medicare Other

## 2023-02-08 DIAGNOSIS — E7800 Pure hypercholesterolemia, unspecified: Secondary | ICD-10-CM

## 2023-02-08 DIAGNOSIS — R739 Hyperglycemia, unspecified: Secondary | ICD-10-CM

## 2023-02-08 DIAGNOSIS — I5022 Chronic systolic (congestive) heart failure: Secondary | ICD-10-CM | POA: Diagnosis not present

## 2023-02-08 LAB — CBC WITH DIFFERENTIAL/PLATELET
Basophils Absolute: 0 10*3/uL (ref 0.0–0.1)
Basophils Relative: 0.1 % (ref 0.0–3.0)
Eosinophils Absolute: 0 10*3/uL (ref 0.0–0.7)
Eosinophils Relative: 0.1 % (ref 0.0–5.0)
HCT: 42.9 % (ref 36.0–46.0)
Hemoglobin: 13.7 g/dL (ref 12.0–15.0)
Lymphocytes Relative: 19.7 % (ref 12.0–46.0)
Lymphs Abs: 2.8 10*3/uL (ref 0.7–4.0)
MCHC: 32 g/dL (ref 30.0–36.0)
MCV: 89.4 fl (ref 78.0–100.0)
Monocytes Absolute: 0.8 10*3/uL (ref 0.1–1.0)
Monocytes Relative: 5.4 % (ref 3.0–12.0)
Neutro Abs: 10.7 10*3/uL — ABNORMAL HIGH (ref 1.4–7.7)
Neutrophils Relative %: 74.7 % (ref 43.0–77.0)
Platelets: 229 10*3/uL (ref 150.0–400.0)
RBC: 4.8 Mil/uL (ref 3.87–5.11)
RDW: 14.4 % (ref 11.5–15.5)
WBC: 14.4 10*3/uL — ABNORMAL HIGH (ref 4.0–10.5)

## 2023-02-08 LAB — BASIC METABOLIC PANEL
BUN: 25 mg/dL — ABNORMAL HIGH (ref 6–23)
CO2: 29 mEq/L (ref 19–32)
Calcium: 9.7 mg/dL (ref 8.4–10.5)
Chloride: 105 mEq/L (ref 96–112)
Creatinine, Ser: 0.85 mg/dL (ref 0.40–1.20)
GFR: 69.55 mL/min (ref >60.00)
Glucose, Bld: 102 mg/dL — ABNORMAL HIGH (ref 70–99)
Potassium: 4.1 mEq/L (ref 3.5–5.1)
Sodium: 140 mEq/L (ref 135–145)

## 2023-02-08 LAB — LIPID PANEL
Cholesterol: 152 mg/dL (ref 0–200)
HDL: 55.4 mg/dL (ref >39.00)
LDL Cholesterol: 73 mg/dL (ref 0–99)
NonHDL: 96.95
Total CHOL/HDL Ratio: 3
Triglycerides: 119 mg/dL (ref 0.0–149.0)
VLDL: 23.8 mg/dL (ref 0.0–40.0)

## 2023-02-08 LAB — HEPATIC FUNCTION PANEL
ALT: 12 U/L (ref 0–35)
AST: 16 U/L (ref 0–37)
Albumin: 4.1 g/dL (ref 3.5–5.2)
Alkaline Phosphatase: 107 U/L (ref 39–117)
Bilirubin, Direct: 0.1 mg/dL (ref 0.0–0.3)
Total Bilirubin: 0.4 mg/dL (ref 0.2–1.2)
Total Protein: 6.9 g/dL (ref 6.0–8.3)

## 2023-02-08 LAB — HEMOGLOBIN A1C: Hgb A1c MFr Bld: 5.9 % (ref 4.6–6.5)

## 2023-02-12 ENCOUNTER — Ambulatory Visit (INDEPENDENT_AMBULATORY_CARE_PROVIDER_SITE_OTHER): Payer: Medicare Other | Admitting: Internal Medicine

## 2023-02-12 VITALS — BP 124/70 | HR 60 | Temp 98.0°F | Resp 16 | Ht 61.0 in | Wt 161.0 lb

## 2023-02-12 DIAGNOSIS — R739 Hyperglycemia, unspecified: Secondary | ICD-10-CM

## 2023-02-12 DIAGNOSIS — Z01818 Encounter for other preprocedural examination: Secondary | ICD-10-CM

## 2023-02-12 DIAGNOSIS — G43709 Chronic migraine without aura, not intractable, without status migrainosus: Secondary | ICD-10-CM | POA: Diagnosis not present

## 2023-02-12 DIAGNOSIS — E7800 Pure hypercholesterolemia, unspecified: Secondary | ICD-10-CM | POA: Diagnosis not present

## 2023-02-12 DIAGNOSIS — I420 Dilated cardiomyopathy: Secondary | ICD-10-CM

## 2023-02-12 DIAGNOSIS — K219 Gastro-esophageal reflux disease without esophagitis: Secondary | ICD-10-CM

## 2023-02-12 DIAGNOSIS — I471 Supraventricular tachycardia, unspecified: Secondary | ICD-10-CM | POA: Diagnosis not present

## 2023-02-12 DIAGNOSIS — E0590 Thyrotoxicosis, unspecified without thyrotoxic crisis or storm: Secondary | ICD-10-CM

## 2023-02-12 DIAGNOSIS — I5022 Chronic systolic (congestive) heart failure: Secondary | ICD-10-CM

## 2023-02-12 DIAGNOSIS — Z0000 Encounter for general adult medical examination without abnormal findings: Secondary | ICD-10-CM

## 2023-02-12 DIAGNOSIS — D72829 Elevated white blood cell count, unspecified: Secondary | ICD-10-CM | POA: Diagnosis not present

## 2023-02-12 DIAGNOSIS — N814 Uterovaginal prolapse, unspecified: Secondary | ICD-10-CM

## 2023-02-12 DIAGNOSIS — N200 Calculus of kidney: Secondary | ICD-10-CM

## 2023-02-12 DIAGNOSIS — F439 Reaction to severe stress, unspecified: Secondary | ICD-10-CM

## 2023-02-12 NOTE — Progress Notes (Signed)
Subjective:    [name], female    DOB: 1951-06-12, 72 y.o.   MRN: [id number]  Patient here for  Chief Complaint  Patient presents with   Annual Exam    HPI With past history of CM, afib, CHF, hypertension and hypercholesterolemia.  Here today to follow up on these issues as well as for a complete physical exam. Previously saw urology. Had cystoscopy - bladder biopsy negative. Is s/p robotic sacrocolpopexy 06/21/22.  Seeing ortho for left shoulder pain. MRI 01/19/23 - Complete supraspinatus tendon tear with 2-3 cm of retraction. There is no focal atrophy. Mild atrophy of all musculature about the shoulders noted. Complete tear of the long head of biceps from the superior labrum. Acromioclavicular osteoarthritis. Type 2 acromion with subacromial spurring also noted. Scheduled for reverse left total shoulder arthroplasty - 03/13/23. Reports she is doing well. No chest pain.  Breathing stable. No increased sob.  No cough or congestion.  No bowel change.  Occasionally will notice abdominal discomfort.  No known trigger. No nausea or vomiting.  No abdominal pain today.    Past Medical History:  Diagnosis Date   Allergy    Anemia    Arthritis    Atrial fibrillation (HCC)    Cardiomyopathy, nonischemic (HCC)    a.) felt to be tachycardia mediated NICM; b.) TTE 09/01/2015: EF 35-40%; c.) [facility] 11/26/2015: ED 30-35%, norm cors, mPA 16, PCWP 7, CO 4.7, CI 2.8; d.) TTE 01/31/2016: EF 40-45%; e.) cMRI 04/27/2016: EF 48%; f.) TTE 12/06/2016: EF 55-60%; g.) TTE 03/25/2018: EF 50-55%   Carpal tunnel syndrome, bilateral    Chronic back pain    Congestive heart failure (HCC)    a.) TTE 09/01/2015: EF 35-40%, mod MR; b.) R/LHC 11/26/2015: EF 30-35%; c.) TTE 01/31/2016: EF 40-45%, mild LVH, diffuse HK, triv MR/TR; d.) TTE 12/06/2016: EF 55-60%, mild MAC, G1DD; e.) TTE 03/25/2018: EF 50-55%, triv PR, G2DD.   Diverticulosis    Dyspnea    with exertion   Environmental and seasonal  allergies    Female cystocele    GERD (gastroesophageal reflux disease)    Hiatal hernia    Hyperlipidemia    Hypertension    Hyperthyroidism    Migraines    NSVT (nonsustained ventricular tachycardia) (HCC) 11/16/2015   a.) felt to be related to NICM in the setting of increased stressors, influenza A, hypotension Tx'd with rapid 6L IVF bolus   Overactive detrusor    Personal history of colonic polyps    PONV (postoperative nausea and vomiting)    Skin cancer, basal cell    Squamous cell skin cancer    SUI (stress urinary incontinence, female)    SVT (supraventricular tachycardia) 08/31/2015   a.) rates as high as 150 bpm; b.) EP feels as if event was "long RP tachycardia"   Uterovaginal prolapse    Vaginal atrophy    Past Surgical History:  Procedure Laterality Date   ANTERIOR AND POSTERIOR VAGINAL REPAIR W/ SACROSPINOUS LIGAMENT SUSPENSION  10/18/2012   bilateral trigger finger surgery      CARDIAC CATHETERIZATION N/A 11/26/2015   Procedure: Right/Left Heart Cath and Coronary Angiography;  Surgeon: Dolores Patty, MD;  Location: [facility] INVASIVE CV LAB;  Service: Cardiovascular;  Laterality: N/A;   carpal tunnel on right      CATARACT EXTRACTION Bilateral    CHOLECYSTECTOMY  2006   COLONOSCOPY WITH PROPOFOL N/A 03/19/2017   Procedure: COLONOSCOPY WITH PROPOFOL;  Surgeon: Christena Deem, MD;  Location: [facility]  ENDOSCOPY;  Service: Endoscopy;  Laterality: N/A;   COLPOPEXY  10/18/2012   CYSTOSCOPY WITH BIOPSY N/A 04/21/2022   Procedure: CYSTOSCOPY WITH BLADDER BIOPSY;  Surgeon: Sondra Come, MD;  Location: [facility] ORS;  Service: Urology;  Laterality: N/A;   CYSTOURETHROSCOPY  10/18/2012   ESOPHAGOGASTRODUODENOSCOPY (EGD) WITH PROPOFOL N/A 10/03/2016   Procedure: ESOPHAGOGASTRODUODENOSCOPY (EGD) WITH PROPOFOL;  Surgeon: Christena Deem, MD;  Location: [facility] ENDOSCOPY;  Service: Endoscopy;  Laterality: N/A;   HYSTEROSCOPY  2011, 10/2010   OOPHORECTOMY     REVERSE SHOULDER  ARTHROPLASTY Right 06/17/2019   Procedure: REVERSE SHOULDER ARTHROPLASTY;  Surgeon: Christena Flake, MD;  Location: [facility] ORS;  Service: Orthopedics;  Laterality: Right;   right shoulder replacement      ROBOTIC ASSISTED LAPAROSCOPIC SACROCOLPOPEXY N/A 06/21/2022   Procedure: XI ROBOTIC ASSISTED LAPAROSCOPIC SACROCOLPOPEXY;  Surgeon: Crist Fat, MD;  Location: [facility] ORS;  Service: Urology;  Laterality: N/A;  240 MINUTES   TONSILLECTOMY     TRIGGER FINGER RELEASE     TUBAL LIGATION     VAGINAL HYSTERECTOMY     midurethral sling   Family History  Problem Relation Age of Onset   Hypertension Mother    Stroke Mother    Diabetes Mother    Heart disease Father    Hypercholesterolemia Father    Hypertension Father    Stroke Father    Heart attack Father    Breast cancer Neg Hx    Colon cancer Neg Hx    Thyroid disease Neg Hx    Social History   Socioeconomic History   Marital status: Widowed    Spouse name: Not on file   Number of children: 2   Years of education: Not on file   Highest education level: Not on file  Occupational History   Not on file  Tobacco Use   Smoking status: Never    Passive exposure: Never   Smokeless tobacco: Never  Vaping Use   Vaping Use: Never used  Substance and Sexual Activity   Alcohol use: Not Currently   Drug use: Never   Sexual activity: Yes    Birth control/protection: Post-menopausal  Other Topics Concern   Not on file  Social History Narrative   Lives alone   Social Determinants of Health   Financial Resource Strain: Low Risk  (11/20/2022)   Overall Financial Resource Strain (CARDIA)    Difficulty of Paying Living Expenses: Not hard at all  Food Insecurity: No Food Insecurity (11/20/2022)   Hunger Vital Sign    Worried About Running Out of Food in the Last Year: Never true    Ran Out of Food in the Last Year: Never true  Transportation Needs: No Transportation Needs (11/20/2022)   PRAPARE - Scientist, research (physical sciences) (Medical): No    Lack of Transportation (Non-Medical): No  Physical Activity: Insufficiently Active (11/20/2022)   Exercise Vital Sign    Days of Exercise per Week: 2 days    Minutes of Exercise per Session: 70 min  Stress: No Stress Concern Present (11/20/2022)   [employer] of Occupational Health - Occupational Stress Questionnaire    Feeling of Stress : Not at all  Social Connections: Moderately Integrated (11/20/2022)   Social Connection and Isolation Panel [NHANES]    Frequency of Communication with Friends and Family: More than three times a week    Frequency of Social Gatherings with Friends and Family: More than three times a week    Attends Religious  Services: More than 4 times per year    Active Member of Clubs or Organizations: Yes    Attends Banker Meetings: Not on file    Marital Status: Widowed     Review of Systems  Constitutional:  Negative for appetite change and unexpected weight change.  HENT:  Negative for congestion, sinus pressure and sore throat.   Eyes:  Negative for pain and visual disturbance.  Respiratory:  Negative for cough and chest tightness.        Breathing stable.   Cardiovascular:  Negative for chest pain, palpitations and leg swelling.  Gastrointestinal:  Negative for abdominal pain, diarrhea, nausea and vomiting.  Genitourinary:  Negative for difficulty urinating and dysuria.  Musculoskeletal:  Negative for myalgias.       Shoulder pain as outlined.    Skin:  Negative for color change and rash.  Neurological:  Negative for dizziness and headaches.  Hematological:  Negative for adenopathy. Does not bruise/bleed easily.  Psychiatric/Behavioral:  Negative for agitation and dysphoric mood.        Objective:     BP 124/70   Pulse 60   Temp 98 F (36.7 C)   Resp 16   Ht 5\' 1"  (1.549 m)   Wt 161 lb (73 kg)   LMP 10/18/2012   SpO2 98%   BMI 30.42 kg/m  Wt Readings from Last 3 Encounters:  02/12/23 161 lb  (73 kg)  11/20/22 157 lb (71.2 kg)  10/13/22 162 lb 6.4 oz (73.7 kg)    Physical Exam Vitals reviewed.  Constitutional:      General: She is not in acute distress.    Appearance: Normal appearance. She is well-developed.  HENT:     Head: Normocephalic and atraumatic.     Right Ear: External ear normal.     Left Ear: External ear normal.  Eyes:     General: No scleral icterus.       Right eye: No discharge.        Left eye: No discharge.     Conjunctiva/sclera: Conjunctivae normal.  Neck:     Thyroid: No thyromegaly.  Cardiovascular:     Rate and Rhythm: Normal rate and regular rhythm.  Pulmonary:     Effort: No tachypnea, accessory muscle usage or respiratory distress.     Breath sounds: Normal breath sounds. No decreased breath sounds or wheezing.  Chest:  Breasts:    Right: No inverted nipple, mass, nipple discharge or tenderness (no axillary adenopathy).     Left: No inverted nipple, mass, nipple discharge or tenderness (no axilarry adenopathy).  Abdominal:     General: Bowel sounds are normal.     Palpations: Abdomen is soft.     Tenderness: There is no abdominal tenderness.  Musculoskeletal:        General: No swelling or tenderness.     Cervical back: Neck supple.  Lymphadenopathy:     Cervical: No cervical adenopathy.  Skin:    Findings: No erythema or rash.  Neurological:     Mental Status: She is alert and oriented to person, place, and time.  Psychiatric:        Mood and Affect: Mood normal.        Behavior: Behavior normal.      Outpatient Encounter Medications as of 02/12/2023  Medication Sig   acetaminophen (TYLENOL) 500 MG tablet Take 500-1,000 mg by mouth every 6 (six) hours as needed for moderate pain or headache.   acetaminophen (TYLENOL) 650  MG CR tablet Take 650 mg by mouth 2 (two) times daily with a meal.   calcium carbonate (TUMS - DOSED IN MG ELEMENTAL CALCIUM) 500 MG chewable tablet Chew 1 tablet by mouth daily as needed for indigestion or  heartburn.   carvedilol (COREG) 12.5 MG tablet TAKE 1 TABLET (12.5MG  TOTAL) BY MOUTH TWICE A DAY WITH MEALS   docusate sodium (COLACE) 100 MG capsule Take 1 capsule (100 mg total) by mouth 2 (two) times daily.   estradiol (ESTRACE VAGINAL) 0.1 MG/GM vaginal cream Place 1 Applicatorful vaginally 3 (three) times a week. Use 1 small dolyp of cream on tip of index finger and swap the inside of the vagina   fexofenadine (ALLEGRA) 180 MG tablet Take 180 mg by mouth at bedtime.    furosemide (LASIX) 20 MG tablet TAKE 1 TABLET (20 MG TOTAL) BY MOUTH EVERY OTHER DAY. (Patient taking differently: Take 20 mg by mouth daily as needed (only when home).)   meloxicam (MOBIC) 15 MG tablet Take 1 tablet (15 mg total) by mouth daily.   methimazole (TAPAZOLE) 5 MG tablet Take 0.5 tablets (2.5 mg total) by mouth daily.   rosuvastatin (CRESTOR) 10 MG tablet TAKE 1 TABLET BY MOUTH EVERY DAY   SUMAtriptan (IMITREX) 100 MG tablet TAKE 1 TABLET BY MOUTH ONCE. MAY REPEAT IN 2 HOURS IF HEADACHE PERSISTS OR RECURS.   triamcinolone (NASACORT) 55 MCG/ACT AERO nasal inhaler Place 1 spray into the nose daily as needed (allergies).   [DISCONTINUED] methylPREDNISolone (MEDROL DOSEPAK) 4 MG TBPK tablet 6 day dose pack - take as directed   [DISCONTINUED] zonisamide (ZONEGRAN) 100 MG capsule Take 1 capsule (100 mg total) by mouth daily.   No facility-administered encounter medications on file as of 02/12/2023.     Lab Results  Component Value Date   WBC 14.4 (H) 02/08/2023   HGB 13.7 02/08/2023   HCT 42.9 02/08/2023   PLT 229.0 02/08/2023   GLUCOSE 102 (H) 02/08/2023   CHOL 152 02/08/2023   TRIG 119.0 02/08/2023   HDL 55.40 02/08/2023   LDLCALC 73 02/08/2023   ALT 12 02/08/2023   AST 16 02/08/2023   NA 140 02/08/2023   K 4.1 02/08/2023   CL 105 02/08/2023   CREATININE 0.85 02/08/2023   BUN 25 (H) 02/08/2023   CO2 29 02/08/2023   TSH 0.84 11/08/2022   INR 0.98 11/26/2015   HGBA1C 5.9 02/08/2023    MR SHOULDER LEFT  WO CONTRAST  Result Date: 02/01/2023 CLINICAL DATA:  Left shoulder pain and limited range of motion for 6 months. No known injury. EXAM: MRI OF THE LEFT SHOULDER WITHOUT CONTRAST TECHNIQUE: Multiplanar, multisequence MR imaging of the shoulder was performed. No intravenous contrast was administered. COMPARISON:  None Available. FINDINGS: Rotator cuff: The supraspinatus is completely torn and retracted 2-3 cm. Subscapularis and infraspinatus are intact with tendinopathy noted. Muscles: There is mild-to-moderate fatty atrophy of all musculature about the shoulder. Biceps long head:  Completely torn from the superior labrum. Acromioclavicular Joint: Moderate osteoarthritis. Type 2 acromion. There is subacromial spurring. A small volume of fluid is seen in the subacromial/subdeltoid bursa. Glenohumeral Joint: Mild degenerative change is present with cartilage thinning and a very small osteophyte off the humeral head. Labrum: The superior labrum is blunted at the site of the biceps tendon tear. Bones:  No fracture, contusion or focal lesion. Other: None. IMPRESSION: Complete supraspinatus tendon tear with 2-3 cm of retraction. There is no focal atrophy. Mild atrophy of all musculature about the shoulders noted.  Complete tear of the long head of biceps from the superior labrum. Acromioclavicular osteoarthritis. Type 2 acromion with subacromial spurring also noted. Electronically Signed   By: Drusilla Kanner M.D.   On: 02/01/2023 12:47       Assessment & Plan:  Health care maintenance Assessment & Plan: Physical today 02/12/23.  Mammogram 11/23/22 - Birads I.  Colonoscopy 02/2017.  Recommended f/u in 5 years.  Had follow up colonoscopy 01/2021. Need results.    Leukocytosis, unspecified type -     CBC with Differential/Platelet; Future  Chronic migraine without aura without status migrainosus, not intractable Assessment & Plan: On zonegran.  No headaches now.  Follow.     Chronic systolic heart failure  (HCC) Assessment & Plan: Continue coreg and lasix prn. No evidence of volume overload.  Follow.    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HCC) Assessment & Plan: No evidence of volume overload.  On coreg and lasix prn.  Follow. Follow metabolic panel.    Cystocele with prolapse Assessment & Plan:  Seeing urology. Had cystoscopy - bladder biopsy negative. Is s/p robotic sacrocolpopexy 06/21/22.    Gastroesophageal reflux disease, unspecified whether esophagitis present Assessment & Plan: Upper symptoms controlled on protonix.     Hypercholesterolemia Assessment & Plan: On crestor.  Low cholesterol diet and exercise.  Follow lipid panel and liver function tests.     Hyperglycemia Assessment & Plan: Low carb diet and exercise.  Follow met b and a1c.    Hyperthyroidism Assessment & Plan: Thyrotoxicosis - Dr Wyonia Hough (08/02/22) - Thyroid tests remain controlled. However, free T3 is at the upper limit of the target range, so for now I would suggest to continue the low-dose of methimazole. We will plan to repeat her TFTs in 3 months. Repeat TFTs - wnl.  Recommended continuing methimazoloe.  F/u 07/2023.    Nephrolithiasis Assessment & Plan: Saw Dr Richardo Hanks 04/12/22 - Schedule cystoscopy, stone evacuation, bladder biopsy and fulguration Bladder biopsy 04/21/22 -  URINARY BLADDER; TRANSURETHRAL BIOPSY:  - FOLLICULAR CYSTITIS.  - NEGATIVE FOR MALIGNANCY.    Stress Assessment & Plan: Doing well.  Feels good.  Has good family support. Follow.    SVT (supraventricular tachycardia) Assessment & Plan: On coreg.  No increased heart rate or palpitations.  Follow.    Pre-op evaluation Assessment & Plan: Planning shoulder surgery 03/13/23.  Sees cardiology regularly.  Plans pre op evaluation per cardiology.  Currently denies chest pain.  Breathing stable.  No increased cough or congestion.  Blood pressure doing well.        Dale Verona, MD

## 2023-02-12 NOTE — Assessment & Plan Note (Addendum)
Physical today 02/12/23.  Mammogram 11/23/22 - Birads I.  Colonoscopy 02/2017.  Recommended f/u in 5 years.  Had follow up colonoscopy 01/2021. Need results.

## 2023-02-13 ENCOUNTER — Telehealth: Payer: Self-pay | Admitting: *Deleted

## 2023-02-13 ENCOUNTER — Encounter: Payer: Self-pay | Admitting: Internal Medicine

## 2023-02-13 ENCOUNTER — Telehealth: Payer: Self-pay | Admitting: Cardiovascular Disease

## 2023-02-13 NOTE — Telephone Encounter (Signed)
   Pre-operative Risk Assessment    Patient Name: Kristi Greer  DOB: 1950-12-20 MRN: [id number]     Request for Surgical Clearance    Procedure:  Left Reverse total shoulder  Date of Surgery:  Clearance 03/13/23                                 Surgeon:  Dr. Derryl Harbor MD Surgeon's Group or Practice Name:  [facility] Phone number:  [phone] Fax number:  [phone]   Type of Clearance Requested:   - Medical    Type of Anesthesia:  Not Indicated   Additional requests/questions:    Signed, Narda Amber   02/13/2023, 2:22 PM

## 2023-02-13 NOTE — Telephone Encounter (Signed)
Pt has been scheduled for tele pre op appt 03/05/23. Pt is going to be at the beach the week before so she was not available. Med rec and consent are done.      Patient Consent for Virtual Visit        Kristi Greer has provided verbal consent on 02/13/2023 for a virtual visit (video or telephone).   CONSENT FOR VIRTUAL VISIT FOR:  Kristi Greer  By participating in this virtual visit I agree to the following:  I hereby voluntarily request, consent and authorize [facility] and its employed or contracted physicians, physician assistants, nurse practitioners or other licensed health care professionals (the Practitioner), to provide me with telemedicine health care services (the "Services") as deemed necessary by the treating Practitioner. I acknowledge and consent to receive the Services by the Practitioner via telemedicine. I understand that the telemedicine visit will involve communicating with the Practitioner through live audiovisual communication technology and the disclosure of certain medical information by electronic transmission. I acknowledge that I have been given the opportunity to request an in-person assessment or other available alternative prior to the telemedicine visit and am voluntarily participating in the telemedicine visit.  I understand that I have the right to withhold or withdraw my consent to the use of telemedicine in the course of my care at any time, without affecting my right to future care or treatment, and that the Practitioner or I may terminate the telemedicine visit at any time. I understand that I have the right to inspect all information obtained and/or recorded in the course of the telemedicine visit and may receive copies of available information for a reasonable fee.  I understand that some of the potential risks of receiving the Services via telemedicine include:  Delay or interruption in medical evaluation due to technological equipment  failure or disruption; Information transmitted may not be sufficient (e.g. poor resolution of images) to allow for appropriate medical decision making by the Practitioner; and/or  In rare instances, security protocols could fail, causing a breach of personal health information.  Furthermore, I acknowledge that it is my responsibility to provide information about my medical history, conditions and care that is complete and accurate to the best of my ability. I acknowledge that Practitioner's advice, recommendations, and/or decision may be based on factors not within their control, such as incomplete or inaccurate data provided by me or distortions of diagnostic images or specimens that may result from electronic transmissions. I understand that the practice of medicine is not an exact science and that Practitioner makes no warranties or guarantees regarding treatment outcomes. I acknowledge that a copy of this consent can be made available to me via my patient portal [facility] MyChart), or I can request a printed copy by calling the office of [facility].    I understand that my insurance will be billed for this visit.   I have read or had this consent read to me. I understand the contents of this consent, which adequately explains the benefits and risks of the Services being provided via telemedicine.  I have been provided ample opportunity to ask questions regarding this consent and the Services and have had my questions answered to my satisfaction. I give my informed consent for the services to be provided through the use of telemedicine in my medical care

## 2023-02-13 NOTE — Telephone Encounter (Signed)
Pt has been scheduled for tele pre op appt 03/05/23. Pt is going to be at the beach the week before so she was not available. Med rec and consent are done

## 2023-02-13 NOTE — Telephone Encounter (Signed)
   Name: Kristi Greer  DOB: June 02, 1951  MRN: [id number]  Primary Cardiologist: Julien Nordmann, MD   Preoperative team, please contact this patient and set up a phone call appointment for further preoperative risk assessment. Please obtain consent and complete medication review. Thank you for your help.  I confirm that guidance regarding antiplatelet and oral anticoagulation therapy has been completed and, if necessary, noted below (none requested).    Joylene Grapes, NP 02/13/2023, 4:08 PM [facility]

## 2023-02-15 ENCOUNTER — Other Ambulatory Visit: Payer: Self-pay | Admitting: Internal Medicine

## 2023-02-17 ENCOUNTER — Encounter: Payer: Self-pay | Admitting: Internal Medicine

## 2023-02-17 DIAGNOSIS — Z01818 Encounter for other preprocedural examination: Secondary | ICD-10-CM | POA: Insufficient documentation

## 2023-02-17 NOTE — Assessment & Plan Note (Signed)
Planning shoulder surgery 03/13/23.  Sees cardiology regularly.  Plans pre op evaluation per cardiology.  Currently denies chest pain.  Breathing stable.  No increased cough or congestion.  Blood pressure doing well.

## 2023-02-17 NOTE — Assessment & Plan Note (Signed)
Seeing urology. Had cystoscopy - bladder biopsy negative. Is s/p robotic sacrocolpopexy 06/21/22.  

## 2023-02-17 NOTE — Assessment & Plan Note (Signed)
Upper symptoms controlled on protonix.  

## 2023-02-17 NOTE — Assessment & Plan Note (Signed)
Saw Dr Sninsky 04/12/22 - Schedule cystoscopy, stone evacuation, bladder biopsy and fulguration Bladder biopsy 04/21/22 -  URINARY BLADDER; TRANSURETHRAL BIOPSY:  - FOLLICULAR CYSTITIS.  - NEGATIVE FOR MALIGNANCY.  

## 2023-02-17 NOTE — Assessment & Plan Note (Signed)
Low carb diet and exercise.  Follow met b and a1c.   

## 2023-02-17 NOTE — Assessment & Plan Note (Addendum)
No evidence of volume overload.  On coreg and lasix prn.  Follow. Follow metabolic panel.

## 2023-02-17 NOTE — Assessment & Plan Note (Signed)
Thyrotoxicosis - Dr Wyonia Hough (08/02/22) - Thyroid tests remain controlled. However, free T3 is at the upper limit of the target range, so for now I would suggest to continue the low-dose of methimazole. We will plan to repeat her TFTs in 3 months. Repeat TFTs - wnl.  Recommended continuing methimazoloe.  F/u 07/2023.

## 2023-02-17 NOTE — Assessment & Plan Note (Signed)
On zonegran.  No headaches now.  Follow.

## 2023-02-17 NOTE — Assessment & Plan Note (Signed)
On crestor.  Low cholesterol diet and exercise.  Follow lipid panel and liver function tests.   

## 2023-02-17 NOTE — Assessment & Plan Note (Signed)
Doing well.  Feels good.  Has good family support. Follow.  

## 2023-02-17 NOTE — Assessment & Plan Note (Signed)
On coreg.  No increased heart rate or palpitations.  Follow.  

## 2023-02-17 NOTE — Assessment & Plan Note (Signed)
Continue coreg and lasix prn. No evidence of volume overload.  Follow.  

## 2023-02-23 DIAGNOSIS — M75122 Complete rotator cuff tear or rupture of left shoulder, not specified as traumatic: Secondary | ICD-10-CM | POA: Diagnosis not present

## 2023-02-23 DIAGNOSIS — M7582 Other shoulder lesions, left shoulder: Secondary | ICD-10-CM | POA: Diagnosis not present

## 2023-03-02 NOTE — Progress Notes (Unsigned)
Virtual Visit via Telephone Note   Because of Kristi Greer's co-morbid illnesses, she is at least at moderate risk for complications without adequate follow up.  This format is felt to be most appropriate for this patient at this time.  The patient did not have access to video technology/had technical difficulties with video requiring transitioning to audio format only (telephone).  All issues noted in this document were discussed and addressed.  No physical exam could be performed with this format.  Please refer to the patient's chart for her consent to telehealth for Kristi Greer.  Evaluation Performed:  Preoperative cardiovascular risk assessment _____________   Date:  03/02/2023   Patient ID:  Kristi, Greer 11-09-1950, MRN [id number] Patient Location:  Home Provider location:   Office  Primary Care Provider:  Dale Wadley, MD Primary Cardiologist:  Julien Nordmann, MD  Chief Complaint / Patient Profile   72 y.o. y/o female with a h/o chronic systolic heart failure/nonischemic cardiomyopathy with recovered LV function, hypotension. SVT/NSVT, migraines, hyperthyroid, GERD who is pending left reverse shoulder by Dr. Joice Lofts and presents today for telephonic preoperative cardiovascular risk assessment.  History of Present Illness    Kristi Greer is a 72 y.o. female who presents via audio/video conferencing for a telehealth visit today.  Pt was last seen in cardiology clinic on 06/20/2023 by Dr. Mariah Milling.  At that time Kristi Greer was doing well.  The patient is now pending procedure as outlined above. Since her last visit, she is doing well from a cardiac standpoint. Patient denies shortness of breath or dyspnea on exertion. No chest pain, pressure, or tightness. Denies lower extremity edema, orthopnea, or PND. No palpitations. Her activity has been limited for the last 3 weeks or so secondary to foot pain. She was going to the gym to walk on the  treadmill but the podiatrist wanted her to stop wearing shoes that go across her foot until inflammation resolved. She is still able to perform yard work and all her light to moderate household chores.   Past Medical History    Past Medical History:  Diagnosis Date   Allergy    Anemia    Arthritis    Atrial fibrillation (HCC)    Cardiomyopathy, nonischemic (HCC)    a.) felt to be tachycardia mediated NICM; b.) TTE 09/01/2015: EF 35-40%; c.) [facility] 11/26/2015: ED 30-35%, norm cors, mPA 16, PCWP 7, CO 4.7, CI 2.8; d.) TTE 01/31/2016: EF 40-45%; e.) cMRI 04/27/2016: EF 48%; f.) TTE 12/06/2016: EF 55-60%; g.) TTE 03/25/2018: EF 50-55%   Carpal tunnel syndrome, bilateral    Chronic back pain    Congestive heart failure (HCC)    a.) TTE 09/01/2015: EF 35-40%, mod MR; b.) R/LHC 11/26/2015: EF 30-35%; c.) TTE 01/31/2016: EF 40-45%, mild LVH, diffuse HK, triv MR/TR; d.) TTE 12/06/2016: EF 55-60%, mild MAC, G1DD; e.) TTE 03/25/2018: EF 50-55%, triv PR, G2DD.   Diverticulosis    Dyspnea    with exertion   Environmental and seasonal allergies    Female cystocele    GERD (gastroesophageal reflux disease)    Hiatal hernia    Hyperlipidemia    Hypertension    Hyperthyroidism    Migraines    NSVT (nonsustained ventricular tachycardia) (HCC) 11/16/2015   a.) felt to be related to NICM in the setting of increased stressors, influenza A, hypotension Tx'd with rapid 6L IVF bolus   Overactive detrusor    Personal history of colonic polyps  PONV (postoperative nausea and vomiting)    Skin cancer, basal cell    Squamous cell skin cancer    SUI (stress urinary incontinence, female)    SVT (supraventricular tachycardia) 08/31/2015   a.) rates as high as 150 bpm; b.) EP feels as if event was "long RP tachycardia"   Uterovaginal prolapse    Vaginal atrophy    Past Surgical History:  Procedure Laterality Date   ANTERIOR AND POSTERIOR VAGINAL REPAIR W/ SACROSPINOUS LIGAMENT SUSPENSION  10/18/2012    bilateral trigger finger surgery      CARDIAC CATHETERIZATION N/A 11/26/2015   Procedure: Right/Left Heart Cath and Coronary Angiography;  Surgeon: Dolores Patty, MD;  Location: [facility] INVASIVE CV LAB;  Service: Cardiovascular;  Laterality: N/A;   carpal tunnel on right      CATARACT EXTRACTION Bilateral    CHOLECYSTECTOMY  2006   COLONOSCOPY WITH PROPOFOL N/A 03/19/2017   Procedure: COLONOSCOPY WITH PROPOFOL;  Surgeon: Christena Deem, MD;  Location: [facility] ENDOSCOPY;  Service: Endoscopy;  Laterality: N/A;   COLPOPEXY  10/18/2012   CYSTOSCOPY WITH BIOPSY N/A 04/21/2022   Procedure: CYSTOSCOPY WITH BLADDER BIOPSY;  Surgeon: Sondra Come, MD;  Location: [facility] ORS;  Service: Urology;  Laterality: N/A;   CYSTOURETHROSCOPY  10/18/2012   ESOPHAGOGASTRODUODENOSCOPY (EGD) WITH PROPOFOL N/A 10/03/2016   Procedure: ESOPHAGOGASTRODUODENOSCOPY (EGD) WITH PROPOFOL;  Surgeon: Christena Deem, MD;  Location: [facility] ENDOSCOPY;  Service: Endoscopy;  Laterality: N/A;   HYSTEROSCOPY  2011, 10/2010   OOPHORECTOMY     REVERSE SHOULDER ARTHROPLASTY Right 06/17/2019   Procedure: REVERSE SHOULDER ARTHROPLASTY;  Surgeon: Christena Flake, MD;  Location: [facility] ORS;  Service: Orthopedics;  Laterality: Right;   right shoulder replacement      ROBOTIC ASSISTED LAPAROSCOPIC SACROCOLPOPEXY N/A 06/21/2022   Procedure: XI ROBOTIC ASSISTED LAPAROSCOPIC SACROCOLPOPEXY;  Surgeon: Crist Fat, MD;  Location: [facility] ORS;  Service: Urology;  Laterality: N/A;  240 MINUTES   TONSILLECTOMY     TRIGGER FINGER RELEASE     TUBAL LIGATION     VAGINAL HYSTERECTOMY     midurethral sling    Allergies  No Known Allergies  Home Medications    Prior to Admission medications   Medication Sig Start Date End Date Taking? Authorizing Provider  acetaminophen (TYLENOL) 500 MG tablet Take 500-1,000 mg by mouth every 6 (six) hours as needed for moderate pain or headache.    [provider]  acetaminophen (TYLENOL) 650 MG CR  tablet Take 650 mg by mouth 2 (two) times daily with a meal.    [provider]  calcium carbonate (TUMS - DOSED IN MG ELEMENTAL CALCIUM) 500 MG chewable tablet Chew 1 tablet by mouth daily as needed for indigestion or heartburn.    [provider]  carvedilol (COREG) 12.5 MG tablet TAKE 1 TABLET (12.5MG  TOTAL) BY MOUTH TWICE A DAY WITH MEALS 08/22/22   Gollan, Tollie Pizza, MD  docusate sodium (COLACE) 100 MG capsule Take 1 capsule (100 mg total) by mouth 2 (two) times daily. 06/21/22   Harrie Foreman, PA-C  estradiol (ESTRACE VAGINAL) 0.1 MG/GM vaginal cream Place 1 Applicatorful vaginally 3 (three) times a week. Use 1 small dolyp of cream on tip of index finger and swap the inside of the vagina 06/21/22   Crist Fat, MD  fexofenadine (ALLEGRA) 180 MG tablet Take 180 mg by mouth at bedtime.     [provider]  furosemide (LASIX) 20 MG tablet TAKE 1 TABLET (20 MG TOTAL) BY MOUTH  EVERY OTHER DAY. Patient taking differently: Take 20 mg by mouth daily as needed (only when home). 02/03/19   Bensimhon, Bevelyn Buckles, MD  meloxicam (MOBIC) 15 MG tablet Take 1 tablet (15 mg total) by mouth daily. 02/06/23   Felecia Shelling, DPM  methimazole (TAPAZOLE) 5 MG tablet Take 0.5 tablets (2.5 mg total) by mouth daily. 08/02/22   Carlus Pavlov, MD  rosuvastatin (CRESTOR) 10 MG tablet TAKE 1 TABLET BY MOUTH EVERY DAY 08/29/22   Dale [place], MD  SUMAtriptan (IMITREX) 100 MG tablet TAKE 1 TABLET BY MOUTH ONCE. MAY REPEAT IN 2 HOURS IF HEADACHE PERSISTS OR RECURS. 01/16/23   Dale [place], MD  triamcinolone (NASACORT) 55 MCG/ACT AERO nasal inhaler Place 1 spray into the nose daily as needed (allergies).    [provider]  zonisamide (ZONEGRAN) 100 MG capsule TAKE 1 CAPSULE BY MOUTH EVERY DAY 02/15/23   Dale , MD    Physical Exam    Vital Signs:  Kristi Greer does not have vital signs available for review today.  Given telephonic nature of communication,  physical exam is limited. AAOx3. NAD. Normal affect.  Speech and respirations are unlabored.  Accessory Clinical Findings    None  Assessment & Plan    Primary Cardiologist: Julien Nordmann, MD  Preoperative cardiovascular risk assessment. Left reverse shoulder by Dr. Joice Lofts.  Chart reviewed as part of pre-operative protocol coverage. According to the RCRI, patient has a 0.9% risk of MACE. Patient reports activity equivalent to >4.0 METS (yard work, light to moderate household chores).   Given past medical history and time since last visit, based on ACC/AHA guidelines, Kristi Greer would be at acceptable risk for the planned procedure without further cardiovascular testing.   Patient was advised that if she develops new symptoms prior to surgery to contact our office to arrange a follow-up appointment.  she verbalized understanding.   I will route this recommendation to the requesting party via Epic fax function.  Please call with questions.  Time:   Today, I have spent 5 minutes with the patient with telehealth technology discussing medical history, symptoms, and management plan.     Carlos Levering, NP  03/02/2023, 5:07 PM

## 2023-03-05 ENCOUNTER — Ambulatory Visit: Payer: Medicare Other | Attending: Cardiology | Admitting: Student

## 2023-03-05 ENCOUNTER — Other Ambulatory Visit (INDEPENDENT_AMBULATORY_CARE_PROVIDER_SITE_OTHER): Payer: Medicare Other

## 2023-03-05 ENCOUNTER — Other Ambulatory Visit: Payer: Self-pay | Admitting: Surgery

## 2023-03-05 DIAGNOSIS — D72829 Elevated white blood cell count, unspecified: Secondary | ICD-10-CM | POA: Diagnosis not present

## 2023-03-05 DIAGNOSIS — Z01810 Encounter for preprocedural cardiovascular examination: Secondary | ICD-10-CM

## 2023-03-06 ENCOUNTER — Encounter
Admission: RE | Admit: 2023-03-06 | Discharge: 2023-03-06 | Disposition: A | Discharge status: Home / Self Care | Payer: Medicare Other | Source: Ambulatory Visit | Attending: Surgery | Admitting: Surgery

## 2023-03-06 ENCOUNTER — Other Ambulatory Visit: Payer: Self-pay

## 2023-03-06 VITALS — Resp 16 | Ht 61.0 in | Wt 166.7 lb

## 2023-03-06 DIAGNOSIS — Z01818 Encounter for other preprocedural examination: Secondary | ICD-10-CM | POA: Diagnosis not present

## 2023-03-06 DIAGNOSIS — Z01810 Encounter for preprocedural cardiovascular examination: Secondary | ICD-10-CM | POA: Diagnosis not present

## 2023-03-06 HISTORY — DX: Other forms of dyspnea: R06.09

## 2023-03-06 HISTORY — DX: Other specified personal risk factors, not elsewhere classified: Z91.89

## 2023-03-06 LAB — CBC WITH DIFFERENTIAL/PLATELET
Basophils Absolute: 0.1 10*3/uL (ref 0.0–0.1)
Basophils Relative: 1.1 % (ref 0.0–3.0)
Eosinophils Absolute: 0.4 10*3/uL (ref 0.0–0.7)
Eosinophils Relative: 4.8 % (ref 0.0–5.0)
HCT: 41.4 % (ref 36.0–46.0)
Hemoglobin: 13.3 g/dL (ref 12.0–15.0)
Lymphocytes Relative: 35.2 % (ref 12.0–46.0)
Lymphs Abs: 2.7 10*3/uL (ref 0.7–4.0)
MCHC: 32.2 g/dL (ref 30.0–36.0)
MCV: 89.5 fl (ref 78.0–100.0)
Monocytes Absolute: 0.5 10*3/uL (ref 0.1–1.0)
Monocytes Relative: 6 % (ref 3.0–12.0)
Neutro Abs: 4 10*3/uL (ref 1.4–7.7)
Neutrophils Relative %: 52.9 % (ref 43.0–77.0)
Platelets: 208 10*3/uL (ref 150.0–400.0)
RBC: 4.63 Mil/uL (ref 3.87–5.11)
RDW: 14.2 % (ref 11.5–15.5)
WBC: 7.6 10*3/uL (ref 4.0–10.5)

## 2023-03-06 LAB — URINALYSIS, ROUTINE W REFLEX MICROSCOPIC
Bilirubin Urine: NEGATIVE
Glucose, UA: NEGATIVE mg/dL
Hgb urine dipstick: NEGATIVE
Ketones, ur: NEGATIVE mg/dL
Leukocytes,Ua: NEGATIVE
Nitrite: NEGATIVE
Protein, ur: NEGATIVE mg/dL
Specific Gravity, Urine: 1.019 (ref 1.005–1.030)
pH: 6 (ref 5.0–8.0)

## 2023-03-06 LAB — TYPE AND SCREEN
ABO/RH(D): O POS
Antibody Screen: NEGATIVE

## 2023-03-06 LAB — SURGICAL PCR SCREEN
MRSA, PCR: NEGATIVE
Staphylococcus aureus: NEGATIVE

## 2023-03-06 NOTE — Patient Instructions (Addendum)
Your procedure is scheduled on: 03/13/23 - Tuesday Report to the Registration Desk on the 1st floor of [facility]. To find out your arrival time, please call [phone] between 1PM - 3PM on: 03/12/23 - Monday If your arrival time is 6:00 am, do not arrive before that time as the [facility] entrance doors do not open until 6:00 am.  REMEMBER: Instructions that are not followed completely may result in serious medical risk, up to and including death; or upon the discretion of your surgeon and anesthesiologist your surgery may need to be rescheduled.  Do not eat food after midnight the night before surgery.  No gum chewing or hard candies.  You may however, drink CLEAR liquids up to 2 hours before you are scheduled to arrive for your surgery. Do not drink anything within 2 hours of your scheduled arrival time.  Clear liquids include: - water  - apple juice without pulp - gatorade (not RED colors) - black coffee or tea (Do NOT add milk or creamers to the coffee or tea) Do NOT drink anything that is not on this list.  In addition, your doctor has ordered for you to drink the provided:  Ensure Pre-Surgery Clear Carbohydrate Drink  Drinking this carbohydrate drink up to two hours before surgery helps to reduce insulin resistance and improve patient outcomes. Please complete drinking 2 hours before scheduled arrival time.  One week prior to surgery: Stop taking 03/07/23, meloxicam (MOBIC) and Anti-inflammatories (NSAIDS) such as Advil, Aleve, Ibuprofen, Motrin, Naproxen, Naprosyn and Aspirin based products such as Excedrin, Goody's Powder, BC Powder. You may however, continue to take Tylenol if needed for pain up until the day of surgery.  Stop ANY OVER THE COUNTER supplements until after surgery.   TAKE ONLY THESE MEDICATIONS THE MORNING OF SURGERY WITH A SIP OF WATER:  carvedilol (COREG)  methimazole (TAPAZOLE)    No Alcohol for 24 hours before or after surgery.  No  Smoking including e-cigarettes for 24 hours before surgery.  No chewable tobacco products for at least 6 hours before surgery.  No nicotine patches on the day of surgery.  Do not use any "recreational" drugs for at least a week (preferably 2 weeks) before your surgery.  Please be advised that the combination of cocaine and anesthesia may have negative outcomes, up to and including death. If you test positive for cocaine, your surgery will be cancelled.  On the morning of surgery brush your teeth with toothpaste and water, you may rinse your mouth with mouthwash if you wish. Do not swallow any toothpaste or mouthwash.  Use CHG Soap or wipes as directed on instruction sheet.  Do not wear jewelry, make-up, hairpins, clips or nail polish.  Do not wear lotions, powders, or perfumes.   Do not shave body hair from the neck down 48 hours before surgery.  Contact lenses, hearing aids and dentures may not be worn into surgery.  Do not bring valuables to the hospital. [facility] is not responsible for any missing/lost belongings or valuables.   Total Shoulder Arthroplasty:  use Benzoyl Peroxide 5% Gel as directed on instruction sheet.  Notify your doctor if there is any change in your medical condition (cold, fever, infection).  Wear comfortable clothing (specific to your surgery type) to the hospital.  After surgery, you can help prevent lung complications by doing breathing exercises.  Take deep breaths and cough every 1-2 hours. Your doctor may order a device called an Incentive Spirometer to help you  take deep breaths. When coughing or sneezing, hold a pillow firmly against your incision with both hands. This is called "splinting." Doing this helps protect your incision. It also decreases belly discomfort.  If you are being admitted to the hospital overnight, leave your suitcase in the car. After surgery it may be brought to your room.  In case of increased patient census, it may be  necessary for you, the patient, to continue your postoperative care in the Same Day Surgery department.  If you are being discharged the day of surgery, you will not be allowed to drive home. You will need a responsible individual to drive you home and stay with you for 24 hours after surgery.   If you are taking public transportation, you will need to have a responsible individual with you.  Please call the Pre-admissions Testing Dept. at [phone] if you have any questions about these instructions.  Surgery Visitation Policy:  Patients having surgery or a procedure may have two visitors.  Children under the age of 68 must have an adult with them who is not the patient.  Inpatient Visitation:    Visiting hours are 7 a.m. to 8 p.m. Up to four visitors are allowed at one time in a patient room. The visitors may rotate out with other people during the day.  One visitor age 17 or older may stay with the patient overnight and must be in the room by 8 p.m.   Pre-operative 5 CHG Bath Instructions   You can play a key role in reducing the risk of infection after surgery. Your skin needs to be as free of germs as possible. You can reduce the number of germs on your skin by washing with CHG (chlorhexidine gluconate) soap before surgery. CHG is an antiseptic soap that kills germs and continues to kill germs even after washing.   DO NOT use if you have an allergy to chlorhexidine/CHG or antibacterial soaps. If your skin becomes reddened or irritated, stop using the CHG and notify one of our RNs at [phone].   Please shower with the CHG soap starting 4 days before surgery using the following schedule: 03/09/23 - 03/13/23.    Please keep in mind the following:  DO NOT shave, including legs and underarms, starting the day of your first shower.   You may shave your face at any point before/day of surgery.  Place clean sheets on your bed the day you start using CHG soap. Use a clean  washcloth (not used since being washed) for each shower. DO NOT sleep with pets once you start using the CHG.   CHG Shower Instructions:  If you choose to wash your hair and private area, wash first with your normal shampoo/soap.  After you use shampoo/soap, rinse your hair and body thoroughly to remove shampoo/soap residue.  Turn the water OFF and apply about 3 tablespoons (45 ml) of CHG soap to a CLEAN washcloth.  Apply CHG soap ONLY FROM YOUR NECK DOWN TO YOUR TOES (washing for 3-5 minutes)  DO NOT use CHG soap on face, private areas, open wounds, or sores.  Pay special attention to the area where your surgery is being performed.  If you are having back surgery, having someone wash your back for you may be helpful. Wait 2 minutes after CHG soap is applied, then you may rinse off the CHG soap.  Pat dry with a clean towel  Put on clean clothes/pajamas   If you choose to wear lotion,  please use ONLY the CHG-compatible lotions on the back of this paper.     Additional instructions for the day of surgery: DO NOT APPLY any lotions, deodorants, cologne, or perfumes.   Put on clean/comfortable clothes.  Brush your teeth.  Ask your nurse before applying any prescription medications to the skin.      CHG Compatible Lotions   Aveeno Moisturizing lotion  Cetaphil Moisturizing Cream  Cetaphil Moisturizing Lotion  Clairol Herbal Essence Moisturizing Lotion, Dry Skin  Clairol Herbal Essence Moisturizing Lotion, Extra Dry Skin  Clairol Herbal Essence Moisturizing Lotion, Normal Skin  Curel Age Defying Therapeutic Moisturizing Lotion with Alpha Hydroxy  Curel Extreme Care Body Lotion  Curel Soothing Hands Moisturizing Hand Lotion  Curel Therapeutic Moisturizing Cream, Fragrance-Free  Curel Therapeutic Moisturizing Lotion, Fragrance-Free  Curel Therapeutic Moisturizing Lotion, Original Formula  Eucerin Daily Replenishing Lotion  Eucerin Dry Skin Therapy Plus Alpha Hydroxy Crme  Eucerin  Dry Skin Therapy Plus Alpha Hydroxy Lotion  Eucerin Original Crme  Eucerin Original Lotion  Eucerin Plus Crme Eucerin Plus Lotion  Eucerin TriLipid Replenishing Lotion  Keri Anti-Bacterial Hand Lotion  Keri Deep Conditioning Original Lotion Dry Skin Formula Softly Scented  Keri Deep Conditioning Original Lotion, Fragrance Free Sensitive Skin Formula  Keri Lotion Fast Absorbing Fragrance Free Sensitive Skin Formula  Keri Lotion Fast Absorbing Softly Scented Dry Skin Formula  Keri Original Lotion  Keri Skin Renewal Lotion Keri Silky Smooth Lotion  Keri Silky Smooth Sensitive Skin Lotion  Nivea Body Creamy Conditioning Oil  Nivea Body Extra Enriched Lotion  Nivea Body Original Lotion  Nivea Body Sheer Moisturizing Lotion Nivea Crme  Nivea Skin Firming Lotion  NutraDerm 30 Skin Lotion  NutraDerm Skin Lotion  NutraDerm Therapeutic Skin Cream  NutraDerm Therapeutic Skin Lotion  ProShield Protective Hand Cream  Provon moisturizing lotion Preparing for Total Shoulder Arthroplasty  Before surgery, you can play an important role by reducing the number of germs on your skin by using the following products:  Benzoyl Peroxide Gel  o Reduces the number of germs present on the skin  o Applied twice a day to shoulder area starting two days before surgery  Chlorhexidine Gluconate (CHG) Soap  o An antiseptic cleaner that kills germs and bonds with the skin to continue killing germs even after washing  o Used for showering the night before surgery and morning of surgery  BENZOYL PEROXIDE 5% GEL  Please do not use if you have an allergy to benzoyl peroxide. If your skin becomes reddened/irritated stop using the benzoyl peroxide.  Starting two days before surgery, apply as follows:  1. Apply benzoyl peroxide in the morning and at night. Apply after taking a shower. If you are not taking a shower, clean entire shoulder front, back, and side along with the armpit with a clean wet  washcloth.  2. Place a quarter-sized dollop on your shoulder and rub in thoroughly, making sure to cover the front, back, and side of your shoulder, along with the armpit.  2 days before ____ AM ____ PM 1 day before ____ AM ____ PM  3. Do this twice a day for two days. (Last application is the night before surgery, AFTER using the CHG soap).  4. Do NOT apply benzoyl peroxide gel on the day of surgery.  How to Use an Incentive Spirometer  An incentive spirometer is a tool that measures how well you are filling your lungs with each breath. Learning to take long, deep breaths using this tool can help you  keep your lungs clear and active. This may help to reverse or lessen your chance of developing breathing (pulmonary) problems, especially infection. You may be asked to use a spirometer: After a surgery. If you have a lung problem or a history of smoking. After a long period of time when you have been unable to move or be active. If the spirometer includes an indicator to show the highest number that you have reached, your health care provider or respiratory therapist will help you set a goal. Keep a log of your progress as told by your health care provider. What are the risks? Breathing too quickly may cause dizziness or cause you to pass out. Take your time so you do not get dizzy or light-headed. If you are in pain, you may need to take pain medicine before doing incentive spirometry. It is harder to take a deep breath if you are having pain. How to use your incentive spirometer  Sit up on the edge of your bed or on a chair. Hold the incentive spirometer so that it is in an upright position. Before you use the spirometer, breathe out normally. Place the mouthpiece in your mouth. Make sure your lips are closed tightly around it. Breathe in slowly and as deeply as you can through your mouth, causing the piston or the ball to rise toward the top of the chamber. Hold your breath for 3-5  seconds, or for as long as possible. If the spirometer includes a coach indicator, use this to guide you in breathing. Slow down your breathing if the indicator goes above the marked areas. Remove the mouthpiece from your mouth and breathe out normally. The piston or ball will return to the bottom of the chamber. Rest for a few seconds, then repeat the steps 10 or more times. Take your time and take a few normal breaths between deep breaths so that you do not get dizzy or light-headed. Do this every 1-2 hours when you are awake. If the spirometer includes a goal marker to show the highest number you have reached (best effort), use this as a goal to work toward during each repetition. After each set of 10 deep breaths, cough a few times. This will help to make sure that your lungs are clear. If you have an incision on your chest or abdomen from surgery, place a pillow or a rolled-up towel firmly against the incision when you cough. This can help to reduce pain while taking deep breaths and coughing. General tips When you are able to get out of bed: Walk around often. Continue to take deep breaths and cough in order to clear your lungs. Keep using the incentive spirometer until your health care provider says it is okay to stop using it. If you have been in the hospital, you may be told to keep using the spirometer at home. Contact a health care provider if: You are having difficulty using the spirometer. You have trouble using the spirometer as often as instructed. Your pain medicine is not giving enough relief for you to use the spirometer as told. You have a fever. Get help right away if: You develop shortness of breath. You develop a cough with bloody mucus from the lungs. You have fluid or blood coming from an incision site after you cough. Summary An incentive spirometer is a tool that can help you learn to take long, deep breaths to keep your lungs clear and active. You may be asked to  use a  spirometer after a surgery, if you have a lung problem or a history of smoking, or if you have been inactive for a long period of time. Use your incentive spirometer as instructed every 1-2 hours while you are awake. If you have an incision on your chest or abdomen, place a pillow or a rolled-up towel firmly against your incision when you cough. This will help to reduce pain. Get help right away if you have shortness of breath, you cough up bloody mucus, or blood comes from your incision when you cough. This information is not intended to replace advice given to you by your health care provider. Make sure you discuss any questions you have with your health care provider. Document Revised: 11/03/2019 Document Reviewed: 11/03/2019 Elsevier Patient Education  2023 [employer].  POLAR CARE INFORMATION  MassAdvertisement.it  How to use [facility] Therapy System?  YouTube   ShippingScam.co.uk  OPERATING INSTRUCTIONS  Start the product With dry hands, connect the transformer to the electrical connection located on the top of the cooler. Next, plug the transformer into an appropriate electrical outlet. The unit will automatically start running at this point.  To stop the pump, disconnect electrical power.  Unplug to stop the product when not in use. Unplugging the Polar Care unit turns it off. Always unplug immediately after use. Never leave it plugged in while unattended. Remove pad.    FIRST ADD WATER TO FILL LINE, THEN ICE---Replace ice when existing ice is almost melted  1 Discuss Treatment with your Licensed Health Care Practitioner and Use Only as Prescribed 2 Apply Insulation Barrier & Cold Therapy Pad 3 Check for Moisture 4 Inspect Skin Regularly  Tips and Trouble Shooting Usage Tips 1. Use cubed or chunked ice for optimal performance. 2. It is recommended to drain the Pad between uses. To drain the pad, hold the Pad upright with the hose  pointed toward the ground. Depress the black plunger and allow water to drain out. 3. You may disconnect the Pad from the unit without removing the pad from the affected area by depressing the silver tabs on the hose coupling and gently pulling the hoses apart. The Pad and unit will seal itself and will not leak. Note: Some dripping during release is normal. 4. DO NOT RUN PUMP WITHOUT WATER! The pump in this unit is designed to run with water. Running the unit without water will cause permanent damage to the pump. 5. Unplug unit before removing lid.  TROUBLESHOOTING GUIDE Pump not running, Water not flowing to the pad, Pad is not getting cold 1. Make sure the transformer is plugged into the wall outlet. 2. Confirm that the ice and water are filled to the indicated levels. 3. Make sure there are no kinks in the pad. 4. Gently pull on the blue tube to make sure the tube/pad junction is straight. 5. Remove the pad from the treatment site and ll it while the pad is lying at; then reapply. 6. Confirm that the pad couplings are securely attached to the unit. Listen for the double clicks (Figure 1) to confirm the pad couplings are securely attached.  Leaks    Note: Some condensation on the lines, controller, and pads is unavoidable, especially in warmer climates. 1. If using a [employer] Polar Care Cold Therapy unit with a detachable Cold Therapy Pad, and a leak exists (other than condensation on the lines) disconnect the pad couplings. Make sure the silver tabs on the couplings are depressed before  reconnecting the pad to the pump hose; then confirm both sides of the coupling are properly clicked in. 2. If the coupling continues to leak or a leak is detected in the pad itself, stop using it and call [employer] Customer Care at [phone].  Cleaning After use, empty and dry the unit with a soft cloth. Warm water and mild detergent may be used occasionally to clean the pump and tubes.  WARNING: The Polar Care  Cube can be cold enough to cause serious injury, including full skin necrosis. Follow these Operating Instructions, and carefully read the Product Insert (see pouch on side of unit) and the Cold Therapy Pad Fitting Instructions (provided with each Cold Therapy Pad) prior to use.

## 2023-03-07 ENCOUNTER — Encounter: Payer: Self-pay | Admitting: Surgery

## 2023-03-07 NOTE — Progress Notes (Signed)
Perioperative / Anesthesia Services  Pre-Admission Testing Clinical Review / Preoperative Anesthesia Consult  Date: 03/07/23  Patient Demographics:  Name: Kristi Greer DOB:   12/24/1950 MRN:   [id number]  Planned Surgical Procedure(s):    Case: [id number] Date/Time: 03/13/23 1045   Procedure: REVERSE SHOULDER ARTHROPLASTY (Left: Shoulder)   Anesthesia type: Choice   Pre-op diagnosis:      Nontraumatic complete tear of left rotator cuff M75.122     Left shoulder pain, unspecified chronicity M25.512     Rotator cuff tendinitis, left M75.82   Location: [facility] OR ROOM 03 / [facility]   Surgeons: Christena Flake, MD     NOTE: Available PAT nursing documentation and vital signs have been reviewed. Clinical nursing staff has updated patient's PMH/PSHx, current medication list, and drug allergies/intolerances to ensure comprehensive history available to assist in medical decision making as it pertains to the aforementioned surgical procedure and anticipated anesthetic course. Extensive review of available clinical information personally performed. [place] PMH and PSHx updated with any diagnoses/procedures that  may have been inadvertently omitted during her intake with the pre-admission testing department's nursing staff.  Clinical Discussion:  Kristi Greer is a 72 y.o. female who is submitted for pre-surgical anesthesia review and clearance prior to her undergoing the above procedure. Patient has never been a smoker. Pertinent PMH includes: atrial fibrillation, NICM, CHF, SVT/long RP tachycardia, NSVT,  HTN, HLD, hypothyroidism, DOE, GERD (uses CaCo3 tablets PRN), hiatal hernia, anemia, LEFT rotator cuff tear, LEFT rotator cuff tendinitis, chronic back pain.   Patient is followed by cardiology Mariah Milling, MD). She was last seen in the cardiology clinic on 06/16/2022; notes reviewed. At the time of her clinic visit, patient doing well overall from a cardiovascular  perspective.  Patient complained of mild exertional dyspnea, however noted that it was stable and at baseline.  Patient denied any chest pain, PND, orthopnea, palpitations, significant peripheral edema, weakness, fatigue, vertiginous symptoms, or presyncope/syncope. Patient with a past medical history significant for cardiovascular diagnoses. Documented physical exam was grossly benign, providing no evidence of acute exacerbation and/or decompensation of the patient's known cardiovascular conditions.  Patient diagnosed with a nonischemic cardiomyopathy and January 2017.  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  TTE at that time revealed a moderately reduced left ventricular systolic function with an EF of 35-40%.  There were no regional wall motion abnormalities.  There was moderate mitral and mild tricuspid valve regurgitation.  There was no evidence of a significant transvalvular gradient to suggest stenosis.   Patient admitted here at [facility] in January 2017 with acute CHF and SVT.  Patient was given Adenocard, however chemical cardioversion was unsuccessful.  Patient ultimately spontaneously converted without further intervention.     Patient underwent diagnostic RIGHT/LEFT heart catheterization on 09/28/2015.  Study revealed normal coronary anatomy with no evidence of significant obstructive coronary artery disease.  Global hypokinesis was noted.  Patient with well compensated hemodynamics; mean PA = 16 mmHg, PCWP 7 mmHg, PVR 2.0 WU, CO = 4.7 L/min, CI 2.8 L/min/m.   Patient with an episode of documented NSVT captured during admission for acute illness on 11/16/2015. She was admitted to [facility] for septic shock due to concurrent urinary tract infection, influenza A, and viral gastroenteritis. NSVT episode was felt to be related to her NICM in the setting of increased physiological stress from septic shock  secondary and viral illness. Patient was received  4 liters of IV saline for SBPs in the 60s, which only improved SBP to the low 80s. vasopressors started and patient was admitted to the ICU; admission was from 11/14/2015 - 11/18/2015 .   Cardiac MRI was performed on 04/27/2016 revealing a normal left ventricular size and mildly decreased systolic function with an EF of 48%.  There was mild diffuse hypokinesis.  No evidence of LGE to suggest an ischemic, infiltrative, or inflammatory cardiomyopathy.  There was trivial mitral and tricuspid valve regurgitation.  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   Most recent TTE was performed on 03/25/2018 revealing a low normal left ventricular systolic function with an EF of 50-55%.  There were no regional wall motion abnormalities. Diastolic Doppler parameters consistent with pseudonormalization (G2DD).  There was trivial pulmonary valve regurgitation.  Study revealed normal transvalvular gradients suggesting no evidence of valvular stenosis.  Blood pressure well controlled at 112/68 mmHg on currently prescribed beta-blocker (carvedilol), and diuretic (furosemide) therapies.  Patient is on rosuvastatin for her HLD diagnosis and ASCVD prevention. She is not diabetic.  Patient does not have an OSAH diagnosis.  Patient reported to be more sedentary as of late.  She had stopped going to the gym.  She reported that she felt deconditioned.  With that said, patient still participating in pleasure activities such as playing golf.  She is able to complete all of her ADL/IADLs independently without cardiovascular limitation. per the DASI, patient maintains adequate functional capacity and is able to achieve at least 4 METS of activity without experiencing any type of angina/anginal equivalent symptoms.  No changes were made to her medication regimen.  Patient to follow-up with outpatient cardiology in 1 year or sooner if needed.   Kristi Greer is scheduled for an elective REVERSE SHOULDER ARTHROPLASTY (Left: Shoulder) on 03/13/2023 with Dr. Leron Croak, MD.  Given patient's past medical history significant for cardiovascular diagnoses, presurgical cardiac clearance was sought by the PAT team. Per cardiology, "according to the RCRI, patient has a 0.9% risk of MACE. Patient reports activity equivalent to >4.0 METS (yard work, light to moderate household chores). Given past medical history and time since last visit, based on ACC/AHA guidelines, Kristi Greer would be at ACCEPTABLE risk for the planned procedure without further cardiovascular testing".  In review of her medication reconciliation, the patient is not noted to be taking any type of anticoagulation or antiplatelet therapies that would need to be held during her perioperative course.  Patient reports previous perioperative complications with anesthesia in the past. Patient has a PMH (+) for PONV. Symptoms and history of PONV will be discussed with patient by anesthesia team on the day of her procedure. Interventions will be ordered as deemed necessary based on patient's individual care needs as determined by anesthesiologist. In review of the available records, it is noted that patient underwent a general anesthetic course at [facility] ([facility]) in 05/2022 without documented complications.      03/06/2023    2:09 PM 02/12/2023    1:30 PM 11/20/2022    2:43 PM  Vitals with BMI  Height 5\' 1"  5\' 1"  5\' 1"   Weight 166 lbs 11 oz 161 lbs 157 lbs  BMI 31.51 30.44 29.68  Systolic  124   Diastolic  70   Pulse  60     Providers/Specialists:   NOTE: Primary physician provider listed below. Patient may have been seen by APP or partner within same practice.   PROVIDER ROLE / SPECIALTY  LAST OV  Poggi, Excell Seltzer, MD Orthopedics (Surgeon) 02/23/2023  Dale Mammoth, MD Primary Care Provider 10/13/2022  Julien Nordmann, MD Cardiology 06/16/2022; update preop APP call  on 03/05/2023   Allergies:  Patient has no known allergies.  Current Home Medications:   No current facility-administered medications for this encounter.    acetaminophen (TYLENOL) 500 MG tablet   acetaminophen (TYLENOL) 650 MG CR tablet   Calcium Carb-Cholecalciferol (CALCIUM-VITAMIN D3 PO)   calcium carbonate (TUMS - DOSED IN MG ELEMENTAL CALCIUM) 500 MG chewable tablet   carvedilol (COREG) 12.5 MG tablet   Cholecalciferol (D3-1000) 25 MCG (1000 UT) capsule   docusate sodium (COLACE) 100 MG capsule   estradiol (ESTRACE VAGINAL) 0.1 MG/GM vaginal cream   fexofenadine (ALLEGRA) 180 MG tablet   furosemide (LASIX) 20 MG tablet   meloxicam (MOBIC) 15 MG tablet   methimazole (TAPAZOLE) 5 MG tablet   rosuvastatin (CRESTOR) 10 MG tablet   SUMAtriptan (IMITREX) 100 MG tablet   triamcinolone (NASACORT) 55 MCG/ACT AERO nasal inhaler   zinc gluconate 50 MG tablet   zonisamide (ZONEGRAN) 100 MG capsule   History:   Past Medical History:  Diagnosis Date   Allergy    Anemia    Arthritis    Atrial fibrillation (HCC)    Cardiomyopathy, nonischemic (HCC)    a.) felt to be tachycardia mediated NICM; b.) TTE 09/01/2015: EF 35-40%; c.) [facility] 11/26/2015: ED 30-35%, norm cors, mPA 16, PCWP 7, CO 4.7, CI 2.8; d.) TTE 01/31/2016: EF 40-45%; e.) cMRI 04/27/2016: EF 48%; f.) TTE 12/06/2016: EF 55-60%; g.) TTE 03/25/2018: EF 50-55%   Carpal tunnel syndrome, bilateral    Chronic back pain    Congestive heart failure (HCC)    a.) TTE 09/01/2015: EF 35-40%, mod MR; b.) R/LHC 11/26/2015: EF 30-35%; c.) TTE 01/31/2016: EF 40-45%, mild LVH, diffuse HK, triv MR/TR; d.) TTE 12/06/2016: EF 55-60%, mild MAC, G1DD; e.) TTE 03/25/2018: EF 50-55%, triv PR, G2DD.   Diverticulosis    DOE (dyspnea on exertion)    Dyspnea on exertion    Dysrhythmia    Environmental and seasonal allergies    Female cystocele    GERD (gastroesophageal reflux disease)    Hiatal hernia    History of complications due to general  anesthesia    a.) PONV; b.) delayed emergence   Hyperlipidemia    Hypertension    Hyperthyroidism    Migraines    Nontraumatic complete tear of left rotator cuff    NSVT (nonsustained ventricular tachycardia) (HCC) 11/16/2015   a.) felt to be related to NICM in the setting of increased stressors, influenza A, hypotension Tx'd  with IVF bolus   Overactive detrusor    Personal history of colonic polyps    PONV (postoperative nausea and vomiting)    Septic shock (HCC) 11/14/2015   a.) admitted 11/14/2015 - 11/18/2015 for urosepsis + influenza + viral gastroenteritis (+ N/V/D). SBP unpon arrival was in the 60s --> improved to 80s after 4L IVFs --> started on pressors and admitted to ICU.   Skin cancer, basal cell    Squamous cell skin cancer    SUI (stress urinary incontinence, female)    SVT (supraventricular tachycardia) 08/31/2015   a.) rates as high as 150 bpm; b.) EP feels as if event was "long RP tachycardia"   Tendinitis of left rotator cuff    Uterovaginal prolapse    Vaginal atrophy    Past Surgical History:  Procedure Laterality Date   ANTERIOR AND POSTERIOR VAGINAL REPAIR  W/ SACROSPINOUS LIGAMENT SUSPENSION  10/18/2012   CARDIAC CATHETERIZATION N/A 11/26/2015   Procedure: Right/Left Heart Cath and Coronary Angiography;  Surgeon: Dolores Patty, MD;  Location: [facility] INVASIVE CV LAB;  Service: Cardiovascular;  Laterality: N/A;   CARPAL TUNNEL RELEASE Right    CATARACT EXTRACTION Bilateral    CHOLECYSTECTOMY  2006   COLONOSCOPY WITH PROPOFOL N/A 03/19/2017   Procedure: COLONOSCOPY WITH PROPOFOL;  Surgeon: Christena Deem, MD;  [facility] ENDOSCOPY;  Service: Endoscopy;  Laterality: N/A;   COLPOPEXY  10/18/2012   CYSTOSCOPY WITH BIOPSY N/A 04/21/2022   Procedure: CYSTOSCOPY WITH BLADDER BIOPSY;  Surgeon: Sondra Come, MD;  Location: [facility] ORS;  Service: Urology;  Laterality: N/A;   CYSTOURETHROSCOPY  10/18/2012   ESOPHAGOGASTRODUODENOSCOPY (EGD) WITH PROPOFOL N/A  10/03/2016   Procedure: ESOPHAGOGASTRODUODENOSCOPY (EGD) WITH PROPOFOL;  Surgeon: Christena Deem, MD;  Location: [facility] ENDOSCOPY;  Service: Endoscopy;  Laterality: N/A;   HYSTEROSCOPY  2011, 10/2010   OOPHORECTOMY     REVERSE SHOULDER ARTHROPLASTY Right 06/17/2019   Procedure: REVERSE SHOULDER ARTHROPLASTY;  Surgeon: Christena Flake, MD;  Location: [facility] ORS;  Service: Orthopedics;  Laterality: Right;   ROBOTIC ASSISTED LAPAROSCOPIC SACROCOLPOPEXY N/A 06/21/2022   Procedure: XI ROBOTIC ASSISTED LAPAROSCOPIC SACROCOLPOPEXY;  Surgeon: Crist Fat, MD;  Location: [facility] ORS;  Service: Urology;  Laterality: N/A;  240 MINUTES   TONSILLECTOMY     TOTAL SHOULDER REPLACEMENT Right    TRIGGER FINGER RELEASE Bilateral    TUBAL LIGATION     VAGINAL HYSTERECTOMY     midurethral sling   Family History  Problem Relation Age of Onset   Hypertension Mother    Stroke Mother    Diabetes Mother    Heart disease Father    Hypercholesterolemia Father    Hypertension Father    Stroke Father    Heart attack Father    Breast cancer Neg Hx    Colon cancer Neg Hx    Thyroid disease Neg Hx    Social History   Tobacco Use   Smoking status: Never    Passive exposure: Never   Smokeless tobacco: Never  Vaping Use   Vaping Use: Never used  Substance Use Topics   Alcohol use: Not Currently   Drug use: Never    Pertinent Clinical Results:  LABS:   Hospital Outpatient Visit on 03/06/2023  Component Date Value Ref Range Status   Color, Urine 03/06/2023 YELLOW (A)  YELLOW Final   APPearance 03/06/2023 CLOUDY (A)  CLEAR Final   Specific Gravity, Urine 03/06/2023 1.019  1.005 - 1.030 Final   pH 03/06/2023 6.0  5.0 - 8.0 Final   Glucose, UA 03/06/2023 NEGATIVE  NEGATIVE mg/dL Final   Hgb urine dipstick 03/06/2023 NEGATIVE  NEGATIVE Final   Bilirubin Urine 03/06/2023 NEGATIVE  NEGATIVE Final   Ketones, ur 03/06/2023 NEGATIVE  NEGATIVE mg/dL Final   Protein, ur 95/62/1308 NEGATIVE  NEGATIVE mg/dL  Final   Nitrite 65/78/4696 NEGATIVE  NEGATIVE Final   Leukocytes,Ua 03/06/2023 NEGATIVE  NEGATIVE Final   Performed at [facility], [address] [place], [place] [address]   ABO/RH(D) 03/06/2023 O POS   Final   Antibody Screen 03/06/2023 NEG   Final   Sample Expiration 03/06/2023 03/20/2023,2359   Final   Extend sample reason 03/06/2023    Final                   Value:NO TRANSFUSIONS OR PREGNANCY IN THE PAST 3 MONTHS Performed at [facility]  Lab, [address] [place], [place] [address]    MRSA, PCR 03/06/2023 NEGATIVE  NEGATIVE Final   Staphylococcus aureus 03/06/2023 NEGATIVE  NEGATIVE Final   Comment: (NOTE) The Xpert SA Assay (FDA approved for NASAL specimens in patients 97 years of age and older), is one component of a comprehensive surveillance program. It is not intended to diagnose infection nor to guide or monitor treatment. Performed at [facility], [address] [place], [place] [address]   Lab on 03/05/2023  Component Date Value Ref Range Status   WBC 03/05/2023 7.6  4.0 - 10.5 K/uL Final   RBC 03/05/2023 4.63  3.87 - 5.11 Mil/uL Final   Hemoglobin 03/05/2023 13.3  12.0 - 15.0 g/dL Final   HCT 09/81/1914 41.4  36.0 - 46.0 % Final   MCV 03/05/2023 89.5  78.0 - 100.0 fl Final   MCHC 03/05/2023 32.2  30.0 - 36.0 g/dL Final   RDW 78/29/5621 14.2  11.5 - 15.5 % Final   Platelets 03/05/2023 208.0  150.0 - 400.0 K/uL Final   Neutrophils Relative % 03/05/2023 52.9  43.0 - 77.0 % Final   Lymphocytes Relative 03/05/2023 35.2  12.0 - 46.0 % Final   Monocytes Relative 03/05/2023 6.0  3.0 - 12.0 % Final   Eosinophils Relative 03/05/2023 4.8  0.0 - 5.0 % Final   Basophils Relative 03/05/2023 1.1  0.0 - 3.0 % Final   Neutro Abs 03/05/2023 4.0  1.4 - 7.7 K/uL Final   Lymphs Abs 03/05/2023 2.7  0.7 - 4.0 K/uL Final   Monocytes Absolute 03/05/2023 0.5  0.1 - 1.0 K/uL Final   Eosinophils Absolute 03/05/2023 0.4  0.0 - 0.7 K/uL Final   Basophils  Absolute 03/05/2023 0.1  0.0 - 0.1 K/uL Final    ECG: Date: 03/06/2023 Time ECG obtained: 1409 PM Rate: 79 bpm Rhythm: normal sinus Axis (leads I and aVF): Normal Intervals: PR 208 ms. QRS 76 ms. QTc 376 ms. ST segment and T wave changes: No evidence of acute ST segment elevation or depression Comparison: Similar to previous tracing obtained on 06/16/2022   IMAGING / PROCEDURES: MR SHOULDER LEFT WO CONTRAST performed on 01/19/2023 Complete supraspinatus tendon tear with 2-3 cm of retraction. There is no focal atrophy. Mild atrophy of all musculature about the shoulders noted. Complete tear of the long head of biceps from the superior labrum. Acromioclavicular osteoarthritis.  Type 2 acromion with subacromial spurring also noted.  CT HEMATURIA WORKUP performed on 02/10/2022 Cystocele small layering bladder calculi likely responsible for the patient's hematuria. No renal or ureteral calculi. No worrisome renal or bladder lesions. Bilateral parapelvic renal cysts. Status post cholecystectomy. No biliary dilatation. Status post hysterectomy. Both ovaries are still present and appear normal. Fatty replacement of the left gluteus medius and minimus muscles likely related to prior trauma or denervation process.   TRANSTHORACIC ECHOCARDIOGRAM performed on 03/25/2018 Low normal left ventricular systolic function with an EF of 50-55% No regional wall motion abnormalities Diastolic Doppler parameters consistent with pseudonormalization (G2DD). Trivial PR No AR, MR, TR Normal gradients; no valvular stenosis No pericardial effusion   MRI CARD MORPHOLOGY WO/W CM performed on 04/27/2016 Normal left ventricular size and thickness and mildly decreased systolic function (LVEF = 48%) with mild diffuse hypokinesis. There is no late gadolinium enhancement. Normal right ventricular size, thickness and systolic function (RVEF = 62%) with no regional wall motion abnormalities. Trivial mitral and  tricuspid regurgitation. There is no evidence for ischemic, infiltrative or inflammatory cardiomyopathy. Collectively, this is  consistent with non-ischemic cardiomyopathy possibly tachycardia induced.   RIGHT/LEFT HEART CATHETERIZATION AND CORONARY ANGIOGRAPHY performed on 11/26/2015 Moderate left ventricular systolic dysfunction with an EF of 30-35%; suspect tachycardia induced Global hypokinesis Normal coronary anatomy with no evidence of obstructive CAD Well compensated hemodynamics Ao =  104/61 (78) LV = 107/6/14 RA = 4 RV = 28/4/8 PA = 25/8 (16) PCW = 7 Fick cardiac output/index = 4.7/2.8 PVR = 2.0 WU SVR = 1265 FA sat = 99% PA sat = 71%, 74%   Impression and Plan:  Kristi Greer has been referred for pre-anesthesia review and clearance prior to her undergoing the planned anesthetic and procedural courses. Available labs, pertinent testing, and imaging results were personally reviewed by me in preparation for upcoming operative/procedural course. [facility] medical record has been updated following extensive record review and patient interview with PAT staff.   This patient has been appropriately cleared by cardiology with an overall ACCEPTABLE risk of experiencing significant perioperative cardiovascular complications. Based on clinical review performed today (03/07/23), barring any significant acute changes in the patient's overall condition, it is anticipated that she will be able to proceed with the planned surgical intervention. Any acute changes in clinical condition may necessitate her procedure being postponed and/or cancelled. Patient will meet with anesthesia team (MD and/or CRNA) on the day of her procedure for preoperative evaluation/assessment. Questions regarding anesthetic course will be fielded at that time.   Pre-surgical instructions were reviewed with the patient during her PAT appointment, and questions were fielded to satisfaction by PAT clinical staff. She  has been instructed on which medications that she will need to hold prior to surgery, as well as the ones that have been deemed safe/appropriate to take on the day of her procedure. As part of the general education provided by PAT, patient made aware both verbally and in writing, that she would need to abstain from the use of any illegal substances during her perioperative course.  She was advised that failure to follow the provided instructions could necessitate case cancellation or result in serious perioperative complications up to and including death. Patient encouraged to contact PAT and/or her surgeon's office to discuss any questions or concerns that may arise prior to surgery; verbalized understanding.   Quentin Mulling, MSN, APRN, FNP-C, CEN [facility]  Peri-operative Services Nurse Practitioner Phone: [phone] Fax: [phone] 03/07/23 11:47 AM  NOTE: This note has been prepared using Dragon dictation software. Despite my best ability to proofread, there is always the potential that unintentional transcriptional errors may still occur from this process.

## 2023-03-13 ENCOUNTER — Ambulatory Visit: Payer: Medicare Other

## 2023-03-13 ENCOUNTER — Encounter: Payer: Self-pay | Admitting: Surgery

## 2023-03-13 ENCOUNTER — Other Ambulatory Visit: Payer: Self-pay

## 2023-03-13 ENCOUNTER — Ambulatory Visit: Payer: Medicare Other | Admitting: Urgent Care

## 2023-03-13 ENCOUNTER — Encounter
Admission: RE | Disposition: A | Discharge status: Home / Self Care | Payer: Self-pay | Source: Home / Self Care | Attending: Surgery

## 2023-03-13 ENCOUNTER — Ambulatory Visit
Admission: RE | Admit: 2023-03-13 | Discharge: 2023-03-13 | Disposition: A | Discharge status: Home / Self Care | Payer: Medicare Other | Attending: Surgery | Admitting: Surgery

## 2023-03-13 ENCOUNTER — Telehealth: Payer: Self-pay | Admitting: Cardiovascular Disease

## 2023-03-13 DIAGNOSIS — M549 Dorsalgia, unspecified: Secondary | ICD-10-CM | POA: Diagnosis not present

## 2023-03-13 DIAGNOSIS — I428 Other cardiomyopathies: Secondary | ICD-10-CM | POA: Diagnosis not present

## 2023-03-13 DIAGNOSIS — S46112A Strain of muscle, fascia and tendon of long head of biceps, left arm, initial encounter: Secondary | ICD-10-CM | POA: Diagnosis not present

## 2023-03-13 DIAGNOSIS — G8929 Other chronic pain: Secondary | ICD-10-CM | POA: Diagnosis not present

## 2023-03-13 DIAGNOSIS — M75122 Complete rotator cuff tear or rupture of left shoulder, not specified as traumatic: Secondary | ICD-10-CM | POA: Insufficient documentation

## 2023-03-13 DIAGNOSIS — K219 Gastro-esophageal reflux disease without esophagitis: Secondary | ICD-10-CM | POA: Diagnosis not present

## 2023-03-13 DIAGNOSIS — I4891 Unspecified atrial fibrillation: Secondary | ICD-10-CM | POA: Diagnosis not present

## 2023-03-13 DIAGNOSIS — M7542 Impingement syndrome of left shoulder: Secondary | ICD-10-CM | POA: Insufficient documentation

## 2023-03-13 DIAGNOSIS — M12812 Other specific arthropathies, not elsewhere classified, left shoulder: Secondary | ICD-10-CM | POA: Diagnosis not present

## 2023-03-13 DIAGNOSIS — M25812 Other specified joint disorders, left shoulder: Secondary | ICD-10-CM | POA: Insufficient documentation

## 2023-03-13 DIAGNOSIS — M7582 Other shoulder lesions, left shoulder: Secondary | ICD-10-CM | POA: Diagnosis not present

## 2023-03-13 DIAGNOSIS — K449 Diaphragmatic hernia without obstruction or gangrene: Secondary | ICD-10-CM | POA: Diagnosis not present

## 2023-03-13 DIAGNOSIS — M1990 Unspecified osteoarthritis, unspecified site: Secondary | ICD-10-CM | POA: Diagnosis not present

## 2023-03-13 DIAGNOSIS — Z96612 Presence of left artificial shoulder joint: Secondary | ICD-10-CM | POA: Diagnosis not present

## 2023-03-13 DIAGNOSIS — G8918 Other acute postprocedural pain: Secondary | ICD-10-CM | POA: Diagnosis not present

## 2023-03-13 DIAGNOSIS — I110 Hypertensive heart disease with heart failure: Secondary | ICD-10-CM | POA: Diagnosis not present

## 2023-03-13 DIAGNOSIS — E669 Obesity, unspecified: Secondary | ICD-10-CM | POA: Diagnosis not present

## 2023-03-13 DIAGNOSIS — E785 Hyperlipidemia, unspecified: Secondary | ICD-10-CM | POA: Diagnosis not present

## 2023-03-13 DIAGNOSIS — Z6830 Body mass index (BMI) 30.0-30.9, adult: Secondary | ICD-10-CM | POA: Insufficient documentation

## 2023-03-13 DIAGNOSIS — I509 Heart failure, unspecified: Secondary | ICD-10-CM | POA: Diagnosis not present

## 2023-03-13 DIAGNOSIS — M19012 Primary osteoarthritis, left shoulder: Secondary | ICD-10-CM | POA: Diagnosis not present

## 2023-03-13 DIAGNOSIS — R0609 Other forms of dyspnea: Secondary | ICD-10-CM | POA: Diagnosis not present

## 2023-03-13 DIAGNOSIS — E0590 Thyrotoxicosis, unspecified without thyrotoxic crisis or storm: Secondary | ICD-10-CM | POA: Diagnosis not present

## 2023-03-13 DIAGNOSIS — Z471 Aftercare following joint replacement surgery: Secondary | ICD-10-CM | POA: Diagnosis not present

## 2023-03-13 DIAGNOSIS — M7522 Bicipital tendinitis, left shoulder: Secondary | ICD-10-CM | POA: Diagnosis not present

## 2023-03-13 HISTORY — DX: Complete rotator cuff tear or rupture of left shoulder, not specified as traumatic: M75.122

## 2023-03-13 HISTORY — DX: Other forms of dyspnea: R06.09

## 2023-03-13 HISTORY — DX: Other shoulder lesions, left shoulder: M75.82

## 2023-03-13 HISTORY — PX: REVERSE SHOULDER ARTHROPLASTY: SHX5054

## 2023-03-13 SURGERY — ARTHROPLASTY, SHOULDER, TOTAL, REVERSE
Anesthesia: General | Site: Shoulder | Laterality: Left

## 2023-03-13 MED ORDER — TRANEXAMIC ACID 1000 MG/10ML IV SOLN
INTRAVENOUS | Status: AC
  Filled 2023-03-13: qty 10

## 2023-03-13 MED ORDER — DEXAMETHASONE SODIUM PHOSPHATE 10 MG/ML IJ SOLN
INTRAMUSCULAR | Status: DC | PRN
  Administered 2023-03-13: 10 mg via INTRAVENOUS

## 2023-03-13 MED ORDER — FENTANYL CITRATE (PF) 100 MCG/2ML IJ SOLN
INTRAMUSCULAR | Status: AC
  Filled 2023-03-13: qty 2

## 2023-03-13 MED ORDER — LACTATED RINGERS IV SOLN: INTRAVENOUS | Status: DC

## 2023-03-13 MED ORDER — ONDANSETRON HCL 4 MG/2ML IJ SOLN
INTRAMUSCULAR | Status: AC
  Filled 2023-03-13: qty 14

## 2023-03-13 MED ORDER — OXYCODONE HCL 5 MG PO TABS: 5.0000 mg | ORAL_TABLET | ORAL | 0 refills | Status: DC | PRN

## 2023-03-13 MED ORDER — SUCCINYLCHOLINE CHLORIDE 200 MG/10ML IV SOSY
PREFILLED_SYRINGE | INTRAVENOUS | Status: AC
  Filled 2023-03-13: qty 20

## 2023-03-13 MED ORDER — KETOROLAC TROMETHAMINE 15 MG/ML IJ SOLN
INTRAMUSCULAR | Status: AC
  Filled 2023-03-13: qty 1

## 2023-03-13 MED ORDER — DROPERIDOL 2.5 MG/ML IJ SOLN: 0.6250 mg | Freq: Once | INTRAMUSCULAR | Status: DC | PRN

## 2023-03-13 MED ORDER — CEFAZOLIN SODIUM-DEXTROSE 2-4 GM/100ML-% IV SOLN
2.0000 g | INTRAVENOUS | Status: AC
  Administered 2023-03-13: 2 g via INTRAVENOUS

## 2023-03-13 MED ORDER — SODIUM CHLORIDE 0.9 % IV SOLN: INTRAVENOUS | Status: DC

## 2023-03-13 MED ORDER — BUPIVACAINE HCL (PF) 0.5 % IJ SOLN
INTRAMUSCULAR | Status: DC | PRN
  Administered 2023-03-13: 10 mL via PERINEURAL

## 2023-03-13 MED ORDER — ONDANSETRON HCL 4 MG/2ML IJ SOLN: 4.0000 mg | Freq: Four times a day (QID) | INTRAMUSCULAR | Status: DC | PRN

## 2023-03-13 MED ORDER — ACETAMINOPHEN 10 MG/ML IV SOLN: 1000.0000 mg | Freq: Once | INTRAVENOUS | Status: DC | PRN

## 2023-03-13 MED ORDER — CHLORHEXIDINE GLUCONATE 0.12 % MT SOLN
15.0000 mL | Freq: Once | OROMUCOSAL | Status: AC
  Administered 2023-03-13: 15 mL via OROMUCOSAL

## 2023-03-13 MED ORDER — OXYCODONE HCL 5 MG PO TABS: 5.0000 mg | ORAL_TABLET | ORAL | Status: DC | PRN

## 2023-03-13 MED ORDER — MIDAZOLAM HCL 2 MG/2ML IJ SOLN
INTRAMUSCULAR | Status: DC | PRN
  Administered 2023-03-13: 2 mg via INTRAVENOUS

## 2023-03-13 MED ORDER — LACTATED RINGERS IV SOLN: INTRAVENOUS | Status: DC | PRN

## 2023-03-13 MED ORDER — ROCURONIUM BROMIDE 100 MG/10ML IV SOLN
INTRAVENOUS | Status: DC | PRN
  Administered 2023-03-13: 40 mg via INTRAVENOUS

## 2023-03-13 MED ORDER — ORAL CARE MOUTH RINSE: 15.0000 mL | Freq: Once | OROMUCOSAL | Status: AC

## 2023-03-13 MED ORDER — TRANEXAMIC ACID 1000 MG/10ML IV SOLN
INTRAVENOUS | Status: DC | PRN
  Administered 2023-03-13: 1000 mg via TOPICAL

## 2023-03-13 MED ORDER — PHENYLEPHRINE 80 MCG/ML (10ML) SYRINGE FOR IV PUSH (FOR BLOOD PRESSURE SUPPORT)
PREFILLED_SYRINGE | INTRAVENOUS | Status: AC
  Filled 2023-03-13: qty 30

## 2023-03-13 MED ORDER — CEFAZOLIN SODIUM-DEXTROSE 2-4 GM/100ML-% IV SOLN
INTRAVENOUS | Status: AC
  Filled 2023-03-13: qty 100

## 2023-03-13 MED ORDER — PROPOFOL 10 MG/ML IV BOLUS
INTRAVENOUS | Status: AC
  Filled 2023-03-13: qty 40

## 2023-03-13 MED ORDER — FENTANYL CITRATE (PF) 100 MCG/2ML IJ SOLN: 25.0000 ug | INTRAMUSCULAR | Status: DC | PRN

## 2023-03-13 MED ORDER — ACETAMINOPHEN 10 MG/ML IV SOLN
INTRAVENOUS | Status: DC | PRN
  Administered 2023-03-13: 1000 mg via INTRAVENOUS

## 2023-03-13 MED ORDER — KETOROLAC TROMETHAMINE 15 MG/ML IJ SOLN
15.0000 mg | Freq: Once | INTRAMUSCULAR | Status: AC
  Administered 2023-03-13: 15 mg via INTRAVENOUS

## 2023-03-13 MED ORDER — SODIUM CHLORIDE 0.9 % IR SOLN
Status: DC | PRN
  Administered 2023-03-13: 3000 mL

## 2023-03-13 MED ORDER — FUROSEMIDE 20 MG PO TABS: 20.0000 mg | ORAL_TABLET | Freq: Every day | ORAL | Status: AC | PRN

## 2023-03-13 MED ORDER — BUPIVACAINE LIPOSOME 1.3 % IJ SUSP
INTRAMUSCULAR | Status: DC | PRN
  Administered 2023-03-13: 20 mL via PERINEURAL

## 2023-03-13 MED ORDER — DOCUSATE SODIUM 100 MG PO CAPS: 100.0000 mg | ORAL_CAPSULE | Freq: Every day | ORAL | Status: AC | PRN

## 2023-03-13 MED ORDER — BUPIVACAINE-EPINEPHRINE (PF) 0.5% -1:200000 IJ SOLN
INTRAMUSCULAR | Status: AC
  Filled 2023-03-13: qty 20

## 2023-03-13 MED ORDER — ACETAMINOPHEN 10 MG/ML IV SOLN
INTRAVENOUS | Status: AC
  Filled 2023-03-13: qty 100

## 2023-03-13 MED ORDER — SUGAMMADEX SODIUM 200 MG/2ML IV SOLN
INTRAVENOUS | Status: DC | PRN
  Administered 2023-03-13: 200 mg via INTRAVENOUS

## 2023-03-13 MED ORDER — PHENYLEPHRINE HCL (PRESSORS) 10 MG/ML IV SOLN
INTRAVENOUS | Status: DC | PRN
  Administered 2023-03-13: 80 ug via INTRAVENOUS

## 2023-03-13 MED ORDER — PHENYLEPHRINE HCL (PRESSORS) 10 MG/ML IV SOLN
INTRAVENOUS | Status: AC
  Filled 2023-03-13: qty 1

## 2023-03-13 MED ORDER — OXYCODONE HCL 5 MG PO TABS: 5.0000 mg | ORAL_TABLET | Freq: Once | ORAL | Status: DC | PRN

## 2023-03-13 MED ORDER — NOREPINEPHRINE 4 MG/250ML-% IV SOLN
INTRAVENOUS | Status: DC | PRN
  Administered 2023-03-13: 2 ug/min via INTRAVENOUS

## 2023-03-13 MED ORDER — EPHEDRINE 5 MG/ML INJ
INTRAVENOUS | Status: AC
  Filled 2023-03-13: qty 10

## 2023-03-13 MED ORDER — LIDOCAINE HCL (CARDIAC) PF 100 MG/5ML IV SOSY
PREFILLED_SYRINGE | INTRAVENOUS | Status: DC | PRN
  Administered 2023-03-13: 80 mg via INTRAVENOUS

## 2023-03-13 MED ORDER — METOCLOPRAMIDE HCL 5 MG/ML IJ SOLN: 5.0000 mg | Freq: Three times a day (TID) | INTRAMUSCULAR | Status: DC | PRN

## 2023-03-13 MED ORDER — BUPIVACAINE-EPINEPHRINE (PF) 0.5% -1:200000 IJ SOLN
INTRAMUSCULAR | Status: DC | PRN
  Administered 2023-03-13: 30 mL

## 2023-03-13 MED ORDER — DEXAMETHASONE SODIUM PHOSPHATE 10 MG/ML IJ SOLN
INTRAMUSCULAR | Status: AC
  Filled 2023-03-13: qty 3

## 2023-03-13 MED ORDER — OXYCODONE HCL 5 MG/5ML PO SOLN: 5.0000 mg | Freq: Once | ORAL | Status: DC | PRN

## 2023-03-13 MED ORDER — ONDANSETRON 4 MG PO TBDP: 4.0000 mg | ORAL_TABLET | Freq: Four times a day (QID) | ORAL | 1 refills | Status: DC | PRN

## 2023-03-13 MED ORDER — BUPIVACAINE HCL (PF) 0.5 % IJ SOLN
INTRAMUSCULAR | Status: AC
  Filled 2023-03-13: qty 10

## 2023-03-13 MED ORDER — ACETAMINOPHEN 325 MG PO TABS: 325.0000 mg | ORAL_TABLET | Freq: Four times a day (QID) | ORAL | Status: DC | PRN

## 2023-03-13 MED ORDER — PROMETHAZINE HCL 25 MG/ML IJ SOLN: 6.2500 mg | INTRAMUSCULAR | Status: DC | PRN

## 2023-03-13 MED ORDER — LIDOCAINE HCL (PF) 2 % IJ SOLN
INTRAMUSCULAR | Status: AC
  Filled 2023-03-13: qty 20

## 2023-03-13 MED ORDER — CHLORHEXIDINE GLUCONATE 0.12 % MT SOLN
OROMUCOSAL | Status: AC
  Filled 2023-03-13: qty 15

## 2023-03-13 MED ORDER — ONDANSETRON HCL 4 MG/2ML IJ SOLN
INTRAMUSCULAR | Status: DC | PRN
  Administered 2023-03-13: 4 mg via INTRAVENOUS

## 2023-03-13 MED ORDER — CEFAZOLIN SODIUM-DEXTROSE 2-4 GM/100ML-% IV SOLN
2.0000 g | Freq: Four times a day (QID) | INTRAVENOUS | Status: DC
  Administered 2023-03-13: 2 g via INTRAVENOUS

## 2023-03-13 MED ORDER — MIDAZOLAM HCL 2 MG/2ML IJ SOLN
INTRAMUSCULAR | Status: AC
  Filled 2023-03-13: qty 2

## 2023-03-13 MED ORDER — METOCLOPRAMIDE HCL 10 MG PO TABS: 5.0000 mg | ORAL_TABLET | Freq: Three times a day (TID) | ORAL | Status: DC | PRN

## 2023-03-13 MED ORDER — NOREPINEPHRINE 4 MG/250ML-% IV SOLN
INTRAVENOUS | Status: AC
  Filled 2023-03-13: qty 250

## 2023-03-13 MED ORDER — ONDANSETRON HCL 4 MG PO TABS: 4.0000 mg | ORAL_TABLET | Freq: Four times a day (QID) | ORAL | Status: DC | PRN

## 2023-03-13 MED ORDER — FENTANYL CITRATE (PF) 100 MCG/2ML IJ SOLN
INTRAMUSCULAR | Status: DC | PRN
  Administered 2023-03-13: 50 ug via INTRAVENOUS

## 2023-03-13 MED ORDER — BUPIVACAINE LIPOSOME 1.3 % IJ SUSP
INTRAMUSCULAR | Status: AC
  Filled 2023-03-13: qty 20

## 2023-03-13 MED ORDER — PROPOFOL 10 MG/ML IV BOLUS
INTRAVENOUS | Status: DC | PRN
  Administered 2023-03-13: 25 ug/kg/min via INTRAVENOUS
  Administered 2023-03-13: 100 mg via INTRAVENOUS

## 2023-03-13 MED ORDER — GLYCOPYRROLATE 0.2 MG/ML IJ SOLN
INTRAMUSCULAR | Status: AC
  Filled 2023-03-13: qty 4

## 2023-03-13 SURGICAL SUPPLY — 67 items
APL PRP STRL LF DISP 70% ISPRP (MISCELLANEOUS) ×1
BIT DRILL FLUTED 3.0 STRL (BIT) IMPLANT
BLADE SAW SAG 25X90X1.19 (BLADE) ×1 IMPLANT
BSPLAT GLND +2X24 MDLR (Joint) ×1 IMPLANT
CHLORAPREP W/TINT 26 (MISCELLANEOUS) ×1 IMPLANT
COOLER POLAR GLACIER W/PUMP (MISCELLANEOUS) ×1 IMPLANT
COVER BACK TABLE REUSABLE LG (DRAPES) ×1 IMPLANT
CUP SUT UNIV REV NEUTRAL 33 (Cup) IMPLANT
DRAPE 3/4 80X56 (DRAPES) ×1 IMPLANT
DRAPE INCISE IOBAN 66X45 STRL (DRAPES) ×1 IMPLANT
DRSG OPSITE POSTOP 4X8 (GAUZE/BANDAGES/DRESSINGS) ×1 IMPLANT
ELECT BLADE 6.5 EXT (BLADE) IMPLANT
ELECT CAUTERY BLADE 6.4 (BLADE) ×1 IMPLANT
ELECT REM PT RETURN 9FT ADLT (ELECTROSURGICAL) ×1
ELECTRODE REM PT RTRN 9FT ADLT (ELECTROSURGICAL) ×1 IMPLANT
GAUZE XEROFORM 1X8 LF (GAUZE/BANDAGES/DRESSINGS) ×1 IMPLANT
GLENOID UNI REV MOD 24 +2 LAT (Joint) IMPLANT
GLENOSPHERE 33+4 LAT/24 UNI RV (Joint) IMPLANT
GLOVE BIO SURGEON STRL SZ7.5 (GLOVE) ×4 IMPLANT
GLOVE BIO SURGEON STRL SZ8 (GLOVE) ×4 IMPLANT
GLOVE BIOGEL PI IND STRL 8 (GLOVE) ×2 IMPLANT
GLOVE INDICATOR 8.0 STRL GRN (GLOVE) ×1 IMPLANT
GOWN STRL REUS W/ TWL LRG LVL3 (GOWN DISPOSABLE) ×1 IMPLANT
GOWN STRL REUS W/ TWL XL LVL3 (GOWN DISPOSABLE) ×1 IMPLANT
GOWN STRL REUS W/TWL LRG LVL3 (GOWN DISPOSABLE) ×1
GOWN STRL REUS W/TWL XL LVL3 (GOWN DISPOSABLE) ×1
HANDLE YANKAUER SUCT OPEN TIP (MISCELLANEOUS) ×1 IMPLANT
HOOD PEEL AWAY T7 (MISCELLANEOUS) ×3 IMPLANT
INSERT GLENOID HUM 33 +6 (Orthopedic Implant) IMPLANT
IV NS IRRIG 3000ML ARTHROMATIC (IV SOLUTION) ×1 IMPLANT
KIT STABILIZATION SHOULDER (MISCELLANEOUS) ×1 IMPLANT
KIT TURNOVER KIT A (KITS) ×1 IMPLANT
MANIFOLD NEPTUNE II (INSTRUMENTS) ×1 IMPLANT
MASK FACE SPIDER DISP (MASK) ×1 IMPLANT
MAT ABSORB FLUID 56X50 GRAY (MISCELLANEOUS) ×1 IMPLANT
NDL MAYO CATGUT SZ1 (NEEDLE) IMPLANT
NDL SAFETY ECLIP 18X1.5 (MISCELLANEOUS) ×1 IMPLANT
NDL SPNL 20GX3.5 QUINCKE YW (NEEDLE) ×1 IMPLANT
NEEDLE MAYO CATGUT SZ1 (NEEDLE) IMPLANT
NEEDLE SPNL 20GX3.5 QUINCKE YW (NEEDLE) ×1 IMPLANT
NS IRRIG 500ML POUR BTL (IV SOLUTION) ×1 IMPLANT
PACK ARTHROSCOPY SHOULDER (MISCELLANEOUS) ×1 IMPLANT
PAD ARMBOARD 7.5X6 YLW CONV (MISCELLANEOUS) ×1 IMPLANT
PAD WRAPON POLAR SHDR UNIV (MISCELLANEOUS) ×1 IMPLANT
PIN NITINOL TARGETER 2.8 (PIN) IMPLANT
PULSAVAC PLUS IRRIG FAN TIP (DISPOSABLE) ×1
SCREW CENTRAL MODULAR 25 (Screw) IMPLANT
SCREW PERI LOCK 5.5X16 (Screw) IMPLANT
SCREW PERI LOCK 5.5X24 (Screw) IMPLANT
SCREW PERIPHERAL 5.5X28 LOCK (Screw) IMPLANT
SLING ULTRA II M (MISCELLANEOUS) IMPLANT
SPONGE T-LAP 18X18 ~~LOC~~+RFID (SPONGE) ×2 IMPLANT
STAPLER SKIN PROX 35W (STAPLE) ×1 IMPLANT
STEM HUM UNIV REV 9 (Stem) IMPLANT
SUT ETHIBOND 0 MO6 C/R (SUTURE) ×1 IMPLANT
SUT FIBERWIRE #2 38 BLUE 1/2 (SUTURE) ×4
SUT VIC AB 0 CT1 36 (SUTURE) ×1 IMPLANT
SUT VIC AB 2-0 CT1 27 (SUTURE) ×2
SUT VIC AB 2-0 CT1 TAPERPNT 27 (SUTURE) ×2 IMPLANT
SUTURE FIBERWR #2 38 BLUE 1/2 (SUTURE) ×4 IMPLANT
SYR 10ML LL (SYRINGE) ×1 IMPLANT
SYR 30ML LL (SYRINGE) ×1 IMPLANT
SYR TOOMEY 50ML (SYRINGE) ×1 IMPLANT
TIP FAN IRRIG PULSAVAC PLUS (DISPOSABLE) ×1 IMPLANT
TRAP FLUID SMOKE EVACUATOR (MISCELLANEOUS) ×1 IMPLANT
WATER STERILE IRR 500ML POUR (IV SOLUTION) ×1 IMPLANT
WRAPON POLAR PAD SHDR UNIV (MISCELLANEOUS) ×1

## 2023-03-13 NOTE — Discharge Instructions (Addendum)
Orthopedic discharge instructions: May shower with intact op-site dressing once nerve block has worn off (Saturday). Apply ice frequently to shoulder or use Polar Care device. Take ibuprofen 600-800 mg TID with meals for 3-5 days, then as necessary. Take pain medication as prescribed when needed.  May supplement with ES Tylenol if necessary. Keep shoulder immobilizer on at all times except may remove for bathing purposes and for exercises. Follow-up in 10-14 days or as scheduled.  AMBULATORY SURGERY  DISCHARGE INSTRUCTIONS   The drugs that you were given will stay in your system until tomorrow so for the next 24 hours you should not:  Drive an automobile Make any legal decisions Drink any alcoholic beverage   You may resume regular meals tomorrow.  Today it is better to start with liquids and gradually work up to solid foods.  You may eat anything you prefer, but it is better to start with liquids, then soup and crackers, and gradually work up to solid foods.   Please notify your doctor immediately if you have any unusual bleeding, trouble breathing, redness and pain at the surgery site, drainage, fever, or pain not relieved by medication.    Additional Instructions:        Please contact your physician with any problems or Same Day Surgery at [phone], Monday through Friday 6 am to 4 pm, or [place] at [facility] number at [phone].

## 2023-03-13 NOTE — Anesthesia Postprocedure Evaluation (Signed)
Anesthesia Post Note  Patient: Kristi Greer  Procedure(s) Performed: REVERSE SHOULDER ARTHROPLASTY (Left: Shoulder)  Patient location during evaluation: PACU Anesthesia Type: General Level of consciousness: awake and alert Pain management: pain level controlled Vital Signs Assessment: post-procedure vital signs reviewed and stable Respiratory status: spontaneous breathing, nonlabored ventilation and respiratory function stable Cardiovascular status: blood pressure returned to baseline and stable Postop Assessment: no apparent nausea or vomiting Anesthetic complications: no   No notable events documented.   Last Vitals:  Vitals:   03/13/23 1137 03/13/23 1353  BP: (!) 103/59 (!) 99/58  Pulse: 63   Resp: 18 18  Temp: (!) 36.1 C (!) 36.1 C  SpO2: 93% 95%    Last Pain:  Vitals:   03/13/23 1137  TempSrc: Temporal  PainSc: 0-No pain                 Foye Deer

## 2023-03-13 NOTE — Telephone Encounter (Signed)
Called patient daughter (per DPR) advised that her mother had a procedure today and they advised her to take Ibuprofen 600-800 mg three times daily for the next 3-5 days. They wanted to clarify with MD that this was okay to do as they were told previously for her not to take Ibuprofen. Advised I would route to MD.  Patient daughter verbalized understanding.

## 2023-03-13 NOTE — Evaluation (Signed)
Occupational Therapy Evaluation Patient Details Name: Kristi Greer MRN: [id number] DOB: Jun 10, 1951 Today's Date: 03/13/2023   History of Present Illness pt is a 72 year old female s/p L reverse total shoulder replacement 03/13/23; pmh significant for atrial fibrillation, NICM, CHF, SVT/long RP tachycardia, NSVT,  HTN, HLD, hypothyroidism, DOE, GERD (uses CaCo3 tablets PRN), hiatal hernia, anemia, LEFT rotator cuff tear, LEFT rotator cuff tendinitis, chronic back pain   Clinical Impression   Patient was seen for an OT evaluation this date. Patient presents with impaired strength/ROM, pain, and sensation to LUE with block not completely resolved yet. These impairments result in a decreased ability to perform self care tasks requiring CGA for LB dressing and bathing and MAX assist for application of polar care, compression stockings, and sling/immobilizer. Pt and daugther instructed in polar care mgt, compression stockings mgt, sling/immobilizer mgt, ROM exercises, LUE precautions, adaptive strategies for bathing/dressing/toileting/grooming, positioning and considerations for sleep, and home/routines modifications to maximize falls prevention, safety, and independence. Handout provided. OT adjusted sling/immobilizer and polar care to improve comfort, optimize positioning, and to maximize skin integrity/safety. Pt and daughter verbalized understanding of all education/training provided. Pt will benefit from skilled OT services to address these limitations and improve independence in daily tasks. Pt is clear to dc from an OT perspective, will follow while in house.    Recommendations for follow up therapy are one component of a multi-disciplinary discharge planning process, led by the attending physician.  Recommendations may be updated based on patient status, additional functional criteria and insurance authorization.   Assistance Recommended at Discharge Intermittent Supervision/Assistance  Patient  can return home with the following A little help with walking and/or transfers;A little help with bathing/dressing/bathroom    Functional Status Assessment  Patient has had a recent decline in their functional status and demonstrates the ability to make significant improvements in function in a reasonable and predictable amount of time.  Equipment Recommendations  None recommended by OT    Recommendations for Other Services       Precautions / Restrictions Precautions Precautions: Fall;Shoulder Shoulder Interventions: Shoulder sling/immobilizer Restrictions Weight Bearing Restrictions: Yes LUE Weight Bearing: Non weight bearing      Mobility Bed Mobility               General bed mobility comments: Nt in recliner pre/post session    Transfers Overall transfer level: Needs assistance   Transfers: Sit to/from Stand Sit to Stand: Supervision, Min guard                  Balance Overall balance assessment: Needs assistance Sitting-balance support: Feet supported Sitting balance-Leahy Scale: Good     Standing balance support: No upper extremity supported Standing balance-Leahy Scale: Good                             ADL either performed or assessed with clinical judgement   ADL Overall ADL's : Needs assistance/impaired Eating/Feeding: Set up;Sitting               Upper Body Dressing : Maximal assistance;Sitting Upper Body Dressing Details (indicate cue type and reason): shirt, sling; educated patient and daugther re: techniques Lower Body Dressing: Min guard;Sit to/from stand Lower Body Dressing Details (indicate cue type and reason): underwear and shorts Toilet Transfer: Supervision/safety;Min guard;Ambulation Toilet Transfer Details (indicate cue type and reason): simulated         Functional mobility during ADLs: Supervision/safety;Min guard  Vision Patient Visual Report: No change from baseline               Pertinent Vitals/Pain Pain Assessment Pain Assessment: No/denies pain     Hand Dominance Right   Extremity/Trunk Assessment Upper Extremity Assessment Upper Extremity Assessment: LUE deficits/detail LUE Deficits / Details: shoulder NT; AROM elbow unable, wristapprox 1/4 full AROM, can wiggle finers LUE Sensation: decreased light touch   Lower Extremity Assessment Lower Extremity Assessment: Overall WFL for tasks assessed   Cervical / Trunk Assessment Cervical / Trunk Assessment: Normal   Communication Communication Communication: No difficulties   Cognition Arousal/Alertness: Awake/alert Behavior During Therapy: WFL for tasks assessed/performed Overall Cognitive Status: Within Functional Limits for tasks assessed                                       General Comments  dressing intact/in place pre/post session; vital signs appear stable throughout, no c/o dizziness throughout evaluation    Exercises Shoulder Exercises Elbow Flexion: AAROM Elbow Extension: AAROM Wrist Flexion: AAROM Wrist Extension: AAROM Digit Composite Flexion: AROM Composite Extension: AROM Hand Exercises Forearm Supination: AAROM Forearm Pronation: AAROM Other Exercises Other Exercises: pt trialed 4 steps up/down simulating home set up with CGA up, supervision down   Shoulder Instructions Shoulder Instructions Donning/doffing shirt without moving shoulder: Maximal assistance Pendulum exercises (written home exercise program): Maximal assistance ROM for elbow, wrist and digits of operated UE: Maximal assistance Sling wearing schedule (on at all times/off for ADL's): Maximal assistance    Home Living Family/patient expects to be discharged to:: Private residence Living Arrangements: Alone Available Help at Discharge: Family;Available 24 hours/day Type of Home: House Home Access: Stairs to enter [employer] of Steps: 4-5 Entrance Stairs-Rails: Right;Left Home  Layout: One level     Bathroom Shower/Tub: Walk-in shower (lip and built in seat)   Firefighter: Handicapped height     Home Equipment: Hand held shower head;Grab bars - tub/shower;BSC/3in1   Additional Comments: pt daughter will be discharging home with her to help      Prior Functioning/Environment Prior Level of Function : Independent/Modified Independent;Driving                        OT Problem List: Decreased activity tolerance      OT Treatment/Interventions: Self-care/ADL training;Energy conservation    OT Goals(Current goals can be found in the care plan section) Acute Rehab OT Goals Patient Stated Goal: rehab shoulder OT Goal Formulation: With patient/family Time For Goal Achievement: 03/27/23 Potential to Achieve Goals: Good ADL Goals Pt Will Perform Upper Body Dressing: with modified independence Pt Will Transfer to Toilet: with modified independence;ambulating Pt Will Perform Toileting - Clothing Manipulation and hygiene: with modified independence;sit to/from stand  OT Frequency: Min 1X/week       AM-PAC OT "6 Clicks" Daily Activity     Outcome Measure Help from another person eating meals?: None Help from another person taking care of personal grooming?: None Help from another person toileting, which includes using toliet, bedpan, or urinal?: A Little Help from another person bathing (including washing, rinsing, drying)?: A Little Help from another person to put on and taking off regular upper body clothing?: A Lot Help from another person to put on and taking off regular lower body clothing?: A Little 6 Click Score: 19   End of Session Equipment Utilized During Treatment: Other (comment) (sling)  Nurse Communication: Mobility status  Activity Tolerance: Patient tolerated treatment well Patient left: in chair;with call bell/phone within reach;with family/visitor present;with nursing/sitter in room  OT Visit Diagnosis: Other abnormalities  of gait and mobility (R26.89)                Time: 5621-3086 OT Time Calculation (min): 23 min Charges:  OT General Charges $OT Visit: 1 Visit OT Evaluation $OT Eval Low Complexity: 1 Low  Oleta Mouse, OTD OTR/L  03/13/23, 1:52 PM

## 2023-03-13 NOTE — Telephone Encounter (Signed)
Patient's daughter states the patient was advised to take Ibuprofen every 6 hours after having a procedure today, but she recalls being told to never take Ibuprofen in the past. Please advise.

## 2023-03-13 NOTE — Anesthesia Procedure Notes (Signed)
Anesthesia Regional Block: Interscalene brachial plexus block   Pre-Anesthetic Checklist: , timeout performed,  Correct Patient, Correct Site, Correct Laterality,  Correct Procedure, Correct Position, site marked,  Risks and benefits discussed,  Surgical consent,  Pre-op evaluation,  At surgeon's request and post-op pain management  Laterality: Upper and Left  Prep: chloraprep       Needles:  Injection technique: Single-shot  Needle Type: Stimiplex     Needle Length: 9cm  Needle Gauge: 22     Additional Needles:   Procedures:,,,, ultrasound used (permanent image in chart),,    Narrative:  Start time: 03/13/2023 7:22 AM End time: 03/13/2023 7:25 AM Injection made incrementally with aspirations every 5 mL.  Performed by: Personally  Anesthesiologist: Foye Deer, MD  Additional Notes: Patient consented for risk and benefits of nerve block including but not limited to nerve damage, failed block, bleeding and infection.  Patient voiced understanding.  Functioning IV was confirmed and monitors were applied.  Timeout done prior to procedure and prior to any sedation being given to the patient.  Patient confirmed procedure site prior to any sedation given to the patient. Sterile prep,hand hygiene and sterile gloves were used.  Minimal sedation used for procedure.  No paresthesia endorsed by patient during the procedure.  Negative aspiration and negative test dose prior to incremental administration of local anesthetic. The patient tolerated the procedure well with no immediate complications.

## 2023-03-13 NOTE — Anesthesia Procedure Notes (Signed)
Procedure Name: Intubation Date/Time: 03/13/2023 7:41 AM  Performed by: Merlene Pulling, CRNAPre-anesthesia Checklist: Patient identified, Patient being monitored, Timeout performed, Emergency Drugs available and Suction available Patient Re-evaluated:Patient Re-evaluated prior to induction Oxygen Delivery Method: Circle system utilized Preoxygenation: Pre-oxygenation with 100% oxygen Induction Type: IV induction Ventilation: Mask ventilation without difficulty Laryngoscope Size: Mac and 3 Grade View: Grade I Tube type: Oral Tube size: 7.0 mm Number of attempts: 1 Airway Equipment and Method: Stylet Placement Confirmation: ETT inserted through vocal cords under direct vision, positive ETCO2 and breath sounds checked- equal and bilateral Secured at: 21 cm Tube secured with: Tape Dental Injury: Teeth and Oropharynx as per pre-operative assessment  Comments: Atraumatic intubation. SRNA placed ETT under direct supervision. MDA and CRNA at bedside.

## 2023-03-13 NOTE — H&P (Signed)
History of Present Illness:  Kristi Greer is a 72 y.o. female who presents for follow-up of her left shoulder pain secondary to impingement/tendinopathy with a large rotator cuff tear. The patient notes little change in her symptoms since her last visit nearly 6 weeks ago. She continues to experience moderate pain in her shoulder with activities, but denies any pain at rest. Her symptoms are worse with activities at or above shoulder level, as well as when reaching behind her back. She has been applying ice and/or heat, and taking Tylenol as necessary with limited benefit. Since her last visit, she has undergone an MRI scan of the left shoulder and presents today to review these results.  Current Outpatient Medications:  acetaminophen (TYLENOL) 650 MG ER tablet Take 650 mg by mouth 2 (two) times daily  amoxicillin (AMOXIL) 500 MG capsule Take 4 tablets 1 hour prior to dental procedure. 4 capsule 3  calcium carbonate (TUMS) 200 mg calcium (500 mg) chewable tablet Take by mouth Chew 1 tablet by mouth daily as needed for indigestion or heartburn  CALCIUM CARBONATE/VITAMIN D3 (CALCIUM 600 + D,3, ORAL) Take 600 mg by mouth nightly.  carvedilol (COREG) 12.5 MG tablet TAKE 1 TABLET (12.5 MG TOTAL) BY MOUTH 2 (TWO) TIMES DAILY WITH A MEAL. 3  cholecalciferol (VITAMIN D3) 1000 unit tablet Take 185 mg by mouth daily.  fexofenadine (ALLEGRA) 180 MG tablet Take 180 mg by mouth once daily.  FUROsemide (LASIX) 20 MG tablet Take 20 mg by mouth every other day.  methIMAzole (TAPAZOLE) 5 MG tablet Take 2.5 mg by mouth once daily  pantoprazole (PROTONIX) 40 MG DR tablet Take 1 tablet (40 mg total) by mouth once daily 30 min prior to meals. 90 tablet 3  rosuvastatin (CRESTOR) 10 MG tablet TAKE 1 TABLET BY MOUTH EVERY DAY  sodium, potassium, and magnesium (SUPREP) oral solution Take 1 Bottle by mouth as directed One kit contains 2 bottles. Take both bottles at the times instructed by your provider. 354 mL 0   spironolactone (ALDACTONE) 25 MG tablet Take 25 mg by mouth once daily  SUMAtriptan (IMITREX) 100 MG tablet TAKE 1/2 TO 1 TABLET BY MOUTH AT HEADACHE ONSET 9 tablet 0  triamcinolone (NASACORT AQ) 55 mcg nasal spray by Nasal route Place 1 spray into the nose daily as needed (allergies).  zonisamide (ZONEGRAN) 100 MG capsule TAKE 1 CAPSULE (100 MG TOTAL) BY MOUTH ONCE DAILY. PATIENT NEEDS APPOINTMENT 90 capsule 0   Allergies: No Known Allergies  Past Medical History:  Cardiomyopathy, nonischemic (CMS/HHS-HCC)  Chronic gastritis 10/03/2016  Colon polyp  Constipation due to slow transit  Diverticulosis 03/19/2017  History of anesthesia reaction  hard to awaken from colonscopy  Incomplete bladder emptying  Migraines  PONV (postoperative nausea and vomiting)   Past Surgical History:  CHOLECYSTECTOMY 2006  HYSTERECTOMY VAGINAL N/A 10/18/2012  Procedure: HYSTERECTOMY VAGINAL; Surgeon: Cathie Hoops, MD; Location: [facility]; Service: Gynecology; Laterality: N/A;  COLPOPEXY FOR SUSPENSION UTEROSACRUM INTRAPERITONEAL N/A 10/18/2012  Procedure: COLPOPEXY FOR SUSPENSION UTEROSACRUM INTRAPERITONEAL; Surgeon: Cathie Hoops, MD; Location: [facility]; Service: Gynecology; Laterality: N/A;  COLPORRHAPHY FOR REPAIR CYSTOCELE ANTERIOR N/A 10/18/2012  Procedure: COLPORRHAPHY FOR REPAIR CYSTOCELE ANTERIOR; Surgeon: Cathie Hoops, MD; Location: [facility]; Service: Gynecology; Laterality: N/A;  CYSTOURETHROSCOPY N/A 10/18/2012  Procedure: CYSTOURETHROSCOPY; Surgeon: Cathie Hoops, MD; Location: [facility]; Service: Gynecology; Laterality: N/A;  trigger finger release Right 03/24/2016 (Dr. Rosita Kea)  EGD 10/03/2016 (Chronic gastritis/Negative for H. Pylori and Barrett's/No Repeat/MUS)  Right Carpal Tunnel Release 01/26/17  Right 01/26/2017 (Dr. 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 Joice Lofts)  COLONOSCOPY 02/21/2021 (Sessile serrated adenoma/Repeat 19yrs/TKT)  INCISION TENDON SHEATH FOR TRIGGER FINGER Right 08/31/2021 (Right middle finger, by Dr. Rosita Kea)  Basal Cell Carsonoma Removal  CATARACT EXTRACTION  HYSTEROSCOPY 2011,2012  TONSILLECTOMY   Family History:  Diabetes Mother  Leukemia Maternal Grandmother  Anesthesia problems Father (PONV)  Malignant hypertension Neg Hx   Social History:   Socioeconomic History:  Marital status: Widowed  Tobacco Use  Smoking status: Never  Smokeless tobacco: Never  Vaping Use  Vaping status: Never Used  Substance and Sexual Activity  Alcohol use: Yes  Comment: rare celebrations  Drug use: No  Sexual activity: Yes  Partners: Male   Social Determinants of Health:   Financial Resource Strain: Low Risk (11/20/2022)  Received from [facility]  Overall Financial Resource Strain (CARDIA)  Difficulty of Paying Living Expenses: Not hard at all  Food Insecurity: No Food Insecurity (11/20/2022)  Received from [facility]  Hunger Vital Sign  Worried About Running Out of Food in the Last Year: Never true  Ran Out of Food in the Last Year: Never true  Transportation Needs: No Transportation Needs (11/20/2022)  Received from [facility]  Lack of Transportation (Medical): No  Lack of Transportation (Non-Medical): No   Review of Systems:  A comprehensive 14 point ROS was performed, reviewed, and the pertinent orthopaedic findings are documented in the HPI.  Physical Exam: Vitals:  02/23/23 1050  BP: 118/78  Weight: 73.8 kg (162 lb 9.6 oz)  Height: 154.9 cm (5\' 1" )  PainSc: 0-No pain  PainLoc: Shoulder   General/Constitutional: The patient appears to be well-nourished, well-developed, and in no acute distress. Neuro/Psych: Normal mood and affect, oriented to person, place and time. Eyes: Non-icteric. Pupils are equal, round, and reactive to light, and exhibit synchronous movement. ENT:  Unremarkable. Lymphatic: No palpable adenopathy. Respiratory: Lungs clear to auscultation, Normal chest excursion, No wheezes, and Non-labored breathing Cardiovascular: Regular rate and rhythm. No murmurs. and No edema, swelling or tenderness, except as noted in detailed exam. Integumentary: No impressive skin lesions present, except as noted in detailed exam. Musculoskeletal: Unremarkable, except as noted in detailed exam.  Left shoulder exam: SKIN: Normal SWELLING: None WARMTH: None LYMPH NODES: No adenopathy palpable CREPITUS: None TENDERNESS: Minimally tender along lateral acromion ROM (active):  Forward flexion: 155 degrees Abduction: 150 degrees Internal rotation: L1 ROM (passive):  Forward flexion: 160 degrees Abduction: 155 degrees ER/IR at 90 abd: 90 degrees/65 degrees  She describes minimal pain at the extremes of forward flexion, abduction, and internal rotation.  STRENGTH: Forward flexion: 4/5 Abduction: 4/5 External rotation: 4-4+/5 Internal rotation: 4+/5 Pain with RC testing: No  STABILITY: Normal  SPECIAL TESTS: Juanetta Gosling' test: Minimally positive Speed's test: negative Capsulitis - pain w/ passive ER: no Crossed arm test: no Crank: Not evaluated Anterior apprehension: Negative Posterior apprehension: Not evaluated  She remains neurovascularly intact to the left upper extremity.  X-rays/MRI/Lab data:  A recent MRI scan of the left shoulder is available for review and has been reviewed by myself. By report, the study demonstrates evidence of a chronic large tear of the left shoulder, primarily involving the supraspinatus with extension into the anterior portion of the infraspinatus tendons, resulting in a high riding humeral head. There is mild to moderate atrophy with fatty replacement of all the rotator cuff muscles. No significant degenerative changes of the  glenohumeral joint are noted. Both the films report were reviewed by myself and discussed with the  patient.  Assessment:  1. Nontraumatic complete tear of left rotator cuff.  2. Rotator cuff tendinitis, left.   Plan: The treatment options were discussed with the patient. In addition, patient educational materials were provided regarding the diagnosis and treatment options. The patient is quite frustrated by her symptoms and functional limitations, and is ready to consider more aggressive treatment options. Therefore, I have recommended a surgical procedure, specifically a reverse left total shoulder arthroplasty. The procedure was discussed with the patient, as were the potential risks (including bleeding, infection, nerve and/or blood vessel injury, persistent or recurrent pain, loosening and/or failure of the components, dislocation, need for further surgery, blood clots, strokes, heart attacks and/or arhythmias, pneumonia, etc.) and benefits. The patient states his/her understanding and wishes to proceed. All of the patient's questions and concerns were answered. She can call any time with further concerns. She will follow up post-surgery, routine.    H&P reviewed and patient re-examined. No changes.

## 2023-03-13 NOTE — Transfer of Care (Signed)
FFImmediate Anesthesia Transfer of Care Note  Patient: Kristi Greer  Procedure(s) Performed: REVERSE SHOULDER ARTHROPLASTY (Left: Shoulder)  Patient Location: PACU  Anesthesia Type:General and Regional  Level of Consciousness: drowsy  Airway & Oxygen Therapy: Patient Spontanous Breathing and Patient connected to face mask oxygen  Post-op Assessment: Report given to RN and Post -op Vital signs reviewed and stable  Post vital signs: Reviewed  Last Vitals:  Vitals Value Taken Time  BP 120/80   Temp 20F   Pulse 69 03/13/23 1003  Resp 19 03/13/23 1003  SpO2 99 % 03/13/23 1003  Vitals shown include unfiled device data.  Last Pain:  Vitals:   03/13/23 0618  TempSrc: Temporal  PainSc: 0-No pain         Complications: No notable events documented.

## 2023-03-13 NOTE — Op Note (Signed)
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  Post-Op Diagnosis:   Same  Procedure:   Reverse left total shoulder arthroplasty with biceps tenodesis.  Surgeon:   Maryagnes Amos, MD  Assistant:   Horris Latino, PA-C  Anesthesia:   General endotracheal with an interscalene block using Exparel placed preoperatively by the anesthesiologist.  Findings:   As above.  Complications:   None  EBL:   100 cc  Fluids:   600 cc crystalloid  UOP:   None  TT:   None  Drains:   None  Closure:   Staples  Implants:   All press-fit Arthrex system with a #9 Apex humeral stem, a 33 mm SutureCup with a +6 mm insert, and a 24 mm +2 mm lateralized base plate with a 33 mm +4 lateralized glenosphere.  Brief Clinical Note:   The patient is a 72 year old female with a history of progressively worsening left shoulder pain and weakness. Her symptoms have progressed despite medications, activity modification, etc. Her history and examination are consistent with a massive irreparable rotator cuff tear with early cuff arthropathy, all of which were confirmed by preoperative MRI scanning. The patient presents at this time for a reverse left total shoulder arthroplasty with biceps tenodesis.  Procedure:   The patient underwent placement of an interscalene block using Exparel by the anesthesiologist in the preoperative holding area before being brought into the operating room and lain in the supine position. The patient then underwent general endotracheal intubation and anesthesia before the patient was repositioned in the beach chair position using the beach chair positioner. The left shoulder and upper extremity were prepped with ChloraPrep solution before being draped sterilely. Preoperative antibiotics were administered. A timeout was performed to verify the appropriate surgical  site.    A standard anterior approach to the shoulder was made through an approximately 4-5 inch incision. The incision was carried down through the subcutaneous tissues to expose the deltopectoral fascia. The interval between the deltoid and pectoralis muscles was identified and this plane developed, retracting the cephalic vein laterally with the deltoid muscle. The conjoined tendon was identified. Its lateral margin was dissected and the Kolbel self-retraining retractor inserted. The "three sisters" were identified and cauterized. Bursal tissues were removed to improve visualization.   The biceps tendon was identified near the inferior aspect of the bicipital groove. A soft tissue tenodesis was performed by attaching the biceps tendon to the adjacent pectoralis major tendon using two #0 Ethibond interrupted sutures. The biceps tendon was then transected just proximal to the tenodesis site. The subscapularis tendon was released from its attachment to the lesser tuberosity 1 cm proximal to its insertion and several tagging sutures placed. The inferior capsule was released with care after identifying and protecting the axillary nerve. The proximal humeral cut was made at approximately 30 of retroversion using the extra-medullary guide.   Attention was redirected to the glenoid. The labrum was debrided circumferentially before the center of the glenoid was marked with electrocautery. The guidewire was drilled into the glenoid neck using the appropriate guide. After verifying its position, it was overreamed with the baseplate reamer to create a flat surface before the peripheral reamer was introduced to clean off any peripheral soft tissues. The central 10 mm coring reamer was then inserted before the hole was tapped to complete the glenoid preparation. The permanent mini-baseplate construct  with a 25 mm screw attachment was screwed into place and tightened securely. The baseplate was then further secured using  four peripheral locking screws. The permanent 33 mm +4 mm lateralized glenosphere was then impacted into place and its Morse taper locking mechanism verified using manual distraction. Finally, the glenosphere locking screw was inserted and tightened securely.  Attention was directed to the humeral side. The humeral canal was reamed with first the 5 mm and then the 6 mm reamer. The canal was broached beginning with a #5 broach and progressing to a #9 broach. This was left in place and the metaphyseal inset reamer was used to create the metaphyseal socket.  A trial reduction performed using the 36 mm trial humeral platform with the +3 mm insert. The arm demonstrated excellent range of motion as the hand could be brought across the chest to the opposite shoulder and brought to the top of the patient's head and to the patient's ear. The shoulder appeared stable throughout this range of motion. The joint was dislocated and the trial components removed. The permanent #9 Apex stem was impacted into place with care taken to maintain the appropriate version. The permanent 33 mm SutureCup humeral platform was then impacted into place before the +6 mm insert was snapped into place. The shoulder was relocated using two finger pressure and again placed through a range of motion with the findings as described above.  The wound was copiously irrigated with sterile saline solution using the jet lavage system before a total of 30 cc of 0.5% Sensorcaine with epinephrine was injected into the pericapsular and peri-incisional tissues to help with postoperative analgesia. The subscapularis tendon was reapproximated using #2 FiberWire interrupted sutures. The deltopectoral interval was closed using #0 Vicryl interrupted sutures before the subcutaneous tissues were closed using 2-0 Vicryl interrupted sutures. The skin was closed using staples. Prior to closing the skin, 1 g of transexemic acid in 10 cc of normal saline was injected  intra-articularly to help with postoperative bleeding. A sterile occlusive dressing was applied to the wound before the arm was placed into a shoulder immobilizer with an abduction pillow. A Polar Care system also was applied to the shoulder. The patient was then transferred back to a hospital bed before being awakened, extubated, and returned to the recovery room in satisfactory condition after tolerating the procedure well.

## 2023-03-13 NOTE — Anesthesia Preprocedure Evaluation (Addendum)
Anesthesia Evaluation  Patient identified by MRN, date of birth, ID band Patient awake    Reviewed: Allergy & Precautions, H&P , NPO status , Patient's Chart, lab work & pertinent test results  History of Anesthesia Complications (+) PONV and history of anesthetic complications  Airway Mallampati: II  TM Distance: >3 FB Neck ROM: full    Dental no notable dental hx.    Pulmonary neg pulmonary ROS   Pulmonary exam normal        Cardiovascular METS: 3 - Mets hypertension, +CHF (mod DD, nonischemic cardiomyopathy and January 2017) and + DOE  Normal cardiovascular exam+ dysrhythmias (h/o afib, SVT/long RP tachycardia, NSVT)   ECHO 2019: Study Conclusions  - Left ventricle: The cavity size was normal. Wall thickness was    normal. Systolic function was normal. The estimated ejection    fraction was in the range of 50% to 55%. Wall motion was normal;    there were no regional wall motion abnormalities. Features are    consistent with a pseudonormal left ventricular filling pattern,    with concomitant abnormal relaxation and increased filling    pressure (grade 2 diastolic dysfunction).  Impressions:  - Normal LV systolic function; moderate diastolic dysfunction.    LHC 2017 Assessment: 1. Normal coronary arteries 2. 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 Well compensated hemodynamics   Neuro/Psych negative neurological ROS  negative psych ROS   GI/Hepatic Neg liver ROS, hiatal hernia,GERD  ,,  Endo/Other   Hyperthyroidism   Renal/GU      Musculoskeletal  (+) Arthritis ,    Abdominal  (+) + obese  Peds  Hematology negative hematology ROS (+)   Anesthesia Other Findings Per cardiology, "according to the RCRI, patient has a 0.9% risk of MACE. Patient reports activity equivalent to >4.0 METS (yard work, light to moderate household chores). Given past medical history and time since last visit, based on ACC/AHA  guidelines, Londen W Camberos would be at ACCEPTABLE risk for the planned procedure without further cardiovascular testing  Past Medical History: No date: Allergy No date: Anemia No date: Arthritis No date: Atrial fibrillation (HCC) No date: Cardiomyopathy, nonischemic (HCC)     Comment:  a.) felt to be tachycardia mediated NICM; b.) TTE               09/01/2015: EF 35-40%; c.) [facility] 11/26/2015: ED 30-35%,               norm cors, mPA 16, PCWP 7, CO 4.7, CI 2.8; d.) TTE               01/31/2016: EF 40-45%; e.) cMRI 04/27/2016: EF 48%; f.)               TTE 12/06/2016: EF 55-60%; g.) TTE 03/25/2018: EF 50-55% No date: Carpal tunnel syndrome, bilateral No date: Chronic back pain No date: Congestive heart failure (HCC)     Comment:  a.) TTE 09/01/2015: EF 35-40%, mod MR; b.) R/LHC               11/26/2015: EF 30-35%; c.) TTE 01/31/2016: EF 40-45%,               mild LVH, diffuse HK, triv MR/TR; d.) TTE 12/06/2016: EF               55-60%, mild MAC, G1DD; e.) TTE 03/25/2018: EF 50-55%,               triv PR, G2DD. No date: Diverticulosis No  date: DOE (dyspnea on exertion) No date: Dyspnea on exertion No date: Dysrhythmia No date: Environmental and seasonal allergies No date: Female cystocele No date: GERD (gastroesophageal reflux disease) No date: Hiatal hernia No date: History of complications due to general anesthesia     Comment:  a.) PONV; b.) delayed emergence No date: Hyperlipidemia No date: Hypertension No date: Hyperthyroidism No date: Migraines No date: Nontraumatic complete tear of left rotator cuff 11/16/2015: NSVT (nonsustained ventricular tachycardia) (HCC)     Comment:  a.) felt to be related to NICM in the setting of               increased stressors, influenza A, hypotension Tx'd  with               IVF bolus 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 SBP unpon               arrival was in the 60s --> improved to 80s after 4L IVFs               --> started on pressors and admitted to ICU. No date: Skin cancer, basal cell No date: Squamous cell skin cancer No date: SUI (stress urinary incontinence, female) 08/31/2015: SVT (supraventricular tachycardia)     Comment:  a.) rates as high as 150 bpm; b.) EP feels as if event               was "long RP tachycardia" No date: Tendinitis of left rotator cuff No date: Uterovaginal prolapse No date: Vaginal atrophy  Past Surgical History: 10/18/2012: ANTERIOR AND POSTERIOR VAGINAL REPAIR W/ SACROSPINOUS  LIGAMENT SUSPENSION 11/26/2015: CARDIAC CATHETERIZATION; N/A     Comment:  Procedure: Right/Left Heart Cath and Coronary               Angiography;  Surgeon: Dolores Patty, MD;  Location:              [facility];  Service: Cardiovascular;                Laterality: N/A; No date: CARPAL TUNNEL RELEASE; Right No date: CATARACT EXTRACTION; Bilateral 2006: CHOLECYSTECTOMY 03/19/2017: COLONOSCOPY WITH PROPOFOL; N/A     Comment:  Procedure: COLONOSCOPY WITH PROPOFOL;  Surgeon:               Christena Deem, MD;  Location: [facility];                Service: Endoscopy;  Laterality: N/A; 10/18/2012: COLPOPEXY 04/21/2022: CYSTOSCOPY WITH BIOPSY; N/A     Comment:  Procedure: CYSTOSCOPY WITH BLADDER BIOPSY;  Surgeon:               Sondra Come, MD;  Location: [facility];  Service:               Urology;  Laterality: N/A; 10/18/2012: CYSTOURETHROSCOPY 10/03/2016: ESOPHAGOGASTRODUODENOSCOPY (EGD) WITH PROPOFOL; N/A     Comment:  Procedure: ESOPHAGOGASTRODUODENOSCOPY (EGD) WITH               PROPOFOL;  Surgeon: Christena Deem, MD;  Location:               [facility];  Service: Endoscopy;  Laterality: N/A; 2011, 10/2010: HYSTEROSCOPY No date:  OOPHORECTOMY 06/17/2019: REVERSE SHOULDER ARTHROPLASTY; Right     Comment:  Procedure: REVERSE SHOULDER ARTHROPLASTY;  Surgeon:               Christena Flake, MD;  Location: [facility];  Service:               Orthopedics;  Laterality: Right; 06/21/2022: ROBOTIC ASSISTED LAPAROSCOPIC SACROCOLPOPEXY; N/A     Comment:  Procedure: XI ROBOTIC ASSISTED LAPAROSCOPIC               SACROCOLPOPEXY;  Surgeon: Crist Fat, MD;                Location: [facility];  Service: Urology;  Laterality: N/A;                240 MINUTES No date: TONSILLECTOMY No date: TOTAL SHOULDER REPLACEMENT; Right No date: TRIGGER FINGER RELEASE; Bilateral No date: TUBAL LIGATION No date: VAGINAL HYSTERECTOMY     Comment:  midurethral sling  BMI    Body Mass Index: 30.23 kg/m      Reproductive/Obstetrics negative OB ROS                              Anesthesia Physical Anesthesia Plan  ASA: 3  Anesthesia Plan: General ETT   Post-op Pain Management: Regional block*   Induction: Intravenous  PONV Risk Score and Plan: 4 or greater and Ondansetron, Dexamethasone, Midazolam, Propofol infusion and TIVA  Airway Management Planned: Oral ETT  Additional Equipment:   Intra-op Plan:   Post-operative Plan: Extubation in OR  Informed Consent: I have reviewed the patients History and Physical, chart, labs and discussed the procedure including the risks, benefits and alternatives for the proposed anesthesia with the patient or authorized representative who has indicated his/her understanding and acceptance.     Dental Advisory Given  Plan Discussed with: CRNA and Surgeon  Anesthesia Plan Comments:          Anesthesia Quick Evaluation

## 2023-03-14 ENCOUNTER — Encounter: Payer: Self-pay | Admitting: Surgery

## 2023-03-14 DIAGNOSIS — Z791 Long term (current) use of non-steroidal anti-inflammatories (NSAID): Secondary | ICD-10-CM | POA: Diagnosis not present

## 2023-03-14 DIAGNOSIS — I428 Other cardiomyopathies: Secondary | ICD-10-CM | POA: Diagnosis not present

## 2023-03-14 DIAGNOSIS — K5790 Diverticulosis of intestine, part unspecified, without perforation or abscess without bleeding: Secondary | ICD-10-CM | POA: Diagnosis not present

## 2023-03-14 DIAGNOSIS — Z85828 Personal history of other malignant neoplasm of skin: Secondary | ICD-10-CM | POA: Diagnosis not present

## 2023-03-14 DIAGNOSIS — I10 Essential (primary) hypertension: Secondary | ICD-10-CM | POA: Diagnosis not present

## 2023-03-14 DIAGNOSIS — G43909 Migraine, unspecified, not intractable, without status migrainosus: Secondary | ICD-10-CM | POA: Diagnosis not present

## 2023-03-14 DIAGNOSIS — K219 Gastro-esophageal reflux disease without esophagitis: Secondary | ICD-10-CM | POA: Diagnosis not present

## 2023-03-14 DIAGNOSIS — E785 Hyperlipidemia, unspecified: Secondary | ICD-10-CM | POA: Diagnosis not present

## 2023-03-14 DIAGNOSIS — Z96612 Presence of left artificial shoulder joint: Secondary | ICD-10-CM | POA: Diagnosis not present

## 2023-03-14 DIAGNOSIS — Z471 Aftercare following joint replacement surgery: Secondary | ICD-10-CM | POA: Diagnosis not present

## 2023-03-14 DIAGNOSIS — K5901 Slow transit constipation: Secondary | ICD-10-CM | POA: Diagnosis not present

## 2023-03-14 DIAGNOSIS — E0590 Thyrotoxicosis, unspecified without thyrotoxic crisis or storm: Secondary | ICD-10-CM | POA: Diagnosis not present

## 2023-03-14 DIAGNOSIS — Z96611 Presence of right artificial shoulder joint: Secondary | ICD-10-CM | POA: Diagnosis not present

## 2023-03-14 NOTE — Telephone Encounter (Signed)
Pt daughter returning call. Informed her we are still waiting for Dr. Mariah Milling to respond. I know Dr. Mariah Milling is out for the week, not sure if this can forwarded to DOD since they are pretty anxious for a response. Please advise.

## 2023-03-14 NOTE — Telephone Encounter (Signed)
Called and spoke with daughter. Per daughter patient was told by cardiology in 2017 not to take Ibuprofen anymore. Patient had a shoulder replacement 03/13/23. Patient was told to take Ibuprofen 600 mg - 800 mg three times a day for 5 days. Patient is calling to see if it is okay to take the Ibuprofen. Will forward to DOD since covering MD is out of office.

## 2023-03-15 NOTE — Telephone Encounter (Signed)
Called and spoke with daughter. Informed her of the following from Dr. Herbie Baltimore.   Not my patient, but should not be a problem taking that for short period of time.   Bryan Lemma, MD    Daughter verbalizes understanding.

## 2023-03-15 NOTE — Telephone Encounter (Signed)
Not my patient, but should not be a problem taking that for short period of time.  Kristi Lemma, MD

## 2023-03-16 DIAGNOSIS — Z471 Aftercare following joint replacement surgery: Secondary | ICD-10-CM | POA: Diagnosis not present

## 2023-03-16 DIAGNOSIS — E0590 Thyrotoxicosis, unspecified without thyrotoxic crisis or storm: Secondary | ICD-10-CM | POA: Diagnosis not present

## 2023-03-16 DIAGNOSIS — I428 Other cardiomyopathies: Secondary | ICD-10-CM | POA: Diagnosis not present

## 2023-03-16 DIAGNOSIS — E785 Hyperlipidemia, unspecified: Secondary | ICD-10-CM | POA: Diagnosis not present

## 2023-03-16 DIAGNOSIS — K219 Gastro-esophageal reflux disease without esophagitis: Secondary | ICD-10-CM | POA: Diagnosis not present

## 2023-03-16 DIAGNOSIS — I10 Essential (primary) hypertension: Secondary | ICD-10-CM | POA: Diagnosis not present

## 2023-03-17 ENCOUNTER — Encounter: Payer: Self-pay | Admitting: Internal Medicine

## 2023-03-19 DIAGNOSIS — I10 Essential (primary) hypertension: Secondary | ICD-10-CM | POA: Diagnosis not present

## 2023-03-19 DIAGNOSIS — K219 Gastro-esophageal reflux disease without esophagitis: Secondary | ICD-10-CM | POA: Diagnosis not present

## 2023-03-19 DIAGNOSIS — E0590 Thyrotoxicosis, unspecified without thyrotoxic crisis or storm: Secondary | ICD-10-CM | POA: Diagnosis not present

## 2023-03-19 DIAGNOSIS — I428 Other cardiomyopathies: Secondary | ICD-10-CM | POA: Diagnosis not present

## 2023-03-19 DIAGNOSIS — Z471 Aftercare following joint replacement surgery: Secondary | ICD-10-CM | POA: Diagnosis not present

## 2023-03-19 DIAGNOSIS — E785 Hyperlipidemia, unspecified: Secondary | ICD-10-CM | POA: Diagnosis not present

## 2023-03-20 DIAGNOSIS — E0590 Thyrotoxicosis, unspecified without thyrotoxic crisis or storm: Secondary | ICD-10-CM | POA: Diagnosis not present

## 2023-03-20 DIAGNOSIS — Z471 Aftercare following joint replacement surgery: Secondary | ICD-10-CM | POA: Diagnosis not present

## 2023-03-20 DIAGNOSIS — I10 Essential (primary) hypertension: Secondary | ICD-10-CM | POA: Diagnosis not present

## 2023-03-20 DIAGNOSIS — E785 Hyperlipidemia, unspecified: Secondary | ICD-10-CM | POA: Diagnosis not present

## 2023-03-20 DIAGNOSIS — I428 Other cardiomyopathies: Secondary | ICD-10-CM | POA: Diagnosis not present

## 2023-03-20 DIAGNOSIS — K219 Gastro-esophageal reflux disease without esophagitis: Secondary | ICD-10-CM | POA: Diagnosis not present

## 2023-03-21 DIAGNOSIS — Z471 Aftercare following joint replacement surgery: Secondary | ICD-10-CM | POA: Diagnosis not present

## 2023-03-21 DIAGNOSIS — I10 Essential (primary) hypertension: Secondary | ICD-10-CM | POA: Diagnosis not present

## 2023-03-21 DIAGNOSIS — E785 Hyperlipidemia, unspecified: Secondary | ICD-10-CM | POA: Diagnosis not present

## 2023-03-21 DIAGNOSIS — K219 Gastro-esophageal reflux disease without esophagitis: Secondary | ICD-10-CM | POA: Diagnosis not present

## 2023-03-21 DIAGNOSIS — I428 Other cardiomyopathies: Secondary | ICD-10-CM | POA: Diagnosis not present

## 2023-03-21 DIAGNOSIS — E0590 Thyrotoxicosis, unspecified without thyrotoxic crisis or storm: Secondary | ICD-10-CM | POA: Diagnosis not present

## 2023-03-22 DIAGNOSIS — E785 Hyperlipidemia, unspecified: Secondary | ICD-10-CM | POA: Diagnosis not present

## 2023-03-22 DIAGNOSIS — Z471 Aftercare following joint replacement surgery: Secondary | ICD-10-CM | POA: Diagnosis not present

## 2023-03-22 DIAGNOSIS — K219 Gastro-esophageal reflux disease without esophagitis: Secondary | ICD-10-CM | POA: Diagnosis not present

## 2023-03-22 DIAGNOSIS — I428 Other cardiomyopathies: Secondary | ICD-10-CM | POA: Diagnosis not present

## 2023-03-22 DIAGNOSIS — E0590 Thyrotoxicosis, unspecified without thyrotoxic crisis or storm: Secondary | ICD-10-CM | POA: Diagnosis not present

## 2023-03-22 DIAGNOSIS — I10 Essential (primary) hypertension: Secondary | ICD-10-CM | POA: Diagnosis not present

## 2023-03-26 DIAGNOSIS — I10 Essential (primary) hypertension: Secondary | ICD-10-CM | POA: Diagnosis not present

## 2023-03-26 DIAGNOSIS — Z471 Aftercare following joint replacement surgery: Secondary | ICD-10-CM | POA: Diagnosis not present

## 2023-03-26 DIAGNOSIS — I428 Other cardiomyopathies: Secondary | ICD-10-CM | POA: Diagnosis not present

## 2023-03-26 DIAGNOSIS — K219 Gastro-esophageal reflux disease without esophagitis: Secondary | ICD-10-CM | POA: Diagnosis not present

## 2023-03-26 DIAGNOSIS — E0590 Thyrotoxicosis, unspecified without thyrotoxic crisis or storm: Secondary | ICD-10-CM | POA: Diagnosis not present

## 2023-03-26 DIAGNOSIS — E785 Hyperlipidemia, unspecified: Secondary | ICD-10-CM | POA: Diagnosis not present

## 2023-03-28 DIAGNOSIS — Z96612 Presence of left artificial shoulder joint: Secondary | ICD-10-CM | POA: Diagnosis not present

## 2023-03-28 DIAGNOSIS — M25512 Pain in left shoulder: Secondary | ICD-10-CM | POA: Diagnosis not present

## 2023-04-04 DIAGNOSIS — M25512 Pain in left shoulder: Secondary | ICD-10-CM | POA: Diagnosis not present

## 2023-04-04 DIAGNOSIS — Z96612 Presence of left artificial shoulder joint: Secondary | ICD-10-CM | POA: Diagnosis not present

## 2023-04-08 ENCOUNTER — Other Ambulatory Visit: Payer: Self-pay | Admitting: Podiatry

## 2023-04-11 DIAGNOSIS — Z96612 Presence of left artificial shoulder joint: Secondary | ICD-10-CM | POA: Diagnosis not present

## 2023-04-11 DIAGNOSIS — M25512 Pain in left shoulder: Secondary | ICD-10-CM | POA: Diagnosis not present

## 2023-04-18 DIAGNOSIS — Z96612 Presence of left artificial shoulder joint: Secondary | ICD-10-CM | POA: Diagnosis not present

## 2023-04-18 DIAGNOSIS — M25512 Pain in left shoulder: Secondary | ICD-10-CM | POA: Diagnosis not present

## 2023-04-19 ENCOUNTER — Other Ambulatory Visit: Payer: Self-pay | Admitting: Podiatry

## 2023-04-19 DIAGNOSIS — M79671 Pain in right foot: Secondary | ICD-10-CM

## 2023-04-19 DIAGNOSIS — M778 Other enthesopathies, not elsewhere classified: Secondary | ICD-10-CM

## 2023-04-23 DIAGNOSIS — M25512 Pain in left shoulder: Secondary | ICD-10-CM | POA: Diagnosis not present

## 2023-04-23 DIAGNOSIS — Z96612 Presence of left artificial shoulder joint: Secondary | ICD-10-CM | POA: Diagnosis not present

## 2023-04-25 DIAGNOSIS — Z96612 Presence of left artificial shoulder joint: Secondary | ICD-10-CM | POA: Diagnosis not present

## 2023-04-25 DIAGNOSIS — M25512 Pain in left shoulder: Secondary | ICD-10-CM | POA: Diagnosis not present

## 2023-04-26 DIAGNOSIS — Z85828 Personal history of other malignant neoplasm of skin: Secondary | ICD-10-CM | POA: Diagnosis not present

## 2023-04-26 DIAGNOSIS — Z872 Personal history of diseases of the skin and subcutaneous tissue: Secondary | ICD-10-CM | POA: Diagnosis not present

## 2023-04-26 DIAGNOSIS — L570 Actinic keratosis: Secondary | ICD-10-CM | POA: Diagnosis not present

## 2023-04-26 DIAGNOSIS — Z859 Personal history of malignant neoplasm, unspecified: Secondary | ICD-10-CM | POA: Diagnosis not present

## 2023-04-26 DIAGNOSIS — Z86018 Personal history of other benign neoplasm: Secondary | ICD-10-CM | POA: Diagnosis not present

## 2023-04-26 DIAGNOSIS — L578 Other skin changes due to chronic exposure to nonionizing radiation: Secondary | ICD-10-CM | POA: Diagnosis not present

## 2023-04-27 DIAGNOSIS — M7582 Other shoulder lesions, left shoulder: Secondary | ICD-10-CM | POA: Diagnosis not present

## 2023-05-01 DIAGNOSIS — Z96612 Presence of left artificial shoulder joint: Secondary | ICD-10-CM | POA: Diagnosis not present

## 2023-05-01 DIAGNOSIS — M25512 Pain in left shoulder: Secondary | ICD-10-CM | POA: Diagnosis not present

## 2023-05-03 DIAGNOSIS — Z96612 Presence of left artificial shoulder joint: Secondary | ICD-10-CM | POA: Diagnosis not present

## 2023-05-03 DIAGNOSIS — M25512 Pain in left shoulder: Secondary | ICD-10-CM | POA: Diagnosis not present

## 2023-05-07 ENCOUNTER — Other Ambulatory Visit: Payer: Self-pay | Admitting: Internal Medicine

## 2023-05-07 DIAGNOSIS — M25512 Pain in left shoulder: Secondary | ICD-10-CM | POA: Diagnosis not present

## 2023-05-07 DIAGNOSIS — Z96612 Presence of left artificial shoulder joint: Secondary | ICD-10-CM | POA: Diagnosis not present

## 2023-05-10 DIAGNOSIS — Z96612 Presence of left artificial shoulder joint: Secondary | ICD-10-CM | POA: Diagnosis not present

## 2023-05-10 DIAGNOSIS — M25512 Pain in left shoulder: Secondary | ICD-10-CM | POA: Diagnosis not present

## 2023-05-14 DIAGNOSIS — Z96612 Presence of left artificial shoulder joint: Secondary | ICD-10-CM | POA: Diagnosis not present

## 2023-05-14 DIAGNOSIS — M25512 Pain in left shoulder: Secondary | ICD-10-CM | POA: Diagnosis not present

## 2023-05-16 DIAGNOSIS — Z96612 Presence of left artificial shoulder joint: Secondary | ICD-10-CM | POA: Diagnosis not present

## 2023-05-16 DIAGNOSIS — M25512 Pain in left shoulder: Secondary | ICD-10-CM | POA: Diagnosis not present

## 2023-05-21 DIAGNOSIS — M25512 Pain in left shoulder: Secondary | ICD-10-CM | POA: Diagnosis not present

## 2023-05-21 DIAGNOSIS — Z96612 Presence of left artificial shoulder joint: Secondary | ICD-10-CM | POA: Diagnosis not present

## 2023-05-24 DIAGNOSIS — Z96612 Presence of left artificial shoulder joint: Secondary | ICD-10-CM | POA: Diagnosis not present

## 2023-05-24 DIAGNOSIS — M25512 Pain in left shoulder: Secondary | ICD-10-CM | POA: Diagnosis not present

## 2023-05-26 IMAGING — MR MR LUMBAR SPINE W/O CM
4 of 5 series · 28 of 48 positions shown · non-contrast
Comparison: Lumbar spine radiographs 05/11/2020, thoracic spine
radiographs 10/23/2014 and abdominopelvic CT 11/14/2015.

CLINICAL DATA: Low back pain radiating into the left hip for
several weeks. Patient slipped and fell 5 months ago. No previous
relevant surgery.

EXAM:
MRI LUMBAR SPINE WITHOUT CONTRAST
TECHNIQUE: Multiplanar, multisequence MR imaging of the lumbar spine was
performed. No intravenous contrast was administered.

[Series 3: T2 · sagittal · 4.0mm · 1.02mm/px · 6 of 15 slices shown (1 of 2)]
[im 1/15]
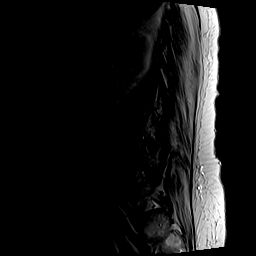
[im 3/15]
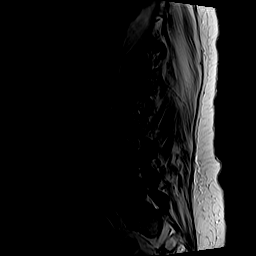
[im 6/15]
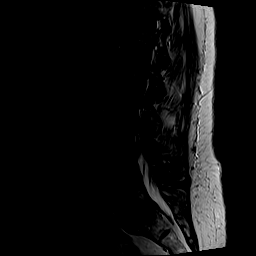
[im 9/15]
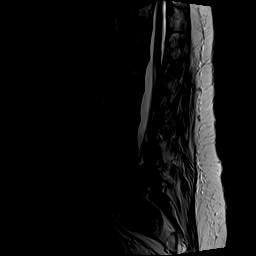
[im 12/15]
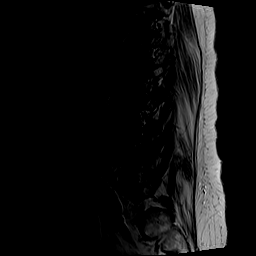
[im 15/15]
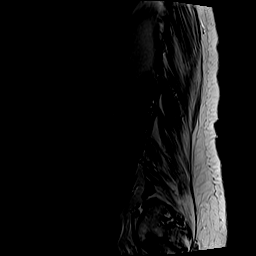

[Series 4: T1 · sagittal · 4.0mm · 0.51mm/px · 6 of 15 slices shown (1 of 2)]
[im 1/15]
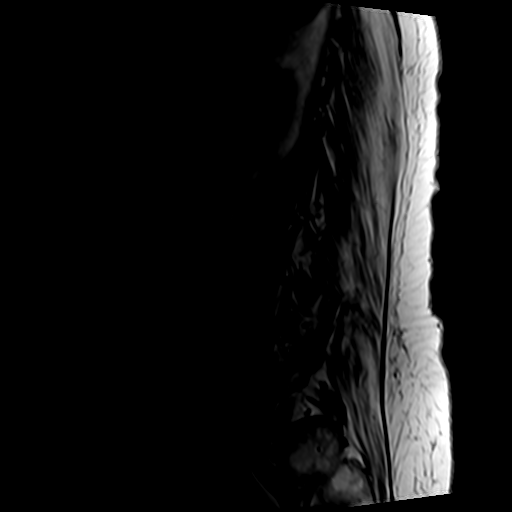
[im 3/15]
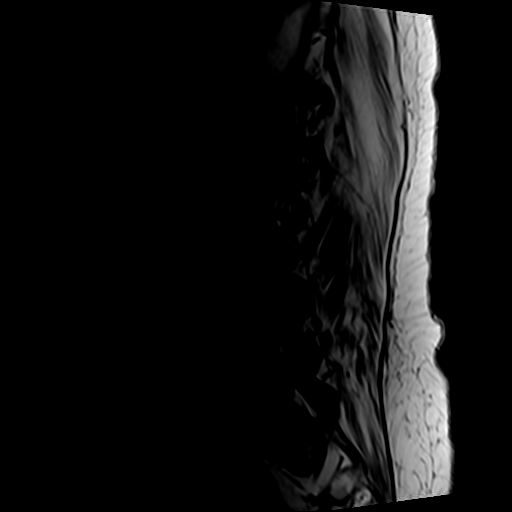
[im 6/15]
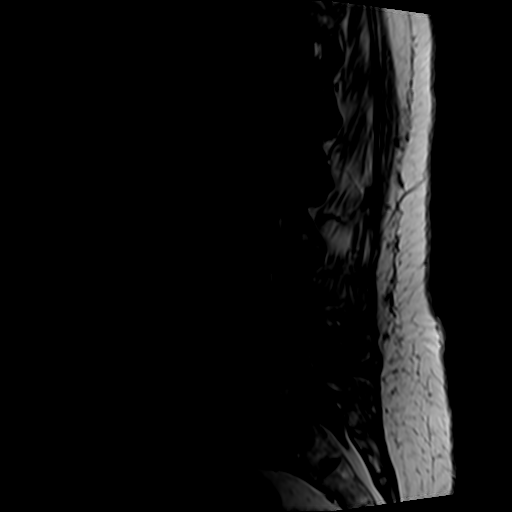
[im 9/15]
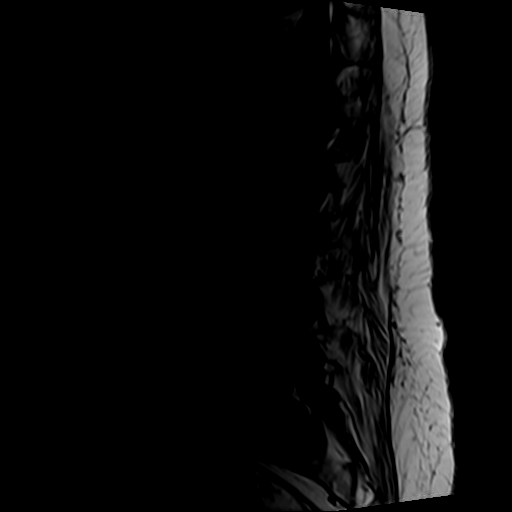
[im 12/15]
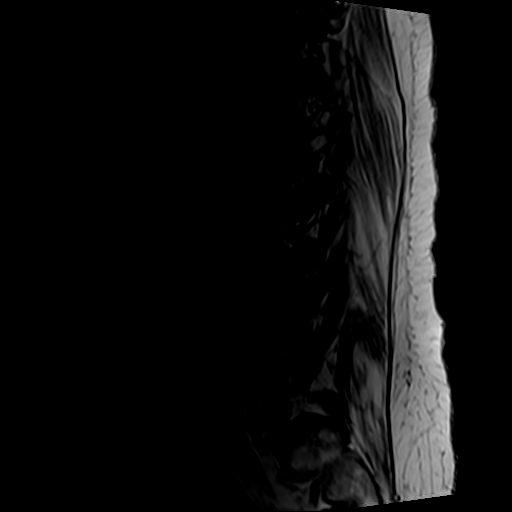
[im 15/15]
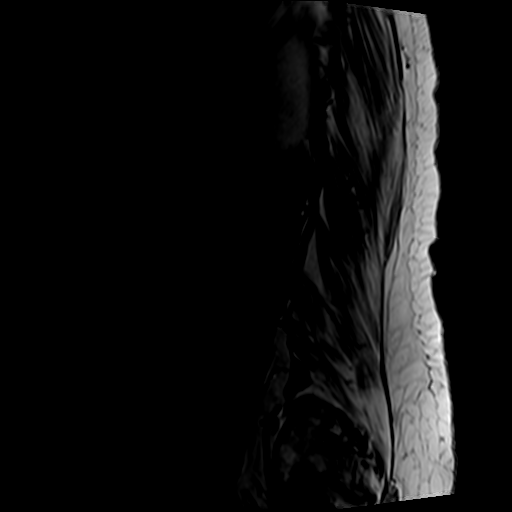

[Series 6: T2 · axial · 4.0mm · 0.78mm/px · z∈[-161,+58]mm · 9 of 39 slices shown (2 of 2)]
[im 1/39]
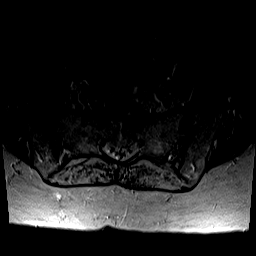
[im 6/39]
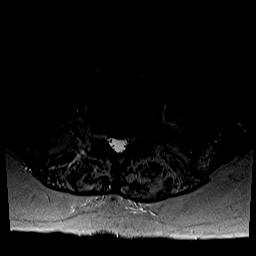
[im 11/39]
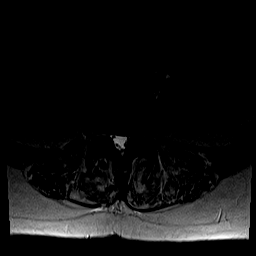
[im 17/39]
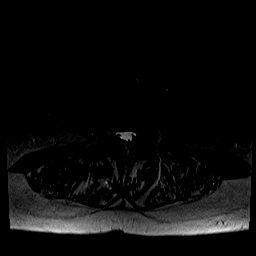
[im 20/39]
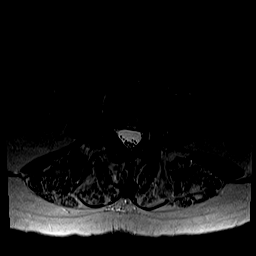
[im 22/39]
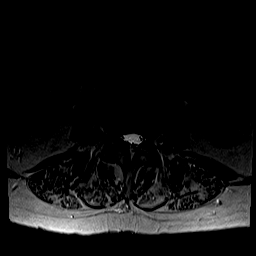
[im 28/39]
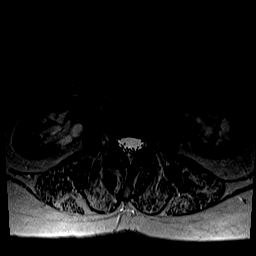
[im 33/39]
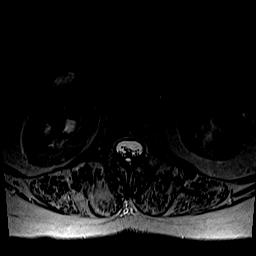
[im 39/39]
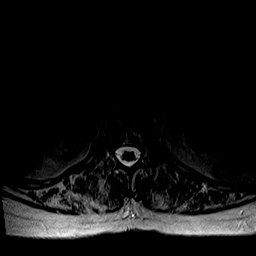

[Series 7: T1 · axial · 4.0mm · 0.39mm/px · z∈[-161,+28]mm · 7 of 39 slices shown (2 of 2)]
[im 1/39]
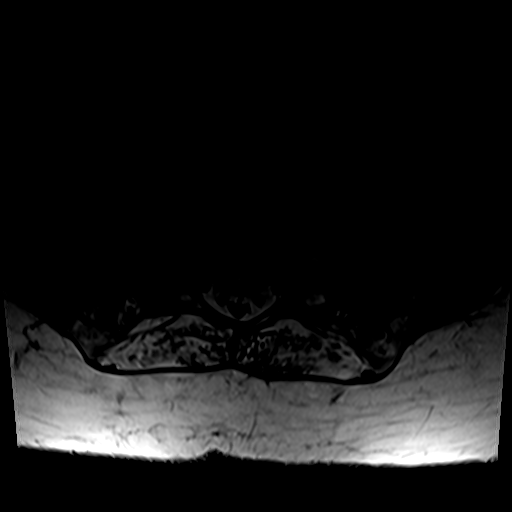
[im 6/39]
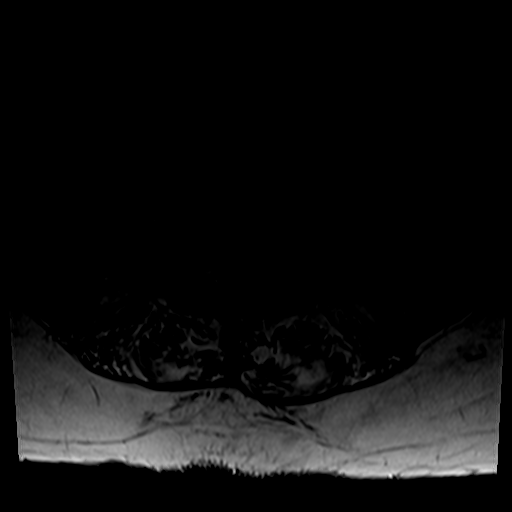
[im 11/39]
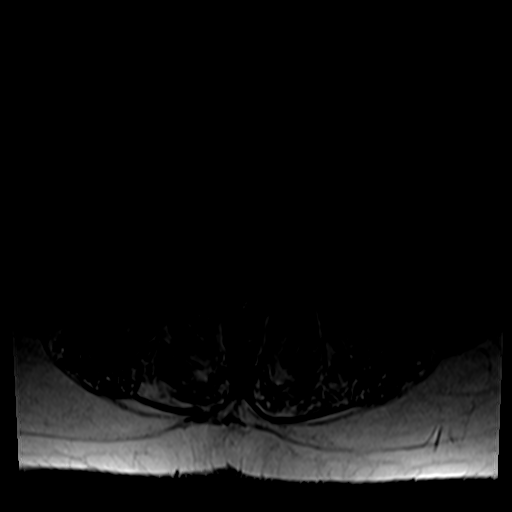
[im 17/39]
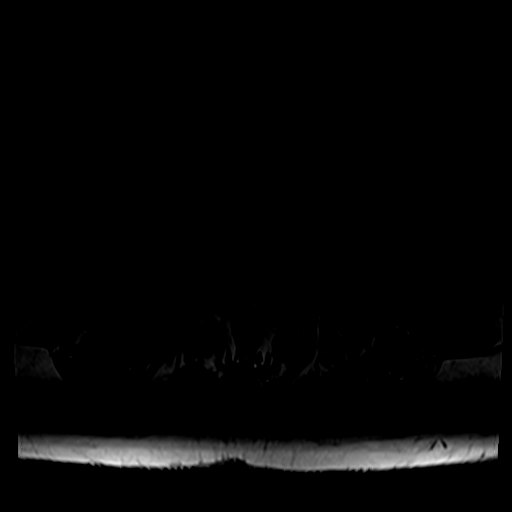
[im 20/39]
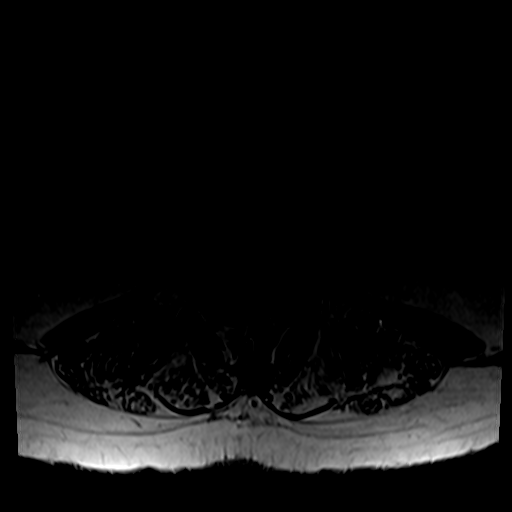
[im 22/39]
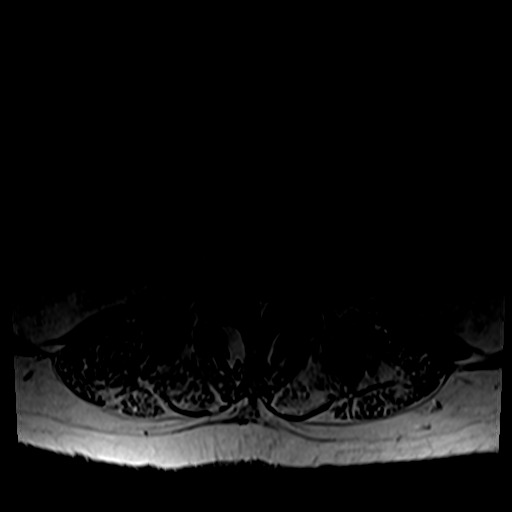
[im 33/39]
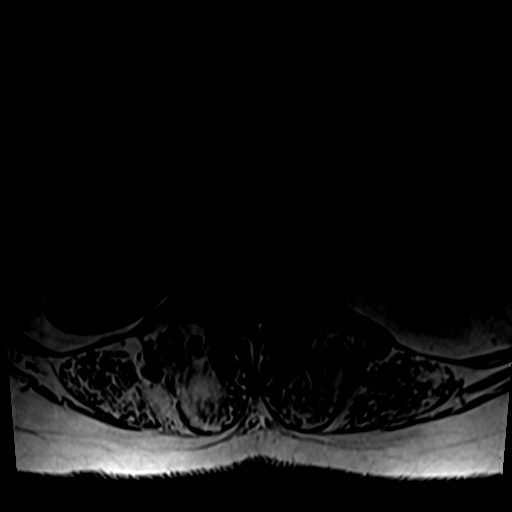

[28 of 48 positions shown; findings below may reference images not displayed]

FINDINGS: Segmentation: In correlation with prior imaging, there are small
ribs at T12 and 5 lumbar type vertebral bodies.

Alignment:  Mild convex left scoliosis.  Normal lateral alignment.

Vertebrae: Interval development of a mild superior endplate
compression fracture at T11. This appears healed without residual
marrow hyperintensity on inversion recovery imaging. No evidence of
acute fracture or pars defect. The visualized sacroiliac joints
appear unremarkable.

Conus medullaris: Extends to the L1 level and appears normal.

Paraspinal and other soft tissues: No significant paraspinal
findings. Renal parapelvic cysts are present bilaterally.

Disc levels:

No significant disc space findings from T11-12 through L1-2.

L2-3: Chronic loss of disc height with annular disc bulging and
endplate osteophytes asymmetric to the right. Mild facet and
ligamentous hypertrophy. Stable mild right lateral recess and right
foraminal narrowing without nerve root encroachment.

L3-4: Chronic loss of disc height with annular disc bulging and a
small disc protrusion in the right subarticular zone. Mild to
moderate facet and ligamentous hypertrophy bilaterally. Resulting
mild spinal stenosis with asymmetric narrowing of the right lateral
recess and possible right L4 nerve root encroachment. There is also
mild left greater than right foraminal narrowing.

L4-5: Preserved disc height. Mild disc bulging, facet and
ligamentous hypertrophy. Mild asymmetric left lateral recess and
left foraminal narrowing without definite nerve root encroachment.

L5-S1: Preserved disc height. Mild disc bulging and facet
hypertrophy. No spinal stenosis or nerve root encroachment.
IMPRESSION: 1. No acute findings or explanation for the patient's symptoms
identified.
2. Chronic spondylosis at L2-3 and L3-4, similar to previous CT. No
high-grade spinal stenosis or clear explanation for left radicular
symptoms identified. There is mild left foraminal narrowing at L3-4.
3. Healed mild superior endplate compression deformity at T11. No
acute osseous findings.

## 2023-05-29 ENCOUNTER — Other Ambulatory Visit: Payer: Self-pay

## 2023-05-29 ENCOUNTER — Telehealth: Payer: Self-pay

## 2023-05-29 ENCOUNTER — Telehealth: Payer: Self-pay | Admitting: Internal Medicine

## 2023-05-29 DIAGNOSIS — M25512 Pain in left shoulder: Secondary | ICD-10-CM | POA: Diagnosis not present

## 2023-05-29 DIAGNOSIS — E0590 Thyrotoxicosis, unspecified without thyrotoxic crisis or storm: Secondary | ICD-10-CM

## 2023-05-29 DIAGNOSIS — Z96612 Presence of left artificial shoulder joint: Secondary | ICD-10-CM | POA: Diagnosis not present

## 2023-05-29 MED ORDER — METHIMAZOLE 5 MG PO TABS
2.5000 mg | ORAL_TABLET | Freq: Every day | ORAL | Status: DC
Start: 2023-05-29 — End: 2023-06-15

## 2023-05-29 NOTE — Telephone Encounter (Signed)
Methimazole refill request complete, sent patient message

## 2023-05-29 NOTE — Telephone Encounter (Signed)
MEDICATION: methimazole methimazole (TAPAZOLE) 5 MG tablet  PHARMACY:   [id number] - [place], [place] - [address] (Ph: [phone])   HAS THE PATIENT CONTACTED THEIR PHARMACY?  Yes  IS THIS A 90 DAY SUPPLY : Yes  IS PATIENT OUT OF MEDICATION: Yes  IF NOT; HOW MUCH IS LEFT:   LAST APPOINTMENT DATE: @12 /12/2021  NEXT APPOINTMENT DATE:@12 /12/2022  DO WE HAVE YOUR PERMISSION TO LEAVE A DETAILED MESSAGE?:Yes  OTHER COMMENTS: Patient states that she has left a few messages and that the pharmacy has left a few messages.  Patient would like a return phone call after medication is sent in.   **Let patient know to contact pharmacy at the end of the day to make sure medication is ready. **  ** Please notify patient to allow 48-72 hours to process**  **Encourage patient to contact the pharmacy for refills or they can request refills through [facility]**

## 2023-05-31 DIAGNOSIS — Z96612 Presence of left artificial shoulder joint: Secondary | ICD-10-CM | POA: Diagnosis not present

## 2023-05-31 DIAGNOSIS — M25512 Pain in left shoulder: Secondary | ICD-10-CM | POA: Diagnosis not present

## 2023-06-07 ENCOUNTER — Telehealth: Payer: Self-pay | Admitting: Internal Medicine

## 2023-06-07 DIAGNOSIS — Z96612 Presence of left artificial shoulder joint: Secondary | ICD-10-CM | POA: Diagnosis not present

## 2023-06-07 DIAGNOSIS — E7800 Pure hypercholesterolemia, unspecified: Secondary | ICD-10-CM

## 2023-06-07 DIAGNOSIS — M25512 Pain in left shoulder: Secondary | ICD-10-CM | POA: Diagnosis not present

## 2023-06-07 DIAGNOSIS — R739 Hyperglycemia, unspecified: Secondary | ICD-10-CM

## 2023-06-07 NOTE — Telephone Encounter (Signed)
Lab orders placed.  

## 2023-06-07 NOTE — Telephone Encounter (Signed)
Patient need lab orders.

## 2023-06-11 ENCOUNTER — Other Ambulatory Visit (INDEPENDENT_AMBULATORY_CARE_PROVIDER_SITE_OTHER): Payer: Medicare Other

## 2023-06-11 DIAGNOSIS — E7800 Pure hypercholesterolemia, unspecified: Secondary | ICD-10-CM | POA: Diagnosis not present

## 2023-06-11 DIAGNOSIS — R739 Hyperglycemia, unspecified: Secondary | ICD-10-CM

## 2023-06-11 LAB — HEPATIC FUNCTION PANEL
ALT: 13 U/L (ref 0–35)
AST: 16 U/L (ref 0–37)
Albumin: 4 g/dL (ref 3.5–5.2)
Alkaline Phosphatase: 118 U/L — ABNORMAL HIGH (ref 39–117)
Bilirubin, Direct: 0.1 mg/dL (ref 0.0–0.3)
Total Bilirubin: 0.5 mg/dL (ref 0.2–1.2)
Total Protein: 6.7 g/dL (ref 6.0–8.3)

## 2023-06-11 LAB — BASIC METABOLIC PANEL
BUN: 18 mg/dL (ref 6–23)
CO2: 27 meq/L (ref 19–32)
Calcium: 9.4 mg/dL (ref 8.4–10.5)
Chloride: 108 meq/L (ref 96–112)
Creatinine, Ser: 0.78 mg/dL (ref 0.40–1.20)
GFR: 76.92 mL/min (ref >60.00)
Glucose, Bld: 101 mg/dL — ABNORMAL HIGH (ref 70–99)
Potassium: 4.3 meq/L (ref 3.5–5.1)
Sodium: 141 meq/L (ref 135–145)

## 2023-06-11 LAB — LIPID PANEL
Cholesterol: 140 mg/dL (ref 0–200)
HDL: 46.9 mg/dL (ref >39.00)
LDL Cholesterol: 67 mg/dL (ref 0–99)
NonHDL: 93.26
Total CHOL/HDL Ratio: 3
Triglycerides: 133 mg/dL (ref 0.0–149.0)
VLDL: 26.6 mg/dL (ref 0.0–40.0)

## 2023-06-11 LAB — HEMOGLOBIN A1C: Hgb A1c MFr Bld: 5.8 % (ref 4.6–6.5)

## 2023-06-12 DIAGNOSIS — M25512 Pain in left shoulder: Secondary | ICD-10-CM | POA: Diagnosis not present

## 2023-06-12 DIAGNOSIS — Z96612 Presence of left artificial shoulder joint: Secondary | ICD-10-CM | POA: Diagnosis not present

## 2023-06-14 ENCOUNTER — Ambulatory Visit: Payer: Medicare Other

## 2023-06-14 ENCOUNTER — Ambulatory Visit: Payer: Medicare Other | Admitting: Internal Medicine

## 2023-06-14 ENCOUNTER — Encounter: Payer: Self-pay | Admitting: Internal Medicine

## 2023-06-14 VITALS — BP 128/70 | HR 86 | Temp 98.2°F | Resp 16 | Ht 61.0 in | Wt 165.0 lb

## 2023-06-14 DIAGNOSIS — E0590 Thyrotoxicosis, unspecified without thyrotoxic crisis or storm: Secondary | ICD-10-CM | POA: Diagnosis not present

## 2023-06-14 DIAGNOSIS — F439 Reaction to severe stress, unspecified: Secondary | ICD-10-CM

## 2023-06-14 DIAGNOSIS — R739 Hyperglycemia, unspecified: Secondary | ICD-10-CM

## 2023-06-14 DIAGNOSIS — R0600 Dyspnea, unspecified: Secondary | ICD-10-CM

## 2023-06-14 DIAGNOSIS — E7800 Pure hypercholesterolemia, unspecified: Secondary | ICD-10-CM | POA: Diagnosis not present

## 2023-06-14 DIAGNOSIS — R0609 Other forms of dyspnea: Secondary | ICD-10-CM | POA: Diagnosis not present

## 2023-06-14 DIAGNOSIS — I420 Dilated cardiomyopathy: Secondary | ICD-10-CM | POA: Diagnosis not present

## 2023-06-14 DIAGNOSIS — Z860100 Personal history of colon polyps, unspecified: Secondary | ICD-10-CM

## 2023-06-14 DIAGNOSIS — M79671 Pain in right foot: Secondary | ICD-10-CM | POA: Diagnosis not present

## 2023-06-14 DIAGNOSIS — K219 Gastro-esophageal reflux disease without esophagitis: Secondary | ICD-10-CM | POA: Diagnosis not present

## 2023-06-14 DIAGNOSIS — R0602 Shortness of breath: Secondary | ICD-10-CM | POA: Diagnosis not present

## 2023-06-14 DIAGNOSIS — I471 Supraventricular tachycardia, unspecified: Secondary | ICD-10-CM | POA: Diagnosis not present

## 2023-06-14 DIAGNOSIS — Z23 Encounter for immunization: Secondary | ICD-10-CM | POA: Diagnosis not present

## 2023-06-14 DIAGNOSIS — I5022 Chronic systolic (congestive) heart failure: Secondary | ICD-10-CM

## 2023-06-14 NOTE — Progress Notes (Signed)
Subjective:    [name], female    DOB: Nov 05, 1950, 72 y.o.   MRN: [id number]  Patient here for  Chief Complaint  Patient presents with   Medical Management of Chronic Issues    HPI Here for a scheduled follow up - follow up regarding afib, CHF, CM, hypercholesterolemia and hypertension.  S/p recent shoulder surgery.  Has been going to PT. Completed PT.  Shoulder is doing well.  She reports increased sob with exertion.  Was actually noticing this some prior to her surgery.  Unable to walk up inclines without noticing some increased sob. Some chest discomfort noticed at times with increased sob.  No significant increased cough or congestion.  Some allergy symptoms at times.  Did have a couple of episodes last week - emesis.  Some nausea.  No nausea or vomiting now.  Appetite normal.  No bowel change.  Previously - left side pain.  Woke her from sleep.  Last a couple of days and resolved.  No pain now.  Has had right foot issues/pain.  Saw podiatry previously. Was placed on steroids.  When off foot after surgery - no pain.  Now that she is starting to get more active, she has noticed some increased discomfort.     Past Medical History:  Diagnosis Date   Allergy    Anemia    Arthritis    Atrial fibrillation (HCC)    Cardiomyopathy, nonischemic (HCC)    a.) felt to be tachycardia mediated NICM; b.) TTE 09/01/2015: EF 35-40%; c.) [facility] 11/26/2015: ED 30-35%, norm cors, mPA 16, PCWP 7, CO 4.7, CI 2.8; d.) TTE 01/31/2016: EF 40-45%; e.) cMRI 04/27/2016: EF 48%; f.) TTE 12/06/2016: EF 55-60%; g.) TTE 03/25/2018: EF 50-55%   Carpal tunnel syndrome, bilateral    Chronic back pain    Congestive heart failure (HCC)    a.) TTE 09/01/2015: EF 35-40%, mod MR; b.) R/LHC 11/26/2015: EF 30-35%; c.) TTE 01/31/2016: EF 40-45%, mild LVH, diffuse HK, triv MR/TR; d.) TTE 12/06/2016: EF 55-60%, mild MAC, G1DD; e.) TTE 03/25/2018: EF 50-55%, triv PR, G2DD.   Diverticulosis    DOE (dyspnea  on exertion)    Dyspnea on exertion    Dysrhythmia    Environmental and seasonal allergies    Female cystocele    GERD (gastroesophageal reflux disease)    Hiatal hernia    History of complications due to general anesthesia    a.) PONV; b.) delayed emergence   Hyperlipidemia    Hypertension    Hyperthyroidism    Migraines    Nontraumatic complete tear of left rotator cuff    NSVT (nonsustained ventricular tachycardia) (HCC) 11/16/2015   a.) felt to be related to NICM in the setting of increased stressors, influenza A, hypotension Tx'd  with IVF bolus   Overactive detrusor    Personal history of colonic polyps    PONV (postoperative nausea and vomiting)    Septic shock (HCC) 11/14/2015   a.) admitted 11/14/2015 - 11/18/2015 for urosepsis + influenza + viral gastroenteritis (+ N/V/D). SBP unpon arrival was in the 60s --> improved to 80s after 4L IVFs --> started on pressors and admitted to ICU.   Skin cancer, basal cell    Squamous cell skin cancer    SUI (stress urinary incontinence, female)    SVT (supraventricular tachycardia) (HCC) 08/31/2015   a.) rates as high as 150 bpm; b.) EP feels as if event was "long RP tachycardia"   Tendinitis of left rotator  cuff    Uterovaginal prolapse    Vaginal atrophy    Past Surgical History:  Procedure Laterality Date   ANTERIOR AND POSTERIOR VAGINAL REPAIR W/ SACROSPINOUS LIGAMENT SUSPENSION  10/18/2012   CARDIAC CATHETERIZATION N/A 11/26/2015   Procedure: Right/Left Heart Cath and Coronary Angiography;  Surgeon: Dolores Patty, MD;  Location: [facility] INVASIVE CV LAB;  Service: Cardiovascular;  Laterality: N/A;   CARPAL TUNNEL RELEASE Right    CATARACT EXTRACTION Bilateral    CHOLECYSTECTOMY  2006   COLONOSCOPY WITH PROPOFOL N/A 03/19/2017   Procedure: COLONOSCOPY WITH PROPOFOL;  Surgeon: Christena Deem, MD;  Location: [facility] ENDOSCOPY;  Service: Endoscopy;  Laterality: N/A;   COLPOPEXY  10/18/2012   CYSTOSCOPY WITH BIOPSY N/A 04/21/2022    Procedure: CYSTOSCOPY WITH BLADDER BIOPSY;  Surgeon: Sondra Come, MD;  Location: [facility] ORS;  Service: Urology;  Laterality: N/A;   CYSTOURETHROSCOPY  10/18/2012   ESOPHAGOGASTRODUODENOSCOPY (EGD) WITH PROPOFOL N/A 10/03/2016   Procedure: ESOPHAGOGASTRODUODENOSCOPY (EGD) WITH PROPOFOL;  Surgeon: Christena Deem, MD;  Location: [facility] ENDOSCOPY;  Service: Endoscopy;  Laterality: N/A;   HYSTEROSCOPY  2011, 10/2010   OOPHORECTOMY     REVERSE SHOULDER ARTHROPLASTY Right 06/17/2019   Procedure: REVERSE SHOULDER ARTHROPLASTY;  Surgeon: Christena Flake, MD;  Location: [facility] ORS;  Service: Orthopedics;  Laterality: Right;   REVERSE SHOULDER ARTHROPLASTY Left 03/13/2023   Procedure: REVERSE SHOULDER ARTHROPLASTY;  Surgeon: Christena Flake, MD;  Location: [facility] ORS;  Service: Orthopedics;  Laterality: Left;   ROBOTIC ASSISTED LAPAROSCOPIC SACROCOLPOPEXY N/A 06/21/2022   Procedure: XI ROBOTIC ASSISTED LAPAROSCOPIC SACROCOLPOPEXY;  Surgeon: Crist Fat, MD;  Location: [facility] ORS;  Service: Urology;  Laterality: N/A;  240 MINUTES   TONSILLECTOMY     TOTAL SHOULDER REPLACEMENT Right    TRIGGER FINGER RELEASE Bilateral    TUBAL LIGATION     VAGINAL HYSTERECTOMY     midurethral sling   Family History  Problem Relation Age of Onset   Hypertension Mother    Stroke Mother    Diabetes Mother    Heart disease Father    Hypercholesterolemia Father    Hypertension Father    Stroke Father    Heart attack Father    Breast cancer Neg Hx    Colon cancer Neg Hx    Thyroid disease Neg Hx    Social History   Socioeconomic History   Marital status: Widowed    Spouse name: Not on file   Number of children: 2   Years of education: Not on file   Highest education level: Not on file  Occupational History   Not on file  Tobacco Use   Smoking status: Never    Passive exposure: Never   Smokeless tobacco: Never  Vaping Use   Vaping status: Never Used  Substance and Sexual Activity   Alcohol use: Not  Currently   Drug use: Never   Sexual activity: Yes    Birth control/protection: Post-menopausal  Other Topics Concern   Not on file  Social History Narrative   Lives alone   Social Determinants of Health   Financial Resource Strain: Low Risk  (11/20/2022)   Overall Financial Resource Strain (CARDIA)    Difficulty of Paying Living Expenses: Not hard at all  Food Insecurity: No Food Insecurity (11/20/2022)   Hunger Vital Sign    Worried About Running Out of Food in the Last Year: Never true    Ran Out of Food in the Last Year: Never true  Transportation Needs: No  Transportation Needs (11/20/2022)   PRAPARE - Administrator, Civil Service (Medical): No    Lack of Transportation (Non-Medical): No  Physical Activity: Insufficiently Active (11/20/2022)   Exercise Vital Sign    Days of Exercise per Week: 2 days    Minutes of Exercise per Session: 70 min  Stress: No Stress Concern Present (11/20/2022)   [employer] of Occupational Health - Occupational Stress Questionnaire    Feeling of Stress : Not at all  Social Connections: Moderately Integrated (11/20/2022)   Social Connection and Isolation Panel [NHANES]    Frequency of Communication with Friends and Family: More than three times a week    Frequency of Social Gatherings with Friends and Family: More than three times a week    Attends Religious Services: More than 4 times per year    Active Member of [employer] or Organizations: Yes    Attends Banker Meetings: Not on file    Marital Status: Widowed     Review of Systems  Constitutional:  Negative for appetite change and unexpected weight change.  HENT:  Negative for congestion and sinus pressure.   Respiratory:  Positive for shortness of breath. Negative for cough and chest tightness.   Cardiovascular:  Positive for chest pain. Negative for palpitations and leg swelling.  Gastrointestinal:  Negative for constipation and diarrhea.       No nausea or  vomiting now.  No side pain now.   Genitourinary:  Negative for difficulty urinating and dysuria.  Musculoskeletal:  Negative for joint swelling and myalgias.       Right foot pain as outlined.   Skin:  Negative for color change and rash.  Neurological:  Negative for dizziness and headaches.  Psychiatric/Behavioral:  Negative for agitation and dysphoric mood.        Objective:     BP 128/70   Pulse 86   Temp 98.2 F (36.8 C)   Resp 16   Ht 5\' 1"  (1.549 m)   Wt 165 lb (74.8 kg)   LMP 10/18/2012   SpO2 98%   BMI 31.18 kg/m  Wt Readings from Last 3 Encounters:  06/14/23 165 lb (74.8 kg)  03/13/23 160 lb (72.6 kg)  03/06/23 166 lb 10.7 oz (75.6 kg)    Physical Exam Vitals reviewed.  Constitutional:      General: She is not in acute distress.    Appearance: Normal appearance.  HENT:     Head: Normocephalic and atraumatic.     Right Ear: External ear normal.     Left Ear: External ear normal.  Eyes:     General: No scleral icterus.       Right eye: No discharge.        Left eye: No discharge.     Conjunctiva/sclera: Conjunctivae normal.  Neck:     Thyroid: No thyromegaly.  Cardiovascular:     Rate and Rhythm: Normal rate and regular rhythm.  Pulmonary:     Effort: No respiratory distress.     Breath sounds: Normal breath sounds. No wheezing.  Abdominal:     General: Bowel sounds are normal.     Palpations: Abdomen is soft.     Tenderness: There is no abdominal tenderness.  Musculoskeletal:        General: No swelling or tenderness.     Cervical back: Neck supple. No tenderness.  Lymphadenopathy:     Cervical: No cervical adenopathy.  Skin:    Findings: No erythema or rash.  Neurological:     Mental Status: She is alert.  Psychiatric:        Mood and Affect: Mood normal.        Behavior: Behavior normal.      Outpatient Encounter Medications as of 06/14/2023  Medication Sig   acetaminophen (TYLENOL) 500 MG tablet Take 500-1,000 mg by mouth every 6  (six) hours as needed for moderate pain or headache.   Calcium Carb-Cholecalciferol (CALCIUM-VITAMIN D3 PO) Take 1 tablet by mouth daily. 600 mg  20 mcg   calcium carbonate (TUMS - DOSED IN MG ELEMENTAL CALCIUM) 500 MG chewable tablet Chew 1 tablet by mouth daily as needed for indigestion or heartburn.   carvedilol (COREG) 12.5 MG tablet TAKE 1 TABLET (12.5MG  TOTAL) BY MOUTH TWICE A DAY WITH MEALS   Cholecalciferol (D3-1000) 25 MCG (1000 UT) capsule Take 1,000 Units by mouth daily.   docusate sodium (COLACE) 100 MG capsule Take 1 capsule (100 mg total) by mouth daily as needed.   estradiol (ESTRACE VAGINAL) 0.1 MG/GM vaginal cream Place 1 Applicatorful vaginally 3 (three) times a week. Use 1 small dolyp of cream on tip of index finger and swap the inside of the vagina (Patient taking differently: Place 1 Applicatorful vaginally as needed. Use 1 small dolyp of cream on tip of index finger and swap the inside of the vagina)   fexofenadine (ALLEGRA) 180 MG tablet Take 180 mg by mouth at bedtime.    furosemide (LASIX) 20 MG tablet Take 1 tablet (20 mg total) by mouth daily as needed (only when home).   meloxicam (MOBIC) 15 MG tablet TAKE 1 TABLET (15 MG TOTAL) BY MOUTH DAILY.   rosuvastatin (CRESTOR) 10 MG tablet TAKE 1 TABLET BY MOUTH EVERY DAY   SUMAtriptan (IMITREX) 100 MG tablet TAKE 1 TABLET BY MOUTH ONCE. MAY REPEAT IN 2 HOURS IF HEADACHE PERSISTS OR RECURS.   triamcinolone (NASACORT) 55 MCG/ACT AERO nasal inhaler Place 1 spray into the nose daily as needed (allergies).   zinc gluconate 50 MG tablet Take 50 mg by mouth daily.   zonisamide (ZONEGRAN) 100 MG capsule TAKE 1 CAPSULE BY MOUTH EVERY DAY   [DISCONTINUED] methimazole (TAPAZOLE) 5 MG tablet Take 0.5 tablets (2.5 mg total) by mouth daily.   [DISCONTINUED] ondansetron (ZOFRAN-ODT) 4 MG disintegrating tablet Take 1 tablet (4 mg total) by mouth every 6 (six) hours as needed for nausea or vomiting.   [DISCONTINUED] oxyCODONE (ROXICODONE) 5 MG  immediate release tablet Take 1-2 tablets (5-10 mg total) by mouth every 4 (four) hours as needed for moderate pain or severe pain.   No facility-administered encounter medications on file as of 06/14/2023.     Lab Results  Component Value Date   WBC 7.6 03/05/2023   HGB 13.3 03/05/2023   HCT 41.4 03/05/2023   PLT 208.0 03/05/2023   GLUCOSE 101 (H) 06/11/2023   CHOL 140 06/11/2023   TRIG 133.0 06/11/2023   HDL 46.90 06/11/2023   LDLCALC 67 06/11/2023   ALT 13 06/11/2023   AST 16 06/11/2023   NA 141 06/11/2023   K 4.3 06/11/2023   CL 108 06/11/2023   CREATININE 0.78 06/11/2023   BUN 18 06/11/2023   CO2 27 06/11/2023   TSH 0.84 11/08/2022   INR 0.98 11/26/2015   HGBA1C 5.8 06/11/2023    DG Shoulder Left Port  Result Date: 03/13/2023 CLINICAL DATA:  Status post reverse arthroplasty of shoulder, left EXAM: LEFT SHOULDER COMPARISON:  None Available. FINDINGS: Status post left reverse shoulder arthroplasty.  No definite evidence of acute fracture or dislocation on limited positioning. Expected postoperative swelling. Alone IMPRESSION: Postoperative changes of left reverse shoulder arthroplasty without evidence of immediate hardware complication. Electronically Signed   By: Feliberto Harts M.D.   On: 03/13/2023 14:15   [place] OR NERVE BLOCK-IMAGE ONLY [facility])  Result Date: 03/13/2023 There is no interpretation for this exam.  This order is for images obtained during a surgical procedure.  Please See "Surgeries" Tab for more information regarding the procedure.       Assessment & Plan:  Dyspnea, unspecified type -     EKG 12-Lead -     DG Chest 2 View; Future  Encounter for administration of vaccine -     Flu Vaccine Trivalent High Dose (Fluad)  SVT (supraventricular tachycardia) (HCC) Assessment & Plan: On coreg.  Stable.    Stress Assessment & Plan: Doing well.  Feels good.  Has good family support. Follow.    Hyperthyroidism Assessment & Plan: Thyrotoxicosis - Dr  Wyonia Hough (08/02/22) - Thyroid tests remain controlled. However, free T3 is at the upper limit of the target range, so for now I would suggest to continue the low-dose of methimazole. We will plan to repeat her TFTs in 3 months. Repeat TFTs - wnl.  Recommended continuing methimazoloe.  F/u 07/2023.    Hyperglycemia Assessment & Plan: Low carb diet and exercise.  Follow met b and a1c.    Hypercholesterolemia Assessment & Plan: On crestor.  Low cholesterol diet and exercise.  Follow lipid panel and liver function tests.     History of colonic polyps Assessment & Plan: Colonoscopy 02/2017.  Recommended f/u in 5 years.  Had f/u colonoscopy 02/21/21.    Gastroesophageal reflux disease, unspecified whether esophagitis present Assessment & Plan: Upper symptoms controlled on protonix.     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HCC) Assessment & Plan: No evidence of volume overload.  On coreg and lasix prn.  Follow. Follow metabolic panel.    Chronic systolic heart failure (HCC) Assessment & Plan: Continue coreg and lasix prn. No evidence of volume overload.  Follow.    DOE (dyspnea on exertion) Assessment & Plan: Describes DOE with walking - especially with inclines.  Some associated DOE with walking up inclines.  Some chest discomfort noticed at times with increased sob. EKG - SR with no acute ischemic changes.  Discussed need for f/u with cardiology for further evaluation.  Further w/up pending cardiology assessment.     Right foot pain Assessment & Plan: Saw podiatry previously. Was placed on steroids.  When off foot after surgery - no pain.  Now that she is starting to get more active, she has noticed some increased discomfort. Stretches. Supports.  Notify if desires further evaluation.       Dale Massena, MD

## 2023-06-15 MED ORDER — METHIMAZOLE 5 MG PO TABS
2.5000 mg | ORAL_TABLET | Freq: Every day | ORAL | 3 refills | Status: AC
Start: 2023-06-15 — End: ?

## 2023-06-17 ENCOUNTER — Encounter: Payer: Self-pay | Admitting: Internal Medicine

## 2023-06-17 ENCOUNTER — Telehealth: Payer: Self-pay | Admitting: Internal Medicine

## 2023-06-17 NOTE — Assessment & Plan Note (Signed)
On crestor.  Low cholesterol diet and exercise.  Follow lipid panel and liver function tests.

## 2023-06-17 NOTE — Assessment & Plan Note (Signed)
No evidence of volume overload.  On coreg and lasix prn.  Follow. Follow metabolic panel.

## 2023-06-17 NOTE — Assessment & Plan Note (Signed)
Continue coreg and lasix prn. No evidence of volume overload.  Follow.

## 2023-06-17 NOTE — Assessment & Plan Note (Signed)
Low carb diet and exercise.  Follow met b and a1c.  

## 2023-06-17 NOTE — Assessment & Plan Note (Signed)
Doing well.  Feels good.  Has good family support. Follow.  

## 2023-06-17 NOTE — Assessment & Plan Note (Signed)
Upper symptoms controlled on protonix.  

## 2023-06-17 NOTE — Assessment & Plan Note (Signed)
Describes DOE with walking - especially with inclines.  Some associated DOE with walking up inclines.  Some chest discomfort noticed at times with increased sob. EKG - SR with no acute ischemic changes.  Discussed need for f/u with cardiology for further evaluation.  Further w/up pending cardiology assessment.

## 2023-06-17 NOTE — Assessment & Plan Note (Signed)
Thyrotoxicosis - Dr Wyonia Hough (08/02/22) - Thyroid tests remain controlled. However, free T3 is at the upper limit of the target range, so for now I would suggest to continue the low-dose of methimazole. We will plan to repeat her TFTs in 3 months. Repeat TFTs - wnl.  Recommended continuing methimazoloe.  F/u 07/2023.

## 2023-06-17 NOTE — Assessment & Plan Note (Signed)
On coreg.  Stable.

## 2023-06-17 NOTE — Assessment & Plan Note (Signed)
Colonoscopy 02/2017.  Recommended f/u in 5 years.  Had f/u colonoscopy 02/21/21.  

## 2023-06-17 NOTE — Assessment & Plan Note (Signed)
Saw podiatry previously. Was placed on steroids.  When off foot after surgery - no pain.  Now that she is starting to get more active, she has noticed some increased discomfort. Stretches. Supports.  Notify if desires further evaluation.

## 2023-06-17 NOTE — Telephone Encounter (Signed)
Was evaluated recently.  Has seen cardiology (Dr Mariah Milling).  Has known heart issues.  Has noticed some increased sob with exertion and some intermittent chest pain associated with sob.  Needs a f/u appt with cardiology/Dr Mariah Milling or assistant for further evaluation.  She is already seeing him, so should not need a new referral.

## 2023-06-20 NOTE — Telephone Encounter (Signed)
Called and advised pt to call cardiology and she said that she would call them today to get an appointment made.

## 2023-07-27 ENCOUNTER — Other Ambulatory Visit: Payer: Self-pay | Admitting: Internal Medicine

## 2023-07-31 ENCOUNTER — Encounter: Payer: Self-pay | Admitting: Cardiovascular Disease

## 2023-07-31 ENCOUNTER — Ambulatory Visit: Payer: Medicare Other | Attending: Cardiovascular Disease | Admitting: Cardiovascular Disease

## 2023-07-31 VITALS — BP 100/60 | HR 66 | Ht 61.0 in | Wt 166.0 lb

## 2023-07-31 DIAGNOSIS — I5022 Chronic systolic (congestive) heart failure: Secondary | ICD-10-CM | POA: Insufficient documentation

## 2023-07-31 DIAGNOSIS — I428 Other cardiomyopathies: Secondary | ICD-10-CM | POA: Diagnosis not present

## 2023-07-31 DIAGNOSIS — I471 Supraventricular tachycardia, unspecified: Secondary | ICD-10-CM | POA: Diagnosis not present

## 2023-07-31 MED ORDER — CARVEDILOL 12.5 MG PO TABS: 12.5000 mg | ORAL_TABLET | Freq: Two times a day (BID) | ORAL | 3 refills | Status: DC

## 2023-07-31 NOTE — Progress Notes (Signed)
Cardiology Office Note  Date:  07/31/2023   ID:  Kristi, Greer August 31, 1950, MRN [id number]  PCP:  Dale Marseilles, MD   Chief Complaint  Patient presents with   12 month follow up    Patient c/o shortness of breath with walking a short distance. Medications reviewed by the patient verbally.     HPI:  Kristi Greer is a 72 y/o woman with h/o obesity,  migraines  HTN  Nonischemic cardiomyopathy 08/2015 EF was 35 to 40% felt to be tachycardia mediated, Nonsustained VT Recovery and cardiac function, ejection fraction 55 to 60% April 2018 and 2019 Nonobstructive coronary disease on catheterization 10/2015 Who presents for follow-up of her cardiomyopathy  Last seen by myself in clinic October 2023 Has had several surgeries since her last clinic visit prolapse bladder, surgery in 2023 Left reverse shoulder surgery June 2024 Sedentary, stopped going to the gym last year  Reported having worsening shortness of breath Started going back to the gym with a friend from church Tries to go daily over the past 3 weeks Uses the Treadmill 30 min and bike 30 min Denies significant lower extremity swelling, no abdominal distention, no significant change in weight concerning for fluid retention  Denies chest pain concerning for angina  Labs reviewed Total chol 140, LDL 67 A1C 5.8   husband with cancer, stage 4 Melanoma, passed 08/2020  Echo 2019 Normal EF 50 to 55% moderate diastolic dysfunction  Recent EKGs reviewed showing normal sinus rhythm  Other past medical history reviewed with her on today's visit January 2017 admitted at [facility] with acute CHF and SVT (No palpitations at the time) Received adenosine but didn't break.  converted spontaneously.  Prior history of hyperthyroidism.  Treated with tapazole and a b-blocker.    Echo 4/18 EF 55-60% 30-d monitor 5/19:   - Normal sinus rhythm    - One 13-beat run NSVT on 5/12   - No other high-grade arrhythmias or blocks.      30-d monitor brief nonsustained VT for which she was asymptomatic. She has seen Dr. Johney Frame. They discussed possible EPS but with normal EF have opted for medical therapy with carvedilol unless EF drops or has recurrent arrhythmias or worsening symptoms  cMRI 03/2016 LVEF 48% RV normal. No evidence of infiltrative disease.    On 10/11/15 had Myoview EF 30% No ischemia or scar.  Event monitor 4/17: NSR one 7 beat NSVT. No SVT   Echo 01/31/16 LVEF 40-45%, Grade 1 DD, Normal RV   Previous 30-day monitor in May 2019 One 13-beat run NSVT on 5/12   PMH:   has a past medical history of Allergy, Anemia, Arthritis, Atrial fibrillation (HCC), Cardiomyopathy, nonischemic (HCC), Carpal tunnel syndrome, bilateral, Chronic back pain, Congestive heart failure (HCC), Diverticulosis, DOE (dyspnea on exertion), Dyspnea on exertion, Dysrhythmia, Environmental and seasonal allergies, Female cystocele, GERD (gastroesophageal reflux disease), Hiatal hernia, History of complications due to general anesthesia, Hyperlipidemia, Hypertension, Hyperthyroidism, Migraines, Nontraumatic complete tear of left rotator cuff, NSVT (nonsustained ventricular tachycardia) (HCC) (11/16/2015), Overactive detrusor, Personal history of colonic polyps, PONV (postoperative nausea and vomiting), Septic shock (HCC) (11/14/2015), Skin cancer, basal cell, Squamous cell skin cancer, SUI (stress urinary incontinence, female), SVT (supraventricular tachycardia) (HCC) (08/31/2015), Tendinitis of left rotator cuff, Uterovaginal prolapse, and Vaginal atrophy.  PSH:    Past Surgical History:  Procedure Laterality Date   ANTERIOR AND POSTERIOR VAGINAL REPAIR W/ SACROSPINOUS LIGAMENT SUSPENSION  10/18/2012   CARDIAC CATHETERIZATION N/A 11/26/2015   Procedure: Right/Left Heart Cath and  Coronary Angiography;  Surgeon: Dolores Patty, MD;  Location: [facility] INVASIVE CV LAB;  Service: Cardiovascular;  Laterality: N/A;   CARPAL TUNNEL RELEASE Right    CATARACT  EXTRACTION Bilateral    CHOLECYSTECTOMY  2006   COLONOSCOPY WITH PROPOFOL N/A 03/19/2017   Procedure: COLONOSCOPY WITH PROPOFOL;  Surgeon: Christena Deem, MD;  Location: [facility] ENDOSCOPY;  Service: Endoscopy;  Laterality: N/A;   COLPOPEXY  10/18/2012   CYSTOSCOPY WITH BIOPSY N/A 04/21/2022   Procedure: CYSTOSCOPY WITH BLADDER BIOPSY;  Surgeon: Sondra Come, MD;  Location: [facility] ORS;  Service: Urology;  Laterality: N/A;   CYSTOURETHROSCOPY  10/18/2012   ESOPHAGOGASTRODUODENOSCOPY (EGD) WITH PROPOFOL N/A 10/03/2016   Procedure: ESOPHAGOGASTRODUODENOSCOPY (EGD) WITH PROPOFOL;  Surgeon: Christena Deem, MD;  Location: [facility] ENDOSCOPY;  Service: Endoscopy;  Laterality: N/A;   HYSTEROSCOPY  2011, 10/2010   OOPHORECTOMY     REVERSE SHOULDER ARTHROPLASTY Right 06/17/2019   Procedure: REVERSE SHOULDER ARTHROPLASTY;  Surgeon: Christena Flake, MD;  Location: [facility] ORS;  Service: Orthopedics;  Laterality: Right;   REVERSE SHOULDER ARTHROPLASTY Left 03/13/2023   Procedure: REVERSE SHOULDER ARTHROPLASTY;  Surgeon: Christena Flake, MD;  Location: [facility] ORS;  Service: Orthopedics;  Laterality: Left;   ROBOTIC ASSISTED LAPAROSCOPIC SACROCOLPOPEXY N/A 06/21/2022   Procedure: XI ROBOTIC ASSISTED LAPAROSCOPIC SACROCOLPOPEXY;  Surgeon: Crist Fat, MD;  Location: [facility] ORS;  Service: Urology;  Laterality: N/A;  240 MINUTES   TONSILLECTOMY     TOTAL SHOULDER REPLACEMENT Right    TRIGGER FINGER RELEASE Bilateral    TUBAL LIGATION     VAGINAL HYSTERECTOMY     midurethral sling    Current Outpatient Medications  Medication Sig Dispense Refill   acetaminophen (TYLENOL) 500 MG tablet Take 500-1,000 mg by mouth every 6 (six) hours as needed for moderate pain or headache.     Calcium Carb-Cholecalciferol (CALCIUM-VITAMIN D3 PO) Take 1 tablet by mouth daily. 600 mg  20 mcg     calcium carbonate (TUMS - DOSED IN MG ELEMENTAL CALCIUM) 500 MG chewable tablet Chew 1 tablet by mouth daily as needed for indigestion or  heartburn.     carvedilol (COREG) 12.5 MG tablet TAKE 1 TABLET (12.5MG  TOTAL) BY MOUTH TWICE A DAY WITH MEALS 180 tablet 3   Cholecalciferol (D3-1000) 25 MCG (1000 UT) capsule Take 1,000 Units by mouth daily.     docusate sodium (COLACE) 100 MG capsule Take 1 capsule (100 mg total) by mouth daily as needed.     estradiol (ESTRACE VAGINAL) 0.1 MG/GM vaginal cream Place 1 Applicatorful vaginally 3 (three) times a week. Use 1 small dolyp of cream on tip of index finger and swap the inside of the vagina (Patient taking differently: Place 1 Applicatorful vaginally as needed. Use 1 small dolyp of cream on tip of index finger and swap the inside of the vagina) 42.5 g 1   fexofenadine (ALLEGRA) 180 MG tablet Take 180 mg by mouth at bedtime.      furosemide (LASIX) 20 MG tablet Take 1 tablet (20 mg total) by mouth daily as needed (only when home).     meloxicam (MOBIC) 15 MG tablet TAKE 1 TABLET (15 MG TOTAL) BY MOUTH DAILY. 30 tablet 1   methimazole (TAPAZOLE) 5 MG tablet Take 0.5 tablets (2.5 mg total) by mouth daily. 90 tablet 3   rosuvastatin (CRESTOR) 10 MG tablet TAKE 1 TABLET BY MOUTH EVERY DAY 90 tablet 3   SUMAtriptan (IMITREX) 100 MG tablet TAKE 1 TABLET BY MOUTH ONCE. MAY REPEAT  IN 2 HOURS IF HEADACHE PERSISTS OR RECURS. 9 tablet 0   triamcinolone (NASACORT) 55 MCG/ACT AERO nasal inhaler Place 1 spray into the nose daily as needed (allergies).     zinc gluconate 50 MG tablet Take 50 mg by mouth daily.     zonisamide (ZONEGRAN) 100 MG capsule TAKE 1 CAPSULE BY MOUTH EVERY DAY 90 capsule 0   No current facility-administered medications for this visit.    Allergies:   Patient has no known allergies.   Social History:  The patient  reports that she has never smoked. She has never been exposed to tobacco smoke. She has never used smokeless tobacco. She reports that she does not currently use alcohol. She reports that she does not use drugs.   Family History:   family history includes Diabetes in  her mother; Heart attack in her father; Heart disease in her father; Hypercholesterolemia in her father; Hypertension in her father and mother; Stroke in her father and mother.    Review of Systems: Review of Systems  Constitutional: Negative.   HENT: Negative.    Respiratory:  Positive for shortness of breath.   Cardiovascular: Negative.   Gastrointestinal: Negative.   Musculoskeletal: Negative.   Neurological: Negative.   Psychiatric/Behavioral: Negative.    All other systems reviewed and are negative.   PHYSICAL EXAM: VS:  BP 100/60 (BP Location: Left Arm, Patient Position: Sitting, Cuff Size: Normal)   Pulse 66   Ht 5\' 1"  (1.549 m)   Wt 166 lb (75.3 kg)   LMP 10/18/2012   SpO2 98%   BMI 31.37 kg/m  , BMI Body mass index is 31.37 kg/m. Constitutional:  oriented to person, place, and time. No distress.  HENT:  Head: Grossly normal Eyes:  no discharge. No scleral icterus.  Neck: No JVD, no carotid bruits  Cardiovascular: Regular rate and rhythm, no murmurs appreciated Pulmonary/Chest: Clear to auscultation bilaterally, no wheezes or rails Abdominal: Soft.  no distension.  no tenderness.  Musculoskeletal: Normal range of motion Neurological:  normal muscle tone. Coordination normal. No atrophy Skin: Skin warm and dry Psychiatric: normal affect, pleasant  Recent Labs: 11/08/2022: TSH 0.84 03/05/2023: Hemoglobin 13.3; Platelets 208.0 06/11/2023: ALT 13; BUN 18; Creatinine, Ser 0.78; Potassium 4.3; Sodium 141    Lipid Panel Lab Results  Component Value Date   CHOL 140 06/11/2023   HDL 46.90 06/11/2023   LDLCALC 67 06/11/2023   TRIG 133.0 06/11/2023      Wt Readings from Last 3 Encounters:  07/31/23 166 lb (75.3 kg)  06/14/23 165 lb (74.8 kg)  03/13/23 160 lb (72.6 kg)     ASSESSMENT AND PLAN:  NSVT (nonsustained ventricular tachycardia) (HCC) Tolerating carvedilol 12.5 twice daily Prior telemetry monitor 2019 with rare episode of nonsustained VT Denies  symptoms of tachycardia, no near-syncope or syncope  NICM (nonischemic cardiomyopathy) (HCC) Low ejection fraction 2017 normalized ejection fraction on echocardiogram 2018 and since that time Shortness of breath likely secondary to deconditioning Appears euvolemic Recommend she continue her regular exercise program If shortness of breath symptoms do not start to improve, additional testing could be performed  Chronic systolic heart failure (HCC) - Plan: EKG 12-Lead Appear euvolemic Lasix as needed, continue carvedilol If blood pressure runs low at home may need to decrease carvedilol dosing  SVT (supraventricular tachycardia) (HCC) tolerating beta-blocker Denies recent episodes of tachycardia  continue Coreg 12.5 twice daily, monitor blood pressure    Signed, Dossie Arbour, M.D., Ph.D. 07/31/2023  [facility] [place], [place]  [phone]  

## 2023-07-31 NOTE — Telephone Encounter (Signed)
Rx ok'd for sumatriptan and zonegran

## 2023-07-31 NOTE — Telephone Encounter (Signed)
Zonisamide refilled: 02/15/2023 Imitrex refilled: 05/07/2023 Last OV: 06/14/2023 Next OV: 08/08/2023

## 2023-07-31 NOTE — Patient Instructions (Signed)

## 2023-08-02 ENCOUNTER — Encounter: Payer: Self-pay | Admitting: Internal Medicine

## 2023-08-02 ENCOUNTER — Ambulatory Visit (INDEPENDENT_AMBULATORY_CARE_PROVIDER_SITE_OTHER): Payer: Medicare Other | Admitting: Internal Medicine

## 2023-08-02 VITALS — BP 118/78 | HR 79 | Ht 61.0 in | Wt 164.2 lb

## 2023-08-02 DIAGNOSIS — R1310 Dysphagia, unspecified: Secondary | ICD-10-CM

## 2023-08-02 DIAGNOSIS — E0590 Thyrotoxicosis, unspecified without thyrotoxic crisis or storm: Secondary | ICD-10-CM

## 2023-08-02 NOTE — Patient Instructions (Signed)
Please continue Methimazole 2.5 mg daily.  Please stop at the lab.  You should have an endocrinology follow-up appointment in 1 year.

## 2023-08-02 NOTE — Progress Notes (Signed)
[name], female   DOB: December 13, 1950, 72 y.o.   MRN: [id number]  HPI  Kristi Greer is a 72 y.o.-year-old female, returning for follow-up for thyrotoxicosis.  She previously saw Dr. Everardo All, but last visit with me 1 year ago.  Interim history: At today's visit, patient is feeling well, without tremors, palpitations, heat intolerance, or weight loss. She had shoulder surgery 02/2023 after having a rotator cuff tear.  She was in physical therapy at [facility].   She had SOB likely 2/2 deconditioning after her shoulder surgery.  She recently saw her cardiologist who agreed.  She is restarting exercise.  Reviewed and addended history: She was diagnosed with thyrotoxicosis in 2015, when she presented with SVT.  She also has a history of atrial fibrillation, nonischemic cardiomyopathy, and CHF.  She was previously on medication for few months, but unclear which medication.  She declined RAI treatment per review of Dr. George Hugh notes.  At our first visit in 12/2021, she was on methimazole 5 mg daily >> we decreased the dose to 2.5 mg daily.    In 07/2022, we continued methimazole 2.5 mg daily.  I reviewed pt's thyroid tests: Lab Results  Component Value Date   TSH 0.84 11/08/2022   TSH 1.23 08/01/2022   TSH 2.27 03/02/2022   TSH 4.50 01/20/2022   TSH 2.03 06/07/2021   TSH 1.51 01/28/2021   TSH 2.30 05/11/2020   TSH 0.79 09/30/2019   TSH 1.48 04/25/2019   TSH 3.50 12/16/2018   TSH 4.31 09/24/2018   TSH 0.62 07/19/2018   TSH <0.01 (L) 04/30/2018   TSH <0.010 (L) 03/25/2018   TSH 0.01 (L) 12/10/2017   TSH 0.04 (L) 08/10/2017   TSH 0.03 (L) 06/22/2017   TSH 0.55 05/23/2016   TSH 0.46 11/25/2015   TSH 0.180 (L) 09/01/2015   TSH 0.11 (L) 05/21/2015   TSH 0.12 (L) 04/05/2015   TSH 0.09 (L) 08/06/2014   TSH 0.06 (L) 07/07/2014   TSH 0.069 (L) 07/03/2014   TSH 0.17 (L) 04/24/2014   TSH 0.02 (L) 12/03/2013   TSH 0.19 (L) 10/20/2013   TSH 0.14 (L) 09/17/2013    Lab Results  Component Value Date   FREET4 0.77 11/08/2022   FREET4 0.65 08/01/2022   FREET4 0.65 03/02/2022   FREET4 0.68 01/20/2022   FREET4 0.62 06/07/2021   FREET4 0.60 01/28/2021   FREET4 0.56 (L) 09/30/2019   FREET4 0.76 04/25/2019   FREET4 0.72 09/24/2018   FREET4 0.50 (L) 07/19/2018   FREET4 0.82 04/30/2018   FREET4 0.88 03/25/2018   FREET4 0.77 12/10/2017   FREET4 0.76 08/10/2017   FREET4 0.88 06/22/2017   FREET4 0.91 11/25/2015   FREET4 0.71 05/21/2015   FREET4 0.64 04/05/2015   FREET4 0.68 08/06/2014   FREET4 0.79 07/07/2014   FREET4 0.73 04/24/2014   FREET4 0.91 12/03/2013   FREET4 0.68 10/20/2013   FREET4 0.71 09/17/2013   Lab Results  Component Value Date   T3FREE 3.0 11/08/2022   T3FREE 4.2 08/01/2022   T3FREE 2.8 03/02/2022   T3FREE 3.0 01/20/2022   T3FREE 3.4 09/24/2018   T3FREE 2.9 07/19/2018   T3FREE 4.3 (H) 04/30/2018   T3FREE 3.8 12/10/2017   T3FREE 3.7 08/10/2017   T3FREE 3.9 06/22/2017   T3FREE 3.4 11/25/2015   T3FREE 3.5 09/02/2015   T3FREE 3.7 05/21/2015   T3FREE 3.1 04/05/2015   T3FREE 3.7 08/06/2014   T3FREE 3.3 07/07/2014   T3FREE 3.0 04/24/2014   T3FREE 3.2 12/03/2013  T3FREE 3.0 10/20/2013   T3FREE 3.5 09/17/2013   Antithyroid antibodies: Lab Results  Component Value Date   TSI <89 01/20/2022   Pt denies: - feeling nodules in neck - hoarseness - choking  She does mention occasional dysphagia.  She denies: - excessive sweating/heat intolerance - tremors - anxiety - palpitations - hyperdefecation - weight loss  Pt does not have a FH of thyroid ds. No FH of thyroid cancer.  No steroid use. No herbal supplements. No Biotin use.  Pt. also has a history of HTN, HL, migraines, prediabetes, cystocele - s/p cervical prolapse surgery. She had foot capsulitis 2024.  ROS: + see HPI  Past Medical History:  Diagnosis Date   Allergy    Anemia    Arthritis    Atrial fibrillation (HCC)    Cardiomyopathy,  nonischemic (HCC)    a.) felt to be tachycardia mediated NICM; b.) TTE 09/01/2015: EF 35-40%; c.) [facility] 11/26/2015: ED 30-35%, norm cors, mPA 16, PCWP 7, CO 4.7, CI 2.8; d.) TTE 01/31/2016: EF 40-45%; e.) cMRI 04/27/2016: EF 48%; f.) TTE 12/06/2016: EF 55-60%; g.) TTE 03/25/2018: EF 50-55%   Carpal tunnel syndrome, bilateral    Chronic back pain    Congestive heart failure (HCC)    a.) TTE 09/01/2015: EF 35-40%, mod MR; b.) R/LHC 11/26/2015: EF 30-35%; c.) TTE 01/31/2016: EF 40-45%, mild LVH, diffuse HK, triv MR/TR; d.) TTE 12/06/2016: EF 55-60%, mild MAC, G1DD; e.) TTE 03/25/2018: EF 50-55%, triv PR, G2DD.   Diverticulosis    DOE (dyspnea on exertion)    Dyspnea on exertion    Dysrhythmia    Environmental and seasonal allergies    Female cystocele    GERD (gastroesophageal reflux disease)    Hiatal hernia    History of complications due to general anesthesia    a.) PONV; b.) delayed emergence   Hyperlipidemia    Hypertension    Hyperthyroidism    Migraines    Nontraumatic complete tear of left rotator cuff    NSVT (nonsustained ventricular tachycardia) (HCC) 11/16/2015   a.) felt to be related to NICM in the setting of increased stressors, influenza A, hypotension Tx'd  with IVF bolus   Overactive detrusor    Personal history of colonic polyps    PONV (postoperative nausea and vomiting)    Septic shock (HCC) 11/14/2015   a.) admitted 11/14/2015 - 11/18/2015 for urosepsis + influenza + viral gastroenteritis (+ N/V/D). SBP unpon arrival was in the 60s --> improved to 80s after 4L IVFs --> started on pressors and admitted to ICU.   Skin cancer, basal cell    Squamous cell skin cancer    SUI (stress urinary incontinence, female)    SVT (supraventricular tachycardia) (HCC) 08/31/2015   a.) rates as high as 150 bpm; b.) EP feels as if event was "long RP tachycardia"   Tendinitis of left rotator cuff    Uterovaginal prolapse    Vaginal atrophy    Past Surgical History:  Procedure  Laterality Date   ANTERIOR AND POSTERIOR VAGINAL REPAIR W/ SACROSPINOUS LIGAMENT SUSPENSION  10/18/2012   CARDIAC CATHETERIZATION N/A 11/26/2015   Procedure: Right/Left Heart Cath and Coronary Angiography;  Surgeon: Dolores Patty, MD;  Location: [facility] INVASIVE CV LAB;  Service: Cardiovascular;  Laterality: N/A;   CARPAL TUNNEL RELEASE Right    CATARACT EXTRACTION Bilateral    CHOLECYSTECTOMY  2006   COLONOSCOPY WITH PROPOFOL N/A 03/19/2017   Procedure: COLONOSCOPY WITH PROPOFOL;  Surgeon: Christena Deem, MD;  Location: [facility]  ENDOSCOPY;  Service: Endoscopy;  Laterality: N/A;   COLPOPEXY  10/18/2012   CYSTOSCOPY WITH BIOPSY N/A 04/21/2022   Procedure: CYSTOSCOPY WITH BLADDER BIOPSY;  Surgeon: Sondra Come, MD;  Location: [facility] ORS;  Service: Urology;  Laterality: N/A;   CYSTOURETHROSCOPY  10/18/2012   ESOPHAGOGASTRODUODENOSCOPY (EGD) WITH PROPOFOL N/A 10/03/2016   Procedure: ESOPHAGOGASTRODUODENOSCOPY (EGD) WITH PROPOFOL;  Surgeon: Christena Deem, MD;  Location: [facility] ENDOSCOPY;  Service: Endoscopy;  Laterality: N/A;   HYSTEROSCOPY  2011, 10/2010   OOPHORECTOMY     REVERSE SHOULDER ARTHROPLASTY Right 06/17/2019   Procedure: REVERSE SHOULDER ARTHROPLASTY;  Surgeon: Christena Flake, MD;  Location: [facility] ORS;  Service: Orthopedics;  Laterality: Right;   REVERSE SHOULDER ARTHROPLASTY Left 03/13/2023   Procedure: REVERSE SHOULDER ARTHROPLASTY;  Surgeon: Christena Flake, MD;  Location: [facility] ORS;  Service: Orthopedics;  Laterality: Left;   ROBOTIC ASSISTED LAPAROSCOPIC SACROCOLPOPEXY N/A 06/21/2022   Procedure: XI ROBOTIC ASSISTED LAPAROSCOPIC SACROCOLPOPEXY;  Surgeon: Crist Fat, MD;  Location: [facility] ORS;  Service: Urology;  Laterality: N/A;  240 MINUTES   TONSILLECTOMY     TOTAL SHOULDER REPLACEMENT Right    TRIGGER FINGER RELEASE Bilateral    TUBAL LIGATION     VAGINAL HYSTERECTOMY     midurethral sling   Social History   Socioeconomic History   Marital status: Widowed    Spouse  name: Not on file   Number of children: 2   Years of education: Not on file   Highest education level: Not on file  Occupational History   Not on file  Tobacco Use   Smoking status: Never    Passive exposure: Never   Smokeless tobacco: Never  Vaping Use   Vaping status: Never Used  Substance and Sexual Activity   Alcohol use: Not Currently   Drug use: Never   Sexual activity: Yes    Birth control/protection: Post-menopausal  Other Topics Concern   Not on file  Social History Narrative   Lives alone   Social Determinants of Health   Financial Resource Strain: Low Risk  (11/20/2022)   Overall Financial Resource Strain (CARDIA)    Difficulty of Paying Living Expenses: Not hard at all  Food Insecurity: No Food Insecurity (11/20/2022)   Hunger Vital Sign    Worried About Running Out of Food in the Last Year: Never true    Ran Out of Food in the Last Year: Never true  Transportation Needs: No Transportation Needs (11/20/2022)   PRAPARE - Administrator, Civil Service (Medical): No    Lack of Transportation (Non-Medical): No  Physical Activity: Insufficiently Active (11/20/2022)   Exercise Vital Sign    Days of Exercise per Week: 2 days    Minutes of Exercise per Session: 70 min  Stress: No Stress Concern Present (11/20/2022)   [employer] of Occupational Health - Occupational Stress Questionnaire    Feeling of Stress : Not at all  Social Connections: Moderately Integrated (11/20/2022)   Social Connection and Isolation Panel [NHANES]    Frequency of Communication with Friends and Family: More than three times a week    Frequency of Social Gatherings with Friends and Family: More than three times a week    Attends Religious Services: More than 4 times per year    Active Member of [employer] or Organizations: Yes    Attends Banker Meetings: Not on file    Marital Status: Widowed  Intimate Partner Violence: Not At Risk (11/20/2022)   Humiliation, Afraid,  Rape, and Kick questionnaire    Fear of Current or Ex-Partner: No    Emotionally Abused: No    Physically Abused: No    Sexually Abused: No   Current Outpatient Medications on File Prior to Visit  Medication Sig Dispense Refill   acetaminophen (TYLENOL) 500 MG tablet Take 500-1,000 mg by mouth every 6 (six) hours as needed for moderate pain or headache.     Calcium Carb-Cholecalciferol (CALCIUM-VITAMIN D3 PO) Take 1 tablet by mouth daily. 600 mg  20 mcg     calcium carbonate (TUMS - DOSED IN MG ELEMENTAL CALCIUM) 500 MG chewable tablet Chew 1 tablet by mouth daily as needed for indigestion or heartburn.     carvedilol (COREG) 12.5 MG tablet Take 1 tablet (12.5 mg total) by mouth 2 (two) times daily with a meal. 180 tablet 3   Cholecalciferol (D3-1000) 25 MCG (1000 UT) capsule Take 1,000 Units by mouth daily.     docusate sodium (COLACE) 100 MG capsule Take 1 capsule (100 mg total) by mouth daily as needed.     estradiol (ESTRACE VAGINAL) 0.1 MG/GM vaginal cream Place 1 Applicatorful vaginally 3 (three) times a week. Use 1 small dolyp of cream on tip of index finger and swap the inside of the vagina (Patient taking differently: Place 1 Applicatorful vaginally as needed. Use 1 small dolyp of cream on tip of index finger and swap the inside of the vagina) 42.5 g 1   fexofenadine (ALLEGRA) 180 MG tablet Take 180 mg by mouth at bedtime.      furosemide (LASIX) 20 MG tablet Take 1 tablet (20 mg total) by mouth daily as needed (only when home).     meloxicam (MOBIC) 15 MG tablet TAKE 1 TABLET (15 MG TOTAL) BY MOUTH DAILY. 30 tablet 1   methimazole (TAPAZOLE) 5 MG tablet Take 0.5 tablets (2.5 mg total) by mouth daily. 90 tablet 3   rosuvastatin (CRESTOR) 10 MG tablet TAKE 1 TABLET BY MOUTH EVERY DAY 90 tablet 3   SUMAtriptan (IMITREX) 100 MG tablet TAKE 1 TABLET BY MOUTH ONCE. MAY REPEAT IN 2 HOURS IF HEADACHE PERSISTS OR RECURS. 9 tablet 0   triamcinolone (NASACORT) 55 MCG/ACT AERO nasal inhaler Place  1 spray into the nose daily as needed (allergies).     zinc gluconate 50 MG tablet Take 50 mg by mouth daily.     zonisamide (ZONEGRAN) 100 MG capsule TAKE 1 CAPSULE BY MOUTH EVERY DAY 90 capsule 0   No current facility-administered medications on file prior to visit.   No Known Allergies Family History  Problem Relation Age of Onset   Hypertension Mother    Stroke Mother    Diabetes Mother    Heart disease Father    Hypercholesterolemia Father    Hypertension Father    Stroke Father    Heart attack Father    Breast cancer Neg Hx    Colon cancer Neg Hx    Thyroid disease Neg Hx    PE: BP 118/78   Pulse 79   Ht 5\' 1"  (1.549 m)   Wt 164 lb 3.2 oz (74.5 kg)   LMP 10/18/2012   SpO2 96%   BMI 31.03 kg/m  Wt Readings from Last 10 Encounters:  08/02/23 164 lb 3.2 oz (74.5 kg)  07/31/23 166 lb (75.3 kg)  06/14/23 165 lb (74.8 kg)  03/13/23 160 lb (72.6 kg)  03/06/23 166 lb 10.7 oz (75.6 kg)  02/12/23 161 lb (73 kg)  11/20/22 157 lb (  71.2 kg)  10/13/22 162 lb 6.4 oz (73.7 kg)  08/01/22 162 lb 6.4 oz (73.7 kg)  06/21/22 159 lb 2 oz (72.2 kg)   Constitutional: normal weight, in NAD Eyes: EOMI, no exophthalmos, no lid lag, no stare ENT: no thyromegaly, at no cervical lymphadenopathy Cardiovascular: RRR, No MRG Respiratory: CTA B Musculoskeletal: no deformities Skin: no rashes Neurological: no tremor with outstretched hands  ASSESSMENT: 1. Thyrotoxicosis  2.  Dysphagia  PLAN:  1. Patient with history of thyrotoxicosis, but without thyrotoxic symptoms: No weight loss, heat intolerance, hyperdefecation, palpitations, anxiety.  Her thyrotoxicosis was assumed to be related to Graves' disease and she was initially started on methimazole.  When I first saw her she was on 5 mg daily.  Reviewing her TFTs since 2019, these were always normal, so we decreased the dose of methimazole to 2.5 mg daily.  At last visit, TSH was normal but free T3 was at the upper limit of the target  range so I advised her to continue the low-dose methimazole. -Of note, we checked her TSI's and these were undetectable in 12/2021.  We discussed that this does not necessarily rule out mild Graves' disease, which appears to have resolved.  Other possible etiologies are resolving thyroiditis or toxic multinodular goiter/toxic adenoma. -At today's visit, she feels well, without thyrotoxic signs or symptoms.  She had shortness of breath, likely related to deconditioning as she was mostly inactive for few weeks after her shoulder surgery.  She is now feeling better. -We will recheck her TFTs and see if we can stop methimazole -She continues on carvedilol per cardiology.  At today's visit she is not tachycardic or tremulous -She does not have signs of active Graves' arthropathy including double vision, blurry vision, eye pain, chemosis -If the thyroid tests remain normal and we end up stopping methimazole, she may be able to follow-up with PCP after the next visit, with annual TSH and referred back to endocrinology if this becomes abnormal. -However, for now, I did advise her to schedule another appointment in a year.  2.  Dysphagia -This is intermittent, not bothersome -On physical exam, I cannot feel an enlarged thyroid on palpation -We discussed that this may be related to dry mouth/throat, but unlikely to be related to the thyroid.  Office Visit on 08/02/2023  Component Date Value Ref Range Status   TSH 08/02/2023 0.93  0.40 - 4.50 mIU/L Final   Free T4 08/02/2023 0.8  0.8 - 1.8 ng/dL Final   T3, Free 11/91/4782 3.6  2.3 - 4.2 pg/mL Final  TFTs are all normal.Will continue the same dose of methimazole.  Carlus Pavlov, MD PhD [facility]

## 2023-08-03 LAB — T4, FREE: Free T4: 0.8 ng/dL (ref 0.8–1.8)

## 2023-08-03 LAB — TSH: TSH: 0.93 m[IU]/L (ref 0.40–4.50)

## 2023-08-03 LAB — T3, FREE: T3, Free: 3.6 pg/mL (ref 2.3–4.2)

## 2023-08-06 ENCOUNTER — Encounter: Payer: Self-pay | Admitting: Internal Medicine

## 2023-08-08 ENCOUNTER — Encounter: Payer: Self-pay | Admitting: Internal Medicine

## 2023-08-08 ENCOUNTER — Ambulatory Visit: Payer: Medicare Other | Admitting: Internal Medicine

## 2023-08-08 VITALS — BP 126/70 | HR 70 | Temp 98.0°F | Resp 16 | Ht 61.0 in | Wt 164.0 lb

## 2023-08-08 DIAGNOSIS — I471 Supraventricular tachycardia, unspecified: Secondary | ICD-10-CM | POA: Diagnosis not present

## 2023-08-08 DIAGNOSIS — I420 Dilated cardiomyopathy: Secondary | ICD-10-CM | POA: Diagnosis not present

## 2023-08-08 DIAGNOSIS — Z860100 Personal history of colon polyps, unspecified: Secondary | ICD-10-CM | POA: Diagnosis not present

## 2023-08-08 DIAGNOSIS — E7800 Pure hypercholesterolemia, unspecified: Secondary | ICD-10-CM | POA: Diagnosis not present

## 2023-08-08 DIAGNOSIS — E0590 Thyrotoxicosis, unspecified without thyrotoxic crisis or storm: Secondary | ICD-10-CM | POA: Diagnosis not present

## 2023-08-08 DIAGNOSIS — R739 Hyperglycemia, unspecified: Secondary | ICD-10-CM

## 2023-08-08 DIAGNOSIS — I5022 Chronic systolic (congestive) heart failure: Secondary | ICD-10-CM | POA: Diagnosis not present

## 2023-08-08 DIAGNOSIS — K219 Gastro-esophageal reflux disease without esophagitis: Secondary | ICD-10-CM

## 2023-08-08 NOTE — Assessment & Plan Note (Signed)
On crestor.  Low cholesterol diet and exercise.  Follow lipid panel and liver function tests.

## 2023-08-08 NOTE — Progress Notes (Signed)
Subjective:    [name], female    DOB: February 12, 1951, 72 y.o.   MRN: [id number]  Patient here for  Chief Complaint  Patient presents with   Medical Management of Chronic Issues    HPI Here for a scheduled follow up - follow up regarding afib, CHF, CM, hypercholesterolemia and hypertension. S/p recent shoulder surgery. Completed PT. Recently had noticed some increase dyspnea on exertion. Saw Dr Mariah Milling 07/31/23. Per his review, felt sob was likely secondary to deconditioning. Recommended to continue regular exercise program. Recommended to continue carvedilol and lasix prn. Had f/u with endocrinology 08/02/23 - TFTs normal. Recommended to continue methimazole. She reports she is feeling better.  Breathing better.  Back going to exercise. No chest pain. No abdominal pain or bowel change.    Past Medical History:  Diagnosis Date   Allergy    Anemia    Arthritis    Atrial fibrillation (HCC)    Cardiomyopathy, nonischemic (HCC)    a.) felt to be tachycardia mediated NICM; b.) TTE 09/01/2015: EF 35-40%; c.) [facility] 11/26/2015: ED 30-35%, norm cors, mPA 16, PCWP 7, CO 4.7, CI 2.8; d.) TTE 01/31/2016: EF 40-45%; e.) cMRI 04/27/2016: EF 48%; f.) TTE 12/06/2016: EF 55-60%; g.) TTE 03/25/2018: EF 50-55%   Carpal tunnel syndrome, bilateral    Chronic back pain    Congestive heart failure (HCC)    a.) TTE 09/01/2015: EF 35-40%, mod MR; b.) R/LHC 11/26/2015: EF 30-35%; c.) TTE 01/31/2016: EF 40-45%, mild LVH, diffuse HK, triv MR/TR; d.) TTE 12/06/2016: EF 55-60%, mild MAC, G1DD; e.) TTE 03/25/2018: EF 50-55%, triv PR, G2DD.   Diverticulosis    DOE (dyspnea on exertion)    Dyspnea on exertion    Dysrhythmia    Environmental and seasonal allergies    Female cystocele    GERD (gastroesophageal reflux disease)    Hiatal hernia    History of complications due to general anesthesia    a.) PONV; b.) delayed emergence   Hyperlipidemia    Hypertension    Hyperthyroidism    Migraines     Nontraumatic complete tear of left rotator cuff    NSVT (nonsustained ventricular tachycardia) (HCC) 11/16/2015   a.) felt to be related to NICM in the setting of increased stressors, influenza A, hypotension Tx'd  with IVF bolus   Overactive detrusor    Personal history of colonic polyps    PONV (postoperative nausea and vomiting)    Septic shock (HCC) 11/14/2015   a.) admitted 11/14/2015 - 11/18/2015 for urosepsis + influenza + viral gastroenteritis (+ N/V/D). SBP unpon arrival was in the 60s --> improved to 80s after 4L IVFs --> started on pressors and admitted to ICU.   Skin cancer, basal cell    Squamous cell skin cancer    SUI (stress urinary incontinence, female)    SVT (supraventricular tachycardia) (HCC) 08/31/2015   a.) rates as high as 150 bpm; b.) EP feels as if event was "long RP tachycardia"   Tendinitis of left rotator cuff    Uterovaginal prolapse    Vaginal atrophy    Past Surgical History:  Procedure Laterality Date   ANTERIOR AND POSTERIOR VAGINAL REPAIR W/ SACROSPINOUS LIGAMENT SUSPENSION  10/18/2012   CARDIAC CATHETERIZATION N/A 11/26/2015   Procedure: Right/Left Heart Cath and Coronary Angiography;  Surgeon: Dolores Patty, MD;  Location: [facility] INVASIVE CV LAB;  Service: Cardiovascular;  Laterality: N/A;   CARPAL TUNNEL RELEASE Right    CATARACT EXTRACTION Bilateral  CHOLECYSTECTOMY  2006   COLONOSCOPY WITH PROPOFOL N/A 03/19/2017   Procedure: COLONOSCOPY WITH PROPOFOL;  Surgeon: Christena Deem, MD;  Location: [facility] ENDOSCOPY;  Service: Endoscopy;  Laterality: N/A;   COLPOPEXY  10/18/2012   CYSTOSCOPY WITH BIOPSY N/A 04/21/2022   Procedure: CYSTOSCOPY WITH BLADDER BIOPSY;  Surgeon: Sondra Come, MD;  Location: [facility] ORS;  Service: Urology;  Laterality: N/A;   CYSTOURETHROSCOPY  10/18/2012   ESOPHAGOGASTRODUODENOSCOPY (EGD) WITH PROPOFOL N/A 10/03/2016   Procedure: ESOPHAGOGASTRODUODENOSCOPY (EGD) WITH PROPOFOL;  Surgeon: Christena Deem, MD;   Location: [facility] ENDOSCOPY;  Service: Endoscopy;  Laterality: N/A;   HYSTEROSCOPY  2011, 10/2010   OOPHORECTOMY     REVERSE SHOULDER ARTHROPLASTY Right 06/17/2019   Procedure: REVERSE SHOULDER ARTHROPLASTY;  Surgeon: Christena Flake, MD;  Location: [facility] ORS;  Service: Orthopedics;  Laterality: Right;   REVERSE SHOULDER ARTHROPLASTY Left 03/13/2023   Procedure: REVERSE SHOULDER ARTHROPLASTY;  Surgeon: Christena Flake, MD;  Location: [facility] ORS;  Service: Orthopedics;  Laterality: Left;   ROBOTIC ASSISTED LAPAROSCOPIC SACROCOLPOPEXY N/A 06/21/2022   Procedure: XI ROBOTIC ASSISTED LAPAROSCOPIC SACROCOLPOPEXY;  Surgeon: Crist Fat, MD;  Location: [facility] ORS;  Service: Urology;  Laterality: N/A;  240 MINUTES   TONSILLECTOMY     TOTAL SHOULDER REPLACEMENT Right    TRIGGER FINGER RELEASE Bilateral    TUBAL LIGATION     VAGINAL HYSTERECTOMY     midurethral sling   Family History  Problem Relation Age of Onset   Hypertension Mother    Stroke Mother    Diabetes Mother    Heart disease Father    Hypercholesterolemia Father    Hypertension Father    Stroke Father    Heart attack Father    Breast cancer Neg Hx    Colon cancer Neg Hx    Thyroid disease Neg Hx    Social History   Socioeconomic History   Marital status: Widowed    Spouse name: Not on file   Number of children: 2   Years of education: Not on file   Highest education level: Not on file  Occupational History   Not on file  Tobacco Use   Smoking status: Never    Passive exposure: Never   Smokeless tobacco: Never  Vaping Use   Vaping status: Never Used  Substance and Sexual Activity   Alcohol use: Not Currently   Drug use: Never   Sexual activity: Yes    Birth control/protection: Post-menopausal  Other Topics Concern   Not on file  Social History Narrative   Lives alone   Social Determinants of Health   Financial Resource Strain: Low Risk  (11/20/2022)   Overall Financial Resource Strain (CARDIA)    Difficulty of  Paying Living Expenses: Not hard at all  Food Insecurity: No Food Insecurity (11/20/2022)   Hunger Vital Sign    Worried About Running Out of Food in the Last Year: Never true    Ran Out of Food in the Last Year: Never true  Transportation Needs: No Transportation Needs (11/20/2022)   PRAPARE - Administrator, Civil Service (Medical): No    Lack of Transportation (Non-Medical): No  Physical Activity: Insufficiently Active (11/20/2022)   Exercise Vital Sign    Days of Exercise per Week: 2 days    Minutes of Exercise per Session: 70 min  Stress: No Stress Concern Present (11/20/2022)   [employer] of Occupational Health - Occupational Stress Questionnaire    Feeling of Stress : Not at  all  Social Connections: Moderately Integrated (11/20/2022)   Social Connection and Isolation Panel [NHANES]    Frequency of Communication with Friends and Family: More than three times a week    Frequency of Social Gatherings with Friends and Family: More than three times a week    Attends Religious Services: More than 4 times per year    Active Member of [employer] or Organizations: Yes    Attends Banker Meetings: Not on file    Marital Status: Widowed     Review of Systems  Constitutional:  Negative for appetite change and unexpected weight change.  HENT:  Negative for congestion and sinus pressure.   Respiratory:  Negative for cough, chest tightness and shortness of breath.   Cardiovascular:  Negative for chest pain, palpitations and leg swelling.  Gastrointestinal:  Negative for abdominal pain, diarrhea, nausea and vomiting.  Genitourinary:  Negative for difficulty urinating and dysuria.  Musculoskeletal:  Negative for joint swelling and myalgias.  Skin:  Negative for color change and rash.  Neurological:  Negative for dizziness and headaches.  Psychiatric/Behavioral:  Negative for agitation and dysphoric mood.        Objective:     BP 126/70   Pulse 70   Temp 98 F  (36.7 C)   Resp 16   Ht 5\' 1"  (1.549 m)   Wt 164 lb (74.4 kg)   LMP 10/18/2012   SpO2 98%   BMI 30.99 kg/m  Wt Readings from Last 3 Encounters:  08/08/23 164 lb (74.4 kg)  08/02/23 164 lb 3.2 oz (74.5 kg)  07/31/23 166 lb (75.3 kg)    Physical Exam Vitals reviewed.  Constitutional:      General: She is not in acute distress.    Appearance: Normal appearance.  HENT:     Head: Normocephalic and atraumatic.     Right Ear: External ear normal.     Left Ear: External ear normal.     Mouth/Throat:     Pharynx: No oropharyngeal exudate or posterior oropharyngeal erythema.  Eyes:     General: No scleral icterus.       Right eye: No discharge.        Left eye: No discharge.     Conjunctiva/sclera: Conjunctivae normal.  Neck:     Thyroid: No thyromegaly.  Cardiovascular:     Rate and Rhythm: Normal rate and regular rhythm.  Pulmonary:     Effort: No respiratory distress.     Breath sounds: Normal breath sounds. No wheezing.  Abdominal:     General: Bowel sounds are normal.     Palpations: Abdomen is soft.     Tenderness: There is no abdominal tenderness.  Musculoskeletal:        General: No swelling or tenderness.     Cervical back: Neck supple. No tenderness.  Lymphadenopathy:     Cervical: No cervical adenopathy.  Skin:    Findings: No erythema or rash.  Neurological:     Mental Status: She is alert.  Psychiatric:        Mood and Affect: Mood normal.        Behavior: Behavior normal.      Outpatient Encounter Medications as of 08/08/2023  Medication Sig   acetaminophen (TYLENOL) 500 MG tablet Take 500-1,000 mg by mouth every 6 (six) hours as needed for moderate pain or headache.   Calcium Carb-Cholecalciferol (CALCIUM-VITAMIN D3 PO) Take 1 tablet by mouth daily. 600 mg  20 mcg   calcium carbonate (TUMS -  DOSED IN MG ELEMENTAL CALCIUM) 500 MG chewable tablet Chew 1 tablet by mouth daily as needed for indigestion or heartburn.   carvedilol (COREG) 12.5 MG tablet  Take 1 tablet (12.5 mg total) by mouth 2 (two) times daily with a meal.   Cholecalciferol (D3-1000) 25 MCG (1000 UT) capsule Take 1,000 Units by mouth daily.   docusate sodium (COLACE) 100 MG capsule Take 1 capsule (100 mg total) by mouth daily as needed.   estradiol (ESTRACE VAGINAL) 0.1 MG/GM vaginal cream Place 1 Applicatorful vaginally 3 (three) times a week. Use 1 small dolyp of cream on tip of index finger and swap the inside of the vagina (Patient taking differently: Place 1 Applicatorful vaginally as needed. Use 1 small dolyp of cream on tip of index finger and swap the inside of the vagina)   fexofenadine (ALLEGRA) 180 MG tablet Take 180 mg by mouth at bedtime.    furosemide (LASIX) 20 MG tablet Take 1 tablet (20 mg total) by mouth daily as needed (only when home).   meloxicam (MOBIC) 15 MG tablet TAKE 1 TABLET (15 MG TOTAL) BY MOUTH DAILY.   methimazole (TAPAZOLE) 5 MG tablet Take 0.5 tablets (2.5 mg total) by mouth daily.   rosuvastatin (CRESTOR) 10 MG tablet TAKE 1 TABLET BY MOUTH EVERY DAY   SUMAtriptan (IMITREX) 100 MG tablet TAKE 1 TABLET BY MOUTH ONCE. MAY REPEAT IN 2 HOURS IF HEADACHE PERSISTS OR RECURS.   triamcinolone (NASACORT) 55 MCG/ACT AERO nasal inhaler Place 1 spray into the nose daily as needed (allergies).   zinc gluconate 50 MG tablet Take 50 mg by mouth daily.   zonisamide (ZONEGRAN) 100 MG capsule TAKE 1 CAPSULE BY MOUTH EVERY DAY   No facility-administered encounter medications on file as of 08/08/2023.     Lab Results  Component Value Date   WBC 7.6 03/05/2023   HGB 13.3 03/05/2023   HCT 41.4 03/05/2023   PLT 208.0 03/05/2023   GLUCOSE 101 (H) 06/11/2023   CHOL 140 06/11/2023   TRIG 133.0 06/11/2023   HDL 46.90 06/11/2023   LDLCALC 67 06/11/2023   ALT 13 06/11/2023   AST 16 06/11/2023   NA 141 06/11/2023   K 4.3 06/11/2023   CL 108 06/11/2023   CREATININE 0.78 06/11/2023   BUN 18 06/11/2023   CO2 27 06/11/2023   TSH 0.93 08/02/2023   INR 0.98  11/26/2015   HGBA1C 5.8 06/11/2023    DG Shoulder Left Port  Result Date: 03/13/2023 CLINICAL DATA:  Status post reverse arthroplasty of shoulder, left EXAM: LEFT SHOULDER COMPARISON:  None Available. FINDINGS: Status post left reverse shoulder arthroplasty. No definite evidence of acute fracture or dislocation on limited positioning. Expected postoperative swelling. Alone IMPRESSION: Postoperative changes of left reverse shoulder arthroplasty without evidence of immediate hardware complication. Electronically Signed   By: Feliberto Harts M.D.   On: 03/13/2023 14:15   [place] OR NERVE BLOCK-IMAGE ONLY [facility])  Result Date: 03/13/2023 There is no interpretation for this exam.  This order is for images obtained during a surgical procedure.  Please See "Surgeries" Tab for more information regarding the procedure.       Assessment & Plan:  Hyperglycemia Assessment & Plan: Low carb diet and exercise.  Follow met b and a1c.   Orders: -     Hemoglobin A1c; Future  Hypercholesterolemia Assessment & Plan: On crestor.  Low cholesterol diet and exercise.  Follow lipid panel and liver function tests.    Orders: -  Lipid panel; Future -     Hepatic function panel; Future -     Basic metabolic panel; Future  Chronic systolic heart failure (HCC) Assessment & Plan: Continue coreg and lasix prn. No evidence of volume overload.  Follow.    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HCC) Assessment & Plan: No evidence of volume overload.  On coreg and lasix prn.  Follow. Follow metabolic panel.    Gastroesophageal reflux disease, unspecified whether esophagitis present Assessment & Plan: Upper symptoms controlled on protonix.     History of colonic polyps Assessment & Plan: Colonoscopy 02/2017.  Recommended f/u in 5 years.  Had f/u colonoscopy 02/21/21.    Hyperthyroidism Assessment & Plan: Had f/u with endocrinology 08/02/23 - TFTs normal. Recommended to continue methimazole.    SVT  (supraventricular tachycardia) (HCC) Assessment & Plan: On coreg.  Stable.       Dale [place], MD

## 2023-08-08 NOTE — Assessment & Plan Note (Signed)
Low carb diet and exercise.  Follow met b and a1c.   

## 2023-08-08 NOTE — Assessment & Plan Note (Signed)
Upper symptoms controlled on protonix.  

## 2023-08-08 NOTE — Assessment & Plan Note (Signed)
No evidence of volume overload.  On coreg and lasix prn.  Follow. Follow metabolic panel.

## 2023-08-08 NOTE — Assessment & Plan Note (Signed)
Had f/u with endocrinology 08/02/23 - TFTs normal. Recommended to continue methimazole.

## 2023-08-08 NOTE — Assessment & Plan Note (Signed)
Colonoscopy 02/2017.  Recommended f/u in 5 years.  Had f/u colonoscopy 02/21/21.  

## 2023-08-08 NOTE — Assessment & Plan Note (Signed)
On coreg.  Stable.

## 2023-08-08 NOTE — Assessment & Plan Note (Signed)
Continue coreg and lasix prn. No evidence of volume overload.  Follow.

## 2023-08-26 ENCOUNTER — Other Ambulatory Visit: Payer: Self-pay | Admitting: Internal Medicine

## 2023-08-29 ENCOUNTER — Other Ambulatory Visit: Payer: Self-pay | Admitting: Internal Medicine

## 2023-09-06 DIAGNOSIS — L570 Actinic keratosis: Secondary | ICD-10-CM | POA: Diagnosis not present

## 2023-09-10 DIAGNOSIS — M12812 Other specific arthropathies, not elsewhere classified, left shoulder: Secondary | ICD-10-CM | POA: Diagnosis not present

## 2023-09-10 DIAGNOSIS — Z96612 Presence of left artificial shoulder joint: Secondary | ICD-10-CM | POA: Diagnosis not present

## 2023-09-10 DIAGNOSIS — M75122 Complete rotator cuff tear or rupture of left shoulder, not specified as traumatic: Secondary | ICD-10-CM | POA: Diagnosis not present

## 2023-09-25 ENCOUNTER — Other Ambulatory Visit: Payer: Self-pay | Admitting: Internal Medicine

## 2023-09-26 NOTE — Telephone Encounter (Signed)
Need to confirm how often she is taking the medication.  Just refilled 08/30/23.  Need to know if headaches are increasing.

## 2023-09-27 NOTE — Telephone Encounter (Signed)
This was sent by the pharmacy. She does not need refilled and uses very sparingly. Please refuse

## 2023-10-27 ENCOUNTER — Other Ambulatory Visit: Payer: Self-pay | Admitting: Internal Medicine

## 2023-11-01 ENCOUNTER — Other Ambulatory Visit (INDEPENDENT_AMBULATORY_CARE_PROVIDER_SITE_OTHER): Payer: Medicare Other

## 2023-11-01 DIAGNOSIS — E7800 Pure hypercholesterolemia, unspecified: Secondary | ICD-10-CM | POA: Diagnosis not present

## 2023-11-01 DIAGNOSIS — R739 Hyperglycemia, unspecified: Secondary | ICD-10-CM | POA: Diagnosis not present

## 2023-11-01 LAB — HEMOGLOBIN A1C: Hgb A1c MFr Bld: 6 % (ref 4.6–6.5)

## 2023-11-01 LAB — LIPID PANEL
Cholesterol: 137 mg/dL (ref 0–200)
HDL: 50.5 mg/dL (ref >39.00)
LDL Cholesterol: 56 mg/dL (ref 0–99)
NonHDL: 86.38
Total CHOL/HDL Ratio: 3
Triglycerides: 151 mg/dL — ABNORMAL HIGH (ref 0.0–149.0)
VLDL: 30.2 mg/dL (ref 0.0–40.0)

## 2023-11-01 LAB — BASIC METABOLIC PANEL
BUN: 18 mg/dL (ref 6–23)
CO2: 27 meq/L (ref 19–32)
Calcium: 9.1 mg/dL (ref 8.4–10.5)
Chloride: 109 meq/L (ref 96–112)
Creatinine, Ser: 0.78 mg/dL (ref 0.40–1.20)
GFR: 76.71 mL/min (ref >60.00)
Glucose, Bld: 106 mg/dL — ABNORMAL HIGH (ref 70–99)
Potassium: 4.7 meq/L (ref 3.5–5.1)
Sodium: 142 meq/L (ref 135–145)

## 2023-11-01 LAB — HEPATIC FUNCTION PANEL
ALT: 14 U/L (ref 0–35)
AST: 18 U/L (ref 0–37)
Albumin: 4 g/dL (ref 3.5–5.2)
Alkaline Phosphatase: 105 U/L (ref 39–117)
Bilirubin, Direct: 0.1 mg/dL (ref 0.0–0.3)
Total Bilirubin: 0.4 mg/dL (ref 0.2–1.2)
Total Protein: 6.5 g/dL (ref 6.0–8.3)

## 2023-11-06 ENCOUNTER — Ambulatory Visit (INDEPENDENT_AMBULATORY_CARE_PROVIDER_SITE_OTHER): Payer: Medicare Other | Admitting: Internal Medicine

## 2023-11-06 VITALS — BP 118/70 | HR 78 | Temp 98.0°F | Resp 16 | Ht 61.0 in | Wt 166.0 lb

## 2023-11-06 DIAGNOSIS — I5022 Chronic systolic (congestive) heart failure: Secondary | ICD-10-CM

## 2023-11-06 DIAGNOSIS — Z96611 Presence of right artificial shoulder joint: Secondary | ICD-10-CM

## 2023-11-06 DIAGNOSIS — R739 Hyperglycemia, unspecified: Secondary | ICD-10-CM

## 2023-11-06 DIAGNOSIS — E7800 Pure hypercholesterolemia, unspecified: Secondary | ICD-10-CM | POA: Diagnosis not present

## 2023-11-06 DIAGNOSIS — Z8669 Personal history of other diseases of the nervous system and sense organs: Secondary | ICD-10-CM

## 2023-11-06 DIAGNOSIS — F439 Reaction to severe stress, unspecified: Secondary | ICD-10-CM

## 2023-11-06 DIAGNOSIS — K219 Gastro-esophageal reflux disease without esophagitis: Secondary | ICD-10-CM

## 2023-11-06 DIAGNOSIS — I471 Supraventricular tachycardia, unspecified: Secondary | ICD-10-CM | POA: Diagnosis not present

## 2023-11-06 DIAGNOSIS — E0590 Thyrotoxicosis, unspecified without thyrotoxic crisis or storm: Secondary | ICD-10-CM

## 2023-11-06 DIAGNOSIS — I420 Dilated cardiomyopathy: Secondary | ICD-10-CM | POA: Diagnosis not present

## 2023-11-06 DIAGNOSIS — D72829 Elevated white blood cell count, unspecified: Secondary | ICD-10-CM | POA: Diagnosis not present

## 2023-11-06 DIAGNOSIS — Z860100 Personal history of colon polyps, unspecified: Secondary | ICD-10-CM | POA: Diagnosis not present

## 2023-11-06 DIAGNOSIS — Z1231 Encounter for screening mammogram for malignant neoplasm of breast: Secondary | ICD-10-CM

## 2023-11-06 NOTE — Progress Notes (Signed)
 Subjective:    [name], female    DOB: 1951-01-02, 73 y.o.   MRN: [id number]  Patient here for  Chief Complaint  Patient presents with   Medical Management of Chronic Issues    HPI Here for a scheduled follow up - follow up regarding afib, CHF, CM, hypercholesterolemia and hypertension. S/p recent shoulder surgery. Completed PT. Had f/u with ortho 09/10/23 - doing well. Previously noticed some increase dyspnea on exertion. Saw Dr Mariah Milling 07/31/23. Per his review, felt sob was likely secondary to deconditioning. Recommended to continue regular exercise program. Recommended to continue carvedilol and lasix prn. Had f/u with endocrinology 08/02/23 - TFTs normal. Recommended to continue methimazole. She reports she is doing well. Feels good. No chest pain. Breathing stable. Discussed diet. Discussed decreasing carbs - bread and decreasing the amount of sweet tea she is drinking. Discussed recent labs.    Past Medical History:  Diagnosis Date   Allergy    Anemia    Arthritis    Atrial fibrillation (HCC)    Cardiomyopathy, nonischemic (HCC)    a.) felt to be tachycardia mediated NICM; b.) TTE 09/01/2015: EF 35-40%; c.) [facility] 11/26/2015: ED 30-35%, norm cors, mPA 16, PCWP 7, CO 4.7, CI 2.8; d.) TTE 01/31/2016: EF 40-45%; e.) cMRI 04/27/2016: EF 48%; f.) TTE 12/06/2016: EF 55-60%; g.) TTE 03/25/2018: EF 50-55%   Carpal tunnel syndrome, bilateral    Chronic back pain    Congestive heart failure (HCC)    a.) TTE 09/01/2015: EF 35-40%, mod MR; b.) R/LHC 11/26/2015: EF 30-35%; c.) TTE 01/31/2016: EF 40-45%, mild LVH, diffuse HK, triv MR/TR; d.) TTE 12/06/2016: EF 55-60%, mild MAC, G1DD; e.) TTE 03/25/2018: EF 50-55%, triv PR, G2DD.   Diverticulosis    DOE (dyspnea on exertion)    Dyspnea on exertion    Dysrhythmia    Environmental and seasonal allergies    Female cystocele    GERD (gastroesophageal reflux disease)    Hiatal hernia    History of complications due to general  anesthesia    a.) PONV; b.) delayed emergence   Hyperlipidemia    Hypertension    Hyperthyroidism    Migraines    Nontraumatic complete tear of left rotator cuff    NSVT (nonsustained ventricular tachycardia) (HCC) 11/16/2015   a.) felt to be related to NICM in the setting of increased stressors, influenza A, hypotension Tx'd  with IVF bolus   Overactive detrusor    Personal history of colonic polyps    PONV (postoperative nausea and vomiting)    Septic shock (HCC) 11/14/2015   a.) admitted 11/14/2015 - 11/18/2015 for urosepsis + influenza + viral gastroenteritis (+ N/V/D). SBP unpon arrival was in the 60s --> improved to 80s after 4L IVFs --> started on pressors and admitted to ICU.   Skin cancer, basal cell    Squamous cell skin cancer    SUI (stress urinary incontinence, female)    SVT (supraventricular tachycardia) (HCC) 08/31/2015   a.) rates as high as 150 bpm; b.) EP feels as if event was "long RP tachycardia"   Tendinitis of left rotator cuff    Uterovaginal prolapse    Vaginal atrophy    Past Surgical History:  Procedure Laterality Date   ANTERIOR AND POSTERIOR VAGINAL REPAIR W/ SACROSPINOUS LIGAMENT SUSPENSION  10/18/2012   CARDIAC CATHETERIZATION N/A 11/26/2015   Procedure: Right/Left Heart Cath and Coronary Angiography;  Surgeon: Dolores Patty, MD;  Location: [facility] [address] INVASIVE CV LAB;  Service: Cardiovascular;  Laterality: N/A;   CARPAL TUNNEL RELEASE Right    CATARACT EXTRACTION Bilateral    CHOLECYSTECTOMY  2006   COLONOSCOPY WITH PROPOFOL N/A 03/19/2017   Procedure: COLONOSCOPY WITH PROPOFOL;  Surgeon: Christena Deem, MD;  Location: [facility] ENDOSCOPY;  Service: Endoscopy;  Laterality: N/A;   COLPOPEXY  10/18/2012   CYSTOSCOPY WITH BIOPSY N/A 04/21/2022   Procedure: CYSTOSCOPY WITH BLADDER BIOPSY;  Surgeon: Sondra Come, MD;  Location: [facility] ORS;  Service: Urology;  Laterality: N/A;   CYSTOURETHROSCOPY  10/18/2012   ESOPHAGOGASTRODUODENOSCOPY (EGD) WITH PROPOFOL  N/A 10/03/2016   Procedure: ESOPHAGOGASTRODUODENOSCOPY (EGD) WITH PROPOFOL;  Surgeon: Christena Deem, MD;  Location: [facility] ENDOSCOPY;  Service: Endoscopy;  Laterality: N/A;   HYSTEROSCOPY  2011, 10/2010   OOPHORECTOMY     REVERSE SHOULDER ARTHROPLASTY Right 06/17/2019   Procedure: REVERSE SHOULDER ARTHROPLASTY;  Surgeon: Christena Flake, MD;  Location: [facility] ORS;  Service: Orthopedics;  Laterality: Right;   REVERSE SHOULDER ARTHROPLASTY Left 03/13/2023   Procedure: REVERSE SHOULDER ARTHROPLASTY;  Surgeon: Christena Flake, MD;  Location: [facility] ORS;  Service: Orthopedics;  Laterality: Left;   ROBOTIC ASSISTED LAPAROSCOPIC SACROCOLPOPEXY N/A 06/21/2022   Procedure: XI ROBOTIC ASSISTED LAPAROSCOPIC SACROCOLPOPEXY;  Surgeon: Crist Fat, MD;  Location: [facility] ORS;  Service: Urology;  Laterality: N/A;  240 MINUTES   TONSILLECTOMY     TOTAL SHOULDER REPLACEMENT Right    TRIGGER FINGER RELEASE Bilateral    TUBAL LIGATION     VAGINAL HYSTERECTOMY     midurethral sling   Family History  Problem Relation Age of Onset   Hypertension Mother    Stroke Mother    Diabetes Mother    Heart disease Father    Hypercholesterolemia Father    Hypertension Father    Stroke Father    Heart attack Father    Breast cancer Neg Hx    Colon cancer Neg Hx    Thyroid disease Neg Hx    Social History   Socioeconomic History   Marital status: Widowed    Spouse name: Not on file   Number of children: 2   Years of education: Not on file   Highest education level: Not on file  Occupational History   Not on file  Tobacco Use   Smoking status: Never    Passive exposure: Never   Smokeless tobacco: Never  Vaping Use   Vaping status: Never Used  Substance and Sexual Activity   Alcohol use: Not Currently   Drug use: Never   Sexual activity: Yes    Birth control/protection: Post-menopausal  Other Topics Concern   Not on file  Social History Narrative   Lives alone   Social Drivers of Health   Financial  Resource Strain: Low Risk  (11/20/2022)   Overall Financial Resource Strain (CARDIA)    Difficulty of Paying Living Expenses: Not hard at all  Food Insecurity: No Food Insecurity (11/20/2022)   Hunger Vital Sign    Worried About Running Out of Food in the Last Year: Never true    Ran Out of Food in the Last Year: Never true  Transportation Needs: No Transportation Needs (11/20/2022)   PRAPARE - Administrator, Civil Service (Medical): No    Lack of Transportation (Non-Medical): No  Physical Activity: Insufficiently Active (11/20/2022)   Exercise Vital Sign    Days of Exercise per Week: 2 days    Minutes of Exercise per Session: 70 min  Stress: No Stress Concern Present (11/20/2022)   [place]  Institute of Occupational Health - Occupational Stress Questionnaire    Feeling of Stress : Not at all  Social Connections: Moderately Integrated (11/20/2022)   Social Connection and Isolation Panel [NHANES]    Frequency of Communication with Friends and Family: More than three times a week    Frequency of Social Gatherings with Friends and Family: More than three times a week    Attends Religious Services: More than 4 times per year    Active Member of [employer] or Organizations: Yes    Attends Banker Meetings: Not on file    Marital Status: Widowed     Review of Systems  Constitutional:  Negative for appetite change and unexpected weight change.  HENT:  Negative for congestion and sinus pressure.   Respiratory:  Negative for cough and chest tightness.        Breathing stable.   Cardiovascular:  Negative for chest pain, palpitations and leg swelling.  Gastrointestinal:  Negative for abdominal pain, diarrhea, nausea and vomiting.  Genitourinary:  Negative for difficulty urinating and dysuria.  Musculoskeletal:  Negative for joint swelling and myalgias.  Skin:  Negative for color change and rash.  Neurological:  Negative for dizziness and headaches.  Psychiatric/Behavioral:   Negative for agitation and dysphoric mood.        Objective:     BP 118/70   Pulse 78   Temp 98 F (36.7 C)   Resp 16   Ht 5\' 1"  (1.549 m)   Wt 166 lb (75.3 kg)   LMP 10/18/2012   SpO2 98%   BMI 31.37 kg/m  Wt Readings from Last 3 Encounters:  11/06/23 166 lb (75.3 kg)  08/08/23 164 lb (74.4 kg)  08/02/23 164 lb 3.2 oz (74.5 kg)    Physical Exam Vitals reviewed.  Constitutional:      General: She is not in acute distress.    Appearance: Normal appearance.  HENT:     Head: Normocephalic and atraumatic.     Right Ear: External ear normal.     Left Ear: External ear normal.     Mouth/Throat:     Pharynx: No oropharyngeal exudate or posterior oropharyngeal erythema.  Eyes:     General: No scleral icterus.       Right eye: No discharge.        Left eye: No discharge.     Conjunctiva/sclera: Conjunctivae normal.  Neck:     Thyroid: No thyromegaly.  Cardiovascular:     Rate and Rhythm: Normal rate and regular rhythm.  Pulmonary:     Effort: No respiratory distress.     Breath sounds: Normal breath sounds. No wheezing.  Abdominal:     General: Bowel sounds are normal.     Palpations: Abdomen is soft.     Tenderness: There is no abdominal tenderness.  Musculoskeletal:        General: No swelling or tenderness.     Cervical back: Neck supple. No tenderness.  Lymphadenopathy:     Cervical: No cervical adenopathy.  Skin:    Findings: No erythema or rash.  Neurological:     Mental Status: She is alert.  Psychiatric:        Mood and Affect: Mood normal.        Behavior: Behavior normal.         Outpatient Encounter Medications as of 11/06/2023  Medication Sig   acetaminophen (TYLENOL) 500 MG tablet Take 500-1,000 mg by mouth every 6 (six) hours as needed for moderate  pain or headache.   Calcium Carb-Cholecalciferol (CALCIUM-VITAMIN D3 PO) Take 1 tablet by mouth daily. 600 mg  20 mcg   calcium carbonate (TUMS - DOSED IN MG ELEMENTAL CALCIUM) 500 MG chewable  tablet Chew 1 tablet by mouth daily as needed for indigestion or heartburn.   carvedilol (COREG) 12.5 MG tablet Take 1 tablet (12.5 mg total) by mouth 2 (two) times daily with a meal.   Cholecalciferol (D3-1000) 25 MCG (1000 UT) capsule Take 1,000 Units by mouth daily.   docusate sodium (COLACE) 100 MG capsule Take 1 capsule (100 mg total) by mouth daily as needed.   estradiol (ESTRACE VAGINAL) 0.1 MG/GM vaginal cream Place 1 Applicatorful vaginally 3 (three) times a week. Use 1 small dolyp of cream on tip of index finger and swap the inside of the vagina (Patient taking differently: Place 1 Applicatorful vaginally as needed. Use 1 small dolyp of cream on tip of index finger and swap the inside of the vagina)   fexofenadine (ALLEGRA) 180 MG tablet Take 180 mg by mouth at bedtime.    furosemide (LASIX) 20 MG tablet Take 1 tablet (20 mg total) by mouth daily as needed (only when home).   meloxicam (MOBIC) 15 MG tablet TAKE 1 TABLET (15 MG TOTAL) BY MOUTH DAILY.   methimazole (TAPAZOLE) 5 MG tablet Take 0.5 tablets (2.5 mg total) by mouth daily.   rosuvastatin (CRESTOR) 10 MG tablet TAKE 1 TABLET BY MOUTH EVERY DAY   SUMAtriptan (IMITREX) 100 MG tablet TAKE 1 TABLET BY MOUTH ONCE. MAY REPEAT IN 2 HOURS IF HEADACHE PERSISTS OR RECURS.   triamcinolone (NASACORT) 55 MCG/ACT AERO nasal inhaler Place 1 spray into the nose daily as needed (allergies).   zinc gluconate 50 MG tablet Take 50 mg by mouth daily.   zonisamide (ZONEGRAN) 100 MG capsule TAKE 1 CAPSULE BY MOUTH EVERY DAY   No facility-administered encounter medications on file as of 11/06/2023.     Lab Results  Component Value Date   WBC 7.6 03/05/2023   HGB 13.3 03/05/2023   HCT 41.4 03/05/2023   PLT 208.0 03/05/2023   GLUCOSE 106 (H) 11/01/2023   CHOL 137 11/01/2023   TRIG 151.0 (H) 11/01/2023   HDL 50.50 11/01/2023   LDLCALC 56 11/01/2023   ALT 14 11/01/2023   AST 18 11/01/2023   NA 142 11/01/2023   K 4.7 11/01/2023   CL 109  11/01/2023   CREATININE 0.78 11/01/2023   BUN 18 11/01/2023   CO2 27 11/01/2023   TSH 0.93 08/02/2023   INR 0.98 11/26/2015   HGBA1C 6.0 11/01/2023    DG Shoulder Left Port Result Date: 03/13/2023 CLINICAL DATA:  Status post reverse arthroplasty of shoulder, left EXAM: LEFT SHOULDER COMPARISON:  None Available. FINDINGS: Status post left reverse shoulder arthroplasty. No definite evidence of acute fracture or dislocation on limited positioning. Expected postoperative swelling. Alone IMPRESSION: Postoperative changes of left reverse shoulder arthroplasty without evidence of immediate hardware complication. Electronically Signed   By: Feliberto Harts M.D.   On: 03/13/2023 14:15   [place] OR NERVE BLOCK-IMAGE ONLY [facility]) Result Date: 03/13/2023 There is no interpretation for this exam.  This order is for images obtained during a surgical procedure.  Please See "Surgeries" Tab for more information regarding the procedure.       Assessment & Plan:  Encounter for screening mammogram for malignant neoplasm of breast -     3D Screening Mammogram, Left and Right; Future  Hypercholesterolemia Assessment & Plan: On crestor.  Low  cholesterol diet and exercise.  Follow lipid panel and liver function tests.   Lab Results  Component Value Date   CHOL 137 11/01/2023   HDL 50.50 11/01/2023   LDLCALC 56 11/01/2023   TRIG 151.0 (H) 11/01/2023   CHOLHDL 3 11/01/2023     Orders: -     Basic metabolic panel; Future -     Hepatic function panel; Future -     Lipid panel; Future  Hyperglycemia Assessment & Plan: Low carb diet and exercise. Discussed decreasing bread and sweet tea. Follow met b and A1c.  Lab Results  Component Value Date   HGBA1C 6.0 11/01/2023     Orders: -     Hemoglobin A1c; Future  Leukocytosis, unspecified type -     CBC with Differential/Platelet; Future  SVT (supraventricular tachycardia) (HCC) Assessment & Plan: On coreg. Stable.    Stress Assessment &  Plan: Doing well. Follow.    Status post reverse total shoulder replacement, right Assessment & Plan: S/p recent shoulder surgery. Completed PT. Had f/u with ortho 09/10/23 - doing well.    Hyperthyroidism Assessment & Plan: Had f/u with endocrinology 08/02/23 - TFTs normal. Recommended to continue methimazole.    History of migraine headaches Assessment & Plan: Worked up in the headache clinic.  Stable.     History of colonic polyps Assessment & Plan: Colonoscopy 02/2017.  Recommended f/u in 5 years.  Had f/u colonoscopy 02/21/21.    Chronic systolic heart failure (HCC) Assessment & Plan: Continue coreg and lasix prn. No evidence of volume overload.  Follow.    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HCC) Assessment & Plan: No evidence of volume overload.  On coreg and lasix prn.  Follow. Follow metabolic panel.    Gastroesophageal reflux disease, unspecified whether esophagitis present Assessment & Plan: Upper symptoms controlled on protonix.        Dale Esperanza, MD

## 2023-11-12 ENCOUNTER — Encounter: Payer: Self-pay | Admitting: Internal Medicine

## 2023-11-12 NOTE — Assessment & Plan Note (Signed)
 Had f/u with endocrinology 08/02/23 - TFTs normal. Recommended to continue methimazole.

## 2023-11-12 NOTE — Assessment & Plan Note (Signed)
Upper symptoms controlled on protonix.  

## 2023-11-12 NOTE — Assessment & Plan Note (Signed)
 Low carb diet and exercise. Discussed decreasing bread and sweet tea. Follow met b and A1c.  Lab Results  Component Value Date   HGBA1C 6.0 11/01/2023

## 2023-11-12 NOTE — Assessment & Plan Note (Signed)
Colonoscopy 02/2017.  Recommended f/u in 5 years.  Had f/u colonoscopy 02/21/21.  

## 2023-11-12 NOTE — Assessment & Plan Note (Signed)
No evidence of volume overload.  On coreg and lasix prn.  Follow. Follow metabolic panel.

## 2023-11-12 NOTE — Assessment & Plan Note (Signed)
Worked up in the headache clinic.  Stable.

## 2023-11-12 NOTE — Assessment & Plan Note (Signed)
Doing well.  Follow.  

## 2023-11-12 NOTE — Assessment & Plan Note (Signed)
 S/p recent shoulder surgery. Completed PT. Had f/u with ortho 09/10/23 - doing well.

## 2023-11-12 NOTE — Assessment & Plan Note (Signed)
Continue coreg and lasix prn. No evidence of volume overload.  Follow.

## 2023-11-12 NOTE — Assessment & Plan Note (Signed)
 On crestor.  Low cholesterol diet and exercise.  Follow lipid panel and liver function tests.   Lab Results  Component Value Date   CHOL 137 11/01/2023   HDL 50.50 11/01/2023   LDLCALC 56 11/01/2023   TRIG 151.0 (H) 11/01/2023   CHOLHDL 3 11/01/2023

## 2023-11-12 NOTE — Assessment & Plan Note (Signed)
On coreg.  Stable.

## 2023-11-21 ENCOUNTER — Ambulatory Visit: Payer: Medicare Other | Admitting: *Deleted

## 2023-11-21 VITALS — Ht 61.0 in | Wt 160.0 lb

## 2023-11-21 DIAGNOSIS — Z0000 Encounter for general adult medical examination without abnormal findings: Secondary | ICD-10-CM | POA: Diagnosis not present

## 2023-11-21 DIAGNOSIS — Z780 Asymptomatic menopausal state: Secondary | ICD-10-CM

## 2023-11-21 NOTE — Progress Notes (Signed)
 Subjective:   Kristi Greer is a 73 y.o. who presents for a Medicare Wellness preventive visit.  Visit Complete: Virtual I connected with  Kristi Greer on 11/21/23 by a audio enabled telemedicine application and verified that I am speaking with the correct person using two identifiers.  Patient Location: Home  Provider Location: Office/Clinic  I discussed the limitations of evaluation and management by telemedicine. The patient expressed understanding and agreed to proceed.  Vital Signs: Because this visit was a virtual/telehealth visit, some criteria may be missing or patient reported. Any vitals not documented were not able to be obtained and vitals that have been documented are patient reported.  VideoDeclined- This patient declined Librarian, academic. Therefore the visit was completed with audio only.  Persons Participating in Visit: Patient.  AWV Questionnaire: No: Patient Medicare AWV questionnaire was not completed prior to this visit.  Cardiac Risk Factors include: advanced age (>2men, >37 women);dyslipidemia;hypertension;obesity (BMI >30kg/m2)     Objective:    Today's Vitals   11/21/23 1427  Weight: 160 lb (72.6 kg)  Height: 5\' 1"  (1.549 m)   Body mass index is 30.23 kg/m.     11/21/2023    2:41 PM 03/13/2023    6:18 AM 03/06/2023    2:36 PM 11/20/2022    2:48 PM 06/21/2022    5:00 PM 06/13/2022   11:14 AM 04/21/2022   11:34 AM  Advanced Directives  Does Patient Have a Medical Advance Directive? Yes Yes No Yes Yes Yes Yes  Type of Estate agent of [place];Living will Living will;Healthcare Power of [employer] Power of [place];Living will Living will Healthcare Power of [place];Living will Healthcare Power of [place];Living will  Does patient want to make changes to medical advance directive?    No - Patient declined No - Patient declined  No - Patient declined  Copy of Healthcare Power of  Attorney in Chart? No - copy requested   No - copy requested   No - copy requested  Would patient like information on creating a medical advance directive?       No - Patient declined    Current Medications (verified) Outpatient Encounter Medications as of 11/21/2023  Medication Sig   acetaminophen (TYLENOL) 500 MG tablet Take 500-1,000 mg by mouth every 6 (six) hours as needed for moderate pain or headache.   Calcium Carb-Cholecalciferol (CALCIUM-VITAMIN D3 PO) Take 1 tablet by mouth daily. 600 mg  20 mcg   calcium carbonate (TUMS - DOSED IN MG ELEMENTAL CALCIUM) 500 MG chewable tablet Chew 1 tablet by mouth daily as needed for indigestion or heartburn.   carvedilol (COREG) 12.5 MG tablet Take 1 tablet (12.5 mg total) by mouth 2 (two) times daily with a meal.   Cholecalciferol (D3-1000) 25 MCG (1000 UT) capsule Take 1,000 Units by mouth daily.   docusate sodium (COLACE) 100 MG capsule Take 1 capsule (100 mg total) by mouth daily as needed.   estradiol (ESTRACE VAGINAL) 0.1 MG/GM vaginal cream Place 1 Applicatorful vaginally 3 (three) times a week. Use 1 small dolyp of cream on tip of index finger and swap the inside of the vagina (Patient taking differently: Place 1 Applicatorful vaginally as needed. Use 1 small dolyp of cream on tip of index finger and swap the inside of the vagina)   fexofenadine (ALLEGRA) 180 MG tablet Take 180 mg by mouth at bedtime.    furosemide (LASIX) 20 MG tablet Take 1 tablet (20 mg total) by  mouth daily as needed (only when home).   meloxicam (MOBIC) 15 MG tablet TAKE 1 TABLET (15 MG TOTAL) BY MOUTH DAILY.   methimazole (TAPAZOLE) 5 MG tablet Take 0.5 tablets (2.5 mg total) by mouth daily.   rosuvastatin (CRESTOR) 10 MG tablet TAKE 1 TABLET BY MOUTH EVERY DAY   SUMAtriptan (IMITREX) 100 MG tablet TAKE 1 TABLET BY MOUTH ONCE. MAY REPEAT IN 2 HOURS IF HEADACHE PERSISTS OR RECURS.   triamcinolone (NASACORT) 55 MCG/ACT AERO nasal inhaler Place 1 spray into the nose daily  as needed (allergies).   zinc gluconate 50 MG tablet Take 50 mg by mouth daily.   zonisamide (ZONEGRAN) 100 MG capsule TAKE 1 CAPSULE BY MOUTH EVERY DAY   No facility-administered encounter medications on file as of 11/21/2023.    Allergies (verified) Patient has no known allergies.   History: Past Medical History:  Diagnosis Date   Allergy    Anemia    Arthritis    Atrial fibrillation (HCC)    Cardiomyopathy, nonischemic (HCC)    a.) felt to be tachycardia mediated NICM; b.) TTE 09/01/2015: EF 35-40%; c.) [facility] 11/26/2015: ED 30-35%, norm cors, mPA 16, PCWP 7, CO 4.7, CI 2.8; d.) TTE 01/31/2016: EF 40-45%; e.) cMRI 04/27/2016: EF 48%; f.) TTE 12/06/2016: EF 55-60%; g.) TTE 03/25/2018: EF 50-55%   Carpal tunnel syndrome, bilateral    Chronic back pain    Congestive heart failure (HCC)    a.) TTE 09/01/2015: EF 35-40%, mod MR; b.) R/LHC 11/26/2015: EF 30-35%; c.) TTE 01/31/2016: EF 40-45%, mild LVH, diffuse HK, triv MR/TR; d.) TTE 12/06/2016: EF 55-60%, mild MAC, G1DD; e.) TTE 03/25/2018: EF 50-55%, triv PR, G2DD.   Diverticulosis    DOE (dyspnea on exertion)    Dyspnea on exertion    Dysrhythmia    Environmental and seasonal allergies    Female cystocele    GERD (gastroesophageal reflux disease)    Hiatal hernia    History of complications due to general anesthesia    a.) PONV; b.) delayed emergence   Hyperlipidemia    Hypertension    Hyperthyroidism    Migraines    Nontraumatic complete tear of left rotator cuff    NSVT (nonsustained ventricular tachycardia) (HCC) 11/16/2015   a.) felt to be related to NICM in the setting of increased stressors, influenza A, hypotension Tx'd  with IVF bolus   Overactive detrusor    Personal history of colonic polyps    PONV (postoperative nausea and vomiting)    Septic shock (HCC) 11/14/2015   a.) admitted 11/14/2015 - 11/18/2015 for urosepsis + influenza + viral gastroenteritis (+ N/V/D). SBP unpon arrival was in the 60s --> improved to  80s after 4L IVFs --> started on pressors and admitted to ICU.   Skin cancer, basal cell    Squamous cell skin cancer    SUI (stress urinary incontinence, female)    SVT (supraventricular tachycardia) (HCC) 08/31/2015   a.) rates as high as 150 bpm; b.) EP feels as if event was "long RP tachycardia"   Tendinitis of left rotator cuff    Uterovaginal prolapse    Vaginal atrophy    Past Surgical History:  Procedure Laterality Date   ANTERIOR AND POSTERIOR VAGINAL REPAIR W/ SACROSPINOUS LIGAMENT SUSPENSION  10/18/2012   CARDIAC CATHETERIZATION N/A 11/26/2015   Procedure: Right/Left Heart Cath and Coronary Angiography;  Surgeon: Dolores Patty, MD;  [facility] INVASIVE CV LAB;  Service: Cardiovascular;  Laterality: N/A;   CARPAL TUNNEL RELEASE Right  CATARACT EXTRACTION Bilateral    CHOLECYSTECTOMY  2006   COLONOSCOPY WITH PROPOFOL N/A 03/19/2017   Procedure: COLONOSCOPY WITH PROPOFOL;  Surgeon: Christena Deem, MD;  Location: [facility] ENDOSCOPY;  Service: Endoscopy;  Laterality: N/A;   COLPOPEXY  10/18/2012   CYSTOSCOPY WITH BIOPSY N/A 04/21/2022   Procedure: CYSTOSCOPY WITH BLADDER BIOPSY;  Surgeon: Sondra Come, MD;  Location: [facility] ORS;  Service: Urology;  Laterality: N/A;   CYSTOURETHROSCOPY  10/18/2012   ESOPHAGOGASTRODUODENOSCOPY (EGD) WITH PROPOFOL N/A 10/03/2016   Procedure: ESOPHAGOGASTRODUODENOSCOPY (EGD) WITH PROPOFOL;  Surgeon: Christena Deem, MD;  Location: [facility] ENDOSCOPY;  Service: Endoscopy;  Laterality: N/A;   HYSTEROSCOPY  2011, 10/2010   OOPHORECTOMY     REVERSE SHOULDER ARTHROPLASTY Right 06/17/2019   Procedure: REVERSE SHOULDER ARTHROPLASTY;  Surgeon: Christena Flake, MD;  Location: [facility] ORS;  Service: Orthopedics;  Laterality: Right;   REVERSE SHOULDER ARTHROPLASTY Left 03/13/2023   Procedure: REVERSE SHOULDER ARTHROPLASTY;  Surgeon: Christena Flake, MD;  Location: [facility] ORS;  Service: Orthopedics;  Laterality: Left;   ROBOTIC ASSISTED LAPAROSCOPIC  SACROCOLPOPEXY N/A 06/21/2022   Procedure: XI ROBOTIC ASSISTED LAPAROSCOPIC SACROCOLPOPEXY;  Surgeon: Crist Fat, MD;  Location: [facility] ORS;  Service: Urology;  Laterality: N/A;  240 MINUTES   TONSILLECTOMY     TOTAL SHOULDER REPLACEMENT Right    TRIGGER FINGER RELEASE Bilateral    TUBAL LIGATION     VAGINAL HYSTERECTOMY     midurethral sling   Family History  Problem Relation Age of Onset   Hypertension Mother    Stroke Mother    Diabetes Mother    Heart disease Father    Hypercholesterolemia Father    Hypertension Father    Stroke Father    Heart attack Father    Breast cancer Neg Hx    Colon cancer Neg Hx    Thyroid disease Neg Hx    Social History   Socioeconomic History   Marital status: Widowed    Spouse name: Not on file   Number of children: 2   Years of education: Not on file   Highest education level: Not on file  Occupational History   Not on file  Tobacco Use   Smoking status: Never    Passive exposure: Never   Smokeless tobacco: Never  Vaping Use   Vaping status: Never Used  Substance and Sexual Activity   Alcohol use: Not Currently   Drug use: Never   Sexual activity: Yes    Birth control/protection: Post-menopausal  Other Topics Concern   Not on file  Social History Narrative   Lives alone   Social Drivers of Health   Financial Resource Strain: Low Risk  (11/21/2023)   Overall Financial Resource Strain (CARDIA)    Difficulty of Paying Living Expenses: Not hard at all  Food Insecurity: No Food Insecurity (11/21/2023)   Hunger Vital Sign    Worried About Running Out of Food in the Last Year: Never true    Ran Out of Food in the Last Year: Never true  Transportation Needs: No Transportation Needs (11/21/2023)   PRAPARE - Administrator, Civil Service (Medical): No    Lack of Transportation (Non-Medical): No  Physical Activity: Sufficiently Active (11/21/2023)   Exercise Vital Sign    Days of Exercise per Week: 5 days    Minutes  of Exercise per Session: 60 min  Stress: No Stress Concern Present (11/21/2023)   [employer] of Occupational Health - Occupational Stress Questionnaire  Feeling of Stress : Not at all  Social Connections: Moderately Isolated (11/21/2023)   Social Connection and Isolation Panel [NHANES]    Frequency of Communication with Friends and Family: More than three times a week    Frequency of Social Gatherings with Friends and Family: More than three times a week    Attends Religious Services: More than 4 times per year    Active Member of [employer] or Organizations: No    Attends Banker Meetings: Never    Marital Status: Widowed    Tobacco Counseling Counseling given: Not Answered    Clinical Intake:  Pre-visit preparation completed: Yes  Pain : No/denies pain     BMI - recorded: 30.23 Nutritional Status: BMI > 30  Obese Nutritional Risks: None Diabetes: No  Lab Results  Component Value Date   HGBA1C 6.0 11/01/2023   HGBA1C 5.8 06/11/2023   HGBA1C 5.9 02/08/2023     How often do you need to have someone help you when you read instructions, pamphlets, or other written materials from your doctor or pharmacy?: 1 - Never  Interpreter Needed?: No  Information entered by :: R. Nalaya Wojdyla LPN   Activities of Daily Living     11/21/2023    2:28 PM 03/06/2023    2:39 PM  In your present state of health, do you have any difficulty performing the following activities:  Hearing? 1   Comment wears aids   Vision? 0   Comment readers   Difficulty concentrating or making decisions? 0   Walking or climbing stairs? 0   Dressing or bathing? 0   Doing errands, shopping? 0 0  Preparing Food and eating ? N   Using the Toilet? N   In the past six months, have you accidently leaked urine? Y   Do you have problems with loss of bowel control? N   Managing your Medications? N   Managing your Finances? N   Housekeeping or managing your Housekeeping? N     Patient Care  Team: Dale [place], MD as PCP - General (Internal Medicine) Antonieta Iba, MD as PCP - Cardiology (Cardiology) Delma Freeze, FNP as Nurse Practitioner (Family Medicine) Marcina Millard, MD as Consulting Physician (Cardiology) Abisogun, Albin Felling, MD as Consulting Physician (Endocrinology)  Indicate any recent Medical Services you may have received from other than [facility] providers in the past year (date may be approximate).     Assessment:   This is a routine wellness examination for Betsabe.  Hearing/Vision screen Hearing Screening - Comments:: Wears aids Vision Screening - Comments:: readers   Goals Addressed             This Visit's Progress    Patient Stated       Wants to work on her diet and exercise and continue to stay active       Depression Screen     11/21/2023    2:35 PM 06/14/2023   10:29 AM 11/20/2022    2:39 PM 06/12/2022   10:31 AM 02/07/2022    4:23 PM 01/18/2022   11:04 AM 11/14/2021   10:56 AM  PHQ 2/9 Scores  PHQ - 2 Score 0 0 0 0 0 0 0  PHQ- 9 Score 0          Fall Risk     11/21/2023    2:31 PM 06/14/2023   10:29 AM 11/20/2022    2:50 PM 06/12/2022   10:31 AM 02/07/2022    4:23 PM  Fall Risk   Falls in the past year? 0 0 0 1 0  Number falls in past yr: 0 0  1   Injury with Fall? 0 0  0   Risk for fall due to : No Fall Risks No Fall Risks  History of fall(s) No Fall Risks  Follow up Falls prevention discussed;Falls evaluation completed Falls evaluation completed Falls evaluation completed;Falls prevention discussed Falls evaluation completed Falls evaluation completed    MEDICARE RISK AT HOME:  Medicare Risk at Home Any stairs in or around the home?: Yes If so, are there any without handrails?: No Home free of loose throw rugs in walkways, pet beds, electrical cords, etc?: Yes Adequate lighting in your home to reduce risk of falls?: Yes Life alert?: No Use of a cane, walker or w/c?: No Grab bars in the bathroom?:  Yes Shower chair or bench in shower?: Yes Elevated toilet seat or a handicapped toilet?: Yes  TIMED UP AND GO:  Was the test performed?  No  Cognitive Function: 6CIT completed        11/21/2023    2:42 PM 11/20/2022    2:50 PM 04/02/2019   11:51 AM  6CIT Screen  What Year? 0 points 0 points 0 points  What month? 0 points 0 points 0 points  What time? 0 points 0 points 0 points  Count back from 20 0 points 0 points 0 points  Months in reverse 0 points 0 points 0 points  Repeat phrase 0 points 0 points 0 points  Total Score 0 points 0 points 0 points    Immunizations Immunization History  Administered Date(s) Administered   Fluad Quad(high Dose 65+) 04/28/2019, 09/29/2020, 06/07/2021, 06/12/2022   Fluad Trivalent(High Dose 65+) 06/14/2023   Influenza Split 05/27/2012, 05/26/2013, 05/15/2014   Influenza, High Dose Seasonal PF 05/01/2018   Influenza-Unspecified 05/26/2015   Pneumococcal Conjugate-13 05/13/2019   Pneumococcal Polysaccharide-23 01/28/2021   Zoster Recombinant(Shingrix) 10/11/2021, 12/14/2021    Screening Tests Health Maintenance  Topic Date Due   DTaP/Tdap/Td (1 - Tdap) Never done   Medicare Annual Wellness (AWV)  11/20/2023   MAMMOGRAM  11/23/2023   Colonoscopy  02/22/2031   Pneumonia Vaccine 38+ Years old  Completed   INFLUENZA VACCINE  Completed   DEXA SCAN  Completed   Hepatitis C Screening  Completed   Zoster Vaccines- Shingrix  Completed   HPV VACCINES  Aged Out   COVID-19 Vaccine  Discontinued    Health Maintenance  Health Maintenance Due  Topic Date Due   DTaP/Tdap/Td (1 - Tdap) Never done   Medicare Annual Wellness (AWV)  11/20/2023   Health Maintenance Items Addressed: DEXA ordered, Discussed with patient the need to update her tetanus vaccine.   Additional Screening:  Vision Screening: Recommended annual ophthalmology exams for early detection of glaucoma and other disorders of the eye. Not up to date. [facility]  Patient will call  and schedule an appointment.  Dental Screening: Recommended annual dental exams for proper oral hygiene  Community Resource Referral / Chronic Care Management: CRR required this visit?  No   CCM required this visit?  No     Plan:     I have personally reviewed and noted the following in the patient's chart:   Medical and social history Use of alcohol, tobacco or illicit drugs  Current medications and supplements including opioid prescriptions. Patient is not currently taking opioid prescriptions. Functional ability and status Nutritional status Physical activity Advanced directives List of other physicians Hospitalizations,  surgeries, and ER visits in previous 12 months Vitals Screenings to include cognitive, depression, and falls Referrals and appointments  In addition, I have reviewed and discussed with patient certain preventive protocols, quality metrics, and best practice recommendations. A written personalized care plan for preventive services as well as general preventive health recommendations were provided to patient.     Sydell Axon, LPN   1/61/0960   After Visit Summary: (MyChart) Due to this being a telephonic visit, the after visit summary with patients personalized plan was offered to patient via MyChart   Notes: Nothing significant to report at this time.

## 2023-11-21 NOTE — Patient Instructions (Signed)
 Kristi Greer , Thank you for taking time to come for your Medicare Wellness Visit. I appreciate your ongoing commitment to your health goals. Please review the following plan we discussed and let me know if I can assist you in the future.   Referrals/Orders/Follow-Ups/Clinician Recommendations: Bone Density ordered. Remember to update your tetanus vaccine. You have an order for:  []   2D Mammogram  []   3D Mammogram  [x]   Bone Density     Please call for appointment:  [facility]  [address] [place] [address] [phone]    Make sure to wear two-piece clothing.  No lotions, powders, or deodorants the day of the appointment. Make sure to bring picture ID and insurance card.  Bring list of medications you are currently taking including any supplements.    This is a list of the screening recommended for you and due dates:  Health Maintenance  Topic Date Due   DTaP/Tdap/Td vaccine (1 - Tdap) Never done   Mammogram  11/23/2023   Medicare Annual Wellness Visit  11/20/2024   Colon Cancer Screening  02/22/2031   Pneumonia Vaccine  Completed   Flu Shot  Completed   DEXA scan (bone density measurement)  Completed   Hepatitis C Screening  Completed   Zoster (Shingles) Vaccine  Completed   HPV Vaccine  Aged Out   COVID-19 Vaccine  Discontinued    Advanced directives: (Copy Requested) Please bring a copy of your health care power of attorney and living will to the office to be added to your chart at your convenience. You can mail to [facility] [address] [place], [place] [address] or email to [email or web address]  Next Medicare Annual Wellness Visit scheduled for next year: Yes 11/24/24 @ 2:20

## 2023-11-27 ENCOUNTER — Encounter

## 2023-11-27 DIAGNOSIS — L570 Actinic keratosis: Secondary | ICD-10-CM | POA: Diagnosis not present

## 2023-11-27 DIAGNOSIS — Z872 Personal history of diseases of the skin and subcutaneous tissue: Secondary | ICD-10-CM | POA: Diagnosis not present

## 2023-11-27 DIAGNOSIS — L578 Other skin changes due to chronic exposure to nonionizing radiation: Secondary | ICD-10-CM | POA: Diagnosis not present

## 2023-11-27 DIAGNOSIS — Z859 Personal history of malignant neoplasm, unspecified: Secondary | ICD-10-CM | POA: Diagnosis not present

## 2023-11-27 DIAGNOSIS — Z85828 Personal history of other malignant neoplasm of skin: Secondary | ICD-10-CM | POA: Diagnosis not present

## 2023-11-27 DIAGNOSIS — Z86018 Personal history of other benign neoplasm: Secondary | ICD-10-CM | POA: Diagnosis not present

## 2023-12-20 ENCOUNTER — Ambulatory Visit
Admission: RE | Admit: 2023-12-20 | Discharge: 2023-12-20 | Disposition: A | Discharge status: Home / Self Care | Source: Ambulatory Visit | Attending: Internal Medicine | Admitting: Internal Medicine

## 2023-12-20 DIAGNOSIS — Z1231 Encounter for screening mammogram for malignant neoplasm of breast: Secondary | ICD-10-CM | POA: Diagnosis not present

## 2023-12-20 DIAGNOSIS — Z780 Asymptomatic menopausal state: Secondary | ICD-10-CM

## 2023-12-20 DIAGNOSIS — M8589 Other specified disorders of bone density and structure, multiple sites: Secondary | ICD-10-CM | POA: Diagnosis not present

## 2023-12-23 NOTE — Progress Notes (Signed)
 Subjective:    [name], female    DOB: 21-Nov-1950, 73 y.o.   MRN: [id number]  Patient here for  Chief Complaint  Patient presents with   Osteoporosis    HPI Here as a work in to discuss results of recent bone density and to discuss treatment options. Bone density revealed osteopenia with T score -2.2 right femoral neck. Major osteoporotic fracture 21.1% and hip fracture 4.9%. discussed given bone density results and fracture risk, discussed treatment options including oral bisphosphonates and IV reclast. Discussed continuing weight beating exercises and continuing calcium  and vitamin D . She stays active. No chest pain or sob reported. Bowels stable.    Past Medical History:  Diagnosis Date   Allergy    Anemia    Arthritis    Atrial fibrillation (HCC)    Cardiomyopathy, nonischemic (HCC)    a.) felt to be tachycardia mediated NICM; b.) TTE 09/01/2015: EF 35-40%; c.) [facility] 11/26/2015: ED 30-35%, norm cors, mPA 16, PCWP 7, CO 4.7, CI 2.8; d.) TTE 01/31/2016: EF 40-45%; e.) cMRI 04/27/2016: EF 48%; f.) TTE 12/06/2016: EF 55-60%; g.) TTE 03/25/2018: EF 50-55%   Carpal tunnel syndrome, bilateral    Chronic back pain    Congestive heart failure (HCC)    a.) TTE 09/01/2015: EF 35-40%, mod MR; b.) R/LHC 11/26/2015: EF 30-35%; c.) TTE 01/31/2016: EF 40-45%, mild LVH, diffuse HK, triv MR/TR; d.) TTE 12/06/2016: EF 55-60%, mild MAC, G1DD; e.) TTE 03/25/2018: EF 50-55%, triv PR, G2DD.   Diverticulosis    DOE (dyspnea on exertion)    Dyspnea on exertion    Dysrhythmia    Environmental and seasonal allergies    Female cystocele    GERD (gastroesophageal reflux disease)    Hiatal hernia    History of complications due to general anesthesia    a.) PONV; b.) delayed emergence   Hyperlipidemia    Hypertension    Hyperthyroidism    Migraines    Nontraumatic complete tear of left rotator cuff    NSVT (nonsustained ventricular tachycardia) (HCC) 11/16/2015   a.) felt to  be related to NICM in the setting of increased stressors, influenza A, hypotension Tx'd  with IVF bolus   Overactive detrusor    Personal history of colonic polyps    PONV (postoperative nausea and vomiting)    Septic shock (HCC) 11/14/2015   a.) admitted 11/14/2015 - 11/18/2015 for urosepsis + influenza + viral gastroenteritis (+ N/V/D). SBP unpon arrival was in the 60s --> improved to 80s after 4L IVFs --> started on pressors and admitted to ICU.   Skin cancer, basal cell    Squamous cell skin cancer    SUI (stress urinary incontinence, female)    SVT (supraventricular tachycardia) (HCC) 08/31/2015   a.) rates as high as 150 bpm; b.) EP feels as if event was "long RP tachycardia"   Tendinitis of left rotator cuff    Uterovaginal prolapse    Vaginal atrophy    Past Surgical History:  Procedure Laterality Date   ANTERIOR AND POSTERIOR VAGINAL REPAIR W/ SACROSPINOUS LIGAMENT SUSPENSION  10/18/2012   CARDIAC CATHETERIZATION N/A 11/26/2015   Procedure: Right/Left Heart Cath and Coronary Angiography;  Surgeon: Mardell Shade, MD;  Location: [facility] INVASIVE CV LAB;  Service: Cardiovascular;  Laterality: N/A;   CARPAL TUNNEL RELEASE Right    CATARACT EXTRACTION Bilateral    CHOLECYSTECTOMY  2006   COLONOSCOPY WITH PROPOFOL  N/A 03/19/2017   Procedure: COLONOSCOPY WITH PROPOFOL ;  Surgeon: Deveron Fly,  MD;  Location: [facility] ENDOSCOPY;  Service: Endoscopy;  Laterality: N/A;   COLPOPEXY  10/18/2012   CYSTOSCOPY WITH BIOPSY N/A 04/21/2022   Procedure: CYSTOSCOPY WITH BLADDER BIOPSY;  Surgeon: Lawerence Pressman, MD;  Location: [facility] ORS;  Service: Urology;  Laterality: N/A;   CYSTOURETHROSCOPY  10/18/2012   ESOPHAGOGASTRODUODENOSCOPY (EGD) WITH PROPOFOL  N/A 10/03/2016   Procedure: ESOPHAGOGASTRODUODENOSCOPY (EGD) WITH PROPOFOL ;  Surgeon: Deveron Fly, MD;  Location: [facility] ENDOSCOPY;  Service: Endoscopy;  Laterality: N/A;   HYSTEROSCOPY  2011, 10/2010   OOPHORECTOMY     REVERSE SHOULDER  ARTHROPLASTY Right 06/17/2019   Procedure: REVERSE SHOULDER ARTHROPLASTY;  Surgeon: Elner Hahn, MD;  Location: [facility] ORS;  Service: Orthopedics;  Laterality: Right;   REVERSE SHOULDER ARTHROPLASTY Left 03/13/2023   Procedure: REVERSE SHOULDER ARTHROPLASTY;  Surgeon: Elner Hahn, MD;  Location: [facility] ORS;  Service: Orthopedics;  Laterality: Left;   ROBOTIC ASSISTED LAPAROSCOPIC SACROCOLPOPEXY N/A 06/21/2022   Procedure: XI ROBOTIC ASSISTED LAPAROSCOPIC SACROCOLPOPEXY;  Surgeon: Andrez Banker, MD;  Location: [facility] ORS;  Service: Urology;  Laterality: N/A;  240 MINUTES   TONSILLECTOMY     TOTAL SHOULDER REPLACEMENT Right    TRIGGER FINGER RELEASE Bilateral    TUBAL LIGATION     VAGINAL HYSTERECTOMY     midurethral sling   Family History  Problem Relation Age of Onset   Hypertension Mother    Stroke Mother    Diabetes Mother    Heart disease Father    Hypercholesterolemia Father    Hypertension Father    Stroke Father    Heart attack Father    Breast cancer Neg Hx    Colon cancer Neg Hx    Thyroid  disease Neg Hx    Social History   Socioeconomic History   Marital status: Widowed    Spouse name: Not on file   Number of children: 2   Years of education: Not on file   Highest education level: Not on file  Occupational History   Not on file  Tobacco Use   Smoking status: Never    Passive exposure: Never   Smokeless tobacco: Never  Vaping Use   Vaping status: Never Used  Substance and Sexual Activity   Alcohol  use: Not Currently   Drug use: Never   Sexual activity: Yes    Birth control/protection: Post-menopausal  Other Topics Concern   Not on file  Social History Narrative   Lives alone   Social Drivers of Health   Financial Resource Strain: Low Risk  (11/21/2023)   Overall Financial Resource Strain (CARDIA)    Difficulty of Paying Living Expenses: Not hard at all  Food Insecurity: No Food Insecurity (11/21/2023)   Hunger Vital Sign    Worried About Running Out  of Food in the Last Year: Never true    Ran Out of Food in the Last Year: Never true  Transportation Needs: No Transportation Needs (11/21/2023)   PRAPARE - Administrator, Civil Service (Medical): No    Lack of Transportation (Non-Medical): No  Physical Activity: Sufficiently Active (11/21/2023)   Exercise Vital Sign    Days of Exercise per Week: 5 days    Minutes of Exercise per Session: 60 min  Stress: No Stress Concern Present (11/21/2023)   [employer] of Occupational Health - Occupational Stress Questionnaire    Feeling of Stress : Not at all  Social Connections: Moderately Isolated (11/21/2023)   Social Connection and Isolation Panel [NHANES]    Frequency of Communication  with Friends and Family: More than three times a week    Frequency of Social Gatherings with Friends and Family: More than three times a week    Attends Religious Services: More than 4 times per year    Active Member of [employer] or Organizations: No    Attends Banker Meetings: Never    Marital Status: Widowed     Review of Systems  Constitutional:  Negative for appetite change and unexpected weight change.  HENT:  Negative for congestion and sinus pressure.   Respiratory:  Negative for cough, chest tightness and shortness of breath.   Cardiovascular:  Negative for chest pain, palpitations and leg swelling.  Gastrointestinal:  Negative for abdominal pain, diarrhea, nausea and vomiting.  Genitourinary:  Negative for difficulty urinating and dysuria.  Musculoskeletal:  Negative for joint swelling and myalgias.  Skin:  Negative for color change and rash.  Neurological:  Negative for dizziness and headaches.  Psychiatric/Behavioral:  Negative for agitation and dysphoric mood.        Objective:     BP 118/70   Pulse 63   Temp 98.2 F (36.8 C)   Resp 16   Ht 5\' 1"  (1.549 m)   Wt 163 lb (73.9 kg)   LMP 10/18/2012   SpO2 98%   BMI 30.80 kg/m  Wt Readings from Last 3  Encounters:  12/24/23 163 lb (73.9 kg)  11/21/23 160 lb (72.6 kg)  11/06/23 166 lb (75.3 kg)    Physical Exam Vitals reviewed.  Constitutional:      General: She is not in acute distress.    Appearance: Normal appearance.  HENT:     Head: Normocephalic and atraumatic.     Right Ear: External ear normal.     Left Ear: External ear normal.     Mouth/Throat:     Pharynx: No oropharyngeal exudate or posterior oropharyngeal erythema.  Eyes:     General: No scleral icterus.       Right eye: No discharge.        Left eye: No discharge.     Conjunctiva/sclera: Conjunctivae normal.  Neck:     Thyroid : No thyromegaly.  Cardiovascular:     Rate and Rhythm: Normal rate and regular rhythm.  Pulmonary:     Effort: No respiratory distress.     Breath sounds: Normal breath sounds. No wheezing.  Abdominal:     General: Bowel sounds are normal.     Palpations: Abdomen is soft.     Tenderness: There is no abdominal tenderness.  Musculoskeletal:        General: No swelling or tenderness.     Cervical back: Neck supple. No tenderness.  Lymphadenopathy:     Cervical: No cervical adenopathy.  Skin:    Findings: No erythema or rash.  Neurological:     Mental Status: She is alert.  Psychiatric:        Mood and Affect: Mood normal.        Behavior: Behavior normal.         Outpatient Encounter Medications as of 12/24/2023  Medication Sig   Ascorbic Acid (VITAMIN C) 1000 MG tablet Take 1,000 mg by mouth daily.   acetaminophen  (TYLENOL ) 500 MG tablet Take 500-1,000 mg by mouth every 6 (six) hours as needed for moderate pain or headache.   Calcium  Carb-Cholecalciferol  (CALCIUM -VITAMIN D3 PO) Take 1 tablet by mouth daily. 600 mg  20 mcg   calcium  carbonate (TUMS - DOSED IN MG ELEMENTAL CALCIUM ) 500  MG chewable tablet Chew 1 tablet by mouth daily as needed for indigestion or heartburn.   carvedilol  (COREG ) 12.5 MG tablet Take 1 tablet (12.5 mg total) by mouth 2 (two) times daily with a meal.    Cholecalciferol  (D3-1000) 25 MCG (1000 UT) capsule Take 1,000 Units by mouth daily.   docusate sodium  (COLACE) 100 MG capsule Take 1 capsule (100 mg total) by mouth daily as needed.   estradiol  (ESTRACE  VAGINAL) 0.1 MG/GM vaginal cream Place 1 Applicatorful vaginally 3 (three) times a week. Use 1 small dolyp of cream on tip of index finger and swap the inside of the vagina (Patient taking differently: Place 1 Applicatorful vaginally as needed. Use 1 small dolyp of cream on tip of index finger and swap the inside of the vagina)   fexofenadine (ALLEGRA) 180 MG tablet Take 180 mg by mouth at bedtime.    furosemide  (LASIX ) 20 MG tablet Take 1 tablet (20 mg total) by mouth daily as needed (only when home).   meloxicam  (MOBIC ) 15 MG tablet TAKE 1 TABLET (15 MG TOTAL) BY MOUTH DAILY.   methimazole  (TAPAZOLE ) 5 MG tablet Take 0.5 tablets (2.5 mg total) by mouth daily.   rosuvastatin  (CRESTOR ) 10 MG tablet TAKE 1 TABLET BY MOUTH EVERY DAY   SUMAtriptan  (IMITREX ) 100 MG tablet TAKE 1 TABLET BY MOUTH ONCE. MAY REPEAT IN 2 HOURS IF HEADACHE PERSISTS OR RECURS.   triamcinolone  (NASACORT ) 55 MCG/ACT AERO nasal inhaler Place 1 spray into the nose daily as needed (allergies).   zinc gluconate 50 MG tablet Take 50 mg by mouth daily.   zonisamide  (ZONEGRAN ) 100 MG capsule TAKE 1 CAPSULE BY MOUTH EVERY DAY   No facility-administered encounter medications on file as of 12/24/2023.     Lab Results  Component Value Date   WBC 7.6 03/05/2023   HGB 13.3 03/05/2023   HCT 41.4 03/05/2023   PLT 208.0 03/05/2023   GLUCOSE 106 (H) 11/01/2023   CHOL 137 11/01/2023   TRIG 151.0 (H) 11/01/2023   HDL 50.50 11/01/2023   LDLCALC 56 11/01/2023   ALT 14 11/01/2023   AST 18 11/01/2023   NA 142 11/01/2023   K 4.7 11/01/2023   CL 109 11/01/2023   CREATININE 0.78 11/01/2023   BUN 18 11/01/2023   CO2 27 11/01/2023   TSH 0.93 08/02/2023   INR 0.98 11/26/2015   HGBA1C 6.0 11/01/2023    DG Bone Density Result Date:  12/20/2023 EXAM: DUAL X-RAY ABSORPTIOMETRY (DXA) FOR BONE MINERAL DENSITY 12/20/2023 10:00 am CLINICAL DATA:  73 year old Female Postmenopausal. Screening for osteoporosis TECHNIQUE: An axial (e.g., hips, spine) and/or appendicular (e.g., radius) exam was performed, as appropriate, using GE Secretary/administrator at [facility]. Images are obtained for bone mineral density measurement and are not obtained for diagnostic purposes. [id number] Exclusions: Lumbar spine due to advanced degenerative changes. COMPARISON:  09/02/2018. FINDINGS: Scan quality: Good. LEFT FEMORAL NECK: BMD (in g/cm2): 0.715 T-score: -2.3 Z-score: -0.6 Rate of change from previous exam: No significant rate of change from previous exam. LEFT TOTAL HIP: BMD (in g/cm2): 0.735 T-score: -2.2 Z-score: -0.7 RIGHT FEMORAL NECK: BMD (in g/cm2): 0.736 T-score: -2.2 Z-score: -0.4 RIGHT TOTAL HIP: BMD (in g/cm2): 0.780 T-score: -1.8 Z-score: -0.3 DUAL-FEMUR TOTAL MEAN: Rate of change from previous exam: -3.4 % LEFT FOREARM (RADIUS 33%): BMD (in g/cm2): 0.762 T-score: -1.3 Z-score: 0.6 Rate of change from previous exam: -9.1 % FRAX 10-YEAR PROBABILITY OF FRACTURE: 10-year fracture risk is performed using the [facility] [place] FRAX calculator based on patient-reported risk factors. Major osteoporotic fracture: 21.1% Hip fracture: 4.9% Other situations known to alter the reliability of the FRAX score should be considered when making treatment decisions, including chronic glucocorticoid use and past treatments. Further guidance on treatment can be found at the [facility]'s website [email or web address] IMPRESSION: Osteopenia based on BMD. Fracture risk is increased. Increased risk is based on low BMD and FRAX calculation. RECOMMENDATIONS: 1. All patients should optimize calcium  and vitamin D  intake. 2. Consider FDA-approved medical therapies in postmenopausal women and men aged 21 years and older, based on the  following: - A hip or vertebral (clinical or morphometric) fracture - T-score less than or equal to -2.5 and secondary causes have been excluded. - Low bone mass (T-score between -1.0 and -2.5) and a 10-year probability of a hip fracture greater than or equal to 3% or a 10-year probability of a major osteoporosis-related fracture greater than or equal to 20% based on the [place] -adapted WHO algorithm. - Clinician judgment and/or patient preferences may indicate treatment for people with 10-year fracture probabilities above or below these levels 3. Patients with diagnosis of osteoporosis or at high risk for fracture should have regular bone mineral density tests. For patients eligible for Medicare, routine testing is allowed once every 2 years. The testing frequency can be increased to one year for patients who have rapidly progressing disease, those who are receiving or discontinuing medical therapy to restore bone mass, or have additional risk factors. Electronically Signed   By: Sundra Engel M.D.   On: 12/20/2023 10:28       Assessment & Plan:  Osteopenia, unspecified location Assessment & Plan: Bone density revealed osteopenia with T score -2.2 right femoral neck. Major osteoporotic fracture 21.1% and hip fracture 4.9%. discussed given bone density results and fracture risk, discussed treatment options including oral bisphosphonates and IV reclast. Discussed continuing weight beating exercises and continuing calcium  and vitamin D . After discussion, she desires reclast. Will refer to endocrinology for further treatment. Will need vitamin D  checked.   Orders: -     Ambulatory referral to Endocrinology  Chronic systolic heart failure (HCC) Assessment & Plan: Continue coreg . Continue lasix  prn. No evidence of volume overload. No increased sob. Follow.    Hyperthyroidism Assessment & Plan: Had f/u with endocrinology 08/02/23 - TFTs normal. Recommended to continue methimazole . She has been followed in  [place]. Request to move her care locally. She is planning to see endocrinology for treatment for osteopenia and wants to have follow up of her thyroid  and treatment locally. Referral placed for endocrinology (locally).   Orders: -     Ambulatory referral to Endocrinology     Dellar Fenton, MD

## 2023-12-24 ENCOUNTER — Encounter: Payer: Self-pay | Admitting: Internal Medicine

## 2023-12-24 ENCOUNTER — Ambulatory Visit (INDEPENDENT_AMBULATORY_CARE_PROVIDER_SITE_OTHER): Admitting: Internal Medicine

## 2023-12-24 VITALS — BP 118/70 | HR 63 | Temp 98.2°F | Resp 16 | Ht 61.0 in | Wt 163.0 lb

## 2023-12-24 DIAGNOSIS — E0590 Thyrotoxicosis, unspecified without thyrotoxic crisis or storm: Secondary | ICD-10-CM

## 2023-12-24 DIAGNOSIS — M8580 Other specified disorders of bone density and structure, unspecified site: Secondary | ICD-10-CM

## 2023-12-24 DIAGNOSIS — I5022 Chronic systolic (congestive) heart failure: Secondary | ICD-10-CM

## 2023-12-30 ENCOUNTER — Encounter: Payer: Self-pay | Admitting: Internal Medicine

## 2023-12-30 DIAGNOSIS — M8580 Other specified disorders of bone density and structure, unspecified site: Secondary | ICD-10-CM | POA: Insufficient documentation

## 2023-12-30 NOTE — Assessment & Plan Note (Signed)
 Had f/u with endocrinology 08/02/23 - TFTs normal. Recommended to continue methimazole . She has been followed in [place]. Request to move her care locally. She is planning to see endocrinology for treatment for osteopenia and wants to have follow up of her thyroid  and treatment locally. Referral placed for endocrinology (locally).

## 2023-12-30 NOTE — Assessment & Plan Note (Signed)
 Bone density revealed osteopenia with T score -2.2 right femoral neck. Major osteoporotic fracture 21.1% and hip fracture 4.9%. discussed given bone density results and fracture risk, discussed treatment options including oral bisphosphonates and IV reclast. Discussed continuing weight beating exercises and continuing calcium  and vitamin D . After discussion, she desires reclast. Will refer to endocrinology for further treatment. Will need vitamin D  checked.

## 2023-12-30 NOTE — Assessment & Plan Note (Signed)
 Continue coreg . Continue lasix  prn. No evidence of volume overload. No increased sob. Follow.

## 2024-01-03 ENCOUNTER — Encounter: Payer: Self-pay | Admitting: Internal Medicine

## 2024-01-03 NOTE — Telephone Encounter (Signed)
 Can someone check on the referral to endocrinology in [place]?

## 2024-01-25 ENCOUNTER — Telehealth: Payer: Self-pay

## 2024-01-25 NOTE — Telephone Encounter (Signed)
 I called patient to reschedule her 02/13/2024 appointment with Dr. Dellar Fenton, as she will be out of the office that day.  Patient states she has not heard from the Endocrinologist that Dr. Geralyn Knee referred her to, so she will stay with the one she has already been seeing in [place].  Patient states she would like for us  to please give her a call.

## 2024-01-26 ENCOUNTER — Other Ambulatory Visit: Payer: Self-pay | Admitting: Internal Medicine

## 2024-01-28 NOTE — Telephone Encounter (Signed)
 Pt following up on referral to local endocrinology, Dr Lorelei Rogers

## 2024-01-30 NOTE — Telephone Encounter (Signed)
 Noted

## 2024-01-31 NOTE — Telephone Encounter (Signed)
 Noted

## 2024-01-31 NOTE — Telephone Encounter (Signed)
 Patient has decided that she does not want to wait for [facility]. Can we send the referral to Dr Aldona Amel. She is already established with her for something different but needs reclast infusions.

## 2024-02-13 ENCOUNTER — Ambulatory Visit: Admitting: Internal Medicine

## 2024-02-27 DIAGNOSIS — M810 Age-related osteoporosis without current pathological fracture: Secondary | ICD-10-CM | POA: Diagnosis not present

## 2024-02-27 DIAGNOSIS — E0590 Thyrotoxicosis, unspecified without thyrotoxic crisis or storm: Secondary | ICD-10-CM | POA: Diagnosis not present

## 2024-03-05 ENCOUNTER — Other Ambulatory Visit (INDEPENDENT_AMBULATORY_CARE_PROVIDER_SITE_OTHER)

## 2024-03-05 DIAGNOSIS — D72829 Elevated white blood cell count, unspecified: Secondary | ICD-10-CM | POA: Diagnosis not present

## 2024-03-05 DIAGNOSIS — E7800 Pure hypercholesterolemia, unspecified: Secondary | ICD-10-CM

## 2024-03-05 DIAGNOSIS — R739 Hyperglycemia, unspecified: Secondary | ICD-10-CM

## 2024-03-05 LAB — LIPID PANEL
Cholesterol: 132 mg/dL (ref 0–200)
HDL: 50.3 mg/dL (ref >39.00)
LDL Cholesterol: 67 mg/dL (ref 0–99)
NonHDL: 81.76
Total CHOL/HDL Ratio: 3
Triglycerides: 75 mg/dL (ref 0.0–149.0)
VLDL: 15 mg/dL (ref 0.0–40.0)

## 2024-03-05 LAB — CBC WITH DIFFERENTIAL/PLATELET
Basophils Absolute: 0 K/uL (ref 0.0–0.1)
Basophils Relative: 0.5 % (ref 0.0–3.0)
Eosinophils Absolute: 0.3 K/uL (ref 0.0–0.7)
Eosinophils Relative: 4.1 % (ref 0.0–5.0)
HCT: 42.4 % (ref 36.0–46.0)
Hemoglobin: 13.7 g/dL (ref 12.0–15.0)
Lymphocytes Relative: 30.1 % (ref 12.0–46.0)
Lymphs Abs: 2.1 K/uL (ref 0.7–4.0)
MCHC: 32.3 g/dL (ref 30.0–36.0)
MCV: 88.2 fl (ref 78.0–100.0)
Monocytes Absolute: 0.5 K/uL (ref 0.1–1.0)
Monocytes Relative: 6.5 % (ref 3.0–12.0)
Neutro Abs: 4.1 K/uL (ref 1.4–7.7)
Neutrophils Relative %: 58.8 % (ref 43.0–77.0)
Platelets: 182 K/uL (ref 150.0–400.0)
RBC: 4.8 Mil/uL (ref 3.87–5.11)
RDW: 14.4 % (ref 11.5–15.5)
WBC: 7 K/uL (ref 4.0–10.5)

## 2024-03-05 LAB — BASIC METABOLIC PANEL WITH GFR
BUN: 20 mg/dL (ref 6–23)
CO2: 27 meq/L (ref 19–32)
Calcium: 9 mg/dL (ref 8.4–10.5)
Chloride: 109 meq/L (ref 96–112)
Creatinine, Ser: 0.76 mg/dL (ref 0.40–1.20)
GFR: 78.95 mL/min (ref >60.00)
Glucose, Bld: 104 mg/dL — ABNORMAL HIGH (ref 70–99)
Potassium: 4.1 meq/L (ref 3.5–5.1)
Sodium: 142 meq/L (ref 135–145)

## 2024-03-05 LAB — HEPATIC FUNCTION PANEL
ALT: 15 U/L (ref 0–35)
AST: 20 U/L (ref 0–37)
Albumin: 4 g/dL (ref 3.5–5.2)
Alkaline Phosphatase: 100 U/L (ref 39–117)
Bilirubin, Direct: 0.1 mg/dL (ref 0.0–0.3)
Total Bilirubin: 0.5 mg/dL (ref 0.2–1.2)
Total Protein: 6.4 g/dL (ref 6.0–8.3)

## 2024-03-05 LAB — HEMOGLOBIN A1C: Hgb A1c MFr Bld: 6.1 % (ref 4.6–6.5)

## 2024-03-06 ENCOUNTER — Ambulatory Visit: Payer: Self-pay | Admitting: Internal Medicine

## 2024-03-07 ENCOUNTER — Ambulatory Visit (INDEPENDENT_AMBULATORY_CARE_PROVIDER_SITE_OTHER): Admitting: Internal Medicine

## 2024-03-07 VITALS — BP 110/70 | HR 78 | Resp 16 | Ht 61.0 in | Wt 161.6 lb

## 2024-03-07 DIAGNOSIS — I4949 Other premature depolarization: Secondary | ICD-10-CM | POA: Diagnosis not present

## 2024-03-07 DIAGNOSIS — Z860100 Personal history of colon polyps, unspecified: Secondary | ICD-10-CM | POA: Diagnosis not present

## 2024-03-07 DIAGNOSIS — I471 Supraventricular tachycardia, unspecified: Secondary | ICD-10-CM

## 2024-03-07 DIAGNOSIS — M8580 Other specified disorders of bone density and structure, unspecified site: Secondary | ICD-10-CM

## 2024-03-07 DIAGNOSIS — I5022 Chronic systolic (congestive) heart failure: Secondary | ICD-10-CM

## 2024-03-07 DIAGNOSIS — I420 Dilated cardiomyopathy: Secondary | ICD-10-CM | POA: Diagnosis not present

## 2024-03-07 DIAGNOSIS — K219 Gastro-esophageal reflux disease without esophagitis: Secondary | ICD-10-CM

## 2024-03-07 DIAGNOSIS — R739 Hyperglycemia, unspecified: Secondary | ICD-10-CM | POA: Diagnosis not present

## 2024-03-07 DIAGNOSIS — E0590 Thyrotoxicosis, unspecified without thyrotoxic crisis or storm: Secondary | ICD-10-CM

## 2024-03-07 DIAGNOSIS — E7800 Pure hypercholesterolemia, unspecified: Secondary | ICD-10-CM | POA: Diagnosis not present

## 2024-03-07 DIAGNOSIS — Z8669 Personal history of other diseases of the nervous system and sense organs: Secondary | ICD-10-CM

## 2024-03-07 NOTE — Progress Notes (Unsigned)
 Subjective:    [name], female    DOB: 01-31-1951, 73 y.o.   MRN: [id number]  Patient here for No chief complaint on file.   HPI Here for a scheduled follow up -  follow up regarding afib, CHF, CM, hypercholesterolemia and hypertension. S/p recent shoulder surgery. Completed PT. Had f/u with ortho 09/10/23 - doing well. Saw endocrinology 02/27/24 - planning to start reclast. Also recommended methimazole  2.5mg  q day.    Past Medical History:  Diagnosis Date   Allergy    Anemia    Arthritis    Atrial fibrillation (HCC)    Cardiomyopathy, nonischemic (HCC)    a.) felt to be tachycardia mediated NICM; b.) TTE 09/01/2015: EF 35-40%; c.) [facility] 11/26/2015: ED 30-35%, norm cors, mPA 16, PCWP 7, CO 4.7, CI 2.8; d.) TTE 01/31/2016: EF 40-45%; e.) cMRI 04/27/2016: EF 48%; f.) TTE 12/06/2016: EF 55-60%; g.) TTE 03/25/2018: EF 50-55%   Carpal tunnel syndrome, bilateral    Chronic back pain    Congestive heart failure (HCC)    a.) TTE 09/01/2015: EF 35-40%, mod MR; b.) R/LHC 11/26/2015: EF 30-35%; c.) TTE 01/31/2016: EF 40-45%, mild LVH, diffuse HK, triv MR/TR; d.) TTE 12/06/2016: EF 55-60%, mild MAC, G1DD; e.) TTE 03/25/2018: EF 50-55%, triv PR, G2DD.   Diverticulosis    DOE (dyspnea on exertion)    Dyspnea on exertion    Dysrhythmia    Environmental and seasonal allergies    Female cystocele    GERD (gastroesophageal reflux disease)    Hiatal hernia    History of complications due to general anesthesia    a.) PONV; b.) delayed emergence   Hyperlipidemia    Hypertension    Hyperthyroidism    Migraines    Nontraumatic complete tear of left rotator cuff    NSVT (nonsustained ventricular tachycardia) (HCC) 11/16/2015   a.) felt to be related to NICM in the setting of increased stressors, influenza A, hypotension Tx'd  with IVF bolus   Overactive detrusor    Personal history of colonic polyps    PONV (postoperative nausea and vomiting)    Septic shock (HCC) 11/14/2015    a.) admitted 11/14/2015 - 11/18/2015 for urosepsis + influenza + viral gastroenteritis (+ N/V/D). SBP unpon arrival was in the 60s --> improved to 80s after 4L IVFs --> started on pressors and admitted to ICU.   Skin cancer, basal cell    Squamous cell skin cancer    SUI (stress urinary incontinence, female)    SVT (supraventricular tachycardia) (HCC) 08/31/2015   a.) rates as high as 150 bpm; b.) EP feels as if event was long RP tachycardia   Tendinitis of left rotator cuff    Uterovaginal prolapse    Vaginal atrophy    Past Surgical History:  Procedure Laterality Date   ANTERIOR AND POSTERIOR VAGINAL REPAIR W/ SACROSPINOUS LIGAMENT SUSPENSION  10/18/2012   CARDIAC CATHETERIZATION N/A 11/26/2015   Procedure: Right/Left Heart Cath and Coronary Angiography;  Surgeon: Toribio JONELLE Fuel, MD;  Location: [facility] INVASIVE CV LAB;  Service: Cardiovascular;  Laterality: N/A;   CARPAL TUNNEL RELEASE Right    CATARACT EXTRACTION Bilateral    CHOLECYSTECTOMY  2006   COLONOSCOPY WITH PROPOFOL  N/A 03/19/2017   Procedure: COLONOSCOPY WITH PROPOFOL ;  Surgeon: Gaylyn Gladis PENNER, MD;  Location: [facility] ENDOSCOPY;  Service: Endoscopy;  Laterality: N/A;   COLPOPEXY  10/18/2012   CYSTOSCOPY WITH BIOPSY N/A 04/21/2022   Procedure: CYSTOSCOPY WITH BLADDER BIOPSY;  Surgeon: Francisca Redell BROCKS, MD;  Location: [facility] ORS;  Service: Urology;  Laterality: N/A;   CYSTOURETHROSCOPY  10/18/2012   ESOPHAGOGASTRODUODENOSCOPY (EGD) WITH PROPOFOL  N/A 10/03/2016   Procedure: ESOPHAGOGASTRODUODENOSCOPY (EGD) WITH PROPOFOL ;  Surgeon: Gladis RAYMOND Mariner, MD;  Location: [facility] ENDOSCOPY;  Service: Endoscopy;  Laterality: N/A;   HYSTEROSCOPY  2011, 10/2010   OOPHORECTOMY     REVERSE SHOULDER ARTHROPLASTY Right 06/17/2019   Procedure: REVERSE SHOULDER ARTHROPLASTY;  Surgeon: Edie Norleen PARAS, MD;  Location: [facility] ORS;  Service: Orthopedics;  Laterality: Right;   REVERSE SHOULDER ARTHROPLASTY Left 03/13/2023   Procedure: REVERSE SHOULDER  ARTHROPLASTY;  Surgeon: Edie Norleen PARAS, MD;  Location: [facility] ORS;  Service: Orthopedics;  Laterality: Left;   ROBOTIC ASSISTED LAPAROSCOPIC SACROCOLPOPEXY N/A 06/21/2022   Procedure: XI ROBOTIC ASSISTED LAPAROSCOPIC SACROCOLPOPEXY;  Surgeon: Cam Morene ORN, MD;  Location: [facility] ORS;  Service: Urology;  Laterality: N/A;  240 MINUTES   TONSILLECTOMY     TOTAL SHOULDER REPLACEMENT Right    TRIGGER FINGER RELEASE Bilateral    TUBAL LIGATION     VAGINAL HYSTERECTOMY     midurethral sling   Family History  Problem Relation Age of Onset   Hypertension Mother    Stroke Mother    Diabetes Mother    Heart disease Father    Hypercholesterolemia Father    Hypertension Father    Stroke Father    Heart attack Father    Breast cancer Neg Hx    Colon cancer Neg Hx    Thyroid  disease Neg Hx    Social History   Socioeconomic History   Marital status: Widowed    Spouse name: Not on file   Number of children: 2   Years of education: Not on file   Highest education level: Not on file  Occupational History   Not on file  Tobacco Use   Smoking status: Never    Passive exposure: Never   Smokeless tobacco: Never  Vaping Use   Vaping status: Never Used  Substance and Sexual Activity   Alcohol  use: Not Currently   Drug use: Never   Sexual activity: Yes    Birth control/protection: Post-menopausal  Other Topics Concern   Not on file  Social History Narrative   Lives alone   Social Drivers of Health   Financial Resource Strain: Low Risk  (02/26/2024)   Received from [facility]   Overall Financial Resource Strain (CARDIA)    Difficulty of Paying Living Expenses: Not very hard  Food Insecurity: No Food Insecurity (02/27/2024)   Received from [facility]   Hunger Vital Sign    Within the past 12 months, you worried that your food would run out before you got the money to buy more.: Never true    Within the past 12 months, the food you bought just didn't  last and you didn't have money to get more.: Never true  Transportation Needs: No Transportation Needs (02/27/2024)   Received from [facility] - Transportation    In the past 12 months, has lack of transportation kept you from medical appointments or from getting medications?: No    Lack of Transportation (Non-Medical): No  Physical Activity: Sufficiently Active (11/21/2023)   Exercise Vital Sign    Days of Exercise per Week: 5 days    Minutes of Exercise per Session: 60 min  Stress: No Stress Concern Present (11/21/2023)   [employer] of Occupational Health - Occupational Stress Questionnaire    Feeling of Stress :  Not at all  Social Connections: Moderately Isolated (11/21/2023)   Social Connection and Isolation Panel    Frequency of Communication with Friends and Family: More than three times a week    Frequency of Social Gatherings with Friends and Family: More than three times a week    Attends Religious Services: More than 4 times per year    Active Member of [employer] or Organizations: No    Attends Banker Meetings: Never    Marital Status: Widowed     Review of Systems     Objective:     LMP 10/18/2012  Wt Readings from Last 3 Encounters:  12/24/23 163 lb (73.9 kg)  11/21/23 160 lb (72.6 kg)  11/06/23 166 lb (75.3 kg)    Physical Exam  {Perform Simple Foot Exam  Perform Detailed exam:1} {Insert foot Exam (Optional):30965}   Outpatient Encounter Medications as of 03/07/2024  Medication Sig   acetaminophen  (TYLENOL ) 500 MG tablet Take 500-1,000 mg by mouth every 6 (six) hours as needed for moderate pain or headache.   Ascorbic Acid (VITAMIN C) 1000 MG tablet Take 1,000 mg by mouth daily.   Calcium  Carb-Cholecalciferol  (CALCIUM -VITAMIN D3 PO) Take 1 tablet by mouth daily. 600 mg  20 mcg   calcium  carbonate (TUMS - DOSED IN MG ELEMENTAL CALCIUM ) 500 MG chewable tablet Chew 1 tablet by mouth daily as needed for indigestion or  heartburn.   carvedilol  (COREG ) 12.5 MG tablet Take 1 tablet (12.5 mg total) by mouth 2 (two) times daily with a meal.   Cholecalciferol  (D3-1000) 25 MCG (1000 UT) capsule Take 1,000 Units by mouth daily.   docusate sodium  (COLACE) 100 MG capsule Take 1 capsule (100 mg total) by mouth daily as needed.   estradiol  (ESTRACE  VAGINAL) 0.1 MG/GM vaginal cream Place 1 Applicatorful vaginally 3 (three) times a week. Use 1 small dolyp of cream on tip of index finger and swap the inside of the vagina (Patient taking differently: Place 1 Applicatorful vaginally as needed. Use 1 small dolyp of cream on tip of index finger and swap the inside of the vagina)   fexofenadine (ALLEGRA) 180 MG tablet Take 180 mg by mouth at bedtime.    furosemide  (LASIX ) 20 MG tablet Take 1 tablet (20 mg total) by mouth daily as needed (only when home).   meloxicam  (MOBIC ) 15 MG tablet TAKE 1 TABLET (15 MG TOTAL) BY MOUTH DAILY.   methimazole  (TAPAZOLE ) 5 MG tablet Take 0.5 tablets (2.5 mg total) by mouth daily.   rosuvastatin  (CRESTOR ) 10 MG tablet TAKE 1 TABLET BY MOUTH EVERY DAY   SUMAtriptan  (IMITREX ) 100 MG tablet TAKE 1 TABLET BY MOUTH ONCE. MAY REPEAT IN 2 HOURS IF HEADACHE PERSISTS OR RECURS.   triamcinolone  (NASACORT ) 55 MCG/ACT AERO nasal inhaler Place 1 spray into the nose daily as needed (allergies).   zinc gluconate 50 MG tablet Take 50 mg by mouth daily.   zonisamide  (ZONEGRAN ) 100 MG capsule TAKE 1 CAPSULE BY MOUTH EVERY DAY   No facility-administered encounter medications on file as of 03/07/2024.     Lab Results  Component Value Date   WBC 7.0 03/05/2024   HGB 13.7 03/05/2024   HCT 42.4 03/05/2024   PLT 182.0 03/05/2024   GLUCOSE 104 (H) 03/05/2024   CHOL 132 03/05/2024   TRIG 75.0 03/05/2024   HDL 50.30 03/05/2024   LDLCALC 67 03/05/2024   ALT 15 03/05/2024   AST 20 03/05/2024   NA 142 03/05/2024   K 4.1 03/05/2024  CL 109 03/05/2024   CREATININE 0.76 03/05/2024   BUN 20 03/05/2024   CO2 27  03/05/2024   TSH 0.93 08/02/2023   INR 0.98 11/26/2015   HGBA1C 6.1 03/05/2024    MM 3D SCREENING MAMMOGRAM BILATERAL BREAST Result Date: 12/24/2023 CLINICAL DATA:  Screening. EXAM: DIGITAL SCREENING BILATERAL MAMMOGRAM WITH TOMOSYNTHESIS AND CAD TECHNIQUE: Bilateral screening digital craniocaudal and mediolateral oblique mammograms were obtained. Bilateral screening digital breast tomosynthesis was performed. The images were evaluated with computer-aided detection. COMPARISON:  Previous exam(s). ACR Breast Density Category b: There are scattered areas of fibroglandular density. FINDINGS: There are no findings suspicious for malignancy. IMPRESSION: No mammographic evidence of malignancy. A result letter of this screening mammogram will be mailed directly to the patient. RECOMMENDATION: Screening mammogram in one year. (Code:[id number]-B-01Y) BI-RADS CATEGORY  1: Negative. Electronically Signed   By: Dina  Arceo M.D.   On: 12/24/2023 09:02   DG Bone Density Result Date: 12/20/2023 EXAM: DUAL X-RAY ABSORPTIOMETRY (DXA) FOR BONE MINERAL DENSITY 12/20/2023 10:00 am CLINICAL DATA:  73 year old Female Postmenopausal. Screening for osteoporosis TECHNIQUE: An axial (e.g., hips, spine) and/or appendicular (e.g., radius) exam was performed, as appropriate, using GE Secretary/administrator at [facility]. Images are obtained for bone mineral density measurement and are not obtained for diagnostic purposes. [id number] Exclusions: Lumbar spine due to advanced degenerative changes. COMPARISON:  09/02/2018. FINDINGS: Scan quality: Good. LEFT FEMORAL NECK: BMD (in g/cm2): 0.715 T-score: -2.3 Z-score: -0.6 Rate of change from previous exam: No significant rate of change from previous exam. LEFT TOTAL HIP: BMD (in g/cm2): 0.735 T-score: -2.2 Z-score: -0.7 RIGHT FEMORAL NECK: BMD (in g/cm2): 0.736 T-score: -2.2 Z-score: -0.4 RIGHT TOTAL HIP: BMD (in g/cm2): 0.780 T-score: -1.8 Z-score: -0.3 DUAL-FEMUR TOTAL MEAN:  Rate of change from previous exam: -3.4 % LEFT FOREARM (RADIUS 33%): BMD (in g/cm2): 0.762 T-score: -1.3 Z-score: 0.6 Rate of change from previous exam: -9.1 % FRAX 10-YEAR PROBABILITY OF FRACTURE: 10-year fracture risk is performed using the [facility] FRAX calculator based on patient-reported risk factors. Major osteoporotic fracture: 21.1% Hip fracture: 4.9% Other situations known to alter the reliability of the FRAX score should be considered when making treatment decisions, including chronic glucocorticoid use and past treatments. Further guidance on treatment can be found at the [facility]'s website [email or web address]://www.patton.com/. IMPRESSION: Osteopenia based on BMD. Fracture risk is increased. Increased risk is based on low BMD and FRAX calculation. RECOMMENDATIONS: 1. All patients should optimize calcium  and vitamin D  intake. 2. Consider FDA-approved medical therapies in postmenopausal women and men aged 48 years and older, based on the following: - A hip or vertebral (clinical or morphometric) fracture - T-score less than or equal to -2.5 and secondary causes have been excluded. - Low bone mass (T-score between -1.0 and -2.5) and a 10-year probability of a hip fracture greater than or equal to 3% or a 10-year probability of a major osteoporosis-related fracture greater than or equal to 20% based on the [place] -adapted WHO algorithm. - Clinician judgment and/or patient preferences may indicate treatment for people with 10-year fracture probabilities above or below these levels 3. Patients with diagnosis of osteoporosis or at high risk for fracture should have regular bone mineral density tests. For patients eligible for Medicare, routine testing is allowed once every 2 years. The testing frequency can be increased to one year for patients who have rapidly progressing disease, those who are receiving or discontinuing medical therapy to restore bone mass, or have additional risk factors.  Electronically Signed   By: Reyes Phi M.D.   On: 12/20/2023 10:28       Assessment & Plan:  There are no diagnoses linked to this encounter.   Allena Hamilton, MD

## 2024-03-07 NOTE — Progress Notes (Unsigned)
 Annual Wellness Visit     Patient: Kristi Greer, Female    DOB: 1951/07/23, 73 y.o.   MRN: [id number]  Subjective  Chief Complaint  Patient presents with  . Medical Management of Chronic Issues    Juanell Saffo Hurlbutt is a 73 y.o. female who presents today for her Annual Wellness Visit. She reports consuming a {diet types:17450} diet. {Exercise:19826} She generally feels {well/fairly well/poorly:18703}. She reports sleeping {well/fairly well/poorly:18703}. She {does/does not:200015} have additional problems to discuss today.   HPI  {VISON DENTAL STD PSA (Optional):27386}   {History (Optional):23778}  Medications: Outpatient Medications Prior to Visit  Medication Sig  . acetaminophen  (TYLENOL ) 500 MG tablet Take 500-1,000 mg by mouth every 6 (six) hours as needed for moderate pain or headache.  . Ascorbic Acid (VITAMIN C) 1000 MG tablet Take 1,000 mg by mouth daily.  . Calcium  Carb-Cholecalciferol  (CALCIUM -VITAMIN D3 PO) Take 1 tablet by mouth daily. 600 mg  20 mcg  . calcium  carbonate (TUMS - DOSED IN MG ELEMENTAL CALCIUM ) 500 MG chewable tablet Chew 1 tablet by mouth daily as needed for indigestion or heartburn.  . carvedilol  (COREG ) 12.5 MG tablet Take 1 tablet (12.5 mg total) by mouth 2 (two) times daily with a meal.  . Cholecalciferol  (D3-1000) 25 MCG (1000 UT) capsule Take 1,000 Units by mouth daily.  . docusate sodium  (COLACE) 100 MG capsule Take 1 capsule (100 mg total) by mouth daily as needed.  . estradiol  (ESTRACE  VAGINAL) 0.1 MG/GM vaginal cream Place 1 Applicatorful vaginally 3 (three) times a week. Use 1 small dolyp of cream on tip of index finger and swap the inside of the vagina (Patient taking differently: Place 1 Applicatorful vaginally as needed. Use 1 small dolyp of cream on tip of index finger and swap the inside of the vagina)  . fexofenadine (ALLEGRA) 180 MG tablet Take 180 mg by mouth at bedtime.   . furosemide  (LASIX ) 20 MG tablet Take 1 tablet (20 mg  total) by mouth daily as needed (only when home).  . meloxicam  (MOBIC ) 15 MG tablet TAKE 1 TABLET (15 MG TOTAL) BY MOUTH DAILY.  . methimazole  (TAPAZOLE ) 5 MG tablet Take 0.5 tablets (2.5 mg total) by mouth daily.  . rosuvastatin  (CRESTOR ) 10 MG tablet TAKE 1 TABLET BY MOUTH EVERY DAY  . SUMAtriptan  (IMITREX ) 100 MG tablet TAKE 1 TABLET BY MOUTH ONCE. MAY REPEAT IN 2 HOURS IF HEADACHE PERSISTS OR RECURS.  . triamcinolone  (NASACORT ) 55 MCG/ACT AERO nasal inhaler Place 1 spray into the nose daily as needed (allergies).  . zinc gluconate 50 MG tablet Take 50 mg by mouth daily.  . zonisamide  (ZONEGRAN ) 100 MG capsule TAKE 1 CAPSULE BY MOUTH EVERY DAY   No facility-administered medications prior to visit.    No Known Allergies  Patient Care Team: Glendia Shad, MD as PCP - General (Internal Medicine) Perla, Evalene PARAS, MD as PCP - Cardiology (Cardiology) Donette Ellouise LABOR, FNP as Nurse Practitioner (Family Medicine) Ammon Blunt, MD as Consulting Physician (Cardiology) Eleanora, Stefano Banter, MD as Consulting Physician (Endocrinology)  ROS      Objective  BP 110/70   Pulse 78   Resp 16   Ht 5' 1 (1.549 m)   Wt 161 lb 10.1 oz (73.3 kg)   LMP 10/18/2012   SpO2 99%   BMI 30.54 kg/m  {Vitals History (Optional):23777}  Physical Exam    Most recent functional status assessment:    11/21/2023    2:28 PM  In your present state  of health, do you have any difficulty performing the following activities:  Hearing? 1  Comment wears aids  Vision? 0  Comment readers  Difficulty concentrating or making decisions? 0  Walking or climbing stairs? 0  Dressing or bathing? 0  Doing errands, shopping? 0  Preparing Food and eating ? N  Using the Toilet? N  In the past six months, have you accidently leaked urine? Y  Do you have problems with loss of bowel control? N  Managing your Medications? N  Managing your Finances? N  Housekeeping or managing your Housekeeping? N   Most  recent fall risk assessment:    11/21/2023    2:31 PM  Fall Risk   Falls in the past year? 0  Number falls in past yr: 0  Injury with Fall? 0  Risk for fall due to : No Fall Risks  Follow up Falls prevention discussed;Falls evaluation completed    Most recent depression screenings:    11/21/2023    2:35 PM 06/14/2023   10:29 AM  PHQ 2/9 Scores  PHQ - 2 Score 0 0  PHQ- 9 Score 0    Most recent cognitive screening:    11/21/2023    2:42 PM  6CIT Screen  What Year? 0 points  What month? 0 points  What time? 0 points  Count back from 20 0 points  Months in reverse 0 points  Repeat phrase 0 points  Total Score 0 points   Most recent Audit-C alcohol  use screening    11/21/2023    2:35 PM  Alcohol  Use Disorder Test (AUDIT)  1. How often do you have a drink containing alcohol ? 0  2. How many drinks containing alcohol  do you have on a typical day when you are drinking? 0  3. How often do you have six or more drinks on one occasion? 0  AUDIT-C Score 0   A score of 3 or more in women, and 4 or more in men indicates increased risk for alcohol  abuse, EXCEPT if all of the points are from question 1   Vision/Hearing Screen: No results found.  {Labs (Optional):23779}  No results found for any visits on 03/07/24.    Assessment & Plan   Annual wellness visit done today including the all of the following: Reviewed patient's Family Medical History Reviewed and updated list of patient's medical providers Assessment of cognitive impairment was done Assessed patient's functional ability Established a written schedule for health screening services Health Risk Assessent Completed and Reviewed  Exercise Activities and Dietary recommendations  Goals     .  DIET - REDUCE PORTION SIZE (pt-stated)      Drink more water      .  Increase physical activity (pt-stated)      Exercise at least 3 times weekly    .  Patient Stated      Wants to work on her diet and exercise and  continue to stay active        Immunization History  Administered Date(s) Administered  . Fluad Quad(high Dose 65+) 04/28/2019, 09/29/2020, 06/07/2021, 06/12/2022  . Fluad Trivalent(High Dose 65+) 06/14/2023  . Influenza Split 05/27/2012, 05/26/2013, 05/15/2014  . Influenza, High Dose Seasonal PF 05/01/2018  . Influenza-Unspecified 05/26/2015  . Pneumococcal Conjugate-13 05/13/2019  . Pneumococcal Polysaccharide-23 01/28/2021  . Zoster Recombinant(Shingrix) 10/11/2021, 12/14/2021    Health Maintenance  Topic Date Due  . DTaP/Tdap/Td (1 - Tdap) Never done  . INFLUENZA VACCINE  03/28/2024  . Medicare Annual Wellness (AWV)  11/20/2024  . MAMMOGRAM  12/19/2024  . Colonoscopy  02/22/2031  . Pneumococcal Vaccine: 50+ Years  Completed  . DEXA SCAN  Completed  . Hepatitis C Screening  Completed  . Zoster Vaccines- Shingrix  Completed  . Hepatitis B Vaccines  Aged Out  . HPV VACCINES  Aged Out  . Meningococcal B Vaccine  Aged Out  . COVID-19 Vaccine  Discontinued     Discussed health benefits of physical activity, and encouraged her to engage in regular exercise appropriate for her age and condition.    Problem List Items Addressed This Visit       Cardiovascular and Mediastinum   Premature beats   Relevant Orders   EKG 12-Lead     Other   Hypercholesterolemia - Primary   On crestor .  Low cholesterol diet and exercise.  Follow lipid panel and liver function tests.  Discussed recent labs.  Lab Results  Component Value Date   CHOL 132 03/05/2024   HDL 50.30 03/05/2024   LDLCALC 67 03/05/2024   TRIG 75.0 03/05/2024   CHOLHDL 3 03/05/2024          No follow-ups on file.     Rox Mcgriff, CMA

## 2024-03-07 NOTE — Assessment & Plan Note (Signed)
 On crestor .  Low cholesterol diet and exercise.  Follow lipid panel and liver function tests.  Discussed recent labs.  Lab Results  Component Value Date   CHOL 132 03/05/2024   HDL 50.30 03/05/2024   LDLCALC 67 03/05/2024   TRIG 75.0 03/05/2024   CHOLHDL 3 03/05/2024

## 2024-03-12 ENCOUNTER — Encounter: Payer: Self-pay | Admitting: Internal Medicine

## 2024-03-12 NOTE — Assessment & Plan Note (Signed)
 Bone density revealed osteopenia with T score -2.2 right femoral neck. Major osteoporotic fracture 21.1% and hip fracture 4.9%. discussed given bone density results and fracture risk, discussed treatment options including oral bisphosphonates and IV reclast. Discussed continuing weight beating exercises and continuing calcium  and vitamin D . After discussion, she desires reclast. Saw endocrinology. Planning reclast infusion 03/19/24.

## 2024-03-12 NOTE — Assessment & Plan Note (Signed)
 Continue coreg . Stable.

## 2024-03-12 NOTE — Assessment & Plan Note (Signed)
Colonoscopy 02/2017.  Recommended f/u in 5 years.  Had f/u colonoscopy 02/21/21.  

## 2024-03-12 NOTE — Assessment & Plan Note (Signed)
 Low carb diet and exercise. Follow met b and A1c.  Lab Results  Component Value Date   HGBA1C 6.1 03/05/2024

## 2024-03-12 NOTE — Assessment & Plan Note (Signed)
Worked up in the headache clinic.  Stable.

## 2024-03-12 NOTE — Assessment & Plan Note (Signed)
On protonix.  No upper symptoms reported.   

## 2024-03-12 NOTE — Assessment & Plan Note (Signed)
 Had f/u with endocrinology 02/27/24 - Recommended to continue methimazole  2.5mg  q day.

## 2024-03-12 NOTE — Assessment & Plan Note (Signed)
 Continue coreg . Continue lasix  prn. No evidence of volume overload. Breathing stable. No increased sob. Follow.

## 2024-03-12 NOTE — Assessment & Plan Note (Signed)
 No evidence of volume overload.  On coreg  and lasix  prn.  Follow metabolic panel.

## 2024-03-17 DIAGNOSIS — M12812 Other specific arthropathies, not elsewhere classified, left shoulder: Secondary | ICD-10-CM | POA: Diagnosis not present

## 2024-03-17 DIAGNOSIS — M75122 Complete rotator cuff tear or rupture of left shoulder, not specified as traumatic: Secondary | ICD-10-CM | POA: Diagnosis not present

## 2024-03-17 DIAGNOSIS — G5603 Carpal tunnel syndrome, bilateral upper limbs: Secondary | ICD-10-CM | POA: Diagnosis not present

## 2024-03-17 DIAGNOSIS — Z96612 Presence of left artificial shoulder joint: Secondary | ICD-10-CM | POA: Diagnosis not present

## 2024-03-21 DIAGNOSIS — M810 Age-related osteoporosis without current pathological fracture: Secondary | ICD-10-CM | POA: Diagnosis not present

## 2024-04-03 ENCOUNTER — Other Ambulatory Visit: Payer: Self-pay | Admitting: Surgery

## 2024-04-07 ENCOUNTER — Telehealth: Payer: Self-pay

## 2024-04-07 NOTE — Telephone Encounter (Signed)
   Name: Kristi Greer  DOB: 03-Jun-1951  MRN: [id number]  Primary Cardiologist: Evalene Lunger, MD Last OV 07/31/23  Preoperative team, please contact this patient and set up a phone call appointment for further preoperative risk assessment. Please obtain consent and complete medication review. Thank you for your help.  I confirm that guidance regarding antiplatelet and oral anticoagulation therapy has been completed and, if necessary, noted below. None requested.   I also confirmed the patient resides in the state of [place] . As per [facility] Medical Board telemedicine laws, the patient must reside in the state in which the provider is licensed.   Elsye Mccollister D Linn Clavin, NP 04/07/2024, 9:40 AM [facility]

## 2024-04-07 NOTE — Telephone Encounter (Signed)
 Patient scheduled for pre-op clearance on 04/11/24 with Damien Braver, NP

## 2024-04-07 NOTE — Telephone Encounter (Signed)
-----   Message from Kristi CHARLENA Greer sent at 04/05/2024 12:13 AM EDT ----- Regarding: Request for pre-operative cardiac clearance Request for pre-operative cardiac clearance:  1. What type of surgery is being performed?  RELEASE, CARPAL TUNNEL, ENDOSCOPIC  2. When is this surgery scheduled?  04/16/2024  3. Type of clearance being requested (medical, pharmacy, both)? MEDICAL   4. Are there any medications that need to be held prior to surgery? None  5. Practice name and name of physician performing surgery?  Performing surgeon: Dr. Norleen Maltos, MD Requesting clearance: Kristi Pereyra, FNP-C    6. Anesthesia type (none, local, MAC, general)? GENERAL  7. What is the office phone and fax number?   Phone: [phone] Fax: [phone]  ATTENTION: Unable to create telephone message as per your standard workflow. Directed by [facility] providers to send requests for cardiac clearance to this pool for appropriate distribution to provider covering pre-operative clearances.   Kristi Pereyra, MSN, APRN, FNP-C, CEN [facility]  Peri-operative Services Nurse Practitioner Phone: [phone] 04/05/24 12:13 AM

## 2024-04-07 NOTE — Telephone Encounter (Signed)
   Pre-operative Risk Assessment    Patient Name: Kristi Greer  DOB: November 23, 1950 MRN: [id number]   Date of last office visit: 07/31/23 EVALENE LUNGER, MD Date of next office visit: NONE   Request for Surgical Clearance    Procedure:  RELEASE, CARPAL TUNNEL, ENDOSCOPIC  Date of Surgery:  Clearance 04/16/24                                Surgeon:  Dr. Norleen Maltos, MD  Surgeon's Group or Practice Name:  [facility]  Phone number:  [phone] Fax number:  [phone]   Type of Clearance Requested:   - Medical    Type of Anesthesia:  General    Additional requests/questions:    SignedLucie DELENA Ku   04/07/2024, 9:06 AM    Elnor Dorise BRAVO, NP  P Cv Div Preop Callback Request for pre-operative cardiac clearance:   1. What type of surgery is being performed? RELEASE, CARPAL TUNNEL, ENDOSCOPIC  2. When is this surgery scheduled? 04/16/2024   3. Type of clearance being requested (medical, pharmacy, both)? MEDICAL   4. Are there any medications that need to be held prior to surgery? None  5. Practice name and name of physician performing surgery? Performing surgeon: Dr. Norleen Maltos, MD Requesting clearance: Dorise Elnor, FNP-C     6. Anesthesia type (none, local, MAC, general)? GENERAL  7. What is the office phone and fax number?   Phone: [phone] Fax: [phone]  ATTENTION: Unable to create telephone message as per your standard workflow. Directed by [facility] providers to send requests for cardiac clearance to this pool for appropriate distribution to provider covering pre-operative clearances.  Dorise Elnor, MSN, APRN, FNP-C, CEN [facility] Peri-operative Services Nurse Practitioner Phone: [phone] 04/05/24 12:13 AM

## 2024-04-07 NOTE — Telephone Encounter (Signed)
  Patient Consent for Virtual Visit        Kristi Greer has provided verbal consent on 04/07/2024 for a virtual visit (video or telephone).   CONSENT FOR VIRTUAL VISIT FOR:  Kristi Greer  By participating in this virtual visit I agree to the following:  I hereby voluntarily request, consent and authorize [facility] and its employed or contracted physicians, physician assistants, nurse practitioners or other licensed health care professionals (the Practitioner), to provide me with telemedicine health care services (the "Services) as deemed necessary by the treating Practitioner. I acknowledge and consent to receive the Services by the Practitioner via telemedicine. I understand that the telemedicine visit will involve communicating with the Practitioner through live audiovisual communication technology and the disclosure of certain medical information by electronic transmission. I acknowledge that I have been given the opportunity to request an in-person assessment or other available alternative prior to the telemedicine visit and am voluntarily participating in the telemedicine visit.  I understand that I have the right to withhold or withdraw my consent to the use of telemedicine in the course of my care at any time, without affecting my right to future care or treatment, and that the Practitioner or I may terminate the telemedicine visit at any time. I understand that I have the right to inspect all information obtained and/or recorded in the course of the telemedicine visit and may receive copies of available information for a reasonable fee.  I understand that some of the potential risks of receiving the Services via telemedicine include:  Delay or interruption in medical evaluation due to technological equipment failure or disruption; Information transmitted may not be sufficient (e.g. poor resolution of images) to allow for appropriate medical decision making by the  Practitioner; and/or  In rare instances, security protocols could fail, causing a breach of personal health information.  Furthermore, I acknowledge that it is my responsibility to provide information about my medical history, conditions and care that is complete and accurate to the best of my ability. I acknowledge that Practitioner's advice, recommendations, and/or decision may be based on factors not within their control, such as incomplete or inaccurate data provided by me or distortions of diagnostic images or specimens that may result from electronic transmissions. I understand that the practice of medicine is not an exact science and that Practitioner makes no warranties or guarantees regarding treatment outcomes. I acknowledge that a copy of this consent can be made available to me via my patient portal [facility] MyChart), or I can request a printed copy by calling the office of [facility].    I understand that my insurance will be billed for this visit.   I have read or had this consent read to me. I understand the contents of this consent, which adequately explains the benefits and risks of the Services being provided via telemedicine.  I have been provided ample opportunity to ask questions regarding this consent and the Services and have had my questions answered to my satisfaction. I give my informed consent for the services to be provided through the use of telemedicine in my medical care

## 2024-04-10 ENCOUNTER — Encounter
Admission: RE | Admit: 2024-04-10 | Discharge: 2024-04-10 | Disposition: A | Discharge status: Home / Self Care | Source: Ambulatory Visit | Attending: Surgery | Admitting: Surgery

## 2024-04-10 ENCOUNTER — Other Ambulatory Visit: Payer: Self-pay

## 2024-04-10 NOTE — Patient Instructions (Addendum)
 Your procedure is scheduled on:   [facility] AUGUST 20  Report to the Registration Desk on the 1st floor of the [facility] To find out your arrival time, please call [phone] between 1PM - 3PM on:   TUESDAY AUGUST 19  If your arrival time is 6:00 am, do not arrive before that time as the Medical Mall entrance doors do not open until 6:00 am.  REMEMBER: Instructions that are not followed completely may result in serious medical risk, up to and including death; or upon the discretion of your surgeon and anesthesiologist your surgery may need to be rescheduled.  Do not eat food after midnight the night before surgery.  No gum chewing or hard candies.  You may however, drink CLEAR liquids up to 2 hours before you are scheduled to arrive for your surgery. Do not drink anything within 2 hours of your scheduled arrival time.  Clear liquids include: - water   - apple juice without pulp - gatorade (not RED colors) - black coffee or tea (Do NOT add milk or creamers to the coffee or tea) Do NOT drink anything that is not on this list.  In addition, your doctor has ordered for you to drink the provided:  Ensure Pre-Surgery Clear Carbohydrate Drink   Drinking this carbohydrate drink up to two hours before surgery helps to reduce insulin resistance and improve patient outcomes. Please complete drinking 2 hours before scheduled arrival time.  One week prior to surgery:  EFFECTIVE IMMEDIATELY  Stop Anti-inflammatories (NSAIDS) such as Advil, Aleve, Ibuprofen, Motrin, Naproxen, Naprosyn and Aspirin  based products such as Excedrin, Goody's Powder, BC Powder. Stop ANY OVER THE COUNTER supplements until after surgery. Ascorbic Acid (VITAMIN C)  Calcium  Carb-Cholecalciferol  (CALCIUM -VITAMIN D3 PO)  calcium  carbonate (TUMS - DOSED IN MG ELEMENTAL CALCIUM )  Cholecalciferol  (D3-1000)  fexofenadine (ALLEGRA)  triamcinolone  (NASACORT )  zinc gluconate   You may however, continue to take Tylenol  if  needed for pain up until the day of surgery. meloxicam  (MOBIC ) hold for 7 days prior to surgery, last dose TUESDAY AUGUST 12  Continue taking all of your other prescription medications up until the day of surgery.  ON THE DAY OF SURGERY ONLY TAKE THESE MEDICATIONS WITH SIPS OF WATER :  carvedilol  (COREG )  methimazole  (TAPAZOLE )  zonisamide  (ZONEGRAN )   No Alcohol  for 24 hours before or after surgery.  No Smoking including e-cigarettes for 24 hours before surgery.  No chewable tobacco products for at least 6 hours before surgery.  No nicotine patches on the day of surgery.  Do not use any recreational drugs for at least a week (preferably 2 weeks) before your surgery.  Please be advised that the combination of cocaine and anesthesia may have negative outcomes, up to and including death. If you test positive for cocaine, your surgery will be cancelled.  On the morning of surgery brush your teeth with toothpaste and water , you may rinse your mouth with mouthwash if you wish. Do not swallow any toothpaste or mouthwash.  Use CHG Soap as directed on instruction sheet.  Do not wear jewelry, make-up, hairpins, clips or nail polish.  For welded (permanent) jewelry: bracelets, anklets, waist bands, etc.  Please have this removed prior to surgery.  If it is not removed, there is a chance that hospital personnel will need to cut it off on the day of surgery.  Do not wear lotions, powders, or perfumes.   Do not shave body hair from the neck down 48 hours before surgery.  Contact  lenses, hearing aids and dentures may not be worn into surgery.  Do not bring valuables to the hospital. [facility] is not responsible for any missing/lost belongings or valuables.   Notify your doctor if there is any change in your medical condition (cold, fever, infection).  Wear comfortable clothing (specific to your surgery type) to the hospital.  After surgery, you can help prevent lung complications by  doing breathing exercises.  Take deep breaths and cough every 1-2 hours. Your doctor may order a device called an Incentive Spirometer to help you take deep breaths.  If you are being discharged the day of surgery, you will not be allowed to drive home. You will need a responsible individual to drive you home and stay with you for 24 hours after surgery.   If you are taking public transportation, you will need to have a responsible individual with you.  Please call the Pre-admissions Testing Dept. at [phone] if you have any questions about these instructions.  Surgery Visitation Policy:  Patients having surgery or a procedure may have two visitors.  Children under the age of 55 must have an adult with them who is not the patient.  Merchandiser, retail to address health-related social needs:  [email or web address]        Preparing for Surgery with CHLORHEXIDINE  GLUCONATE (CHG) Soap  Chlorhexidine  Gluconate (CHG) Soap  o An antiseptic cleaner that kills germs and bonds with the skin to continue killing germs even after washing  o Used for showering the night before surgery and morning of surgery  Before surgery, you can play an important role by reducing the number of germs on your skin.  CHG (Chlorhexidine  gluconate) soap is an antiseptic cleanser which kills germs and bonds with the skin to continue killing germs even after washing.  Please do not use if you have an allergy to CHG or antibacterial soaps. If your skin becomes reddened/irritated stop using the CHG.  1. Shower the NIGHT BEFORE SURGERY and the MORNING OF SURGERY with CHG soap.  2. If you choose to wash your hair, wash your hair first as usual with your normal shampoo.  3. After shampooing, rinse your hair and body thoroughly to remove the shampoo.  4. Use CHG as you would any other liquid soap. You can apply CHG directly to the skin and wash gently with a scrungie or a clean  washcloth.  5. Apply the CHG soap to your body only from the neck down. Do not use on open wounds or open sores. Avoid contact with your eyes, ears, mouth, and genitals (private parts). Wash face and genitals (private parts) with your normal soap.  6. Wash thoroughly, paying special attention to the area where your surgery will be performed.  7. Thoroughly rinse your body with warm water .  8. Do not shower/wash with your normal soap after using and rinsing off the CHG soap.  9. Pat yourself dry with a clean towel.  10. Wear clean pajamas to bed the night before surgery.  11. Place clean sheets on your bed the night of your first shower and do not sleep with pets.  12. Shower again with the CHG soap on the day of surgery prior to arriving at the hospital.  13. Do not apply any deodorants/lotions/powders.  14. Please wear clean clothes to the hospital.

## 2024-04-11 ENCOUNTER — Ambulatory Visit: Attending: Cardiovascular Disease | Admitting: Nurse Practitioner

## 2024-04-11 DIAGNOSIS — Z01810 Encounter for preprocedural cardiovascular examination: Secondary | ICD-10-CM | POA: Diagnosis not present

## 2024-04-11 NOTE — Progress Notes (Signed)
 Virtual Visit via Telephone Note   Because of Kristi Greer co-morbid illnesses, she is at least at moderate risk for complications without adequate follow up.  This format is felt to be most appropriate for this patient at this time.  Due to technical limitations with video connection (technology), today's appointment will be conducted as an audio only telehealth visit, and [name] verbally agreed to proceed in this manner.   All issues noted in this document were discussed and addressed.  No physical exam could be performed with this format.  Evaluation Performed:  Preoperative cardiovascular risk assessment _____________   Date:  04/11/2024   Patient ID:  Kristi Greer, [place] May 10, 1951, MRN [id number] Patient Location:  Home Provider location:   Office  Primary Care Provider:  Glendia Shad, MD Primary Cardiologist:  Evalene Lunger, MD  Chief Complaint / Patient Profile   73 y.o. y/o female with a h/o NICM with recovered EF, NSVT, PSVT, hypertension, hyperlipidemia, hyperthyroidism,arthritis, and GERD who is pending carpal tunnel release on 04/16/2024 with Dr. Norleen Maltos of [facility] and presents today for telephonic preoperative cardiovascular risk assessment.  History of Present Illness    Kristi Greer is a 73 y.o. female who presents via audio/video conferencing for a telehealth visit today.  Pt was last seen in cardiology clinic on 07/31/2023 by Dr. Gollan.  At that time Kristi Greer was doing well.  The patient is now pending procedure as outlined above. Since her last visit, she has done well from a cardiac standpoint.   She denies chest pain, palpitations, dyspnea, pnd, orthopnea, n, v, dizziness, syncope, edema, weight gain, or early satiety. All other systems reviewed and are otherwise negative except as noted above.   Past Medical History    Past Medical History:  Diagnosis Date   Allergy    Anemia     Arthritis    Atrial fibrillation (HCC)    Cardiomyopathy, nonischemic (HCC)    a.) felt to be tachycardia mediated NICM; b.) TTE 09/01/2015: EF 35-40%; c.) [facility] 11/26/2015: ED 30-35%, norm cors, mPA 16, PCWP 7, CO 4.7, CI 2.8; d.) TTE 01/31/2016: EF 40-45%; e.) cMRI 04/27/2016: EF 48%; f.) TTE 12/06/2016: EF 55-60%; g.) TTE 03/25/2018: EF 50-55%   Carpal tunnel syndrome, bilateral    Chronic back pain    Congestive heart failure (HCC)    a.) TTE 09/01/2015: EF 35-40%, mod MR; b.) R/LHC 11/26/2015: EF 30-35%; c.) TTE 01/31/2016: EF 40-45%, mild LVH, diffuse HK, triv MR/TR; d.) TTE 12/06/2016: EF 55-60%, mild MAC, G1DD; e.) TTE 03/25/2018: EF 50-55%, triv PR, G2DD.   Cystocele with prolapse 06/21/2022   Diverticulosis    DOE (dyspnea on exertion)    Dyspnea on exertion    Dysrhythmia    Environmental and seasonal allergies    Female cystocele    GERD (gastroesophageal reflux disease)    Hearing loss 10/14/2022   Hiatal hernia    History of complications due to general anesthesia    a.) PONV; b.) delayed emergence   Hyperlipidemia    Hypertension    Hyperthyroidism    Leukocytosis 12/21/2018   Lumbar spondylosis 03/07/2021   Migraines    Nontraumatic complete tear of left rotator cuff    NSVT (nonsustained ventricular tachycardia) (HCC) 11/16/2015   a.) felt to be related to NICM in the setting of increased stressors, influenza A, hypotension Tx'd  with IVF bolus   Overactive detrusor    Personal history of colonic polyps  PONV (postoperative nausea and vomiting)    Septic shock (HCC) 11/14/2015   a.) admitted 11/14/2015 - 11/18/2015 for urosepsis + influenza + viral gastroenteritis (+ N/V/D). SBP unpon arrival was in the 60s --> improved to 80s after 4L IVFs --> started on pressors and admitted to ICU.   Skin cancer, basal cell    Squamous cell skin cancer    SUI (stress urinary incontinence, female)    SVT (supraventricular tachycardia) (HCC) 08/31/2015   a.) rates as high as  150 bpm; b.) EP feels as if event was long RP tachycardia   Tendinitis of left rotator cuff    Uterovaginal prolapse    Vaginal atrophy    Past Surgical History:  Procedure Laterality Date   ANTERIOR AND POSTERIOR VAGINAL REPAIR W/ SACROSPINOUS LIGAMENT SUSPENSION  10/18/2012   CARDIAC CATHETERIZATION N/A 11/26/2015   Procedure: Right/Left Heart Cath and Coronary Angiography;  Surgeon: Toribio JONELLE Fuel, MD;  Location[facility] INVASIVE CV LAB;  Service: Cardiovascular;  Laterality: N/A;   CARPAL TUNNEL RELEASE Right    CATARACT EXTRACTION Bilateral    CHOLECYSTECTOMY  2006   COLONOSCOPY WITH PROPOFOL  N/A 03/19/2017   Procedure: COLONOSCOPY WITH PROPOFOL ;  Surgeon: Gaylyn Gladis PENNER, MD;  Location: [facility] ENDOSCOPY;  Service: Endoscopy;  Laterality: N/A;   COLPOPEXY  10/18/2012   CYSTOSCOPY WITH BIOPSY N/A 04/21/2022   Procedure: CYSTOSCOPY WITH BLADDER BIOPSY;  Surgeon: Francisca Redell BROCKS, MD;  Location: [facility] ORS;  Service: Urology;  Laterality: N/A;   CYSTOURETHROSCOPY  10/18/2012   ESOPHAGOGASTRODUODENOSCOPY (EGD) WITH PROPOFOL  N/A 10/03/2016   Procedure: ESOPHAGOGASTRODUODENOSCOPY (EGD) WITH PROPOFOL ;  Surgeon: Gladis PENNER Gaylyn, MD;  Location: [facility] ENDOSCOPY;  Service: Endoscopy;  Laterality: N/A;   HYSTEROSCOPY  2011, 10/2010   OOPHORECTOMY     REVERSE SHOULDER ARTHROPLASTY Right 06/17/2019   Procedure: REVERSE SHOULDER ARTHROPLASTY;  Surgeon: Edie Norleen PARAS, MD;  Location: [facility] ORS;  Service: Orthopedics;  Laterality: Right;   REVERSE SHOULDER ARTHROPLASTY Left 03/13/2023   Procedure: REVERSE SHOULDER ARTHROPLASTY;  Surgeon: Edie Norleen PARAS, MD;  Location: [facility] ORS;  Service: Orthopedics;  Laterality: Left;   ROBOTIC ASSISTED LAPAROSCOPIC SACROCOLPOPEXY N/A 06/21/2022   Procedure: XI ROBOTIC ASSISTED LAPAROSCOPIC SACROCOLPOPEXY;  Surgeon: Cam Morene ORN, MD;  Location: [facility] ORS;  Service: Urology;  Laterality: N/A;  240 MINUTES   TONSILLECTOMY     TOTAL SHOULDER REPLACEMENT Right     TRIGGER FINGER RELEASE Bilateral    TUBAL LIGATION     VAGINAL HYSTERECTOMY     midurethral sling    Allergies  No Known Allergies  Home Medications    Prior to Admission medications   Medication Sig Start Date End Date Taking? Authorizing Provider  acetaminophen  (TYLENOL ) 500 MG tablet Take 500-1,000 mg by mouth every 6 (six) hours as needed for moderate pain (pain score 4-6).    [provider]  acetaminophen  (TYLENOL ) 650 MG CR tablet Take 650 mg by mouth daily.    [provider]  Ascorbic Acid (VITAMIN C) 1000 MG tablet Take 2,000 mg by mouth daily.    [provider]  Calcium  Carb-Cholecalciferol  (CALCIUM -VITAMIN D3 PO) Take 1 tablet by mouth daily. 600 mg  20 mcg    [provider]  calcium  carbonate (TUMS - DOSED IN MG ELEMENTAL CALCIUM ) 500 MG chewable tablet Chew 1 tablet by mouth daily as needed for indigestion or heartburn.    [provider]  carvedilol  (COREG ) 12.5 MG tablet Take 1 tablet (12.5 mg total) by mouth 2 (two) times daily  with a meal. 07/31/23   Gollan, Evalene PARAS, MD  Cholecalciferol  (D3-1000) 25 MCG (1000 UT) capsule Take 1,000 Units by mouth daily.    [provider]  docusate sodium  (COLACE) 100 MG capsule Take 1 capsule (100 mg total) by mouth daily as needed. 03/13/23   Poggi, Norleen PARAS, MD  estradiol  (ESTRACE  VAGINAL) 0.1 MG/GM vaginal cream Place 1 Applicatorful vaginally 3 (three) times a week. Use 1 small dolyp of cream on tip of index finger and swap the inside of the vagina Patient taking differently: Place 1 Applicatorful vaginally as needed. Use 1 small dolyp of cream on tip of index finger and swap the inside of the vagina 06/21/22   Cam Morene ORN, MD  fexofenadine (ALLEGRA) 180 MG tablet Take 180 mg by mouth at bedtime.     [provider]  furosemide  (LASIX ) 20 MG tablet Take 1 tablet (20 mg total) by mouth daily as needed (only when home). 03/13/23   Poggi, Norleen PARAS, MD  meloxicam  (MOBIC )  15 MG tablet TAKE 1 TABLET (15 MG TOTAL) BY MOUTH DAILY. Patient taking differently: Take 15 mg by mouth daily as needed for pain. 04/09/23   Janit Thresa HERO, DPM  methimazole  (TAPAZOLE ) 5 MG tablet Take 0.5 tablets (2.5 mg total) by mouth daily. 06/15/23   Trixie File, MD  rosuvastatin  (CRESTOR ) 10 MG tablet TAKE 1 TABLET BY MOUTH EVERY DAY 08/27/23   Glendia Shad, MD  SUMAtriptan  (IMITREX ) 100 MG tablet TAKE 1 TABLET BY MOUTH ONCE. MAY REPEAT IN 2 HOURS IF HEADACHE PERSISTS OR RECURS. 08/30/23   Glendia Shad, MD  triamcinolone  (NASACORT ) 55 MCG/ACT AERO nasal inhaler Place 1 spray into the nose daily as needed (allergies).    [provider]  zinc gluconate 50 MG tablet Take 50 mg by mouth daily.    [provider]  zonisamide  (ZONEGRAN ) 100 MG capsule TAKE 1 CAPSULE BY MOUTH EVERY DAY 01/31/24   Glendia Shad, MD    Physical Exam    Vital Signs:  DECEMBER HEDTKE does not have vital signs available for review today.  Given telephonic nature of communication, physical exam is limited. AAOx3. NAD. Normal affect.  Speech and respirations are unlabored.  Accessory Clinical Findings    None  Assessment & Plan    1.  Preoperative Cardiovascular Risk Assessment:  According to the Revised Cardiac Risk Index (RCRI), her Perioperative Risk of Major Cardiac Event is (%): 0.4. Her Functional Capacity in METs is: 7.01 according to the Duke Activity Status Index (DASI). Therefore, based on ACC/AHA guidelines, patient would be at acceptable risk for the planned procedure without further cardiovascular testing.  The patient was advised that if she develops new symptoms prior to surgery to contact our office to arrange for a follow-up visit, and she verbalized understanding.  A copy of this note will be routed to requesting surgeon.  Time:   Today, I have spent 5 minutes with the patient with telehealth technology discussing medical history, symptoms, and management  plan.     Damien JAYSON Braver, NP  04/11/2024, 1:36 PM

## 2024-04-14 ENCOUNTER — Encounter: Payer: Self-pay | Admitting: Urgent Care

## 2024-04-14 NOTE — Progress Notes (Signed)
 Perioperative / Anesthesia Services  Pre-Admission Testing Clinical Review / Pre-Operative Anesthesia Consult  Date: 04/14/24  PATIENT DEMOGRAPHICS: Name: Kristi Greer DOB: 1951/04/18 MRN:   [id number]  Note: Available PAT nursing documentation and vital signs have been reviewed. Clinical nursing staff has updated patient's PMH/PSHx, current medication list, and drug allergies/intolerances to ensure complete and comprehensive history available to assist care teams in MDM as it pertains to the aforementioned surgical procedure and anticipated anesthetic course. Extensive review of available clinical information personally performed. [place] [facility] and [facility] updated with any diagnoses/procedures that  may have been inadvertently omitted during her intake with the pre-admission testing department's nursing staff.  PLANNED SURGICAL PROCEDURE(S):   Case: [id number] Date/Time: 04/16/24 0829   Procedure: RELEASE, CARPAL TUNNEL, ENDOSCOPIC (Left: Wrist)   Anesthesia type: Choice   Diagnosis: Carpal tunnel syndrome, left [G56.02]   Pre-op diagnosis: Carpal tunnel syndrome, left   Location: [facility] OR ROOM 02 / [facility] ORS FOR ANESTHESIA [facility]   Surgeons: Edie Norleen PARAS, MD        CLINICAL DISCUSSION: Kristi Greer is a 73 y.o. female who is submitted for pre-surgical anesthesia review and clearance prior to her undergoing the above procedure. Patient has never been a smoker. Pertinent PMH includes: CAD, atrial fibrillation, NICM, HFrEF, SVT/long RP tachycardia, NSVT,  HTN, HLD, hypothyroidism, DOE, GERD (uses CaCo3 tablets PRN), hiatal hernia, anemia, OA, BILATERAL carpal tunnel syndrome, chronic back pain/lumbar spondylosis.  Patient is followed by cardiology (Gollan, MD). She was last seen in the cardiology clinic on 07/31/2023; notes reviewed. At the time of her clinic visit, reported worsening exertional dyspnea. Patient denied any chest pain, PND, orthopnea, palpitations, significant  peripheral edema, weakness, fatigue, vertiginous symptoms, or presyncope/syncope. Patient with a past medical history significant for cardiovascular diagnoses. Documented physical exam was grossly benign, providing no evidence of acute exacerbation and/or decompensation of the patient's known cardiovascular conditions.  Patient diagnosed with a nonischemic cardiomyopathy and January 2017.  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  TTE at that time revealed a moderately reduced left ventricular systolic function with an EF of 35-40%.  There were no regional wall motion abnormalities.  There was moderate mitral and mild tricuspid valve regurgitation.  There was no evidence of a significant transvalvular gradient to suggest stenosis.  Patient admitted here at [facility] in January 2017 with acute CHF and SVT.  Patient was given Adenocard , however chemical cardioversion was unsuccessful.  Patient ultimately spontaneously converted without further intervention.     Patient underwent diagnostic RIGHT/LEFT heart catheterization on 09/28/2015.  Study revealed normal coronary anatomy with no evidence of significant obstructive coronary artery disease.  Global hypokinesis was noted.  Patient with well compensated hemodynamics; mean PA = 16 mmHg, PCWP 7 mmHg, PVR 2.0 WU, CO = 4.7 L/min, CI 2.8 L/min/m.  Patient with an episode of documented NSVT captured during admission for acute illness on 11/16/2015. She was admitted to [facility] for septic shock due to concurrent urinary tract infection, influenza A, and viral gastroenteritis. NSVT episode was felt to be related to her NICM in the setting of increased physiological stress from septic shock secondary and viral illness. Patient was received 4 liters of IV saline for SBPs in the 60s, which only improved SBP to the low 80s. vasopressors started and patient was admitted to the ICU;  admission was from 11/14/2015 - 11/18/2015 .  Cardiac MRI was performed on 04/27/2016 revealing a normal  left ventricular size and mildly decreased systolic function with an EF of 48%.  There was mild diffuse hypokinesis.  No evidence of LGE to suggest an ischemic, infiltrative, or inflammatory cardiomyopathy.  There was trivial mitral and tricuspid valve regurgitation.  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  Most recent TTE was performed on 03/25/2018 revealing a low normal left ventricular systolic function with an EF of 50-55%.  There were no regional wall motion abnormalities. Diastolic Doppler parameters consistent with pseudonormalization (G2DD).  There was trivial pulmonary valve regurgitation.  Study revealed normal transvalvular gradients suggesting no evidence of valvular stenosis.  Blood pressure well controlled at 100/60 mmHg on currently prescribed beta-blocker (carvedilol ) and diuretic (furosemide ) therapies.  Patient is on rosuvastatin  for her HLD diagnosis and ASCVD prevention. She is not diabetic.  Patient does not have an OSAH diagnosis. Patient reported to be more sedentary as of late.  She had stopped going to the gym.  She reported that she felt deconditioned.  With that said, patient still participating in pleasure activities such as playing golf.  She is able to complete all of her ADL/IADLs independently without cardiovascular limitation. Per the DASI, patient maintains adequate functional capacity and is able to achieve > 4 METS of activity without experiencing any type of angina/anginal equivalent symptoms. No changes were made to her medication regimen.  Patient to follow-up with outpatient cardiology in 1 ye   Kristi Greer is scheduled for an elective RELEASE, CARPAL TUNNEL, ENDOSCOPIC (Left: Wrist) on 04/16/2024 with Dr. Norleen Maltos, MD.  Given patient's past medical history significant for cardiovascular diagnoses, presurgical cardiac  clearance was sought by the PAT team.  Per cardiology, according to the Revised Cardiac Risk Index (RCRI), her Perioperative Risk of Major Cardiac Event is (%): 0.4. Her Functional Capacity in METs is: 7.01 according to the [facility] Activity Status Index (DASI). Therefore, based on ACC/AHA guidelines, patient would be at ACCEPTABLE risk for the planned procedure without further cardiovascular testing.  In review of her medication reconciliation, the patient is not noted to be taking any type of anticoagulation or antiplatelet therapies that would need to be held during her perioperative course.  Patient reports previous perioperative complications with anesthesia in the past. Patient has a PMH (+) for PONV and (+) delayed emergency from anesthesia. Symptoms and history of PONV will be discussed with patient by anesthesia team on the day of her procedure. Interventions will be ordered as deemed necessary based on patient's individual care needs as determined by anesthesiologist. In review of the available records, it is noted that patient underwent a general anesthetic course here at [facility]  (ASA III) in 02/2023 without documented complications.   MOST RECENT VITAL SIGNS:    04/10/2024   12:01 PM 03/07/2024    1:26 PM 12/24/2023    8:31 AM  Vitals with BMI  Height 5' 1 5' 1 5' 1  Weight 155 lbs 161 lbs 10 oz 163 lbs  BMI 29.3 30.56 30.81  Systolic  110 118  Diastolic  70 70  Pulse  78 63   PROVIDERS/SPECIALISTS: NOTE: Primary physician provider listed below. Patient may have been seen by APP or partner within same practice.   PROVIDER ROLE / SPECIALTY LAST OV  Poggi, Norleen PARAS, MD Orthopedics (Surgeon) 03/17/2024  Glendia Shad, MD Primary Care Provider 03/07/2024  Perla Lye, MD Cardiology 07/31/2023; preop APP call 04/12/2024  Damian Setter, MD Endocrinology 02/27/2024   ALLERGIES: No Known Allergies  CURRENT  HOME MEDICATIONS: No current  facility-administered medications for this encounter.    acetaminophen  (TYLENOL ) 500 MG tablet   acetaminophen  (TYLENOL ) 650 MG CR tablet   Ascorbic Acid (VITAMIN C) 1000 MG tablet   Calcium  Carb-Cholecalciferol  (CALCIUM -VITAMIN D3 PO)   calcium  carbonate (TUMS - DOSED IN MG ELEMENTAL CALCIUM ) 500 MG chewable tablet   carvedilol  (COREG ) 12.5 MG tablet   Cholecalciferol  (D3-1000) 25 MCG (1000 UT) capsule   docusate sodium  (COLACE) 100 MG capsule   estradiol  (ESTRACE  VAGINAL) 0.1 MG/GM vaginal cream   fexofenadine (ALLEGRA) 180 MG tablet   furosemide  (LASIX ) 20 MG tablet   meloxicam  (MOBIC ) 15 MG tablet   methimazole  (TAPAZOLE ) 5 MG tablet   rosuvastatin  (CRESTOR ) 10 MG tablet   SUMAtriptan  (IMITREX ) 100 MG tablet   triamcinolone  (NASACORT ) 55 MCG/ACT AERO nasal inhaler   zinc gluconate 50 MG tablet   zonisamide  (ZONEGRAN ) 100 MG capsule   HISTORY: Past Medical History:  Diagnosis Date   Allergy    Anemia    Arthritis    Atrial fibrillation (HCC)    a.) CHA2DS2VASc = 4 (age, sex, HFrEF, HTN) as of 04/15/2023; b.) rate/rhythm maintained on oral carvedilol ; no chronic OAC   CAD (coronary artery disease)    Cardiomyopathy, nonischemic (HCC)    a.) felt to be tachycardia mediated NICM; b.) TTE 09/01/2015: EF 35-40%; c.) R/LHC 11/26/2015: ED 30-35%, norm cors, mPA 16, PCWP 7, CO 4.7, CI 2.8; d.) TTE 01/31/2016: EF 40-45%; e.) cMRI 04/27/2016: EF 48%; f.) TTE 12/06/2016: EF 55-60%; g.) TTE 03/25/2018: EF 50-55%   Carpal tunnel syndrome, bilateral    Chronic back pain    Cystocele with prolapse 06/21/2022   Diverticulosis    DOE (dyspnea on exertion)    Environmental and seasonal allergies    Female cystocele    GERD (gastroesophageal reflux disease)    Hearing loss 10/14/2022   HFrEF (heart failure with reduced ejection fraction) (HCC)    a.) TTE 09/01/2015: EF 35-40%, mod MR; b.) R/LHC 11/26/2015: EF 30-35%; c.) TTE 01/31/2016: EF 40-45%, mild LVH, diffuse HK, triv MR/TR; d.) TTE  12/06/2016: EF 55-60%, mild MAC, G1DD; e.) TTE 03/25/2018: EF 50-55%, triv PR, G2DD.   Hiatal hernia    History of complications due to general anesthesia    a.) PONV; b.) delayed emergence   Hyperlipidemia    Hypertension    Hyperthyroidism    Lumbar spondylosis 03/07/2021   Migraines    Nontraumatic complete tear of left rotator cuff    NSVT (nonsustained ventricular tachycardia) (HCC) 11/16/2015   a.) felt to be related to NICM in the setting of increased stressors, influenza A, hypotension Tx'd  with IVF bolus   Overactive detrusor    Personal history of colonic polyps    PONV (postoperative nausea and vomiting)    Septic shock (HCC) 11/14/2015   a.) admitted 11/14/2015 - 11/18/2015 for urosepsis + influenza + viral gastroenteritis (+ N/V/D). SBP unpon arrival was in the 60s --> improved to 80s after 4L IVFs --> started on pressors and admitted to ICU.   Skin cancer, basal cell    Squamous cell skin cancer    SUI (stress urinary incontinence, female)    SVT (supraventricular tachycardia) (HCC) 08/31/2015   a.) rates as high as 150 bpm; b.) EP feels as if event was long RP tachycardia   Tendinitis of left rotator cuff    Uterovaginal prolapse    Vaginal atrophy    Past Surgical History:  Procedure Laterality Date   ANTERIOR  AND POSTERIOR VAGINAL REPAIR W/ SACROSPINOUS LIGAMENT SUSPENSION  10/18/2012   CARDIAC CATHETERIZATION N/A 11/26/2015   Procedure: Right/Left Heart Cath and Coronary Angiography;  Surgeon: Toribio JONELLE Fuel, MD;  Location: [facility] INVASIVE CV LAB;  Service: Cardiovascular;  Laterality: N/A;   CARPAL TUNNEL RELEASE Right    CATARACT EXTRACTION Bilateral    CHOLECYSTECTOMY  2006   COLONOSCOPY WITH PROPOFOL  N/A 03/19/2017   Procedure: COLONOSCOPY WITH PROPOFOL ;  Surgeon: Gaylyn Gladis PENNER, MD;  Location: [facility] ENDOSCOPY;  Service: Endoscopy;  Laterality: N/A;   COLPOPEXY  10/18/2012   CYSTOSCOPY WITH BIOPSY N/A 04/21/2022   Procedure: CYSTOSCOPY WITH BLADDER  BIOPSY;  Surgeon: Francisca Redell BROCKS, MD;  Location: [facility] ORS;  Service: Urology;  Laterality: N/A;   CYSTOURETHROSCOPY  10/18/2012   ESOPHAGOGASTRODUODENOSCOPY (EGD) WITH PROPOFOL  N/A 10/03/2016   Procedure: ESOPHAGOGASTRODUODENOSCOPY (EGD) WITH PROPOFOL ;  Surgeon: Gladis PENNER Gaylyn, MD;  Location: [facility] ENDOSCOPY;  Service: Endoscopy;  Laterality: N/A;   HYSTEROSCOPY  2011, 10/2010   OOPHORECTOMY     REVERSE SHOULDER ARTHROPLASTY Right 06/17/2019   Procedure: REVERSE SHOULDER ARTHROPLASTY;  Surgeon: Edie Norleen PARAS, MD;  Location: [facility] ORS;  Service: Orthopedics;  Laterality: Right;   REVERSE SHOULDER ARTHROPLASTY Left 03/13/2023   Procedure: REVERSE SHOULDER ARTHROPLASTY;  Surgeon: Edie Norleen PARAS, MD;  Location: [facility] ORS;  Service: Orthopedics;  Laterality: Left;   ROBOTIC ASSISTED LAPAROSCOPIC SACROCOLPOPEXY N/A 06/21/2022   Procedure: XI ROBOTIC ASSISTED LAPAROSCOPIC SACROCOLPOPEXY;  Surgeon: Cam Morene ORN, MD;  Location: [facility] ORS;  Service: Urology;  Laterality: N/A;  240 MINUTES   TONSILLECTOMY     TOTAL SHOULDER REPLACEMENT Right    TRIGGER FINGER RELEASE Bilateral    TUBAL LIGATION     VAGINAL HYSTERECTOMY     midurethral sling   Family History  Problem Relation Age of Onset   Hypertension Mother    Stroke Mother    Diabetes Mother    Heart disease Father    Hypercholesterolemia Father    Hypertension Father    Stroke Father    Heart attack Father    Breast cancer Neg Hx    Colon cancer Neg Hx    Thyroid  disease Neg Hx    Social History   Tobacco Use   Smoking status: Never    Passive exposure: Never   Smokeless tobacco: Never  Substance Use Topics   Alcohol  use: Not Currently   LABS:  No visits with results within 30 Day(s) from this visit.  Latest known visit with results is:  Lab on 03/05/2024  Component Date Value Ref Range Status   WBC 03/05/2024 7.0  4.0 - 10.5 K/uL Final   RBC 03/05/2024 4.80  3.87 - 5.11 Mil/uL Final   Hemoglobin 03/05/2024 13.7  12.0 -  15.0 g/dL Final   HCT 92/90/7974 42.4  36.0 - 46.0 % Final   MCV 03/05/2024 88.2  78.0 - 100.0 fl Final   MCHC 03/05/2024 32.3  30.0 - 36.0 g/dL Final   RDW 92/90/7974 14.4  11.5 - 15.5 % Final   Platelets 03/05/2024 182.0  150.0 - 400.0 K/uL Final   Neutrophils Relative % 03/05/2024 58.8  43.0 - 77.0 % Final   Lymphocytes Relative 03/05/2024 30.1  12.0 - 46.0 % Final   Monocytes Relative 03/05/2024 6.5  3.0 - 12.0 % Final   Eosinophils Relative 03/05/2024 4.1  0.0 - 5.0 % Final   Basophils Relative 03/05/2024 0.5  0.0 - 3.0 % Final   Neutro Abs 03/05/2024 4.1  1.4 - 7.7  K/uL Final   Lymphs Abs 03/05/2024 2.1  0.7 - 4.0 K/uL Final   Monocytes Absolute 03/05/2024 0.5  0.1 - 1.0 K/uL Final   Eosinophils Absolute 03/05/2024 0.3  0.0 - 0.7 K/uL Final   Basophils Absolute 03/05/2024 0.0  0.0 - 0.1 K/uL Final   Hgb A1c MFr Bld 03/05/2024 6.1  4.6 - 6.5 % Final   Glycemic Control Guidelines for People with Diabetes:Non Diabetic:  <6%Goal of Therapy: <7%Additional Action Suggested:  >8%    Cholesterol 03/05/2024 132  0 - 200 mg/dL Final   ATP III Classification       Desirable:  < 200 mg/dL               Borderline High:  200 - 239 mg/dL          High:  > = 759 mg/dL   Triglycerides 92/90/7974 75.0  0.0 - 149.0 mg/dL Final   Normal:  <849 mg/dLBorderline High:  150 - 199 mg/dL   HDL 92/90/7974 49.69  >39.00 mg/dL Final   VLDL 92/90/7974 15.0  0.0 - 40.0 mg/dL Final   LDL Cholesterol 03/05/2024 67  0 - 99 mg/dL Final   Total CHOL/HDL Ratio 03/05/2024 3   Final                  Men          Women1/2 Average Risk     3.4          3.3Average Risk          5.0          4.42X Average Risk          9.6          7.13X Average Risk          15.0          11.0                       NonHDL 03/05/2024 81.76   Final   NOTE:  Non-HDL goal should be 30 mg/dL higher than patient's LDL goal (i.e. LDL goal of < 70 mg/dL, would have non-HDL goal of < 100 mg/dL)   Total Bilirubin 92/90/7974 0.5  0.2 - 1.2 mg/dL  Final   Bilirubin, Direct 03/05/2024 0.1  0.0 - 0.3 mg/dL Final   Alkaline Phosphatase 03/05/2024 100  39 - 117 U/L Final   AST 03/05/2024 20  0 - 37 U/L Final   ALT 03/05/2024 15  0 - 35 U/L Final   Total Protein 03/05/2024 6.4  6.0 - 8.3 g/dL Final   Albumin 92/90/7974 4.0  3.5 - 5.2 g/dL Final   Sodium 92/90/7974 142  135 - 145 mEq/L Final   Potassium 03/05/2024 4.1  3.5 - 5.1 mEq/L Final   Chloride 03/05/2024 109  96 - 112 mEq/L Final   CO2 03/05/2024 27  19 - 32 mEq/L Final   Glucose, Bld 03/05/2024 104 (H)  70 - 99 mg/dL Final   BUN 92/90/7974 20  6 - 23 mg/dL Final   Creatinine, Ser 03/05/2024 0.76  0.40 - 1.20 mg/dL Final   GFR 92/90/7974 78.95  >60.00 mL/min Final   Calculated using the CKD-EPI Creatinine Equation (2021)   Calcium  03/05/2024 9.0  8.4 - 10.5 mg/dL Final    ECG: Date: 92/88/7974  Time ECG obtained: 1403 PM Rate: 67 bpm Rhythm: Sinus rhythm with PACs Axis (leads I and aVF): normal Intervals: PR 64  ms. QRS 74 ms. QTc 456 ms. ST segment and T wave changes: Nonspecific T wave abnormality. Evidence of a possible, age undetermined, prior infarct:  No Comparison: Similar to previous tracing obtained on 06/14/2023   IMAGING / PROCEDURES: CT HEMATURIA WORKUP performed on 02/10/2022 Cystocele small layering bladder calculi likely responsible for the patient's hematuria. No renal or ureteral calculi. No worrisome renal or bladder lesions. Bilateral parapelvic renal cysts. Status post cholecystectomy. No biliary dilatation. Status post hysterectomy. Both ovaries are still present and appear normal. Fatty replacement of the left gluteus medius and minimus muscles likely related to prior trauma or denervation process.   TRANSTHORACIC ECHOCARDIOGRAM performed on 03/25/2018 Low normal left ventricular systolic function with an EF of 50-55% No regional wall motion abnormalities Diastolic Doppler parameters consistent with pseudonormalization (G2DD). Trivial PR No  AR, MR, TR Normal gradients; no valvular stenosis No pericardial effusion   MRI CARD MORPHOLOGY WO/W CM performed on 04/27/2016 Normal left ventricular size and thickness and mildly decreased systolic function (LVEF = 48%) with mild diffuse hypokinesis. There is no late gadolinium enhancement. Normal right ventricular size, thickness and systolic function (RVEF = 62%) with no regional wall motion abnormalities. Trivial mitral and tricuspid regurgitation. There is no evidence for ischemic, infiltrative or inflammatory cardiomyopathy. Collectively, this is consistent with non-ischemic cardiomyopathy possibly tachycardia induced.   RIGHT/LEFT HEART CATHETERIZATION AND CORONARY ANGIOGRAPHY performed on 11/26/2015 Moderate left ventricular systolic dysfunction with an EF of 30-35%; suspect tachycardia induced Global hypokinesis Normal coronary anatomy with no evidence of obstructive CAD Well compensated hemodynamics Ao =  104/61 (78) LV = 107/6/14 RA = 4 RV = 28/4/8 PA = 25/8 (16) PCW = 7 Fick cardiac output/index = 4.7/2.8 PVR = 2.0 WU SVR = 1265 FA sat = 99% PA sat = 71%, 74%    IMPRESSION AND PLAN: Kristi Greer has been referred for pre-anesthesia review and clearance prior to her undergoing the planned anesthetic and procedural courses. Available labs, pertinent testing, and imaging results were personally reviewed by me in preparation for upcoming operative/procedural course. [facility] medical record has been updated following extensive record review and patient interview with PAT staff.   This patient has been appropriately cleared by cardiology with an overall ACCEPTABLE risk of patient experiencing significant perioperative cardiovascular complications. Based on clinical review performed today (04/14/24), barring any significant acute changes in the patient's overall condition, it is anticipated that she will be able to proceed with the planned surgical intervention. Any  acute changes in clinical condition may necessitate her procedure being postponed and/or cancelled. Patient will meet with anesthesia team (MD and/or CRNA) on the day of her procedure for preoperative evaluation/assessment. Questions regarding anesthetic course will be fielded at that time.   Pre-surgical instructions were reviewed with the patient during his PAT appointment, and questions were fielded to satisfaction by PAT clinical staff. She has been instructed on which medications that she will need to hold prior to surgery, as well as the ones that have been deemed safe/appropriate to take on the day of her procedure. As part of the general education provided by PAT, patient made aware both verbally and in writing, that she would need to abstain from the use of any illegal substances during her perioperative course. She was advised that failure to follow the provided instructions could necessitate case cancellation or result in serious perioperative complications up to and including death. Patient encouraged to contact PAT and/or her surgeon's office to discuss any questions or concerns that may  arise prior to surgery; verbalized understanding.   Dorise Pereyra, MSN, APRN, FNP-C, CEN [employer]  Perioperative Services Nurse Practitioner Phone: [phone] Fax: [phone] 04/14/24 10:45 AM  NOTE: This note has been prepared using Dragon dictation software. Despite my best ability to proofread, there is always the potential that unintentional transcriptional errors may still occur from this process.

## 2024-04-15 NOTE — H&P (Signed)
 History of Present Illness:  Kristi Greer is a 72 y.o. female who presents for evaluation and treatment of a 6+ month history of progressively worsening pain and paresthesias to her left hand. She notes that her symptoms are worse at night, frequently awakening her from sleep. Her symptoms are also aggravated by repetitive activities or when driving her vehicle. She denies any specific injury to the wrist or hand. She is status post a prior mini-open right carpal tunnel release from which she has done quite well. She notes that the symptoms are very similar to what she was experiencing prior to her right carpal tunnel release surgery.  Current Outpatient Medications  Medication Sig Dispense Refill  acetaminophen  (TYLENOL ) 650 MG ER tablet Take 650 mg by mouth 2 (two) times daily  ascorbic acid, vitamin C, (VITAMIN C) 1000 MG tablet Take 1,000 mg by mouth once daily  calcium  carbonate (TUMS) 200 mg calcium  (500 mg) chewable tablet Take by mouth Chew 1 tablet by mouth daily as needed for indigestion or heartburn  CALCIUM  CARBONATE/VITAMIN D3 (CALCIUM  600 + D,3, ORAL) Take 600 mg by mouth nightly.  carvedilol  (COREG ) 12.5 MG tablet TAKE 1 TABLET (12.5 MG TOTAL) BY MOUTH 2 (TWO) TIMES DAILY WITH A MEAL. 3  cholecalciferol  (VITAMIN D3) 1000 unit tablet Take 185 mg by mouth daily.  estradioL  (ESTRACE ) 0.01 % (0.1 mg/gram) vaginal cream Place vaginally continuously as needed  fexofenadine (ALLEGRA) 180 MG tablet Take 180 mg by mouth once daily.  FUROsemide  (LASIX ) 20 MG tablet Take 20 mg by mouth every other day.  meloxicam  (MOBIC ) 15 MG tablet Take 1 tablet by mouth once daily  meloxicam  (MOBIC ) 7.5 MG tablet Take 1 tablet by mouth 2 (two) times daily with meals  methIMAzole  (TAPAZOLE ) 5 MG tablet Take 2.5 mg by mouth once daily  rosuvastatin  (CRESTOR ) 10 MG tablet Take 1 tablet by mouth once daily  SUMAtriptan  (IMITREX ) 100 MG tablet TAKE 1/2 TO 1 TABLET BY MOUTH AT HEADACHE ONSET 9 tablet 0   triamcinolone  (NASACORT  AQ) 55 mcg nasal spray by Nasal route Place 1 spray into the nose daily as needed (allergies). (Patient not taking: Reported on 03/17/2024)  zonisamide  (ZONEGRAN ) 100 MG capsule TAKE 1 CAPSULE (100 MG TOTAL) BY MOUTH ONCE DAILY. PATIENT NEEDS APPOINTMENT 90 capsule 0  zonisamide  (ZONEGRAN ) 100 MG capsule Take 1 capsule by mouth once daily   Allergies:  No Known Allergies  Past Medical History:  Cardiomyopathy, nonischemic (CMS/HHS-HCC)  Chronic gastritis 10/03/2016  Colon polyp  Constipation due to slow transit  Diverticulosis 03/19/2017  History of anesthesia reaction  hard to awaken from colonscopy  Hyperlipidemia  Incomplete bladder emptying  Migraines  PONV (postoperative nausea and vomiting)   Past Surgical History:  CHOLECYSTECTOMY 2006  HYSTERECTOMY VAGINAL N/A 10/18/2012  Procedure: HYSTERECTOMY VAGINAL; Surgeon: Royetta Robin, MD; Location: [facility]; Service: Gynecology; Laterality: N/A;  COLPOPEXY FOR SUSPENSION UTEROSACRUM INTRAPERITONEAL N/A 10/18/2012  Procedure: COLPOPEXY FOR SUSPENSION UTEROSACRUM INTRAPERITONEAL; Surgeon: Royetta Robin, MD; Location: [facility]; Service: Gynecology; Laterality: N/A;  COLPORRHAPHY FOR REPAIR CYSTOCELE ANTERIOR N/A 10/18/2012  Procedure: COLPORRHAPHY FOR REPAIR CYSTOCELE ANTERIOR; Surgeon: Royetta Robin, MD; Location: [facility]; Service: Gynecology; Laterality: N/A;  CYSTOURETHROSCOPY N/A 10/18/2012  Procedure: CYSTOURETHROSCOPY; Surgeon: Royetta Robin, MD; Location: [facility]; Service: Gynecology; Laterality: N/A;  trigger finger release Right 03/24/2016 (Dr. Kathlynn)  EGD 10/03/2016 (Chronic gastritis/Negative for H. Pylori and Barrett's/No Repeat/MUS)  Right Carpal Tunnel Release 01/26/17 Right 01/26/2017 (Dr. Kathlynn)  COLONOSCOPY 03/19/2017 (Negative colon biopsies/Diverticulosis/PHx  CP/Repeat 46yrs/MUS)  Reverse right total shoulder arthroplasty Right 06/17/2019 (Dr.Danish Ruffins)  COLONOSCOPY 02/21/2021  (Sessile serrated adenoma/Repeat 34yrs/TKT)  Release right long trigger finger 08/31/2021 (Dr. Kathlynn)  Robotic sacrocolpopexy 06/21/2022 (Dr.Herric) Reverse left total shoulder arthroplasty with biceps tenodesis Left 03/13/2023 (Dr.Anyjah Roundtree)  Basal Cell Carsonoma Removal  CATARACT EXTRACTION  HYSTEROSCOPY 2011,2012  OOPHORECTOMY  TONSILLECTOMY  TOTAL SHOULDER REPLACEMENT Right   Family History:  Diabetes Mother  High blood pressure (Hypertension) Mother  Stroke Mother  Anesthesia problems Father  PONV  Myocardial Infarction (Heart attack) Father  High blood pressure (Hypertension) Father  Stroke Brother  Leukemia Maternal Grandmother  Malignant hypertension Neg Hx   Social History:   Socioeconomic History:  Marital status: Widowed  Tobacco Use  Smoking status: Never  Smokeless tobacco: Never  Vaping Use  Vaping status: Never Used  Substance and Sexual Activity  Alcohol  use: Yes  Comment: rare celebrations  Drug use: No  Sexual activity: Not Currently  Partners: Male   Social Drivers of Health   Financial Resource Strain: Low Risk (02/26/2024)  Overall Financial Resource Strain (CARDIA)  Difficulty of Paying Living Expenses: Not very hard  Food Insecurity: No Food Insecurity (02/27/2024)  Hunger Vital Sign  Worried About Running Out of Food in the Last Year: Never true  Ran Out of Food in the Last Year: Never true  Transportation Needs: No Transportation Needs (02/27/2024)  PRAPARE - Risk analyst (Medical): No  Lack of Transportation (Non-Medical): No   Review of Systems:  A comprehensive 14 point ROS was performed, reviewed, and the pertinent orthopaedic findings are documented in the HPI.  Physical Exam: Vitals:  03/17/24 1044  BP: 110/60  Weight: 73.9 kg (163 lb)  Height: 154.9 cm (5' 1)  PainSc: 0-No pain  PainLoc: Shoulder   General/Constitutional: The patient appears to be well-nourished, well-developed, and in no acute  distress. Neuro/Psych: Normal mood and affect, oriented to person, place and time. Eyes: Non-icteric. Pupils are equal, round, and reactive to light, and exhibit synchronous movement. ENT: Unremarkable. Lymphatic: No palpable adenopathy. Respiratory: Lungs clear to auscultation, Normal chest excursion, No wheezes, and Non-labored breathing Cardiovascular: Regular rate and rhythm. No murmurs. and No edema, swelling or tenderness, except as noted in detailed exam. Integumentary: No impressive skin lesions present, except as noted in detailed exam. Musculoskeletal: Unremarkable, except as noted in detailed exam.  Left wrist/hand exam: Skin inspection of the left wrist and hand is unremarkable. No swelling, erythema, ecchymosis, abrasions, or other skin abnormalities are identified. She has no tenderness to palpation over the dorsal or volar aspects of the wrist, nor does she have any tenderness to palpation over the dorsal or palmar aspects of her hand. She exhibits full active and passive range of motion of the wrist without any pain or catching. She is able to actively flex and extend all digits fully without any pain or triggering. She is neurovascularly intact to the left hand. She exhibits a positive Phalen's test, but a negative Tinel's test over the carpal tunnel.  Assessment: Left carpal tunnel syndrome.  Plan: The treatment options were discussed with the patient. In addition, patient educational materials were provided regarding the diagnosis and treatment options. Regarding her left wrist/hand pain and paresthesias, the patient is quite frustrated by her worsening symptoms and function limitations, and is not ready to consider more aggressive treatment options. Therefore, I have recommended a surgical procedure, specifically an endoscopic left carpal tunnel release. The procedure was discussed with the patient, as were the  potential risks (including bleeding, infection, nerve and/or blood  vessel injury, persistent or recurrent pain/paresthesias, weakness of grip, stiffness of her fingers, need for further surgery, blood clots, strokes, heart attacks and/or arhythmias, pneumonia, etc.) and benefits. The patient states her understanding and wishes to proceed. All of the patient's questions and concerns were answered. She can call any time with further concerns. She will follow up post-surgery, routine.    H&P reviewed and patient re-examined. No changes.

## 2024-04-16 ENCOUNTER — Ambulatory Visit: Payer: Self-pay | Admitting: Urgent Care

## 2024-04-16 ENCOUNTER — Other Ambulatory Visit: Payer: Self-pay

## 2024-04-16 ENCOUNTER — Encounter: Payer: Self-pay | Admitting: Surgery

## 2024-04-16 ENCOUNTER — Encounter
Admission: RE | Disposition: A | Discharge status: Home / Self Care | Payer: Self-pay | Source: Home / Self Care | Attending: Surgery

## 2024-04-16 ENCOUNTER — Ambulatory Visit
Admission: RE | Admit: 2024-04-16 | Discharge: 2024-04-16 | Disposition: A | Discharge status: Home / Self Care | Attending: Surgery | Admitting: Surgery

## 2024-04-16 DIAGNOSIS — E785 Hyperlipidemia, unspecified: Secondary | ICD-10-CM | POA: Diagnosis not present

## 2024-04-16 DIAGNOSIS — I110 Hypertensive heart disease with heart failure: Secondary | ICD-10-CM | POA: Insufficient documentation

## 2024-04-16 DIAGNOSIS — I428 Other cardiomyopathies: Secondary | ICD-10-CM | POA: Insufficient documentation

## 2024-04-16 DIAGNOSIS — I4891 Unspecified atrial fibrillation: Secondary | ICD-10-CM | POA: Insufficient documentation

## 2024-04-16 DIAGNOSIS — I5022 Chronic systolic (congestive) heart failure: Secondary | ICD-10-CM | POA: Insufficient documentation

## 2024-04-16 DIAGNOSIS — G43909 Migraine, unspecified, not intractable, without status migrainosus: Secondary | ICD-10-CM | POA: Insufficient documentation

## 2024-04-16 DIAGNOSIS — I2510 Atherosclerotic heart disease of native coronary artery without angina pectoris: Secondary | ICD-10-CM | POA: Diagnosis not present

## 2024-04-16 DIAGNOSIS — Z9889 Other specified postprocedural states: Secondary | ICD-10-CM | POA: Diagnosis not present

## 2024-04-16 DIAGNOSIS — E0590 Thyrotoxicosis, unspecified without thyrotoxic crisis or storm: Secondary | ICD-10-CM | POA: Diagnosis not present

## 2024-04-16 DIAGNOSIS — Z79899 Other long term (current) drug therapy: Secondary | ICD-10-CM | POA: Insufficient documentation

## 2024-04-16 DIAGNOSIS — K219 Gastro-esophageal reflux disease without esophagitis: Secondary | ICD-10-CM | POA: Diagnosis not present

## 2024-04-16 DIAGNOSIS — G5602 Carpal tunnel syndrome, left upper limb: Secondary | ICD-10-CM | POA: Diagnosis not present

## 2024-04-16 HISTORY — PX: CARPAL TUNNEL RELEASE: SHX101

## 2024-04-16 HISTORY — DX: Atherosclerotic heart disease of native coronary artery without angina pectoris: I25.10

## 2024-04-16 HISTORY — DX: Unspecified systolic (congestive) heart failure: I50.20

## 2024-04-16 SURGERY — RELEASE, CARPAL TUNNEL, ENDOSCOPIC
Anesthesia: General | Site: Wrist | Laterality: Left

## 2024-04-16 MED ORDER — CHLORHEXIDINE GLUCONATE 0.12 % MT SOLN
OROMUCOSAL | Status: AC
Start: 2024-04-16 — End: 2024-04-16
  Filled 2024-04-16: qty 15

## 2024-04-16 MED ORDER — PROPOFOL 500 MG/50ML IV EMUL
INTRAVENOUS | Status: DC | PRN
  Administered 2024-04-16: 100 ug/kg/min via INTRAVENOUS

## 2024-04-16 MED ORDER — ONDANSETRON HCL 4 MG/2ML IJ SOLN
INTRAMUSCULAR | Status: DC | PRN
  Administered 2024-04-16: 4 mg via INTRAVENOUS

## 2024-04-16 MED ORDER — FENTANYL CITRATE (PF) 100 MCG/2ML IJ SOLN
INTRAMUSCULAR | Status: AC
  Filled 2024-04-16: qty 2

## 2024-04-16 MED ORDER — PHENYLEPHRINE 80 MCG/ML (10ML) SYRINGE FOR IV PUSH (FOR BLOOD PRESSURE SUPPORT)
PREFILLED_SYRINGE | INTRAVENOUS | Status: DC | PRN
  Administered 2024-04-16: 160 ug via INTRAVENOUS

## 2024-04-16 MED ORDER — CHLORHEXIDINE GLUCONATE 0.12 % MT SOLN
15.0000 mL | Freq: Once | OROMUCOSAL | Status: AC
  Administered 2024-04-16: 15 mL via OROMUCOSAL

## 2024-04-16 MED ORDER — ORAL CARE MOUTH RINSE: 15.0000 mL | Freq: Once | OROMUCOSAL | Status: AC

## 2024-04-16 MED ORDER — ESTRADIOL 0.1 MG/GM VA CREA: 1.0000 | TOPICAL_CREAM | VAGINAL | Status: DC | PRN

## 2024-04-16 MED ORDER — LIDOCAINE HCL (PF) 2 % IJ SOLN
INTRAMUSCULAR | Status: AC
  Filled 2024-04-16: qty 5

## 2024-04-16 MED ORDER — BUPIVACAINE HCL (PF) 0.5 % IJ SOLN
INTRAMUSCULAR | Status: DC | PRN
  Administered 2024-04-16: 10 mL

## 2024-04-16 MED ORDER — BUPIVACAINE HCL (PF) 0.5 % IJ SOLN
INTRAMUSCULAR | Status: AC
  Filled 2024-04-16: qty 30

## 2024-04-16 MED ORDER — 0.9 % SODIUM CHLORIDE (POUR BTL) OPTIME
TOPICAL | Status: DC | PRN
  Administered 2024-04-16: 500 mL

## 2024-04-16 MED ORDER — PROPOFOL 1000 MG/100ML IV EMUL
INTRAVENOUS | Status: AC
  Filled 2024-04-16: qty 100

## 2024-04-16 MED ORDER — EPHEDRINE SULFATE-NACL 50-0.9 MG/10ML-% IV SOSY
PREFILLED_SYRINGE | INTRAVENOUS | Status: DC | PRN
  Administered 2024-04-16: 10 mg via INTRAVENOUS

## 2024-04-16 MED ORDER — PROPOFOL 10 MG/ML IV BOLUS
INTRAVENOUS | Status: AC
  Filled 2024-04-16: qty 20

## 2024-04-16 MED ORDER — ONDANSETRON HCL 4 MG/2ML IJ SOLN
INTRAMUSCULAR | Status: AC
  Filled 2024-04-16: qty 2

## 2024-04-16 MED ORDER — PROPOFOL 10 MG/ML IV BOLUS
INTRAVENOUS | Status: DC | PRN
  Administered 2024-04-16: 30 mg via INTRAVENOUS
  Administered 2024-04-16: 200 mg via INTRAVENOUS

## 2024-04-16 MED ORDER — EPHEDRINE 5 MG/ML INJ
INTRAVENOUS | Status: AC
  Filled 2024-04-16: qty 5

## 2024-04-16 MED ORDER — LACTATED RINGERS IV SOLN: INTRAVENOUS | Status: DC

## 2024-04-16 MED ORDER — LIDOCAINE HCL (CARDIAC) PF 100 MG/5ML IV SOSY
PREFILLED_SYRINGE | INTRAVENOUS | Status: DC | PRN
  Administered 2024-04-16: 100 mg via INTRAVENOUS

## 2024-04-16 MED ORDER — CEFAZOLIN SODIUM-DEXTROSE 2-4 GM/100ML-% IV SOLN
2.0000 g | INTRAVENOUS | Status: AC
  Administered 2024-04-16: 2 g via INTRAVENOUS

## 2024-04-16 MED ORDER — CEFAZOLIN SODIUM-DEXTROSE 2-4 GM/100ML-% IV SOLN
INTRAVENOUS | Status: AC
Start: 2024-04-16 — End: 2024-04-16
  Filled 2024-04-16: qty 100

## 2024-04-16 MED ORDER — DEXAMETHASONE SODIUM PHOSPHATE 10 MG/ML IJ SOLN
INTRAMUSCULAR | Status: AC
  Filled 2024-04-16: qty 1

## 2024-04-16 MED ORDER — DEXAMETHASONE SODIUM PHOSPHATE 10 MG/ML IJ SOLN
INTRAMUSCULAR | Status: DC | PRN
  Administered 2024-04-16: 10 mg via INTRAVENOUS

## 2024-04-16 MED ORDER — FENTANYL CITRATE (PF) 100 MCG/2ML IJ SOLN
INTRAMUSCULAR | Status: DC | PRN
  Administered 2024-04-16 (×2): 25 ug via INTRAVENOUS

## 2024-04-16 MED ORDER — PHENYLEPHRINE 80 MCG/ML (10ML) SYRINGE FOR IV PUSH (FOR BLOOD PRESSURE SUPPORT)
PREFILLED_SYRINGE | INTRAVENOUS | Status: AC
  Filled 2024-04-16: qty 10

## 2024-04-16 SURGICAL SUPPLY — 30 items
BNDG COHESIVE 4X5 TAN STRL LF (GAUZE/BANDAGES/DRESSINGS) ×1 IMPLANT
BNDG ELASTIC 2INX 5YD STR LF (GAUZE/BANDAGES/DRESSINGS) ×1 IMPLANT
BNDG ESMARCH 4X12 STRL LF (GAUZE/BANDAGES/DRESSINGS) ×1 IMPLANT
CHLORAPREP W/TINT 26 (MISCELLANEOUS) ×1 IMPLANT
CORD BIP STRL DISP 12FT (MISCELLANEOUS) ×1 IMPLANT
CUFF TOURN SGL QUICK 18X4 (TOURNIQUET CUFF) ×1 IMPLANT
DRAPE SURG 17X11 SM STRL (DRAPES) ×1 IMPLANT
FORCEPS JEWEL BIP 4-3/4 STR (INSTRUMENTS) ×1 IMPLANT
GAUZE SPONGE 4X4 12PLY STRL (GAUZE/BANDAGES/DRESSINGS) ×1 IMPLANT
GAUZE XEROFORM 1X8 LF (GAUZE/BANDAGES/DRESSINGS) ×1 IMPLANT
GLOVE BIO SURGEON STRL SZ8 (GLOVE) ×1 IMPLANT
GLOVE INDICATOR 8.0 STRL GRN (GLOVE) ×1 IMPLANT
GOWN STRL REUS W/ TWL LRG LVL3 (GOWN DISPOSABLE) ×1 IMPLANT
GOWN STRL REUS W/ TWL XL LVL3 (GOWN DISPOSABLE) ×1 IMPLANT
KIT ESCP INSRT D SLOT CANN KN (MISCELLANEOUS) ×1 IMPLANT
KIT TURNOVER KIT A (KITS) ×1 IMPLANT
MANIFOLD NEPTUNE II (INSTRUMENTS) ×1 IMPLANT
NS IRRIG 500ML POUR BTL (IV SOLUTION) ×1 IMPLANT
PACK EXTREMITY ARMC (MISCELLANEOUS) ×1 IMPLANT
PENCIL SMOKE EVACUATOR (MISCELLANEOUS) ×1 IMPLANT
SPLINT WRIST LG LT TX990309 (SOFTGOODS) IMPLANT
SPLINT WRIST LG RT TX900304 (SOFTGOODS) IMPLANT
SPLINT WRIST M LT TX990308 (SOFTGOODS) IMPLANT
SPLINT WRIST M RT TX990303 (SOFTGOODS) IMPLANT
SPLINT WRIST XL LT TX990310 (SOFTGOODS) IMPLANT
SPLINT WRIST XL RT TX990305 (SOFTGOODS) IMPLANT
STOCKINETTE IMPERVIOUS 9X36 MD (GAUZE/BANDAGES/DRESSINGS) ×1 IMPLANT
SUT PROLENE 4 0 PS 2 18 (SUTURE) ×1 IMPLANT
TRAP FLUID SMOKE EVACUATOR (MISCELLANEOUS) IMPLANT
WATER STERILE IRR 500ML POUR (IV SOLUTION) IMPLANT

## 2024-04-16 NOTE — Anesthesia Postprocedure Evaluation (Signed)
 Anesthesia Post Note  Patient: Kristi Greer  Procedure(s) Performed: RELEASE, CARPAL TUNNEL, ENDOSCOPIC (Left: Wrist)  Patient location during evaluation: PACU Anesthesia Type: General Level of consciousness: awake and alert Pain management: pain level controlled Vital Signs Assessment: post-procedure vital signs reviewed and stable Respiratory status: spontaneous breathing, nonlabored ventilation, respiratory function stable and patient connected to nasal cannula oxygen Cardiovascular status: blood pressure returned to baseline and stable Postop Assessment: no apparent nausea or vomiting Anesthetic complications: no   No notable events documented.   Last Vitals:  Vitals:   04/16/24 1005 04/16/24 1015  BP:  112/76  Pulse: 70 68  Resp: 13 14  Temp:  (!) 36.2 C  SpO2: 94% 93%    Last Pain:  Vitals:   04/16/24 1015  TempSrc:   PainSc: 0-No pain                 [name]

## 2024-04-16 NOTE — Anesthesia Procedure Notes (Addendum)
 Procedure Name: LMA Insertion Date/Time: 04/16/2024 8:50 AM  Performed by: Trudy Rankin LABOR, CRNAPre-anesthesia Checklist: Patient identified, Emergency Drugs available, Suction available, Patient being monitored and Timeout performed Patient Re-evaluated:Patient Re-evaluated prior to induction Oxygen Delivery Method: Circle system utilized Preoxygenation: Pre-oxygenation with 100% oxygen Induction Type: IV induction LMA: LMA with gastric port inserted LMA Size: 3.0 Number of attempts: 1 Tube secured with: Tape Dental Injury: Teeth and Oropharynx as per pre-operative assessment

## 2024-04-16 NOTE — H&P (Signed)
 History of Present Illness:  Kristi Greer is a 73 y.o. female who presents for evaluation and treatment of a 6+ month history of progressively worsening pain and paresthesias to her left hand. She notes that her symptoms are worse at night, frequently awakening her from sleep. Her symptoms are also aggravated by repetitive activities or when driving her vehicle. She denies any specific injury to the wrist or hand. She is status post a prior mini-open right carpal tunnel release from which she has done quite well. She notes that the symptoms are very similar to what she was experiencing prior to her right carpal tunnel release surgery.  Current Outpatient Medications  Medication Sig Dispense Refill  acetaminophen  (TYLENOL ) 650 MG ER tablet Take 650 mg by mouth 2 (two) times daily  ascorbic acid, vitamin C, (VITAMIN C) 1000 MG tablet Take 1,000 mg by mouth once daily  calcium  carbonate (TUMS) 200 mg calcium  (500 mg) chewable tablet Take by mouth Chew 1 tablet by mouth daily as needed for indigestion or heartburn  CALCIUM  CARBONATE/VITAMIN D3 (CALCIUM  600 + D,3, ORAL) Take 600 mg by mouth nightly.  carvedilol  (COREG ) 12.5 MG tablet TAKE 1 TABLET (12.5 MG TOTAL) BY MOUTH 2 (TWO) TIMES DAILY WITH A MEAL. 3  cholecalciferol  (VITAMIN D3) 1000 unit tablet Take 185 mg by mouth daily.  estradioL  (ESTRACE ) 0.01 % (0.1 mg/gram) vaginal cream Place vaginally continuously as needed  fexofenadine (ALLEGRA) 180 MG tablet Take 180 mg by mouth once daily.  FUROsemide  (LASIX ) 20 MG tablet Take 20 mg by mouth every other day.  meloxicam  (MOBIC ) 15 MG tablet Take 1 tablet by mouth once daily  meloxicam  (MOBIC ) 7.5 MG tablet Take 1 tablet by mouth 2 (two) times daily with meals  methIMAzole  (TAPAZOLE ) 5 MG tablet Take 2.5 mg by mouth once daily  rosuvastatin  (CRESTOR ) 10 MG tablet Take 1 tablet by mouth once daily  SUMAtriptan  (IMITREX ) 100 MG tablet TAKE 1/2 TO 1 TABLET BY MOUTH AT HEADACHE ONSET 9 tablet 0   triamcinolone  (NASACORT  AQ) 55 mcg nasal spray by Nasal route Place 1 spray into the nose daily as needed (allergies). (Patient not taking: Reported on 03/17/2024)  zonisamide  (ZONEGRAN ) 100 MG capsule TAKE 1 CAPSULE (100 MG TOTAL) BY MOUTH ONCE DAILY. PATIENT NEEDS APPOINTMENT 90 capsule 0  zonisamide  (ZONEGRAN ) 100 MG capsule Take 1 capsule by mouth once daily   Allergies:  No Known Allergies   Past Medical History:  Cardiomyopathy, nonischemic (CMS/HHS-HCC)  Chronic gastritis 10/03/2016  Colon polyp  Constipation due to slow transit  Diverticulosis 03/19/2017  History of anesthesia reaction  hard to awaken from colonscopy  Hyperlipidemia  Incomplete bladder emptying  Migraines  PONV (postoperative nausea and vomiting)   Past Surgical History:  CHOLECYSTECTOMY 2006  HYSTERECTOMY VAGINAL N/A 10/18/2012  Procedure: HYSTERECTOMY VAGINAL; Surgeon: Royetta Robin, MD; Location: [facility]; Service: Gynecology; Laterality: N/A;  COLPOPEXY FOR SUSPENSION UTEROSACRUM INTRAPERITONEAL N/A 10/18/2012  Procedure: COLPOPEXY FOR SUSPENSION UTEROSACRUM INTRAPERITONEAL; Surgeon: Royetta Robin, MD; Location: [facility]; Service: Gynecology; Laterality: N/A;  COLPORRHAPHY FOR REPAIR CYSTOCELE ANTERIOR N/A 10/18/2012  Procedure: COLPORRHAPHY FOR REPAIR CYSTOCELE ANTERIOR; Surgeon: Royetta Robin, MD; Location: [facility]; Service: Gynecology; Laterality: N/A;  CYSTOURETHROSCOPY N/A 10/18/2012  Procedure: CYSTOURETHROSCOPY; Surgeon: Royetta Robin, MD; Location: [facility]; Service: Gynecology; Laterality: N/A;  trigger finger release Right 03/24/2016 (Dr. Kathlynn)  EGD 10/03/2016 (Chronic gastritis/Negative for H. Pylori and Barrett's/No Repeat/MUS)  Right Carpal Tunnel Release 01/26/17 Right 01/26/2017 (Dr. Kathlynn)  COLONOSCOPY 03/19/2017 (Negative colon  biopsies/Diverticulosis/PHx CP/Repeat 75yrs/MUS)  Reverse right total shoulder arthroplasty Right 06/17/2019 (Dr.Shunda Rabadi)  COLONOSCOPY 02/21/2021  (Sessile serrated adenoma/Repeat 95yrs/TKT)  Release right long trigger finger 08/31/2021 (Dr. Kathlynn)  Robotic sacrocolpopexy 06/21/2022 (Dr.Herric) Reverse left total shoulder arthroplasty with biceps tenodesis Left 03/13/2023 (Dr.Neely Kammerer)  Basal Cell Carsonoma Removal  CATARACT EXTRACTION  HYSTEROSCOPY 2011,2012  OOPHORECTOMY  TONSILLECTOMY  TOTAL SHOULDER REPLACEMENT Right   Family History:  Diabetes Mother  High blood pressure (Hypertension) Mother  Stroke Mother  Anesthesia problems Father  PONV  Myocardial Infarction (Heart attack) Father  High blood pressure (Hypertension) Father  Stroke Brother  Leukemia Maternal Grandmother  Malignant hypertension Neg Hx   Social History:   Socioeconomic History:  Marital status: Widowed  Tobacco Use  Smoking status: Never  Smokeless tobacco: Never  Vaping Use  Vaping status: Never Used  Substance and Sexual Activity  Alcohol  use: Yes  Comment: rare celebrations  Drug use: No  Sexual activity: Not Currently  Partners: Male   Social Drivers of Health   Financial Resource Strain: Low Risk (02/26/2024)  Overall Financial Resource Strain (CARDIA)  Difficulty of Paying Living Expenses: Not very hard  Food Insecurity: No Food Insecurity (02/27/2024)  Hunger Vital Sign  Worried About Running Out of Food in the Last Year: Never true  Ran Out of Food in the Last Year: Never true  Transportation Needs: No Transportation Needs (02/27/2024)  PRAPARE - Risk analyst (Medical): No  Lack of Transportation (Non-Medical): No   Review of Systems:  A comprehensive 14 point ROS was performed, reviewed, and the pertinent orthopaedic findings are documented in the HPI.  Physical Exam: Vitals:  03/17/24 1044  BP: 110/60  Weight: 73.9 kg (163 lb)  Height: 154.9 cm (5' 1)  PainSc: 0-No pain  PainLoc: Shoulder   General/Constitutional: The patient appears to be well-nourished, well-developed, and in no acute  distress. Neuro/Psych: Normal mood and affect, oriented to person, place and time. Eyes: Non-icteric. Pupils are equal, round, and reactive to light, and exhibit synchronous movement. ENT: Unremarkable. Lymphatic: No palpable adenopathy. Respiratory: Lungs clear to auscultation, Normal chest excursion, No wheezes, and Non-labored breathing Cardiovascular: Regular rate and rhythm. No murmurs. and No edema, swelling or tenderness, except as noted in detailed exam. Integumentary: No impressive skin lesions present, except as noted in detailed exam. Musculoskeletal: Unremarkable, except as noted in detailed exam.  Left wrist/hand exam: Skin inspection of the left wrist and hand is unremarkable. No swelling, erythema, ecchymosis, abrasions, or other skin abnormalities are identified. She has no tenderness to palpation over the dorsal or volar aspects of the wrist, nor does she have any tenderness to palpation over the dorsal or palmar aspects of her hand. She exhibits full active and passive range of motion of the wrist without any pain or catching. She is able to actively flex and extend all digits fully without any pain or triggering. She is neurovascularly intact to the left hand. She exhibits a positive Phalen's test, but a negative Tinel's test over the carpal tunnel.  Assessment: Left carpal tunnel syndrome.  Plan: The treatment options were discussed with the patient. In addition, patient educational materials were provided regarding the diagnosis and treatment options. Regarding her left wrist/hand pain and paresthesias, the patient is quite frustrated by her worsening symptoms and function limitations, and is not ready to consider more aggressive treatment options. Therefore, I have recommended a surgical procedure, specifically an endoscopic left carpal tunnel release. The procedure was discussed with the patient, as were  the potential risks (including bleeding, infection, nerve and/or blood  vessel injury, persistent or recurrent pain/paresthesias, weakness of grip, stiffness of her fingers, need for further surgery, blood clots, strokes, heart attacks and/or arhythmias, pneumonia, etc.) and benefits. The patient states her understanding and wishes to proceed. All of the patient's questions and concerns were answered. She can call any time with further concerns. She will follow up post-surgery, routine.      H&P reviewed and patient re-examined. No changes.

## 2024-04-16 NOTE — Transfer of Care (Signed)
 Immediate Anesthesia Transfer of Care Note  Patient: MARYALICE PASLEY  Procedure(s) Performed: RELEASE, CARPAL TUNNEL, ENDOSCOPIC (Left: Wrist)  Patient Location: PACU  Anesthesia Type:General  Level of Consciousness: drowsy, lethargic, and responds to stimulation  Airway & Oxygen Therapy: Patient Spontanous Breathing and Patient connected to nasal cannula oxygen  Post-op Assessment: Report given to RN and Post -op Vital signs reviewed and stable  Post vital signs: Reviewed and stable  Last Vitals:  Vitals Value Taken Time  BP 101/59 0931  Temp 97.5  0931  Pulse 65 0931  Resp 12 0931  SpO2 99% 0931    Last Pain:  Vitals:   04/16/24 0708  TempSrc: Temporal  PainSc: 0-No pain         Complications: No notable events documented.

## 2024-04-16 NOTE — Discharge Instructions (Addendum)
 Orthopedic discharge instructions: Keep dressing dry and intact. Keep hand elevated above heart level. May shower after dressing removed on postop day 4 (Sunday). Cover sutures with Band-Aids after drying off. Apply ice to affected area frequently. Resume meloxicam  15 mg daily OR take ibuprofen 600-800 mg TID with meals for 3-5 days, then as necessary. Take ES Tylenol  for breakthrough pain if needed.  Return for follow-up in 10-14 days or as scheduled.

## 2024-04-16 NOTE — Op Note (Signed)
 04/16/2024  9:35 AM  Patient:   Kristi Greer  Pre-Op Diagnosis:   Left carpal tunnel syndrome.  Post-Op Diagnosis:   Same.  Procedure:   Endoscopic left carpal tunnel release.  Surgeon:   DOROTHA Reyes Maltos, MD  Anesthesia:   General LMA  Findings:   As above.  Complications:   None  EBL:   0 cc  Fluids:   500 cc crystalloid  TT:   16 minutes at 250 mmHg  Drains:   None  Closure:   4-0 Prolene interrupted sutures  Brief Clinical Note:   The patient is a 73 year old female with a history of progressive worsening pain and paresthesias to her left hand. Her symptoms have progressed despite medications, activity modification, etc. Her history and examination are consistent with carpal tunnel syndrome. The patient presents at this time for an endoscopic left carpal tunnel release.   Procedure:   The patient was brought into the operating room and lain in the supine position. After adequate general laryngeal mask anesthesia was obtained, the left hand and upper extremity were prepped with ChloraPrep solution before being draped sterilely. Preoperative antibiotics were administered. A timeout was performed to verify the appropriate surgical site before the limb was exsanguinated with an Esmarch and the tourniquet inflated to 250 mmHg.   An approximately 1.5-2 cm incision was made over the volar wrist flexion crease, centered over the palmaris longus tendon. The incision was carried down through the subcutaneous tissues with care taken to identify and protect any neurovascular structures. The distal forearm fascia was penetrated just proximal to the transverse carpal ligament. The soft tissues were released off the superficial and deep surfaces of the distal forearm fascia and this was released proximally for 3-4 cm under direct visualization.  Attention was directed distally. The Therapist, nutritional was passed beneath the transverse carpal ligament along the ulnar aspect of the carpal  tunnel and used to release any adhesions as well as to remove any adherent synovial tissue before first the smaller then the larger of the two dilators were passed beneath the transverse carpal ligament along the ulnar margin of the carpal tunnel. The slotted cannula was introduced and the endoscope was placed into the slotted cannula and the undersurface of the transverse carpal ligament visualized. The distal margin of the transverse carpal ligament was marked by placing a 25-gauge needle percutaneously at Kaplan's cardinal point so that it entered the distal portion of the slotted cannula. Under endoscopic visualization, the transverse carpal ligament was released from proximal to distal using the end-cutting blade. A second pass was performed to ensure complete release of the ligament. The adequacy of release was verified both endoscopically and by palpation using the freer elevator.  The wound was irrigated thoroughly with sterile saline solution before being closed using 4-0 Prolene interrupted sutures. A total of 10 cc of 0.5% plain Sensorcaine  was injected in and around the incision before a sterile bulky dressing was applied to the wound. The patient was placed into a volar wrist splint before being awakened, extubated, and returned to the recovery room in satisfactory condition after tolerating the procedure well.

## 2024-04-16 NOTE — Anesthesia Preprocedure Evaluation (Addendum)
 Anesthesia Evaluation  Patient identified by MRN, date of birth, ID band Patient awake    Reviewed: Allergy & Precautions, NPO status , Patient's Chart, lab work & pertinent test results  History of Anesthesia Complications (+) PONV and history of anesthetic complications  Airway Mallampati: III  TM Distance: >3 FB Neck ROM: full    Dental no notable dental hx.    Pulmonary neg pulmonary ROS   Pulmonary exam normal        Cardiovascular hypertension, On Medications + CAD and +CHF  Normal cardiovascular exam+ dysrhythmias Atrial Fibrillation and Supra Ventricular Tachycardia   Most recent TTE was performed on 03/25/2018 revealing a low normal left ventricular systolic function with an EF of 50-55%.  There were no regional wall motion abnormalities. Diastolic Doppler parameters consistent with pseudonormalization (G2DD).  There was trivial pulmonary valve regurgitation.  Study revealed normal transvalvular gradients suggesting no evidence of valvular stenosis.   Neuro/Psych  Headaches  Neuromuscular disease  negative psych ROS   GI/Hepatic Neg liver ROS, hiatal hernia,GERD  ,,  Endo/Other   Hyperthyroidism   Renal/GU      Musculoskeletal   Abdominal   Peds  Hematology  (+) Blood dyscrasia, anemia   Anesthesia Other Findings Past Medical History: No date: Allergy No date: Anemia No date: Arthritis No date: Atrial fibrillation [facility])     Comment:  a.) CHA2DS2VASc = 4 (age, sex, HFrEF, HTN) as of               04/15/2023; b.) rate/rhythm maintained on oral               carvedilol ; no chronic OAC No date: CAD (coronary artery disease) No date: Cardiomyopathy, nonischemic (HCC)     Comment:  a.) felt to be tachycardia mediated NICM; b.) TTE               09/01/2015: EF 35-40%; c.) [facility] 11/26/2015: ED 30-35%,               norm cors, mPA 16, PCWP 7, CO 4.7, CI 2.8; d.) TTE               01/31/2016: EF 40-45%; e.) cMRI  04/27/2016: EF 48%; f.)               TTE 12/06/2016: EF 55-60%; g.) TTE 03/25/2018: EF 50-55% No date: Carpal tunnel syndrome, bilateral No date: Chronic back pain 06/21/2022: Cystocele with prolapse No date: Diverticulosis No date: DOE (dyspnea on exertion) No date: Environmental and seasonal allergies No date: Female cystocele No date: GERD (gastroesophageal reflux disease) 10/14/2022: Hearing loss No date: HFrEF (heart failure with reduced ejection fraction) (HCC)     Comment:  a.) TTE 09/01/2015: EF 35-40%, mod MR; b.) R/LHC               11/26/2015: EF 30-35%; c.) TTE 01/31/2016: EF 40-45%,               mild LVH, diffuse HK, triv MR/TR; d.) TTE 12/06/2016: EF               55-60%, mild MAC, G1DD; e.) TTE 03/25/2018: EF 50-55%,               triv PR, G2DD. 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 SBP unpon               arrival was in the 60s --> improved to 80s after 4L IVFs               --> started on pressors and admitted to ICU. No date: Skin cancer, basal cell No date: Squamous cell skin cancer No date: SUI (stress urinary incontinence, female) 08/31/2015: SVT (supraventricular tachycardia) (HCC)     Comment:   a.) rates as high as 150 bpm; b.) EP feels as if event               was long RP tachycardia No date: Tendinitis of left rotator cuff No date: Uterovaginal prolapse No date: Vaginal atrophy  Past Surgical History: 10/18/2012: ANTERIOR AND POSTERIOR VAGINAL REPAIR W/ SACROSPINOUS  LIGAMENT SUSPENSION 11/26/2015: CARDIAC CATHETERIZATION; N/A     Comment:  Procedure: Right/Left Heart Cath and Coronary               Angiography;  Surgeon: Toribio JONELLE Fuel, MD;  Location:              [facility] INVASIVE CV LAB;  Service: Cardiovascular;                Laterality: N/A; No date: CARPAL TUNNEL RELEASE; Right No date: CATARACT EXTRACTION; Bilateral 2006: CHOLECYSTECTOMY 03/19/2017: COLONOSCOPY WITH PROPOFOL ; N/A     Comment:  Procedure: COLONOSCOPY WITH PROPOFOL ;  Surgeon:               Gaylyn Gladis PENNER, MD;  Location: [facility] ENDOSCOPY;                Service: Endoscopy;  Laterality: N/A; 10/18/2012: COLPOPEXY 04/21/2022: CYSTOSCOPY WITH BIOPSY; N/A     Comment:  Procedure: CYSTOSCOPY WITH BLADDER BIOPSY;  Surgeon:               Francisca Redell BROCKS, MD;  Location: [facility] ORS;  Service:               Urology;  Laterality: N/A; 10/18/2012: CYSTOURETHROSCOPY 10/03/2016: ESOPHAGOGASTRODUODENOSCOPY (EGD) WITH PROPOFOL ; N/A     Comment:  Procedure: ESOPHAGOGASTRODUODENOSCOPY (EGD) WITH               PROPOFOL ;  Surgeon: Gladis PENNER Gaylyn, MD;  Location:               [facility] ENDOSCOPY;  Service: Endoscopy;  Laterality: N/A; 2011, 10/2010: HYSTEROSCOPY No date: OOPHORECTOMY 06/17/2019: REVERSE SHOULDER ARTHROPLASTY; Right     Comment:  Procedure: REVERSE SHOULDER ARTHROPLASTY;  Surgeon:               Edie Norleen PARAS, MD;  Location: [facility] ORS;  Service:               Orthopedics;  Laterality: Right; 03/13/2023: REVERSE SHOULDER ARTHROPLASTY; Left     Comment:  Procedure: REVERSE SHOULDER ARTHROPLASTY;  Surgeon:               Edie Norleen PARAS, MD;  Location: [facility] ORS;  Service:               Orthopedics;   Laterality: Left; 06/21/2022: ROBOTIC ASSISTED LAPAROSCOPIC SACROCOLPOPEXY; N/A     Comment:  Procedure: XI ROBOTIC ASSISTED LAPAROSCOPIC               SACROCOLPOPEXY;  Surgeon: Cam Morene ORN, MD;                Location: [facility] ORS;  Service: Urology;  Laterality: N/A;                240 MINUTES No date: TONSILLECTOMY No date: TOTAL SHOULDER REPLACEMENT; Right No date: TRIGGER FINGER RELEASE; Bilateral No date: TUBAL LIGATION No date: VAGINAL HYSTERECTOMY     Comment:  midurethral sling  BMI    Body Mass Index: 29.29 kg/m      Reproductive/Obstetrics negative OB ROS                              Anesthesia Physical Anesthesia Plan  ASA: 3  Anesthesia Plan: General LMA   Post-op Pain Management: Toradol  IV (intra-op)* and Ofirmev  IV (intra-op)*   Induction: Intravenous  PONV Risk Score and Plan: 4 or greater and Dexamethasone , Ondansetron , Treatment may vary due to age or medical condition, Propofol  infusion and TIVA  Airway Management Planned: LMA  Additional Equipment:   Intra-op Plan:   Post-operative Plan: Extubation in OR  Informed Consent: I have reviewed the patients History and Physical, chart, labs and discussed the procedure including the risks, benefits and alternatives for the proposed anesthesia with the patient or authorized representative who has indicated his/her understanding and acceptance.     Dental Advisory Given  Plan Discussed with: Anesthesiologist, CRNA and Surgeon  Anesthesia Plan Comments: (Patient consented for risks of anesthesia including but not limited to:  - adverse reactions to medications - damage to eyes, teeth, lips or other oral mucosa - nerve damage due to positioning  - sore throat or hoarseness - Damage to heart, brain, nerves, lungs, other parts of body or loss of life  Patient voiced understanding and assent.)         Anesthesia Quick Evaluation

## 2024-04-17 ENCOUNTER — Encounter: Payer: Self-pay | Admitting: Surgery

## 2024-04-26 ENCOUNTER — Other Ambulatory Visit: Payer: Self-pay | Admitting: Internal Medicine

## 2024-05-29 DIAGNOSIS — L578 Other skin changes due to chronic exposure to nonionizing radiation: Secondary | ICD-10-CM | POA: Diagnosis not present

## 2024-05-29 DIAGNOSIS — Z859 Personal history of malignant neoplasm, unspecified: Secondary | ICD-10-CM | POA: Diagnosis not present

## 2024-05-29 DIAGNOSIS — L821 Other seborrheic keratosis: Secondary | ICD-10-CM | POA: Diagnosis not present

## 2024-05-29 DIAGNOSIS — Z872 Personal history of diseases of the skin and subcutaneous tissue: Secondary | ICD-10-CM | POA: Diagnosis not present

## 2024-05-29 DIAGNOSIS — L570 Actinic keratosis: Secondary | ICD-10-CM | POA: Diagnosis not present

## 2024-05-29 DIAGNOSIS — D485 Neoplasm of uncertain behavior of skin: Secondary | ICD-10-CM | POA: Diagnosis not present

## 2024-05-29 DIAGNOSIS — Z86018 Personal history of other benign neoplasm: Secondary | ICD-10-CM | POA: Diagnosis not present

## 2024-05-29 DIAGNOSIS — Z85828 Personal history of other malignant neoplasm of skin: Secondary | ICD-10-CM | POA: Diagnosis not present

## 2024-06-30 ENCOUNTER — Telehealth: Payer: Self-pay | Admitting: Internal Medicine

## 2024-06-30 DIAGNOSIS — R739 Hyperglycemia, unspecified: Secondary | ICD-10-CM

## 2024-06-30 DIAGNOSIS — E7800 Pure hypercholesterolemia, unspecified: Secondary | ICD-10-CM

## 2024-06-30 DIAGNOSIS — E0590 Thyrotoxicosis, unspecified without thyrotoxic crisis or storm: Secondary | ICD-10-CM

## 2024-06-30 NOTE — Telephone Encounter (Signed)
 Lab order needed

## 2024-07-01 NOTE — Telephone Encounter (Signed)
 Orders placed for labs

## 2024-07-09 ENCOUNTER — Other Ambulatory Visit (INDEPENDENT_AMBULATORY_CARE_PROVIDER_SITE_OTHER)

## 2024-07-09 DIAGNOSIS — E0590 Thyrotoxicosis, unspecified without thyrotoxic crisis or storm: Secondary | ICD-10-CM

## 2024-07-09 DIAGNOSIS — E7800 Pure hypercholesterolemia, unspecified: Secondary | ICD-10-CM

## 2024-07-09 DIAGNOSIS — R739 Hyperglycemia, unspecified: Secondary | ICD-10-CM

## 2024-07-09 LAB — LIPID PANEL
Cholesterol: 144 mg/dL (ref 0–200)
HDL: 52.8 mg/dL (ref >39.00)
LDL Cholesterol: 71 mg/dL (ref 0–99)
NonHDL: 90.7
Total CHOL/HDL Ratio: 3
Triglycerides: 100 mg/dL (ref 0.0–149.0)
VLDL: 20 mg/dL (ref 0.0–40.0)

## 2024-07-09 LAB — BASIC METABOLIC PANEL WITH GFR
BUN: 16 mg/dL (ref 6–23)
CO2: 27 meq/L (ref 19–32)
Calcium: 8.6 mg/dL (ref 8.4–10.5)
Chloride: 108 meq/L (ref 96–112)
Creatinine, Ser: 0.74 mg/dL (ref 0.40–1.20)
GFR: 81.32 mL/min (ref >60.00)
Glucose, Bld: 95 mg/dL (ref 70–99)
Potassium: 4.2 meq/L (ref 3.5–5.1)
Sodium: 141 meq/L (ref 135–145)

## 2024-07-09 LAB — HEPATIC FUNCTION PANEL
ALT: 14 U/L (ref 0–35)
AST: 17 U/L (ref 0–37)
Albumin: 4 g/dL (ref 3.5–5.2)
Alkaline Phosphatase: 82 U/L (ref 39–117)
Bilirubin, Direct: 0.1 mg/dL (ref 0.0–0.3)
Total Bilirubin: 0.4 mg/dL (ref 0.2–1.2)
Total Protein: 6.1 g/dL (ref 6.0–8.3)

## 2024-07-09 LAB — TSH: TSH: 1.28 u[IU]/mL (ref 0.35–5.50)

## 2024-07-09 LAB — HEMOGLOBIN A1C: Hgb A1c MFr Bld: 6 % (ref 4.6–6.5)

## 2024-07-10 ENCOUNTER — Ambulatory Visit: Payer: Self-pay | Admitting: Internal Medicine

## 2024-07-11 ENCOUNTER — Encounter: Payer: Self-pay | Admitting: Internal Medicine

## 2024-07-11 ENCOUNTER — Ambulatory Visit: Admitting: Internal Medicine

## 2024-07-11 VITALS — BP 126/74 | HR 68 | Temp 98.0°F | Ht 61.0 in | Wt 162.0 lb

## 2024-07-11 DIAGNOSIS — I4729 Other ventricular tachycardia: Secondary | ICD-10-CM

## 2024-07-11 DIAGNOSIS — G43809 Other migraine, not intractable, without status migrainosus: Secondary | ICD-10-CM | POA: Diagnosis not present

## 2024-07-11 DIAGNOSIS — Z96611 Presence of right artificial shoulder joint: Secondary | ICD-10-CM | POA: Diagnosis not present

## 2024-07-11 DIAGNOSIS — E0590 Thyrotoxicosis, unspecified without thyrotoxic crisis or storm: Secondary | ICD-10-CM

## 2024-07-11 DIAGNOSIS — M542 Cervicalgia: Secondary | ICD-10-CM

## 2024-07-11 DIAGNOSIS — Z23 Encounter for immunization: Secondary | ICD-10-CM | POA: Diagnosis not present

## 2024-07-11 DIAGNOSIS — I420 Dilated cardiomyopathy: Secondary | ICD-10-CM

## 2024-07-11 DIAGNOSIS — R739 Hyperglycemia, unspecified: Secondary | ICD-10-CM

## 2024-07-11 DIAGNOSIS — Z860100 Personal history of colon polyps, unspecified: Secondary | ICD-10-CM | POA: Diagnosis not present

## 2024-07-11 DIAGNOSIS — I5022 Chronic systolic (congestive) heart failure: Secondary | ICD-10-CM

## 2024-07-11 DIAGNOSIS — I471 Supraventricular tachycardia, unspecified: Secondary | ICD-10-CM | POA: Diagnosis not present

## 2024-07-11 DIAGNOSIS — E7800 Pure hypercholesterolemia, unspecified: Secondary | ICD-10-CM

## 2024-07-11 MED ORDER — ROSUVASTATIN CALCIUM 10 MG PO TABS: 10.0000 mg | ORAL_TABLET | Freq: Every day | ORAL | 3 refills | Status: AC

## 2024-07-11 NOTE — Progress Notes (Signed)
 Subjective:    [name], female    DOB: 1951-01-06, 73 y.o.   MRN: [id number]  Patient here for  Chief Complaint  Patient presents with   Medical Management of Chronic Issues    HPI Here for a scheduled follow up - follow up regarding afib, CHF, CM, hypercholesterolemia and hypertension. S/p recent shoulder surgery. Completed PT. Saw endocrinology 02/27/24 - reclast  03/19/24. Also recommended methimazole . Is s/p carpal tunnel release 04/2024 - had f/u 06/02/24 - doing well. She does report some neck pain - left side. Started approximately three weeks ago. No radicular symptoms.  Has meloxicam . No chest pain. Breathing stable. No abdominal pain or bowel change. Stays active.    Past Medical History:  Diagnosis Date   Allergy    Anemia    Arthritis    Atrial fibrillation (HCC)    a.) CHA2DS2VASc = 4 (age, sex, HFrEF, HTN) as of 04/15/2023; b.) rate/rhythm maintained on oral carvedilol ; no chronic OAC   CAD (coronary artery disease)    Cardiomyopathy, nonischemic (HCC)    a.) felt to be tachycardia mediated NICM; b.) TTE 09/01/2015: EF 35-40%; c.) R/LHC 11/26/2015: ED 30-35%, norm cors, mPA 16, PCWP 7, CO 4.7, CI 2.8; d.) TTE 01/31/2016: EF 40-45%; e.) cMRI 04/27/2016: EF 48%; f.) TTE 12/06/2016: EF 55-60%; g.) TTE 03/25/2018: EF 50-55%   Carpal tunnel syndrome, bilateral    Chronic back pain    Cystocele with prolapse 06/21/2022   Diverticulosis    DOE (dyspnea on exertion)    Environmental and seasonal allergies    Female cystocele    GERD (gastroesophageal reflux disease)    Hearing loss 10/14/2022   HFrEF (heart failure with reduced ejection fraction) (HCC)    a.) TTE 09/01/2015: EF 35-40%, mod MR; b.) R/LHC 11/26/2015: EF 30-35%; c.) TTE 01/31/2016: EF 40-45%, mild LVH, diffuse HK, triv MR/TR; d.) TTE 12/06/2016: EF 55-60%, mild MAC, G1DD; e.) TTE 03/25/2018: EF 50-55%, triv PR, G2DD.   Hiatal hernia    History of complications due to general anesthesia     a.) PONV; b.) delayed emergence   Hyperlipidemia    Hypertension    Hyperthyroidism    Lumbar spondylosis 03/07/2021   Migraines    Nontraumatic complete tear of left rotator cuff    NSVT (nonsustained ventricular tachycardia) (HCC) 11/16/2015   a.) felt to be related to NICM in the setting of increased stressors, influenza A, hypotension Tx'd  with IVF bolus   Overactive detrusor    Personal history of colonic polyps    PONV (postoperative nausea and vomiting)    Septic shock (HCC) 11/14/2015   a.) admitted 11/14/2015 - 11/18/2015 for urosepsis + influenza + viral gastroenteritis (+ N/V/D). SBP unpon arrival was in the 60s --> improved to 80s after 4L IVFs --> started on pressors and admitted to ICU.   Skin cancer, basal cell    Squamous cell skin cancer    SUI (stress urinary incontinence, female)    SVT (supraventricular tachycardia) 08/31/2015   a.) rates as high as 150 bpm; b.) EP feels as if event was long RP tachycardia   Tendinitis of left rotator cuff    Uterovaginal prolapse    Vaginal atrophy    Past Surgical History:  Procedure Laterality Date   ANTERIOR AND POSTERIOR VAGINAL REPAIR W/ SACROSPINOUS LIGAMENT SUSPENSION  10/18/2012   CARDIAC CATHETERIZATION N/A 11/26/2015   Procedure: Right/Left Heart Cath and Coronary Angiography;  Surgeon: Toribio JONELLE Fuel, MD;  Location: [facility] INVASIVE  CV LAB;  Service: Cardiovascular;  Laterality: N/A;   CARPAL TUNNEL RELEASE Right    CARPAL TUNNEL RELEASE Left 04/16/2024   Procedure: RELEASE, CARPAL TUNNEL, ENDOSCOPIC;  Surgeon: Edie Norleen PARAS, MD;  Location: [facility] ORS;  Service: Orthopedics;  Laterality: Left;   CATARACT EXTRACTION Bilateral    CHOLECYSTECTOMY  2006   COLONOSCOPY WITH PROPOFOL  N/A 03/19/2017   Procedure: COLONOSCOPY WITH PROPOFOL ;  Surgeon: Gaylyn Gladis PENNER, MD;  Location: [facility] ENDOSCOPY;  Service: Endoscopy;  Laterality: N/A;   COLPOPEXY  10/18/2012   CYSTOSCOPY WITH BIOPSY N/A 04/21/2022   Procedure: CYSTOSCOPY  WITH BLADDER BIOPSY;  Surgeon: Francisca Redell BROCKS, MD;  Location: [facility] ORS;  Service: Urology;  Laterality: N/A;   CYSTOURETHROSCOPY  10/18/2012   ESOPHAGOGASTRODUODENOSCOPY (EGD) WITH PROPOFOL  N/A 10/03/2016   Procedure: ESOPHAGOGASTRODUODENOSCOPY (EGD) WITH PROPOFOL ;  Surgeon: Gladis PENNER Gaylyn, MD;  Location: [facility] ENDOSCOPY;  Service: Endoscopy;  Laterality: N/A;   HYSTEROSCOPY  2011, 10/2010   OOPHORECTOMY     REVERSE SHOULDER ARTHROPLASTY Right 06/17/2019   Procedure: REVERSE SHOULDER ARTHROPLASTY;  Surgeon: Edie Norleen PARAS, MD;  Location: [facility] ORS;  Service: Orthopedics;  Laterality: Right;   REVERSE SHOULDER ARTHROPLASTY Left 03/13/2023   Procedure: REVERSE SHOULDER ARTHROPLASTY;  Surgeon: Edie Norleen PARAS, MD;  Location: [facility] ORS;  Service: Orthopedics;  Laterality: Left;   ROBOTIC ASSISTED LAPAROSCOPIC SACROCOLPOPEXY N/A 06/21/2022   Procedure: XI ROBOTIC ASSISTED LAPAROSCOPIC SACROCOLPOPEXY;  Surgeon: Cam Morene ORN, MD;  Location: [facility] ORS;  Service: Urology;  Laterality: N/A;  240 MINUTES   TONSILLECTOMY     TOTAL SHOULDER REPLACEMENT Right    TRIGGER FINGER RELEASE Bilateral    TUBAL LIGATION     VAGINAL HYSTERECTOMY     midurethral sling   Family History  Problem Relation Age of Onset   Hypertension Mother    Stroke Mother    Diabetes Mother    Heart disease Father    Hypercholesterolemia Father    Hypertension Father    Stroke Father    Heart attack Father    Breast cancer Neg Hx    Colon cancer Neg Hx    Thyroid  disease Neg Hx    Social History   Socioeconomic History   Marital status: Widowed    Spouse name: Not on file   Number of children: 2   Years of education: Not on file   Highest education level: Not on file  Occupational History   Not on file  Tobacco Use   Smoking status: Never    Passive exposure: Never   Smokeless tobacco: Never  Vaping Use   Vaping status: Never Used  Substance and Sexual Activity   Alcohol  use: Not Currently   Drug use: Never    Sexual activity: Yes    Birth control/protection: Post-menopausal  Other Topics Concern   Not on file  Social History Narrative   Lives alone   Social Drivers of Health   Financial Resource Strain: Low Risk  (06/02/2024)   Received from [facility]   Overall Financial Resource Strain (CARDIA)    Difficulty of Paying Living Expenses: Not hard at all  Food Insecurity: No Food Insecurity (06/02/2024)   Received from [facility]   Hunger Vital Sign    Within the past 12 months, you worried that your food would run out before you got the money to buy more.: Never true    Within the past 12 months, the food you bought just didn't last and you didn't have  money to get more.: Never true  Transportation Needs: No Transportation Needs (06/02/2024)   Received from [facility] - Transportation    In the past 12 months, has lack of transportation kept you from medical appointments or from getting medications?: No    Lack of Transportation (Non-Medical): No  Physical Activity: Sufficiently Active (11/21/2023)   Exercise Vital Sign    Days of Exercise per Week: 5 days    Minutes of Exercise per Session: 60 min  Stress: No Stress Concern Present (11/21/2023)   [employer] of Occupational Health - Occupational Stress Questionnaire    Feeling of Stress : Not at all  Social Connections: Moderately Isolated (11/21/2023)   Social Connection and Isolation Panel    Frequency of Communication with Friends and Family: More than three times a week    Frequency of Social Gatherings with Friends and Family: More than three times a week    Attends Religious Services: More than 4 times per year    Active Member of [employer] or Organizations: No    Attends Banker Meetings: Never    Marital Status: Widowed     Review of Systems  Constitutional:  Negative for appetite change and unexpected weight change.  HENT:  Negative for  congestion and sinus pressure.   Respiratory:  Negative for cough, chest tightness and shortness of breath.   Cardiovascular:  Negative for chest pain, palpitations and leg swelling.  Gastrointestinal:  Negative for abdominal pain, diarrhea, nausea and vomiting.  Genitourinary:  Negative for difficulty urinating and dysuria.  Musculoskeletal:  Positive for neck pain. Negative for joint swelling and myalgias.  Skin:  Negative for color change and rash.  Neurological:  Negative for dizziness and headaches.  Psychiatric/Behavioral:  Negative for agitation and dysphoric mood.        Objective:     BP 126/74   Pulse 68   Temp 98 F (36.7 C) (Oral)   Ht 5' 1 (1.549 m)   Wt 162 lb (73.5 kg)   LMP 10/18/2012   SpO2 95%   BMI 30.61 kg/m  Wt Readings from Last 3 Encounters:  07/11/24 162 lb (73.5 kg)  04/16/24 155 lb (70.3 kg)  04/10/24 155 lb (70.3 kg)    Physical Exam Vitals reviewed.  Constitutional:      General: She is not in acute distress.    Appearance: Normal appearance.  HENT:     Head: Normocephalic and atraumatic.     Right Ear: External ear normal.     Left Ear: External ear normal.     Mouth/Throat:     Pharynx: No oropharyngeal exudate or posterior oropharyngeal erythema.  Eyes:     General: No scleral icterus.       Right eye: No discharge.        Left eye: No discharge.     Conjunctiva/sclera: Conjunctivae normal.  Neck:     Thyroid : No thyromegaly.  Cardiovascular:     Rate and Rhythm: Normal rate and regular rhythm.  Pulmonary:     Effort: No respiratory distress.     Breath sounds: Normal breath sounds. No wheezing.  Abdominal:     General: Bowel sounds are normal.     Palpations: Abdomen is soft.     Tenderness: There is no abdominal tenderness.  Musculoskeletal:        General: No swelling or tenderness.     Cervical back: Neck supple. No tenderness.  Lymphadenopathy:  Cervical: No cervical adenopathy.  Skin:    Findings: No erythema or  rash.  Neurological:     Mental Status: She is alert.  Psychiatric:        Mood and Affect: Mood normal.        Behavior: Behavior normal.         Outpatient Encounter Medications as of 07/11/2024  Medication Sig   acetaminophen  (TYLENOL ) 500 MG tablet Take 500-1,000 mg by mouth every 6 (six) hours as needed for moderate pain (pain score 4-6).   Ascorbic Acid (VITAMIN C) 1000 MG tablet Take 2,000 mg by mouth daily.   Calcium  Carb-Cholecalciferol  (CALCIUM -VITAMIN D3 PO) Take 1 tablet by mouth daily. 600 mg  20 mcg   calcium  carbonate (TUMS - DOSED IN MG ELEMENTAL CALCIUM ) 500 MG chewable tablet Chew 1 tablet by mouth daily as needed for indigestion or heartburn.   carvedilol  (COREG ) 12.5 MG tablet Take 1 tablet (12.5 mg total) by mouth 2 (two) times daily with a meal.   Cholecalciferol  (D3-1000) 25 MCG (1000 UT) capsule Take 1,000 Units by mouth daily.   docusate sodium  (COLACE) 100 MG capsule Take 1 capsule (100 mg total) by mouth daily as needed.   fexofenadine (ALLEGRA) 180 MG tablet Take 180 mg by mouth at bedtime.    furosemide  (LASIX ) 20 MG tablet Take 1 tablet (20 mg total) by mouth daily as needed (only when home).   meloxicam  (MOBIC ) 15 MG tablet TAKE 1 TABLET (15 MG TOTAL) BY MOUTH DAILY. (Patient taking differently: Take 15 mg by mouth daily as needed for pain.)   methimazole  (TAPAZOLE ) 5 MG tablet Take 0.5 tablets (2.5 mg total) by mouth daily.   SUMAtriptan  (IMITREX ) 100 MG tablet TAKE 1 TABLET BY MOUTH ONCE. MAY REPEAT IN 2 HOURS IF HEADACHE PERSISTS OR RECURS.   triamcinolone  (NASACORT ) 55 MCG/ACT AERO nasal inhaler Place 1 spray into the nose daily as needed (allergies).   zinc gluconate 50 MG tablet Take 50 mg by mouth daily.   zonisamide  (ZONEGRAN ) 100 MG capsule TAKE 1 CAPSULE BY MOUTH EVERY DAY   rosuvastatin  (CRESTOR ) 10 MG tablet Take 1 tablet (10 mg total) by mouth daily.   [DISCONTINUED] estradiol  (ESTRACE  VAGINAL) 0.1 MG/GM vaginal cream Place 1 Applicatorful  vaginally as needed. Use 1 small dolyp of cream on tip of index finger and swap the inside of the vagina (Patient not taking: Reported on 07/11/2024)   [DISCONTINUED] rosuvastatin  (CRESTOR ) 10 MG tablet TAKE 1 TABLET BY MOUTH EVERY DAY   No facility-administered encounter medications on file as of 07/11/2024.     Lab Results  Component Value Date   WBC 7.0 03/05/2024   HGB 13.7 03/05/2024   HCT 42.4 03/05/2024   PLT 182.0 03/05/2024   GLUCOSE 95 07/09/2024   CHOL 144 07/09/2024   TRIG 100.0 07/09/2024   HDL 52.80 07/09/2024   LDLCALC 71 07/09/2024   ALT 14 07/09/2024   AST 17 07/09/2024   NA 141 07/09/2024   K 4.2 07/09/2024   CL 108 07/09/2024   CREATININE 0.74 07/09/2024   BUN 16 07/09/2024   CO2 27 07/09/2024   TSH 1.28 07/09/2024   INR 0.98 11/26/2015   HGBA1C 6.0 07/09/2024       Assessment & Plan:  Need for influenza vaccination -     Flu vaccine HIGH DOSE PF(Fluzone Trivalent)  Hyperthyroidism Assessment & Plan: Had f/u with endocrinology 02/27/24 - Recommended to continue methimazole  2.5mg  q day. Continue f/u with endocrinology.   Orders: -  TSH; Future  Hypercholesterolemia Assessment & Plan: On crestor .  Low cholesterol diet and exercise.  Follow lipid panel.  Lab Results  Component Value Date   CHOL 144 07/09/2024   HDL 52.80 07/09/2024   LDLCALC 71 07/09/2024   TRIG 100.0 07/09/2024   CHOLHDL 3 07/09/2024     Orders: -     Lipid panel; Future -     Hepatic function panel; Future -     Basic metabolic panel with GFR; Future  Hyperglycemia Assessment & Plan: Low carb diet and exercise. Follow met b and A1c.  Lab Results  Component Value Date   HGBA1C 6.0 07/09/2024     Orders: -     Hemoglobin A1c; Future  SVT (supraventricular tachycardia) Assessment & Plan: Cotninue coreg . Stable.    Status post reverse total shoulder replacement, right Assessment & Plan: S/p shoulder surgery. Completed PT. Doing well.    NSVT (nonsustained  ventricular tachycardia) (HCC) Assessment & Plan: On coreg .  No increased heart rate or palpitations.  Follow.    Other migraine without status migrainosus, not intractable Assessment & Plan: Stable.  Follow.    History of colonic polyps Assessment & Plan: Colonoscopy 02/2017.  Recommended f/u in 5 years.  Had f/u colonoscopy 02/21/21.    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HCC) Assessment & Plan: Continues on coreg  and lasix . No evidence of volume overload on exam. Follow metabolic panel. Monitor weight.    Chronic systolic heart failure (HCC) Assessment & Plan: Continue coreg  and lasix  as she is doingl. No evidence of volume overload on exam. Breathing stable.    Neck pain Assessment & Plan: Tylenol  as directed. Gentle strestches/posture. Follow.    Other orders -     Rosuvastatin  Calcium ; Take 1 tablet (10 mg total) by mouth daily.  Dispense: 90 tablet; Refill: 3     Allena Hamilton, MD

## 2024-07-14 DIAGNOSIS — L570 Actinic keratosis: Secondary | ICD-10-CM | POA: Diagnosis not present

## 2024-07-20 ENCOUNTER — Encounter: Payer: Self-pay | Admitting: Internal Medicine

## 2024-07-20 NOTE — Assessment & Plan Note (Signed)
 Cotninue coreg . Stable.

## 2024-07-20 NOTE — Assessment & Plan Note (Signed)
 Tylenol  as directed. Gentle strestches/posture. Follow.

## 2024-07-20 NOTE — Assessment & Plan Note (Signed)
 Continues on coreg  and lasix . No evidence of volume overload on exam. Follow metabolic panel. Monitor weight.

## 2024-07-20 NOTE — Assessment & Plan Note (Signed)
Colonoscopy 02/2017.  Recommended f/u in 5 years.  Had f/u colonoscopy 02/21/21.  

## 2024-07-20 NOTE — Assessment & Plan Note (Signed)
 Continue coreg  and lasix  as she is doingl. No evidence of volume overload on exam. Breathing stable.

## 2024-07-20 NOTE — Assessment & Plan Note (Signed)
 On crestor .  Low cholesterol diet and exercise.  Follow lipid panel.  Lab Results  Component Value Date   CHOL 144 07/09/2024   HDL 52.80 07/09/2024   LDLCALC 71 07/09/2024   TRIG 100.0 07/09/2024   CHOLHDL 3 07/09/2024

## 2024-07-20 NOTE — Assessment & Plan Note (Signed)
On coreg.  No increased heart rate or palpitations.  Follow.  

## 2024-07-20 NOTE — Assessment & Plan Note (Signed)
 Low carb diet and exercise. Follow met b and A1c.  Lab Results  Component Value Date   HGBA1C 6.0 07/09/2024

## 2024-07-20 NOTE — Assessment & Plan Note (Signed)
Stable.  Follow.   

## 2024-07-20 NOTE — Assessment & Plan Note (Signed)
 Had f/u with endocrinology 02/27/24 - Recommended to continue methimazole  2.5mg  q day. Continue f/u with endocrinology.

## 2024-07-20 NOTE — Assessment & Plan Note (Signed)
 S/p shoulder surgery. Completed PT. Doing well.

## 2024-07-22 ENCOUNTER — Other Ambulatory Visit: Payer: Self-pay | Admitting: Internal Medicine

## 2024-07-22 DIAGNOSIS — H66002 Acute suppurative otitis media without spontaneous rupture of ear drum, left ear: Secondary | ICD-10-CM | POA: Diagnosis not present

## 2024-08-01 ENCOUNTER — Ambulatory Visit: Payer: Medicare Other | Admitting: Internal Medicine

## 2024-08-05 ENCOUNTER — Other Ambulatory Visit: Payer: Self-pay | Admitting: Cardiovascular Disease

## 2024-08-06 ENCOUNTER — Other Ambulatory Visit: Payer: Self-pay | Admitting: Internal Medicine

## 2024-08-06 DIAGNOSIS — E0590 Thyrotoxicosis, unspecified without thyrotoxic crisis or storm: Secondary | ICD-10-CM

## 2024-08-06 NOTE — Telephone Encounter (Signed)
°  Return in about 1 year (around 08/01/2024).

## 2024-11-10 ENCOUNTER — Ambulatory Visit: Admitting: Cardiovascular Disease

## 2024-11-12 ENCOUNTER — Other Ambulatory Visit

## 2024-11-14 ENCOUNTER — Ambulatory Visit: Admitting: Internal Medicine

## 2024-11-24 ENCOUNTER — Ambulatory Visit
